# Patient Record
Sex: Female | Born: 1955 | Race: White | Hispanic: No | Marital: Married | Smoking: Former smoker
Health system: Southern US, Community
[De-identification: ages and names within clinical notes are randomized; demographics above are authoritative.]

## PROBLEM LIST (undated history)

## (undated) DIAGNOSIS — R51 Headache: Secondary | ICD-10-CM

## (undated) DIAGNOSIS — I739 Peripheral vascular disease, unspecified: Secondary | ICD-10-CM

## (undated) DIAGNOSIS — I700 Atherosclerosis of aorta: Secondary | ICD-10-CM

## (undated) DIAGNOSIS — K219 Gastro-esophageal reflux disease without esophagitis: Secondary | ICD-10-CM

## (undated) DIAGNOSIS — I2510 Atherosclerotic heart disease of native coronary artery without angina pectoris: Secondary | ICD-10-CM

## (undated) DIAGNOSIS — E785 Hyperlipidemia, unspecified: Secondary | ICD-10-CM

## (undated) DIAGNOSIS — F17200 Nicotine dependence, unspecified, uncomplicated: Secondary | ICD-10-CM

## (undated) DIAGNOSIS — M1990 Unspecified osteoarthritis, unspecified site: Secondary | ICD-10-CM

## (undated) DIAGNOSIS — E1159 Type 2 diabetes mellitus with other circulatory complications: Secondary | ICD-10-CM

## (undated) DIAGNOSIS — K5790 Diverticulosis of intestine, part unspecified, without perforation or abscess without bleeding: Secondary | ICD-10-CM

## (undated) DIAGNOSIS — J449 Chronic obstructive pulmonary disease, unspecified: Secondary | ICD-10-CM

## (undated) DIAGNOSIS — R519 Headache, unspecified: Secondary | ICD-10-CM

## (undated) DIAGNOSIS — J45909 Unspecified asthma, uncomplicated: Secondary | ICD-10-CM

## (undated) DIAGNOSIS — I701 Atherosclerosis of renal artery: Secondary | ICD-10-CM

## (undated) DIAGNOSIS — K859 Acute pancreatitis without necrosis or infection, unspecified: Secondary | ICD-10-CM

## (undated) DIAGNOSIS — T8859XA Other complications of anesthesia, initial encounter: Secondary | ICD-10-CM

## (undated) DIAGNOSIS — I10 Essential (primary) hypertension: Secondary | ICD-10-CM

## (undated) DIAGNOSIS — T4145XA Adverse effect of unspecified anesthetic, initial encounter: Secondary | ICD-10-CM

## (undated) HISTORY — DX: Unspecified osteoarthritis, unspecified site: M19.90

## (undated) HISTORY — DX: Chronic obstructive pulmonary disease, unspecified: J44.9

## (undated) HISTORY — DX: Hyperlipidemia, unspecified: E78.5

## (undated) HISTORY — DX: Diverticulosis of intestine, part unspecified, without perforation or abscess without bleeding: K57.90

## (undated) HISTORY — DX: Atherosclerosis of aorta: I70.0

## (undated) HISTORY — PX: CARDIAC CATHETERIZATION: SHX172

## (undated) HISTORY — DX: Atherosclerosis of renal artery: I70.1

## (undated) HISTORY — DX: Type 2 diabetes mellitus with other circulatory complications: E11.59

## (undated) HISTORY — DX: Acute pancreatitis without necrosis or infection, unspecified: K85.90

## (undated) HISTORY — PX: FOOT SURGERY: SHX648

## (undated) HISTORY — DX: Peripheral vascular disease, unspecified: I73.9

## (undated) HISTORY — DX: Atherosclerotic heart disease of native coronary artery without angina pectoris: I25.10

## (undated) HISTORY — DX: Nicotine dependence, unspecified, uncomplicated: F17.200

---

## 1978-02-14 HISTORY — PX: TUBAL LIGATION: SHX77

## 1997-11-05 ENCOUNTER — Emergency Department (HOSPITAL_COMMUNITY): Admission: EM | Admit: 1997-11-05 | Discharge: 1997-11-05 | Payer: Self-pay | Admitting: Emergency Medicine

## 1998-05-15 ENCOUNTER — Ambulatory Visit (HOSPITAL_COMMUNITY): Admission: RE | Admit: 1998-05-15 | Discharge: 1998-05-15 | Payer: Self-pay | Admitting: Family Medicine

## 1998-05-15 ENCOUNTER — Encounter: Payer: Self-pay | Admitting: Family Medicine

## 1998-05-28 ENCOUNTER — Ambulatory Visit (HOSPITAL_COMMUNITY): Admission: RE | Admit: 1998-05-28 | Discharge: 1998-05-28 | Payer: Self-pay | Admitting: Family Medicine

## 1998-05-28 ENCOUNTER — Encounter: Payer: Self-pay | Admitting: Family Medicine

## 1999-02-25 ENCOUNTER — Ambulatory Visit (HOSPITAL_COMMUNITY): Admission: RE | Admit: 1999-02-25 | Discharge: 1999-02-25 | Payer: Self-pay | Admitting: Internal Medicine

## 1999-02-25 ENCOUNTER — Encounter: Payer: Self-pay | Admitting: Internal Medicine

## 1999-10-28 ENCOUNTER — Inpatient Hospital Stay (HOSPITAL_COMMUNITY): Admission: EM | Admit: 1999-10-28 | Discharge: 1999-11-01 | Payer: Self-pay | Admitting: Emergency Medicine

## 1999-10-29 ENCOUNTER — Encounter: Payer: Self-pay | Admitting: Internal Medicine

## 2000-05-23 ENCOUNTER — Inpatient Hospital Stay (HOSPITAL_COMMUNITY): Admission: EM | Admit: 2000-05-23 | Discharge: 2000-05-27 | Payer: Self-pay | Admitting: Emergency Medicine

## 2000-05-23 ENCOUNTER — Encounter: Payer: Self-pay | Admitting: Emergency Medicine

## 2001-02-14 DIAGNOSIS — K859 Acute pancreatitis without necrosis or infection, unspecified: Secondary | ICD-10-CM

## 2001-02-14 HISTORY — DX: Acute pancreatitis without necrosis or infection, unspecified: K85.90

## 2001-04-30 ENCOUNTER — Emergency Department (HOSPITAL_COMMUNITY): Admission: EM | Admit: 2001-04-30 | Discharge: 2001-04-30 | Payer: Self-pay | Admitting: Emergency Medicine

## 2001-06-19 ENCOUNTER — Emergency Department (HOSPITAL_COMMUNITY): Admission: EM | Admit: 2001-06-19 | Discharge: 2001-06-19 | Payer: Self-pay | Admitting: Emergency Medicine

## 2001-08-25 ENCOUNTER — Emergency Department (HOSPITAL_COMMUNITY): Admission: EM | Admit: 2001-08-25 | Discharge: 2001-08-25 | Payer: Self-pay | Admitting: Emergency Medicine

## 2001-09-15 ENCOUNTER — Emergency Department (HOSPITAL_COMMUNITY): Admission: EM | Admit: 2001-09-15 | Discharge: 2001-09-16 | Payer: Self-pay

## 2002-01-14 ENCOUNTER — Encounter: Payer: Self-pay | Admitting: Emergency Medicine

## 2002-01-14 ENCOUNTER — Emergency Department (HOSPITAL_COMMUNITY): Admission: EM | Admit: 2002-01-14 | Discharge: 2002-01-14 | Payer: Self-pay | Admitting: Emergency Medicine

## 2002-01-15 ENCOUNTER — Inpatient Hospital Stay (HOSPITAL_COMMUNITY): Admission: EM | Admit: 2002-01-15 | Discharge: 2002-01-16 | Payer: Self-pay | Admitting: Emergency Medicine

## 2002-01-16 ENCOUNTER — Encounter: Payer: Self-pay | Admitting: Internal Medicine

## 2005-01-01 ENCOUNTER — Emergency Department (HOSPITAL_COMMUNITY): Admission: EM | Admit: 2005-01-01 | Discharge: 2005-01-01 | Payer: Self-pay | Admitting: Emergency Medicine

## 2005-04-11 ENCOUNTER — Emergency Department (HOSPITAL_COMMUNITY): Admission: EM | Admit: 2005-04-11 | Discharge: 2005-04-12 | Payer: Self-pay | Admitting: Emergency Medicine

## 2006-11-22 ENCOUNTER — Ambulatory Visit: Payer: Self-pay | Admitting: Family Medicine

## 2006-11-22 ENCOUNTER — Encounter (INDEPENDENT_AMBULATORY_CARE_PROVIDER_SITE_OTHER): Payer: Self-pay | Admitting: Nurse Practitioner

## 2006-11-22 LAB — CONVERTED CEMR LAB
BUN: 12 mg/dL (ref 6–23)
CO2: 23 meq/L (ref 19–32)
Calcium: 9.9 mg/dL (ref 8.4–10.5)
Chloride: 100 meq/L (ref 96–112)
Cholesterol: 380 mg/dL — ABNORMAL HIGH (ref 0–200)
Creatinine, Ser: 0.66 mg/dL (ref 0.40–1.20)
HDL: 44 mg/dL (ref >39)
Hemoglobin: 16.8 g/dL — ABNORMAL HIGH (ref 12.0–15.0)
Lymphocytes Relative: 34 % (ref 12–46)
MCHC: 33.5 g/dL (ref 30.0–36.0)
Monocytes Absolute: 0.3 10*3/uL (ref 0.2–0.7)
Monocytes Relative: 4 % (ref 3–11)
Neutro Abs: 4.2 10*3/uL (ref 1.7–7.7)
RBC: 5.6 M/uL — ABNORMAL HIGH (ref 3.87–5.11)
Total CHOL/HDL Ratio: 8.6

## 2006-11-23 ENCOUNTER — Ambulatory Visit: Payer: Self-pay | Admitting: *Deleted

## 2006-11-24 ENCOUNTER — Encounter (INDEPENDENT_AMBULATORY_CARE_PROVIDER_SITE_OTHER): Payer: Self-pay | Admitting: Nurse Practitioner

## 2006-11-29 ENCOUNTER — Ambulatory Visit (HOSPITAL_COMMUNITY): Admission: RE | Admit: 2006-11-29 | Discharge: 2006-11-29 | Payer: Self-pay | Admitting: Nurse Practitioner

## 2006-11-29 ENCOUNTER — Ambulatory Visit (HOSPITAL_COMMUNITY): Admission: RE | Admit: 2006-11-29 | Discharge: 2006-11-29 | Payer: Self-pay | Admitting: Family Medicine

## 2006-12-05 ENCOUNTER — Encounter: Admission: RE | Admit: 2006-12-05 | Discharge: 2006-12-05 | Payer: Self-pay | Admitting: Family Medicine

## 2007-03-20 ENCOUNTER — Ambulatory Visit: Payer: Self-pay | Admitting: Family Medicine

## 2007-05-10 ENCOUNTER — Ambulatory Visit: Payer: Self-pay | Admitting: Internal Medicine

## 2007-08-22 ENCOUNTER — Ambulatory Visit: Payer: Self-pay | Admitting: Internal Medicine

## 2007-08-22 LAB — CONVERTED CEMR LAB
CRP: 0.2 mg/dL (ref <0.6)
HDL: 44 mg/dL (ref >39)
LDL Cholesterol: 156 mg/dL — ABNORMAL HIGH (ref 0–99)
Total CHOL/HDL Ratio: 5.6

## 2007-10-02 ENCOUNTER — Ambulatory Visit: Payer: Self-pay | Admitting: Family Medicine

## 2007-11-30 ENCOUNTER — Ambulatory Visit: Payer: Self-pay | Admitting: Internal Medicine

## 2008-01-08 ENCOUNTER — Encounter (INDEPENDENT_AMBULATORY_CARE_PROVIDER_SITE_OTHER): Payer: Self-pay | Admitting: Internal Medicine

## 2008-01-08 ENCOUNTER — Ambulatory Visit: Payer: Self-pay | Admitting: Internal Medicine

## 2008-01-08 LAB — CONVERTED CEMR LAB
AST: 17 units/L (ref 0–37)
BUN: 18 mg/dL (ref 6–23)
Calcium: 9.8 mg/dL (ref 8.4–10.5)
Chloride: 103 meq/L (ref 96–112)
Creatinine, Ser: 0.75 mg/dL (ref 0.40–1.20)
HDL: 42 mg/dL (ref >39)
Total CHOL/HDL Ratio: 5

## 2008-02-13 ENCOUNTER — Ambulatory Visit (HOSPITAL_COMMUNITY): Admission: RE | Admit: 2008-02-13 | Discharge: 2008-02-13 | Payer: Self-pay | Admitting: Internal Medicine

## 2008-05-02 ENCOUNTER — Ambulatory Visit: Payer: Self-pay | Admitting: Family Medicine

## 2008-07-08 ENCOUNTER — Ambulatory Visit: Payer: Self-pay | Admitting: Family Medicine

## 2008-09-24 ENCOUNTER — Ambulatory Visit: Payer: Self-pay | Admitting: Internal Medicine

## 2008-09-24 ENCOUNTER — Encounter (INDEPENDENT_AMBULATORY_CARE_PROVIDER_SITE_OTHER): Payer: Self-pay | Admitting: Internal Medicine

## 2008-09-24 LAB — CONVERTED CEMR LAB
Alkaline Phosphatase: 77 units/L (ref 39–117)
BUN: 26 mg/dL — ABNORMAL HIGH (ref 6–23)
Creatinine, Ser: 1.04 mg/dL (ref 0.40–1.20)
Glucose, Bld: 97 mg/dL (ref 70–99)
HDL: 41 mg/dL (ref >39)
LDL Cholesterol: 108 mg/dL — ABNORMAL HIGH (ref 0–99)
Sodium: 137 meq/L (ref 135–145)
Total Bilirubin: 0.2 mg/dL — ABNORMAL LOW (ref 0.3–1.2)
Total CHOL/HDL Ratio: 4.4
Total Protein: 7.1 g/dL (ref 6.0–8.3)
Triglycerides: 163 mg/dL — ABNORMAL HIGH (ref <150)
VLDL: 33 mg/dL (ref 0–40)

## 2008-09-25 ENCOUNTER — Ambulatory Visit: Payer: Self-pay | Admitting: Internal Medicine

## 2008-09-25 ENCOUNTER — Encounter (INDEPENDENT_AMBULATORY_CARE_PROVIDER_SITE_OTHER): Payer: Self-pay | Admitting: Internal Medicine

## 2008-09-25 LAB — CONVERTED CEMR LAB
Calcium: 9.7 mg/dL (ref 8.4–10.5)
Creatinine, Ser: 1 mg/dL (ref 0.40–1.20)

## 2008-12-08 ENCOUNTER — Encounter: Payer: Self-pay | Admitting: Cardiology

## 2008-12-08 ENCOUNTER — Encounter (INDEPENDENT_AMBULATORY_CARE_PROVIDER_SITE_OTHER): Payer: Self-pay | Admitting: Internal Medicine

## 2008-12-08 ENCOUNTER — Ambulatory Visit: Payer: Self-pay | Admitting: Internal Medicine

## 2008-12-08 LAB — CONVERTED CEMR LAB
Chloride: 102 meq/L (ref 96–112)
Cholesterol: 182 mg/dL (ref 0–200)
HDL: 47 mg/dL (ref >39)
LDL Cholesterol: 98 mg/dL (ref 0–99)
Potassium: 5.2 meq/L (ref 3.5–5.3)
Sodium: 136 meq/L (ref 135–145)
Triglycerides: 187 mg/dL — ABNORMAL HIGH (ref <150)
VLDL: 37 mg/dL (ref 0–40)

## 2008-12-09 ENCOUNTER — Encounter (INDEPENDENT_AMBULATORY_CARE_PROVIDER_SITE_OTHER): Payer: Self-pay | Admitting: Internal Medicine

## 2009-01-02 DIAGNOSIS — I10 Essential (primary) hypertension: Secondary | ICD-10-CM | POA: Insufficient documentation

## 2009-01-02 DIAGNOSIS — R61 Generalized hyperhidrosis: Secondary | ICD-10-CM | POA: Insufficient documentation

## 2009-01-02 DIAGNOSIS — R079 Chest pain, unspecified: Secondary | ICD-10-CM | POA: Insufficient documentation

## 2009-01-02 DIAGNOSIS — E119 Type 2 diabetes mellitus without complications: Secondary | ICD-10-CM | POA: Insufficient documentation

## 2009-01-02 DIAGNOSIS — F17200 Nicotine dependence, unspecified, uncomplicated: Secondary | ICD-10-CM | POA: Insufficient documentation

## 2009-01-05 ENCOUNTER — Ambulatory Visit: Payer: Self-pay | Admitting: Cardiology

## 2009-01-05 DIAGNOSIS — R0602 Shortness of breath: Secondary | ICD-10-CM | POA: Insufficient documentation

## 2009-01-20 ENCOUNTER — Telehealth (INDEPENDENT_AMBULATORY_CARE_PROVIDER_SITE_OTHER): Payer: Self-pay | Admitting: *Deleted

## 2009-01-21 ENCOUNTER — Encounter: Payer: Self-pay | Admitting: Cardiology

## 2009-01-21 ENCOUNTER — Ambulatory Visit: Payer: Self-pay | Admitting: Cardiology

## 2009-01-21 ENCOUNTER — Ambulatory Visit: Payer: Self-pay

## 2009-01-21 ENCOUNTER — Encounter (HOSPITAL_COMMUNITY): Admission: RE | Admit: 2009-01-21 | Discharge: 2009-02-11 | Payer: Self-pay | Admitting: Cardiovascular Disease

## 2009-01-30 ENCOUNTER — Ambulatory Visit: Payer: Self-pay | Admitting: Cardiology

## 2009-01-30 DIAGNOSIS — I739 Peripheral vascular disease, unspecified: Secondary | ICD-10-CM | POA: Insufficient documentation

## 2009-02-03 ENCOUNTER — Encounter: Payer: Self-pay | Admitting: Cardiology

## 2009-02-04 ENCOUNTER — Telehealth: Payer: Self-pay | Admitting: Cardiology

## 2009-02-11 ENCOUNTER — Ambulatory Visit: Payer: Self-pay | Admitting: Cardiology

## 2009-02-12 LAB — CONVERTED CEMR LAB
BUN: 17 mg/dL (ref 6–23)
Basophils Absolute: 0.1 10*3/uL (ref 0.0–0.1)
Creatinine, Ser: 1 mg/dL (ref 0.4–1.2)
Eosinophils Relative: 7.3 % — ABNORMAL HIGH (ref 0.0–5.0)
GFR calc non Af Amer: 61.91 mL/min (ref >60)
Glucose, Bld: 79 mg/dL (ref 70–99)
INR: 0.9 (ref 0.8–1.0)
Lymphs Abs: 2.9 10*3/uL (ref 0.7–4.0)
MCV: 90.9 fL (ref 78.0–100.0)
Monocytes Absolute: 0.6 10*3/uL (ref 0.1–1.0)
Neutrophils Relative %: 49.5 % (ref 43.0–77.0)
Platelets: 423 10*3/uL — ABNORMAL HIGH (ref 150.0–400.0)
Prothrombin Time: 9.3 s (ref 9.1–11.7)
RDW: 13.2 % (ref 11.5–14.6)
WBC: 8.2 10*3/uL (ref 4.5–10.5)

## 2009-02-14 HISTORY — PX: TOTAL KNEE ARTHROPLASTY: SHX125

## 2009-02-18 ENCOUNTER — Telehealth: Payer: Self-pay | Admitting: Cardiology

## 2009-02-18 ENCOUNTER — Ambulatory Visit (HOSPITAL_COMMUNITY): Admission: AD | Admit: 2009-02-18 | Discharge: 2009-02-18 | Payer: Self-pay | Admitting: Cardiology

## 2009-02-18 ENCOUNTER — Ambulatory Visit: Payer: Self-pay | Admitting: Cardiology

## 2009-02-23 ENCOUNTER — Ambulatory Visit: Payer: Self-pay | Admitting: Cardiology

## 2009-02-23 ENCOUNTER — Ambulatory Visit: Payer: Self-pay | Admitting: Cardiovascular Disease

## 2009-02-23 DIAGNOSIS — I739 Peripheral vascular disease, unspecified: Secondary | ICD-10-CM | POA: Insufficient documentation

## 2009-02-26 LAB — CONVERTED CEMR LAB
BUN: 18 mg/dL (ref 6–23)
Calcium: 9.7 mg/dL (ref 8.4–10.5)
Chloride: 105 meq/L (ref 96–112)
Creatinine, Ser: 0.9 mg/dL (ref 0.4–1.2)
GFR calc non Af Amer: 69.91 mL/min (ref >60)

## 2009-03-02 ENCOUNTER — Ambulatory Visit: Payer: Self-pay | Admitting: Cardiology

## 2009-03-02 DIAGNOSIS — I2510 Atherosclerotic heart disease of native coronary artery without angina pectoris: Secondary | ICD-10-CM | POA: Insufficient documentation

## 2009-03-03 ENCOUNTER — Telehealth (INDEPENDENT_AMBULATORY_CARE_PROVIDER_SITE_OTHER): Payer: Self-pay | Admitting: *Deleted

## 2009-03-03 LAB — CONVERTED CEMR LAB
Basophils Absolute: 0.1 10*3/uL (ref 0.0–0.1)
Basophils Relative: 1.7 % (ref 0.0–3.0)
Calcium: 9.8 mg/dL (ref 8.4–10.5)
Eosinophils Relative: 8.7 % — ABNORMAL HIGH (ref 0.0–5.0)
GFR calc non Af Amer: 80.08 mL/min (ref >60)
Glucose, Bld: 109 mg/dL — ABNORMAL HIGH (ref 70–99)
HCT: 37.5 % (ref 36.0–46.0)
Hemoglobin: 12.5 g/dL (ref 12.0–15.0)
INR: 0.9 (ref 0.8–1.0)
Lymphocytes Relative: 31.9 % (ref 12.0–46.0)
Lymphs Abs: 2.1 10*3/uL (ref 0.7–4.0)
Monocytes Relative: 6.8 % (ref 3.0–12.0)
Neutro Abs: 3.3 10*3/uL (ref 1.4–7.7)
Potassium: 5.4 meq/L — ABNORMAL HIGH (ref 3.5–5.1)
RBC: 4.14 M/uL (ref 3.87–5.11)
RDW: 12.4 % (ref 11.5–14.6)
Sodium: 142 meq/L (ref 135–145)
WBC: 6.5 10*3/uL (ref 4.5–10.5)

## 2009-03-10 ENCOUNTER — Ambulatory Visit: Payer: Self-pay | Admitting: Internal Medicine

## 2009-03-11 ENCOUNTER — Ambulatory Visit (HOSPITAL_COMMUNITY): Admission: RE | Admit: 2009-03-11 | Discharge: 2009-03-11 | Payer: Self-pay | Admitting: Cardiovascular Disease

## 2009-03-11 ENCOUNTER — Ambulatory Visit: Payer: Self-pay | Admitting: Cardiovascular Disease

## 2009-03-16 ENCOUNTER — Ambulatory Visit: Payer: Self-pay | Admitting: Cardiology

## 2009-03-16 ENCOUNTER — Encounter: Payer: Self-pay | Admitting: Cardiovascular Disease

## 2009-03-18 LAB — CONVERTED CEMR LAB
CO2: 24 meq/L (ref 19–32)
Calcium: 10 mg/dL (ref 8.4–10.5)
Glucose, Bld: 128 mg/dL — ABNORMAL HIGH (ref 70–99)
Potassium: 5 meq/L (ref 3.5–5.1)
Sodium: 138 meq/L (ref 135–145)

## 2009-03-23 ENCOUNTER — Ambulatory Visit: Payer: Self-pay | Admitting: Cardiovascular Disease

## 2009-03-23 LAB — CONVERTED CEMR LAB
BUN: 22 mg/dL (ref 6–23)
Basophils Relative: 1.4 % (ref 0.0–3.0)
CO2: 27 meq/L (ref 19–32)
Chloride: 107 meq/L (ref 96–112)
Eosinophils Absolute: 0.7 10*3/uL (ref 0.0–0.7)
Eosinophils Relative: 9 % — ABNORMAL HIGH (ref 0.0–5.0)
HCT: 35.9 % — ABNORMAL LOW (ref 36.0–46.0)
Lymphs Abs: 2.3 10*3/uL (ref 0.7–4.0)
MCHC: 33.4 g/dL (ref 30.0–36.0)
MCV: 90.7 fL (ref 78.0–100.0)
Monocytes Absolute: 0.6 10*3/uL (ref 0.1–1.0)
Platelets: 372 10*3/uL (ref 150.0–400.0)
Potassium: 4.8 meq/L (ref 3.5–5.1)
RBC: 3.96 M/uL (ref 3.87–5.11)
WBC: 7.9 10*3/uL (ref 4.5–10.5)

## 2009-03-25 ENCOUNTER — Inpatient Hospital Stay (HOSPITAL_COMMUNITY): Admission: AD | Admit: 2009-03-25 | Discharge: 2009-03-27 | Payer: Self-pay | Admitting: Cardiovascular Disease

## 2009-03-25 ENCOUNTER — Ambulatory Visit: Payer: Self-pay | Admitting: Cardiovascular Disease

## 2009-03-26 ENCOUNTER — Encounter: Payer: Self-pay | Admitting: Cardiovascular Disease

## 2009-04-03 ENCOUNTER — Telehealth: Payer: Self-pay | Admitting: Cardiology

## 2009-04-06 ENCOUNTER — Encounter: Payer: Self-pay | Admitting: Cardiology

## 2009-04-09 ENCOUNTER — Encounter: Payer: Self-pay | Admitting: Cardiovascular Disease

## 2009-04-10 ENCOUNTER — Ambulatory Visit: Payer: Self-pay

## 2009-04-10 ENCOUNTER — Encounter: Payer: Self-pay | Admitting: Cardiovascular Disease

## 2009-04-13 ENCOUNTER — Telehealth: Payer: Self-pay | Admitting: Cardiology

## 2009-04-13 ENCOUNTER — Ambulatory Visit: Payer: Self-pay | Admitting: Cardiovascular Disease

## 2009-04-16 ENCOUNTER — Ambulatory Visit: Payer: Self-pay | Admitting: Cardiology

## 2009-04-22 LAB — CONVERTED CEMR LAB
Albumin: 3.9 g/dL (ref 3.5–5.2)
HDL: 49.9 mg/dL (ref >39.00)
LDL Cholesterol: 61 mg/dL (ref 0–99)
Total CHOL/HDL Ratio: 3
Triglycerides: 97 mg/dL (ref 0.0–149.0)
VLDL: 19.4 mg/dL (ref 0.0–40.0)

## 2009-04-28 ENCOUNTER — Encounter: Payer: Self-pay | Admitting: Cardiovascular Disease

## 2009-05-15 ENCOUNTER — Telehealth (INDEPENDENT_AMBULATORY_CARE_PROVIDER_SITE_OTHER): Payer: Self-pay | Admitting: *Deleted

## 2009-05-18 ENCOUNTER — Telehealth: Payer: Self-pay | Admitting: Cardiology

## 2009-06-01 ENCOUNTER — Ambulatory Visit: Payer: Self-pay | Admitting: Cardiology

## 2009-06-12 ENCOUNTER — Encounter: Payer: Self-pay | Admitting: Cardiology

## 2009-06-15 ENCOUNTER — Telehealth: Payer: Self-pay | Admitting: Cardiology

## 2009-06-17 ENCOUNTER — Ambulatory Visit (HOSPITAL_COMMUNITY): Admission: RE | Admit: 2009-06-17 | Discharge: 2009-06-17 | Payer: Self-pay | Admitting: Physician Assistant

## 2009-06-23 ENCOUNTER — Telehealth: Payer: Self-pay | Admitting: Cardiology

## 2009-07-15 ENCOUNTER — Inpatient Hospital Stay (HOSPITAL_COMMUNITY): Admission: RE | Admit: 2009-07-15 | Discharge: 2009-07-18 | Payer: Self-pay | Admitting: Orthopedic Surgery

## 2009-08-11 ENCOUNTER — Encounter
Admission: RE | Admit: 2009-08-11 | Discharge: 2009-09-10 | Payer: Self-pay | Source: Home / Self Care | Admitting: Orthopedic Surgery

## 2009-08-14 ENCOUNTER — Encounter: Admission: RE | Admit: 2009-08-14 | Discharge: 2009-10-06 | Payer: Self-pay | Admitting: Orthopedic Surgery

## 2009-08-28 ENCOUNTER — Telehealth: Payer: Self-pay | Admitting: Cardiology

## 2009-08-31 ENCOUNTER — Telehealth: Payer: Self-pay | Admitting: Cardiology

## 2009-09-14 ENCOUNTER — Ambulatory Visit: Payer: Self-pay | Admitting: Internal Medicine

## 2009-10-13 ENCOUNTER — Encounter: Payer: Self-pay | Admitting: Cardiovascular Disease

## 2009-10-14 ENCOUNTER — Ambulatory Visit: Payer: Self-pay | Admitting: Cardiovascular Disease

## 2009-10-15 ENCOUNTER — Ambulatory Visit: Payer: Self-pay | Admitting: Cardiovascular Disease

## 2009-11-27 ENCOUNTER — Encounter (INDEPENDENT_AMBULATORY_CARE_PROVIDER_SITE_OTHER): Payer: Self-pay | Admitting: *Deleted

## 2009-11-27 LAB — CONVERTED CEMR LAB
INR: 1.01 (ref <1.50)
Prothrombin Time: 13.5 s (ref 11.6–15.2)

## 2009-12-02 ENCOUNTER — Encounter: Payer: Self-pay | Admitting: Cardiology

## 2009-12-02 ENCOUNTER — Ambulatory Visit: Payer: Self-pay | Admitting: Cardiology

## 2009-12-03 ENCOUNTER — Ambulatory Visit (HOSPITAL_COMMUNITY): Admission: RE | Admit: 2009-12-03 | Discharge: 2009-12-03 | Payer: Self-pay | Admitting: Family Medicine

## 2009-12-07 LAB — CONVERTED CEMR LAB
Bilirubin, Direct: 0 mg/dL (ref 0.0–0.3)
Total Bilirubin: 0.2 mg/dL — ABNORMAL LOW (ref 0.3–1.2)
Total Protein: 7.1 g/dL (ref 6.0–8.3)

## 2010-03-16 NOTE — Progress Notes (Signed)
Summary: returning call regarding call about meds  Phone Note Call from Patient Call back at Home Phone [phone]   Caller: Mom Summary of Call: Pt returning call regarding meds Initial call taken by: Judie Grieve,  August 31, 2009 2:37 PM  Follow-up for Phone Call        SEE PREVIOUS NOTE  FROM DR [facility]  SAMPLES OF CRESTOR LEFT AT FRONT DESK FOR PICK UP PT AWARE. Follow-up by: Scherrie Bateman, LPN,  August 31, 2009 2:58 PM

## 2010-03-16 NOTE — Assessment & Plan Note (Signed)
Summary: rov   Primary Provider:  Tyler Deis Id-Din   History of Present Illness: 55 yo with history of HTN, DM2, hyperlipidemia and smoking presents for followup of chest pain and right leg pain after recent left heart cath.  Patient has had sharp left-sided chest pain around twice monthly for the last 2 years.  It has been somewhat more frequent recently.  Pain is not exertional.  Patient also has pain her right leg, primarily the calf, simply with walking in her house.  She also gets pain in her right foot at night.  No ulcers on the foot.  She additionally has right knee pain.  Her left leg does not bother her.  She has been able to quit smoking using Chantix but is off Chantix now and having more urges.  She also says that her exertional shortness of breath is improved with Advair use.   She had a stress myoview showing apical ischemia so I did a left heart cath.  This showed diffuse branch vessel disease, the worst being in a large third diagonal.  This vessel extended down to near the apex and may have caused the defect on myoview.  The vessel had severe diffuse disease and no target for intervention so we are managing medically.  She has had minimal CP since the procedure.  I have started her on Imdur 30 mg daily in addition to her other meds.  She will be going for peripheral angiogram with Dr. Clifton James.   She has chronic headaches and takes a lot of Goody's Powder.    BP is elevated to 141/111  Labs (1/11): creatinine 0.9, K 4.5  ECG: NSR at 108, otherwise normal  Current Medications (verified): 1)  Metformin Hcl 500 Mg Tabs (Metformin Hcl) .... Take One Tablet Two Times A Day 2)  Naproxen 500 Mg Tabs (Naproxen) .... 2 Tablets Daily 3)  Lisinopril-Hydrochlorothiazide 20-12.5 Mg Tabs (Lisinopril-Hydrochlorothiazide) .... Take Two Tablets Daily 4)  Crestor 20 Mg Tabs (Rosuvastatin Calcium) .... One Tablet Daily 5)  Hydrocodone-Acetaminophen 5-500 Mg Tabs (Hydrocodone-Acetaminophen) .... Take  One Tablet Every 6 Hours As Needed 6)  Aspirin 81 Mg Tabs (Aspirin) .... Once Daily 7)  Fish Oil 1000 Mg Caps (Omega-3 Fatty Acids) .... Take One Capsule Once Daily 8)  Advair Diskus 250-50 Mcg/dose Aepb (Fluticasone-Salmeterol) .... Use 1 Puff Twice A Day 9)  Plavix 75 Mg Tabs (Clopidogrel Bisulfate) .... Four  Tablets  Daily For 1 Day The One Tablet Daily 10)  Imdur 30 Mg Xr24h-Tab (Isosorbide Mononitrate) .... One Tablet Daily 11)  Ventolin Hfa 108 (90 Base) Mcg/act Aers (Albuterol Sulfate) .... Two Times A Day 12)  Tramadol Hcl 50 Mg Tabs (Tramadol Hcl) .... One Tablet Twice A Day As Needed For Pain  Allergies (verified): 1)  ! * Tetanus 2)  ! * Latex 3)  ! * Milk  Past History:  Past Medical History: 1. CAD: ETT-myoview (12/10) with apical anterior ischemia, EF 75%.  LHC (1/11) showed diffuse disease.  There was a large D3 with serial 70%, 80-90%, and 70% stenoses then severe diffuse distal vessel disease.  Small to moderate OM1 with severe diffuse disease, 50% mRCA, 95% mid small PLV, 60% PDA.  EF 60%.  2. DYSLIPIDEMIA (ICD-272.4) 3. HYPERTENSION (ICD-401.9) 4. DIABETES MELLITUS, TYPE II (ICD-250.00) 5. TOBACCO ABUSE (ICD-305.1): Quit 11/10.  6. Severe osteoarthritis right knee 7. Asthma/COPD 8. PAD: Right leg claudication with arterial dopplers (12/10) showing >50% right SFA stenosis.     Family History: Reviewed history  from 02/23/2009 and no changes required. Mother alive with CVA Father deceased with emphysema  Social History: Reviewed history from 02/23/2009 and no changes required. No etoh-used to drink. Denies any IV drug use or marijuana use.   Former smoker for 30 years, quit November 2010. She is married with two children. Disabled from knee, not working.    Review of Systems       All systems reviewed and negative except as per HPI.   Vital Signs:  Patient profile:   55 year old female Height:      62.5 inches Weight:      162 pounds BMI:      29.26 Pulse rate:   108 / minute Resp:     16 per minute BP sitting:   141 / 111  (right arm)  Vitals Entered By: Marrion Coy, CNA (March 02, 2009 8:05 AM)  Physical Exam  General:  Well developed, well nourished, in no acute distress. Neck:  Neck supple, no JVD. No masses, thyromegaly or abnormal cervical nodes. Lungs:  Somewhat distant breath sounds, no wheezes.  Heart:  Non-displaced PMI, chest non-tender; regular rate and rhythm, S1, S2 without murmurs, rubs or gallops. Carotid upstroke normal, no bruit. Unable to palpate right PT pulses but foot is warm without ulcers.  The right DP pulse is 1+.  Left pedal pulses normal. No edema, no varicosities. Abdomen:  Bowel sounds positive; abdomen soft and non-tender without masses, organomegaly, or hernias noted. No hepatosplenomegaly. Extremities:  No clubbing or cyanosis. Neurologic:  Alert and oriented x 3. Psych:  Normal affect.   Impression & Recommendations:  Problem # 1:  CAD (ICD-414.00) Patient has diffuse branch vessel disease on catheterization.  I suspect that the apical ischemia on myoview was related to the severely diseased large 3rd diagonal that extends down toward the apex along with the distal LAD.  There was no good target for intervention given diffuse nature of disease.  I will manage her CAD medically.  She is now on Imdur and has not had significant CP since the cath.  She is not on a beta blocker because of history of asthma/COPD though in the future I will probably try to initiate bisoprolol.  Continue ACEI, Crestor, ASA, and Plavix.    Problem # 2:  PVD (ICD-443.9) For peripheral angiogram with Dr. Clifton James.   Problem # 3:  HYPERTENSION (ICD-401.9) Increase lisinopril/HCT to 40/25 daily, BMET and BP check in 2 wks.    Problem # 4:  DYSLIPIDEMIA (ICD-272.4) Goal LDL < 70.  Followup lipids/LFTs on Crestor in 2 months.   Problem # 5:  TOBACCO ABUSE (ICD-305.1) She is staying off cigarettes but getting more  urges.  Will give her another course of Chantix.   I will see her back in 3 months.   I will give her a prescription for tramadol to use instead of Goody Powders for her headaches.   Patient Instructions: 1)  Your physician has recommended you make the following change in your medication:  2)  Increase Lisinopril/HCT to 40/25mg  daily---this will be two Lisinopril /HCT 20/12.5mg  tablets daily 3)  Chantix 1mg  daily for 5 days the one twice a day 4)  Use Tramadol 50mg  twice a day as needed for pain--DO NOT USE GOODY POWDER 5)  Take and record your blood pressure and pulse---I will call you in 2 weeks to get the readings Luana Shu [phone]  6)  Your physician recommends that you return for lab work in: 2 weeks--BMP  414.01 401.9 7)  Your physician recommends that you return for a FASTING lipid profile/liver profile in 2 months 414.01 401.9 8)  Your physician recommends that you schedule a follow-up appointment in: 3 months with Dr. Marca Ancona  Prescriptions: TRAMADOL HCL 50 MG TABS (TRAMADOL HCL) one tablet twice a day as needed for pain  #30 x 3   Entered by:   Katina Dung, RN, BSN   Authorized by:   Marca Ancona, MD   Signed by:   Katina Dung, RN, BSN on 03/02/2009   Method used:   Faxed to ...       [facility] - [facility] (retail)       [address] [place], [place]  [address]       Ph: [phone]       Fax: [phone]   RxID:   [id number] LISINOPRIL-HYDROCHLOROTHIAZIDE 20-12.5 MG TABS (LISINOPRIL-HYDROCHLOROTHIAZIDE) take two tablets daily  #60 x 6   Entered by:   Katina Dung, RN, BSN   Authorized by:   Marca Ancona, MD   Signed by:   Katina Dung, RN, BSN on 03/02/2009   Method used:   Faxed to ...       [facility] - [facility] (retail)       [address] [place], [place]  [address]       Ph: [phone]       Fax: [phone]   RxID:   [id number]

## 2010-03-16 NOTE — Progress Notes (Signed)
Summary: B/P readings  Phone Note Outgoing Call   Call placed by: Katina Dung, RN, BSN,  April 03, 2009 3:09 PM Call placed to: Patient Summary of Call: B/P readings  Follow-up for Phone Call        called pt to get B/P readings--pt had PV procedure 03-25-09   --she said she thought it was OK while she was in the hospital but she has not checked it --she has an appointment with Dr Clifton James 04-13-09, she will get her B/P checked and bring the readings to the appointment with Dr Clifton James

## 2010-03-16 NOTE — Progress Notes (Signed)
Summary: B/P readings -review B/P readings 06-18-2009  Phone Note Outgoing Call   Call placed by: Katina Dung, RN, BSN,  Jun 15, 2009 9:21 AM Call placed to: Patient Summary of Call: check on B/P  Follow-up for Phone Call        Lisinopril/HCT stopped 06-01-09 Lisinopril 20mg  and Bisoprolol 2.5mg  started  called pt to get B/P readings since medication changes were made --NA Katina Dung, RN, BSN  Jun 15, 2009 1:58 PM   Additional Follow-up for Phone Call Additional follow up Details #1::        talked with pt by telephone--pt states B/P a little lower since making medication changes 06-01-09-she states they are averaging 97-98/60-71--she denies lightheadedness or dizziness--will forward to Dr Shirlee Latch for review

## 2010-03-16 NOTE — Letter (Signed)
Summary: [facility] Clear[facility]   [facility] Clear[facility]   Imported By: Roderic Ovens 06/12/2009 12:01:47  _____________________________________________________________________  External Attachment:    Type:   Image     Comment:   External Document

## 2010-03-16 NOTE — Assessment & Plan Note (Signed)
Summary: 6 month follow up/443.9/pla   Visit Type:  6 mo f/u Primary [name]:  [name]  CC:  Right knee pain.  History of Present Illness: 55 yo WF with h/o PVD, CAD,  DM, HTN, hyperlipidemia and tobacco abuse  who is here today for PV follow up.  She is seen by Dr. Shirlee Latch for her cardiac issues. She was initially seen in January 2011 and described pain and cramping in her right calf with walking. No rest pain or ulcerations. Her right knee has been a chronic issue and hurts with minimal ambulation. She has been told that she should have this replaced. She underwent lower extremity arterial dopplers that suggested at least moderate disease in the right mid SFA. I performed a distal aortogram with bilateral lower extremity runoff on 03/11/09. She was found to have severe disease in the mid right SFA. I brought her back on 03/25/09 for planned TurboHawk atherectomy of the right SFA. She is here today for follow up. ABI post procedure of 0.85 on the right and 0.99 on the left. She had her right knee replaced in June 2011. She tells me that she has had severe pain in her knee since then. She has not been happy with her replacement. Her right calf and ankle hurt occasionally. This seems to be improved overall since the atherectomy of her mid right SFA in February 2011. ABI today stable (see below). No rest pain in her calf muscles. Her toes occasionally tingle in the right foot.      Current Medications (verified): 1)  Metformin Hcl 500 Mg Tabs (Metformin Hcl) .... Take One Tablet Two Times A Day 2)  Naproxen 500 Mg Tabs (Naproxen) .... 2 Tablets Daily 3)  Lisinopril 20 Mg Tabs (Lisinopril) .... One Tablet Daily 4)  Crestor 20 Mg Tabs (Rosuvastatin Calcium) .... One Tablet Daily 5)  Hydrocodone-Acetaminophen 5-500 Mg Tabs (Hydrocodone-Acetaminophen) .... Take One Tablet Every 6 Hours As Needed 6)  Aspirin Ec 325 Mg Tbec (Aspirin) .... Take One Tablet By Mouth Daily 7)  Fish Oil 1000 Mg Caps  (Omega-3 Fatty Acids) .... 2 Caps Once Daily 8)  Advair Diskus 250-50 Mcg/dose Aepb (Fluticasone-Salmeterol) .... Use 1 Puff Twice A Day 9)  Plavix 75 Mg Tabs (Clopidogrel Bisulfate) .... One Tablet Daily 10)  Imdur 30 Mg Xr24h-Tab (Isosorbide Mononitrate) .... One Tablet Daily 11)  Ventolin Hfa 108 (90 Base) Mcg/act Aers (Albuterol Sulfate) .... Two Times A Day Prn 12)  Tramadol Hcl 50 Mg Tabs (Tramadol Hcl) .... One Tablet Twice A Day As Needed For Pain 13)  Bisoprolol Fumarate 5 Mg Tabs (Bisoprolol Fumarate) .... One- Half Tablet Daily  Allergies: 1)  ! * Tetanus 2)  ! * Latex 3)  ! * Milk  Past History:  Past Medical History: Reviewed history from 06/01/2009 and no changes required. 1. CAD: ETT-myoview (12/10) with apical anterior ischemia, EF 75%.  LHC (1/11) showed diffuse disease.  There was a large D3 with serial 70%, 80-90%, and 70% stenoses then severe diffuse distal vessel disease.  Small to moderate OM1 with severe diffuse disease, 50% mRCA, 95% mid small PLV, 60% PDA.  EF 60%.  Patient managed medically, no interventional options.   2. DYSLIPIDEMIA (ICD-272.4) 3. HYPERTENSION (ICD-401.9) 4. DIABETES MELLITUS, TYPE II (ICD-250.00) 5. TOBACCO ABUSE (ICD-305.1): Quit 11/10.  6. Severe osteoarthritis right knee 7. Asthma/COPD 8. PAD: Severe stenosis mid right SFA s/p atherectomy 03/25/09. Moderate disease diffusely throughout the iliacs, bilateral SFA, below knee vessels.  ABIs post-procedure  were 0.85 on right and 0.99 on left.     Social History: Reviewed history from 03/02/2009 and no changes required. No etoh-used to drink. Denies any IV drug use or marijuana use.   Former smoker for 30 years, quit November 2010. She is married with two children. Disabled from knee, not working.    Review of Systems  The patient denies fatigue, malaise, fever, weight gain/loss, vision loss, decreased hearing, hoarseness, chest pain, palpitations, shortness of breath, prolonged cough,  wheezing, sleep apnea, coughing up blood, abdominal pain, blood in stool, nausea, vomiting, diarrhea, heartburn, incontinence, blood in urine, muscle weakness, joint pain, leg swelling, rash, skin lesions, headache, fainting, dizziness, depression, anxiety, enlarged lymph nodes, easy bruising or bleeding, and environmental allergies.         Joint pain-see HPI  Vital Signs:  Patient profile:   55 year old female Height:      62 inches Weight:      172 pounds BMI:     31.57 Pulse rate:   92 / minute Pulse rhythm:   irregular BP sitting:   92 / 60  (left arm) Cuff size:   large  Vitals Entered By: Danielle Rankin, CMA (October 15, 2009 3:52 PM)  Physical Exam  General:  General: Well developed, well nourished, NAD Musculoskeletal: Muscle strength 5/5 all ext Psychiatric: Mood and affect normal Neck: No JVD, no carotid bruits, no thyromegaly, no lymphadenopathy. Lungs:Clear bilaterally, no wheezes, rhonci, crackles CV: RRR no murmurs, gallops rubs Abdomen: soft, NT, ND, BS present Extremities: No edema, pulses 2+ left DP/PT.  1+ right DP/PT.    ABI's  Procedure date:  10/14/2009  Findings:      Right ABI 0.88, Left ABI 1.2. Patent mid right SFA post atherectomy. Bilateral ATA and PTA waveforms biphasic.   Impression & Recommendations:  Problem # 1:  PVD (ICD-443.9) ABI stable. She is difficult in regards to symptoms since her right leg constantly aches after the knee replacement. We will follow her clinically as best we can with non-invasive imaging every 6 months. Repeat ABI in 6 months. Continue ASA and Plavix.   Patient Instructions: 1)  Your physician recommends that you schedule a follow-up appointment in: 6 months 2)  Your physician recommends that you continue on your current medications as directed. Please refer to the Current Medication list given to you today. 3)  Your physician has requested that you have an ankle brachial index (ABI) in 6 months. During this test  an ultrasound and blood pressure cuff are used to evaluate the arteries that supply the arms and legs with blood. Allow thirty minutes for this exam. There are no restrictions or special instructions.

## 2010-03-16 NOTE — Miscellaneous (Signed)
Summary: Orders Update  Clinical Lists Changes  Orders: Added new Test order of Arterial Duplex Lower Extremity (Arterial Duplex Low) - Signed 

## 2010-03-16 NOTE — Assessment & Plan Note (Signed)
Summary: f75m   Visit Type:  Follow-up Primary Provider:  Dr. Mayford Knife, [facility]  CC:  chest pain on and off.  History of Present Illness: 55 yo WF with h/o PVD, CAD,  DM, HTN, and hyperlipidemia is here for followup.  Since last appointment, she had a right TKR in 6/11.  She continues to have pain in the right knee but is walking better.  She has had no exertional chest pain.  She gets occasional achy central chest pain radiating to her back (twice a week on average) that is not related to exertion, lasts about 5 minutes, and resolves spontaneously.  She has been riding a stationary bike for exercise with no dyspnea.  She has been doing well regarding claudication: she is now getting minimal claudication-type pain though she has considerable orthopedic pain.  She is not smoking.   ECG: NSR, normal    Current Medications (verified): 1)  Metformin Hcl 500 Mg Tabs (Metformin Hcl) .... Take One Tablet Two Times A Day 2)  Lisinopril 20 Mg Tabs (Lisinopril) .... One Tablet Daily 3)  Crestor 20 Mg Tabs (Rosuvastatin Calcium) .... One Tablet Daily 4)  Hydrocodone-Acetaminophen 5-500 Mg Tabs (Hydrocodone-Acetaminophen) .... Take One Tablet Every 6 Hours As Needed 5)  Aspirin 81 Mg Tbec (Aspirin) .... Take One Tablet By Mouth Daily 6)  Fish Oil 1000 Mg Caps (Omega-3 Fatty Acids) .... 2 Caps Once Daily 7)  Advair Diskus 250-50 Mcg/dose Aepb (Fluticasone-Salmeterol) .... Use 1 Puff Twice A Day 8)  Plavix 75 Mg Tabs (Clopidogrel Bisulfate) .... One Tablet Daily 9)  Imdur 30 Mg Xr24h-Tab (Isosorbide Mononitrate) .... One Tablet Daily 10)  Ventolin Hfa 108 (90 Base) Mcg/act Aers (Albuterol Sulfate) .... Two Times A Day Prn 11)  Bisoprolol Fumarate 5 Mg Tabs (Bisoprolol Fumarate) .... One- Half Tablet Daily 12)  Multivitamins   Tabs (Multiple Vitamin) .... Once Daily  Allergies (verified): 1)  ! * Tetanus 2)  ! * Latex 3)  ! * Milk  Past History:  Past Medical History: 1. CAD: ETT-myoview  (12/10) with apical anterior ischemia, EF 75%.  LHC (1/11) showed diffuse disease.  There was a large D3 with serial 70%, 80-90%, and 70% stenoses then severe diffuse distal vessel disease.  Small to moderate OM1 with severe diffuse disease, 50% mRCA, 95% mid small PLV, 60% PDA.  EF 60%.  Patient managed medically, no interventional options.   2. DYSLIPIDEMIA (ICD-272.4) 3. HYPERTENSION (ICD-401.9) 4. DIABETES MELLITUS, TYPE II (ICD-250.00) 5. TOBACCO ABUSE (ICD-305.1): Quit 11/10.  6. Severe osteoarthritis right knee, now s/p R TKR.  7. Asthma/COPD 8. PAD: Severe stenosis mid right SFA s/p atherectomy 03/25/09. Moderate disease diffusely throughout the iliacs, bilateral SFA, below knee vessels.  ABIs post-procedure were 0.85 on right and 0.99 on left.  Repeat ABIs (8/11): 0.88 right, 1.2 left.     Family History: Reviewed history from 02/23/2009 and no changes required. Mother alive with CVA Father deceased with emphysema  Social History: Reviewed history from 03/02/2009 and no changes required. No etoh-used to drink. Denies any IV drug use or marijuana use.   Former smoker for 30 years, quit November 2010. She is married with two children. Disabled from knee, not working.    Review of Systems       All systems reviewed and negative except as per HPI.   Vital Signs:  Patient profile:   55 year old female Height:      62 inches Weight:      174 pounds BMI:  31.94 Pulse rate:   79 / minute BP sitting:   112 / 70  (left arm) Cuff size:   regular  Vitals Entered By: Hardin Negus, RMA (December 02, 2009 9:58 AM)  Physical Exam  General:  Well developed, well nourished, in no acute distress. Neck:  Neck supple, no JVD. No masses, thyromegaly or abnormal cervical nodes. Lungs:  Clear bilaterally to auscultation and percussion. Heart:  Non-displaced PMI, chest non-tender; regular rate and rhythm, S1, S2 without murmurs, rubs or gallops. Carotid upstroke normal, no bruit. PT  pulses 2+ bilaterally. No edema, no varicosities. Abdomen:  Bowel sounds positive; abdomen soft and non-tender without masses, organomegaly, or hernias noted. No hepatosplenomegaly. Extremities:  No clubbing or cyanosis. Neurologic:  Alert and oriented x 3. Psych:  Normal affect.   Impression & Recommendations:  Problem # 1:  CAD (ICD-414.00) Patient has diffuse branch vessel disease on catheterization.  I suspect that the apical ischemia on myoview was related to the severely diseased large 3rd diagonal that extends down toward the apex along with the distal LAD.  There was no good target for intervention given diffuse nature of disease.  I have been managing her CAD medically.  Continue ACEI, Crestor, ASA, bisoprolol, and Plavix.  She has had no exertional chest pain, but has episodes of atypical pain.  I will increase her Imdur to 60 mg daily to see if this makes any difference in the chest pain pattern.   Problem # 2:  PVD (ICD-443.9) ABI stable. Follows with Dr. Clifton James. Continue ASA and Plavix.   Problem # 3:  DYSLIPIDEMIA (ICD-272.4) Needs lipids/LFTs, goal LDL < 70.   Other Orders: TLB-Hepatic/Liver Function Pnl (80076-HEPATIC)  Patient Instructions: 1)  Your physician recommends that you schedule a follow-up appointment in: 4 months 2)  Your physician recommends that you return for lab work in:--today--LFT's 3)  Your physician has recommended you make the following change in your medication: increase imdur to 60mg  everyday Prescriptions: IMDUR 60 MG XR24H-TAB (ISOSORBIDE MONONITRATE) take 1 tablet everyday  #30 x 10   Entered by:   Ledon Snare, RN   Authorized by:   Marca Ancona, MD   Signed by:   Ledon Snare, RN on 12/02/2009   Method used:   Faxed to ...       [facility] - [facility] (retail)       [address] [place], [place]  [address]       Ph: [phone]       Fax: [phone]   RxID:   [id number]

## 2010-03-16 NOTE — Letter (Signed)
Summary: Peripheral Vascular  [facility], Main Office  [address]   [place], [place] [address]   Phone: [phone]  Fax: [phone]     03/16/2009 MRN: [id number]  [name] [address] [place], [place]  [address]  Dear Ms. Ankney,   You are scheduled for Peripheral Vascular Angiogram on   March 25, 2009            with Dr.  Clifton James.            Please arrive at the [facility] at    9:30   a.m. on the day of your procedure.  1. DIET     __x__ Nothing to eat or drink after midnight except your medications with a sip of water.  2. Come to the [place] office on     March 23, 2009        for lab work.  The lab at [facility] is open from 8:30 a.m. to 1:30 p.m. and 2:30 p.m. to 5:00 p.m.  The lab at [facility] is open from 7:30 a.m. to 5:30 p.m.  You do not have to be fasting.  3. MAKE SURE YOU TAKE YOUR ASPIRIN.  4. __x___ DO NOT TAKE these medications before your procedure:         Do not take metformin the evening before procedure and the morning of the procedure and for 48 hours after the procedure.       __x__ YOU MAY TAKE ALL of your remaining medications with a small amount of water.      ____ START NEW medications:     ________________________________________________________________________________      ____ Eilene Ghazi instructions:     ________________________________________________________________________________  5. Plan for one night stay - bring personal belongings (i.e. toothpaste, toothbrush, etc.)  6. Bring a current list of your medications and current insurance cards.  7. Must have a responsible person to drive you home.   8. Someone must be with yu for the first 24 hours after you arrive home.  9. Please wear clothes that are easy to get on and off and wear slip-on shoes.  *Special note: Every effort is made to have your procedure done on time.  Occasionally  there are emergencies that present themselves at the hospital that may cause delays.  Please be patient if a delay does occur.  If you have any questions after you get home, please call the office at the number listed above.   Dossie Arbour, RN, BSN  Appended Document: Peripheral Vascular mailed to pt on 03/16/09

## 2010-03-16 NOTE — Letter (Signed)
Summary: Dental Services Medical Clearance  Dental Services Medical Clearance   Imported By: Roderic Ovens 05/21/2009 14:47:36  _____________________________________________________________________  External Attachment:    Type:   Image     Comment:   External Document

## 2010-03-16 NOTE — Progress Notes (Signed)
Summary: post cath instructions  Phone Note Other Incoming   Caller: Dr Shirlee Latch  Summary of Call: post cath instructions Initial call taken by: Katina Dung, RN, BSN,  February 18, 2009 2:39 PM  Follow-up for Phone Call        verbal order from Dr Pierce Crane Imdur 30mg  daily,start Plavix 75mg  two times a day for 1 day then 75mg  daily--follow-up appt in 2 weeks Katina Dung, RN, BSN  February 18, 2009 2:41 PM     New/Updated Medications: PLAVIX 75 MG TABS (CLOPIDOGREL BISULFATE) four  tablets  daily for 1 day the one tablet daily IMDUR 30 MG XR24H-TAB (ISOSORBIDE MONONITRATE) one tablet daily Prescriptions: PLAVIX 75 MG TABS (CLOPIDOGREL BISULFATE) four  tablets  daily for 1 day the one tablet daily  #30 x 3   Entered by:   Katina Dung, RN, BSN   Authorized by:   Marca Ancona, MD   Signed by:   Katina Dung, RN, BSN on 02/18/2009   Method used:   Faxed to ...       [facility] (retail)       [address] [place], [place]  [address]       Ph: [phone]       Fax: [phone]   RxID:   [id number] IMDUR 30 MG XR24H-TAB (ISOSORBIDE MONONITRATE) one tablet daily  #30 x 6   Entered by:   Katina Dung, RN, BSN   Authorized by:   Marca Ancona, MD   Signed by:   Katina Dung, RN, BSN on 02/18/2009   Method used:   Faxed to ...       [facility] (retail)       [address] [place], [place]  [address]       Ph: [phone]       Fax: [phone]   RxID:   [id number]   Appended Document: post cath instructions Plavix instructions given to pt 02-18-09 were to take Plavix 75mg  four tablets for 1 day then one tablet daily per Dr Shirlee Latch

## 2010-03-16 NOTE — Assessment & Plan Note (Signed)
Summary: 3 month rov/sl   Primary Provider:  Tyler Deis Id-Din  CC:  scheduled for right knee replacement by Dr Darrelyn Hillock 07-15-09.  History of Present Illness: 55 yo with history of CAD, PAD, HTN, DM presents for followup.  She had atherectomy and PTCA of mid SFA stenosis by Dr. Clifton James.  Her right calf claudication has decreased significantly since that time.  Her ABIs post-procedure showed R 0.85, L 0.99.  She has had no chest pain.  She is staying off cigarettes.  She is walking some for exercise and is able to walk on flat ground without significant COPD. She has been scheduled for right total knee replacement with Dr. Darrelyn Hillock in 6/11.  SBP is running 100s-110s at home.  No syncope/presyncope.   Labs (3/11): LDL 61, HDL 50, LFTs normal  Current Medications (verified): 1)  Metformin Hcl 500 Mg Tabs (Metformin Hcl) .... Take One Tablet Two Times A Day 2)  Naproxen 500 Mg Tabs (Naproxen) .... 2 Tablets Daily 3)  Lisinopril-Hydrochlorothiazide 20-12.5 Mg Tabs (Lisinopril-Hydrochlorothiazide) .... Take One Tablet  Daily 4)  Crestor 20 Mg Tabs (Rosuvastatin Calcium) .... One Tablet Daily 5)  Hydrocodone-Acetaminophen 5-500 Mg Tabs (Hydrocodone-Acetaminophen) .... Take One Tablet Every 6 Hours As Needed 6)  Aspirin 81 Mg Tabs (Aspirin) .... Once Daily 7)  Fish Oil 1000 Mg Caps (Omega-3 Fatty Acids) .... 2 Caps Once Daily 8)  Advair Diskus 250-50 Mcg/dose Aepb (Fluticasone-Salmeterol) .... Use 1 Puff Twice A Day 9)  Plavix 75 Mg Tabs (Clopidogrel Bisulfate) .... One Tablet Daily 10)  Imdur 30 Mg Xr24h-Tab (Isosorbide Mononitrate) .... One Tablet Daily 11)  Ventolin Hfa 108 (90 Base) Mcg/act Aers (Albuterol Sulfate) .... Two Times A Day Prn 12)  Tramadol Hcl 50 Mg Tabs (Tramadol Hcl) .... One Tablet Twice A Day As Needed For Pain  Allergies: 1)  ! * Tetanus 2)  ! * Latex 3)  ! * Milk  Past History:  Past Medical History: 1. CAD: ETT-myoview (12/10) with apical anterior ischemia, EF 75%.  LHC  (1/11) showed diffuse disease.  There was a large D3 with serial 70%, 80-90%, and 70% stenoses then severe diffuse distal vessel disease.  Small to moderate OM1 with severe diffuse disease, 50% mRCA, 95% mid small PLV, 60% PDA.  EF 60%.  Patient managed medically, no interventional options.   2. DYSLIPIDEMIA (ICD-272.4) 3. HYPERTENSION (ICD-401.9) 4. DIABETES MELLITUS, TYPE II (ICD-250.00) 5. TOBACCO ABUSE (ICD-305.1): Quit 11/10.  6. Severe osteoarthritis right knee 7. Asthma/COPD 8. PAD: Severe stenosis mid right SFA s/p atherectomy 03/25/09. Moderate disease diffusely throughout the iliacs, bilateral SFA, below knee vessels.  ABIs post-procedure were 0.85 on right and 0.99 on left.     Family History: Reviewed history from 02/23/2009 and no changes required. Mother alive with CVA Father deceased with emphysema  Social History: Reviewed history from 03/02/2009 and no changes required. No etoh-used to drink. Denies any IV drug use or marijuana use.   Former smoker for 30 years, quit November 2010. She is married with two children. Disabled from knee, not working.    Vital Signs:  Patient profile:   55 year old female Height:      62 inches Weight:      167.50 pounds BP sitting:   106 / 74  (left arm) Cuff size:   large  Vitals Entered By: Katina Dung, RN, BSN (June 01, 2009 8:08 AM)  Physical Exam  General:  Well developed, well nourished, in no acute distress. Neck:  Neck supple,  no JVD. No masses, thyromegaly or abnormal cervical nodes. Lungs:  Somewhat distant breath sounds, no wheezes.  Heart:  Non-displaced PMI, chest non-tender; regular rate and rhythm, S1, S2 without murmurs, rubs or gallops. Carotid upstroke normal, no bruit. Unable to palpate right PT pulses but foot is warm without ulcers.  Left pedal pulses normal. No edema, no varicosities. Abdomen:  Bowel sounds positive; abdomen soft and non-tender without masses, organomegaly, or hernias noted. No  hepatosplenomegaly. Extremities:  No clubbing or cyanosis. Neurologic:  Alert and oriented x 3. Psych:  Normal affect.   Impression & Recommendations:  Problem # 1:  CAD (ICD-414.00) Patient has diffuse branch vessel disease on catheterization.  I suspect that the apical ischemia on myoview was related to the severely diseased large 3rd diagonal that extends down toward the apex along with the distal LAD.  There was no good target for intervention given diffuse nature of disease.  I will manage her CAD medically.  She is now on Imdur and has not had significant CP since the cath.  Continue ACEI, Crestor, ASA, and Plavix.  I am going to start a beta-1 specific beta blocker (history of asthma/COPD), bisoprolol 2.5 mg daily, today.    Problem # 2:  PVD (ICD-443.9) Symptoms improved post-PTCA.   Problem # 3:  DYSPNEA (ICD-786.05) Exertional shortness of breath.  I suspect that she has a component of COPD from her smoking and that this is the main cause of DOE.  She also carries the diagnosis of asthma from childhood and wheezes frequently.  She is breathing better on Advair.   Problem # 4:  DYSLIPIDEMIA (ICD-272.4) Lipids at goal when last checked.  Continue Crestor.   Problem # 5:  HYPERTENSION (ICD-401.9) SBP running 100s-110s.  I am going to stop HCTZ, she will take lisinopril 20 mg daily without HCTZ, since she is starting bisoprolol (to avoid too much BP decrease).   Problem # 6:  PREOPERATIVE EVALUATION No further cardiac testing is needed prior to knee replacement in June.  It will be ok to come off Plavix for 5 days before the procedure and restart it as soon as feasible after the procedure.  She should continue ASA 81 mg peri-operatively if possible.  She should continue bisoprolol peri-operatively.   Other Orders: EKG w/ Interpretation (93000)  Patient Instructions: 1)  Your physician has recommended you make the following change in your medication:  2)  Stop Lisinopril/HCT 3)   Start Lisinopril 20mg  daily 4)  Start Bisoprolol 2.5mg  daily--this will be one-half of a 5mg  tablet 5)  Your physician has requested that you regularly monitor and record your blood pressure readings at home.  Please use the same machine at the same time of day to check your readings and record them--I will call you in 2 weeks to get the readings. Luana Shu  6)  Your physician wants you to follow-up in: 6 months with Dr Shirlee Latch.   You will receive a reminder letter in the mail two months in advance. If you don't receive a letter, please call our office to schedule the follow-up appointment. Prescriptions: BISOPROLOL FUMARATE 5 MG TABS (BISOPROLOL FUMARATE) one- half tablet daily  #30 x 6   Entered by:   Katina Dung, RN, BSN   Authorized by:   Marca Ancona, MD   Signed by:   Katina Dung, RN, BSN on 06/01/2009   Method used:   Electronically to        [facility] [phone]* (retail)  [address]       [place], [place]  [address]       Ph: [phone]       Fax: [phone]   RxID:   [id number] LISINOPRIL 20 MG TABS (LISINOPRIL) one tablet daily  #30 x 6   Entered by:   Katina Dung, RN, BSN   Authorized by:   Marca Ancona, MD   Signed by:   Katina Dung, RN, BSN on 06/01/2009   Method used:   Electronically to        [facility] [phone]* (retail)       [address]       [place], [place]  [address]       Ph: [phone]       Fax: [phone]   RxID:   [id number]    Vital Signs:  Patient profile:   55 year old female Height:      62 inches Weight:      167.50 pounds BP sitting:   106 / 74  (left arm) Cuff size:   large  Vitals Entered By: Katina Dung, RN, BSN (June 01, 2009 8:08 AM)

## 2010-03-16 NOTE — Progress Notes (Signed)
Summary: refill med  Phone Note Refill Request Call back at Home Phone [phone] Message from:  Patient on Jun 23, 2009 3:02 PM  Refills Requested: Medication #1:  TRAMADOL HCL 50 MG TABS one tablet twice a day as needed for pain [facility] [address] [phone]   Method Requested: Fax to Local Pharmacy Initial call taken by: Lorne Skeens,  Jun 23, 2009 3:02 PM Caller: Patient Reason for Call: Talk to Nurse    Prescriptions: TRAMADOL HCL 50 MG TABS (TRAMADOL HCL) one tablet twice a day as needed for pain  #30 Each x 2   Entered by:   Judithe Modest CMA   Authorized by:   Marca Ancona, MD   Signed by:   Judithe Modest CMA on 06/23/2009   Method used:   [facility]ronically to        [facility]  [address] [phone][facility] (retail)       [address]       [place], [place]  [address]       Ph: [phone]       Fax: [phone]   RxID:   [id number]

## 2010-03-16 NOTE — Letter (Signed)
Summary: Peripheral Vascular  [facility], Main Office  [address]   [place], [place] [address]   Phone: [phone]  Fax: [phone]     02/23/2009 MRN: [id number]  [name] [address] [place], [place]  [address]  Dear [name],   You are scheduled for Peripheral Vascular Angiogram on   March 11, 2009            with Dr.  Clifton James          .  Please arrive at the [facility] at  11     a.m. on the day of your procedure.  1. DIET      May have clear liquid breakfast day of procedure.  Do not drink after 8 AM  2. Come to the [place] office on     March 02, 2009        for lab work.  The lab at [facility] is open from 8:30 a.m. to 1:30 p.m. and 2:30 p.m. to 5:00 p.m.  The lab at [facility] is open from 7:30 a.m. to 5:30 p.m.  You do not have to be fasting.  3. MAKE SURE YOU TAKE YOUR ASPIRIN.  4. ___x__ DO NOT TAKE these medications before your procedure:         ______Do not take metformin evening before procedure and morning of procedure. Do not take for 48 hours after procedure.__________________________________________________________________________      ___x_ YOU MAY TAKE ALL of your remaining medications with a small amount of water.      ____ START NEW medications:     ________________________________________________________________________________      ____ Eilene Ghazi instructions:     ________________________________________________________________________________  5. Plan for one night stay - bring personal belongings (i.e. toothpaste, toothbrush, etc.)  6. Bring a current list of your medications and current insurance cards.  7. Must have a responsible person to drive you home.   8. Someone must be with yu for the first 24 hours after you arrive home.  9. Please wear clothes that are easy to get on and off and wear slip-on shoes.  *Special note: Every effort is made  to have your procedure done on time.  Occasionally there are emergencies that present themselves at the hospital that may cause delays.  Please be patient if a delay does occur.  If you have any questions after you get home, please call the office at the number listed above.   Dossie Arbour, RN, BSN

## 2010-03-16 NOTE — Cardiovascular Report (Signed)
Summary: Pre-Peripheral Vascular Cath and intervention orders  Pre-Peripheral Vascular Cath and intervention orders   Imported By: Kassie Mends 03/23/2009 14:56:01  _____________________________________________________________________  External Attachment:    Type:   Image     Comment:   External Document

## 2010-03-16 NOTE — Cardiovascular Report (Signed)
Summary: Pre-Cath Orders  Pre-Cath Orders   Imported By: Marylou Mccoy 03/04/2009 10:11:57  _____________________________________________________________________  External Attachment:    Type:   Image     Comment:   External Document

## 2010-03-16 NOTE — Progress Notes (Signed)
Summary: questions re med  Phone Note Call from Patient   Caller: Patient Reason for Call: Talk to Nurse Summary of Call: pt has questions re med-pls call [phone] Initial call taken by: Glynda Jaeger,  August 28, 2009 12:10 PM  Follow-up for Phone Call        spoke w/pt she is in the process of reapplying for medicaid and healthserve and is going to have trouble getting meds before her appt at [facility] in Aug, she needs to get Crestor and Bisoprolol changed to something on the [facility] $4 list, will send to Dr Shirlee Latch for review, pt states she has enough of both to last her for another week Meredith Staggers, RN  August 28, 2009 1:00 PM      Appended Document: questions re med Bisoprolol should be on [facility] list.  Would have her come to office to get Crestor samples to use until she gets her [employer] card.   Appended Document: questions re med 08/31/09--1100am--called pt to give instructions about meds, no answer, noanswering machine--nt  Appended Document: questions re med LMTCB   Appended Document: questions re med see note from 08/31/09-samples of Crestor left at desk for pt

## 2010-03-16 NOTE — Assessment & Plan Note (Signed)
Summary: new 443.89/saf   Visit Type:  new pt visit Primary Provider:  Nur Id-Din  CC:  right leg pain.  History of Present Illness: 55 yo WF with CAD,  DM, HTN, hyperlipidemia and tobacco abuse (quit two months ago) who is here today for a PV visit. She is seen by Dr. Shirlee Latch for her cardiac issues. She describes pain and cramping in her right calf with walking. No rest pain or ulcerations. Her right knee has been a chronic issue and hurts with minimal ambulation. She has been told that she should have this replaced. She underwent lower extremity arterial dopplers that suggested at least moderate disease in the right mid SFA. Distal aortic angiogram with pelvic angiography suggested some iliac disease as well on the right. She has no other complaints.   Allergies: 1)  ! * Tetanus 2)  ! * Latex 3)  ! * Milk  Past History:  Past Medical History: Reviewed history from 01/30/2009 and no changes required. 1. CHEST PAIN (ICD-786.50): ETT-myoview (12/10) with apical anterior ischemia, EF 75%.  2. DYSLIPIDEMIA (ICD-272.4) 3. HYPERTENSION (ICD-401.9) 4. DIABETES MELLITUS, TYPE II (ICD-250.00) 5. TOBACCO ABUSE (ICD-305.1): Quit 11/10.  6. Severe osteoarthritis right knee 7. Asthma/COPD 8. PAD: Right leg claudication with arterial dopplers (12/10) showing >50% right SFA stenosis.     Past Surgical History: Reviewed history from 01/02/2009 and no changes required. Tubal ligation   Family History: Mother alive with CVA Father deceased with emphysema  Social History: Reviewed history from 01/30/2009 and no changes required. No etoh-used to drink.  Denies any IV drug use or marijuana use.   Former smoker for 30 years, quit November 2010.  She is married with two children.   Disabled from knee, not working.    Review of Systems       The patient complains of joint pain.  The patient denies fatigue, malaise, fever, weight gain/loss, vision loss, decreased hearing, hoarseness, chest  pain, palpitations, shortness of breath, prolonged cough, wheezing, sleep apnea, coughing up blood, abdominal pain, blood in stool, nausea, vomiting, diarrhea, heartburn, incontinence, blood in urine, muscle weakness, leg swelling, rash, skin lesions, headache, fainting, dizziness, depression, anxiety, enlarged lymph nodes, easy bruising or bleeding, and environmental allergies.         See HPI. Right leg claudication  Vital Signs:  Patient profile:   55 year old female Height:      62.5 inches Weight:      161 pounds BMI:     29.08 Pulse rate:   65 / minute BP sitting:   106 / 72  (left arm) Cuff size:   large  Vitals Entered By: Danielle Rankin, CMA (February 23, 2009 9:14 AM)  Physical Exam  General:  General: Well developed, well nourished, NAD HEENT: OP clear, mucus membranes moist SKIN: warm, dry Neuro: No focal deficits Musculoskeletal: Muscle strength 5/5 all ext Psychiatric: Mood and affect normal Neck: No JVD, no carotid bruits, no thyromegaly, no lymphadenopathy. Lungs:Clear bilaterally, no wheezes, rhonci, crackles CV: RRR no murmurs, gallops rubs Abdomen: soft, NT, ND, BS present Extremities: No edema, pulses 2+ left DP/PT. 1+ right  DP/PT. Sensation intact. No ulcerations.    Arterial Doppler  Procedure date:  01/21/2009  Findings:      >50% right mid SFA stenosis. Mild disease left SFA. Right ABI 0.87. Left ABI 1.0. Right PTA waveform damped and monophasic. Left PTA and DPA waveform brisk.   Impression & Recommendations:  Problem # 1:  PVD (ICD-443.9) Pt with  claudication in right lower extremity and suggestion of mid SFA stenosis on right. Will plan distal aortogram with bilateral lower extremity runoff with possible PTA on 03/11/09. Risks benefits explained. Pt agrees to proceed. Will check CBC, BMET, coags week of procedure.   Other Orders: T-2 View CXR (71020TC)  Patient Instructions: 1)  Your physician recommends that you schedule a follow-up appointment  in: 4 weeks 2)  Have lab work drawn on March 02, 2009 on day of appt with Dr. Shirlee Latch.  CBC, BMP, PT, PTT 443.9 3)  Your physician has requested that you have a peripheral vascular angiogram. This exam is performed at the hospital. During this exam IV contrast is used to look at arterial blood flow.  Please review the information sheet given for details.

## 2010-03-16 NOTE — Progress Notes (Signed)
Summary: refill  Phone Note Refill Request Message from:  Patient on May 18, 2009 9:45 AM  Refills Requested: Medication #1:  CRESTOR 20 MG TABS one tablet daily Send to [address] [phone]  Initial call taken by: Judie Grieve,  May 18, 2009 9:46 AM Caller: Patient  Follow-up for Phone Call       Follow-up by: Judithe Modest CMA,  May 18, 2009 10:49 AM    Prescriptions: CRESTOR 20 MG TABS (ROSUVASTATIN CALCIUM) one tablet daily  #30 x 6   Entered by:   Judithe Modest CMA   Authorized by:   Marca Ancona, MD   Signed by:   Judithe Modest CMA on 05/18/2009   Method used:   Electronically to        [facility] [phone]* (retail)       [address]       [place], [place]  [address]       Ph: [phone]       Fax: [phone]   RxID:   [id number] CRESTOR 20 MG TABS (ROSUVASTATIN CALCIUM) one tablet daily  #30 x 6   Entered by:   Judithe Modest CMA   Authorized by:   Marca Ancona, MD   Signed by:   Judithe Modest CMA on 05/18/2009   Method used:   Faxed to ...       [facility] (retail)       [address] [place], [place]  [address]       Ph: [phone]       Fax: [phone]   RxID:   [id number]

## 2010-03-16 NOTE — Progress Notes (Signed)
Summary: REFILL  Phone Note Refill Request Message from:  Patient on May 15, 2009 8:40 AM  Refills Requested: Medication #1:  CRESTOR 20 MG TABS one tablet daily [facility] [address] [phone], pt request call back [phone]  Initial call taken by: Judie Grieve,  May 15, 2009 8:40 AM Caller: Patient  Follow-up for Phone Call        Rx faxed to pharmacy [address] Follow-up by: Oswald Hillock,  May 15, 2009 3:31 PM    Prescriptions: CRESTOR 20 MG TABS (ROSUVASTATIN CALCIUM) one tablet daily  #30 x 6   Entered by:   Oswald Hillock   Authorized by:   Verne Carrow, MD   Signed by:   Oswald Hillock on 05/15/2009   Method used:   Electronically to        [facility] [phone]* (retail)       [address]       [place], [place]  [address]       Ph: [phone]       Fax: [phone]   RxID:   [id number]

## 2010-03-16 NOTE — Assessment & Plan Note (Signed)
Summary: eph/jml   Visit Type:  EPH Primary Provider:  Tyler Deis Id-Din  CC:  Bilateral leg pain with ambulation.  History of Present Illness: 55 yo WF with h/o PVD, CAD,  DM, HTN, hyperlipidemia and tobacco abuse (quit two months ago) who is here today for follow up after recent PV procedure.  She is seen by Dr. Shirlee Latch for her cardiac issues. She was initially seen in January and described pain and cramping in her right calf with walking. No rest pain or ulcerations. Her right knee has been a chronic issue and hurts with minimal ambulation. She has been told that she should have this replaced. She underwent lower extremity arterial dopplers that suggested at least moderate disease in the right mid SFA. I performed a distal aortogram with bilateral lower extremity runoff on 03/11/09. She was found to have severe disease in the mid right SFA. I brought her back on 03/25/09 for planned TurboHawk atherectomy of the right SFA. She is here today for follow up. ABI post procedure of 0.85 on the right and 0.99 on the left. She tells me that the pain in her right calf is better. She can walk further now without cramping, although she does still get some cramping when walking long distances. Her left leg also cramps when she walks long distances. Her right knee remains painful. She cannot get this replaced at this time without insurance. She is seen in [facility] clinic where she gets her meds.   Current Medications (verified): 1)  Metformin Hcl 500 Mg Tabs (Metformin Hcl) .... Take One Tablet Two Times A Day 2)  Naproxen 500 Mg Tabs (Naproxen) .... 2 Tablets Daily 3)  Lisinopril-Hydrochlorothiazide 20-12.5 Mg Tabs (Lisinopril-Hydrochlorothiazide) .... Take Two Tablets Daily 4)  Crestor 20 Mg Tabs (Rosuvastatin Calcium) .... One Tablet Daily 5)  Hydrocodone-Acetaminophen 5-500 Mg Tabs (Hydrocodone-Acetaminophen) .... Take One Tablet Every 6 Hours As Needed 6)  Aspirin 81 Mg Tabs (Aspirin) .... Once Daily 7)  Fish  Oil 1000 Mg Caps (Omega-3 Fatty Acids) .... 2 Caps Once Daily 8)  Advair Diskus 250-50 Mcg/dose Aepb (Fluticasone-Salmeterol) .... Use 1 Puff Twice A Day 9)  Plavix 75 Mg Tabs (Clopidogrel Bisulfate) .... Four  Tablets  Daily For 1 Day The One Tablet Daily 10)  Imdur 30 Mg Xr24h-Tab (Isosorbide Mononitrate) .... One Tablet Daily 11)  Ventolin Hfa 108 (90 Base) Mcg/act Aers (Albuterol Sulfate) .... Two Times A Day 12)  Tramadol Hcl 50 Mg Tabs (Tramadol Hcl) .... One Tablet Twice A Day As Needed For Pain  Allergies: 1)  ! * Tetanus 2)  ! * Latex 3)  ! * Milk  Past History:  Past Medical History: 1. CAD: ETT-myoview (12/10) with apical anterior ischemia, EF 75%.  LHC (1/11) showed diffuse disease.  There was a large D3 with serial 70%, 80-90%, and 70% stenoses then severe diffuse distal vessel disease.  Small to moderate OM1 with severe diffuse disease, 50% mRCA, 95% mid small PLV, 60% PDA.  EF 60%.  2. DYSLIPIDEMIA (ICD-272.4) 3. HYPERTENSION (ICD-401.9) 4. DIABETES MELLITUS, TYPE II (ICD-250.00) 5. TOBACCO ABUSE (ICD-305.1): Quit 11/10.  6. Severe osteoarthritis right knee 7. Asthma/COPD 8. PAD: Severe stenosis mid right SFA s/p atherectomy 03/25/09. Moderate disease diffusely throughout the iliacs, bilateral SFA, below knee vessels.     Social History: Reviewed history from 03/02/2009 and no changes required. No etoh-used to drink. Denies any IV drug use or marijuana use.   Former smoker for 30 years, quit November 2010. She is married  with two children. Disabled from knee, not working.    Review of Systems  The patient denies fatigue, malaise, fever, weight gain/loss, vision loss, decreased hearing, hoarseness, chest pain, palpitations, shortness of breath, prolonged cough, wheezing, sleep apnea, coughing up blood, abdominal pain, blood in stool, nausea, vomiting, diarrhea, heartburn, incontinence, blood in urine, muscle weakness, joint pain, leg swelling, rash, skin lesions,  headache, fainting, dizziness, depression, anxiety, enlarged lymph nodes, easy bruising or bleeding, and environmental allergies.         Lower extremity pain as described in the HPI.  Vital Signs:  Patient profile:   55 year old female Height:      62.5 inches Weight:      152 pounds Pulse rate:   108 / minute Pulse rhythm:   irregular BP sitting:   102 / 52  (left arm) Cuff size:   large  Vitals Entered By: Danielle Rankin, CMA (April 13, 2009 12:14 PM)  Physical Exam  General:  General: Well developed, well nourished, NAD HEENT: OP clear, mucus membranes moist Psychiatric: Mood and affect normal Neck: No JVD, no carotid bruits, no thyromegaly, no lymphadenopathy. Lungs:Clear bilaterally, no wheezes, rhonci, crackles CV: RRR no murmurs, gallops rubs Abdomen: soft, NT, ND, BS present Extremities: No edema, palpable left and right DP and PT pulses. Both feet are warm. No uclerations. Sensation intact.     Cardiac Cath  Procedure date:  03/25/2009  Findings:      DETAILS OF PROCEDURE:  The patient was brought to the peripheral vascular laboratory after signing informed consent for the procedure. The left groin was prepped and draped in a sterile fashion.  Lidocaine 1% was used for local anesthesia.  A 7-French sheath was inserted into the left femoral artery without difficulty.  We initially used a Glidewire to get around the bifurcation.  We then passed the Terumo Destination sheath around the aortic bifurcation down into the right common femoral artery.  At this point, I used a coronary wire to pass through the right superficial femoral artery and into the right popliteal artery.  We then passed a Spider FilterWire over the first wire.  The filter was deployed at the level of the knee.  We then made 6 runs with the TurboHawk atherectomy device in the more distal portion of the midvessel.  We pulled the device back and made 3 runs in the more proximal portion of the  midsegment.  There was an excellent result in the mid right superficial femoral artery.  Next I performed angiography below the knee and found what appeared to be vasospasm in the tibioperoneal trunk just before the bifurcation into the posterior tibial artery and peroneal artery.  IV nitroglycerin was given and there was some resolution.  It did appear that there was possibly a small dissection in this vessel.  It is possible that the wire did drift distally, although this was not seen on angiography.  There was flow into all 3 vessels at the conclusion of the case.  The patient had good distal pulses.  She was taken to the holding area in stable condition.   IMPRESSION:  Successful percutaneous transluminal angioplasty with atherectomy of the severe stenosis in the mid right superficial femoral artery.  ABI's  Procedure date:  04/10/2009  Findings:      Right 0.85. Left 0.99. Bilateral CFA and popliteal waveforms are brisk and triphasic. Left PTA and ATA waveforms brisk and triphasic. Right PTA and ATA waveforms broadened and biphasic.   Cardiac  Cath  Procedure date:  03/11/2009  Findings:      1. The right renal artery appeared to have mild ostial stenosis of 30-     40%. 2. The left renal artery had a 50% proximal stenosis. 3. The distal aorta had mild diffuse plaque disease of 20-30%.  There     was no aortic aneurysm noted. 4. The right common iliac artery, external iliac artery, femoral     artery had mild plaque disease.  The right proximal superficial     femoral artery had diffuse 30% plaque.  There were several areas in     the mid right SFA that had high-grade stenosis.  There was one area     at 70% followed by another area of 90% stenosis.  Distal right SFA     had 20-30% plaque.  The right popliteal artery had mild plaque     disease.  There was three-vessel runoff to the right foot. 5. The left common iliac artery had a 30% ostial lesion and a long     tubular  40% stenosis throughout the proximal portion of the vessel.     The left external iliac artery and left common femoral artery had     mild plaque.  The left superficial femoral artery had serial 30%     lesions throughout the proximal and mid portion.  The left     popliteal artery had plaque disease.  There was three-vessel runoff     into the left foot.  Impression & Recommendations:  Problem # 1:  PVD (ICD-443.9) S/P atherectomy mid right SFA on 03/25/09. Symptoms are at least mildly improved. I suspect that her right knee is causing her significant problems in addition to her vascular disease. ABI stable. Will repeat dopplers/ABI 6 months. She will continue ASA and Plavix for now. Continue other medications. She is to continue walking as much as possible on a daily basis. She is to alert [place] if there is any change in her clinical status. I have advised her to seek further opinions on her knee when her insurance issues are resolved.   Other Orders: EKG w/ Interpretation (93000)  Patient Instructions: 1)  Your physician recommends that you schedule a follow-up appointment in: 6 months 2)  Your physician has requested that you have a lower or upper extremity arterial duplex.  This test is an ultrasound of the arteries in the legs or arms.  It looks at arterial blood flow in the legs and arms.  Allow one hour for Lower and Upper Arterial scans. There are no restrictions or special instructions. To be done in 6 months

## 2010-03-16 NOTE — Progress Notes (Signed)
Summary: B/P readings  Phone Note Outgoing Call   Call placed by: Katina Dung, RN, BSN,  April 13, 2009 4:19 PM Call placed to: Patient Summary of Call: B/P readings  Follow-up for Phone Call        pt brought recent B/P readings to office today--Dr Shirlee Latch reviewed the readings today and recommended pt decrease Lisinopril/HCT to 20/12.5mg  daily--NA     New/Updated Medications: LISINOPRIL-HYDROCHLOROTHIAZIDE 20-12.5 MG TABS (LISINOPRIL-HYDROCHLOROTHIAZIDE) take one tablet  daily   Current Medications (verified): 1)  Metformin Hcl 500 Mg Tabs (Metformin Hcl) .... Take One Tablet Two Times A Day 2)  Naproxen 500 Mg Tabs (Naproxen) .... 2 Tablets Daily 3)  Lisinopril-Hydrochlorothiazide 20-12.5 Mg Tabs (Lisinopril-Hydrochlorothiazide) .... Take One Tablet  Daily 4)  Crestor 20 Mg Tabs (Rosuvastatin Calcium) .... One Tablet Daily 5)  Hydrocodone-Acetaminophen 5-500 Mg Tabs (Hydrocodone-Acetaminophen) .... Take One Tablet Every 6 Hours As Needed 6)  Aspirin 81 Mg Tabs (Aspirin) .... Once Daily 7)  Fish Oil 1000 Mg Caps (Omega-3 Fatty Acids) .... 2 Caps Once Daily 8)  Advair Diskus 250-50 Mcg/dose Aepb (Fluticasone-Salmeterol) .... Use 1 Puff Twice A Day 9)  Plavix 75 Mg Tabs (Clopidogrel Bisulfate) .... Four  Tablets  Daily For 1 Day The One Tablet Daily 10)  Imdur 30 Mg Xr24h-Tab (Isosorbide Mononitrate) .... One Tablet Daily 11)  Ventolin Hfa 108 (90 Base) Mcg/act Aers (Albuterol Sulfate) .... Two Times A Day 12)  Tramadol Hcl 50 Mg Tabs (Tramadol Hcl) .... One Tablet Twice A Day As Needed For Pain  Allergies: 1)  ! * Tetanus 2)  ! * Latex 3)  ! * Milk discussed with patient by telephone--she will decrease Lisinopril-HCT to 20-12.5mg  daily-she will continue to take and record her B/P

## 2010-03-16 NOTE — Progress Notes (Signed)
Summary: Patient's at Home Vitals  Patient's at Home Vitals   Imported By: Marylou Mccoy 04/22/2009 13:28:05  _____________________________________________________________________  External Attachment:    Type:   Image     Comment:   External Document

## 2010-03-16 NOTE — Progress Notes (Signed)
  Phone Note From Other Clinic   Caller: Jackie/[facility] Details for Reason: Pt.information Initial call taken by: Denny Peon    Faxed LOV over to [phone] [facility]  March 03, 2009 9:38 AM

## 2010-03-19 ENCOUNTER — Other Ambulatory Visit: Payer: Self-pay | Admitting: Family Medicine

## 2010-03-19 DIAGNOSIS — M25512 Pain in left shoulder: Secondary | ICD-10-CM

## 2010-03-21 ENCOUNTER — Ambulatory Visit
Admission: RE | Admit: 2010-03-21 | Discharge: 2010-03-21 | Disposition: A | Discharge status: Home / Self Care | Payer: Self-pay | Source: Ambulatory Visit | Attending: Family Medicine | Admitting: Family Medicine

## 2010-03-21 DIAGNOSIS — M25512 Pain in left shoulder: Secondary | ICD-10-CM

## 2010-03-22 ENCOUNTER — Ambulatory Visit: Payer: Self-pay | Attending: Family Medicine | Admitting: Physical Therapy

## 2010-03-22 DIAGNOSIS — M25619 Stiffness of unspecified shoulder, not elsewhere classified: Secondary | ICD-10-CM | POA: Insufficient documentation

## 2010-03-22 DIAGNOSIS — M25519 Pain in unspecified shoulder: Secondary | ICD-10-CM | POA: Insufficient documentation

## 2010-03-22 DIAGNOSIS — IMO0001 Reserved for inherently not codable concepts without codable children: Secondary | ICD-10-CM | POA: Insufficient documentation

## 2010-03-24 ENCOUNTER — Ambulatory Visit: Payer: Self-pay | Admitting: Physical Therapy

## 2010-03-29 ENCOUNTER — Encounter: Payer: Self-pay | Admitting: Cardiology

## 2010-03-29 ENCOUNTER — Other Ambulatory Visit: Payer: Self-pay | Admitting: Cardiology

## 2010-03-29 ENCOUNTER — Ambulatory Visit (INDEPENDENT_AMBULATORY_CARE_PROVIDER_SITE_OTHER): Payer: Self-pay | Admitting: Cardiology

## 2010-03-29 DIAGNOSIS — E7800 Pure hypercholesterolemia, unspecified: Secondary | ICD-10-CM

## 2010-03-29 DIAGNOSIS — E785 Hyperlipidemia, unspecified: Secondary | ICD-10-CM

## 2010-03-29 DIAGNOSIS — R079 Chest pain, unspecified: Secondary | ICD-10-CM

## 2010-03-29 DIAGNOSIS — I2510 Atherosclerotic heart disease of native coronary artery without angina pectoris: Secondary | ICD-10-CM

## 2010-03-29 LAB — BASIC METABOLIC PANEL
BUN: 18 mg/dL (ref 6–23)
Calcium: 10.3 mg/dL (ref 8.4–10.5)
GFR: 74.36 mL/min (ref >60.00)
Glucose, Bld: 127 mg/dL — ABNORMAL HIGH (ref 70–99)
Sodium: 141 mEq/L (ref 135–145)

## 2010-03-29 LAB — HEPATIC FUNCTION PANEL
ALT: 26 U/L (ref 0–35)
Alkaline Phosphatase: 77 U/L (ref 39–117)
Bilirubin, Direct: 0.1 mg/dL (ref 0.0–0.3)
Total Protein: 7.1 g/dL (ref 6.0–8.3)

## 2010-03-31 ENCOUNTER — Ambulatory Visit: Payer: Self-pay | Admitting: Physical Therapy

## 2010-04-05 ENCOUNTER — Encounter: Payer: Self-pay | Admitting: Physical Therapy

## 2010-04-07 NOTE — Assessment & Plan Note (Signed)
Summary: F4M/PER CHECK OUT/HAPPY BIRTHDAY/SAF/AMD   Primary Provider:  [facility]  CC:  f12m.  Pt states she has been having chest pain and pressure off and on for awhile. Pt would like to check if she can take a diet supplement with all the medications she already takes. The name is Ultimate Fat Burner.  History of Present Illness: 55 yo WF with h/o PVD, CAD,  DM, HTN, and hyperlipidemia is here for followup.  She gets occasional achy central chest pain radiating to her back (twice a week on average) that is not related to exertion, lasts about 5 minutes, and resolves spontaneously.  This is a chronic pattern.  Lately, she has had increased right leg/calf pain.  She feels like her right foot is cold.  The pain is present with most exertion.  She has not been riding her exercise bike much because of the right leg pain.  BP is high today, but when she measures at home her SBP is always < 140.   ECG: NSR, borderline low voltage  Labs (10/11): LFTs normal    Current Medications (verified): 1)  Metformin Hcl 500 Mg Tabs (Metformin Hcl) .... Take One Tablet Two Times A Day 2)  Lisinopril 20 Mg Tabs (Lisinopril) .... One Tablet Daily 3)  Crestor 20 Mg Tabs (Rosuvastatin Calcium) .... One Tablet Daily 4)  Hydrocodone-Acetaminophen 5-500 Mg Tabs (Hydrocodone-Acetaminophen) .... Take One Tablet Every 6 Hours As Needed 5)  Aspirin 81 Mg Tbec (Aspirin) .... Take One Tablet By Mouth Daily 6)  Fish Oil 1000 Mg Caps (Omega-3 Fatty Acids) .... 2 Caps Once Daily 7)  Advair Diskus 250-50 Mcg/dose Aepb (Fluticasone-Salmeterol) .... Use 1 Puff Twice A Day 8)  Plavix 75 Mg Tabs (Clopidogrel Bisulfate) .... One Tablet Daily 9)  Imdur 60 Mg Xr24h-Tab (Isosorbide Mononitrate) .... Take 1 Tablet Everyday 10)  Ventolin Hfa 108 (90 Base) Mcg/act Aers (Albuterol Sulfate) .... Two Times A Day Prn 11)  Bisoprolol Fumarate 5 Mg Tabs (Bisoprolol Fumarate) .... One- Half Tablet Daily 12)  Multivitamins   Tabs (Multiple  Vitamin) .... Once Daily 13)  Robaxin 500 Mg Tabs (Methocarbamol) .... Take One Tablet As Needed  Allergies (verified): 1)  ! * Tetanus 2)  ! * Latex 3)  ! * Milk  Past History:  Past Medical History: Reviewed history from 12/02/2009 and no changes required. 1. CAD: ETT-myoview (12/10) with apical anterior ischemia, EF 75%.  LHC (1/11) showed diffuse disease.  There was a large D3 with serial 70%, 80-90%, and 70% stenoses then severe diffuse distal vessel disease.  Small to moderate OM1 with severe diffuse disease, 50% mRCA, 95% mid small PLV, 60% PDA.  EF 60%.  Patient managed medically, no interventional options.   2. DYSLIPIDEMIA (ICD-272.4) 3. HYPERTENSION (ICD-401.9) 4. DIABETES MELLITUS, TYPE II (ICD-250.00) 5. TOBACCO ABUSE (ICD-305.1): Quit 11/10.  6. Severe osteoarthritis right knee, now s/p R TKR.  7. Asthma/COPD 8. PAD: Severe stenosis mid right SFA s/p atherectomy 03/25/09. Moderate disease diffusely throughout the iliacs, bilateral SFA, below knee vessels.  ABIs post-procedure were 0.85 on right and 0.99 on left.  Repeat ABIs (8/11): 0.88 right, 1.2 left.     Family History: Reviewed history from 02/23/2009 and no changes required. Mother alive with CVA Father deceased with emphysema  Social History: Reviewed history from 03/02/2009 and no changes required. No etoh-used to drink. Denies any IV drug use or marijuana use.   Former smoker for 30 years, quit November 2010. She is married with two children.  Disabled from knee, not working.    Review of Systems       All systems reviewed and negative except as per HPI.   Vital Signs:  Patient profile:   55 year old female Height:      62 inches Weight:      182 pounds BMI:     33.41 Pulse rate:   75 / minute Pulse rhythm:   regular BP sitting:   152 / 90  (left arm) Cuff size:   regular  Vitals Entered By: Judithe Modest CMA (March 29, 2010 10:29 AM)  Physical Exam  General:  Well developed, well  nourished, in no acute distress. Neck:  Neck supple, no JVD. No masses, thyromegaly or abnormal cervical nodes. Lungs:  Clear bilaterally to auscultation and percussion. Heart:  Non-displaced PMI, chest non-tender; regular rate and rhythm, S1, S2 without murmurs, rubs or gallops. Carotid upstroke normal, no bruit. PT and DP pulses 2+ bilaterally. No edema, no varicosities. Abdomen:  Bowel sounds positive; abdomen soft and non-tender without masses, organomegaly, or hernias noted. No hepatosplenomegaly. Extremities:  No clubbing or cyanosis. Neurologic:  Alert and oriented x 3. Psych:  Normal affect.   Impression & Recommendations:  Problem # 1:  CAD (ICD-414.00) Patient had diffuse branch vessel disease on catheterization.  I suspect that the apical ischemia on myoview was related to the severely diseased large 3rd diagonal that extends down toward the apex along with the distal LAD.  There was no good target for intervention given diffuse nature of disease.  I have been managing her CAD medically.  Continue ACEI, Crestor, ASA, bisoprolol, Imdur and Plavix.  She has had no exertional chest pain, but has episodes of atypical pain.  These episodes are stable and chronic and have not been affected by increasing Imdur.    Problem # 2:  PVD (ICD-443.9) Patient reports exertional right calf and foot pain.  Her pedal pulses are strong and equal bilaterally.  Her feet are not cool.  I wonder if her symptoms could be due more to her right knee problems than to PAD.  She is set up for arterial dopplers and PAD followup with Dr. Clifton James in a couple of weeks.   Problem # 3:  HYPERTENSION (ICD-401.9) BP is high today though it has been within normal limits at home.  I will have her increase bisoprolol to 5 mg daily as this can potentially help with anginal pain as well as BP control.    Problem # 4:  DYSLIPIDEMIA (ICD-272.4) Lipids/LFTs today with goal LDL < 70.   Other Orders: TLB-BMP (Basic Metabolic  Panel-BMET) (80048-METABOL) TLB-Lipid Panel (80061-LIPID) TLB-Hepatic/Liver Function Pnl (80076-HEPATIC)  Patient Instructions: 1)  Your physician has recommended you make the following change in your medication:  2)  Increase Bisoprolol to 5mg  daily. 3)  Your physician recommends that you have FASTING lipid profile/liver profile/BMP today--414.01 272.0 4)  Appointment with Dr Clifton James is March 13,2012 at 9:45am. You are scheduled for ultrasoound of the arteries in your legs at 9am that same day. 5)  Your physician wants you to follow-up in:  6 months with Dr Shirlee Latch.[place] No 2012) You will receive a reminder letter in the mail two months in advance. If you don't receive a letter, please call our office to schedule the follow-up appointment. Prescriptions: BISOPROLOL FUMARATE 5 MG TABS (BISOPROLOL FUMARATE) take one tablet once daily  #30 x 6   Entered by:   Judithe Modest CMA   Authorized by:   Freida Busman  Shirlee Latch, MD   Signed by:   Judithe Modest CMA on 03/29/2010   Method used:   Faxed to ...       [facility] (retail)       [address] [place], [place]  [address]       Ph: [phone]       Fax: [phone]   RxID:   [id number]

## 2010-04-12 ENCOUNTER — Ambulatory Visit: Payer: Self-pay | Admitting: Physical Therapy

## 2010-04-14 ENCOUNTER — Ambulatory Visit: Payer: Self-pay | Admitting: Physical Therapy

## 2010-04-23 ENCOUNTER — Ambulatory Visit: Payer: Self-pay | Attending: Family Medicine | Admitting: Physical Therapy

## 2010-04-23 DIAGNOSIS — M25519 Pain in unspecified shoulder: Secondary | ICD-10-CM | POA: Insufficient documentation

## 2010-04-23 DIAGNOSIS — M25619 Stiffness of unspecified shoulder, not elsewhere classified: Secondary | ICD-10-CM | POA: Insufficient documentation

## 2010-04-23 DIAGNOSIS — IMO0001 Reserved for inherently not codable concepts without codable children: Secondary | ICD-10-CM | POA: Insufficient documentation

## 2010-04-26 ENCOUNTER — Encounter: Payer: Self-pay | Admitting: Cardiovascular Disease

## 2010-04-26 ENCOUNTER — Ambulatory Visit: Payer: Self-pay | Admitting: Physical Therapy

## 2010-04-27 ENCOUNTER — Encounter (INDEPENDENT_AMBULATORY_CARE_PROVIDER_SITE_OTHER): Payer: Self-pay

## 2010-04-27 ENCOUNTER — Ambulatory Visit (INDEPENDENT_AMBULATORY_CARE_PROVIDER_SITE_OTHER): Payer: Self-pay | Admitting: Cardiovascular Disease

## 2010-04-27 ENCOUNTER — Encounter: Payer: Self-pay | Admitting: Cardiovascular Disease

## 2010-04-27 DIAGNOSIS — I70219 Atherosclerosis of native arteries of extremities with intermittent claudication, unspecified extremity: Secondary | ICD-10-CM

## 2010-04-27 DIAGNOSIS — I739 Peripheral vascular disease, unspecified: Secondary | ICD-10-CM

## 2010-04-28 ENCOUNTER — Ambulatory Visit: Payer: Self-pay | Admitting: Physical Therapy

## 2010-05-02 LAB — GLUCOSE, CAPILLARY
Glucose-Capillary: 80 mg/dL (ref 70–99)
Glucose-Capillary: 128 mg/dL — ABNORMAL HIGH (ref 70–99)
Glucose-Capillary: 91 mg/dL (ref 70–99)

## 2010-05-02 LAB — BASIC METABOLIC PANEL
GFR calc non Af Amer: >60 mL/min (ref >60)
Glucose, Bld: 122 mg/dL — ABNORMAL HIGH (ref 70–99)
Potassium: 4.9 mEq/L (ref 3.5–5.1)
Sodium: 137 mEq/L (ref 135–145)

## 2010-05-03 ENCOUNTER — Other Ambulatory Visit: Payer: Self-pay | Admitting: Cardiovascular Disease

## 2010-05-03 ENCOUNTER — Encounter: Payer: Self-pay | Admitting: Cardiovascular Disease

## 2010-05-03 ENCOUNTER — Other Ambulatory Visit (INDEPENDENT_AMBULATORY_CARE_PROVIDER_SITE_OTHER): Payer: Self-pay

## 2010-05-03 DIAGNOSIS — I2510 Atherosclerotic heart disease of native coronary artery without angina pectoris: Secondary | ICD-10-CM

## 2010-05-03 DIAGNOSIS — I739 Peripheral vascular disease, unspecified: Secondary | ICD-10-CM

## 2010-05-03 DIAGNOSIS — Z01810 Encounter for preprocedural cardiovascular examination: Secondary | ICD-10-CM

## 2010-05-03 LAB — COMPREHENSIVE METABOLIC PANEL
ALT: 38 U/L — ABNORMAL HIGH (ref 0–35)
AST: 33 U/L (ref 0–37)
Albumin: 3.9 g/dL (ref 3.5–5.2)
Alkaline Phosphatase: 89 U/L (ref 39–117)
Calcium: 9.2 mg/dL (ref 8.4–10.5)
GFR calc Af Amer: >60 mL/min (ref >60)
Glucose, Bld: 154 mg/dL — ABNORMAL HIGH (ref 70–99)
Potassium: 4.4 mEq/L (ref 3.5–5.1)
Sodium: 137 mEq/L (ref 135–145)
Total Protein: 6.6 g/dL (ref 6.0–8.3)

## 2010-05-03 LAB — DIFFERENTIAL
Basophils Relative: 1 % (ref 0–1)
Eosinophils Absolute: 0.4 10*3/uL (ref 0.0–0.7)
Lymphs Abs: 2.3 10*3/uL (ref 0.7–4.0)
Monocytes Absolute: 0.5 10*3/uL (ref 0.1–1.0)
Monocytes Relative: 9 % (ref 3–12)
Neutrophils Relative %: 46 % (ref 43–77)

## 2010-05-03 LAB — CBC WITH DIFFERENTIAL/PLATELET
Basophils Absolute: 0 10*3/uL (ref 0.0–0.1)
Eosinophils Absolute: 0.4 10*3/uL (ref 0.0–0.7)
Eosinophils Relative: 6.2 % — ABNORMAL HIGH (ref 0.0–5.0)
HCT: 35.8 % — ABNORMAL LOW (ref 36.0–46.0)
Lymphs Abs: 1.8 10*3/uL (ref 0.7–4.0)
MCV: 87.2 fl (ref 78.0–100.0)
Monocytes Absolute: 0.5 10*3/uL (ref 0.1–1.0)
Neutrophils Relative %: 57 % (ref 43.0–77.0)
Platelets: 250 10*3/uL (ref 150.0–400.0)
RDW: 13 % (ref 11.5–14.6)
WBC: 6.5 10*3/uL (ref 4.5–10.5)

## 2010-05-03 LAB — PROTIME-INR
INR: 1.51 — ABNORMAL HIGH (ref 0.00–1.49)
INR: 0.9 ratio (ref 0.8–1.0)
INR: 0.93 (ref 0.00–1.49)
Prothrombin Time: 17.3 seconds — ABNORMAL HIGH (ref 11.6–15.2)
Prothrombin Time: 10.2 s (ref 10.2–12.4)

## 2010-05-03 LAB — CBC
MCHC: 33.6 g/dL (ref 30.0–36.0)
Platelets: 345 10*3/uL (ref 150–400)
RDW: 13.7 % (ref 11.5–15.5)

## 2010-05-03 LAB — BASIC METABOLIC PANEL
BUN: 20 mg/dL (ref 6–23)
BUN: 7 mg/dL (ref 6–23)
BUN: 9 mg/dL (ref 6–23)
CO2: 23 mEq/L (ref 19–32)
CO2: 24 mEq/L (ref 19–32)
CO2: 24 mEq/L (ref 19–32)
Calcium: 8.9 mg/dL (ref 8.4–10.5)
Chloride: 104 mEq/L (ref 96–112)
Chloride: 95 mEq/L — ABNORMAL LOW (ref 96–112)
Chloride: 98 mEq/L (ref 96–112)
Creatinine, Ser: 0.7 mg/dL (ref 0.4–1.2)
Creatinine, Ser: 0.55 mg/dL (ref 0.4–1.2)
Creatinine, Ser: 0.61 mg/dL (ref 0.4–1.2)
Creatinine, Ser: 0.79 mg/dL (ref 0.4–1.2)
GFR calc Af Amer: >60 mL/min (ref >60)
Glucose, Bld: 151 mg/dL — ABNORMAL HIGH (ref 70–99)
Glucose, Bld: 184 mg/dL — ABNORMAL HIGH (ref 70–99)
Glucose, Bld: 186 mg/dL — ABNORMAL HIGH (ref 70–99)
Potassium: 4.7 mEq/L (ref 3.5–5.1)
Potassium: 3.3 mEq/L — ABNORMAL LOW (ref 3.5–5.1)
Potassium: 3.9 mEq/L (ref 3.5–5.1)
Sodium: 129 mEq/L — ABNORMAL LOW (ref 135–145)
Sodium: 129 mEq/L — ABNORMAL LOW (ref 135–145)

## 2010-05-03 LAB — GLUCOSE, CAPILLARY
Glucose-Capillary: 131 mg/dL — ABNORMAL HIGH (ref 70–99)
Glucose-Capillary: 166 mg/dL — ABNORMAL HIGH (ref 70–99)
Glucose-Capillary: 178 mg/dL — ABNORMAL HIGH (ref 70–99)
Glucose-Capillary: 184 mg/dL — ABNORMAL HIGH (ref 70–99)
Glucose-Capillary: 186 mg/dL — ABNORMAL HIGH (ref 70–99)
Glucose-Capillary: 195 mg/dL — ABNORMAL HIGH (ref 70–99)
Glucose-Capillary: 219 mg/dL — ABNORMAL HIGH (ref 70–99)

## 2010-05-03 LAB — URINALYSIS, ROUTINE W REFLEX MICROSCOPIC
Hgb urine dipstick: NEGATIVE
Nitrite: NEGATIVE
Specific Gravity, Urine: 1.011 (ref 1.005–1.030)
Urobilinogen, UA: 0.2 mg/dL (ref 0.0–1.0)
pH: 5 (ref 5.0–8.0)

## 2010-05-03 LAB — HEMOGLOBIN AND HEMATOCRIT, BLOOD
HCT: 28.1 % — ABNORMAL LOW (ref 36.0–46.0)
HCT: 28.6 % — ABNORMAL LOW (ref 36.0–46.0)
HCT: 36.8 % (ref 36.0–46.0)
Hemoglobin: 10.6 g/dL — ABNORMAL LOW (ref 12.0–15.0)
Hemoglobin: 12.3 g/dL (ref 12.0–15.0)

## 2010-05-03 LAB — APTT: aPTT: 32 seconds (ref 24–37)

## 2010-05-03 LAB — TYPE AND SCREEN
ABO/RH(D): O POS
Antibody Screen: NEGATIVE

## 2010-05-04 NOTE — Miscellaneous (Signed)
Summary: Orders Update  Clinical Lists Changes  Orders: Added new Test order of Arterial Duplex Lower Extremity (Arterial Duplex Low) - Signed 

## 2010-05-04 NOTE — Assessment & Plan Note (Signed)
Summary: fr68m please do same day as ABI   Visit Type:  Follow-up Primary Provider:  [facility]  CC:  right  leg pain.  History of Present Illness: 55 yo WF with h/o PVD, CAD,  DM, HTN, hyperlipidemia and tobacco abuse  who is here today for PV follow up.  She is seen by Dr. Shirlee Latch for her cardiac issues. She was initially seen in January 2011 and described pain and cramping in her right calf with walking. No rest pain or ulcerations. Her right knee has been a chronic issue and hurts with minimal ambulation. She has been told that she should have this replaced. She underwent lower extremity arterial dopplers that suggested at least moderate disease in the right mid SFA. I performed a distal aortogram with bilateral lower extremity runoff on 03/11/09. She was found to have severe disease in the mid right SFA. I brought her back on 03/25/09 for planned TurboHawk atherectomy of the right SFA. She is here today for follow up. ABI post procedure of 0.85 on the right and 0.99 on the left.  ABI today stable.   She describes increased pain in her right calf with walking. The pain is severe and has worsened since I saw her. She is having difficulty tolerating the pain.    Current Medications (verified): 1)  Metformin Hcl 500 Mg Tabs (Metformin Hcl) .... Take One Tablet Two Times A Day 2)  Lisinopril 20 Mg Tabs (Lisinopril) .... One Tablet Daily 3)  Crestor 40 Mg Tabs (Rosuvastatin Calcium) .... One Daily 4)  Hydrocodone-Acetaminophen 5-500 Mg Tabs (Hydrocodone-Acetaminophen) .... Take One Tablet Every 6 Hours As Needed 5)  Aspirin 81 Mg Tbec (Aspirin) .... Take One Tablet By Mouth Daily 6)  Fish Oil 1000 Mg Caps (Omega-3 Fatty Acids) .... 2 Caps Once Daily 7)  Advair Diskus 250-50 Mcg/dose Aepb (Fluticasone-Salmeterol) .... Use 1 Puff Twice A Day 8)  Plavix 75 Mg Tabs (Clopidogrel Bisulfate) .... One Tablet Daily 9)  Imdur 60 Mg Xr24h-Tab (Isosorbide Mononitrate) .... Take 1 Tablet Everyday 10)   Ventolin Hfa 108 (90 Base) Mcg/act Aers (Albuterol Sulfate) .... Two Times A Day Prn 11)  Bisoprolol Fumarate 5 Mg Tabs (Bisoprolol Fumarate) .... Take One Tablet Once Daily 12)  Multivitamins   Tabs (Multiple Vitamin) .... Once Daily 13)  Robaxin 500 Mg Tabs (Methocarbamol) .... Take One Tablet As Needed  Allergies (verified): 1)  ! * Tetanus 2)  ! * Latex 3)  ! * Milk  Past History:  Past Medical History: Reviewed history from 12/02/2009 and no changes required. 1. CAD: ETT-myoview (12/10) with apical anterior ischemia, EF 75%.  LHC (1/11) showed diffuse disease.  There was a large D3 with serial 70%, 80-90%, and 70% stenoses then severe diffuse distal vessel disease.  Small to moderate OM1 with severe diffuse disease, 50% mRCA, 95% mid small PLV, 60% PDA.  EF 60%.  Patient managed medically, no interventional options.   2. DYSLIPIDEMIA (ICD-272.4) 3. HYPERTENSION (ICD-401.9) 4. DIABETES MELLITUS, TYPE II (ICD-250.00) 5. TOBACCO ABUSE (ICD-305.1): Quit 11/10.  6. Severe osteoarthritis right knee, now s/p R TKR.  7. Asthma/COPD 8. PAD: Severe stenosis mid right SFA s/p atherectomy 03/25/09. Moderate disease diffusely throughout the iliacs, bilateral SFA, below knee vessels.  ABIs post-procedure were 0.85 on right and 0.99 on left.  Repeat ABIs (8/11): 0.88 right, 1.2 left.     Past Surgical History: Reviewed history from 01/02/2009 and no changes required. Tubal ligation   Family History: Reviewed history from 02/23/2009 and  no changes required. Mother alive with CVA Father deceased with emphysema  Social History: Reviewed history from 03/02/2009 and no changes required. No etoh-used to drink. Denies any IV drug use or marijuana use.   Former smoker for 30 years, quit November 2010. She is married with two children. Disabled from knee, not working.    Review of Systems  The patient denies fatigue, malaise, fever, weight gain/loss, vision loss, decreased hearing, hoarseness,  chest pain, palpitations, shortness of breath, prolonged cough, wheezing, sleep apnea, coughing up blood, abdominal pain, blood in stool, nausea, vomiting, diarrhea, heartburn, incontinence, blood in urine, muscle weakness, joint pain, leg swelling, rash, skin lesions, headache, fainting, dizziness, depression, anxiety, enlarged lymph nodes, easy bruising or bleeding, and environmental allergies.         Right leg pain  Vital Signs:  Patient profile:   55 year old female Height:      62 inches Weight:      182 pounds BMI:     33.41 Pulse rate:   78 / minute Resp:     16 per minute BP sitting:   122 / 77  (right arm)  Vitals Entered By: Marrion Coy, CNA (April 27, 2010 9:42 AM)  Physical Exam  General:  General: Well developed, well nourished, NAD HEENT: OP clear, mucus membranes moist SKIN: warm, dry Neuro: No focal deficits Musculoskeletal: Muscle strength 5/5 all ext Psychiatric: Mood and affect normal Neck: No JVD, no carotid bruits, no thyromegaly, no lymphadenopathy. Lungs:Clear bilaterally, no wheezes, rhonci, crackles CV: RRR no murmurs, gallops rubs Abdomen: soft, NT, ND, BS present Extremities: No edema, pulses 1+ right PT, 2+ left DP/PT    ABI's  Procedure date:  04/27/2010  Findings:      Right ABI .89. Left ABI 1.1.   Impression & Recommendations:  Problem # 1:  PVD (ICD-443.9) Plan right  LE angiogram with possible PTA for 05/05/10 at [facility]. Labs today. Risks and benefits  reviewed with pt. She agrees to proceed.   Other Orders: PV Procedure (PV Procedure)  Patient Instructions: 1)  Your physician recommends that you schedule a follow-up appointment in 3-4 weeks.  2)  Your physician recommends that you return for lab work on Monday 05/03/10. 3)  Your physician recommends that you continue on your current medications as directed. Please refer to the Current Medication list given to you today. 4)  Your physician has requested that you have a peripheral  vascular angiogram 05/05/10. This exam is performed at the hospital. During this exam IV contrast is used to look at arterial blood flow.  Please review the information sheet given for details.

## 2010-05-04 NOTE — Letter (Signed)
Summary: Peripheral Vascular  [facility], Main Office  [address]   [place], [place] [address]   Phone: [phone]  Fax: [phone]     04/27/2010 MRN: [id number]  [facility] [place] [address] [facility] [place], [place]  [address]  [place]  Dear Ms. Botero,   You are scheduled for Peripheral Vascular Angiogram on 05/05/10              with Dr. Clifton James.  Please arrive at the [facility] at 8:00       a.m.on the day of your procedure.  1. DIET     __x__ Nothing to eat or drink after midnight except your medications with a sip of water.  2. Come to the [facility] office on 05/03/10  for lab work.  The lab at [facility] is open from 8:30 a.m. to 1:30 p.m. and 2:30 p.m. to 5:00 p.m.  The lab at [facility] is open from 7:30 a.m. to 5:30 p.m.  You do not have to be fasting.  3. MAKE SURE YOU TAKE YOUR ASPIRIN.  4. __x___ DO NOT TAKE these medications before your procedure:         Please hold your Metformin the day before the procedure and for 48 hours after the procedure.      __x__ YOU MAY TAKE ALL of your remaining medications with a small amount of water.    5. Plan for one night stay - bring personal belongings (i.e. toothpaste, toothbrush, etc.)  6. Bring a current list of your medications and current insurance cards.  7. Must have a responsible person to drive you home.   8. Someone must be with yu for the first 24 hours after you arrive home.  9. Please wear clothes that are easy to get on and off and wear slip-on shoes.  *Special note: Every effort is made to have your procedure done on time.  Occasionally there are emergencies that present themselves at the hospital that may cause delays.  Please be patient if a delay does occur.  If you have any questions after you get home, please call the office at the number listed above.   Whitney Maeola Sarah RN

## 2010-05-05 ENCOUNTER — Ambulatory Visit (HOSPITAL_COMMUNITY)
Admission: RE | Admit: 2010-05-05 | Discharge: 2010-05-05 | Disposition: A | Discharge status: Home / Self Care | Payer: Self-pay | Source: Ambulatory Visit | Attending: Cardiovascular Disease | Admitting: Cardiovascular Disease

## 2010-05-05 ENCOUNTER — Ambulatory Visit: Payer: Self-pay | Admitting: Physical Therapy

## 2010-05-05 DIAGNOSIS — I701 Atherosclerosis of renal artery: Secondary | ICD-10-CM | POA: Insufficient documentation

## 2010-05-05 DIAGNOSIS — I739 Peripheral vascular disease, unspecified: Secondary | ICD-10-CM

## 2010-05-05 LAB — CBC
HCT: 36 % (ref 36.0–46.0)
Hemoglobin: 12.2 g/dL (ref 12.0–15.0)
MCHC: 34 g/dL (ref 30.0–36.0)
MCV: 89.3 fL (ref 78.0–100.0)
RDW: 13.1 % (ref 11.5–15.5)

## 2010-05-05 LAB — BASIC METABOLIC PANEL
BUN: 14 mg/dL (ref 6–23)
CO2: 25 mEq/L (ref 19–32)
Calcium: 9.2 mg/dL (ref 8.4–10.5)
Creatinine, Ser: 0.77 mg/dL (ref 0.4–1.2)
GFR calc Af Amer: >60 mL/min (ref >60)
Glucose, Bld: 113 mg/dL — ABNORMAL HIGH (ref 70–99)
Potassium: 4.3 mEq/L (ref 3.5–5.1)
Sodium: 139 mEq/L (ref 135–145)

## 2010-05-05 LAB — GLUCOSE, CAPILLARY
Glucose-Capillary: 187 mg/dL — ABNORMAL HIGH (ref 70–99)
Glucose-Capillary: 108 mg/dL — ABNORMAL HIGH (ref 70–99)
Glucose-Capillary: 122 mg/dL — ABNORMAL HIGH (ref 70–99)
Glucose-Capillary: 149 mg/dL — ABNORMAL HIGH (ref 70–99)

## 2010-05-05 LAB — TSH: TSH: 1.62 u[IU]/mL (ref 0.350–4.500)

## 2010-05-06 NOTE — Procedures (Signed)
[name], ENLOE NO.:  [id number]  MEDICAL RECORD NO.:  [id number]           PATIENT TYPE:  O  LOCATION:  [facility]                         FACILITY:  [facility]  PHYSICIAN:  Verne Carrow, MDDATE OF BIRTH:  [birth year]  DATE OF PROCEDURE:  05/05/2010 DATE OF DISCHARGE:  05/05/2010                   PERIPHERAL VASCULAR INVASIVE PROCEDURE   PRIMARY CARDIOLOGIST:  Marca Ancona, MD  PROCEDURE PERFORMED:  Distal aortogram with bilateral lower extremity runoff.  I selectively engaged the right common femoral artery from the contralateral approach and performed stepwise angiography throughout the entire right leg.  We then performed a runoff of the left lower extremity through the sheath that was present in the left common femoral artery.  OPERATOR:  Verne Carrow, MD  INDICATIONS:  This is a 55 year old Caucasian female with a history of peripheral arterial disease who most recently in February 2011 had an atherectomy of the mid right superficial femoral artery.  Her ABI has been stable; however, she has recently had the onset of severe pain in her right leg with ambulation.  I arranged a diagnostic angiogram today to ultimately define the current state of her peripheral arterial disease.  DETAILS OF PROCEDURE:  The patient was brought to the Main Peripheral Vascular Laboratory after signing informed consent for the procedure. The left groin was prepped and draped in a sterile fashion.  A micropuncture kit was used to obtain access into the left femoral artery.  There were no complications with the serial access.  A wire was passed into the distal aorta.  A pigtail catheter was then passed over this wire.  We performed angiography of the distal aorta with visualization of both renal arteries.  We then pulled the pigtail catheter down to the aortic bifurcation and performed a pelvic angiogram.  At this point, I used a crossover catheter to  selectively engage the right iliac system.  We used an end-hole catheter to perform angiography of the right lower extremity in a stepwise fashion.  We then removed this catheter and performed runoff through the left lower extremity with injection through the sheath present in the left common and femoral artery.  The patient tolerated the procedure well.  There were no immediate complications.  The patient was taken to recovery room in stable condition.  HEMODYNAMIC FINDINGS:  Central aortic pressure 116/71.  ANGIOGRAPHIC FINDINGS: 1. The distal aorta had mild plaque disease, but no evidence of     aneurysms. 2. The bilateral renal arteries were patent.  There was mild 40%     plaque in the proximal portion of the left renal artery.  The right     renal artery had mild plaque disease. 3. The right common iliac artery, external iliac artery and common     femoral artery had no obstructive disease.  The right superficial     femoral artery had mild 40% plaque throughout the proximal segment.     The mid and distal right superficial femoral artery had mild 10%     plaque at the site of previous atherectomy.  The right popliteal     artery was patent.  There was three-vessel runoff to the  right     foot. 4. The left common iliac artery, external iliac artery and common     femoral artery were patent with no severely obstructive disease.     The left superficial femoral artery had mild plaque in the proximal     mid and distal segment.  The left popliteal artery was patent with     mild plaque.  There was three-vessel runoff to the left foot.  IMPRESSION:  Stable peripheral arterial disease.  RECOMMENDATIONS:  I would recommend continuation of medical management at this time.     Verne Carrow, MD     CM/MEDQ  D:  05/05/2010  T:  05/06/2010  Job:  [id number]  cc:   Marca Ancona, MD  Electronically Signed by Verne Carrow MD on 05/06/2010 08:35:39 AM

## 2010-05-07 ENCOUNTER — Encounter: Payer: Self-pay | Admitting: *Deleted

## 2010-05-07 ENCOUNTER — Encounter: Payer: Self-pay | Admitting: Physical Therapy

## 2010-05-10 ENCOUNTER — Encounter: Payer: Self-pay | Admitting: Physical Therapy

## 2010-05-12 ENCOUNTER — Ambulatory Visit: Payer: Self-pay | Admitting: Physical Therapy

## 2010-05-17 ENCOUNTER — Ambulatory Visit: Payer: Self-pay | Admitting: Cardiovascular Disease

## 2010-05-18 ENCOUNTER — Encounter: Payer: Self-pay | Admitting: Physical Therapy

## 2010-05-25 ENCOUNTER — Encounter: Payer: Self-pay | Admitting: Physical Therapy

## 2010-05-26 ENCOUNTER — Encounter: Payer: Self-pay | Admitting: Physical Therapy

## 2010-05-27 ENCOUNTER — Other Ambulatory Visit (INDEPENDENT_AMBULATORY_CARE_PROVIDER_SITE_OTHER): Payer: Self-pay | Admitting: *Deleted

## 2010-05-27 DIAGNOSIS — E785 Hyperlipidemia, unspecified: Secondary | ICD-10-CM

## 2010-05-27 DIAGNOSIS — I2510 Atherosclerotic heart disease of native coronary artery without angina pectoris: Secondary | ICD-10-CM

## 2010-05-27 LAB — HEPATIC FUNCTION PANEL
ALT: 29 U/L (ref 0–35)
AST: 23 U/L (ref 0–37)
Bilirubin, Direct: 0.1 mg/dL (ref 0.0–0.3)
Total Bilirubin: 0.4 mg/dL (ref 0.3–1.2)

## 2010-05-27 LAB — LIPID PANEL
HDL: 54 mg/dL (ref >39.00)
Total CHOL/HDL Ratio: 3

## 2010-06-01 ENCOUNTER — Telehealth: Payer: Self-pay | Admitting: *Deleted

## 2010-06-01 MED ORDER — OMEGA-3-ACID ETHYL ESTERS 1 G PO CAPS: 2.0000 g | ORAL_CAPSULE | Freq: Two times a day (BID) | ORAL | Status: DC

## 2010-06-01 NOTE — Telephone Encounter (Signed)
Notes Recorded by Jacqlyn Krauss, RN on 05/31/2010 at 5:11 PM I talked with pt. She is currently taking two 1000mg  OTC fish oil daily. She agreed to start Lovaza 2 g bid or Tricor 48mg  daily. She gets her medication at the [facility] pharmacy.. I will call the [facility]  tomorrow [phone] which medicaiton is the most feasible. Notes Recorded by Marca Ancona, MD on 05/27/2010 at 9:42 PM Elevated triglycerides. Would start Lovaza 2 g bid or Tricor 48 mg daily

## 2010-06-01 NOTE — Telephone Encounter (Signed)
Message copied by Katina Dung on Tue Jun 01, 2010  8:17 AM ------      Message from: [facility], Freida Busman      Created: Thu May 27, 2010  9:42 PM       Elevated triglycerides.  Would start Lovaza 2 g bid or Tricor 48 mg daily

## 2010-06-04 ENCOUNTER — Ambulatory Visit: Payer: Medicaid Other | Attending: Family Medicine | Admitting: Physical Therapy

## 2010-06-04 DIAGNOSIS — M25519 Pain in unspecified shoulder: Secondary | ICD-10-CM | POA: Insufficient documentation

## 2010-06-04 DIAGNOSIS — IMO0001 Reserved for inherently not codable concepts without codable children: Secondary | ICD-10-CM | POA: Insufficient documentation

## 2010-06-04 DIAGNOSIS — M25619 Stiffness of unspecified shoulder, not elsewhere classified: Secondary | ICD-10-CM | POA: Insufficient documentation

## 2010-06-07 ENCOUNTER — Ambulatory Visit: Payer: Medicaid Other

## 2010-06-09 ENCOUNTER — Ambulatory Visit: Payer: Medicaid Other

## 2010-06-15 ENCOUNTER — Other Ambulatory Visit (HOSPITAL_COMMUNITY): Payer: Self-pay | Admitting: Radiology

## 2010-06-15 ENCOUNTER — Ambulatory Visit: Payer: Self-pay | Admitting: Cardiovascular Disease

## 2010-06-29 NOTE — Procedures (Signed)
NAMEPIER, [name]               ACCOUNT NO.:  [id number]   MEDICAL RECORD NO.:  [id number]          PATIENT TYPE:  AMB   LOCATION:  [facility]                          FACILITY:  [facility]   PHYSICIAN:  Verne Carrow, MDDATE OF BIRTH:  12-22-1955   DATE OF PROCEDURE:  03/11/2009  DATE OF DISCHARGE:                    PERIPHERAL VASCULAR INVASIVE PROCEDURE   PRIMARY CARDIOLOGIST:  Marca Ancona, MD.   PROCEDURES PERFORMED:  Distal aortogram with bilateral lower extremity  runoff.   OPERATOR:  Verne Carrow, MD.   INDICATION:  This is a 55 year old patient with history of diabetes  mellitus, hypertension, hyperlipidemia, former tobacco abuse for the  last 35 years, who has been complaining of right lower extremity  cramping with ambulation.  Noninvasive study suggested a moderate to  severe stenosis in the mid right superficial femoral artery.  Diagnostic  peripheral angiogram was arranged for today.   DETAILS OF THE PROCEDURE:  The patient was brought to the main  peripheral vascular laboratory after signing informed consent for the  procedure.  Both the right and left groins were prepped and draped in a  sterile fashion.  Lidocaine 1% was used for local anesthesia in the left  groin.  Arterial access was obtained in the left femoral artery.  A 5-  French sheath was used for access.  A pig-tailed catheter was inserted  through the sheath over a wire into the distal aorta just above the  bilateral renal arteries.  Our first angiogram was of the distal aorta  with the catheter just above the renal arteries.  We then pulled the pig-  tailed catheter down to the aortic bifurcation and performed a pelvic  angiogram.  At this point, we performed another pelvic angiogram with  bilateral lower extremity runoff.  The patient tolerated the procedure  well and was taken to the holding area in stable condition.   HEMODYNAMIC FINDINGS:  Central aortic pressure 108/62.   ANGIOGRAPHIC FINDINGS:  1. The right renal artery appeared to have mild ostial stenosis of 30-      40%.  2. The left renal artery had a 50% proximal stenosis.  3. The distal aorta had mild diffuse plaque disease of 20-30%.  There      was no aortic aneurysm noted.  4. The right common iliac artery, external iliac artery, femoral      artery had mild plaque disease.  The right proximal superficial      femoral artery had diffuse 30% plaque.  There were several areas in      the mid right SFA that had high-grade stenosis.  There was one area      at 70% followed by another area of 90% stenosis.  Distal right SFA      had 20-30% plaque.  The right popliteal artery had mild plaque      disease.  There was three-vessel runoff to the right foot.  5. The left common iliac artery had a 30% ostial lesion and a long      tubular 40% stenosis throughout the proximal portion of the vessel.      The left external  iliac artery and left common femoral artery had      mild plaque.  The left superficial femoral artery had serial 30%      lesions throughout the proximal and mid portion.  The left      popliteal artery had plaque disease.  There was three-vessel runoff      into the left foot.   IMPRESSION:  1. Severe stenosis of the mid right superficial femoral artery.  2. Diffuse vascular disease throughout the bilateral iliac system as      well as the bilateral superficial femoral arteries.   RECOMMENDATIONS:  We will continue aspirin and Plavix for now.  I will  discuss this with the patient and her husband.  Make plans to bring her  back for atherectomy using the TurboHawk atherectomy device at a later  date.  The patient will be discharged to home tonight after her bedrest.      Verne Carrow, MD     CM/MEDQ  D:  03/11/2009  T:  03/11/2009  Job:  [id number]   cc:   Marca Ancona, MD    Electronically Signed by Verne Carrow MD on 03/19/2009 02:12:45 PM

## 2010-06-29 NOTE — Procedures (Signed)
[name], IVY NO.:  [id number]   MEDICAL RECORD NO.:  [id number]          PATIENT TYPE:  OBV   LOCATION:  2503                         FACILITY:  [facility]   PHYSICIAN:  Verne Carrow, MDDATE OF BIRTH:  1955-11-28   DATE OF PROCEDURE:  03/25/2009  DATE OF DISCHARGE:                    PERIPHERAL VASCULAR INVASIVE PROCEDURE   PRIMARY CARDIOLOGIST:  Marca Ancona, MD.   PROCEDURES PERFORMED:  1. Right lower extremity angiography.  2. Atherectomy of the right superficial femoral artery with a      TurboHawk atherectomy device.   INDICATIONS FOR PROCEDURE:  Right leg pain in a 55 year old female who  underwent diagnostic lower extremity angiography several weeks ago and  was found to have severe stenosis in the mid right superficial femoral  artery.  The patient's ABI was 0.87 in the right lower extremity.  She  did complain of severe claudication in this leg.   DETAILS OF PROCEDURE:  The patient was brought to the peripheral  vascular laboratory after signing informed consent for the procedure.  The left groin was prepped and draped in a sterile fashion.  Lidocaine  1% was used for local anesthesia.  A 7-French sheath was inserted into  the left femoral artery without difficulty.  We initially used a  Glidewire to get around the bifurcation.  We then passed the Terumo  Destination sheath around the aortic bifurcation down into the right  common femoral artery.  At this point, I used a coronary wire to pass  through the right superficial femoral artery and into the right  popliteal artery.  We then passed a Spider FilterWire over the first  wire.  The filter was deployed at the level of the knee.  We then made 6  runs with the TurboHawk atherectomy device in the more distal portion of  the midvessel.  We pulled the device back and made 3 runs in the more  proximal portion of the midsegment.  There was an excellent result in  the mid right  superficial femoral artery.  Next I performed angiography  below the knee and found what appeared to be vasospasm in the  tibioperoneal trunk just before the bifurcation into the posterior  tibial artery and peroneal artery.  IV nitroglycerin was given and there  was some resolution.  It did appear that there was possibly a small  dissection in this vessel.  It is possible that the wire did drift  distally, although this was not seen on angiography.  There was flow  into all 3 vessels at the conclusion of the case.  The patient had good  distal pulses.  She was taken to the holding area in stable condition.   IMPRESSION:  Successful percutaneous transluminal angioplasty with  atherectomy of the severe stenosis in the mid right superficial femoral  artery.   RECOMMENDATIONS:  We will monitor the patient closely overnight.  We  will continue her on aspirin and Plavix.  We will plan on letting her go  home tomorrow if she is stable and repeating ABIs at 2 weeks.      Verne Carrow, MD  CM/MEDQ  D:  03/25/2009  T:  03/25/2009  Job:  [id number]   cc:   Marca Ancona, MD   Electronically Signed by Verne Carrow MD on 04/13/2009 10:50:23 AM

## 2010-07-02 NOTE — H&P (Signed)
[facility]  Patient:    [name], [name]                      MRN: [id number] Adm. Date:  [id number] Attending:  Georgann Housekeeper                         History and Physical  CHIEF COMPLAINT:  Abdominal pain and nausea, vomiting.  HISTORY OF PRESENT ILLNESS:  A 55 year old white female with a history of alcohol abuse, mild asthma, and hypertension.  The patient has been a patient of [facility], does not have a regular physician.  Came in with two days complaint of abdominal pain.  The patient states that it started Tuesday after she ate a sausage and generalized abdominal pain mostly in the upper quadrants and radiated through to the back.  She did have vomiting and nausea today x 1, and she came to the emergency room.  She was nauseous.  When she vomited, it was bilious, no blood.  She was feverish but no high temperatures.  The patient denies any diarrhea or constipation.  Denies any shortness of breath, chest pain, or dysuria.  No cough, no palpitation, no headache.  Denies any melena or hematochezia.  The patient admits to drink regular alcohol for about the last 20-25 years and in the emergency room, she was found to have elevated amylase and lipase consistent with pancreatitis.  In the ER, the patient denies any nausea or abdominal pain at the time of the exam.  She had a morphine shot, which seems to have kept her abdominal pain controlled.  PAST MEDICAL HISTORY:  Four to six months ago, she had a similar episode and was seen in the [facility] Emergency Room, where they discharged her after giving her Demerol, and she remained sick for a week at home.  She had an ultrasound, which report showed no gallstones and prominent pancreas consistent with mild pancreatitis.  Past medical history otherwise: Hypertension, mild asthma, tobacco abuse, and alcohol abuse.  MEDICATIONS:  Tenormin 50 mg q.d., hydrochlorothiazide 12.5 mg q.d., albuterol MDI  p.r.n.  PAST SURGICAL HISTORY:  A tubal ligation 20 years ago.  SOCIAL HISTORY:  She drinks alcohol six beers per day and one to two liquor shots almost every day for the past 29 years.  She had quit in the past for about two to three months without any problems and reports that she and her husband went back to drinking again.  Smokes one pack a day for the last 29 years.  She is married with two children.  Works in the [employer] as a Conservation officer, nature.  She denies any withdrawals or seizures alcohol-related.  Denies any IV drug use or marijuana use.  FAMILY HISTORY:  She has diabetes in her family in an aunt and coronary artery disease also with her aunt.  REVIEW OF SYSTEMS:  As per HPI.  ALLERGIES:  She does not have any known drug allergies.  PHYSICAL EXAMINATION:  VITAL SIGNS:  Her blood pressure was 138/97, pulse of 66 and regular, temperature of 99.1, respirations 18.  GENERAL:  A young female lying in bed, comfortable, in no acute distress, alert and oriented, pleasant.  HEENT:  Pupils equal, reactive.  Extraocular muscles were intact.  The sclerae were nonicteric.  Oropharynx was clear.  NECK:  Supple without any JVD, no adenopathy.  LUNGS:  Clear without any wheezing.  CARDIAC:  Regular rhythm, S1,  S2, without any murmurs, rubs, or gallops.  ABDOMEN:  Abdomen was examined and was soft, positive bowel sounds.  She had some tenderness on palpation of the left upper quadrant-epigastric area.  No rebound or guarding.  Slightly decreased bowel sounds.  EXTREMITIES:  There was no peripheral edema, good pulses.  NEUROLOGIC:  Nonfocal.  LABORATORY DATA:  Significant, lipase was 494, amylase was 357.  Alcohol level less than 5.  Liver enzymes were normal.  UA was negative.  Urine hCG was negative.  Her white count was 12.2, hemoglobin of 18, and platelets of 314. Chemistries:  BUN 11, creatinine 0.6, glucose 118, sodium 137, potassium 4.3, calcium 10.5, and total bilirubin  1.0.  IMPRESSION: 1. Pancreatitis. 2. Hypertension. 3. Ethanol abuse.  PLAN: 1. Admit for pancreatitis, most likely alcohol-induced.  She has a previous    history of pancreatitis by history, never been admitted.  Will make her    n.p.o., IV fluids, pain controlled with morphine, and will get an    ultrasound of the abdomen to evaluate her pancreas for calcification or    fluid. 2. ETOH abuse.  Denies any previous withdrawal symptoms.  We will watch for    withdrawal signs.  Ativan if needed p.r.n. 3. Palpitations, elevated blood pressure secondary to pain, and will continue    her blood pressure medication. 4. Mild history of asthma, no evidence of symptoms currently.  Most likely    related to her smoking.  2. DD:  10/28/99 TD:  10/29/99 Job: [id number]

## 2010-07-02 NOTE — Discharge Summary (Signed)
[facility]  Patient:    [name], [name]                        MRN: [id number] Adm. Date:  05/23/00 Disc. Date: 05/27/00 Attending:  Selina Cooley, M.D. CC:         Dr. Margarette Canada, [facility]   Discharge Summary  DISCHARGE DIAGNOSES: 1. 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 Admission lipase    of 557, discharge lipase of 108. Presenting symptoms of nausea, vomiting,    abdominal pain. CT scan consistent with acute pancreatitis with inflammation around the head of the pancreas, no pseudocyst or ductal    dilation. Prior episodes of pancreatitis in January 2001 and September of    2001 both with abdominal ultrasounds which were negative. 2. Dehydration secondary to problem #1. 3. Alcohol abuse at least four beers a day, no history of withdrawal syndrome. 4. Tobacco use, one pack per day. 5. Chronic NSAID use (5-6 Goody powders q.d. for headache). 6. Hypokalemia secondary GI losses. 7. Hypertension. 8. Dislipidemia with LDL of 186, HDL of 44, triglycerides of 327, total    cholesterol of 295. 9. Leukocytosis of 12.6 secondary to stress from pancreatitis.  DISCHARGE MEDICATIONS: 1. Protonix 40 mg p.o. q.d. 2. Hydrochlorothiazide 25 mg 1/2 tab p.o. q.d. 3. Atenolol 50 mg p.o. q.d. 4. Lotensin 5 mg p.o. q.d.  The patient will follow-up with Dr. Margarette Canada at [facility] within 7-10 days. She was advised to follow a bland low fat diet and strongly encouraged to stop smoking and alcohol intake. AA resources were provided to her by case management. She was also advised to limit her use of NSAIDs specifically Goody powders as this can result in GI problems.  CONDITION ON DISCHARGE:  The patient is alert and oriented in no acute distress with stable vital signs tolerating a p.o. diet with marked improvement in nausea and vomiting and abdominal pain.  REASON FOR ADMISSION:  The patient is a 55 year old female with the above mentioned  medical history specifically history of prior episodes of pancreatitis and alcohol use with negative ultrasounds. He was admitted with a one day history of right upper quadrant pain radiating to the back associated with nausea and vomiting.  HOSPITAL COURSE BY PROBLEM:  #1 - ACUTE RECURRENT PANCREATITIS SECONDARY TO ALCOHOL ABUSE. CT scan was consistent with acute pancreatitis. No evidence of pseudocyst or ductal dilatation. Triglycerides were checked which were elevated at 327 but an unlikely culprit for pancreatitis. The patient was made n.p.o. and placed on IV Demerol and Phenergan with gradual resolution of her symptom. She did seem to require great amounts of Demerol 75 mg q. 3h for pain management initially but subsequently her pain was well controlled without narcotics.  #2 - ALCOHOL ABUSE.  The patient was repeatedly counselled about smoking cessation. She was provided with AA information. She was provided with p.r.n. benzos for withdrawal symptoms; however, did not manifest frank withdrawal. She was given vitamins, thiamine and folate.  #3 - DISLIPIDEMIA. LDL elevated at 186. Will defer to primary care Bartley Vuolo to manage this problem. I am hesitant to initiate ______ in the setting of ongoing alcohol abuse. Strongly encouraged lifestyle changes including low fat diet and regular exercise.  #4 - TOBACCO USE.  Encouraged cessation. The patient ______ this admission.  #5 - CHRONIC NSAID USE.  The patient was advised to limit Goody powder intake and she was placed on a PPI for both prophylaxis and treatment  of dyspepsia.  #6 - HYPERTENSION.  The patient was continued on her usual medications with the exception of hydrochlorothiazide and placed on a clonidine patch with reasonable control. She will be discharged on her medications that she was taking prior to admission. DD:  05/27/00 TD:  05/27/00 Job: [id number]

## 2010-07-02 NOTE — Discharge Summary (Signed)
[facility]  Patient:    [name], [name]                      MRN: [id number] Adm. Date:  [id number] Disc. Date: [id number] Attending:  Georgann Housekeeper                           Discharge Summary  ADMISSION DIAGNOSIS:   Acute pancreatitis.  DISCHARGE DIAGNOSES: 1. Acute pancreatitis. 2. Hypertension. 3. Alcohol abuse. 4. Tobacco abuse.  Please see History & Physical for details.  HOSPITAL COURSE:  #1 - ABDOMINAL PAIN:  The patient is a 55 year old white female who has a long history of alcohol abuse, hypertension, and mild asthma.  She is a patient of [facility] and was admitted with a two-day history of abdominal pain, worsening nausea, and found to have elevated amylase and lipase.  She had a longstanding history of alcohol abuse, and this was thought to be alcohol related.  The patient was admitted onto a regular floor, made n.p.o. with IV fluids.  She was also started on famotidine 20 mg b.i.d.  Initial KUB showed renal calculi, otherwise unremarkable.  The patient underwent an ultrasound of the abdomen which revealed slightly swollen pancreas, no pancreatic pseudocyst, no calcification.  Gallbladder was normal without stones.  Liver was normal.  No free fluid in the abdomen.  The patients abdominal pain continued to improve.  She required morphine for the first two days of hospitalization.  Her amylase and lipase started to come down.  As far as other lab work, her chemistries, CBC, and LFTs remained all normal.  White count initially on admission was 13 which dropped down to 11 and then finally to 7.5.  Her lipase was as high as 900s and then dropped to close to normal ranges in the 100s.  She improved, was started on liquid diet, and advanced as tolerated without any abdominal pain or diarrhea.  She remained afebrile in the hospital.  #2 - HIGH BLOOD PRESSURE:  She was taking Tenormin and hydrochlorothiazide, and this was continued during  her hospitalization.  Her blood pressure remained elevated in the 160s to 170s systolic.  Catapres patch was added, 1.1 mg q.24h. which controlled blood pressure well in the 130s systolic.  PROCEDURES IN HOSPITAL:  Ultrasound of the abdomen which revealed liver parenchyma, no gallstones, mildly inflamed pancreas without any pseudocyst or fluid.  CONSULTATIONS:  None.  DISCHARGE MEDICATIONS: 1. Tenormin 50 mg p.o. q.d. 2. Hydrochlorothiazide 12.5 mg p.o. q.d. 3. Catapres patch 0.1 mg q.24h., change every week. 4. Albuterol MDI 2 puffs q.4-6h. p.r.n.  DIET:  Low-fat diet.  SPECIAL INSTRUCTIONS:  She will stop alcohol completely, and I have instructed her about her recurrence of pancreatitis in future if she continues to drink.  FOLLOWUP:  The patient wishes to follow up with [facility] because of financial reasons.  At this point, she does not have insurance and would like to continue with them.  She has an appointment for next week with [facility].  I have offered her followup at the clinic, but she will think about it.DD:  10/31/99 TD:  11/02/99 Job: [id number]

## 2010-07-29 ENCOUNTER — Telehealth: Payer: Self-pay | Admitting: Cardiology

## 2010-07-29 NOTE — Telephone Encounter (Signed)
Fish oil 1000 mg call in to [facility] [phone].

## 2010-07-30 NOTE — Telephone Encounter (Signed)
This medication refill has been already completed

## 2010-10-19 ENCOUNTER — Telehealth: Payer: Self-pay | Admitting: Cardiology

## 2010-10-19 NOTE — Telephone Encounter (Signed)
Discuss changing of medication. Pt will explain.

## 2010-10-19 NOTE — Telephone Encounter (Signed)
I talked with pt. Pt states [facility] is no longer going to be able to provide Bisoprolol 5mg  daily for pt. Pt states [facility] asked pt to ask Dr Shirlee Latch for an alternative. I will forward to Dr Shirlee Latch for review.

## 2010-10-19 NOTE — Telephone Encounter (Signed)
Have her on this because of COPD.  Other option would be nebivolol but doubt [facility] has this. Bisoprolol should be fairly cheap to get at [facility] or [facility], would look into pricing there before changing.

## 2010-10-20 NOTE — Telephone Encounter (Signed)
Bisoprolol 5/6.25mg  available on [facility] $4 list. I reviewed with Dr Shirlee Latch. It is OK for pt to take Bisoprolol 5/6.25mg  daily instead of Bisoprolol 5mg . LMTCB for pt.

## 2010-10-20 NOTE — Telephone Encounter (Signed)
I talked with pt. Pt is aware that she can change to Bisoprolol 5/6.25mg  available on [facility] $4 lsit. Pt will continue Bisoprolol 5mg  until her appt with Dr Shirlee Latch 11/03/10.

## 2010-11-01 ENCOUNTER — Other Ambulatory Visit (HOSPITAL_COMMUNITY): Payer: Self-pay | Admitting: Family Medicine

## 2010-11-01 ENCOUNTER — Ambulatory Visit (HOSPITAL_COMMUNITY)
Admission: RE | Admit: 2010-11-01 | Discharge: 2010-11-01 | Disposition: A | Discharge status: Home / Self Care | Payer: Self-pay | Source: Ambulatory Visit | Attending: Family Medicine | Admitting: Family Medicine

## 2010-11-01 DIAGNOSIS — R52 Pain, unspecified: Secondary | ICD-10-CM

## 2010-11-01 DIAGNOSIS — M25549 Pain in joints of unspecified hand: Secondary | ICD-10-CM | POA: Insufficient documentation

## 2010-11-03 ENCOUNTER — Telehealth: Payer: Self-pay | Admitting: Cardiology

## 2010-11-03 ENCOUNTER — Ambulatory Visit (INDEPENDENT_AMBULATORY_CARE_PROVIDER_SITE_OTHER): Payer: Self-pay | Admitting: Cardiology

## 2010-11-03 ENCOUNTER — Encounter: Payer: Self-pay | Admitting: Cardiology

## 2010-11-03 DIAGNOSIS — I2510 Atherosclerotic heart disease of native coronary artery without angina pectoris: Secondary | ICD-10-CM

## 2010-11-03 DIAGNOSIS — I739 Peripheral vascular disease, unspecified: Secondary | ICD-10-CM

## 2010-11-03 DIAGNOSIS — E119 Type 2 diabetes mellitus without complications: Secondary | ICD-10-CM

## 2010-11-03 DIAGNOSIS — E785 Hyperlipidemia, unspecified: Secondary | ICD-10-CM

## 2010-11-03 DIAGNOSIS — K219 Gastro-esophageal reflux disease without esophagitis: Secondary | ICD-10-CM

## 2010-11-03 LAB — LDL CHOLESTEROL, DIRECT: Direct LDL: 54.9 mg/dL

## 2010-11-03 LAB — LIPID PANEL
Cholesterol: 136 mg/dL (ref 0–200)
HDL: 47.1 mg/dL (ref >39.00)
Total CHOL/HDL Ratio: 3
VLDL: 67 mg/dL — ABNORMAL HIGH (ref 0.0–40.0)

## 2010-11-03 LAB — HEPATIC FUNCTION PANEL
Albumin: 4.2 g/dL (ref 3.5–5.2)
Alkaline Phosphatase: 98 U/L (ref 39–117)

## 2010-11-03 MED ORDER — BISOPROLOL FUMARATE 5 MG PO TABS: 5.0000 mg | ORAL_TABLET | Freq: Every day | ORAL | Status: DC

## 2010-11-03 MED ORDER — PANTOPRAZOLE SODIUM 40 MG PO TBEC
40.0000 mg | DELAYED_RELEASE_TABLET | Freq: Every day | ORAL | Status: DC
Start: 2010-11-03 — End: 2011-09-14

## 2010-11-03 MED ORDER — BISOPROLOL-HYDROCHLOROTHIAZIDE 5-6.25 MG PO TABS: 1.0000 | ORAL_TABLET | Freq: Every day | ORAL | Status: DC

## 2010-11-03 NOTE — Telephone Encounter (Signed)
Dr Shirlee Latch will prescribe bisoprolol/hctz 5/6.25mg  daily  instead of bisoprolol 5mg  daily.

## 2010-11-03 NOTE — Patient Instructions (Signed)
Start Protonix 40mg  daily.  Your physician recommends that you return for a FASTING lipid profile/liver profile/HGB A1C today 414.01  Your physician wants you to follow-up in: 6 months with Dr Shirlee Latch.(March 2013). You will receive a reminder letter in the mail two months in advance. If you don't receive a letter, please call our office to schedule the follow-up appointment.

## 2010-11-03 NOTE — Telephone Encounter (Signed)
PT CALLING RE MEDICATION THAT WAS CALLED INTO [facility], MED NOT ON $4 LIST

## 2010-11-04 DIAGNOSIS — K219 Gastro-esophageal reflux disease without esophagitis: Secondary | ICD-10-CM | POA: Insufficient documentation

## 2010-11-04 NOTE — Assessment & Plan Note (Signed)
Patient has bothersome GERD symptoms (after meals, tastes acid in her mouth).  I will start her on a PPI.

## 2010-11-04 NOTE — Assessment & Plan Note (Signed)
Stable ABIs, only mildly decreased on the right and normal on the left.  I can feel her pedal pulses bilaterally.  I think that the pain behind her right knee is musculoskeletal.  She has had that knee replaced.  She is quite limited by pain.  I suggested that she followup with the orthopedic surgeon who did her knee surgery.

## 2010-11-04 NOTE — Progress Notes (Signed)
PCP: Georganna Skeans  55 yo WF with h/o PVD, CAD,  DM, HTN, and hyperlipidemia is here for followup.  She gets occasional achy central chest pain radiating to her back (twice a week on average) that is not related to exertion, lasts about 5 minutes, and resolves spontaneously.  This is a chronic pattern. She has also been having increased heartburn (after meals, acid taste in her mouth).  She does not take a PPI.  She continues to have pain in her right leg posterior to her right knee.  This has been limiting her ambulation considerably.  She saw Dr. Clifton James not long ago and ABIs looked good, so I think that the pain is musculoskeletal (status post right TKR).  She does not get exertional dyspnea but is not very active because of her leg pain.   ECG: NSR, anterior T wave flattening  Labs (10/11): LFTs normal Labs (4/12): LDL 73, HDL 54, triglycerides 340  Allergies (verified):  1)  ! * Tetanus 2)  ! * Latex 3)  ! * Milk  Past History:  Past Medical History: Reviewed history from 12/02/2009 and no changes required. 1. CAD: ETT-myoview (12/10) with apical anterior ischemia, EF 75%.  LHC (1/11) showed diffuse disease.  There was a large D3 with serial 70%, 80-90%, and 70% stenoses then severe diffuse distal vessel disease.  Small to moderate OM1 with severe diffuse disease, 50% mRCA, 95% mid small PLV, 60% PDA.  EF 60%.  Patient managed medically, no interventional options.   2. DYSLIPIDEMIA (ICD-272.4) 3. HYPERTENSION (ICD-401.9) 4. DIABETES MELLITUS, TYPE II (ICD-250.00) 5. TOBACCO ABUSE (ICD-305.1): Quit 11/10.  6. Severe osteoarthritis right knee, now s/p R TKR.  7. Asthma/COPD 8. PAD: Severe stenosis mid right SFA s/p atherectomy 03/25/09. Moderate disease diffusely throughout the iliacs, bilateral SFA, below knee vessels.  ABIs post-procedure were 0.85 on right and 0.99 on left.  Repeat ABIs (8/11): 0.88 right, 1.2 left.  ABIs (3/12): 0.85 right, 0.99 left    Family History: Mother  alive with CVA Father deceased with emphysema  Social History: No etoh-used to drink. Denies any IV drug use or marijuana use.   Former smoker for 30 years, quit November 2010. She is married with two children. Disabled from knee, not working.    Review of Systems        All systems reviewed and negative except as per HPI.   Current Outpatient Prescriptions  Medication Sig Dispense Refill  . albuterol (PROVENTIL HFA;VENTOLIN HFA) 108 (90 BASE) MCG/ACT inhaler Inhale 2 puffs into the lungs 2 (two) times daily as needed.        Marland Kitchen aspirin 81 MG tablet Take 81 mg by mouth daily.        . Fluticasone-Salmeterol (ADVAIR DISKUS) 250-50 MCG/DOSE AEPB Inhale 1 puff into the lungs every 12 (twelve) hours.        Marland Kitchen HYDROcodone-acetaminophen (VICODIN) 5-500 MG per tablet Take 1 tablet by mouth every 6 (six) hours as needed.        . isosorbide mononitrate (IMDUR) 60 MG 24 hr tablet Take 60 mg by mouth daily.        Marland Kitchen lisinopril (PRINIVIL,ZESTRIL) 20 MG tablet Take 20 mg by mouth daily.        . metFORMIN (GLUCOPHAGE) 500 MG tablet Take 500 mg by mouth 2 (two) times daily with a meal.        . Multiple Vitamin (MULTIVITAMIN) tablet Take 1 tablet by mouth daily.        Marland Kitchen  omega-3 acid ethyl esters (LOVAZA) 1 G capsule Take 2 capsules (2 g total) by mouth 2 (two) times daily.  120 capsule  11  . prasugrel (EFFIENT) 10 MG TABS Take 10 mg by mouth.        . rosuvastatin (CRESTOR) 40 MG tablet Take 40 mg by mouth daily.        . bisoprolol-hydrochlorothiazide (ZIAC) 5-6.25 MG per tablet Take 1 tablet by mouth daily.  30 tablet  6  . methocarbamol (ROBAXIN) 500 MG tablet Take 500 mg by mouth daily as needed.        . pantoprazole (PROTONIX) 40 MG tablet Take 1 tablet (40 mg total) by mouth daily.  30 tablet  11    BP 132/80  Pulse 84  Ht 5\' 3"  (1.6 m)  Wt 182 lb (82.555 kg)  BMI 32.24 kg/m2 General: NAD Neck: No JVD, no thyromegaly or thyroid nodule.  Lungs: Clear to auscultation bilaterally with  normal respiratory effort. CV: Nondisplaced PMI.  Heart regular S1/S2, no S3/S4, no murmur.  No peripheral edema.  No carotid bruit.  Normal pedal pulses.  Abdomen: Soft, nontender, no hepatosplenomegaly, no distention.  Neurologic: Alert and oriented x 3.  Psych: Normal affect. Extremities: No clubbing or cyanosis.

## 2010-11-04 NOTE — Assessment & Plan Note (Signed)
Check lipids/LFTs with goal LDL < 70.  

## 2010-11-04 NOTE — Assessment & Plan Note (Signed)
No exertional chest pain.  Continue ASA 81, bisoprolol, Imdur, lisinopril, and statin.  She was switched from Plavix to Effient by [facility].  No history of bleeding so will continue this for now.

## 2010-11-22 ENCOUNTER — Other Ambulatory Visit (HOSPITAL_COMMUNITY): Payer: Self-pay | Admitting: Family Medicine

## 2010-11-22 DIAGNOSIS — Z1231 Encounter for screening mammogram for malignant neoplasm of breast: Secondary | ICD-10-CM

## 2010-11-25 ENCOUNTER — Ambulatory Visit (HOSPITAL_COMMUNITY)
Admission: RE | Admit: 2010-11-25 | Discharge: 2010-11-25 | Disposition: A | Discharge status: Home / Self Care | Payer: Self-pay | Source: Ambulatory Visit | Attending: Family Medicine | Admitting: Family Medicine

## 2010-11-25 DIAGNOSIS — Z1231 Encounter for screening mammogram for malignant neoplasm of breast: Secondary | ICD-10-CM | POA: Insufficient documentation

## 2011-04-29 ENCOUNTER — Other Ambulatory Visit: Payer: Self-pay | Admitting: Cardiology

## 2011-04-29 DIAGNOSIS — I739 Peripheral vascular disease, unspecified: Secondary | ICD-10-CM

## 2011-04-29 DIAGNOSIS — K219 Gastro-esophageal reflux disease without esophagitis: Secondary | ICD-10-CM

## 2011-04-29 DIAGNOSIS — E785 Hyperlipidemia, unspecified: Secondary | ICD-10-CM

## 2011-04-29 DIAGNOSIS — I2510 Atherosclerotic heart disease of native coronary artery without angina pectoris: Secondary | ICD-10-CM

## 2011-04-29 MED ORDER — PRASUGREL HCL 10 MG PO TABS: 10.0000 mg | ORAL_TABLET | Freq: Every day | ORAL | Status: DC

## 2011-04-29 NOTE — Telephone Encounter (Signed)
ROUTING MESSAGE TO NURSE AND TRIAGE

## 2011-04-29 NOTE — Telephone Encounter (Signed)
New refill Pt wants effient called to [facility] [phone]

## 2011-04-29 NOTE — Telephone Encounter (Signed)
Called into [facility].

## 2011-05-12 ENCOUNTER — Other Ambulatory Visit: Payer: Self-pay | Admitting: Cardiology

## 2011-05-12 DIAGNOSIS — I739 Peripheral vascular disease, unspecified: Secondary | ICD-10-CM

## 2011-05-24 ENCOUNTER — Encounter (INDEPENDENT_AMBULATORY_CARE_PROVIDER_SITE_OTHER): Payer: Self-pay

## 2011-05-24 DIAGNOSIS — I739 Peripheral vascular disease, unspecified: Secondary | ICD-10-CM

## 2011-05-24 DIAGNOSIS — I70219 Atherosclerosis of native arteries of extremities with intermittent claudication, unspecified extremity: Secondary | ICD-10-CM

## 2011-05-27 ENCOUNTER — Telehealth: Payer: Self-pay | Admitting: *Deleted

## 2011-05-27 NOTE — Telephone Encounter (Signed)
Pt called and notified ABI's normal--nt

## 2011-05-30 ENCOUNTER — Other Ambulatory Visit: Payer: Self-pay | Admitting: Cardiology

## 2011-05-31 NOTE — Telephone Encounter (Signed)
..   Requested Prescriptions   Pending Prescriptions Disp Refills  . bisoprolol-hydrochlorothiazide (ZIAC) 2.5-6.25 MG per tablet [Pharmacy Med Name: BISOPRL/HCTZ 2.5/6.25MG  TAB] 30 tablet 6    Sig: TAKE ONE TABLET BY MOUTH EVERY DAY

## 2011-05-31 NOTE — Telephone Encounter (Signed)
F/U  Patient tried to get RX this morning, but [facility] told her they did not have any prescriptions for her.  Patient request return call at [phone] to advise.

## 2011-07-26 ENCOUNTER — Ambulatory Visit (INDEPENDENT_AMBULATORY_CARE_PROVIDER_SITE_OTHER): Payer: Self-pay | Admitting: Cardiology

## 2011-07-26 ENCOUNTER — Encounter: Payer: Self-pay | Admitting: Cardiology

## 2011-07-26 VITALS — BP 118/74 | HR 83 | Ht 62.0 in | Wt 175.0 lb

## 2011-07-26 DIAGNOSIS — I2510 Atherosclerotic heart disease of native coronary artery without angina pectoris: Secondary | ICD-10-CM

## 2011-07-26 DIAGNOSIS — I7389 Other specified peripheral vascular diseases: Secondary | ICD-10-CM

## 2011-07-26 DIAGNOSIS — R079 Chest pain, unspecified: Secondary | ICD-10-CM

## 2011-07-26 DIAGNOSIS — E785 Hyperlipidemia, unspecified: Secondary | ICD-10-CM

## 2011-07-26 LAB — LIPID PANEL
Cholesterol: 131 mg/dL (ref 0–200)
Total CHOL/HDL Ratio: 3
VLDL: 54.8 mg/dL — ABNORMAL HIGH (ref 0.0–40.0)

## 2011-07-26 LAB — BASIC METABOLIC PANEL
Chloride: 103 mEq/L (ref 96–112)
Creatinine, Ser: 1.2 mg/dL (ref 0.4–1.2)
Potassium: 4.9 mEq/L (ref 3.5–5.1)
Sodium: 138 mEq/L (ref 135–145)

## 2011-07-26 LAB — LDL CHOLESTEROL, DIRECT: Direct LDL: 55.2 mg/dL

## 2011-07-26 MED ORDER — ISOSORBIDE MONONITRATE ER 60 MG PO TB24: ORAL_TABLET | ORAL | Status: DC

## 2011-07-26 NOTE — Assessment & Plan Note (Signed)
Stable ABIs, only mildly decreased on the right and normal on the left.  I can feel her pedal pulses bilaterally, weaker on right.  I think that her knees are the main source of pain in her legs.  Continue medical treatment, ABIs in 1 year.

## 2011-07-26 NOTE — Assessment & Plan Note (Signed)
Check lipids/LFTs, goal LDL < 70.  

## 2011-07-26 NOTE — Assessment & Plan Note (Signed)
Increased pattern of atypical chest pain.  She had diffuse branch vessel disease on 2011 cath not amenable to PCI.  As her symptoms have worsened recently, will plan Lexiscan myoview.  Continue ASA 81, bisoprolol, lisinopril, and statin.  I will increase Imdur to 90 mg daily.  She was switched from Plavix to Effient by [facility].  She was on Plavix for chronic CAD and PAD.  We called [facility] today; they now carry Plavix.  I am going to switch her back to Plavix (off Effient).

## 2011-07-26 NOTE — Patient Instructions (Signed)
Start Plavix (clopidogrel) 75mg  daily when you finish your current supply of Effient. You will take Plavix (clopidogrel) instead of Effient.  Increase Imdur(isosorbide) to 90mg  daily. This will be one and one-half 60mg  tablets daily.  Your physician recommends that you have a FASTING lipid profile /BMET today.   Your physician has requested that you have a lexiscan myoview. For further information please visit [email or web address] Please follow instruction sheet, as given.  Your physician recommends that you schedule a follow-up appointment in: 4 months with Dr Shirlee Latch.

## 2011-07-26 NOTE — Progress Notes (Signed)
PCP: Georganna Skeans  56 yo WF with h/o PVD, CAD,  DM, HTN, and hyperlipidemia is here for followup.  She has had a chronic pattern of chest pain radiating to her back.  However, over the last few weeks, this seems to have worsened.  She is having the chest pain about 2-3 times a week, lasting up to 10-15 minutes at a time.  There is no clear trigger; it is not exertional.  She denies significant exertional dyspnea but she is not very active because of her knee pain.  She is able to walk about 1/2 mile daily for exercise.  She has bilateral calf pain as well, this is not clearly exertional either.  ABIs were done in 4/13 and were stable compared to the prior examinations.  Weight is down 7 lbs since prior appointment.   ECG: NSR, anterior T wave flattening  Labs (10/11): LFTs normal Labs (4/12): LDL 73, HDL 54, triglycerides 340 Labs (9/12): TGs 335, HDL 47, LDL 55  Allergies (verified):  1)  ! * Tetanus 2)  ! * Latex 3)  ! * Milk  Past History:  Past Medical History: 1. CAD: ETT-myoview (12/10) with apical anterior ischemia, EF 75%.  LHC (1/11) showed diffuse disease.  There was a large D3 with serial 70%, 80-90%, and 70% stenoses then severe diffuse distal vessel disease.  Small to moderate OM1 with severe diffuse disease, 50% mRCA, 95% mid small PLV, 60% PDA.  EF 60%.  Patient managed medically, no interventional options.   2. DYSLIPIDEMIA (ICD-272.4) 3. HYPERTENSION (ICD-401.9) 4. DIABETES MELLITUS, TYPE II (ICD-250.00) 5. TOBACCO ABUSE (ICD-305.1): Quit 11/10.  6. Severe osteoarthritis right knee, now s/p R TKR.  7. Asthma/COPD 8. PAD: Severe stenosis mid right SFA s/p atherectomy 03/25/09. Moderate disease diffusely throughout the iliacs, bilateral SFA, below knee vessels.  ABIs post-procedure were 0.85 on right and 0.99 on left.  Repeat ABIs (8/11): 0.88 right, 1.2 left.  ABIs (3/12): 0.85 right, 0.99 left.  ABIs (4/13): 0.81 right, 0.98 left.    Family History: Mother alive with  CVA Father deceased with emphysema  Social History: No etoh-used to drink. Denies any IV drug use or marijuana use.   Former smoker for 30 years, quit November 2010. She is married with two children. Disabled from knee, not working.    Review of Systems        All systems reviewed and negative except as per HPI.   Current Outpatient Prescriptions  Medication Sig Dispense Refill  . albuterol (PROVENTIL HFA;VENTOLIN HFA) 108 (90 BASE) MCG/ACT inhaler Inhale 2 puffs into the lungs 2 (two) times daily as needed.        Marland Kitchen aspirin 81 MG tablet Take 81 mg by mouth daily.        . bisoprolol-hydrochlorothiazide (ZIAC) 2.5-6.25 MG per tablet TAKE ONE TABLET BY MOUTH EVERY DAY  30 tablet  6  . Fluticasone-Salmeterol (ADVAIR DISKUS) 250-50 MCG/DOSE AEPB Inhale 1 puff into the lungs every 12 (twelve) hours.        Marland Kitchen HYDROcodone-acetaminophen (VICODIN) 5-500 MG per tablet Take 1 tablet by mouth every 6 (six) hours as needed.        Marland Kitchen lisinopril (PRINIVIL,ZESTRIL) 20 MG tablet Take 20 mg by mouth daily.        . metFORMIN (GLUCOPHAGE) 1000 MG tablet Take 1,000 mg by mouth 2 (two) times daily with a meal.      . Multiple Vitamin (MULTIVITAMIN) tablet Take 1 tablet by mouth daily.        Marland Kitchen  omega-3 acid ethyl esters (LOVAZA) 1 G capsule Take 2 capsules (2 g total) by mouth 2 (two) times daily.  120 capsule  11  . pantoprazole (PROTONIX) 40 MG tablet Take 1 tablet (40 mg total) by mouth daily.  30 tablet  11  . rosuvastatin (CRESTOR) 40 MG tablet Take 40 mg by mouth daily.        Marland Kitchen DISCONTD: isosorbide mononitrate (IMDUR) 60 MG 24 hr tablet Take 60 mg by mouth daily.      . clopidogrel (PLAVIX) 75 MG tablet Take 1 tablet (75 mg total) by mouth daily.      . isosorbide mononitrate (IMDUR) 60 MG 24 hr tablet Take 1 and 1/2 tablets daily  45 tablet  6  . DISCONTD: isosorbide mononitrate (IMDUR) 60 MG 24 hr tablet Take 60 mg by mouth daily.          BP 118/74  Pulse 83  Ht 5\' 2"  (1.575 m)  Wt 79.379 kg  (175 lb)  BMI 32.01 kg/m2  SpO2 98% General: NAD Neck: No JVD, no thyromegaly or thyroid nodule.  Lungs: Clear to auscultation bilaterally with normal respiratory effort. CV: Nondisplaced PMI.  Heart regular S1/S2, no S3/S4, no murmur.  No peripheral edema.  No carotid bruit.  2+ PT on left, trace PT on right.  Abdomen: Soft, nontender, no hepatosplenomegaly, no distention.  Neurologic: Alert and oriented x 3.  Psych: Normal affect. Extremities: No clubbing or cyanosis.

## 2011-08-03 ENCOUNTER — Ambulatory Visit (HOSPITAL_COMMUNITY): Payer: Self-pay | Attending: Cardiology | Admitting: Radiology

## 2011-08-03 VITALS — BP 125/58 | HR 90 | Ht 62.0 in | Wt 171.0 lb

## 2011-08-03 DIAGNOSIS — I2510 Atherosclerotic heart disease of native coronary artery without angina pectoris: Secondary | ICD-10-CM

## 2011-08-03 DIAGNOSIS — R079 Chest pain, unspecified: Secondary | ICD-10-CM

## 2011-08-03 DIAGNOSIS — R0602 Shortness of breath: Secondary | ICD-10-CM

## 2011-08-03 DIAGNOSIS — I7389 Other specified peripheral vascular diseases: Secondary | ICD-10-CM

## 2011-08-03 DIAGNOSIS — E785 Hyperlipidemia, unspecified: Secondary | ICD-10-CM | POA: Insufficient documentation

## 2011-08-03 MED ORDER — TECHNETIUM TC 99M TETROFOSMIN IV KIT
11.0000 | PACK | Freq: Once | INTRAVENOUS | Status: AC | PRN
  Administered 2011-08-03: 11 via INTRAVENOUS

## 2011-08-03 MED ORDER — REGADENOSON 0.4 MG/5ML IV SOLN
0.4000 mg | Freq: Once | INTRAVENOUS | Status: AC
  Administered 2011-08-03: 0.4 mg via INTRAVENOUS

## 2011-08-03 MED ORDER — TECHNETIUM TC 99M TETROFOSMIN IV KIT
33.0000 | PACK | Freq: Once | INTRAVENOUS | Status: AC | PRN
  Administered 2011-08-03: 33 via INTRAVENOUS

## 2011-08-03 NOTE — Progress Notes (Addendum)
[facility] SITE 3 NUCLEAR MED [address] [place] [address] [phone]  Cardiology Nuclear Med Study  [name] is a 56 y.o. female     MRN : [id number]     DOB: 1955-04-26  Procedure Date: 08/03/2011  Nuclear Med Background Indication for Stress Test:  Evaluation for Ischemia History:  '10 HQI:ONGEXB anterior ischemia, EF=75%; '11 Moderate CAD, medical tx, EF=60% Cardiac Risk Factors: Claudication, History of Smoking, Hypertension, Lipids, NIDDM and PVD  Symptoms:  Chest Pain/Tightness (last episode of chest discomfort was Monday, 08/01/11), Dizziness, DOE, Nausea, Near Syncope, Rapid HR and Vomiting   Nuclear Pre-Procedure Caffeine/Decaff Intake:  None NPO After: 8:00pm   Lungs:  Clear. O2 Sat: 99% on room air. IV 0.9% NS with Angio Cath:  20g  IV Site: R Antecubital  IV Started by:  Stanton Kidney, EMT-P  Chest Size (in):  42 Cup Size: C  Height: 5\' 2"  (1.575 m)  Weight:  171 lb (77.565 kg)  BMI:  Body mass index is 31.28 kg/(m^2). Tech Comments:  CBG= 130 @ 8am, per patient.    Nuclear Med Study 1 or 2 day study: 1 day  Stress Test Type:  Eugenie Birks  Reading MD: Cassell Clement, MD  Order Authorizing Provider:  Marca Ancona, MD  Resting Radionuclide: Technetium 71m Tetrofosmin  Resting Radionuclide Dose: 11.0 mCi   Stress Radionuclide:  Technetium 39m Tetrofosmin  Stress Radionuclide Dose: 33.0 mCi           Stress Protocol Rest HR: 90 Stress HR: 120  Rest BP: 125/58 Stress BP: 123/67  Exercise Time (min): n/a METS: n/a   Predicted Max HR: 166 bpm % Max HR: 72.29 bpm Rate Pressure Product: 28413   Dose of Adenosine (mg):  n/a Dose of Lexiscan: 0.4 mg  Dose of Atropine (mg): n/a Dose of Dobutamine: n/a mcg/kg/min (at max HR)  Stress Test Technologist: Smiley Houseman, CMA-N  Nuclear Technologist:  Domenic Polite, CNMT     Rest Procedure:  Myocardial perfusion imaging was performed at rest 45 minutes following the intravenous  administration of Technetium 53m Tetrofosmin.  Rest ECG: No acute changes  Stress Procedure:  The patient received IV Lexiscan 0.4 mg over 15-seconds.  Technetium 30m Tetrofosmin injected at 30-seconds.  There were nonspecific ST-T wave changes with Lexiscan.  She c/o chest tightness, 4/10.  Quantitative spect images were obtained after a 45 minute delay.  Stress ECG: No significant ST segment change suggestive of ischemia.  QPS Raw Data Images:  Normal; no motion artifact; normal heart/lung ratio. Stress Images:  Normal homogeneous uptake in all areas of the myocardium. Rest Images:  Normal homogeneous uptake in all areas of the myocardium. Subtraction (SDS):  No evidence of ischemia. Transient Ischemic Dilatation (Normal <1.22):  1.23 Lung/Heart Ratio (Normal <0.45):  0.33  Quantitative Gated Spect Images QGS EDV:  44 ml QGS ESV:  12 ml  Impression Exercise Capacity:  Lexiscan with no exercise. BP Response:  Normal blood pressure response. Clinical Symptoms:  Mild chest pain/dyspnea. ECG Impression:  No significant ST segment change suggestive of ischemia. Comparison with Prior Nuclear Study: No images to compare  Overall Impression:  Normal stress nuclear study.  LV Ejection Fraction: 74%.  LV Wall Motion:  NL LV Function; NL Wall Motion  [name]  Normal study, please inform patient.   Marca Ancona 08/04/2011

## 2011-08-04 NOTE — Progress Notes (Signed)
Pt.notified

## 2011-09-14 ENCOUNTER — Other Ambulatory Visit: Payer: Self-pay | Admitting: *Deleted

## 2011-09-14 ENCOUNTER — Telehealth: Payer: Self-pay | Admitting: Cardiology

## 2011-09-14 NOTE — Telephone Encounter (Signed)
Pt asking about refilling medications since [facility] is closing. Dr Shirlee Latch has recommended pt contact [facility] [phone] or [facility] [phone] for primary care. He has said pt could discontinue protonix. She can take fish oil 2000mg  twice a day instead of lovaza, and I will try to enroll pt in Crestor pt assistance program. Pt is going to call around to get the best price for clopidogrel.

## 2011-09-14 NOTE — Telephone Encounter (Signed)
New Problem:    Patient called in wanting to know if she would be able to transfer all of her medications that were being refilled by [facility] to [place].  Please call back.

## 2011-09-20 NOTE — Telephone Encounter (Signed)
Mailed application to pt for Crestor pt assistance program through [place]. [phone]. Spoke with pt and she is aware that I have mailed the application to her.

## 2011-10-25 ENCOUNTER — Encounter: Payer: Self-pay | Admitting: Family Medicine

## 2011-10-25 ENCOUNTER — Ambulatory Visit (INDEPENDENT_AMBULATORY_CARE_PROVIDER_SITE_OTHER): Payer: Self-pay | Admitting: Family Medicine

## 2011-10-25 VITALS — BP 119/69 | HR 108 | Ht 63.0 in | Wt 172.0 lb

## 2011-10-25 DIAGNOSIS — I2510 Atherosclerotic heart disease of native coronary artery without angina pectoris: Secondary | ICD-10-CM

## 2011-10-25 DIAGNOSIS — R112 Nausea with vomiting, unspecified: Secondary | ICD-10-CM

## 2011-10-25 DIAGNOSIS — M1710 Unilateral primary osteoarthritis, unspecified knee: Secondary | ICD-10-CM

## 2011-10-25 DIAGNOSIS — E119 Type 2 diabetes mellitus without complications: Secondary | ICD-10-CM

## 2011-10-25 LAB — POCT GLYCOSYLATED HEMOGLOBIN (HGB A1C): Hemoglobin A1C: 6.8

## 2011-10-25 MED ORDER — OMEPRAZOLE MAGNESIUM 20 MG PO TBEC: 20.0000 mg | DELAYED_RELEASE_TABLET | Freq: Two times a day (BID) | ORAL | Status: DC

## 2011-10-25 NOTE — Progress Notes (Signed)
Patient ID: [name], female   DOB: 16-May-1955, 56 y.o.   MRN: [id number] Patient ID: [name], female   DOB: 03-22-1955, 56 y.o.   MRN: [id number]  Chief Complaint  Patient presents with  . NP-refill meds  . Diabetes  . Hypertension    HPI [name] is a 56 y.o. Female who presents to establish care. She was an established patient at [facility] until they closed. Her complaints are the following: - nausea and vomiting in the morning: vomiting 1-4 times in the morning with occasional associated stomach pain on left side. Has been occuring for 2-3 months. Has tried to eat crackers to help with the nausea. She is then able to eat normally throughout the rest of the day, without nausea or vomiting. Pain is not associated with eating, is intermittent. No diarrhea, no constipation. No dysuria or polyuria.  - DM2: checks CBG once a day and runs between 120 and 130 fasting. Was previously on metformin and glucotrol but had hypoglycemic episodes and rash with glucotrol so this was stopped. She denies any current hypoglycemic episodes. No blurred vision. No sores or ulcers. No polyuria.  - knee pain, bilateral: s/p right knee surgery in 2011. Continues to have pain. Pain is independent from walking. Pain occurs at rest. No recent increased weakness. Takes vicodin twice daily which helps the pain. Has tried aspecreme which caused a rash and she cannot take ibuprofen because of exacerbation of GERD.   Past Medical History  Diagnosis Date  . CAD (coronary artery disease)     ETT-myoview (12/10) with apical anterior ischemia, EF 75%.  LHC (1/11) showed diffuse disease..   There was a large D3 with serial 70%, 80-90%, and 70% stenoses then severe diffuse distal vessel disease.   Small to moderate OM1 with severe diffuse disease, 50% mRCA, 95% mid small PLV, 60% PDA.  EF 60%.  Patient managed medically, no interventional options.   . Other and unspecified hyperlipidemia 401.9    250.00  . Tobacco  use disorder     quit 11/10  . Osteoarthritis     severe right knee  R TKR  . Asthma with COPD   . PAD (peripheral artery disease)     Severe stenosis mid right SFA s/p atherectomy 03/25/09. Moderate disease diffusely throughout the  iliacs, bilateral SFA, below knee vessels.  ABIs post-procedure were 0.85 on right and 0.99 on left.   . Pancreatitis 2003    Past Surgical History  Procedure Date  . Joint replacement 2011    right knee replacement   . Tubal ligation 1980    Family History  Problem Relation Age of Onset  . Emphysema Father     Social History History  Substance Use Topics  . Smoking status: Former Smoker: quit 3 years ago. Used to smoke 1pack per day from the age of 56yo.   . Smokeless tobacco: Not on file  . Alcohol Use: No    Allergies  Allergen Reactions  . Latex   . Milk-Related Compounds   . Tetanus Toxoid   . Glucotrol (Glipizide) Rash    Current Outpatient Prescriptions  Medication Sig Dispense Refill  . albuterol (PROVENTIL HFA;VENTOLIN HFA) 108 (90 BASE) MCG/ACT inhaler Inhale 2 puffs into the lungs 2 (two) times daily as needed.        Marland Kitchen aspirin 81 MG tablet Take 81 mg by mouth daily.        . bisoprolol-hydrochlorothiazide (ZIAC) 2.5-6.25 MG per tablet TAKE ONE TABLET BY  MOUTH EVERY DAY  30 tablet  6  . clopidogrel (PLAVIX) 75 MG tablet Take 1 tablet (75 mg total) by mouth daily.      . fish oil-omega-3 fatty acids 1000 MG capsule Take 2 capsules (2 g total) by mouth 2 (two) times daily.      . Fluticasone-Salmeterol (ADVAIR DISKUS) 250-50 MCG/DOSE AEPB Inhale 1 puff into the lungs every 12 (twelve) hours.        Marland Kitchen HYDROcodone-acetaminophen (VICODIN) 5-500 MG per tablet Take 1 tablet by mouth every 6 (six) hours as needed.        . isosorbide mononitrate (IMDUR) 60 MG 24 hr tablet Take 1 and 1/2 tablets daily  45 tablet  6  . lisinopril (PRINIVIL,ZESTRIL) 20 MG tablet Take 20 mg by mouth daily.        . metFORMIN (GLUCOPHAGE) 1000 MG tablet Take  1,000 mg by mouth 2 (two) times daily with a meal.      . Multiple Vitamin (MULTIVITAMIN) tablet Take 1 tablet by mouth daily.        Marland Kitchen omeprazole (PRILOSEC OTC) 20 MG tablet Take 1 tablet (20 mg total) by mouth 2 (two) times daily.  60 tablet  1  . rosuvastatin (CRESTOR) 40 MG tablet Take 40 mg by mouth daily.        Marland Kitchen DISCONTD: omega-3 acid ethyl esters (LOVAZA) 1 G capsule Take 2 capsules (2 g total) by mouth 2 (two) times daily.  120 capsule  11    Review of Systems Review of Systems  All other systems reviewed and are negative.    Blood pressure 119/69, pulse 108, height 5\' 3"  (1.6 m), weight 172 lb (78.019 kg).  Physical Exam Physical Exam  Constitutional: She appears well-developed and well-nourished. No distress.  HENT:  Head: Normocephalic.  Nose: Nose normal.  Mouth/Throat: Oropharynx is clear and moist.       Cobblestone pattern in oropharynx  Eyes: EOM are normal. Pupils are equal, round, and reactive to light.  Neck: No thyromegaly present.  Cardiovascular: Normal rate, regular rhythm and normal heart sounds.   Pulmonary/Chest: Effort normal and breath sounds normal. No respiratory distress. She has no wheezes.  Abdominal: Soft. She exhibits no distension. There is no rebound and no guarding.       Tender along epigastrium and along upper left quadrant  Musculoskeletal: She exhibits no edema.       Tenderness to palpation along medial aspect of left and right knees bilaterally. Crepitus present bilaterally. 4/5 strength in lower extremities bilaterally.     Assessment    See problem list    Plan    See problem list       [name] 10/25/2011, 10:12 PM

## 2011-10-25 NOTE — Patient Instructions (Addendum)
Your A1C was 6.8, so we will continue with the metformin as you are taking for now.   For the nausea and vomiting in the morning, it could be from acid reflux as well as from allergic postnasal drip. Continue taking an allergy pill once at night. Also you can take omeprazole (15$ at [facility] for 30 pills0twice daily for 2 weeks to see if it helps.   I'd like to see you back in 2-3 weeks to follow up on the knee pain.

## 2011-10-25 NOTE — Assessment & Plan Note (Addendum)
Bilaterally. Told patient I could not prescribe narcotic on first visit. Will reconsider at next visit and will consider obtaining xrays.  Patient reports not being able to tolerate topical agents such as aspecreme. Cannot take NSAIDs given gastritis

## 2011-10-25 NOTE — Assessment & Plan Note (Addendum)
A1C: 6.8. Continue current management for now with contnued diet changes that patient has been making: low fat, no sugar, no sweet drinks. She tries to exercise although this is difficult given her knee pain. She does continue to try and walk.

## 2011-10-25 NOTE — Assessment & Plan Note (Signed)
Pregnancy as source of morning nausea and vomiting ruled out given menopause for 10 years and tubal ligation. This could be secondary to postnasal drip. If the case, recommended that patient take daily antihistamine. SHe had been taking one pill every other day or so. Given epigastric tenderness, this could also be gastritis. She used to take protonix but this is too expensive for her. Changed it to prilosec 20mg  bid for the next couple of weeks. Will monitor symptoms. Given history of diabetes, this could also be due to gastroparesis. Will wait for follow up before prescribing reglan. If continues to have symptoms, will also consider obtaining H-pylori test.

## 2011-10-26 NOTE — Assessment & Plan Note (Signed)
Followed by Dr. Shirlee Latch. Had myoview stress test on 08/03/11 which was normal. Continue current management and following up with cardiology

## 2011-11-11 ENCOUNTER — Other Ambulatory Visit: Payer: Self-pay | Admitting: Family Medicine

## 2011-11-11 DIAGNOSIS — K219 Gastro-esophageal reflux disease without esophagitis: Secondary | ICD-10-CM

## 2011-11-11 MED ORDER — FLUTICASONE-SALMETEROL 250-50 MCG/DOSE IN AEPB: 1.0000 | INHALATION_SPRAY | Freq: Two times a day (BID) | RESPIRATORY_TRACT | Status: DC

## 2011-11-11 MED ORDER — ALBUTEROL SULFATE HFA 108 (90 BASE) MCG/ACT IN AERS: 2.0000 | INHALATION_SPRAY | Freq: Two times a day (BID) | RESPIRATORY_TRACT | Status: DC | PRN

## 2011-11-11 MED ORDER — ESOMEPRAZOLE MAGNESIUM 20 MG PO PACK: 20.0000 mg | PACK | Freq: Every day | ORAL | Status: DC

## 2011-11-11 NOTE — Telephone Encounter (Signed)
Filling out form for [employer]. Sending Rx for Advair 250/40mcg and Proventil HFA. Will also change omeprazole to nexium since the latter is covered for free with [employer]. Rx for nexium sent.

## 2011-11-14 ENCOUNTER — Other Ambulatory Visit: Payer: Self-pay | Admitting: *Deleted

## 2011-11-14 ENCOUNTER — Telehealth: Payer: Self-pay | Admitting: *Deleted

## 2011-11-14 DIAGNOSIS — I2510 Atherosclerotic heart disease of native coronary artery without angina pectoris: Secondary | ICD-10-CM

## 2011-11-14 DIAGNOSIS — I7389 Other specified peripheral vascular diseases: Secondary | ICD-10-CM

## 2011-11-14 DIAGNOSIS — E785 Hyperlipidemia, unspecified: Secondary | ICD-10-CM

## 2011-11-14 DIAGNOSIS — R079 Chest pain, unspecified: Secondary | ICD-10-CM

## 2011-11-14 MED ORDER — OMEGA-3 FATTY ACIDS 1000 MG PO CAPS: 2.0000 g | ORAL_CAPSULE | Freq: Two times a day (BID) | ORAL | Status: DC

## 2011-11-14 MED ORDER — LISINOPRIL 10 MG PO TABS: 10.0000 mg | ORAL_TABLET | Freq: Every day | ORAL | Status: DC

## 2011-11-14 MED ORDER — ISOSORBIDE MONONITRATE ER 60 MG PO TB24: ORAL_TABLET | ORAL | Status: DC

## 2011-11-14 NOTE — Telephone Encounter (Signed)
Dr Shirlee Latch received information dated 11/10/11  from [employer] [phone],  [phone] fax requesting new prescriptions for Lovaza and Imdur. I have faxed 90 day prescriptions for both of these medications to the [employer] program. Faxed information also states pt's BP was 98/70 R and 110/78 L . Per Dr Mckinley Jewel lisinopril to 10mg  daily. Pt has been getting lisinopril from [facility] for $4 for 30 days. Lisinopril 10mg  is on $4 list at [facility]. Pt states she can continue to get lisinopril from [facility].

## 2011-11-18 ENCOUNTER — Ambulatory Visit (INDEPENDENT_AMBULATORY_CARE_PROVIDER_SITE_OTHER): Payer: Self-pay | Admitting: Family Medicine

## 2011-11-18 ENCOUNTER — Encounter: Payer: Self-pay | Admitting: Family Medicine

## 2011-11-18 VITALS — BP 132/67 | HR 107 | Ht 63.0 in | Wt 170.0 lb

## 2011-11-18 DIAGNOSIS — I739 Peripheral vascular disease, unspecified: Secondary | ICD-10-CM

## 2011-11-18 DIAGNOSIS — R109 Unspecified abdominal pain: Secondary | ICD-10-CM

## 2011-11-18 DIAGNOSIS — M79604 Pain in right leg: Secondary | ICD-10-CM

## 2011-11-18 DIAGNOSIS — M79609 Pain in unspecified limb: Secondary | ICD-10-CM

## 2011-11-18 DIAGNOSIS — M1710 Unilateral primary osteoarthritis, unspecified knee: Secondary | ICD-10-CM

## 2011-11-18 DIAGNOSIS — I2510 Atherosclerotic heart disease of native coronary artery without angina pectoris: Secondary | ICD-10-CM

## 2011-11-18 DIAGNOSIS — M79605 Pain in left leg: Secondary | ICD-10-CM

## 2011-11-18 DIAGNOSIS — R112 Nausea with vomiting, unspecified: Secondary | ICD-10-CM

## 2011-11-18 MED ORDER — HYDROCODONE-ACETAMINOPHEN 5-500 MG PO TABS: 1.0000 | ORAL_TABLET | Freq: Three times a day (TID) | ORAL | Status: DC | PRN

## 2011-11-18 NOTE — Patient Instructions (Addendum)
We are going to get an ultrasound of your legs to exclude clots. Please make an appointment with the pharmacy clinic, Dr. Raymondo Band for ABI's to check the blood pressure in your legs.  For the abdominal pain, I am ordering an abdominal ultrasound and getting lab work to check liver enzymes and pancreas function.

## 2011-11-19 LAB — COMPLETE METABOLIC PANEL WITH GFR
ALT: 20 U/L (ref 0–35)
AST: 17 U/L (ref 0–37)
Alkaline Phosphatase: 65 U/L (ref 39–117)
Creat: 1.24 mg/dL — ABNORMAL HIGH (ref 0.50–1.10)
GFR, Est African American: 57 mL/min — ABNORMAL LOW
Sodium: 139 mEq/L (ref 135–145)
Total Bilirubin: 0.2 mg/dL — ABNORMAL LOW (ref 0.3–1.2)
Total Protein: 6.7 g/dL (ref 6.0–8.3)

## 2011-11-19 NOTE — Progress Notes (Signed)
Patient ID: [name], female   DOB: 08/08/1955, 56 y.o.   MRN: [id number] --- Subjective:  [name] is a 56 y.o.female who presents for follow up on leg pain and abdominal pain. - abdominal pain: associated with nausea and vomiting. Has been occuring for ~3 months. She was started on omperazole at her last visit as well as a daily antihistamine. Since then, the frequency of episodes of emesis has decreased to every 2 to 3 days as opposed to every day. It used to occur in the monring only, but now also occurs at night. He also reports some mid abdominal pain that occurs 58min-1hr after eating. She notices that spicy foods make it worst. No diarrhea or constipation. No dysuria or polyuria.  - leg pain: has occurred for about 1 year in both legs. Has recently gotten more painful. Pain is located at back of legs, worst with walking a short distance, better with rest, worst at night. She has not used anything for it. She has chronic knee pain and also notices sharp shooting pain from her knee.  No recent hospitalization or immobilization. She does report a history of leg clot a few years ago.  - jaw and shoulder pain: reports that 1 month ago, she experienced jaw tingling that radiated to left shoulder. No chest pain per say. Lasted 10 minutes. 2 weeks ago, she had recurrence of this. Took a nitro SL which helped. No associated shortness of breath. Some palpitations.   ROS: see HPI Past Medical History: reviewed and updated medications and allergies. Social History: Tobacco: denies current  Objective: Filed Vitals:   11/18/11 1331  BP: 132/67  Pulse: 107    Physical Examination:   General appearance - alert, well appearing, and in no distress Neck - supple, no significant adenopathy Chest - clear to auscultation, no wheezes, rales or rhonchi, symmetric air entry Heart - normal rate, regular rhythm, normal S1, S2, no murmurs, rubs, clicks or gallops Abdomen - soft, discomfort with palpation of  left lower and upper quadrants, no rebound, nondistended, no masses or organomegaly Extremities - peripheral pulses normal, no pedal edema, no clubbing or cyanosis Legs - symmetric: 32.5cm around calf bilaterally, no tenderness to palpation of leg bilaterally. Tenderness to palpation along medial aspect of right knee. Decreased range of motion in left knee extension. Left knee midline scar present. Dorsalis pedis pulses present bilaterally

## 2011-11-20 DIAGNOSIS — M79605 Pain in left leg: Secondary | ICD-10-CM | POA: Insufficient documentation

## 2011-11-20 DIAGNOSIS — M79604 Pain in right leg: Secondary | ICD-10-CM | POA: Insufficient documentation

## 2011-11-20 NOTE — Assessment & Plan Note (Addendum)
Symptoms appear mildly improved with ppi. Will obtain CMP to exclude any biliary component. Will also obtain abdominal US. If all normal, obtain H-pylori test.

## 2011-11-20 NOTE — Assessment & Plan Note (Signed)
Refilled vicodin for 1 month for pain control

## 2011-11-20 NOTE — Assessment & Plan Note (Signed)
Report of some atypical chest pain with pain in jaw and shoulder. No active chest pain currently. Patient has appointment with her cardiologist next week.

## 2011-11-20 NOTE — Assessment & Plan Note (Signed)
Differential includes PVD with claudication vs DVT. Wells score for DVT is 1 (for previous history of clot) and therefore low risk. Will obtain venous doppler to exclude any presence of DVT. Likely claudication given description of symptoms. Will obtain ABI's.

## 2011-11-21 ENCOUNTER — Ambulatory Visit (HOSPITAL_COMMUNITY)
Admission: RE | Admit: 2011-11-21 | Discharge: 2011-11-21 | Disposition: A | Discharge status: Home / Self Care | Payer: Self-pay | Source: Ambulatory Visit | Attending: Family Medicine | Admitting: Family Medicine

## 2011-11-21 ENCOUNTER — Telehealth: Payer: Self-pay | Admitting: *Deleted

## 2011-11-21 DIAGNOSIS — I739 Peripheral vascular disease, unspecified: Secondary | ICD-10-CM

## 2011-11-21 DIAGNOSIS — M79604 Pain in right leg: Secondary | ICD-10-CM

## 2011-11-21 DIAGNOSIS — M79609 Pain in unspecified limb: Secondary | ICD-10-CM

## 2011-11-21 DIAGNOSIS — R109 Unspecified abdominal pain: Secondary | ICD-10-CM

## 2011-11-21 DIAGNOSIS — I70219 Atherosclerosis of native arteries of extremities with intermittent claudication, unspecified extremity: Secondary | ICD-10-CM | POA: Insufficient documentation

## 2011-11-21 DIAGNOSIS — M79605 Pain in left leg: Secondary | ICD-10-CM

## 2011-11-21 NOTE — Progress Notes (Signed)
VASCULAR LAB PRELIMINARY VASCULAR LAB PRELIMINARY  PRELIMINARY  PRELIMINARY  PRELIMINARY  Bilateral lower extremity venous Doppler, ABI and right arterial duplex scans completed.    Preliminary report:  There is no DVT or SVT noted in the bilateral lower extremities.  Duplex scan of the right lower extremity arterial system reveals a >50% stenosis of the proximal femoral artery and a <50% stenosis noted of the mid femoral and of the distal femoral arteries. Exercise ABI indicates a drop in the right ABI from 0.95 at rest to 0.65 after 23 seconds of Dorsi Flex.  Hakop Humbarger, 11/21/2011, 11:28 AM     ARTERIAL  ABI completed:    RIGHT    LEFT    PRESSURE WAVEFORM  PRESSURE WAVEFORM  BRACHIAL 101  T BRACHIAL 111 T  DP 105 M DP 126 B  AT   AT    PT 95 B PT 131 M  PER   PER    GREAT TOE  NA GREAT TOE  NA    RIGHT LEFT  ABI Post Exercise 0.95 0.65 1.18     . Luree Palla, 11/21/2011, 11:26 AM

## 2011-11-21 NOTE — Telephone Encounter (Signed)
Patient had appt today for lower extremity venous doppler due to PVD with claudication and hx of vascular disease.  Performed doppler today, but also needs ABI due to sxs more vascular rather than venous.  Ok per Dr. Earnest Bailey (preceptor) to add on lower extremity duplex (ABI) for claudication.  Order placed in Epic.  Gaylene Brooks, RN

## 2011-11-23 ENCOUNTER — Encounter: Payer: Self-pay | Admitting: Family Medicine

## 2011-12-02 ENCOUNTER — Encounter: Payer: Self-pay | Admitting: Cardiology

## 2011-12-02 ENCOUNTER — Encounter: Payer: Self-pay | Admitting: Pharmacist

## 2011-12-02 ENCOUNTER — Ambulatory Visit (INDEPENDENT_AMBULATORY_CARE_PROVIDER_SITE_OTHER): Payer: Self-pay | Admitting: Cardiology

## 2011-12-02 ENCOUNTER — Ambulatory Visit (INDEPENDENT_AMBULATORY_CARE_PROVIDER_SITE_OTHER): Payer: Self-pay | Admitting: Pharmacist

## 2011-12-02 VITALS — BP 122/80 | HR 94 | Ht 63.0 in | Wt 172.0 lb

## 2011-12-02 DIAGNOSIS — I7389 Other specified peripheral vascular diseases: Secondary | ICD-10-CM

## 2011-12-02 DIAGNOSIS — E785 Hyperlipidemia, unspecified: Secondary | ICD-10-CM

## 2011-12-02 DIAGNOSIS — I739 Peripheral vascular disease, unspecified: Secondary | ICD-10-CM

## 2011-12-02 DIAGNOSIS — I2510 Atherosclerotic heart disease of native coronary artery without angina pectoris: Secondary | ICD-10-CM

## 2011-12-02 MED ORDER — CILOSTAZOL 100 MG PO TABS: 100.0000 mg | ORAL_TABLET | Freq: Two times a day (BID) | ORAL | Status: DC

## 2011-12-02 NOTE — Progress Notes (Signed)
  Subjective:    Patient ID: [name], female    DOB: [birth year], 56 y.o.   MRN: [id number]  HPI Patient arrives in good spirits having been referred for ABI assessment, however, she recently had formal ABIs completed at [facility].   We discussed results of these previous tests and discussed goals of therapy in patient with abnormal ABIs.   She was congratulated on her success with tobacco cessation.  We completed a medication review and patient verbalizes adherence with all prescribed medications.         Review of Systems     Objective:   Physical Exam        Assessment & Plan:  56 yo F for ABI assessment with pharmacy. She recently underwent assessments with bilateral lower extremity venous Doppler, ABI and right arterial duplex with [facility] on 11/21/11. Her recent ABI assessment indicate a drop in the right ABI from 0.95 at rest to 0.65 after 23 seconds of Dorsi Flex.  Therefore, no ABI assessment were performed during this visit. For her medication review, we suggested a change in allergy medication. Patient was previously taking diphenhydramine per her PCP and we recommended changing to 2nd generation longer-acting antihistamine such as cetirizine 10mg , fexofenadine 180mg  or loratadine 10mg  once daily. Complete medication list provided to patient. Total time in face to face medication review: 15 minutes. Patient seen with: Tiney Rouge, PharmD Candidate and Lillia Pauls, PharmD, Pharmacy Resident.

## 2011-12-02 NOTE — Patient Instructions (Addendum)
Thank you for coming in today. Please consider an alternative to diphenhydramine for your allergies such as cetirizine (Zyrtec) 10 mg everyday, fexofenadine (Allegra) 180 mg everyday, or loratadine (Claritin) 10mg  everyday.

## 2011-12-02 NOTE — Patient Instructions (Addendum)
Start Pletal 100mg  two times a day.  You have been referred to Dr Kirke Corin for evaluation and management of your peripheral vascular disease.    Your physician recommends that you return for a FASTING lipid profile /liver profile in December 2013.  Your physician wants you to follow-up in: 6 months with Dr Shirlee Latch. ((April 2014). You will receive a reminder letter in the mail two months in advance. If you don't receive a letter, please call our office to schedule the follow-up appointment.

## 2011-12-02 NOTE — Progress Notes (Signed)
Patient ID: [name], female   DOB: [birth year], 56 y.o.   MRN: [id number] Reviewed and agree with Dr. Macky Lower management and documentation.

## 2011-12-02 NOTE — Assessment & Plan Note (Signed)
56 yo F for ABI assessment with pharmacy. She recently underwent assessments with bilateral lower extremity venous Doppler, ABI and right arterial duplex with [facility] on 11/21/11. Her recent ABI assessment indicate a drop in the right ABI from 0.95 at rest to 0.65 after 23 seconds of Dorsi Flex.  Therefore, no ABI assessment were performed during this visit. For her medication review, we suggested a change in allergy medication. Patient was previously taking diphenhydramine per her PCP and we recommended changing to 2nd generation longer-acting antihistamine such as cetirizine 10mg , fexofenadine 180mg  or loratadine 10mg  once daily. Complete medication list provided to patient. Total time in face to face medication review: 15 minutes. Patient seen with: Tiney Rouge, PharmD Candidate and Lillia Pauls, PharmD, Pharmacy Resident.

## 2011-12-04 NOTE — Progress Notes (Signed)
Patient ID: [name], female   DOB: [birth year], 56 y.o.   MRN: [id number] PCP: Dr. Gwenlyn Saran   56 yo WF with h/o PVD, CAD,  DM, HTN, and hyperlipidemia is here for followup. Steffanie Dunn was done in 6/13 and showed no ischemia or infarction.  In 10/13, she had lower extremity arterial dopplers done.  This showed > 50% stenosis in the right proximal SFA.  ABIs showed fall from 0.95 to 0.65 on the right with exercise.  Patient has rare atypical chest pain.  She does not have significant dyspnea.  She is not smoking.  Main problem is worsening of her right leg pain.  Just walking in the house gives her right calf pain.  She still tries to walk 20-30 minutes daily outside but has to stop around 4 times during the walk for calf pain.  She does occasionally wake up at night with right calf pain.  No foot ulcers.   Labs (10/11): LFTs normal Labs (4/12): LDL 73, HDL 54, triglycerides 340 Labs (9/12): TGs 335, HDL 47, LDL 55 Labs (6/13): HDL 55, LDL 55, TGs 274 Labs (10/13): K 4.5, creatinine 1.24  Allergies (verified):  1)  ! * Tetanus 2)  ! * Latex 3)  ! * Milk  Past History:  Past Medical History: 1. CAD: ETT-myoview (12/10) with apical anterior ischemia, EF 75%.  LHC (1/11) showed diffuse disease.  There was a large D3 with serial 70%, 80-90%, and 70% stenoses then severe diffuse distal vessel disease.  Small to moderate OM1 with severe diffuse disease, 50% mRCA, 95% mid small PLV, 60% PDA.  EF 60%.  Patient managed medically, no interventional options.  Lexiscan myoview (6/13) with EF 74%, no ischemia or infarction.  2. DYSLIPIDEMIA (ICD-272.4) 3. HYPERTENSION (ICD-401.9) 4. DIABETES MELLITUS, TYPE II (ICD-250.00) 5. TOBACCO ABUSE (ICD-305.1): Quit 11/10.  6. Severe osteoarthritis right knee, now s/p R TKR.  7. Asthma/COPD 8. PAD: Severe stenosis mid right SFA s/p atherectomy 03/25/09. Moderate disease diffusely throughout the iliacs, bilateral SFA, below knee vessels.  ABIs post-procedure  were 0.85 on right and 0.99 on left.  Repeat ABIs (8/11): 0.88 right, 1.2 left.  ABIs (3/12): 0.85 right, 0.99 left.  ABIs (4/13): 0.81 right, 0.98 left. Peripheral arterial dopplers (10/13) with > 50% proximal right SFA stenosis.  ABIs on right worsened from 0.95 => 0.65 with exercise.     Family History: Mother alive with CVA Father deceased with emphysema  Social History: No etoh-used to drink. Denies any IV drug use or marijuana use.   Former smoker for 30 years, quit November 2010. She is married with two children. Disabled from knee, not working.    Review of Systems        All systems reviewed and negative except as per HPI.   Current Outpatient Prescriptions  Medication Sig Dispense Refill  . albuterol (PROVENTIL HFA;VENTOLIN HFA) 108 (90 BASE) MCG/ACT inhaler Inhale 2 puffs into the lungs 2 (two) times daily as needed.  1 Inhaler  3  . aspirin 81 MG tablet Take 81 mg by mouth daily.        . bisoprolol-hydrochlorothiazide (ZIAC) 2.5-6.25 MG per tablet TAKE ONE TABLET BY MOUTH EVERY DAY  30 tablet  6  . clopidogrel (PLAVIX) 75 MG tablet Take 1 tablet (75 mg total) by mouth daily.      . diphenhydrAMINE (BENADRYL) 25 MG tablet Take 50 mg by mouth daily.      . fish oil-omega-3 fatty acids 1000 MG capsule Take  2 capsules (2 g total) by mouth 2 (two) times daily.  360 capsule  3  . Fluticasone-Salmeterol (ADVAIR DISKUS) 250-50 MCG/DOSE AEPB Inhale 1 puff into the lungs every 12 (twelve) hours.  180 each  3  . HYDROcodone-acetaminophen (VICODIN) 5-500 MG per tablet Take 1 tablet by mouth every 8 (eight) hours as needed for pain.  60 tablet  0  . isosorbide mononitrate (IMDUR) 60 MG 24 hr tablet Take 1 and 1/2 tablets daily  135 tablet  3  . lisinopril (PRINIVIL,ZESTRIL) 10 MG tablet Take 1 tablet (10 mg total) by mouth daily.  90 tablet  0  . metFORMIN (GLUCOPHAGE) 1000 MG tablet Take 1,000 mg by mouth 2 (two) times daily with a meal.      . Multiple Vitamin (MULTIVITAMIN) tablet  Take 1 tablet by mouth daily.        . rosuvastatin (CRESTOR) 40 MG tablet Take 40 mg by mouth daily.        . cilostazol (PLETAL) 100 MG tablet Take 1 tablet (100 mg total) by mouth 2 (two) times daily.  60 tablet  6  . esomeprazole (NEXIUM) 20 MG packet Take 20 mg by mouth daily before breakfast.  90 each  4  . DISCONTD: omega-3 acid ethyl esters (LOVAZA) 1 G capsule Take 2 capsules (2 g total) by mouth 2 (two) times daily.  120 capsule  11    BP 122/80  Pulse 94  Ht 5\' 3"  (1.6 m)  Wt 172 lb (78.019 kg)  BMI 30.47 kg/m2 General: NAD Neck: No JVD, no thyromegaly or thyroid nodule.  Lungs: Clear to auscultation bilaterally with normal respiratory effort. CV: Nondisplaced PMI.  Heart regular S1/S2, no S3/S4, no murmur.  No peripheral edema.  No carotid bruit.  2+ PT on left, unable to feel pulses on right.  Abdomen: Soft, nontender, no hepatosplenomegaly, no distention.  Neurologic: Alert and oriented x 3.  Psych: Normal affect. Extremities: No clubbing or cyanosis.   Assessment/Plan:  CAD  Occasional atypical chest pain. She had diffuse branch vessel disease on 2011 cath not amenable to PCI. Lexiscan myoview in 6/13 showed no ischemia or infarction. Continue ASA 81, bisoprolol, lisinopril, Imdur, Plavix, and statin. DYSLIPIDEMIA  LDL < 70 in 6/13.  PVD WITH CLAUDICATION  Symptoms have worsened recently.  Now gets some right calf pain with only mild exertion.  She wakes up at night with right calf pain. No foot ulcers. - Start cilostazol 100 mg bid.  - Peripheral vascular appointment.   Huzaifa Viney [employer]

## 2011-12-07 ENCOUNTER — Telehealth: Payer: Self-pay | Admitting: Cardiology

## 2011-12-07 NOTE — Telephone Encounter (Signed)
It should be ok.

## 2011-12-07 NOTE — Telephone Encounter (Signed)
Called patient back and advised that it was OK with Dr.McLean that she takes Plavix and Pletal.

## 2011-12-07 NOTE — Telephone Encounter (Signed)
New Problem:    Patient called in with some questions about taking her new medication and clopidogrel (PLAVIX) 75 MG tablet at the same time.  Patient did not recall the name of her new medication.  Please call back.

## 2011-12-07 NOTE — Telephone Encounter (Signed)
Spoke with pt. Pt states her pharmacist asked her to check with Dr Shirlee Latch to be sure it's OK for her to take Pletal and Plavix at the same time. Pharmacist told her they were in the same drug category. I will forward to Dr Shirlee Latch.

## 2011-12-08 ENCOUNTER — Telehealth: Payer: Self-pay | Admitting: Family Medicine

## 2011-12-08 NOTE — Telephone Encounter (Signed)
Called [facility] and left a message with the prescription information.  Told l patient that I was hopeful that this was enough.  If not she is to call us back and we can print the script and fax it to them.

## 2011-12-08 NOTE — Telephone Encounter (Signed)
Is having problems getting her nexium from [facility] - they need a script for her to get it  The company sends it to her and then she has to take it to the [facility] for it to get relabeled.  Now that she needs a refill, they say she needs a script from [place].

## 2011-12-12 ENCOUNTER — Other Ambulatory Visit: Payer: Self-pay | Admitting: Family Medicine

## 2011-12-12 ENCOUNTER — Other Ambulatory Visit: Payer: Self-pay | Admitting: Cardiology

## 2011-12-12 MED ORDER — ALBUTEROL SULFATE HFA 108 (90 BASE) MCG/ACT IN AERS: 2.0000 | INHALATION_SPRAY | Freq: Two times a day (BID) | RESPIRATORY_TRACT | Status: DC | PRN

## 2011-12-13 ENCOUNTER — Other Ambulatory Visit: Payer: Self-pay | Admitting: *Deleted

## 2011-12-13 ENCOUNTER — Other Ambulatory Visit: Payer: Self-pay | Admitting: Family Medicine

## 2011-12-13 DIAGNOSIS — I2510 Atherosclerotic heart disease of native coronary artery without angina pectoris: Secondary | ICD-10-CM

## 2011-12-13 DIAGNOSIS — I7389 Other specified peripheral vascular diseases: Secondary | ICD-10-CM

## 2011-12-13 DIAGNOSIS — R079 Chest pain, unspecified: Secondary | ICD-10-CM

## 2011-12-13 DIAGNOSIS — E785 Hyperlipidemia, unspecified: Secondary | ICD-10-CM

## 2011-12-13 MED ORDER — METFORMIN HCL 1000 MG PO TABS: 1000.0000 mg | ORAL_TABLET | Freq: Two times a day (BID) | ORAL | Status: DC

## 2011-12-14 ENCOUNTER — Encounter: Payer: Self-pay | Admitting: Cardiovascular Disease

## 2011-12-14 ENCOUNTER — Ambulatory Visit (INDEPENDENT_AMBULATORY_CARE_PROVIDER_SITE_OTHER): Payer: Self-pay | Admitting: Cardiovascular Disease

## 2011-12-14 VITALS — BP 118/70 | HR 111 | Ht 62.0 in | Wt 172.8 lb

## 2011-12-14 DIAGNOSIS — I739 Peripheral vascular disease, unspecified: Secondary | ICD-10-CM

## 2011-12-14 DIAGNOSIS — Z01812 Encounter for preprocedural laboratory examination: Secondary | ICD-10-CM

## 2011-12-14 DIAGNOSIS — I7389 Other specified peripheral vascular diseases: Secondary | ICD-10-CM

## 2011-12-14 NOTE — Assessment & Plan Note (Signed)
The patient has known history of peripheral arterial disease with previous atherectomy of the right SFA. Currently she has severe lifestyle limiting claudication in the right calf likely due to restenosis. She had significant drop in her ABI with exercise to 0.65. She is on good medical therapy and has not noticed any improvement with Pletal. Due to all of that, I recommend proceeding with abdominal aortogram, lower extremity runoff and possible angioplasty.

## 2011-12-14 NOTE — Patient Instructions (Addendum)
Your physician recommends that you return for lab work in: today (bmet, pt/inr, cbc)  You are scheduled for an abdominal aortagram with lower extremity runoff and possible angioplasty on 12/21/11. Please refer to the letter you were given for instructions.

## 2011-12-14 NOTE — Progress Notes (Signed)
HPI  56 yo WF was referred by Dr. Shirlee Latch for management of peripheral arterial disease. She has known history of peripheral arterial disease status post atherectomy of the right SFA in 2011 by Dr. Sanjuana Kava. She has other chronic medical conditions including CAD, DM, HTN, and hyperlipidemia. Steffanie Dunn was done in 6/13 and showed no ischemia or infarction. In 10/13, she had lower extremity arterial dopplers done. This showed > 50% stenosis in the right proximal SFA. ABIs showed fall from 0.95 to 0.65 on the right with exercise. Patient has rare atypical chest pain. She does not have significant dyspnea. She is a former smoker. She complains of significant discomfort in the right calf currently with minimal walking. This happens walking less than 50 feet. When she goes to her mailbox which is uphill, she has to stop at least 3 times. These symptoms have been progressive. She was started on Pletal recently by Dr. Shirlee Latch and has not noticed any improvement. She denies any rest pain or lower extremity ulceration.   Allergies  Allergen Reactions  . Glucotrol (Glipizide) Shortness Of Breath and Rash    Red rash, with breathing difficulty  . Influenza Vaccines Other (See Comments)    Significant arm soreness requiring 1 year of physical therapy  . Latex Rash  . Milk-Related Compounds Rash    Fever  . Tetanus Toxoid Hives, Swelling and Rash    Swelling to site of injection with rash and fever     Current Outpatient Prescriptions on File Prior to Visit  Medication Sig Dispense Refill  . albuterol (PROVENTIL HFA;VENTOLIN HFA) 108 (90 BASE) MCG/ACT inhaler Inhale 2 puffs into the lungs 2 (two) times daily as needed.  1 Inhaler  3  . aspirin 81 MG tablet Take 81 mg by mouth daily.        . bisoprolol-hydrochlorothiazide (ZIAC) 2.5-6.25 MG per tablet TAKE ONE TABLET BY MOUTH EVERY DAY  30 tablet  5  . cilostazol (PLETAL) 100 MG tablet Take 1 tablet (100 mg total) by mouth 2 (two) times daily.   60 tablet  6  . clopidogrel (PLAVIX) 75 MG tablet Take 1 tablet (75 mg total) by mouth daily.      . diphenhydrAMINE (BENADRYL) 25 MG tablet Take 50 mg by mouth daily.      Marland Kitchen esomeprazole (NEXIUM) 20 MG packet Take 20 mg by mouth daily before breakfast.  90 each  4  . fish oil-omega-3 fatty acids 1000 MG capsule Take 2 capsules (2 g total) by mouth 2 (two) times daily.  360 capsule  3  . Fluticasone-Salmeterol (ADVAIR DISKUS) 250-50 MCG/DOSE AEPB Inhale 1 puff into the lungs every 12 (twelve) hours.  180 each  3  . HYDROcodone-acetaminophen (VICODIN) 5-500 MG per tablet Take 1 tablet by mouth every 8 (eight) hours as needed for pain.  60 tablet  0  . isosorbide mononitrate (IMDUR) 60 MG 24 hr tablet Take 1 and 1/2 tablets daily  135 tablet  3  . lisinopril (PRINIVIL,ZESTRIL) 10 MG tablet Take 1 tablet (10 mg total) by mouth daily.  90 tablet  0  . metFORMIN (GLUCOPHAGE) 1000 MG tablet Take 1 tablet (1,000 mg total) by mouth 2 (two) times daily with a meal.  60 tablet  5  . Multiple Vitamin (MULTIVITAMIN) tablet Take 1 tablet by mouth daily.        . rosuvastatin (CRESTOR) 40 MG tablet Take 40 mg by mouth daily.        Marland Kitchen DISCONTD:  omega-3 acid ethyl esters (LOVAZA) 1 G capsule Take 2 capsules (2 g total) by mouth 2 (two) times daily.  120 capsule  11     Past Medical History  Diagnosis Date  . CAD (coronary artery disease)     ETT-myoview (12/10) with apical anterior ischemia, EF 75%.  LHC (1/11) showed diffuse disease..   There was a large D3 with serial 70%, 80-90%, and 70% stenoses then severe diffuse distal vessel disease.   Small to moderate OM1 with severe diffuse disease, 50% mRCA, 95% mid small PLV, 60% PDA.  EF 60%.  Patient managed medically, no interventional options.   . Other and unspecified hyperlipidemia 401.9    250.00  . Tobacco use disorder     quit 11/10  . Osteoarthritis     severe right knee  R TKR  . Asthma with COPD   . PAD (peripheral artery disease)     Severe  stenosis mid right SFA s/p atherectomy 03/25/09. Moderate disease diffusely throughout the  iliacs, bilateral SFA, below knee vessels.  ABIs post-procedure were 0.85 on right and 0.99 on left.   . Pancreatitis 2003     Past Surgical History  Procedure Date  . Joint replacement 2011    right knee replacement   . Tubal ligation 1980     Family History  Problem Relation Age of Onset  . Emphysema Father      History   Social History  . Marital Status: Married    Spouse Name: N/A    Number of Children: N/A  . Years of Education: N/A   Occupational History  . Not on file.   Social History Main Topics  . Smoking status: Former Smoker    Quit date: 12/13/2008  . Smokeless tobacco: Not on file  . Alcohol Use: No  . Drug Use: No  . Sexually Active: Not on file   Other Topics Concern  . Not on file   Social History Narrative   Denies any IV drug use or marijuana use.  Former smoker for 30 years, quit Nov. 2010. She is married with 2 children. Disabled from knee, not working.     PHYSICAL EXAM   BP 118/70  Pulse 111  Ht 5\' 2"  (1.575 m)  Wt 172 lb 12.8 oz (78.382 kg)  BMI 31.61 kg/m2 Constitutional: She is oriented to person, place, and time. She appears well-developed and well-nourished. No distress.  HENT: No nasal discharge.  Head: Normocephalic and atraumatic.  Eyes: Pupils are equal and round. Right eye exhibits no discharge. Left eye exhibits no discharge.  Neck: Normal range of motion. Neck supple. No JVD present. No thyromegaly present.  Cardiovascular: Normal rate, regular rhythm, normal heart sounds. Exam reveals no gallop and no friction rub. No murmur heard.  Pulmonary/Chest: Effort normal and breath sounds normal. No stridor. No respiratory distress. She has no wheezes. She has no rales. She exhibits no tenderness.  Abdominal: Soft. Bowel sounds are normal. She exhibits no distension. There is no tenderness. There is no rebound and no guarding.    Musculoskeletal: Normal range of motion. She exhibits no edema and no tenderness.  Neurological: She is alert and oriented to person, place, and time. Coordination normal.  Skin: Skin is warm and dry. No rash noted. She is not diaphoretic. No erythema. No pallor.  Psychiatric: She has a normal mood and affect. Her behavior is normal. Judgment and thought content normal.  Vascular: Femoral pulses are normal. Distal pulses are diminished on  the left side and not palpable on the right side.    ASSESSMENT AND PLAN

## 2011-12-15 ENCOUNTER — Encounter (HOSPITAL_COMMUNITY): Payer: Self-pay | Admitting: Pharmacy Technician

## 2011-12-15 LAB — BASIC METABOLIC PANEL
BUN: 26 mg/dL — ABNORMAL HIGH (ref 6–23)
Chloride: 106 mEq/L (ref 96–112)
Glucose, Bld: 87 mg/dL (ref 70–99)
Potassium: 4.3 mEq/L (ref 3.5–5.1)

## 2011-12-15 LAB — PROTIME-INR: Prothrombin Time: 10.6 s (ref 10.2–12.4)

## 2011-12-15 LAB — CBC WITH DIFFERENTIAL/PLATELET
Eosinophils Relative: 3.6 % (ref 0.0–5.0)
HCT: 37.7 % (ref 36.0–46.0)
Lymphs Abs: 2.5 10*3/uL (ref 0.7–4.0)
MCV: 88.2 fl (ref 78.0–100.0)
Monocytes Absolute: 0.3 10*3/uL (ref 0.1–1.0)
Platelets: 177 10*3/uL (ref 150.0–400.0)
RDW: 13.6 % (ref 11.5–14.6)
WBC: 7.7 10*3/uL (ref 4.5–10.5)

## 2011-12-20 ENCOUNTER — Encounter: Payer: Self-pay | Admitting: Family Medicine

## 2011-12-20 ENCOUNTER — Ambulatory Visit (INDEPENDENT_AMBULATORY_CARE_PROVIDER_SITE_OTHER): Payer: Self-pay | Admitting: Family Medicine

## 2011-12-20 VITALS — BP 116/81 | HR 108 | Ht 62.0 in | Wt 169.0 lb

## 2011-12-20 DIAGNOSIS — M25539 Pain in unspecified wrist: Secondary | ICD-10-CM

## 2011-12-20 DIAGNOSIS — M25531 Pain in right wrist: Secondary | ICD-10-CM

## 2011-12-20 DIAGNOSIS — R1013 Epigastric pain: Secondary | ICD-10-CM

## 2011-12-20 DIAGNOSIS — R112 Nausea with vomiting, unspecified: Secondary | ICD-10-CM

## 2011-12-20 DIAGNOSIS — M79605 Pain in left leg: Secondary | ICD-10-CM

## 2011-12-20 DIAGNOSIS — M79604 Pain in right leg: Secondary | ICD-10-CM

## 2011-12-20 DIAGNOSIS — M79609 Pain in unspecified limb: Secondary | ICD-10-CM

## 2011-12-20 MED ORDER — HYDROCODONE-ACETAMINOPHEN 5-500 MG PO TABS: 1.0000 | ORAL_TABLET | Freq: Two times a day (BID) | ORAL | Status: DC | PRN

## 2011-12-20 NOTE — Patient Instructions (Signed)
The wrist pain is likely a flare of tendinitis. Continue with the pain medicine. Ice the area and give your wrist a little rest.   For the skin rash, it is likely from dry skin. You can continue with the over the counter steroid cream.

## 2011-12-20 NOTE — Progress Notes (Signed)
Patient ID: [name]    DOB: 27-Feb-1955, 56 y.o.   MRN: [id number] --- Subjective:  [name] is a 56 y.o.female with h/o CAD, PAD, type 2 diabetes who presents for follow up. Concerns are the following: - right wrist pain: x2weeks. Doesn't recall falling on it or injuring it. She crochets which does tend to be repetitive movement of her wrist. She had been diagnosed with tendinitis in the past for which she wears a wrist guard. She doesn't feel like this has helped her much recently. She denies any swelling. Denies any tingling or numbness. It hurts to grab objects. No tingling or numbness. Weakness only from pain.  - right leg pain worst than left: has been evaluated by vascular surgery for significant PAD which has not responded to pletal. She is scheduled for abdominal aortogram, lower extremity runoff and possible angioplasty tomorrow (12/21/11) - abdominal pain: improved since starting the nexium. Has 2 episodes per week as opposed to every day. It is associated with non bloody vomiting when it occurs, not associated with eating. No diarrhea or constipation. No fevers.    ROS: see HPI Past Medical History: reviewed and updated medications and allergies. Social History: Tobacco: former smoker  Objective: Filed Vitals:   12/20/11 1332  BP: 116/81  Pulse: 108    Physical Examination:   General appearance - alert, well appearing, and in no distress Chest - clear to auscultation, no wheezes, rales or rhonchi, symmetric air entry Heart - normal rate, regular rhythm, normal S1, S2, no murmurs, rubs, clicks or gallops Abdomen - soft, nontender, nondistended, no masses or organomegaly Extremities - no pedal edema right wrist: tenderness along tendon on lateral aspect of wrist, negative tinnel and phallen. Discomfort with flexion of 2nd and 3rd fingers secondary to pain. No bony tenderness.

## 2011-12-21 ENCOUNTER — Ambulatory Visit (HOSPITAL_COMMUNITY)
Admission: RE | Admit: 2011-12-21 | Discharge: 2011-12-21 | Disposition: A | Discharge status: Home / Self Care | Payer: Self-pay | Source: Ambulatory Visit | Attending: Cardiovascular Disease | Admitting: Cardiovascular Disease

## 2011-12-21 ENCOUNTER — Encounter (HOSPITAL_COMMUNITY)
Admission: RE | Disposition: A | Discharge status: Home / Self Care | Payer: Self-pay | Source: Ambulatory Visit | Attending: Cardiovascular Disease

## 2011-12-21 DIAGNOSIS — I70219 Atherosclerosis of native arteries of extremities with intermittent claudication, unspecified extremity: Secondary | ICD-10-CM | POA: Insufficient documentation

## 2011-12-21 DIAGNOSIS — I2510 Atherosclerotic heart disease of native coronary artery without angina pectoris: Secondary | ICD-10-CM | POA: Insufficient documentation

## 2011-12-21 DIAGNOSIS — M1710 Unilateral primary osteoarthritis, unspecified knee: Secondary | ICD-10-CM | POA: Insufficient documentation

## 2011-12-21 DIAGNOSIS — J449 Chronic obstructive pulmonary disease, unspecified: Secondary | ICD-10-CM | POA: Insufficient documentation

## 2011-12-21 DIAGNOSIS — J4489 Other specified chronic obstructive pulmonary disease: Secondary | ICD-10-CM | POA: Insufficient documentation

## 2011-12-21 DIAGNOSIS — E785 Hyperlipidemia, unspecified: Secondary | ICD-10-CM | POA: Insufficient documentation

## 2011-12-21 HISTORY — PX: ABDOMINAL AORTAGRAM: SHX5454

## 2011-12-21 LAB — GLUCOSE, CAPILLARY: Glucose-Capillary: 166 mg/dL — ABNORMAL HIGH (ref 70–99)

## 2011-12-21 SURGERY — ABDOMINAL AORTAGRAM
Anesthesia: LOCAL | Laterality: Right

## 2011-12-21 MED ORDER — MORPHINE SULFATE 2 MG/ML IJ SOLN
INTRAMUSCULAR | Status: AC
  Filled 2011-12-21: qty 1

## 2011-12-21 MED ORDER — SODIUM CHLORIDE 0.9 % IV SOLN: INTRAVENOUS | Status: DC

## 2011-12-21 MED ORDER — SODIUM CHLORIDE 0.9 % IV SOLN: 250.0000 mL | INTRAVENOUS | Status: DC | PRN

## 2011-12-21 MED ORDER — SODIUM CHLORIDE 0.9 % IJ SOLN: 3.0000 mL | INTRAMUSCULAR | Status: DC | PRN

## 2011-12-21 MED ORDER — MIDAZOLAM HCL 2 MG/2ML IJ SOLN
INTRAMUSCULAR | Status: AC
  Filled 2011-12-21: qty 2

## 2011-12-21 MED ORDER — FENTANYL CITRATE 0.05 MG/ML IJ SOLN
INTRAMUSCULAR | Status: AC
  Filled 2011-12-21: qty 2

## 2011-12-21 MED ORDER — MORPHINE SULFATE 4 MG/ML IJ SOLN
2.0000 mg | INTRAMUSCULAR | Status: DC | PRN
  Administered 2011-12-21: 2 mg via INTRAVENOUS

## 2011-12-21 MED ORDER — SODIUM CHLORIDE 0.9 % IJ SOLN: 3.0000 mL | Freq: Two times a day (BID) | INTRAMUSCULAR | Status: DC

## 2011-12-21 MED ORDER — SODIUM CHLORIDE 0.9 % IV SOLN
INTRAVENOUS | Status: DC
  Administered 2011-12-21: 08:00:00 via INTRAVENOUS

## 2011-12-21 MED ORDER — LIDOCAINE HCL (PF) 1 % IJ SOLN
INTRAMUSCULAR | Status: AC
  Filled 2011-12-21: qty 60

## 2011-12-21 MED ORDER — ASPIRIN 81 MG PO CHEW: 324.0000 mg | CHEWABLE_TABLET | ORAL | Status: DC

## 2011-12-21 MED ORDER — ASPIRIN 81 MG PO CHEW
324.0000 mg | CHEWABLE_TABLET | ORAL | Status: AC
  Administered 2011-12-21: 324 mg via ORAL
  Filled 2011-12-21: qty 4

## 2011-12-21 MED ORDER — ACETAMINOPHEN 325 MG PO TABS: 650.0000 mg | ORAL_TABLET | ORAL | Status: DC | PRN

## 2011-12-21 MED ORDER — HEPARIN (PORCINE) IN NACL 2-0.9 UNIT/ML-% IJ SOLN
INTRAMUSCULAR | Status: AC
  Filled 2011-12-21: qty 1000

## 2011-12-21 NOTE — CV Procedure (Signed)
     PERIPHERAL VASCULAR PROCEDURE  NAME:  Jakera Beaupre   MRN: [id number] DOB:  1955-10-25   ADMIT DATE: 12/21/2011  Performing Cardiologist: Lorine Bears Primary Physician: Marena Chancy, MD Primary Cardiologist:  Dr. Shirlee Latch.  Procedures Performed:  Abdominal Aortic Angiogram .  Selective right lower extremity angiography via the contralateral approach.  Indication(s):   Claudication   Consent: The procedure with Risks/Benefits/Alternatives and Indications was reviewed with the patient.  All questions were answered.  Medications:  Sedation:  1 mg IV Versed, 100 mcg IV Fentanyl  Contrast:  90 ML Visipaque   Procedural details: The left groin was prepped, draped, and anesthetized with 1% lidocaine. Using modified Seldinger technique, a 5 French sheath was introduced into the left common femoral artery. A 5 Fr Short Pigtail Catheter was advanced of over a  Versicore wire into the descending Aorta above the iliac bifurcation. A power injection of 57ml/sec contrast over 1 sec was performed for Abdominal Aortic Angiography.    The pigtail catheter was a change over the versicolor wire for A crossover catheter which was then pulled back the aortic bifurcation and the wire was advanced down the contralateral common iliac artery. The wire was going towards anterior iliac artery. Thus, a Glidewire was used to direct towards the external iliac artery. The catheter was exchanged into an endhole catheter which was advanced to the right external iliac artery. Contralateral second-order lower extremity angiography was performed via power injection in a step fashion.  Hemodynamics:  Central Aortic Pressure / Mean Aortic Pressure: 104/61  Findings:  Abdominal aorta: The distal aorta is overall small in size with mild atherosclerosis but no obstructive disease.  Right common iliac artery: Normal in size with minor irregularities.  Right internal iliac artery: Normal in size with mild  diffuse atherosclerosis.  Right external iliac artery: No significant disease.  Right common femoral artery: 20% disease.  Right profunda femoral artery: Normal in size with mild 20% proximal disease.  Right superficial femoral artery: Moderately calcified throughout its course. There is diffuse 40-50 % disease in the proximal segment at the site of previous atherectomy. There is diffuse 30% disease in the mid and distal SFA.  Right popliteal artery: Diffuse 20% disease.  Three-vessel runoff to the knee with sluggish flow as noted on previous angiography.  Left common iliac artery:  40% ostial stenosis.  Left internal iliac artery: No significant obstructive disease.  Left external iliac artery: Normal.  Conclusions: 1. No significant aortoiliac disease. 2. Diffuse moderate disease in the right SFA at the site of prior atherectomy. No evidence of high-grade stenosis. 3. Three-vessel runoff below the knee with very sluggish flow as needed on prior studies likely represents small vessel disease.  Recommendations:  Continue medical therapy.   Lorine Bears, MD, [facility] 12/21/2011 10:09 AM

## 2011-12-21 NOTE — H&P (View-Only) (Signed)
  HPI  56 yo WF was referred by Dr. McLean for management of peripheral arterial disease. She has known history of peripheral arterial disease status post atherectomy of the right SFA in 2011 by Dr. McAlhaney. She has other chronic medical conditions including CAD, DM, HTN, and hyperlipidemia. Lexiscan myoview was done in 6/13 and showed no ischemia or infarction. In 10/13, she had lower extremity arterial dopplers done. This showed > 50% stenosis in the right proximal SFA. ABIs showed fall from 0.95 to 0.65 on the right with exercise. Patient has rare atypical chest pain. She does not have significant dyspnea. She is a former smoker. She complains of significant discomfort in the right calf currently with minimal walking. This happens walking less than 50 feet. When she goes to her mailbox which is uphill, she has to stop at least 3 times. These symptoms have been progressive. She was started on Pletal recently by Dr. McLean and has not noticed any improvement. She denies any rest pain or lower extremity ulceration.   Allergies  Allergen Reactions  . Glucotrol (Glipizide) Shortness Of Breath and Rash    Red rash, with breathing difficulty  . Influenza Vaccines Other (See Comments)    Significant arm soreness requiring 1 year of physical therapy  . Latex Rash  . Milk-Related Compounds Rash    Fever  . Tetanus Toxoid Hives, Swelling and Rash    Swelling to site of injection with rash and fever     Current Outpatient Prescriptions on File Prior to Visit  Medication Sig Dispense Refill  . albuterol (PROVENTIL HFA;VENTOLIN HFA) 108 (90 BASE) MCG/ACT inhaler Inhale 2 puffs into the lungs 2 (two) times daily as needed.  1 Inhaler  3  . aspirin 81 MG tablet Take 81 mg by mouth daily.        . bisoprolol-hydrochlorothiazide (ZIAC) 2.5-6.25 MG per tablet TAKE ONE TABLET BY MOUTH EVERY DAY  30 tablet  5  . cilostazol (PLETAL) 100 MG tablet Take 1 tablet (100 mg total) by mouth 2 (two) times daily.   60 tablet  6  . clopidogrel (PLAVIX) 75 MG tablet Take 1 tablet (75 mg total) by mouth daily.      . diphenhydrAMINE (BENADRYL) 25 MG tablet Take 50 mg by mouth daily.      . esomeprazole (NEXIUM) 20 MG packet Take 20 mg by mouth daily before breakfast.  90 each  4  . fish oil-omega-3 fatty acids 1000 MG capsule Take 2 capsules (2 g total) by mouth 2 (two) times daily.  360 capsule  3  . Fluticasone-Salmeterol (ADVAIR DISKUS) 250-50 MCG/DOSE AEPB Inhale 1 puff into the lungs every 12 (twelve) hours.  180 each  3  . HYDROcodone-acetaminophen (VICODIN) 5-500 MG per tablet Take 1 tablet by mouth every 8 (eight) hours as needed for pain.  60 tablet  0  . isosorbide mononitrate (IMDUR) 60 MG 24 hr tablet Take 1 and 1/2 tablets daily  135 tablet  3  . lisinopril (PRINIVIL,ZESTRIL) 10 MG tablet Take 1 tablet (10 mg total) by mouth daily.  90 tablet  0  . metFORMIN (GLUCOPHAGE) 1000 MG tablet Take 1 tablet (1,000 mg total) by mouth 2 (two) times daily with a meal.  60 tablet  5  . Multiple Vitamin (MULTIVITAMIN) tablet Take 1 tablet by mouth daily.        . rosuvastatin (CRESTOR) 40 MG tablet Take 40 mg by mouth daily.        . DISCONTD:   omega-3 acid ethyl esters (LOVAZA) 1 G capsule Take 2 capsules (2 g total) by mouth 2 (two) times daily.  120 capsule  11     Past Medical History  Diagnosis Date  . CAD (coronary artery disease)     ETT-myoview (12/10) with apical anterior ischemia, EF 75%.  LHC (1/11) showed diffuse disease..   There was a large D3 with serial 70%, 80-90%, and 70% stenoses then severe diffuse distal vessel disease.   Small to moderate OM1 with severe diffuse disease, 50% mRCA, 95% mid small PLV, 60% PDA.  EF 60%.  Patient managed medically, no interventional options.   . Other and unspecified hyperlipidemia 401.9    250.00  . Tobacco use disorder     quit 11/10  . Osteoarthritis     severe right knee  R TKR  . Asthma with COPD   . PAD (peripheral artery disease)     Severe  stenosis mid right SFA s/p atherectomy 03/25/09. Moderate disease diffusely throughout the  iliacs, bilateral SFA, below knee vessels.  ABIs post-procedure were 0.85 on right and 0.99 on left.   . Pancreatitis 2003     Past Surgical History  Procedure Date  . Joint replacement 2011    right knee replacement   . Tubal ligation 1980     Family History  Problem Relation Age of Onset  . Emphysema Father      History   Social History  . Marital Status: Married    Spouse Name: N/A    Number of Children: N/A  . Years of Education: N/A   Occupational History  . Not on file.   Social History Main Topics  . Smoking status: Former Smoker    Quit date: 12/13/2008  . Smokeless tobacco: Not on file  . Alcohol Use: No  . Drug Use: No  . Sexually Active: Not on file   Other Topics Concern  . Not on file   Social History Narrative   Denies any IV drug use or marijuana use.  Former smoker for 30 years, quit Nov. 2010. She is married with 2 children. Disabled from knee, not working.     PHYSICAL EXAM   BP 118/70  Pulse 111  Ht 5' 2" (1.575 m)  Wt 172 lb 12.8 oz (78.382 kg)  BMI 31.61 kg/m2 Constitutional: She is oriented to person, place, and time. She appears well-developed and well-nourished. No distress.  HENT: No nasal discharge.  Head: Normocephalic and atraumatic.  Eyes: Pupils are equal and round. Right eye exhibits no discharge. Left eye exhibits no discharge.  Neck: Normal range of motion. Neck supple. No JVD present. No thyromegaly present.  Cardiovascular: Normal rate, regular rhythm, normal heart sounds. Exam reveals no gallop and no friction rub. No murmur heard.  Pulmonary/Chest: Effort normal and breath sounds normal. No stridor. No respiratory distress. She has no wheezes. She has no rales. She exhibits no tenderness.  Abdominal: Soft. Bowel sounds are normal. She exhibits no distension. There is no tenderness. There is no rebound and no guarding.    Musculoskeletal: Normal range of motion. She exhibits no edema and no tenderness.  Neurological: She is alert and oriented to person, place, and time. Coordination normal.  Skin: Skin is warm and dry. No rash noted. She is not diaphoretic. No erythema. No pallor.  Psychiatric: She has a normal mood and affect. Her behavior is normal. Judgment and thought content normal.  Vascular: Femoral pulses are normal. Distal pulses are diminished on   the left side and not palpable on the right side.    ASSESSMENT AND PLAN   

## 2011-12-21 NOTE — Interval H&P Note (Signed)
History and Physical Interval Note:  12/21/2011 9:11 AM  [name]  has presented today for surgery, with the diagnosis of pad  The various methods of treatment have been discussed with the patient and family. After consideration of risks, benefits and other options for treatment, the patient has consented to  Procedure(s) (LRB) with comments: ABDOMINAL AORTAGRAM (N/A) as a surgical intervention .  The patient's history has been reviewed, patient examined, no change in status, stable for surgery.  I have reviewed the patient's chart and labs.  Questions were answered to the patient's satisfaction.     Lorine Bears

## 2011-12-23 DIAGNOSIS — M25531 Pain in right wrist: Secondary | ICD-10-CM | POA: Insufficient documentation

## 2011-12-23 NOTE — Assessment & Plan Note (Signed)
Tendinitis vs sprain. No evidence of fracture. Treat symptomatically with heat/ice and tylenol. With stomach pain, would avoid NSAID's. Patient allergic to capsaicin and aspecreme. Continue wrist brace and recommend stopping repetitive wrist movement for now.

## 2011-12-23 NOTE — Assessment & Plan Note (Signed)
lipae and CMP previously obtained are normal. US abdomen was normal as well. Will get H-pylori test.  Otherwise continue PPI therapy which appears to be helping. Follow up symptoms at next visit.

## 2011-12-23 NOTE — Assessment & Plan Note (Addendum)
Likely from PAD although patient has arthritis in knees which could also be contributing. On pletal which has unofrtunately not provided significant relief. Patient scheduled for lower extremity arteriogram.  Refill vicodin for pain relief.

## 2012-01-04 ENCOUNTER — Ambulatory Visit (INDEPENDENT_AMBULATORY_CARE_PROVIDER_SITE_OTHER): Payer: Self-pay | Admitting: Cardiovascular Disease

## 2012-01-04 ENCOUNTER — Encounter: Payer: Self-pay | Admitting: Cardiovascular Disease

## 2012-01-04 VITALS — BP 110/72 | HR 104

## 2012-01-04 DIAGNOSIS — I7389 Other specified peripheral vascular diseases: Secondary | ICD-10-CM

## 2012-01-04 NOTE — Progress Notes (Signed)
[name] was checked in and waiting in the exam room.  Because Dr Jari Sportsman hospital procedures were running late, she left without being seen.  She stated that she was doing well and had to get her husband to an appt and as a result was unable to wait.  Dr Kirke Corin was given a message to call her.

## 2012-01-05 ENCOUNTER — Encounter: Payer: Self-pay | Admitting: Cardiovascular Disease

## 2012-01-17 ENCOUNTER — Other Ambulatory Visit: Payer: Self-pay

## 2012-01-25 ENCOUNTER — Ambulatory Visit: Payer: Self-pay | Admitting: Cardiovascular Disease

## 2012-01-31 ENCOUNTER — Other Ambulatory Visit (INDEPENDENT_AMBULATORY_CARE_PROVIDER_SITE_OTHER): Payer: Self-pay

## 2012-01-31 DIAGNOSIS — I739 Peripheral vascular disease, unspecified: Secondary | ICD-10-CM

## 2012-01-31 DIAGNOSIS — I2510 Atherosclerotic heart disease of native coronary artery without angina pectoris: Secondary | ICD-10-CM

## 2012-01-31 LAB — LIPID PANEL
Cholesterol: 114 mg/dL (ref 0–200)
HDL: 41.2 mg/dL (ref >39.00)
Total CHOL/HDL Ratio: 3
Triglycerides: 207 mg/dL — ABNORMAL HIGH (ref 0.0–149.0)
VLDL: 41.4 mg/dL — ABNORMAL HIGH (ref 0.0–40.0)

## 2012-01-31 LAB — LDL CHOLESTEROL, DIRECT: Direct LDL: 53.6 mg/dL

## 2012-01-31 LAB — HEPATIC FUNCTION PANEL
Albumin: 4 g/dL (ref 3.5–5.2)
Total Bilirubin: 0.5 mg/dL (ref 0.3–1.2)

## 2012-02-01 ENCOUNTER — Ambulatory Visit (INDEPENDENT_AMBULATORY_CARE_PROVIDER_SITE_OTHER): Payer: Self-pay | Admitting: Cardiovascular Disease

## 2012-02-01 ENCOUNTER — Encounter: Payer: Self-pay | Admitting: Cardiovascular Disease

## 2012-02-01 VITALS — BP 138/80 | HR 105 | Ht 62.5 in | Wt 170.4 lb

## 2012-02-01 DIAGNOSIS — I7389 Other specified peripheral vascular diseases: Secondary | ICD-10-CM

## 2012-02-01 NOTE — Patient Instructions (Addendum)
Your physician wants you to follow-up in: 6 months.   You will receive a reminder letter in the mail two months in advance. If you don't receive a letter, please call our office to schedule the follow-up appointment.  Your physician has requested that you have an ankle brachial index (ABI) 1-2 weeks before your next appt. During this test an ultrasound and blood pressure cuff are used to evaluate the arteries that supply the arms and legs with blood. Allow thirty minutes for this exam. There are no restrictions or special instructions.  Your physician has requested that you have a lower or upper extremity arterial duplex 1-2 weeks before your next appt. This test is an ultrasound of the arteries in the legs or arms. It looks at arterial blood flow in the legs and arms. Allow one hour for Lower and Upper Arterial scans. There are no restrictions or special instructions

## 2012-02-02 ENCOUNTER — Telehealth: Payer: Self-pay | Admitting: Cardiology

## 2012-02-02 NOTE — Telephone Encounter (Signed)
I spoke with [name] and she needs an [facility] her Advair and Lovaza. She can get these medications more economically through their assistance program. Aware [name] & Dr. Shirlee Latch in office tomorrow. Mylo Red RN

## 2012-02-02 NOTE — Telephone Encounter (Signed)
New Problem:    Patient called in needing her [place] and Fluticasone-Salmeterol (ADVAIR DISKUS) 250-50 MCG/DOSE AEPB called into the [facility] department [phone] or fax- [phone] for their medication assistance program.  Please call back.

## 2012-02-03 MED ORDER — OMEGA-3 FATTY ACIDS 1000 MG PO CAPS: 2.0000 g | ORAL_CAPSULE | Freq: Two times a day (BID) | ORAL | Status: DC

## 2012-02-03 MED ORDER — ALBUTEROL SULFATE HFA 108 (90 BASE) MCG/ACT IN AERS: 2.0000 | INHALATION_SPRAY | Freq: Two times a day (BID) | RESPIRATORY_TRACT | Status: DC | PRN

## 2012-02-03 NOTE — Telephone Encounter (Signed)
Pt aware prescriptions for Lovaza and Advair [facility] today [phone].

## 2012-02-09 ENCOUNTER — Encounter: Payer: Self-pay | Admitting: Cardiovascular Disease

## 2012-02-09 NOTE — Progress Notes (Signed)
HPI  56 yo WF who is here for a follow up visit regarding peripheral arterial disease. She has known history of peripheral arterial disease status post atherectomy of the right SFA in 2011 by Dr. Sanjuana Kava. She has other chronic medical conditions including CAD, DM, HTN, and hyperlipidemia.  Doppler showed > 50% stenosis in the right proximal SFA. ABIs showed fall from 0.95 to 0.65 on the right with exercise. She is a former smoker. She had significant discomfort in the right calf  with minimal walking less than 50 feet in spite of being on optimal medications. Thus, I decided to proceed with angiography which showed moderate diffuse disease in R SFA (at most 50%). Three vessel run below the knee but with sluggish flow distally. She continues to complain of lifestyle limiting claudication in both calves but worse on the right.   Allergies  Allergen Reactions  . Glucotrol (Glipizide) Shortness Of Breath and Rash    Red rash, with breathing difficulty  . Influenza Vaccines Other (See Comments)    Significant arm soreness requiring 1 year of physical therapy  . Latex Rash  . Milk-Related Compounds Rash    Fever  . Tetanus Toxoid Hives, Swelling and Rash    Swelling to site of injection with rash and fever     Current Outpatient Prescriptions on File Prior to Visit  Medication Sig Dispense Refill  . aspirin 81 MG tablet Take 81 mg by mouth daily.        . bisoprolol-hydrochlorothiazide (ZIAC) 2.5-6.25 MG per tablet Take 1 tablet by mouth daily.      . cilostazol (PLETAL) 100 MG tablet Take 100 mg by mouth 2 (two) times daily.       . clopidogrel (PLAVIX) 75 MG tablet Take 1 tablet (75 mg total) by mouth daily.      Marland Kitchen esomeprazole (NEXIUM) 20 MG packet Take 20 mg by mouth daily before breakfast.  90 each  4  . Fluticasone-Salmeterol (ADVAIR DISKUS) 250-50 MCG/DOSE AEPB Inhale 1 puff into the lungs every 12 (twelve) hours.  180 each  3  . HYDROcodone-acetaminophen (VICODIN) 5-500 MG per  tablet Take 1 tablet by mouth 2 (two) times daily as needed. For pain  60 tablet  1  . isosorbide mononitrate (IMDUR) 60 MG 24 hr tablet Take 60 mg by mouth daily.       Marland Kitchen lisinopril (PRINIVIL,ZESTRIL) 10 MG tablet Take 10 mg by mouth at bedtime.      . metFORMIN (GLUCOPHAGE) 1000 MG tablet Take 1,000 mg by mouth 2 (two) times daily with a meal.      . Multiple Vitamin (MULTIVITAMIN) tablet Take 1 tablet by mouth daily.        . rosuvastatin (CRESTOR) 40 MG tablet Take 40 mg by mouth at bedtime.       Marland Kitchen albuterol (PROVENTIL HFA;VENTOLIN HFA) 108 (90 BASE) MCG/ACT inhaler Inhale 2 puffs into the lungs 2 (two) times daily as needed. For shortness of breath  3 Inhaler  3  . fish oil-omega-3 fatty acids 1000 MG capsule Take 2 capsules (2 g total) by mouth 2 (two) times daily.  360 capsule  3  . [DISCONTINUED] omega-3 acid ethyl esters (LOVAZA) 1 G capsule Take 2 capsules (2 g total) by mouth 2 (two) times daily.  120 capsule  11     Past Medical History  Diagnosis Date  . CAD (coronary artery disease)     ETT-myoview (12/10) with apical anterior ischemia, EF 75%.  LHC (1/11) showed diffuse disease..   There was a large D3 with serial 70%, 80-90%, and 70% stenoses then severe diffuse distal vessel disease.   Small to moderate OM1 with severe diffuse disease, 50% mRCA, 95% mid small PLV, 60% PDA.  EF 60%.  Patient managed medically, no interventional options.   . Other and unspecified hyperlipidemia 401.9    250.00  . Tobacco use disorder     quit 11/10  . Osteoarthritis     severe right knee  R TKR  . Asthma with COPD   . PAD (peripheral artery disease)     Severe stenosis mid right SFA s/p atherectomy 03/25/09. Moderate disease diffusely throughout the  iliacs, bilateral SFA, below knee vessels.  ABIs post-procedure were 0.85 on right and 0.99 on left.   . Pancreatitis 2003     Past Surgical History  Procedure Date  . Joint replacement 2011    right knee replacement   . Tubal ligation  1980     Family History  Problem Relation Age of Onset  . Emphysema Father      History   Social History  . Marital Status: Married    Spouse Name: N/A    Number of Children: N/A  . Years of Education: N/A   Occupational History  . Not on file.   Social History Main Topics  . Smoking status: Former Smoker    Quit date: 12/13/2008  . Smokeless tobacco: Not on file  . Alcohol Use: No  . Drug Use: No  . Sexually Active: Not on file   Other Topics Concern  . Not on file   Social History Narrative   Denies any IV drug use or marijuana use.  Former smoker for 30 years, quit Nov. 2010. She is married with 2 children. Disabled from knee, not working.     PHYSICAL EXAM   BP 138/80  Pulse 105  Ht 5' 2.5" (1.588 m)  Wt 170 lb 6.4 oz (77.293 kg)  BMI 30.67 kg/m2  SpO2 95% Constitutional: She is oriented to person, place, and time. She appears well-developed and well-nourished. No distress.  HENT: No nasal discharge.  Head: Normocephalic and atraumatic.  Eyes: Pupils are equal and round. Right eye exhibits no discharge. Left eye exhibits no discharge.  Neck: Normal range of motion. Neck supple. No JVD present. No thyromegaly present.  Cardiovascular: Normal rate, regular rhythm, normal heart sounds. Exam reveals no gallop and no friction rub. No murmur heard.  Pulmonary/Chest: Effort normal and breath sounds normal. No stridor. No respiratory distress. She has no wheezes. She has no rales. She exhibits no tenderness.  Abdominal: Soft. Bowel sounds are normal. She exhibits no distension. There is no tenderness. There is no rebound and no guarding.  Musculoskeletal: Normal range of motion. She exhibits no edema and no tenderness.  Neurological: She is alert and oriented to person, place, and time. Coordination normal.  Skin: Skin is warm and dry. No rash noted. She is not diaphoretic. No erythema. No pallor.  Psychiatric: She has a normal mood and affect. Her behavior is  normal. Judgment and thought content normal.  Vascular: Femoral pulses are normal. Distal pulses are diminished on the left side and not palpable on the right side.    ASSESSMENT AND PLAN

## 2012-02-09 NOTE — Assessment & Plan Note (Signed)
She continues to complain of significant claudication. Recent angiography showed only moderate SFA disease but there was sluggish flow below the knees bilaterally. I think there are few possibilities. It is possible that the SFA disease might be physiologically occlusive in spite of angiographic appearance of being moderate. The possibility is small vessel disease and endothelial dysfunction.  I will continue medical therapy for now. I encouraged to follow a walking program to improve claudication distance.  I will repeat LE arterial doppler and ABI in 6 months and follow up at that time.

## 2012-02-10 ENCOUNTER — Other Ambulatory Visit: Payer: Self-pay | Admitting: Cardiology

## 2012-02-17 ENCOUNTER — Other Ambulatory Visit: Payer: Self-pay | Admitting: Family Medicine

## 2012-02-17 NOTE — Telephone Encounter (Signed)
Needs a refill on her hydrocodone (she will be out today) - states that pharmacy has sent request- - [facility]

## 2012-02-17 NOTE — Telephone Encounter (Signed)
Will fwd. To PCP for review .Mayley Lish  

## 2012-02-17 NOTE — Telephone Encounter (Signed)
Called the [facility] pharmacy and left a messagor for Rx for vicodin 5/500mg  1 tablet bid/prn for 60 tablets and 0 refills.

## 2012-03-13 ENCOUNTER — Encounter: Payer: Self-pay | Admitting: Family Medicine

## 2012-03-13 ENCOUNTER — Ambulatory Visit (INDEPENDENT_AMBULATORY_CARE_PROVIDER_SITE_OTHER): Payer: No Typology Code available for payment source | Admitting: Family Medicine

## 2012-03-13 VITALS — BP 124/70 | HR 101 | Ht 62.5 in | Wt 171.0 lb

## 2012-03-13 DIAGNOSIS — I7389 Other specified peripheral vascular diseases: Secondary | ICD-10-CM

## 2012-03-13 DIAGNOSIS — E119 Type 2 diabetes mellitus without complications: Secondary | ICD-10-CM

## 2012-03-13 LAB — POCT GLYCOSYLATED HEMOGLOBIN (HGB A1C): Hemoglobin A1C: 6.9

## 2012-03-13 MED ORDER — HYDROCODONE-ACETAMINOPHEN 5-500 MG PO TABS: 1.0000 | ORAL_TABLET | Freq: Three times a day (TID) | ORAL | Status: DC | PRN

## 2012-03-13 NOTE — Patient Instructions (Addendum)
For the leg pain, let's try adding a pain medicine dose in the middle of the day, to allow you to exercise. You can take up to 3000mg  of tylenol in 24 hrs.  I will see you back in 3 months.

## 2012-03-14 NOTE — Assessment & Plan Note (Signed)
A1C today at 6.9. Continue metformin 1000mg  bid. Talked about diet and exercise. Patient to increase exercise for both DM2 and PVD.  If increased A1C in 3 months, will add glipizide.

## 2012-03-14 NOTE — Assessment & Plan Note (Addendum)
Patient actively trying to increase walking and adopt exercise program to help with claudication. Current pain medication regimen not adequately treating her pain. I think it is reasonable to add an extra dose of vicodin to hold her over during the day when she exercises. Continue pletal and asa per cards. Rx for hydrocodone/tylenol 5/500 tid/prn #90 tabs.

## 2012-03-14 NOTE — Progress Notes (Signed)
Patient ID: [name]    DOB: [birth year], 57 y.o.   MRN: [id number] --- Subjective:  [name] is a 57 y.o.female with h/o CAD, PAD, type 2 diabetes who presents for follow up. Concerns are the following: - bilateral leg pain: R>L. Has been evaluated by Dr. Kirke Corin with cardiology for PVD with claudication which is the likely source of her pain. Thought to be from small vessel disease and endothelial dysfunction. Continued on pletal and follow up with LE arterial doppler and ABI in 6 months.  She states that she has been trying to walk more in the last couple of weeks to follow her cardiologist's recommendation. This has increased her leg pain, which she describes as tight like a rock, worst with walking, standing in the shower. She walks 1/4 mile and needs to stop 4 times and wait a few minutes before starting again. She used to do zumba up unto November, but had to stop due to pain.  Taking vicodin once in the morning at 6:30am. Takes new dose at 4-5PM. With her increased walking, pain has not been well controled.   - DM: taking metformin 1000mg  bid. No hypoglycemic episodes. No lower extremity sores or ulcers. No paresthesias. No change in vision.   ROS: see HPI Past Medical History: reviewed and updated medications and allergies. Social History: Tobacco: former smoker  Objective: Filed Vitals:   03/13/12 1351  BP: 124/70  Pulse: 101    Physical Examination:   General appearance - alert, well appearing, and in no distress Chest - clear to auscultation, no wheezes, rales or rhonchi, symmetric air entry Heart - normal rate, regular rhythm, normal S1, S2, no murmurs, rubs, clicks or gallops Abdomen - soft, nontender, nondistended, no masses or organomegaly Extremities - no pedal edema, soreness with compression of calves bilaterally, +1 dorsalis pedis pulse in left foot, left DP pulse difficult to palpate.  Crepitus with extension of left knee. Some pain with extension of left knee. 4+/5  strength with knee flexion and extension

## 2012-03-20 ENCOUNTER — Other Ambulatory Visit: Payer: Self-pay | Admitting: Family Medicine

## 2012-03-20 NOTE — Telephone Encounter (Signed)
Refilled proventil HFA with 1 refill. Sent fax to pharmacy at [facility].

## 2012-04-12 ENCOUNTER — Telehealth: Payer: Self-pay | Admitting: *Deleted

## 2012-04-12 NOTE — Telephone Encounter (Signed)
Pt called and reports that the [facility] wants Dr.Losq to call [phone]) to change her Vicodin 5/500 to a different strength that would be available. 5/500 not available any  More. Lorenda Hatchet, Renato Battles

## 2012-04-12 NOTE — Telephone Encounter (Signed)
Changed vicodin from 5/500 to 5/325 and sent fax to pharmacy.  vicodin 5/325 tid/prn 90tabs 2 refills

## 2012-04-12 NOTE — Telephone Encounter (Signed)
Pt called again

## 2012-04-19 ENCOUNTER — Telehealth: Payer: Self-pay | Admitting: *Deleted

## 2012-04-19 NOTE — Telephone Encounter (Signed)
Pharmacy needs to clarify what dose of Lovaza patient should be taking.  Lovaza dose per Dr. Shirlee Latch on  [facility] med list--- Take 2 capsules (2 g total) by mouth 2 (two) times daily.  [facility] pharmacy informed.  Gaylene Brooks, RN

## 2012-04-19 NOTE — Telephone Encounter (Signed)
Message from our office voicemail--[facility] calling about problem with Lovaza Rx that was faxed to their office.  Returned call and left message to call our office back.  Gaylene Brooks, RN

## 2012-05-04 ENCOUNTER — Other Ambulatory Visit: Payer: Self-pay | Admitting: Cardiovascular Disease

## 2012-05-07 ENCOUNTER — Other Ambulatory Visit: Payer: Self-pay | Admitting: Cardiology

## 2012-05-30 ENCOUNTER — Other Ambulatory Visit: Payer: Self-pay | Admitting: Family Medicine

## 2012-05-30 DIAGNOSIS — Z1231 Encounter for screening mammogram for malignant neoplasm of breast: Secondary | ICD-10-CM

## 2012-06-07 ENCOUNTER — Encounter: Payer: Self-pay | Admitting: Family Medicine

## 2012-06-07 ENCOUNTER — Ambulatory Visit (HOSPITAL_COMMUNITY)
Admission: RE | Admit: 2012-06-07 | Discharge: 2012-06-07 | Disposition: A | Discharge status: Home / Self Care | Payer: No Typology Code available for payment source | Source: Ambulatory Visit | Attending: Family Medicine | Admitting: Family Medicine

## 2012-06-07 DIAGNOSIS — Z1231 Encounter for screening mammogram for malignant neoplasm of breast: Secondary | ICD-10-CM | POA: Insufficient documentation

## 2012-06-11 ENCOUNTER — Other Ambulatory Visit: Payer: Self-pay | Admitting: Cardiology

## 2012-06-14 ENCOUNTER — Telehealth: Payer: Self-pay | Admitting: Family Medicine

## 2012-06-14 DIAGNOSIS — E119 Type 2 diabetes mellitus without complications: Secondary | ICD-10-CM

## 2012-06-14 MED ORDER — METFORMIN HCL 1000 MG PO TABS: 1000.0000 mg | ORAL_TABLET | Freq: Two times a day (BID) | ORAL | Status: DC

## 2012-06-14 NOTE — Telephone Encounter (Signed)
Pt is having a hard time getting her metformin - pharmacy keeps telling her that we are denying it and she said she has talked with the nurse about this before.  Not sure what to do but she has 1 pill left. [facility] [address]

## 2012-06-14 NOTE — Telephone Encounter (Signed)
Pt called and informed that metformin would be refilled once but needs to make an appointment for this month. Pt stated that she will call and arrange one. Metformin e prescribed to [facility] on [address].  Ronelle Smallman, Harold Hedge, RN

## 2012-06-18 ENCOUNTER — Telehealth: Payer: Self-pay | Admitting: Cardiology

## 2012-06-18 ENCOUNTER — Encounter: Payer: Self-pay | Admitting: Family Medicine

## 2012-06-18 MED ORDER — CILOSTAZOL 100 MG PO TABS: 100.0000 mg | ORAL_TABLET | Freq: Two times a day (BID) | ORAL | Status: DC

## 2012-06-18 MED ORDER — CLOPIDOGREL BISULFATE 75 MG PO TABS: 75.0000 mg | ORAL_TABLET | Freq: Every day | ORAL | Status: DC

## 2012-06-18 NOTE — Telephone Encounter (Signed)
New Prob     Requesting to speak to nurse regarding medications (CLOPIDOGREL and CILOSTAZOL).

## 2012-06-18 NOTE — Telephone Encounter (Signed)
Neither of those medications should be very expensive as generics.  Can you see if we can find them somewhere else cheaper for her? Thanks.

## 2012-06-18 NOTE — Telephone Encounter (Signed)
This encounter was created in error - please disregard.

## 2012-06-18 NOTE — Telephone Encounter (Signed)
Error

## 2012-06-18 NOTE — Telephone Encounter (Signed)
Spoke with patient.

## 2012-06-18 NOTE — Telephone Encounter (Signed)
Pt states that she is unable to get her meds at [facility] or [facility] where she was able to get them at reasonable prices.  Marland KitchenMarland Kitchen

## 2012-06-18 NOTE — Telephone Encounter (Signed)
She is unable to use her medication card at all pharmacies. She has called around to several pharmacies that she is able to use and all their prices are high for these 2 meds. She states both clopidogrel and cilostazol are going to be very expensive for her. She is asking if both of these are absolutely necessary. I will forward to Dr Shirlee Latch for review and recommendations.

## 2012-06-20 NOTE — Telephone Encounter (Signed)
Pt was able to find medications at [facility]. Prescriptions sent to [facility].

## 2012-07-10 ENCOUNTER — Other Ambulatory Visit: Payer: Self-pay | Admitting: Family Medicine

## 2012-07-17 ENCOUNTER — Telehealth: Payer: Self-pay | Admitting: Cardiology

## 2012-07-17 DIAGNOSIS — I2510 Atherosclerotic heart disease of native coronary artery without angina pectoris: Secondary | ICD-10-CM

## 2012-07-17 DIAGNOSIS — E785 Hyperlipidemia, unspecified: Secondary | ICD-10-CM

## 2012-07-17 NOTE — Telephone Encounter (Signed)
Will forward to [facility] to review with Dr. Shirlee Latch. Last lipid profile in 01/2012. I was not sure if Dr. Shirlee Latch would like her lipids repeated prior to a refill on her Crestor.

## 2012-07-17 NOTE — Telephone Encounter (Signed)
New problem   Pt wants crestor prescription faxed-pt does not have the fax #-telephone # is [phone]

## 2012-07-18 ENCOUNTER — Encounter: Payer: Self-pay | Admitting: Family Medicine

## 2012-07-18 ENCOUNTER — Ambulatory Visit (INDEPENDENT_AMBULATORY_CARE_PROVIDER_SITE_OTHER): Payer: No Typology Code available for payment source | Admitting: Family Medicine

## 2012-07-18 VITALS — BP 123/75 | HR 96 | Ht 63.0 in | Wt 173.0 lb

## 2012-07-18 DIAGNOSIS — M79609 Pain in unspecified limb: Secondary | ICD-10-CM

## 2012-07-18 DIAGNOSIS — M79605 Pain in left leg: Secondary | ICD-10-CM

## 2012-07-18 DIAGNOSIS — E119 Type 2 diabetes mellitus without complications: Secondary | ICD-10-CM

## 2012-07-18 DIAGNOSIS — M79604 Pain in right leg: Secondary | ICD-10-CM

## 2012-07-18 LAB — POCT GLYCOSYLATED HEMOGLOBIN (HGB A1C): Hemoglobin A1C: 7.5

## 2012-07-18 MED ORDER — LISINOPRIL 10 MG PO TABS: 10.0000 mg | ORAL_TABLET | Freq: Every day | ORAL | Status: DC

## 2012-07-18 MED ORDER — HYDROCODONE-ACETAMINOPHEN 5-325 MG PO TABS: 1.0000 | ORAL_TABLET | Freq: Three times a day (TID) | ORAL | Status: DC | PRN

## 2012-07-18 MED ORDER — METFORMIN HCL 1000 MG PO TABS: 1000.0000 mg | ORAL_TABLET | Freq: Two times a day (BID) | ORAL | Status: DC

## 2012-07-18 NOTE — Telephone Encounter (Signed)
Follow up  ° ° ° ° °Pt is returning your call  °

## 2012-07-18 NOTE — Patient Instructions (Addendum)
Follow up in 1 month for the leg pain after your test.  For the diabetes, try and get back on your old diet and we'll recheck things in 3 months.   I strongly encourage that you get a PAP smear. Please make an appointment whenever is good for you

## 2012-07-18 NOTE — Telephone Encounter (Signed)
Spoke with pt. Pt states her application for pt assistance for crestor expires in August. She just received a 90 day supply of crestor. She is going to reapply for crestor pt assistance. The company is sending her a renewal application. Once  she has completed her part of the renewal application she will bring the forms for Dr Shirlee Latch to complete his portion. She will come for fasting lab 08/07/12 for lipid profile/liver profile since last fasting lipid was in Dec 2013. She will call tomorrow to schedule a follow up appt with Dr Shirlee Latch.

## 2012-07-18 NOTE — Telephone Encounter (Signed)
LMTCB

## 2012-07-20 DIAGNOSIS — M79604 Pain in right leg: Secondary | ICD-10-CM | POA: Insufficient documentation

## 2012-07-20 DIAGNOSIS — M79605 Pain in left leg: Secondary | ICD-10-CM | POA: Insufficient documentation

## 2012-07-20 NOTE — Progress Notes (Signed)
Patient ID: [name]    DOB: 02/25/1955, 57 y.o.   MRN: [id number] --- Subjective:  [name] is a 57 y.o.female who presents for follow up on leg pain and diabetes. - leg pain: bilateral, has been ongoing for at least one year. Pain continues to be squeezing of the calf in nature, constant, wakes her up at night. Pain is worst with walking. She has stopped exercising due to the pain. Pain is worst at night: 10/10. AT its best it's a 7/10. Worst with laying down. Right leg worst than left.  Thought to be from PVD with claudication. She has been seen by Dr. Jearld Pies and has an appointment for further evaluation at the end of the month.  She has been taking vicodin 5/325 tid/prn with some relief.   - DM2: has quit her diet in the last few weeks and has been eating more fried foods and carbs. She started a diet where she drinks honey and cinnamon in water. Has drank this in the last 2 days. Denies any hypoglycemia. Takes metformin 1000mg  bid. Reports some numbness in toes occasionally. No tingling.    ROS: see HPI Past Medical History: reviewed and updated medications and allergies. Social History: Tobacco: not currently.   Objective: Filed Vitals:   07/18/12 1435  BP: 123/75  Pulse: 96    Physical Examination:   General appearance - alert, well appearing, and in no distress Chest - clear to auscultation, no wheezes, rales or rhonchi, symmetric air entry Heart - normal rate, regular rhythm, normal S1, S2, no murmurs, rubs, clicks or gallops Extremities - dorsalis pedis pulse present bilaterally, decreased sensation on distal plantar aspect of foot bilaterally, calluces present 4/5 strength with knee flexion and extension bilaterally, 3/5 strength with hip flexion.  No tenderness to palpation along spine.

## 2012-07-20 NOTE — Assessment & Plan Note (Signed)
Likely from PVD with claudication, but I wonder if there could also be a component of pseudoclaudication with spinal stenosis.  Will wait for testing results to be done at end of month and see extent of blockage.  If PVD is not deemed significant enough to be causing pain, will start working up her back.  Refilled vicodin 5/325 90 tablets, 1 refill.

## 2012-07-20 NOTE — Assessment & Plan Note (Addendum)
A1C worst today at 7.5 compared to 6.9 previously. Patient admits to lapse in diet. Wil try dietary changes and recheck A1C in 3 months. If the same or worst, will start glipizide (although patient tells me that there was a diabetes medicine that she has tried in the past that she can't tolerate: patient to bring the name of the medicine at next appointment) Retinal scan done in clinic today

## 2012-07-23 ENCOUNTER — Other Ambulatory Visit: Payer: Self-pay | Admitting: *Deleted

## 2012-07-23 ENCOUNTER — Encounter: Payer: Self-pay | Admitting: Family Medicine

## 2012-07-23 MED ORDER — ALBUTEROL SULFATE HFA 108 (90 BASE) MCG/ACT IN AERS: 2.0000 | INHALATION_SPRAY | Freq: Two times a day (BID) | RESPIRATORY_TRACT | Status: DC | PRN

## 2012-07-23 NOTE — Telephone Encounter (Signed)
Requested Prescriptions   Pending Prescriptions Disp Refills  . albuterol (PROVENTIL HFA;VENTOLIN HFA) 108 (90 BASE) MCG/ACT inhaler 3 Inhaler 3    Sig: Inhale 2 puffs into the lungs 2 (two) times daily as needed. For shortness of breath   Wyatt Haste, RN-BSN

## 2012-07-27 ENCOUNTER — Encounter: Payer: Self-pay | Admitting: Family Medicine

## 2012-08-07 ENCOUNTER — Other Ambulatory Visit (INDEPENDENT_AMBULATORY_CARE_PROVIDER_SITE_OTHER): Payer: No Typology Code available for payment source

## 2012-08-07 ENCOUNTER — Encounter (INDEPENDENT_AMBULATORY_CARE_PROVIDER_SITE_OTHER): Payer: No Typology Code available for payment source

## 2012-08-07 ENCOUNTER — Ambulatory Visit (INDEPENDENT_AMBULATORY_CARE_PROVIDER_SITE_OTHER): Payer: No Typology Code available for payment source | Admitting: Cardiovascular Disease

## 2012-08-07 ENCOUNTER — Encounter: Payer: Self-pay | Admitting: Cardiovascular Disease

## 2012-08-07 VITALS — BP 118/70 | HR 88 | Ht 62.5 in | Wt 172.1 lb

## 2012-08-07 DIAGNOSIS — I7389 Other specified peripheral vascular diseases: Secondary | ICD-10-CM

## 2012-08-07 DIAGNOSIS — I2510 Atherosclerotic heart disease of native coronary artery without angina pectoris: Secondary | ICD-10-CM

## 2012-08-07 DIAGNOSIS — E785 Hyperlipidemia, unspecified: Secondary | ICD-10-CM

## 2012-08-07 DIAGNOSIS — I70219 Atherosclerosis of native arteries of extremities with intermittent claudication, unspecified extremity: Secondary | ICD-10-CM

## 2012-08-07 DIAGNOSIS — I739 Peripheral vascular disease, unspecified: Secondary | ICD-10-CM

## 2012-08-07 LAB — LIPID PANEL
Cholesterol: 109 mg/dL (ref 0–200)
HDL: 47.1 mg/dL (ref >39.00)
LDL Cholesterol: 36 mg/dL (ref 0–99)
VLDL: 26.4 mg/dL (ref 0.0–40.0)

## 2012-08-07 LAB — HEPATIC FUNCTION PANEL
ALT: 24 U/L (ref 0–35)
Albumin: 4 g/dL (ref 3.5–5.2)
Bilirubin, Direct: 0 mg/dL (ref 0.0–0.3)
Total Protein: 7.1 g/dL (ref 6.0–8.3)

## 2012-08-07 NOTE — Patient Instructions (Addendum)
Your physician wants you to follow-up in: 1 YEAR with Dr Arida.  You will receive a reminder letter in the mail two months in advance. If you don't receive a letter, please call our office to schedule the follow-up appointment.  Your physician recommends that you continue on your current medications as directed. Please refer to the Current Medication list given to you today.  

## 2012-08-07 NOTE — Progress Notes (Signed)
HPI  This is 57 yo WF who is here for a follow up visit regarding peripheral arterial disease. She has known history of peripheral arterial disease status post atherectomy of the right SFA in 2011 by Dr. Sanjuana Kava. She has other chronic medical conditions including CAD, DM, HTN, and hyperlipidemia.  Doppler showed > 50% stenosis in the right proximal SFA. ABIs showed fall from 0.95 to 0.65 on the right with exercise. She is a former smoker. She had significant discomfort in the right calf  with minimal walking less than 50 feet in spite of being on optimal medications. Thus, I decided to proceed with angiography in 12/2011 which showed moderate diffuse disease in R SFA (at most 50%). Three vessel run below the knee but with sluggish flow distally. No revascularization was performed. She continues to complain of right lower extremity discomfort both at rest and with walking. She had a previous right knee replacement.  Allergies  Allergen Reactions  . Glucotrol (Glipizide) Shortness Of Breath and Rash    Red rash, with breathing difficulty  . Influenza Vaccines Other (See Comments)    Significant arm soreness requiring 1 year of physical therapy  . Latex Rash  . Milk-Related Compounds Rash    Fever  . Tetanus Toxoid Hives, Swelling and Rash    Swelling to site of injection with rash and fever     Current Outpatient Prescriptions on File Prior to Visit  Medication Sig Dispense Refill  . albuterol (PROVENTIL HFA;VENTOLIN HFA) 108 (90 BASE) MCG/ACT inhaler Inhale 2 puffs into the lungs 2 (two) times daily as needed. For shortness of breath  3 Inhaler  3  . aspirin 81 MG tablet Take 81 mg by mouth daily.        . bisoprolol-hydrochlorothiazide (ZIAC) 2.5-6.25 MG per tablet TAKE ONE TABLET BY MOUTH EVERY DAY  30 tablet  5  . cilostazol (PLETAL) 100 MG tablet Take 1 tablet (100 mg total) by mouth 2 (two) times daily.  60 tablet  6  . clopidogrel (PLAVIX) 75 MG tablet Take 1 tablet (75 mg total)  by mouth daily.  30 tablet  6  . esomeprazole (NEXIUM) 20 MG packet Take 20 mg by mouth daily before breakfast.  90 each  4  . fish oil-omega-3 fatty acids 1000 MG capsule Take 2 capsules (2 g total) by mouth 2 (two) times daily.  360 capsule  3  . Fluticasone-Salmeterol (ADVAIR DISKUS) 250-50 MCG/DOSE AEPB Inhale 1 puff into the lungs every 12 (twelve) hours.  180 each  3  . HYDROcodone-acetaminophen (NORCO/VICODIN) 5-325 MG per tablet Take 1 tablet by mouth 3 (three) times daily as needed for pain.  90 tablet  1  . isosorbide mononitrate (IMDUR) 60 MG 24 hr tablet Take 60 mg by mouth daily.       Marland Kitchen lisinopril (PRINIVIL,ZESTRIL) 10 MG tablet Take 1 tablet (10 mg total) by mouth daily.  90 tablet  4  . metFORMIN (GLUCOPHAGE) 1000 MG tablet Take 1 tablet (1,000 mg total) by mouth 2 (two) times daily with a meal.  60 tablet  6  . Multiple Vitamin (MULTIVITAMIN) tablet Take 1 tablet by mouth daily.        . rosuvastatin (CRESTOR) 40 MG tablet Take 40 mg by mouth at bedtime.       . [DISCONTINUED] omega-3 acid ethyl esters (LOVAZA) 1 G capsule Take 2 capsules (2 g total) by mouth 2 (two) times daily.  120 capsule  11   No  current facility-administered medications on file prior to visit.     Past Medical History  Diagnosis Date  . CAD (coronary artery disease)     ETT-myoview (12/10) with apical anterior ischemia, EF 75%.  LHC (1/11) showed diffuse disease..   There was a large D3 with serial 70%, 80-90%, and 70% stenoses then severe diffuse distal vessel disease.   Small to moderate OM1 with severe diffuse disease, 50% mRCA, 95% mid small PLV, 60% PDA.  EF 60%.  Patient managed medically, no interventional options.   . Other and unspecified hyperlipidemia 401.9    250.00  . Tobacco use disorder     quit 11/10  . Osteoarthritis     severe right knee  R TKR  . Asthma with COPD   . PAD (peripheral artery disease)     Severe stenosis mid right SFA s/p atherectomy 03/25/09. Moderate disease  diffusely throughout the  iliacs, bilateral SFA, below knee vessels.  ABIs post-procedure were 0.85 on right and 0.99 on left.   . Pancreatitis 2003     Past Surgical History  Procedure Laterality Date  . Joint replacement  2011    right knee replacement   . Tubal ligation  1980     Family History  Problem Relation Age of Onset  . Emphysema Father      History   Social History  . Marital Status: Married    Spouse Name: N/A    Number of Children: N/A  . Years of Education: 10   Occupational History  . Not on file.   Social History Main Topics  . Smoking status: Former Smoker    Quit date: 12/13/2008  . Smokeless tobacco: Not on file  . Alcohol Use: No  . Drug Use: No  . Sexually Active: Not on file   Other Topics Concern  . Not on file   Social History Narrative   Denies any IV drug use or marijuana use.  Former smoker for 30 years, quit Nov. 2010. She is married with 2 children. Disabled from knee, not working.     PHYSICAL EXAM   BP 118/70  Pulse 88  Ht 5' 2.5" (1.588 m)  Wt 172 lb 1.9 oz (78.073 kg)  BMI 30.96 kg/m2 Constitutional: She is oriented to person, place, and time. She appears well-developed and well-nourished. No distress.  HENT: No nasal discharge.  Head: Normocephalic and atraumatic.  Eyes: Pupils are equal and round. Right eye exhibits no discharge. Left eye exhibits no discharge.  Neck: Normal range of motion. Neck supple. No JVD present. No thyromegaly present.  Cardiovascular: Normal rate, regular rhythm, normal heart sounds. Exam reveals no gallop and no friction rub. No murmur heard.  Pulmonary/Chest: Effort normal and breath sounds normal. No stridor. No respiratory distress. She has no wheezes. She has no rales. She exhibits no tenderness.  Abdominal: Soft. Bowel sounds are normal. She exhibits no distension. There is no tenderness. There is no rebound and no guarding.  Musculoskeletal: Normal range of motion. She exhibits no edema  and no tenderness.  Neurological: She is alert and oriented to person, place, and time. Coordination normal.  Skin: Skin is warm and dry. No rash noted. She is not diaphoretic. No erythema. No pallor.  Psychiatric: She has a normal mood and affect. Her behavior is normal. Judgment and thought content normal.  Vascular: Femoral pulses are normal. Distal pulses are diminished on the left side and not palpable on the right side.    ASSESSMENT AND  PLAN

## 2012-08-07 NOTE — Assessment & Plan Note (Signed)
Right lower extremity discomfort is clearly out of proportion to the findings on angiography as well as ABI. We repeated ABI today which was 0.88 on the right side and 0.96 on the left side. Duplex showed mild right SFA diffuse disease. I suspect that she has neuropathic component. There is a possibility that she might have small vessel disease as well with endovascular dysfunction. However, I do not think she would benefit from revascularization at this point. She is already on optimal medical therapy. I advised her to start a walking exercise program.  Followup with me on a yearly basis.

## 2012-09-07 ENCOUNTER — Encounter: Payer: Self-pay | Admitting: Family Medicine

## 2012-09-07 ENCOUNTER — Other Ambulatory Visit (HOSPITAL_COMMUNITY)
Admission: RE | Admit: 2012-09-07 | Discharge: 2012-09-07 | Disposition: A | Discharge status: Home / Self Care | Payer: No Typology Code available for payment source | Source: Ambulatory Visit | Attending: Family Medicine | Admitting: Family Medicine

## 2012-09-07 ENCOUNTER — Ambulatory Visit (INDEPENDENT_AMBULATORY_CARE_PROVIDER_SITE_OTHER): Payer: No Typology Code available for payment source | Admitting: Family Medicine

## 2012-09-07 VITALS — BP 152/77 | HR 102 | Ht 62.5 in | Wt 172.0 lb

## 2012-09-07 DIAGNOSIS — L909 Atrophic disorder of skin, unspecified: Secondary | ICD-10-CM

## 2012-09-07 DIAGNOSIS — L918 Other hypertrophic disorders of the skin: Secondary | ICD-10-CM

## 2012-09-07 DIAGNOSIS — I7389 Other specified peripheral vascular diseases: Secondary | ICD-10-CM

## 2012-09-07 DIAGNOSIS — Z01419 Encounter for gynecological examination (general) (routine) without abnormal findings: Secondary | ICD-10-CM

## 2012-09-07 DIAGNOSIS — Z1151 Encounter for screening for human papillomavirus (HPV): Secondary | ICD-10-CM | POA: Insufficient documentation

## 2012-09-07 DIAGNOSIS — Z124 Encounter for screening for malignant neoplasm of cervix: Secondary | ICD-10-CM

## 2012-09-07 DIAGNOSIS — E119 Type 2 diabetes mellitus without complications: Secondary | ICD-10-CM

## 2012-09-07 MED ORDER — HYDROCODONE-ACETAMINOPHEN 5-325 MG PO TABS: 1.0000 | ORAL_TABLET | Freq: Three times a day (TID) | ORAL | Status: DC | PRN

## 2012-09-07 NOTE — Patient Instructions (Addendum)
I will send you a letter with the results of the PAP or call you if there is anything to discuss.   Let me know if you would like to start gabapentin.   See you back in a couple months or sooner if needed.

## 2012-09-08 NOTE — Progress Notes (Signed)
Patient ID: [name]    DOB: 1955-03-08, 57 y.o.   MRN: [id number] --- Subjective:  [name] is a 57 y.o.female who presents for well woman exam as well as medication refill for PVD induced leg pain.   - leg pain: seen by Dr. Kirke Corin on 08/07/12 where Duplex showed mild right SFA diffuse disease. Suspicion of neuropathic component to pain. No revascularization recommended at this time. PAtient continues to have the same amount of pain, worst with walking less 100 feet. Pain is located mostly in right leg, sharp shooting pain. Worst at night. She has been taking vicodin 5/325 tid/prn which helps control the pain. She is also on pletal. She has also been trying to walk. She denies any back pain or any weakness in the lower extremities.   - well woman:  No vaginal bleeding, no abnormal discharge Has not had a PAP in 20 years due to anxiety from exam. Prior to that, no abnormal PAP.  LMP: 12 years ago.  Gyn Surgeries: BTL 33 years ago 2 children vaginal births.  No family history of gyn cancers.  Last mammogram: 06/07/12 normal Occasionally sexually active with her husband. No concern for STI's.   ROS: see HPI Past Medical History: reviewed and updated medications and allergies. Social History: Tobacco: former smoker  Objective: Filed Vitals:   09/07/12 0933  BP: 152/77  Pulse: 102    Physical Examination:   General appearance - alert, well appearing, and in no distress Chest - clear to auscultation, no wheezes, rales or rhonchi, symmetric air entry Heart - normal rate, regular rhythm, normal S1, S2, no murmurs, rubs, clicks or gallops Breast - no masses, no skin changes Pelvic exam: normal external genitalia, vulva, vagina, cervix, uterus and adnexa. Skin - multiple skin tags on bilateral axilla

## 2012-09-09 DIAGNOSIS — L918 Other hypertrophic disorders of the skin: Secondary | ICD-10-CM | POA: Insufficient documentation

## 2012-09-09 DIAGNOSIS — Z01419 Encounter for gynecological examination (general) (routine) without abnormal findings: Secondary | ICD-10-CM | POA: Insufficient documentation

## 2012-09-09 NOTE — Assessment & Plan Note (Signed)
Patient report that they bleed when shaving.  Told patient she can make appointment with me specifically for removal.

## 2012-09-09 NOTE — Assessment & Plan Note (Signed)
Continue pain management: refilled vicodin 5/325 tid/prn 90 tablets 1 refill. Continue cilostazol and exercise.  Discussed possibility of starting gabapentin in case there is a neuropathic component. Patient is hesitant to start due to cost and adding another medicine as well as due to concern of side effects. I discussed that main side effect is drowsiness and that we start at lower dose in order to avoid excessive sedation. Patient to call office if she decides to start medicine.

## 2012-09-09 NOTE — Assessment & Plan Note (Signed)
Follow up PAP smear result. PAP with HPV cotest. Mammogram up to date.

## 2012-09-11 ENCOUNTER — Telehealth: Payer: Self-pay | Admitting: Family Medicine

## 2012-09-11 NOTE — Telephone Encounter (Signed)
Gabapentin would start at 300mg  at night time with the possibility of increasing it to 300mg  three times a day.  Marena Chancy, PGY-3 Family Medicine Resident

## 2012-09-11 NOTE — Telephone Encounter (Signed)
Patient is calling about a medication that Dr. Gwenlyn Saran wants her to try.  She needs to know what the dose will be to find out what the cost will be so she can find out if it is affordable for her.

## 2012-09-11 NOTE — Telephone Encounter (Signed)
message given to patient.  .memd

## 2012-09-11 NOTE — Telephone Encounter (Signed)
Pt need to know exact dosage she will be taking of the gabapentin.  Will fwd to MD for advice.  Daneya Hartgrove, Darlyne Russian, CMA

## 2012-09-13 ENCOUNTER — Telehealth: Payer: Self-pay | Admitting: Family Medicine

## 2012-09-13 MED ORDER — GABAPENTIN 300 MG PO CAPS: 300.0000 mg | ORAL_CAPSULE | Freq: Every day | ORAL | Status: DC

## 2012-09-13 NOTE — Telephone Encounter (Signed)
Pt is calling to request the medication that her and Dr. Gwenlyn Saran discussed at her last visit. She has decided that she does want it and to have it sent to the [facility] pharmacy on [address] JW

## 2012-09-13 NOTE — Telephone Encounter (Signed)
Sent Rx for gabapentin 300mg  qhs 90 tablets to [facility]  Marena Chancy, PGY-3 Family Medicine Resident

## 2012-09-13 NOTE — Telephone Encounter (Signed)
Will fwd to Md.  Elizabeth Paulsen L, CMA  

## 2012-09-18 ENCOUNTER — Telehealth: Payer: Self-pay | Admitting: Family Medicine

## 2012-09-18 NOTE — Telephone Encounter (Signed)
Will fwd to MD.  Keli Buehner L, CMA  

## 2012-09-18 NOTE — Telephone Encounter (Signed)
Can you please call back [name] and let her know that if she doesn't tolerate the medicine, she can stop taking it and we can talk more about it at her next visit.  Thank you so much!  Marena Chancy, PGY-3 Family Medicine Resident

## 2012-09-18 NOTE — Telephone Encounter (Signed)
Pt notified.  Marianita Botkin L, CMA  

## 2012-09-18 NOTE — Telephone Encounter (Signed)
Pt is calling to let Dr. Gwenlyn Saran know that the gabapentin she is on make her feel different and she doesn't like the way she feels. She wants to know should she still take it or come back in to see Dr. Gwenlyn Saran . JW

## 2012-09-21 ENCOUNTER — Telehealth: Payer: Self-pay | Admitting: Family Medicine

## 2012-09-21 NOTE — Telephone Encounter (Signed)
Called patient to let her know that PAP smear results were normal. Repeat in 3-5 years.  Patient expressed understanding.   Marena Chancy, PGY-3 Family Medicine Resident

## 2012-10-02 ENCOUNTER — Ambulatory Visit: Payer: No Typology Code available for payment source | Admitting: Cardiology

## 2012-10-30 ENCOUNTER — Ambulatory Visit: Payer: No Typology Code available for payment source

## 2012-11-06 ENCOUNTER — Ambulatory Visit: Payer: Self-pay | Admitting: Cardiology

## 2012-11-12 ENCOUNTER — Ambulatory Visit (INDEPENDENT_AMBULATORY_CARE_PROVIDER_SITE_OTHER): Payer: No Typology Code available for payment source | Admitting: Family Medicine

## 2012-11-12 ENCOUNTER — Encounter: Payer: Self-pay | Admitting: Family Medicine

## 2012-11-12 VITALS — BP 127/79 | HR 93 | Ht 62.5 in | Wt 168.4 lb

## 2012-11-12 DIAGNOSIS — M79605 Pain in left leg: Secondary | ICD-10-CM

## 2012-11-12 DIAGNOSIS — E119 Type 2 diabetes mellitus without complications: Secondary | ICD-10-CM

## 2012-11-12 DIAGNOSIS — M79609 Pain in unspecified limb: Secondary | ICD-10-CM

## 2012-11-12 DIAGNOSIS — M79604 Pain in right leg: Secondary | ICD-10-CM

## 2012-11-12 LAB — POCT GLYCOSYLATED HEMOGLOBIN (HGB A1C): Hemoglobin A1C: 6.8

## 2012-11-12 MED ORDER — HYDROCODONE-ACETAMINOPHEN 5-325 MG PO TABS: 1.0000 | ORAL_TABLET | Freq: Three times a day (TID) | ORAL | Status: DC | PRN

## 2012-11-12 NOTE — Patient Instructions (Addendum)
I am sorry your morning did not go as planned.   Your A1C was perfect today! Congratulations!!  Please come back to see Dr. Gwenlyn Saran to discuss your pain medication. Keep moving your knees and elevate your legs for the swelling.  Briany Aye M. Avy Barlett, M.D.  Knee Exercises EXERCISES RANGE OF MOTION(ROM) AND STRETCHING EXERCISES These exercises may help you when beginning to rehabilitate your injury. Your symptoms may resolve with or without further involvement from your physician, physical therapist or athletic trainer. While completing these exercises, remember:   Restoring tissue flexibility helps normal motion to return to the joints. This allows healthier, less painful movement and activity.  An effective stretch should be held for at least 30 seconds.  A stretch should never be painful. You should only feel a gentle lengthening or release in the stretched tissue. STRETCH - Knee Extension, Prone  Lie on your stomach on a firm surface, such as a bed or countertop. Place your right / left knee and leg just beyond the edge of the surface. You may wish to place a towel under the far end of your right / left thigh for comfort.  Relax your leg muscles and allow gravity to straighten your knee. Your clinician may advise you to add an ankle weight if more resistance is helpful for you.  You should feel a stretch in the back of your right / left knee. Hold this position for __________ seconds. Repeat __________ times. Complete this stretch __________ times per day. * Your physician, physical therapist or athletic trainer may ask you to add ankle weight to enhance your stretch.  RANGE OF MOTION - Knee Flexion, Active  Lie on your back with both knees straight. (If this causes back discomfort, bend your opposite knee, placing your foot flat on the floor.)  Slowly slide your heel back toward your buttocks until you feel a gentle stretch in the front of your knee or thigh.  Hold for __________  seconds. Slowly slide your heel back to the starting position. Repeat __________ times. Complete this exercise __________ times per day.  STRETCH - Quadriceps, Prone   Lie on your stomach on a firm surface, such as a bed or padded floor.  Bend your right / left knee and grasp your ankle. If you are unable to reach, your ankle or pant leg, use a belt around your foot to lengthen your reach.  Gently pull your heel toward your buttocks. Your knee should not slide out to the side. You should feel a stretch in the front of your thigh and/or knee.  Hold this position for __________ seconds. Repeat __________ times. Complete this stretch __________ times per day.  STRETCH  Hamstrings, Supine   Lie on your back. Loop a belt or towel over the ball of your right / left foot.  Straighten your right / left knee and slowly pull on the belt to raise your leg. Do not allow the right / left knee to bend. Keep your opposite leg flat on the floor.  Raise the leg until you feel a gentle stretch behind your right / left knee or thigh. Hold this position for __________ seconds. Repeat __________ times. Complete this stretch __________ times per day.  STRENGTHENING EXERCISES These exercises may help you when beginning to rehabilitate your injury. They may resolve your symptoms with or without further involvement from your physician, physical therapist or athletic trainer. While completing these exercises, remember:   Muscles can gain both the endurance and the strength  needed for everyday activities through controlled exercises.  Complete these exercises as instructed by your physician, physical therapist or athletic trainer. Progress the resistance and repetitions only as guided.  You may experience muscle soreness or fatigue, but the pain or discomfort you are trying to eliminate should never worsen during these exercises. If this pain does worsen, stop and make certain you are following the directions  exactly. If the pain is still present after adjustments, discontinue the exercise until you can discuss the trouble with your clinician. STRENGTH - Quadriceps, Isometrics  Lie on your back with your right / left leg extended and your opposite knee bent.  Gradually tense the muscles in the front of your right / left thigh. You should see either your knee cap slide up toward your hip or increased dimpling just above the knee. This motion will push the back of the knee down toward the floor/mat/bed on which you are lying.  Hold the muscle as tight as you can without increasing your pain for __________ seconds.  Relax the muscles slowly and completely in between each repetition. Repeat __________ times. Complete this exercise __________ times per day.  STRENGTH - Quadriceps, Short Arcs   Lie on your back. Place a __________ inch towel roll under your knee so that the knee slightly bends.  Raise only your lower leg by tightening the muscles in the front of your thigh. Do not allow your thigh to rise.  Hold this position for __________ seconds. Repeat __________ times. Complete this exercise __________ times per day.  OPTIONAL ANKLE WEIGHTS: Begin with ____________________, but DO NOT exceed ____________________. Increase in 1 pound/0.5 kilogram increments.  STRENGTH - Quadriceps, Straight Leg Raises  Quality counts! Watch for signs that the quadriceps muscle is working to insure you are strengthening the correct muscles and not "cheating" by substituting with healthier muscles.  Lay on your back with your right / left leg extended and your opposite knee bent.  Tense the muscles in the front of your right / left thigh. You should see either your knee cap slide up or increased dimpling just above the knee. Your thigh may even quiver.  Tighten these muscles even more and raise your leg 4 to 6 inches off the floor. Hold for __________ seconds.  Keeping these muscles tense, lower your  leg.  Relax the muscles slowly and completely in between each repetition. Repeat __________ times. Complete this exercise __________ times per day.  STRENGTH - Hamstring, Curls  Lay on your stomach with your legs extended. (If you lay on a bed, your feet may hang over the edge.)  Tighten the muscles in the back of your thigh to bend your right / left knee up to 90 degrees. Keep your hips flat on the bed/floor.  Hold this position for __________ seconds.  Slowly lower your leg back to the starting position. Repeat __________ times. Complete this exercise __________ times per day.  OPTIONAL ANKLE WEIGHTS: Begin with ____________________, but DO NOT exceed ____________________. Increase in 1 pound/0.5 kilogram increments.  STRENGTH  Quadriceps, Squats  Stand in a door frame so that your feet and knees are in line with the frame.  Use your hands for balance, not support, on the frame.  Slowly lower your weight, bending at the hips and knees. Keep your lower legs upright so that they are parallel with the door frame. Squat only within the range that does not increase your knee pain. Never let your hips drop below your knees.  Slowly return upright, pushing with your legs, not pulling with your hands. Repeat __________ times. Complete this exercise __________ times per day.  STRENGTH - Quadriceps, Wall Slides  Follow guidelines for form closely. Increased knee pain often results from poorly placed feet or knees.  Lean against a smooth wall or door and walk your feet out 18-24 inches. Place your feet hip-width apart.  Slowly slide down the wall or door until your knees bend __________ degrees.* Keep your knees over your heels, not your toes, and in line with your hips, not falling to either side.  Hold for __________ seconds. Stand up to rest for __________ seconds in between each repetition. Repeat __________ times. Complete this exercise __________ times per day. * Your physician,  physical therapist or athletic trainer will alter this angle based on your symptoms and progress. Document Released: 12/15/2004 Document Revised: 04/25/2011 Document Reviewed: 05/15/2008 [facility] Patient Information 2014 [place], [place].

## 2012-11-12 NOTE — Assessment & Plan Note (Signed)
A1C at goal. Con't Metformin. Will need BMet at next visit for creatinine check. F/u in 3 months for DM.

## 2012-11-12 NOTE — Progress Notes (Signed)
Patient ID: [name], female   DOB: [birth year], 57 y.o.   MRN: [id number]  [facility] Leslie Bakula M. Female Minish, MD Phone: [phone]   Subjective: HPI: Patient is a 57 y.o. female presenting to clinic today for follow up appointment.  1. Diabetes:  High at home: Checks once daily 150. Low at home: 128, no symptomatic lows.  Taking medications: Metformin 1000mg  BID Side effects: None ROS: denies fever, chills, dizziness, LOC, polyuria, polydipsia, numbness or tingling in extremities or chest pain. Last eye exam: 2014 retinal exam at [facility] Last foot exam: 2014 Nephropathy screen indicated?: On lisinopril, last Creat was 1.2 in Oct 2013 Last flu, zoster and/or pneumovax: "Allergic to flu shot", had pneumovax 3 years ago  2. Leg pain: She states she is on pain pills for her right knee and having nerve damage. She is now having issues with the left knee for the last several days. She states the cold weather and rain recently has made it worse. She has been on Advil, ibuprofen, heat/ice and is walking "all the time."  History Reviewed: Non smoker.  ROS: Please see HPI above.  Objective: Office vital signs reviewed. BP 127/79  Pulse 93  Ht 5' 2.5" (1.588 m)  Wt 168 lb 6.4 oz (76.386 kg)  BMI 30.29 kg/m2  Physical Examination:  General: Awake, alert. NAD.  HEENT: Atraumatic, normocephalic. MMM Pulm: CTAB, no wheezes Cardio: RRR, no murmurs appreciated Abdomen:+BS, soft, nontender, nondistended Extremities: No edema appreciated. Well healed scar on right knee. Left knee with mild edema, +crepitus. FROM. TTP on medial joint line Neuro: Grossly intact, walks with slight limp  Assessment: 57 y.o. female follow up.  Plan: See Problem List and After Visit Summary

## 2012-11-12 NOTE — Assessment & Plan Note (Signed)
S/p joint replacement on right, now with left knee pain. Unable to tolerate Neurontin. Requesting refill on Norco. Patient is aware of the DEA changes, and I have advised her that the [facility] medicine center is also undergoing changes with their controlled medications. Given one month printed Rx for #90, and follow up with Dr. Gwenlyn Saran prior to next refill to discuss medication, pain contract, etc. Patient agrees with plan. Continue heat/ice and knee exercises which were provided today.

## 2012-11-28 ENCOUNTER — Ambulatory Visit: Payer: Self-pay | Admitting: Cardiology

## 2012-12-11 ENCOUNTER — Other Ambulatory Visit: Payer: Self-pay

## 2012-12-11 MED ORDER — BISOPROLOL-HYDROCHLOROTHIAZIDE 2.5-6.25 MG PO TABS: ORAL_TABLET | ORAL | Status: DC

## 2012-12-13 ENCOUNTER — Encounter: Payer: Self-pay | Admitting: *Deleted

## 2012-12-13 ENCOUNTER — Telehealth: Payer: Self-pay | Admitting: *Deleted

## 2012-12-13 ENCOUNTER — Telehealth: Payer: Self-pay | Admitting: Cardiology

## 2012-12-13 ENCOUNTER — Other Ambulatory Visit: Payer: Self-pay | Admitting: *Deleted

## 2012-12-13 MED ORDER — ISOSORBIDE MONONITRATE ER 60 MG PO TB24: 90.0000 mg | ORAL_TABLET | Freq: Every day | ORAL | Status: DC

## 2012-12-13 NOTE — Telephone Encounter (Signed)
Patient states she is taking 3 tablets of  30 mg tablets to equal 90 mg of the isosorbide mono

## 2012-12-13 NOTE — Telephone Encounter (Signed)
Message copied by Carmela Hurt on Thu Dec 13, 2012 10:07 AM ------      Message from: Jacqlyn Krauss      Created: Tue Dec 11, 2012  5:20 PM      Regarding: RE: question       Dr Shirlee Latch had increased her Imdur to 90mg  daily June 2013 (I had prescribed 1 and 1/2 of a 60mg ), so I think it should be 90mg  daily. I tried to call her but did not get an answer.       ----- Message -----         From: Carmela Hurt, RN         Sent: 12/11/2012   5:08 PM           To: Jacqlyn Krauss, RN, Carmela Hurt, RN      Subject: question                                                 I'm as lost as last years Easter eggs, received refill for imdur 30mg  take 3 tablets daily, is the dose suppose to be 90 mg or 60 mg per day, thanks, Kim       ------

## 2012-12-13 NOTE — Telephone Encounter (Signed)
Left patient message that imdur dose should be total of 90 mg daily, I also sent in script with correct dosing. I left my number if she has any questions.

## 2012-12-13 NOTE — Telephone Encounter (Signed)
This encounter was created in error - please disregard.

## 2012-12-13 NOTE — Telephone Encounter (Signed)
Pt called back for Selena Batten

## 2012-12-14 ENCOUNTER — Ambulatory Visit (INDEPENDENT_AMBULATORY_CARE_PROVIDER_SITE_OTHER): Payer: No Typology Code available for payment source | Admitting: Family Medicine

## 2012-12-14 VITALS — BP 142/83 | HR 99 | Temp 98.7°F | Ht 63.0 in | Wt 170.0 lb

## 2012-12-14 DIAGNOSIS — I7389 Other specified peripheral vascular diseases: Secondary | ICD-10-CM

## 2012-12-14 DIAGNOSIS — E119 Type 2 diabetes mellitus without complications: Secondary | ICD-10-CM

## 2012-12-14 DIAGNOSIS — M1710 Unilateral primary osteoarthritis, unspecified knee: Secondary | ICD-10-CM

## 2012-12-14 MED ORDER — HYDROCODONE-ACETAMINOPHEN 5-325 MG PO TABS: 1.0000 | ORAL_TABLET | Freq: Three times a day (TID) | ORAL | Status: DC | PRN

## 2012-12-14 NOTE — Patient Instructions (Signed)
For the knee pain, we'll try the aspercreme to the knee that you can get over the counter.  We'll get an xray of the knee and then make an appointment for an injection, after we've called you to tell you it's ok.   Come for labs at any time. Make an appointment 1-2 days before for a lab appointment.

## 2012-12-15 NOTE — Assessment & Plan Note (Signed)
Refilled vicodin.  Encouraged walking

## 2012-12-15 NOTE — Assessment & Plan Note (Signed)
Will obtain xrays to evaluate location and amount of osteoarthritis.  Once xray results obtained, will consider steroid injection which patient would be interested in.  In the meantime, will treat with compression with sleeve that patient had from right knee surgery.  aspercreme (instead of voltaren since patient doesn't have insurance coverage) Limit NSAIDs as patient has Cr of 1.2.  vicodin for claudication already on board.

## 2012-12-15 NOTE — Progress Notes (Signed)
Patient ID: [name]    DOB: October 07, 1955, 57 y.o.   MRN: [id number] --- Subjective:  [name] is a 57 y.o.female who presents for medication refill and for follow up on leg pain.  - leg pain: still present in right calf, associated with claudication from PVD. Has been walking her 1/4 mile driveway ever day. Stops 6 times during that walk, due to pain. Pain is throbbing, constant, better with rest, but still present. She was seen by vascular who did not recommend revascularization or stenting. She has been taking vicodin 5/325 tid.   - left knee pain: located in front of the knee. Started 6 months ago. She had replacement of her right knee and she describes the left knee as being similar in pain. Pain is worst with walking. It does sometimes lockup or give way. Took a goodie powder which helped. She also tried icy hot and bengay which irritated her skin. No injury or change in activity that prompted this.   - migraine and epistaxis: h/o migraines since age of 57yo. Has been having mor frequent migraines, similar in nature, pounding headache. Worst with light and sound. No nausea, no vomiting. No weakness or change in vision.  She has also been having epistaxis 4 times per week, lating less than 1 minute and resolving spontaneously.    ROS: see HPI Past Medical History: reviewed and updated medications and allergies. Social History: Tobacco: none  Objective: Filed Vitals:   12/14/12 1039  BP: 142/83  Pulse: 99  Temp: 98.7 F (37.1 C)    Physical Examination:   General appearance - alert, well appearing, and in no distress Neuro - CN2-12 grossly intact Left knee: tender to palpation along lateral and medial aspects of the patella, range of motion limited from 5-100. Negative mcMurrays, negative seated anterior drawer or valgus and varus stress. Crepitus with flexion appreciated.  Extremities: no edema, DP pulse present on left but not right

## 2012-12-19 ENCOUNTER — Ambulatory Visit
Admission: RE | Admit: 2012-12-19 | Discharge: 2012-12-19 | Disposition: A | Discharge status: Home / Self Care | Payer: No Typology Code available for payment source | Source: Ambulatory Visit | Attending: Family Medicine | Admitting: Family Medicine

## 2012-12-19 ENCOUNTER — Other Ambulatory Visit: Payer: Self-pay | Admitting: Family Medicine

## 2012-12-19 ENCOUNTER — Other Ambulatory Visit: Payer: No Typology Code available for payment source

## 2012-12-19 DIAGNOSIS — E119 Type 2 diabetes mellitus without complications: Secondary | ICD-10-CM

## 2012-12-19 DIAGNOSIS — R040 Epistaxis: Secondary | ICD-10-CM

## 2012-12-19 LAB — BASIC METABOLIC PANEL
BUN: 29 mg/dL — ABNORMAL HIGH (ref 6–23)
CO2: 22 mEq/L (ref 19–32)
Calcium: 9.9 mg/dL (ref 8.4–10.5)
Creat: 1.11 mg/dL — ABNORMAL HIGH (ref 0.50–1.10)
Glucose, Bld: 103 mg/dL — ABNORMAL HIGH (ref 70–99)
Potassium: 4.7 mEq/L (ref 3.5–5.3)

## 2012-12-19 LAB — CBC
MCV: 87.4 fL (ref 78.0–100.0)
Platelets: 305 10*3/uL (ref 150–400)
RBC: 4.53 MIL/uL (ref 3.87–5.11)
RDW: 13.7 % (ref 11.5–15.5)
WBC: 7.4 10*3/uL (ref 4.0–10.5)

## 2012-12-19 NOTE — Progress Notes (Signed)
BMP & CBC drawn per Dr. Gwenlyn Saran verbal order.  Dewitt Hoes, MLS

## 2012-12-28 ENCOUNTER — Telehealth: Payer: Self-pay | Admitting: Family Medicine

## 2012-12-28 NOTE — Telephone Encounter (Signed)
Called patient to inform her about lab results and xray results. Told her about arthritis in knee and that she might benefit from steroid injection. Also told her of normal platelets.  She continues to have migraine headaches. Told her we could talk about that at next visit and maybe start her on paxil which she has been on in the past. This would also help with her hot flashes.   Marena Chancy, PGY-3 Family Medicine Resident

## 2012-12-28 NOTE — Telephone Encounter (Signed)
Pt called and would like someone to call her about her labs and xray's jw

## 2012-12-28 NOTE — Telephone Encounter (Signed)
Will fwd to MD for results.  Klinton Candelas, Darlyne Russian, CMA

## 2013-01-07 ENCOUNTER — Ambulatory Visit: Payer: No Typology Code available for payment source

## 2013-01-07 ENCOUNTER — Ambulatory Visit (INDEPENDENT_AMBULATORY_CARE_PROVIDER_SITE_OTHER): Payer: No Typology Code available for payment source | Admitting: Cardiology

## 2013-01-07 ENCOUNTER — Encounter: Payer: Self-pay | Admitting: Cardiology

## 2013-01-07 VITALS — BP 131/78 | HR 93 | Ht 62.5 in | Wt 170.8 lb

## 2013-01-07 DIAGNOSIS — E785 Hyperlipidemia, unspecified: Secondary | ICD-10-CM

## 2013-01-07 DIAGNOSIS — I2510 Atherosclerotic heart disease of native coronary artery without angina pectoris: Secondary | ICD-10-CM

## 2013-01-07 DIAGNOSIS — I7389 Other specified peripheral vascular diseases: Secondary | ICD-10-CM

## 2013-01-07 LAB — LIPID PANEL
Cholesterol: 110 mg/dL (ref 0–200)
HDL: 46.9 mg/dL (ref >39.00)
Triglycerides: 139 mg/dL (ref 0.0–149.0)

## 2013-01-07 NOTE — Progress Notes (Signed)
Patient ID: [name], female   DOB: 12/07/1955, 57 y.o.   MRN: [id number] PCP: Dr. Gwenlyn Saran   57 yo WF with h/o PVD, CAD,  DM, HTN, and hyperlipidemia is here for followup. Steffanie Dunn was done in 6/13 and showed no ischemia or infarction.  In 10/13, she had lower extremity arterial dopplers done.  This showed > 50% stenosis in the right proximal SFA.  ABIs showed fall from 0.95 to 0.65 on the right with exercise. She saw Dr. Kirke Corin and had peripheral angiography in 11/13 with only about 50% diffuse right SFA stenosis.  Her leg pain was out of proportion to the degree of PAD.  She has been on cilostazol without much effect. Just walking in the house gives her right>left calf pain at times. She still tries to walk 20-30 minutes daily outside but has to stop around 4 times during the walk for calf pain.  No foot ulcers.  There was a thought that maybe this could be neuropathic pain related to her diabetes.  She tried gabapentin but had a lot of side effects and had to stop it.    No chest pain.  She also does not get exertional dyspnea.  She really is limited primarily by her leg pain.   ECG: NSR, normal  Labs (10/11): LFTs normal Labs (4/12): LDL 73, HDL 54, triglycerides 340 Labs (9/12): TGs 335, HDL 47, LDL 55 Labs (6/13): HDL 55, LDL 55, TGs 274 Labs (10/13): K 4.5, creatinine 1.24 Labs (6/14): LDL 36, HDL 47 Labs (11/14): K 4.7, creatinine 1.11  Allergies (verified):  1)  ! * Tetanus 2)  ! * Latex 3)  ! * Milk  Past History:  Past Medical History: 1. CAD: ETT-myoview (12/10) with apical anterior ischemia, EF 75%.  LHC (1/11) showed diffuse disease.  There was a large D3 with serial 70%, 80-90%, and 70% stenoses then severe diffuse distal vessel disease.  Small to moderate OM1 with severe diffuse disease, 50% mRCA, 95% mid small PLV, 60% PDA.  EF 60%.  Patient managed medically, no interventional options.  Lexiscan myoview (6/13) with EF 74%, no ischemia or infarction.  2. DYSLIPIDEMIA  (ICD-272.4) 3. HYPERTENSION (ICD-401.9) 4. DIABETES MELLITUS, TYPE II (ICD-250.00) 5. TOBACCO ABUSE (ICD-305.1): Quit 11/10.  6. Severe osteoarthritis right knee, now s/p R TKR.  7. Asthma/COPD 8. PAD: Severe stenosis mid right SFA s/p atherectomy 03/25/09. Moderate disease diffusely throughout the iliacs, bilateral SFA, below knee vessels.  ABIs post-procedure were 0.85 on right and 0.99 on left.  Repeat ABIs (8/11): 0.88 right, 1.2 left.  ABIs (3/12): 0.85 right, 0.99 left.  ABIs (4/13): 0.81 right, 0.98 left. Peripheral arterial dopplers (10/13) with > 50% proximal right SFA stenosis.  ABIs on right worsened from 0.95 => 0.65 with exercise.  Peripheral angiography (11/13): 50% diffuse right SFA stenosis. ABIs (6/14): 0.88 right, 0.96 left.    Family History: Mother alive with CVA Father deceased with emphysema  Social History: No etoh-used to drink. Denies any IV drug use or marijuana use.   Former smoker for 30 years, quit November 2010. She is married with two children. Disabled from knee, not working.     Current Outpatient Prescriptions  Medication Sig Dispense Refill  . albuterol (PROVENTIL HFA;VENTOLIN HFA) 108 (90 BASE) MCG/ACT inhaler Inhale 2 puffs into the lungs 2 (two) times daily as needed. For shortness of breath  3 Inhaler  3  . aspirin 81 MG tablet Take 81 mg by mouth daily.        Marland Kitchen  bisoprolol-hydrochlorothiazide (ZIAC) 2.5-6.25 MG per tablet TAKE ONE TABLET BY MOUTH EVERY DAY  30 tablet  5  . cilostazol (PLETAL) 100 MG tablet Take 1 tablet (100 mg total) by mouth 2 (two) times daily.  60 tablet  6  . clopidogrel (PLAVIX) 75 MG tablet Take 1 tablet (75 mg total) by mouth daily.  30 tablet  6  . esomeprazole (NEXIUM) 20 MG packet Take 20 mg by mouth daily before breakfast.  90 each  4  . fish oil-omega-3 fatty acids 1000 MG capsule Take 2 capsules (2 g total) by mouth 2 (two) times daily.  360 capsule  3  . Fluticasone-Salmeterol (ADVAIR DISKUS) 250-50 MCG/DOSE AEPB  Inhale 1 puff into the lungs every 12 (twelve) hours.  180 each  3  . HYDROcodone-acetaminophen (NORCO/VICODIN) 5-325 MG per tablet Take 1 tablet by mouth 3 (three) times daily as needed for pain.  90 tablet  0  . isosorbide mononitrate (IMDUR) 60 MG 24 hr tablet Take 1.5 tablets (90 mg total) by mouth daily.  45 tablet  6  . lisinopril (PRINIVIL,ZESTRIL) 10 MG tablet Take 1 tablet (10 mg total) by mouth daily.  90 tablet  4  . metFORMIN (GLUCOPHAGE) 1000 MG tablet Take 1 tablet (1,000 mg total) by mouth 2 (two) times daily with a meal.  60 tablet  6  . Multiple Vitamin (MULTIVITAMIN) tablet Take 1 tablet by mouth daily.        . rosuvastatin (CRESTOR) 40 MG tablet Take 40 mg by mouth at bedtime.       . [DISCONTINUED] omega-3 acid ethyl esters (LOVAZA) 1 G capsule Take 2 capsules (2 g total) by mouth 2 (two) times daily.  120 capsule  11   No current facility-administered medications for this visit.    BP 131/78  Pulse 93  Ht 5' 2.5" (1.588 m)  Wt 77.474 kg (170 lb 12.8 oz)  BMI 30.72 kg/m2 General: NAD Neck: No JVD, no thyromegaly or thyroid nodule.  Lungs: Clear to auscultation bilaterally with normal respiratory effort. CV: Nondisplaced PMI.  Heart regular S1/S2, no S3/S4, no murmur.  No peripheral edema.  No carotid bruit.  2+ PT on left, unable to feel pulses on right.  Abdomen: Soft, nontender, no hepatosplenomegaly, no distention.  Neurologic: Alert and oriented x 3.  Psych: Normal affect. Extremities: No clubbing or cyanosis.   Assessment/Plan:  CAD  Occasional atypical chest pain. She had diffuse branch vessel disease on 2011 cath not amenable to PCI. Lexiscan myoview in 6/13 showed no ischemia or infarction. Continue ASA 81, bisoprolol, lisinopril, Imdur, Plavix, and statin. DYSLIPIDEMIA  Continue statin, check lipids today.  PAD Ongoing R>L calf pain with ambulation.  Cilostazol does not help much.  Peripheral angiography showed 50% right SFA stenosis, but pain seemed out  of proportion to PAD.  ? Neuropathic component to pain.  She was unable to tolerate gabapentin.  I asked her to talk to her PCP about a Lyrica trial. She will followup with Dr. Kirke Corin for PAD.    Marca Ancona 01/07/2013

## 2013-01-07 NOTE — Patient Instructions (Addendum)
Your physician recommends that you have a FASTING lipid profile today.  Ask your primary care doctor about prescribing Lyrica for possible neuropathy leg pain.   Your physician wants you to follow-up in: 6 months with Dr Shirlee Latch. (May 2015). You will receive a reminder letter in the mail two months in advance. If you don't receive a letter, please call our office to schedule the follow-up appointment.

## 2013-01-14 ENCOUNTER — Ambulatory Visit (INDEPENDENT_AMBULATORY_CARE_PROVIDER_SITE_OTHER): Payer: No Typology Code available for payment source | Admitting: Family Medicine

## 2013-01-14 ENCOUNTER — Encounter: Payer: Self-pay | Admitting: Family Medicine

## 2013-01-14 VITALS — BP 113/71 | HR 92 | Ht 62.5 in | Wt 168.0 lb

## 2013-01-14 DIAGNOSIS — I7389 Other specified peripheral vascular diseases: Secondary | ICD-10-CM

## 2013-01-14 DIAGNOSIS — E119 Type 2 diabetes mellitus without complications: Secondary | ICD-10-CM

## 2013-01-14 DIAGNOSIS — M792 Neuralgia and neuritis, unspecified: Secondary | ICD-10-CM

## 2013-01-14 DIAGNOSIS — IMO0002 Reserved for concepts with insufficient information to code with codable children: Secondary | ICD-10-CM

## 2013-01-14 DIAGNOSIS — M1710 Unilateral primary osteoarthritis, unspecified knee: Secondary | ICD-10-CM

## 2013-01-14 MED ORDER — METHYLPREDNISOLONE ACETATE 40 MG/ML IJ SUSP
40.0000 mg | Freq: Once | INTRAMUSCULAR | Status: AC
  Administered 2013-01-14: 40 mg via INTRA_ARTICULAR

## 2013-01-14 MED ORDER — HYDROCODONE-ACETAMINOPHEN 5-325 MG PO TABS: 1.0000 | ORAL_TABLET | Freq: Three times a day (TID) | ORAL | Status: DC | PRN

## 2013-01-14 MED ORDER — PREGABALIN 50 MG PO CAPS: 50.0000 mg | ORAL_CAPSULE | Freq: Three times a day (TID) | ORAL | Status: DC

## 2013-01-14 NOTE — Progress Notes (Signed)
Patient ID: [name]    DOB: 1955-02-21, 57 y.o.   MRN: [id number] --- Subjective:  Emmalina is a 57 y.o.female who presents for steroid injection in left knee.  - left knee: pain for 6 months. No injury or fall. Has had knee replacement of right knee and feels similar in the left knee to what it felt in the right. Pain is located in front and medial aspect of knee. Has some associated locking of the knee every 3-5 days. Pain with walking. Pain with extending and bending the knee. Better with sitting still. Pain at night.   - chronic right calf pain: unchanged. Takes vicodin 3 times a day. Cardiologist recommended trial of lyrica.   ROS: see HPI Past Medical History: reviewed and updated medications and allergies. Social History: Tobacco: none  Objective: Filed Vitals:   01/14/13 1107  BP: 113/71  Pulse: 92    Physical Examination:   General appearance - alert, well appearing, and in no distress Knee - tenderness along medial aspect of left knee joint line and below patella, no joint effusion or soft tissue swelling, negative mcmurrays, negative anterior drawer, negative valgus and varus stress Pain with flexion and extension. Range of motion: 5 degress extension to 110 flexion  Procedure: Left knee steroid injection Consent signed and scanned into record. Medication:  1 cc Depomedrol (40g)  2 cc Lidocaine !% without epi Preparation: area cleansed with alcohol and betadine Time Out taken  Injection  Landmarks identified 3 cc of medication injected into joint space using a lateral approach Patient tolerated well without bleeding or paresthesias  Patient had good range of motion of joint after injection

## 2013-01-14 NOTE — Patient Instructions (Signed)

## 2013-01-14 NOTE — Assessment & Plan Note (Signed)
Refill vicodin. Rx for lyrica to see if she tolerates this better than gabapentin.  Follow up in 1 month

## 2013-01-14 NOTE — Assessment & Plan Note (Signed)
Reviewed xray with patient showing arthritis and joint space narrowing most notably in medial aspect.  Steroid injection done today.

## 2013-01-18 ENCOUNTER — Other Ambulatory Visit: Payer: Self-pay

## 2013-01-18 MED ORDER — CILOSTAZOL 100 MG PO TABS: 100.0000 mg | ORAL_TABLET | Freq: Two times a day (BID) | ORAL | Status: DC

## 2013-01-27 ENCOUNTER — Encounter (HOSPITAL_COMMUNITY): Payer: Self-pay | Admitting: Emergency Medicine

## 2013-01-27 ENCOUNTER — Inpatient Hospital Stay (HOSPITAL_COMMUNITY)
Admission: EM | Admit: 2013-01-27 | Discharge: 2013-01-29 | DRG: 440 | Disposition: A | Discharge status: Home / Self Care | Payer: No Typology Code available for payment source | Attending: Family Medicine | Admitting: Family Medicine

## 2013-01-27 DIAGNOSIS — K2980 Duodenitis without bleeding: Secondary | ICD-10-CM

## 2013-01-27 DIAGNOSIS — Z7982 Long term (current) use of aspirin: Secondary | ICD-10-CM

## 2013-01-27 DIAGNOSIS — Z79899 Other long term (current) drug therapy: Secondary | ICD-10-CM

## 2013-01-27 DIAGNOSIS — M79609 Pain in unspecified limb: Secondary | ICD-10-CM

## 2013-01-27 DIAGNOSIS — M79604 Pain in right leg: Secondary | ICD-10-CM

## 2013-01-27 DIAGNOSIS — Z96659 Presence of unspecified artificial knee joint: Secondary | ICD-10-CM

## 2013-01-27 DIAGNOSIS — J449 Chronic obstructive pulmonary disease, unspecified: Secondary | ICD-10-CM | POA: Diagnosis present

## 2013-01-27 DIAGNOSIS — K219 Gastro-esophageal reflux disease without esophagitis: Secondary | ICD-10-CM

## 2013-01-27 DIAGNOSIS — M79605 Pain in left leg: Secondary | ICD-10-CM

## 2013-01-27 DIAGNOSIS — K861 Other chronic pancreatitis: Secondary | ICD-10-CM | POA: Diagnosis present

## 2013-01-27 DIAGNOSIS — J4489 Other specified chronic obstructive pulmonary disease: Secondary | ICD-10-CM | POA: Diagnosis present

## 2013-01-27 DIAGNOSIS — Z87891 Personal history of nicotine dependence: Secondary | ICD-10-CM

## 2013-01-27 DIAGNOSIS — I2510 Atherosclerotic heart disease of native coronary artery without angina pectoris: Secondary | ICD-10-CM | POA: Diagnosis present

## 2013-01-27 DIAGNOSIS — F17200 Nicotine dependence, unspecified, uncomplicated: Secondary | ICD-10-CM

## 2013-01-27 DIAGNOSIS — K859 Acute pancreatitis without necrosis or infection, unspecified: Principal | ICD-10-CM | POA: Diagnosis present

## 2013-01-27 DIAGNOSIS — I739 Peripheral vascular disease, unspecified: Secondary | ICD-10-CM | POA: Diagnosis present

## 2013-01-27 DIAGNOSIS — I10 Essential (primary) hypertension: Secondary | ICD-10-CM | POA: Diagnosis present

## 2013-01-27 DIAGNOSIS — R112 Nausea with vomiting, unspecified: Secondary | ICD-10-CM

## 2013-01-27 DIAGNOSIS — E119 Type 2 diabetes mellitus without complications: Secondary | ICD-10-CM

## 2013-01-27 DIAGNOSIS — E785 Hyperlipidemia, unspecified: Secondary | ICD-10-CM | POA: Diagnosis present

## 2013-01-27 LAB — COMPREHENSIVE METABOLIC PANEL
AST: 20 U/L (ref 0–37)
BUN: 30 mg/dL — ABNORMAL HIGH (ref 6–23)
CO2: 24 mEq/L (ref 19–32)
Calcium: 10 mg/dL (ref 8.4–10.5)
Chloride: 103 mEq/L (ref 96–112)
GFR calc Af Amer: 83 mL/min — ABNORMAL LOW (ref >90)
Glucose, Bld: 171 mg/dL — ABNORMAL HIGH (ref 70–99)
Total Bilirubin: 0.1 mg/dL — ABNORMAL LOW (ref 0.3–1.2)
Total Protein: 7.3 g/dL (ref 6.0–8.3)

## 2013-01-27 LAB — URINALYSIS, ROUTINE W REFLEX MICROSCOPIC
Bilirubin Urine: NEGATIVE
Glucose, UA: 250 mg/dL — AB
Hgb urine dipstick: NEGATIVE
Ketones, ur: NEGATIVE mg/dL
Specific Gravity, Urine: 1.025 (ref 1.005–1.030)
Urobilinogen, UA: 0.2 mg/dL (ref 0.0–1.0)

## 2013-01-27 LAB — CBC WITH DIFFERENTIAL/PLATELET
Eosinophils Absolute: 0.2 10*3/uL (ref 0.0–0.7)
Eosinophils Relative: 2 % (ref 0–5)
HCT: 38.3 % (ref 36.0–46.0)
Lymphs Abs: 1.1 10*3/uL (ref 0.7–4.0)
MCH: 29.5 pg (ref 26.0–34.0)
MCV: 88.9 fL (ref 78.0–100.0)
Monocytes Relative: 5 % (ref 3–12)
Platelets: 265 10*3/uL (ref 150–400)
RBC: 4.31 MIL/uL (ref 3.87–5.11)
WBC: 8.4 10*3/uL (ref 4.0–10.5)

## 2013-01-27 LAB — LIPASE, BLOOD: Lipase: 232 U/L — ABNORMAL HIGH (ref 11–59)

## 2013-01-27 MED ORDER — IOHEXOL 300 MG/ML  SOLN
20.0000 mL | INTRAMUSCULAR | Status: AC
  Administered 2013-01-27: 25 mL via ORAL

## 2013-01-27 MED ORDER — ONDANSETRON HCL 4 MG/2ML IJ SOLN
4.0000 mg | Freq: Once | INTRAMUSCULAR | Status: AC
  Administered 2013-01-27: 4 mg via INTRAVENOUS
  Filled 2013-01-27: qty 2

## 2013-01-27 MED ORDER — SODIUM CHLORIDE 0.9 % IV BOLUS (SEPSIS)
2000.0000 mL | Freq: Once | INTRAVENOUS | Status: AC
  Administered 2013-01-27: 1000 mL via INTRAVENOUS

## 2013-01-27 MED ORDER — MORPHINE SULFATE 4 MG/ML IJ SOLN
6.0000 mg | Freq: Once | INTRAMUSCULAR | Status: AC
  Administered 2013-01-27: 6 mg via INTRAVENOUS
  Filled 2013-01-27: qty 2

## 2013-01-27 MED ORDER — HYDROMORPHONE HCL PF 1 MG/ML IJ SOLN
1.0000 mg | Freq: Once | INTRAMUSCULAR | Status: AC
  Administered 2013-01-27: 1 mg via INTRAVENOUS
  Filled 2013-01-27: qty 1

## 2013-01-27 NOTE — ED Provider Notes (Signed)
CSN: [id number]     Arrival date & time 01/27/13  4098 History   First MD Initiated Contact with Patient 01/27/13 2040     Chief Complaint  Patient presents with  . Abdominal Pain   (Consider location/radiation/quality/duration/timing/severity/associated sxs/prior Treatment) The history is provided by the patient.  [name] is a 57 y.o. female history of CAD with no stents, pancreatitis here presenting with epigastric pain. Epigastric pain and nausea vomiting today. Pain is severe radiates to her flank pain. Denies fevers or chills. Denies any chest pain or shortness of breath. She states this feels like her previous pancreatitis. Denies alcohol use or history of gallstones.    Past Medical History  Diagnosis Date  . CAD (coronary artery disease)     ETT-myoview (12/10) with apical anterior ischemia, EF 75%.  LHC (1/11) showed diffuse disease..   There was a large D3 with serial 70%, 80-90%, and 70% stenoses then severe diffuse distal vessel disease.   Small to moderate OM1 with severe diffuse disease, 50% mRCA, 95% mid small PLV, 60% PDA.  EF 60%.  Patient managed medically, no interventional options.   . Other and unspecified hyperlipidemia 401.9    250.00  . Tobacco use disorder     quit 11/10  . Osteoarthritis     severe right knee  R TKR  . Asthma with COPD   . PAD (peripheral artery disease)     Severe stenosis mid right SFA s/p atherectomy 03/25/09. Moderate disease diffusely throughout the  iliacs, bilateral SFA, below knee vessels.  ABIs post-procedure were 0.85 on right and 0.99 on left.   . Pancreatitis 2003   Past Surgical History  Procedure Laterality Date  . Joint replacement  2011    right knee replacement   . Tubal ligation  1980   Family History  Problem Relation Age of Onset  . Emphysema Father    History  Substance Use Topics  . Smoking status: Former Smoker    Quit date: 12/13/2008  . Smokeless tobacco: Not on file  . Alcohol Use: No   OB History    Grav Para Term Preterm Abortions TAB SAB Ect Mult Living                 Review of Systems  Gastrointestinal: Positive for nausea and abdominal pain.  All other systems reviewed and are negative.    Allergies  Glucotrol; Influenza vaccines; Latex; Milk-related compounds; and Tetanus toxoid  Home Medications   Current Outpatient Rx  Name  Route  Sig  Dispense  Refill  . albuterol (PROVENTIL HFA;VENTOLIN HFA) 108 (90 BASE) MCG/ACT inhaler   Inhalation   Inhale 2 puffs into the lungs 2 (two) times daily as needed. For shortness of breath   3 Inhaler   3   . aspirin 81 MG tablet   Oral   Take 81 mg by mouth daily.           . bisoprolol-hydrochlorothiazide (ZIAC) 2.5-6.25 MG per tablet   Oral   Take 1 tablet by mouth daily.         . cilostazol (PLETAL) 100 MG tablet   Oral   Take 1 tablet (100 mg total) by mouth 2 (two) times daily.   60 tablet   6   . clopidogrel (PLAVIX) 75 MG tablet   Oral   Take 1 tablet (75 mg total) by mouth daily.   30 tablet   6   . esomeprazole (NEXIUM) 20 MG capsule  Oral   Take 20 mg by mouth daily at 12 noon.         . fish oil-omega-3 fatty acids 1000 MG capsule   Oral   Take 2 capsules (2 g total) by mouth 2 (two) times daily.   360 capsule   3   . Fluticasone-Salmeterol (ADVAIR DISKUS) 250-50 MCG/DOSE AEPB   Inhalation   Inhale 1 puff into the lungs every 12 (twelve) hours.   180 each   3   . HYDROcodone-acetaminophen (NORCO/VICODIN) 5-325 MG per tablet   Oral   Take 1 tablet by mouth 3 (three) times daily as needed.   90 tablet   0   . isosorbide mononitrate (IMDUR) 60 MG 24 hr tablet   Oral   Take 1.5 tablets (90 mg total) by mouth daily.   45 tablet   6     Clarification with Dr Shirlee Latch for 90 mg   . lisinopril (PRINIVIL,ZESTRIL) 10 MG tablet   Oral   Take 1 tablet (10 mg total) by mouth daily.   90 tablet   4   . metFORMIN (GLUCOPHAGE) 1000 MG tablet   Oral   Take 1 tablet (1,000 mg total) by  mouth 2 (two) times daily with a meal.   60 tablet   6   . Multiple Vitamin (MULTIVITAMIN) tablet   Oral   Take 1 tablet by mouth daily.           . rosuvastatin (CRESTOR) 40 MG tablet   Oral   Take 40 mg by mouth at bedtime.           BP 152/87  Pulse 84  Temp(Src) 98 F (36.7 C)  Resp 18  Ht 5' 2.25" (1.581 m)  Wt 171 lb (77.565 kg)  BMI 31.03 kg/m2  SpO2 96% Physical Exam  Nursing note and vitals reviewed. Constitutional: She is oriented to person, place, and time.  Uncomfortable   HENT:  Head: Normocephalic.  Mouth/Throat: Oropharynx is clear and moist.  Eyes: Conjunctivae are normal. Pupils are equal, round, and reactive to light.  Neck: Normal range of motion. Neck supple.  Cardiovascular: Normal rate, regular rhythm and normal heart sounds.   Pulmonary/Chest: Effort normal and breath sounds normal. No respiratory distress. She has no wheezes. She has no rales.  Abdominal: Soft. Bowel sounds are normal.  + epigastric tenderness. Mild RUQ tenderness, no murphy. No CVAT   Musculoskeletal: Normal range of motion.  Neurological: She is alert and oriented to person, place, and time.  Skin: Skin is warm and dry.  Psychiatric: She has a normal mood and affect. Her behavior is normal. Judgment and thought content normal.    ED Course  Procedures (including critical care time)   EMERGENCY DEPARTMENT US GALLBLADDER INTERPRETATION "Study: Limited Ultrasound of the gallbladder and common bile duct."  INDICATIONS: Abdominal pain Indication: Multiple views of the gallbladder and common bile duct are obtained with a  Multi-frequency probe."  PERFORMED BY:  Myself  IMAGES ARCHIVED?: Yes  FINDINGS: Gallstones absent, Sonographic Murphy's sign absent and Common bile duct normal in size  LIMITATIONS: Abdominal pain  INTERPRETATION: Normal  COMMENT:  Nl CBD. Nl gallbladder   Labs Review Labs Reviewed  URINALYSIS, ROUTINE W REFLEX MICROSCOPIC - Abnormal; Notable  for the following:    APPearance CLOUDY (*)    Glucose, UA 250 (*)    All other components within normal limits  CBC WITH DIFFERENTIAL - Abnormal; Notable for the following:    Neutrophils Relative %  80 (*)    All other components within normal limits  COMPREHENSIVE METABOLIC PANEL - Abnormal; Notable for the following:    Glucose, Bld 171 (*)    BUN 30 (*)    Total Bilirubin 0.1 (*)    GFR calc non Af Amer 72 (*)    GFR calc Af Amer 83 (*)    All other components within normal limits  LIPASE, BLOOD - Abnormal; Notable for the following:    Lipase 232 (*)    All other components within normal limits   Imaging Review No results found.  EKG Interpretation   None       MDM  No diagnosis found. [name] is a 57 y.o. female here with epigastric pain. Lipase elevated. She likely has pancreatitis. Will do bedside US to r/o gallstones. Will likely need CT ab/pel to assess severity. Will give pain meds and zofran and reassess.   10:33 PM Lipase elevated. CT pending. Requiring multiple rounds of pain meds. Will admit.    Richardean Canal, MD 01/27/13 [phone]

## 2013-01-27 NOTE — H&P (Signed)
Family Medicine Teaching [facility] Admission History and Physical Service Pager: [phone]  Patient name: [name] Medical record number: [id number] Date of birth: Nov 17, 1955 Age: 57 y.o. Gender: female  Primary Care Provider: Marena Chancy, MD Consultants: None Code Status: Full  Chief Complaint: Epigastric pain  Assessment and Plan: Etana Beets is a 57 y.o. female presenting with epigastric pain of sudden onset radiating to the left flank. PMH is significant for Pancreatitis, CAD, COPD, DMII, PVD with claudication  #Epigastric pain: likely due to pancreatitis vs MI vs angina vs PUD. Similar to presentation approx 5 years ago. Unknown etiology at that time (no gallstones, hyperlipidemia) however pt with heavy drinking approx 25 years ago. Has not had a drink since that time per pt report. Ranson's Criteria less than 5% mortality. Obtaining CT for grading and severity. No evidence of complications at this time. Will reassess pending read. Lipase 232. Bedside US without evidence of gallstones. BUN 30. Lipid panel wnl. Afebrile WBC 8.4. Will do cardiac r/o given hx of CAD -dilaudid 1mg  q 2 hours prn -bowel rest (NPO except medications and avoid nonessential meds such as pletal) -follow up abdominal pain, serial abdominal exams -holding possible offending agents including : class three medications- hctz, metformin and lisinopril  -IVF, NPO, ADAT -LDH to risk stratify -Bed rest with bathroom privileges  -cont crestor, nexium in case PUD contributing.   #Hx of CAD (Dr. Shirlee Latch [facility]): Per pt report stenosis in unstentable vessel. Cath in 2011. Lexiscan myoview 08/03/11 normal -obtaining EKG and trop X2 given epigastric/substernal discomfort  -cont plavix and asa  #COPD: on albuterol and advair at home, lung exam unremarkable, sating well on RA -cont home medications -pulse ox monitoring  #DMII: on metformin at home, 11/12/12 A1C 6.8 -holding metformin, esp in light of  contrast with CT scan and pancreatis  -NPO, CBG monitoring q6 with SSI  #HTN: on lisinopril, imdur, ziac; BP 152/87 -will hold HCTZ and lisinopril in house given that they are class 3 medications on acute pancreatis list -monitoring vs  #PVD with claudication: Endorses bilat leg pain, on chronic opioids, DP 1+ bilat -will hold home pain regimen and cont with dilaudid -expect pt will have higher threshold given chronic use   FEN/GI: NPO, IVF 1/2NS @125  Prophylaxis: lovenox  Disposition: admit to in patient under Dr. McDiarmid  History of Present Illness: [name] is a 57 y.o. female with hx of CAD(no stents) and pancreatitis (5 years ago) presenting with epigastric pain and nausea/vomiting of sudden onset. Describes pain as severe with radiation to her flank. Pain started this morning, previously in normal state of health. Did not wake her up, but noted first thing in the morning. Epigastric to RUQ radiating around to the left flank but also feels like it goes right through the body. Mild pain with deep breaths. Associated with nausea and vomiting (x3 NBNB). Normal bowel movements (every other day). Endorses cold chills. No fevers. Former smoker (quit 5 years ago). Severe pain gradually progressive throughout the day. Of note has a prior hx of alcohol use (20 years ago) but not currently drinking and no history of gallstones.   In the ED lipase found to be 232. Lipid panel WNL. Bedside US without evidence of gallstones. WBC 8.4 Afebrile. Given 1mg  dilaudid X1 and 6mg  morphine X1 with minimal relief. S/p 2L bolus NS in addtion to zofran.  Review Of Systems: Per HPI with the following additions: None Otherwise 12 point review of systems was performed and was unremarkable.  Patient Active Problem List   Diagnosis Date Noted  . Pancreatitis 01/27/2013  . Well woman exam with routine gynecological exam 09/09/2012  . Cutaneous skin tags 09/09/2012  . Bilateral leg pain 07/20/2012  . Right  wrist pain 12/23/2011  . Nausea and vomiting in adult 10/25/2011  . Arthritis of knee 10/25/2011  . GERD (gastroesophageal reflux disease) 11/04/2010  . CAD 03/02/2009  . PVD WITH CLAUDICATION 01/30/2009  . DIABETES MELLITUS, TYPE II 01/02/2009  . DYSLIPIDEMIA 01/02/2009  . TOBACCO ABUSE 01/02/2009  . HYPERTENSION 01/02/2009   Past Medical History: Past Medical History  Diagnosis Date  . CAD (coronary artery disease)     ETT-myoview (12/10) with apical anterior ischemia, EF 75%.  LHC (1/11) showed diffuse disease..   There was a large D3 with serial 70%, 80-90%, and 70% stenoses then severe diffuse distal vessel disease.   Small to moderate OM1 with severe diffuse disease, 50% mRCA, 95% mid small PLV, 60% PDA.  EF 60%.  Patient managed medically, no interventional options.   . Other and unspecified hyperlipidemia 401.9    250.00  . Tobacco use disorder     quit 11/10  . Osteoarthritis     severe right knee  R TKR  . Asthma with COPD   . PAD (peripheral artery disease)     Severe stenosis mid right SFA s/p atherectomy 03/25/09. Moderate disease diffusely throughout the  iliacs, bilateral SFA, below knee vessels.  ABIs post-procedure were 0.85 on right and 0.99 on left.   . Pancreatitis 2003   Past Surgical History: Past Surgical History  Procedure Laterality Date  . Joint replacement  2011    right knee replacement   . Tubal ligation  1980   Social History: History  Substance Use Topics  . Smoking status: Former Smoker    Quit date: 12/13/2008  . Smokeless tobacco: Not on file  . Alcohol Use: No   Additional social history: lives with husband, 2 dogs and bird.   Please also refer to relevant sections of EMR.  Family History: Family History  Problem Relation Age of Onset  . Emphysema Father    Allergies and Medications: Allergies  Allergen Reactions  . Glucotrol [Glipizide] Shortness Of Breath and Rash    Red rash, with breathing difficulty  . Influenza Vaccines  Other (See Comments)    Significant arm soreness requiring 1 year of physical therapy  . Latex Rash  . Milk-Related Compounds Rash    Fever  . Tetanus Toxoid Hives, Swelling and Rash    Swelling to site of injection with rash and fever   No current facility-administered medications on file prior to encounter.   Current Outpatient Prescriptions on File Prior to Encounter  Medication Sig Dispense Refill  . albuterol (PROVENTIL HFA;VENTOLIN HFA) 108 (90 BASE) MCG/ACT inhaler Inhale 2 puffs into the lungs 2 (two) times daily as needed. For shortness of breath  3 Inhaler  3  . aspirin 81 MG tablet Take 81 mg by mouth daily.        . cilostazol (PLETAL) 100 MG tablet Take 1 tablet (100 mg total) by mouth 2 (two) times daily.  60 tablet  6  . clopidogrel (PLAVIX) 75 MG tablet Take 1 tablet (75 mg total) by mouth daily.  30 tablet  6  . fish oil-omega-3 fatty acids 1000 MG capsule Take 2 capsules (2 g total) by mouth 2 (two) times daily.  360 capsule  3  . Fluticasone-Salmeterol (ADVAIR DISKUS) 250-50 MCG/DOSE AEPB  Inhale 1 puff into the lungs every 12 (twelve) hours.  180 each  3  . HYDROcodone-acetaminophen (NORCO/VICODIN) 5-325 MG per tablet Take 1 tablet by mouth 3 (three) times daily as needed.  90 tablet  0  . isosorbide mononitrate (IMDUR) 60 MG 24 hr tablet Take 1.5 tablets (90 mg total) by mouth daily.  45 tablet  6  . lisinopril (PRINIVIL,ZESTRIL) 10 MG tablet Take 1 tablet (10 mg total) by mouth daily.  90 tablet  4  . metFORMIN (GLUCOPHAGE) 1000 MG tablet Take 1 tablet (1,000 mg total) by mouth 2 (two) times daily with a meal.  60 tablet  6  . Multiple Vitamin (MULTIVITAMIN) tablet Take 1 tablet by mouth daily.        . rosuvastatin (CRESTOR) 40 MG tablet Take 40 mg by mouth at bedtime.       . [DISCONTINUED] omega-3 acid ethyl esters (LOVAZA) 1 G capsule Take 2 capsules (2 g total) by mouth 2 (two) times daily.  120 capsule  11    Objective: BP 152/87  Pulse 84  Temp(Src) 98 F  (36.7 C)  Resp 18  Ht 5' 2.25" (1.581 m)  Wt 171 lb (77.565 kg)  BMI 31.03 kg/m2  SpO2 96% Exam: General: NAD, sitting up in bed, husband at bedside, in apparent discomfort HEENT: NCAT, coarse voice, dry MM, PERRL, EOMI Cardiovascular: RRR Respiratory: CTAB Abdomen: tender in epigastric region with radiation to left flank, non distended, normoactive bowel sounds, no rebound or guarding  Extremities: WWP, TKR right, DPs 1+ bilaterally  Skin: no rashes or lesions Neuro: A&OX3  Labs and Imaging: CBC BMET   Recent Labs Lab 01/27/13 1910  WBC 8.4  HGB 12.7  HCT 38.3  PLT 265    Recent Labs Lab 01/27/13 1910  NA 139  K 4.2  CL 103  CO2 24  BUN 30*  CREATININE 0.89  GLUCOSE 171*  CALCIUM 10.0     US abdomen bedside  IMPRESSION:  Negative abdominal ultrasound.  Pending CT abdomen  Anselm Lis, MD 01/27/2013, 10:36 PM PGY-1, [facility] FPTS Intern pager: [phone], text pages welcome  Family Medicine Upper Level Addendum:   I have seen and examined the patient independently, discussed with Dr. Michail Jewels, fully reviewed the H+P and agree with it's contents with the additions as noted in blue text.    Tana Conch, MD, PGY-3 01/27/2013 11:46 PM

## 2013-01-27 NOTE — ED Notes (Signed)
MD at bedside. 

## 2013-01-27 NOTE — ED Notes (Signed)
The pt is c/o lt sided abd since this am  n v.  Hx of pancreatitis. lmp none

## 2013-01-27 NOTE — ED Notes (Signed)
Report given to Scarlette Calico, RN unit 6N.

## 2013-01-28 ENCOUNTER — Inpatient Hospital Stay (HOSPITAL_COMMUNITY): Payer: No Typology Code available for payment source

## 2013-01-28 ENCOUNTER — Telehealth: Payer: Self-pay | Admitting: Emergency Medicine

## 2013-01-28 DIAGNOSIS — K2980 Duodenitis without bleeding: Secondary | ICD-10-CM

## 2013-01-28 DIAGNOSIS — F17200 Nicotine dependence, unspecified, uncomplicated: Secondary | ICD-10-CM

## 2013-01-28 LAB — CBC
HCT: 34.9 % — ABNORMAL LOW (ref 36.0–46.0)
MCH: 29.5 pg (ref 26.0–34.0)
MCHC: 33.2 g/dL (ref 30.0–36.0)
RDW: 13.2 % (ref 11.5–15.5)

## 2013-01-28 LAB — GLUCOSE, CAPILLARY
Glucose-Capillary: 133 mg/dL — ABNORMAL HIGH (ref 70–99)
Glucose-Capillary: 109 mg/dL — ABNORMAL HIGH (ref 70–99)
Glucose-Capillary: 97 mg/dL (ref 70–99)

## 2013-01-28 LAB — COMPREHENSIVE METABOLIC PANEL
ALT: 24 U/L (ref 0–35)
AST: 18 U/L (ref 0–37)
Albumin: 3.7 g/dL (ref 3.5–5.2)
Alkaline Phosphatase: 62 U/L (ref 39–117)
CO2: 21 mEq/L (ref 19–32)
Creatinine, Ser: 0.74 mg/dL (ref 0.50–1.10)
GFR calc Af Amer: >90 mL/min (ref >90)
GFR calc non Af Amer: >90 mL/min (ref >90)
Glucose, Bld: 101 mg/dL — ABNORMAL HIGH (ref 70–99)
Potassium: 3.7 mEq/L (ref 3.5–5.1)
Sodium: 139 mEq/L (ref 135–145)
Total Protein: 6.5 g/dL (ref 6.0–8.3)

## 2013-01-28 LAB — LACTIC ACID, PLASMA: Lactic Acid, Venous: 1.3 mmol/L (ref 0.5–2.2)

## 2013-01-28 MED ORDER — HYDROMORPHONE HCL PF 1 MG/ML IJ SOLN
1.5000 mg | INTRAMUSCULAR | Status: DC | PRN
  Administered 2013-01-28 – 2013-01-29 (×3): 1.5 mg via INTRAVENOUS
  Filled 2013-01-28 (×4): qty 2

## 2013-01-28 MED ORDER — IOHEXOL 300 MG/ML  SOLN
80.0000 mL | Freq: Once | INTRAMUSCULAR | Status: AC | PRN
  Administered 2013-01-28: 80 mL via INTRAVENOUS

## 2013-01-28 MED ORDER — KETOROLAC TROMETHAMINE 30 MG/ML IJ SOLN
30.0000 mg | Freq: Three times a day (TID) | INTRAMUSCULAR | Status: DC | PRN
  Administered 2013-01-28 – 2013-01-29 (×2): 30 mg via INTRAVENOUS
  Filled 2013-01-28 (×2): qty 1

## 2013-01-28 MED ORDER — SODIUM CHLORIDE 0.45 % IV SOLN
INTRAVENOUS | Status: DC
  Administered 2013-01-28 – 2013-01-29 (×4): via INTRAVENOUS

## 2013-01-28 MED ORDER — GI COCKTAIL ~~LOC~~: 30.0000 mL | Freq: Two times a day (BID) | ORAL | Status: DC | PRN

## 2013-01-28 MED ORDER — ASPIRIN EC 81 MG PO TBEC
81.0000 mg | DELAYED_RELEASE_TABLET | Freq: Every day | ORAL | Status: DC
  Administered 2013-01-28 – 2013-01-29 (×2): 81 mg via ORAL
  Filled 2013-01-28 (×2): qty 1

## 2013-01-28 MED ORDER — TECHNETIUM TC 99M MEBROFENIN IV KIT: 5.0000 | PACK | Freq: Once | INTRAVENOUS | Status: AC | PRN

## 2013-01-28 MED ORDER — PANTOPRAZOLE SODIUM 40 MG PO TBEC: 40.0000 mg | DELAYED_RELEASE_TABLET | Freq: Every day | ORAL | Status: DC

## 2013-01-28 MED ORDER — ISOSORBIDE MONONITRATE ER 60 MG PO TB24
90.0000 mg | ORAL_TABLET | Freq: Every day | ORAL | Status: DC
  Administered 2013-01-28 – 2013-01-29 (×2): 90 mg via ORAL
  Filled 2013-01-28 (×2): qty 1

## 2013-01-28 MED ORDER — INSULIN ASPART 100 UNIT/ML ~~LOC~~ SOLN
0.0000 [IU] | SUBCUTANEOUS | Status: DC
  Administered 2013-01-28: 2 [IU] via SUBCUTANEOUS
  Administered 2013-01-28: 1 [IU] via SUBCUTANEOUS
  Administered 2013-01-29: 2 [IU] via SUBCUTANEOUS

## 2013-01-28 MED ORDER — MOMETASONE FURO-FORMOTEROL FUM 100-5 MCG/ACT IN AERO
2.0000 | INHALATION_SPRAY | Freq: Two times a day (BID) | RESPIRATORY_TRACT | Status: DC
  Administered 2013-01-28 – 2013-01-29 (×2): 2 via RESPIRATORY_TRACT
  Filled 2013-01-28 (×2): qty 8.8

## 2013-01-28 MED ORDER — ALBUTEROL SULFATE HFA 108 (90 BASE) MCG/ACT IN AERS: 2.0000 | INHALATION_SPRAY | Freq: Four times a day (QID) | RESPIRATORY_TRACT | Status: DC | PRN

## 2013-01-28 MED ORDER — SODIUM CHLORIDE 0.9 % IV SOLN: INTRAVENOUS | Status: DC

## 2013-01-28 MED ORDER — ONDANSETRON HCL 4 MG/2ML IJ SOLN
4.0000 mg | Freq: Four times a day (QID) | INTRAMUSCULAR | Status: DC | PRN
  Administered 2013-01-28 (×2): 4 mg via INTRAVENOUS
  Filled 2013-01-28 (×2): qty 2

## 2013-01-28 MED ORDER — ENOXAPARIN SODIUM 40 MG/0.4ML ~~LOC~~ SOLN
40.0000 mg | Freq: Every day | SUBCUTANEOUS | Status: DC
  Administered 2013-01-28 – 2013-01-29 (×2): 40 mg via SUBCUTANEOUS
  Filled 2013-01-28 (×2): qty 0.4

## 2013-01-28 MED ORDER — BISOPROLOL FUMARATE 5 MG PO TABS
2.5000 mg | ORAL_TABLET | Freq: Every day | ORAL | Status: DC
  Administered 2013-01-28 – 2013-01-29 (×2): 2.5 mg via ORAL
  Filled 2013-01-28 (×2): qty 0.5

## 2013-01-28 MED ORDER — ATORVASTATIN CALCIUM 20 MG PO TABS
20.0000 mg | ORAL_TABLET | Freq: Every day | ORAL | Status: DC
  Administered 2013-01-28 – 2013-01-29 (×2): 20 mg via ORAL
  Filled 2013-01-28 (×2): qty 1

## 2013-01-28 MED ORDER — CLOPIDOGREL BISULFATE 75 MG PO TABS
75.0000 mg | ORAL_TABLET | Freq: Every day | ORAL | Status: DC
  Administered 2013-01-28 – 2013-01-29 (×2): 75 mg via ORAL
  Filled 2013-01-28 (×3): qty 1

## 2013-01-28 MED ORDER — PANTOPRAZOLE SODIUM 40 MG IV SOLR
40.0000 mg | INTRAVENOUS | Status: DC
  Administered 2013-01-28 – 2013-01-29 (×2): 40 mg via INTRAVENOUS
  Filled 2013-01-28 (×3): qty 40

## 2013-01-28 MED ORDER — SODIUM CHLORIDE 0.9 % IJ SOLN: 3.0000 mL | Freq: Two times a day (BID) | INTRAMUSCULAR | Status: DC

## 2013-01-28 MED ORDER — HYDROMORPHONE HCL PF 1 MG/ML IJ SOLN
1.0000 mg | INTRAMUSCULAR | Status: DC | PRN
  Administered 2013-01-28 (×3): 1 mg via INTRAVENOUS
  Filled 2013-01-28 (×3): qty 1

## 2013-01-28 MED ORDER — GI COCKTAIL ~~LOC~~
30.0000 mL | Freq: Once | ORAL | Status: AC
  Administered 2013-01-28: 30 mL via ORAL
  Filled 2013-01-28: qty 30

## 2013-01-28 MED ORDER — ONDANSETRON HCL 4 MG PO TABS: 4.0000 mg | ORAL_TABLET | Freq: Four times a day (QID) | ORAL | Status: DC | PRN

## 2013-01-28 NOTE — Telephone Encounter (Signed)
error 

## 2013-01-28 NOTE — Telephone Encounter (Addendum)
[facility] called.  States pt is scheduled for a HIDA scan but wants to know if Dr. McDiarmid wants to also order EF??? Britta Mccreedy needs to know by 1200 today.

## 2013-01-28 NOTE — H&P (Signed)
I have seen and examined this patient. I have discussed with Dr(s) Michail Jewels and Hunter.  I agree with their findings and plans as documented in their admission notes.  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 - pt denies alcohol use in many years - Pt without overt gallstones on [place] in ED at admission - Pt with history of recurrent episodes of vomiting for over a year.  Has had work-up by Dr Gwenlyn Saran (including negative H. Pylori testing and unremarkable abdominal US in Fall 2013).   - Pt without problems of food intolerance or "fatty" stools or chronic diarrhea.  - Current classification of the Pancreatitis is idiopathic.  If relapses become more frequent, consider MRCP to look for disruption in pancreatic ducts.  Duodenitis on CT  - Uncertain if this is secondary to pancreatitis or primary cause of pancreatitis.  - History of recurrent nausea/vomiting in past that may have improved some on PPI. - ? NSAID use for chronic pain  Plan:  NPO excepts sips/chips/meds PPI IV Opiate analgesia IV HIDA scan to look for biliary dysfunction in work-up of recurrent n/v outside of this acute illness.

## 2013-01-28 NOTE — Telephone Encounter (Signed)
Per Dr. McDiarmid... He does want the EF [facility] and notified her at [phone]

## 2013-01-28 NOTE — Progress Notes (Addendum)
Family Medicine Teaching Service Daily Progress Note Intern Pager: [phone]  Patient name: [name] Medical record number: [id number] Date of birth: 1955-08-12 Age: 57 y.o. Gender: female  Primary Care Provider: Marena Chancy, MD Consultants: None Code Status: Full  Pt Overview and Major Events to Date:  Assessment and Plan: [name] is a 57 y.o. female presenting with epigastric pain of sudden onset radiating to the left flank. PMH is significant for Pancreatitis, CAD, COPD, DMII, PVD with claudication   #Epigastric pain: likely due to pancreatitis vs MI vs angina vs PUD. Currently pancreatitis idiopathic. Attempted to obtain HIDA yesterday but pt not amenable. Evidence of duodenitis on CT (unclear whether 2/2 pancreatitis or is primary cause?) Attempted to obtain HIDA to assess for biliary dysfxn in light of recurrent n/v but pt refused 2/2 SOB dizziness concerns. Lactic acid 1.3. Pt much more comfortable this morning, will plan to transition to oral pain med as tolerates used 150mg  morphine equiv total yesterday. -can transition over to oral pain regimen as able to tolerate PO; oxy ir q4 and tylenol q6  -ADAT, did well with clears, try fulls this am -follow up abdominal pain, serial abdominal exams  -holding possible offending agents including : class three medications- hctz, metformin and lisinopril  -Bed rest with bathroom privileges  -cont crestor, nexium in case PUD contributing.   #Hx of CAD (Dr. Shirlee Latch [facility]): Per pt report stenosis in unstentable vessel. Cath in 2011. Lexiscan myoview 08/03/11 normal. Trop neg X2, EKG NSR -cont plavix and asa   #COPD: on albuterol and advair at home, lung exam unremarkable, sating well on RA  -cont home medications  -pulse ox monitoring   #DMII: on metformin at home, 11/12/12 A1C 6.8  -holding metformin, esp in light of contrast with CT scan and pancreatis  -NPO, CBG monitoring q6 with SSI   #HTN: on lisinopril, imdur, ziac;  BP 152/87  -will hold HCTZ and lisinopril in house given that they are class 3 medications on acute pancreatis list  -monitoring vs   #headache: c/o tension headache this am, no documented headache as problem list, but endorses hx of migraines throughout her life  #PVD with claudication: Endorses bilat leg pain, on chronic opioids, DP 1+ bilat  -will hold home pain regimen and cont with dilaudid  -expect pt will have higher threshold given chronic use   FEN/GI: NPO, IVF 1/2NS@125  PPx: lovenox  Disposition: pending improvement in sx, advancing diet as tolerated  Subjective: Epigastric pain much improved, c/o tension pressure headache this morning  Objective: Temp:  [97.8 F (36.6 C)-98.3 F (36.8 C)] 98.3 F (36.8 C) (12/16 0502) Pulse Rate:  [72-91] 83 (12/16 0502) Resp:  [16-18] 18 (12/16 0502) BP: (99-133)/(63-82) 133/75 mmHg (12/16 0502) SpO2:  [96 %-100 %] 96 % (12/16 0502) Physical Exam: General: NAD, lying quietly in bed, in discomfort  HEENT: NCAT, coarse voice, PERRL, EOMI  Cardiovascular: RRR, no m/r/g Respiratory: CTAB no wheezes Abdomen: tender in epigastric region with radiation to left flank, non distended, normoactive bowel sounds, no rebound or guarding  Extremities: WWP, TKR right, DPs 1+ bilaterally  Skin: no rashes or lesions  Neuro: A&OX3   Laboratory:  Recent Labs Lab 01/27/13 1910 01/28/13 0045  WBC 8.4 6.4  HGB 12.7 11.6*  HCT 38.3 34.9*  PLT 265 241    Recent Labs Lab 01/27/13 1910 01/28/13 0045  NA 139 139  K 4.2 3.7  CL 103 105  CO2 24 21  BUN 30* 21  CREATININE  0.89 0.74  CALCIUM 10.0 9.1  PROT 7.3 6.5  BILITOT 0.1* <0.1*  ALKPHOS 77 62  ALT 27 24  AST 20 18  GLUCOSE 171* 101*   Trop neg X1  Imaging/Diagnostic Tests: Bedside US- neg for gallstones  CT ab/pelvis 12/15 IMPRESSION:  Wall thickening of the duodenum extending into proximal jejunal  loops with portions of the infiltrative changes adjacent to the  uncinate  in head of pancreas.  Findings may represent enteritis of the duodenum and proximal  jejunum or less likely small bowel changes secondary to focal  pancreatitis.  Correlation with serum amylase recommended   Anselm Lis, MD 01/29/2013, 7:33 AM PGY-1, [place] FPTS Intern pager: [phone], text pages welcome  FMTS Attending NOte Patient seen and examined by me today, she is much more comfortable and tolerating liquids.  Advance diet as tolerated, hold IVF in preparation for discharge. Paula Compton, MD

## 2013-01-28 NOTE — Progress Notes (Signed)
Family Medicine Teaching Service Daily Progress Note Intern Pager: [phone]  Patient name: [name] Medical record number: [id number] Date of birth: January 22, 1956 Age: 57 y.o. Gender: female  Primary Care Provider: Marena Chancy, MD Consultants: None Code Status: Full  Pt Overview and Major Events to Date:  Assessment and Plan: [name] is a 57 y.o. female presenting with epigastric pain of sudden onset radiating to the left flank. PMH is significant for Pancreatitis, CAD, COPD, DMII, PVD with claudication   #Epigastric pain: likely due to pancreatitis vs MI vs angina vs PUD. Similar to presentation approx 5 years ago. Unknown etiology at that time (no gallstones, hyperlipidemia) however pt with heavy drinking approx 25 years ago. Has not had a drink since that time per pt report. Ranson's Criteria less than 5% mortality. CT for grading with evidence of wall thickening of duodenum consistent with enteritis or focal pancreatitis. Lipase 232. Bedside US without evidence of gallstones. BUN 30. Lipid panel wnl. Afebrile WBC 6.4. Cardiac w/up in process but neg thus far. APACHE II score 5 this am -dilaudid 1mg  q 2 hours prn- appears to mildly control pain only  -bowel rest (NPO except medications and avoid nonessential meds such as pletal)  -follow up abdominal pain, serial abdominal exams  -holding possible offending agents including : class three medications- hctz, metformin and lisinopril  -IVF, NPO, ADAT  -LDH to risk stratify  -Bed rest with bathroom privileges  -cont crestor, nexium in case PUD contributing.   #Hx of CAD (Dr. Shirlee L[facility]): Per pt report stenosis in unstentable vessel. Cath in 2011. Lexiscan myoview 08/03/11 normal. Trop neg X1 -obtaining EKG and trop X2 given epigastric/substernal discomfort  -cont plavix and asa   #COPD: on albuterol and advair at home, lung exam unremarkable, sating well on RA  -cont home medications  -pulse ox monitoring   #DMII: on  metformin at home, 11/12/12 A1C 6.8  -holding metformin, esp in light of contrast with CT scan and pancreatis  -NPO, CBG monitoring q6 with SSI   #HTN: on lisinopril, imdur, ziac; BP 152/87  -will hold HCTZ and lisinopril in house given that they are class 3 medications on acute pancreatis list  -monitoring vs   #PVD with claudication: Endorses bilat leg pain, on chronic opioids, DP 1+ bilat  -will hold home pain regimen and cont with dilaudid  -expect pt will have higher threshold given chronic use   FEN/GI: NPO, IVF 1/2NS@125  PPx: lovenox  Disposition: pending improvement in sx, advancing diet as tolerated  Subjective: Still with intense pain o/n, minimal improvement  Objective: Temp:  [97.3 F (36.3 C)-98.3 F (36.8 C)] 98.3 F (36.8 C) (12/15 4401) Pulse Rate:  [84-95] 86 (12/15 0613) Resp:  [18] 18 (12/14 1855) BP: (126-152)/(73-91) 140/82 mmHg (12/15 0613) SpO2:  [96 %-100 %] 100 % (12/15 0613) Weight:  [171 lb (77.565 kg)] 171 lb (77.565 kg) (12/14 1855) Physical Exam: General: NAD, lying quietly in bed, in discomfort  HEENT: NCAT, coarse voice, PERRL, EOMI  Cardiovascular: RRR, no m/r/g Respiratory: CTAB no wheezes Abdomen: tender in epigastric region with radiation to left flank, non distended, normoactive bowel sounds, no rebound or guarding  Extremities: WWP, TKR right, DPs 1+ bilaterally  Skin: no rashes or lesions  Neuro: A&OX3   Laboratory:  Recent Labs Lab 01/27/13 1910 01/28/13 0045  WBC 8.4 6.4  HGB 12.7 11.6*  HCT 38.3 34.9*  PLT 265 241    Recent Labs Lab 01/27/13 1910 01/28/13 0045  NA 139 139  K 4.2 3.7  CL 103 105  CO2 24 21  BUN 30* 21  CREATININE 0.89 0.74  CALCIUM 10.0 9.1  PROT 7.3 6.5  BILITOT 0.1* <0.1*  ALKPHOS 77 62  ALT 27 24  AST 20 18  GLUCOSE 171* 101*   Trop neg X1  Imaging/Diagnostic Tests: Bedside US- neg for gallstones  CT ab/pelvis 12/15 IMPRESSION:  Wall thickening of the duodenum extending into  proximal jejunal  loops with portions of the infiltrative changes adjacent to the  uncinate in head of pancreas.  Findings may represent enteritis of the duodenum and proximal  jejunum or less likely small bowel changes secondary to focal  pancreatitis.  Correlation with serum amylase recommended   Anselm Lis, MD 01/28/2013, 7:29 AM PGY-1, [facility] Family Medicine FPTS Intern pager: [phone], text pages welcome

## 2013-01-29 LAB — GLUCOSE, CAPILLARY
Glucose-Capillary: 112 mg/dL — ABNORMAL HIGH (ref 70–99)
Glucose-Capillary: 163 mg/dL — ABNORMAL HIGH (ref 70–99)
Glucose-Capillary: 112 mg/dL — ABNORMAL HIGH (ref 70–99)
Glucose-Capillary: 164 mg/dL — ABNORMAL HIGH (ref 70–99)

## 2013-01-29 MED ORDER — OXYCODONE HCL 5 MG PO TABS
10.0000 mg | ORAL_TABLET | ORAL | Status: DC | PRN
  Administered 2013-01-29 (×2): 10 mg via ORAL
  Filled 2013-01-29 (×2): qty 2

## 2013-01-29 MED ORDER — FLUTICASONE PROPIONATE 50 MCG/ACT NA SUSP
1.0000 | Freq: Every day | NASAL | Status: DC
  Administered 2013-01-29: 1 via NASAL
  Filled 2013-01-29: qty 16

## 2013-01-29 MED ORDER — OXYCODONE HCL 10 MG PO TABS: 10.0000 mg | ORAL_TABLET | ORAL | Status: DC | PRN

## 2013-01-29 MED ORDER — ACETAMINOPHEN 325 MG PO TABS
650.0000 mg | ORAL_TABLET | Freq: Four times a day (QID) | ORAL | Status: DC | PRN
  Administered 2013-01-29 (×2): 650 mg via ORAL
  Filled 2013-01-29 (×2): qty 2

## 2013-01-29 MED ORDER — HYDROMORPHONE HCL PF 1 MG/ML IJ SOLN: 1.0000 mg | INTRAMUSCULAR | Status: DC | PRN

## 2013-01-29 NOTE — Progress Notes (Signed)
Discharged instructions given to patient. Stated understanding.D/C home in good condition.

## 2013-01-29 NOTE — Progress Notes (Signed)
Discharge instructions reviewed with patient by Donetta Potts RN. Questions answered. Patient discharged to home via wheelchair. Printed AVS given to patient.

## 2013-01-30 NOTE — Discharge Summary (Signed)
Family Medicine Teaching [facility] Discharge Summary  Patient name: [name] Medical record number: [id number] Date of birth: March 17, 1955 Age: 57 y.o. Gender: female Date of Admission: 01/27/2013  Date of Discharge: 01/29/13 Admitting Physician: Leighton Roach McDiarmid, MD  Primary Care Provider: Marena Chancy, MD Consultants: None  Indication for Hospitalization: epigastric pain, pancreatitis   Discharge Diagnoses/Problem List:  -epigastric pain -pancreatitis -hx of CAD -COPD -DMII -HTN -PVD with claudication  Disposition: home  Discharge Condition: improved  Discharge Exam:  BP 122/75  Pulse 70  Temp(Src) 98.3 F (36.8 C) (Oral)  Resp 18  Ht 5' 2.25" (1.581 m)  Wt 171 lb (77.565 kg)  BMI 31.03 kg/m2  SpO2 100% General: NAD, lying quietly in bed, in discomfort  HEENT: NCAT, coarse voice, PERRL, EOMI  Cardiovascular: RRR, no m/r/g  Respiratory: CTAB no wheezes  Abdomen: tender in epigastric region with radiation to left flank, non distended, normoactive bowel sounds, no rebound or guarding  Extremities: WWP, TKR right, DPs 1+ bilaterally  Skin: no rashes or lesions  Neuro: A&OX3  Brief Hospital Course:  #Epigastric pain: Pt presenting with acute onset of epigastric pain upon wakening in morning of admission. No PO intake at that time. Pain was described as severe sharp with radiation to the left flank. No other acute illnesses. In the [facility] was found to be 232. Lipid panel WNL from recent outpt study. Bedside US without evidence of gallstones. WBC 8.4 Afebrile. Given 1mg  dilaudid X1 and 6mg  morphine X1 with minimal relief. S/p 2L bolus NS in addtion to zofran. Pt admitted for pain control and bowel rest. Possible offending agents held including hctz, metformin and lisinopril (class III in pancreatitis) Felt epigastric pain likely 2/2 recurrence of idiopathic pancreatitis vs MI vs angina vs PUD. Similar to presentation approx 5 years ago. Unknown etiology at that  time (no gallstones, hyperlipidemia) however pt with heavy drinking approx 25 years ago. Has not had a drink since that time per pt report. On admission Ranson's Criteria less than 5% mortality. CT for grading with evidence of wall thickening of duodenum consistent with enteritis or focal pancreatitis. Pt NPO with IVF and pain controlled with dilaudid 1mg  q2. Initially not controlling pain and thus pain medications increased to 1.5mg q2 with better relief. Attempted to obtain HIDA given severity of pain and recurrent episodes of n/v outside of this acute setting, however pt unable to lay still for testing and refused. Pt eventually spaced and switched to PO medications as diet was ADAT. She was stable for d/c'd with close outpt f/up and rx of oxy10 q4 as indicated (likely pt to have harder time controlling pain as she is on chronic narcs at home). She will cont crestor for risk modication and nexium in event that PUD contributing.   #Hx of CAD (Dr. Shirlee L[facility]):  Per pt report stenosis in unstentable vessel. Cath in 2011. Lexiscan myoview 08/03/11 normal. Pt denying pain in chest or palps but given hx of CAD and location of pain, chest pain r/o initiated and was negative. Pt was cont'd on ASA and plavix and will cont on d/c. F/up with cardiology and Dr. Shirlee Latch as needed.   #COPD: On albuterol and advair at home, lung exam unremarkable, sating well on RA throughout admission.   #DMII: On metformin at home, 11/12/12 A1C 6.8. Initially metformin held, esp in light of contrast with CT scan and pancreatis. CBGs 100-200s. Would reassess on outpt setting given class iii med.  #HTN: on lisinopril, imdur, ziac; BP  152/87 on admission. Pt restarted only on imdur (given above). BPs 120-130s. Would reassess need for multiple medications on outpt setting.   #PVD with claudication/bilat leg pain/arthritis: Endorsed bilat leg pain on admission, which is baseline for her. Cilostazol held in attempt to simply medication  regimen. Home pain regimen held in light of dilaudid dosing. Given rx for oxyir in the acute recovery period, can expect pt to return to home med need.  Issues for Follow Up:  1. Etiology of pancreatitis- i.e. Re-consideration of class 3 medications- including lisinopril, HCTZ, metformin 2. Need for HIDA to eval bilary system in light of recurrent episodes and prolonged hx of n/v  Significant Procedures: None  Significant Labs and Imaging:   Recent Labs Lab 01/27/13 1910 01/28/13 0045  WBC 8.4 6.4  HGB 12.7 11.6*  HCT 38.3 34.9*  PLT 265 241    Recent Labs Lab 01/27/13 1910 01/28/13 0045  NA 139 139  K 4.2 3.7  CL 103 105  CO2 24 21  GLUCOSE 171* 101*  BUN 30* 21  CREATININE 0.89 0.74  CALCIUM 10.0 9.1  ALKPHOS 77 62  AST 20 18  ALT 27 24  ALBUMIN 4.2 3.7   Bedside US- neg for gallstones   CT ab/pelvis 12/15  IMPRESSION:  Wall thickening of the duodenum extending into proximal jejunal  loops with portions of the infiltrative changes adjacent to the  uncinate in head of pancreas.  Findings may represent enteritis of the duodenum and proximal  jejunum or less likely small bowel changes secondary to focal  pancreatitis.  Correlation with serum amylase recommended   Results/Tests Pending at Time of Discharge: None  Discharge Medications:    Medication List         albuterol 108 (90 BASE) MCG/ACT inhaler  Commonly known as:  PROVENTIL HFA;VENTOLIN HFA  Inhale 2 puffs into the lungs 2 (two) times daily as needed. For shortness of breath     aspirin 81 MG tablet  Take 81 mg by mouth daily.     bisoprolol-hydrochlorothiazide 2.5-6.25 MG per tablet  Commonly known as:  ZIAC  Take 1 tablet by mouth daily.     cilostazol 100 MG tablet  Commonly known as:  PLETAL  Take 1 tablet (100 mg total) by mouth 2 (two) times daily.     clopidogrel 75 MG tablet  Commonly known as:  PLAVIX  Take 1 tablet (75 mg total) by mouth daily.     esomeprazole 20 MG capsule   Commonly known as:  NEXIUM  Take 20 mg by mouth daily at 12 noon.     fish oil-omega-3 fatty acids 1000 MG capsule  Take 2 capsules (2 g total) by mouth 2 (two) times daily.     Fluticasone-Salmeterol 250-50 MCG/DOSE Aepb  Commonly known as:  ADVAIR DISKUS  Inhale 1 puff into the lungs every 12 (twelve) hours.     HYDROcodone-acetaminophen 5-325 MG per tablet  Commonly known as:  NORCO/VICODIN  Take 1 tablet by mouth 3 (three) times daily as needed.     isosorbide mononitrate 60 MG 24 hr tablet  Commonly known as:  IMDUR  Take 1.5 tablets (90 mg total) by mouth daily.     lisinopril 10 MG tablet  Commonly known as:  PRINIVIL,ZESTRIL  Take 1 tablet (10 mg total) by mouth daily.     metFORMIN 1000 MG tablet  Commonly known as:  GLUCOPHAGE  Take 1 tablet (1,000 mg total) by mouth 2 (two) times daily with  a meal.     multivitamin tablet  Take 1 tablet by mouth daily.     Oxycodone HCl 10 MG Tabs  Take 1 tablet (10 mg total) by mouth every 4 (four) hours as needed for severe pain.     rosuvastatin 40 MG tablet  Commonly known as:  CRESTOR  Take 40 mg by mouth at bedtime.        Discharge Instructions: Please refer to Patient Instructions section of EMR for full details.  Patient was counseled important signs and symptoms that should prompt return to medical care, changes in medications, dietary instructions, activity restrictions, and follow up appointments.   Follow-Up Appointments:     Follow-up Information   Call Marena Chancy, MD. (please call tomorrow morning to make a hospital follow-up for some time in the next week)    Specialty:  Family Medicine   Contact information:   [address] [place] [address] [phone]       Anselm Lis, MD 01/30/2013, 4:51 PM PGY-1, [facility] Family Medicine

## 2013-01-31 NOTE — Discharge Summary (Signed)
Discussed plan for discharge with resident team.  Paula Compton, MD

## 2013-02-05 ENCOUNTER — Ambulatory Visit (INDEPENDENT_AMBULATORY_CARE_PROVIDER_SITE_OTHER): Payer: No Typology Code available for payment source | Admitting: Family Medicine

## 2013-02-05 ENCOUNTER — Encounter: Payer: Self-pay | Admitting: Family Medicine

## 2013-02-05 VITALS — BP 149/84 | HR 91 | Temp 97.8°F | Ht 62.25 in | Wt 166.0 lb

## 2013-02-05 DIAGNOSIS — E119 Type 2 diabetes mellitus without complications: Secondary | ICD-10-CM

## 2013-02-05 DIAGNOSIS — K859 Acute pancreatitis without necrosis or infection, unspecified: Secondary | ICD-10-CM

## 2013-02-05 MED ORDER — ONDANSETRON HCL 4 MG PO TABS: 4.0000 mg | ORAL_TABLET | Freq: Three times a day (TID) | ORAL | Status: DC | PRN

## 2013-02-05 MED ORDER — HYDROCODONE-ACETAMINOPHEN 5-325 MG PO TABS: 1.0000 | ORAL_TABLET | Freq: Three times a day (TID) | ORAL | Status: DC | PRN

## 2013-02-05 NOTE — Patient Instructions (Signed)
Give the HIDA scan some thought and call me after the holidays to let me know what you have decided. Followup with me in 2 months   HIDA (Hepatobiliary) Scan Your caregiver has suggested that you have a HIDA Scan. This is also known as a hepatobiliary scan. The HIDA Scan helps evaluate the hepatobiliary system (liver and gallbladder and their ducts). Your liver is the organ in your body that produces bile. The bile is then collected in the gallbladder. The bile is stored and concentrated in the gallbladder. The bile is excreted (passed) into the small intestine when it is needed for digestion. A stone can block the duct (tube) leading from the gallbladder to the small intestine. This can cause an inflammation of the gallbladder (cholecystitis). Because bile is always needed for fat processing, you may feel a gallbladder attack especially after eating a fatty meal. LET YOUR CAREGIVER KNOW ABOUT:  Allergies.  Medications taken including herbs, eye drops, over the counter medications, and creams.  Use of steroids (by mouth or creams).  Previous problems with anesthetics or novocaine.  Possibility of pregnancy, if this applies.  History of blood clots (thrombophlebitis).  History of bleeding or blood problems.  Previous surgery.  Other health problems. BEFORE THE PROCEDURE  Do not eat or drink anything after midnight the night before the exam as instructed.  You may take medications with a small amount of water the morning of the exam unless your caregiver instructs you otherwise. You should be present 60 minutes prior to your procedure or as directed.  PROCEDURE   An IV will be placed in your arm and remain throughout the exam.  A small amount of very short acting radioactive material will be injected into the IV.  While lying down a special camera will be placed over your abdomen (belly). This camera is used to detect the injected material. The camera will place images on film. A  radiologist (specialist in reading x-rays) can evaluate the images. It will help determine how well your gallbladder is working.  You will then be given a material called CCK. This will make your gallbladder contract. It occasionally causes symptoms (problems) that mimic a gallbladder attack or the feeling you have after eating a fatty meal.  The entire test usually takes one to two hours. Your caregiver can give you more accurate times. Following the test you may go home and resume normal activities and diet as instructed. Ask your caregiver how you are to find out your results. Remember, it is your responsibility to find out the results of your test. Do not assume everything is all right or "normal" if you have not heard from your caregiver. Document Released: 01/29/2000 Document Revised: 04/25/2011 Document Reviewed: 01/31/2005 [facility] Patient Information 2014 [employer], [place].

## 2013-02-10 NOTE — Progress Notes (Signed)
Patient ID: [name]    DOB: February 01, 1956, 57 y.o.   MRN: [id number] --- Subjective:  [name] is a 57 y.o.female who presents for hospital follow up for pancreatitis. She was discharged on 01/30/13. Since then, her abdominal pain has significantly improved. She continues to have some discomfort on the left side radiating to the back. Worst with eating spicy or acid foods. She still has some nausea and vomiting up to twice a day, every other day. She has been able to keep down fluids despite emesis.   ROS: see HPI Past Medical History: reviewed and updated medications and allergies. Social History: Tobacco: none  Objective: Filed Vitals:   02/05/13 1449  BP: 149/84  Pulse: 91  Temp: 97.8 F (36.6 C)    Physical Examination:   General appearance - alert, well appearing, and in no distress Chest - clear to auscultation, no wheezes or crackles Heart - normal rate, regular rhythm, normal S1, S2, no murmurs Abdomen - soft, tender along epigastrium, no rebound, no guarding.  Extremities - no edema.

## 2013-02-10 NOTE — Assessment & Plan Note (Addendum)
Improving. No etiology for pancreatitis at this time. Patient initially did not tolerate the HIDA scan and is now hesitant to have it done. Gave her information about it for her to think about it over the holidays and call the office to let me know either way. Told her that I strongly recommend the HIDA at this time.  - Rx for zofran for nausea

## 2013-02-12 ENCOUNTER — Telehealth: Payer: Self-pay | Admitting: Cardiology

## 2013-02-12 NOTE — Telephone Encounter (Signed)
I spoke with Dr. Shirlee Latch who advised that he is willing to assist patient with paper work/shipment of medication but that he is not willing to manage Lyrica since this is a PCP prescribed medication for a non-cardiac problem.  I called patient and questioned reason [facility] would not accept shipment of medication and patient states she did not understand - states they said they weren't set up to manage medication shipments.  Patient states she received the paperwork for Rx assistance from the health department.  I advised patient of Dr. Alford Highland willingness to help but that he does not manage Lyrica and that Family Practice needs to continue to manage the medication.  I advised patient that I will send message to Katina Dung, RN, Dr. Alford Highland primary nurse for advice and that she may drop off the paperwork at our office for review.  Patient verbalized understanding and agreement.

## 2013-02-12 NOTE — Telephone Encounter (Signed)
Spoke with patient who states she needs Rx assistance for Lyrica.  Patient has a form from [employer] but the medication cannot be mailed to her, it has to be sent to a doctor's office.  Patient has contacted [facility] and they say they are not equipped to order and receive the medication and asked if Dr. Shirlee Latch would be willing.  I advised that I will discuss with Dr. Shirlee Latch and will call her back.  Patient verbalized understanding and agreement.

## 2013-02-12 NOTE — Telephone Encounter (Signed)
New Problem:  Pt is calling in reference to her Lyrica. Pt states she has a form from [employer] for Dr. Shirlee Latch where she can get her meds cheaper. Pt would like a call back letting her know if Dr. Shirlee Latch can fill it out. If so, she will drop it off. PT would like a call back.

## 2013-02-15 ENCOUNTER — Other Ambulatory Visit: Payer: Self-pay

## 2013-02-15 MED ORDER — CLOPIDOGREL BISULFATE 75 MG PO TABS: 75.0000 mg | ORAL_TABLET | Freq: Every day | ORAL | Status: DC

## 2013-02-18 ENCOUNTER — Other Ambulatory Visit: Payer: Self-pay

## 2013-02-18 ENCOUNTER — Other Ambulatory Visit: Payer: Self-pay | Admitting: *Deleted

## 2013-02-18 MED ORDER — BISOPROLOL-HYDROCHLOROTHIAZIDE 2.5-6.25 MG PO TABS
1.0000 | ORAL_TABLET | Freq: Every day | ORAL | Status: DC
Start: 2013-02-18 — End: 2013-08-15

## 2013-02-18 MED ORDER — CLOPIDOGREL BISULFATE 75 MG PO TABS: 75.0000 mg | ORAL_TABLET | Freq: Every day | ORAL | Status: DC

## 2013-02-19 NOTE — Telephone Encounter (Signed)
After talking with patient I do not believe I will be able to help her with completing paperwork for Lyrica since Dr Shirlee LatchMcLean is not the prescribing doctor.

## 2013-02-26 ENCOUNTER — Ambulatory Visit: Payer: No Typology Code available for payment source

## 2013-03-14 ENCOUNTER — Telehealth: Payer: Self-pay | Admitting: Family Medicine

## 2013-03-14 DIAGNOSIS — E119 Type 2 diabetes mellitus without complications: Secondary | ICD-10-CM

## 2013-03-14 NOTE — Telephone Encounter (Signed)
Will fwd to MD.  Jakai Risse L, CMA  

## 2013-03-14 NOTE — Telephone Encounter (Signed)
Would like refill on hydrocodone Please call when ready to pick up

## 2013-03-15 MED ORDER — HYDROCODONE-ACETAMINOPHEN 5-325 MG PO TABS: 1.0000 | ORAL_TABLET | Freq: Three times a day (TID) | ORAL | Status: DC | PRN

## 2013-03-15 NOTE — Telephone Encounter (Signed)
Called cell number for patient.  Someone answered and told me to call [phone].  I did.  That number disconnected.  If patient calls back, please see message below.  Loralye Loberg, Darlyne RussianKristen L, CMA

## 2013-03-15 NOTE — Telephone Encounter (Signed)
Please let patient know that Rx is ready for pick up at the front desk.  THank you!  Marena ChancyStephanie Reyhan Moronta, PGY-3 Family Medicine Resident

## 2013-03-27 ENCOUNTER — Other Ambulatory Visit: Payer: Self-pay

## 2013-03-27 MED ORDER — ISOSORBIDE MONONITRATE ER 60 MG PO TB24: 90.0000 mg | ORAL_TABLET | Freq: Every day | ORAL | Status: DC

## 2013-03-29 ENCOUNTER — Telehealth: Payer: Self-pay | Admitting: Family Medicine

## 2013-03-29 ENCOUNTER — Other Ambulatory Visit: Payer: Self-pay | Admitting: *Deleted

## 2013-03-29 ENCOUNTER — Other Ambulatory Visit: Payer: Self-pay | Admitting: Family Medicine

## 2013-03-29 MED ORDER — FLUTICASONE-SALMETEROL 250-50 MCG/DOSE IN AEPB: 1.0000 | INHALATION_SPRAY | Freq: Two times a day (BID) | RESPIRATORY_TRACT | Status: DC

## 2013-03-29 NOTE — Telephone Encounter (Signed)
rx faxed to [facility] By Crist [facility]anteia. Thanks. Lorenda Hatchet.Evaleen Sant, Renato Battleshekla

## 2013-03-29 NOTE — Telephone Encounter (Signed)
Please let patient know that Rx for advair is available at front desk. Patient requested refill but medicine will not e-prescribe since patient's preferred pharmacy is listed as the health department.   Thank you!  Marena ChancyStephanie Karsten Vaughn, PGY-3 Family Medicine Resident

## 2013-03-29 NOTE — Telephone Encounter (Signed)
Called pt. LMVM to call back. please see message. Thanks. [name].[name], [name]

## 2013-04-01 ENCOUNTER — Encounter: Payer: Self-pay | Admitting: Family Medicine

## 2013-04-01 ENCOUNTER — Ambulatory Visit (INDEPENDENT_AMBULATORY_CARE_PROVIDER_SITE_OTHER): Payer: No Typology Code available for payment source | Admitting: Family Medicine

## 2013-04-01 VITALS — BP 160/82 | HR 96 | Temp 97.7°F | Ht 62.25 in | Wt 167.0 lb

## 2013-04-01 DIAGNOSIS — IMO0002 Reserved for concepts with insufficient information to code with codable children: Secondary | ICD-10-CM

## 2013-04-01 DIAGNOSIS — E119 Type 2 diabetes mellitus without complications: Secondary | ICD-10-CM

## 2013-04-01 DIAGNOSIS — I7389 Other specified peripheral vascular diseases: Secondary | ICD-10-CM

## 2013-04-01 DIAGNOSIS — M1710 Unilateral primary osteoarthritis, unspecified knee: Secondary | ICD-10-CM

## 2013-04-01 LAB — POCT GLYCOSYLATED HEMOGLOBIN (HGB A1C): HEMOGLOBIN A1C: 7.5

## 2013-04-01 MED ORDER — HYDROCODONE-ACETAMINOPHEN 5-325 MG PO TABS: 1.0000 | ORAL_TABLET | Freq: Four times a day (QID) | ORAL | Status: DC | PRN

## 2013-04-01 NOTE — Patient Instructions (Signed)
Please follow up in 1 month for possible injection.

## 2013-04-02 NOTE — Progress Notes (Signed)
Patient ID: [name]    DOB: October 02, 1955, 58 y.o.   MRN: [id number] --- Subjective:  [name] is a 58 y.o.female who presents for follow up on leg and knee pain.  - knee pain: pain in left knee. Difficulty bending and going up and down stairs. Pain located on the medial aspect of knee. Difficult to keep knee completely straight or completely bent. Denies any locking. Does feel popping at times. Had steroid shot on 01/14/13 and she states she had 3 days of relief. Has been taking 2 vicodin at a time to help with pain.   - right calf pain: pain a little worst than before. Walking makes it worst. Pain is located in calf area. She has not been able to get lyrica because none of her doctor's offices accepts medicine to be shipped to their addresses from the [facility] program. Takes vicodin 5/325 tid prn  ROS: see HPI Past Medical History: reviewed and updated medications and allergies. Social History: Tobacco: none  Objective: Filed Vitals:   04/01/13 1056  BP: 160/82  Pulse: 96  Temp: 97.7 F (36.5 C)    Physical Examination:   General appearance - alert, well appearing, and in no distress Chest - clear to auscultation, no wheezes, rales or rhonchi, symmetric air entry Heart - normal rate, regular rhythm, normal S1, S2, no murmurs, rubs, clicks or gallops Left knee: tenderness along inferior medial aspect of patella, decreased extension and flexion due to pain, negative anterior drawer, no soft tissue swelling or joint effusion.  Right knee: mild discomfort with palpation of medial aspect.

## 2013-04-02 NOTE — Assessment & Plan Note (Signed)
Steroid injection provided very temporary relief: 3 days.  Could attempt other injection in March (3 months from previous injection).  Ideally would like patient to follow up with her orthopedist but she is unable to due to cost. Viscose supplementation would be a consideration, but without insurance patient will not be able to afford this.  NSAIDs not ideal due to h/o significant GERD.  Unable to use local gels or creams due to sensitive skin Will increase vicodin to 4 times a day

## 2013-04-02 NOTE — Assessment & Plan Note (Signed)
Still symptomatic. Likely neuropathic component but patient doesn't tolerate gabapentin and lyrica cannot be obtained by [facility] at this time.  - for now, will refill vicodin

## 2013-04-03 ENCOUNTER — Telehealth: Payer: Self-pay | Admitting: Family Medicine

## 2013-04-03 DIAGNOSIS — M25561 Pain in right knee: Secondary | ICD-10-CM

## 2013-04-03 NOTE — Telephone Encounter (Signed)
Called patient to touch base about her right knee pain, where she had a knee replacement done. Her left knee has mostly been bothering her, but she did mention some discomfort in the left. Thinking about it a little more, I think it would be important to get an xray to make sure there is no loosening of the prosthesis at the level of the tibia.  On exam, during visit, there was no warmth, no redness or swelling of the joint, making infection unlikely.  Patient expressed understanding and agreed with plan.  Marena ChancyStephanie Jaycee Pelzer, PGY-3 Family Medicine Resident

## 2013-04-04 ENCOUNTER — Ambulatory Visit
Admission: RE | Admit: 2013-04-04 | Discharge: 2013-04-04 | Disposition: A | Discharge status: Home / Self Care | Payer: No Typology Code available for payment source | Source: Ambulatory Visit | Attending: Family Medicine | Admitting: Family Medicine

## 2013-04-04 DIAGNOSIS — M25561 Pain in right knee: Secondary | ICD-10-CM

## 2013-04-10 ENCOUNTER — Telehealth: Payer: Self-pay | Admitting: Family Medicine

## 2013-04-10 MED ORDER — NORTRIPTYLINE HCL 25 MG PO CAPS: 25.0000 mg | ORAL_CAPSULE | Freq: Every day | ORAL | Status: DC

## 2013-04-10 NOTE — Telephone Encounter (Signed)
Called patient to let her know of the results of the right knee xray showing lucency around the most medial and lateral aspects f the tibial component. I recommended that she go back to see her orthopedic surgeon since she continues to have difficulty with that knee.  Also discussed a trial of nortryptiline to see if this would help with some of her pain.  Will start nortryptiline 25mg  qhs with possible up titration. Reviewed possible side effects and need to taper gradually upon discontinuation.  Patient expressed understanding and agreed with plan.   Marena ChancyStephanie Dorthy Hustead, PGY-3 Family Medicine Resident

## 2013-05-08 ENCOUNTER — Ambulatory Visit (INDEPENDENT_AMBULATORY_CARE_PROVIDER_SITE_OTHER): Payer: No Typology Code available for payment source | Admitting: Family Medicine

## 2013-05-08 ENCOUNTER — Encounter: Payer: Self-pay | Admitting: Family Medicine

## 2013-05-08 VITALS — BP 104/65 | HR 98 | Ht 62.25 in | Wt 169.5 lb

## 2013-05-08 DIAGNOSIS — IMO0002 Reserved for concepts with insufficient information to code with codable children: Secondary | ICD-10-CM

## 2013-05-08 DIAGNOSIS — M1710 Unilateral primary osteoarthritis, unspecified knee: Secondary | ICD-10-CM

## 2013-05-08 DIAGNOSIS — E119 Type 2 diabetes mellitus without complications: Secondary | ICD-10-CM

## 2013-05-08 MED ORDER — HYDROCODONE-ACETAMINOPHEN 5-325 MG PO TABS: 1.0000 | ORAL_TABLET | Freq: Four times a day (QID) | ORAL | Status: DC | PRN

## 2013-05-08 MED ORDER — METHYLPREDNISOLONE ACETATE 40 MG/ML IJ SUSP
40.0000 mg | Freq: Once | INTRAMUSCULAR | Status: AC
  Administered 2013-05-08: 40 mg via INTRA_ARTICULAR

## 2013-05-08 MED ORDER — ONDANSETRON HCL 4 MG PO TABS: 4.0000 mg | ORAL_TABLET | Freq: Three times a day (TID) | ORAL | Status: DC | PRN

## 2013-05-08 NOTE — Progress Notes (Signed)
Patient ID: [name]    DOB: 09/10/1955, 58 y.o.   MRN: [id number] --- Subjective:  [name] is a 58 y.o.female who presents for followup on bilateral knee pain. Pain is located on the medial aspect of each knee, better with rest. She has difficulty going up and down stairs. There have been days where she doesn't walk or do her regular exercise secondary to the pain. She qualifies her pain as a 9/10. It is worse at night time. Pain is constant. She reports stiffness, occasional locking of the left knee, popping. No recent trauma She takes Vicodin 4 times a day which helps. She tried nortriptyline 25 mg at nighttime but was feeling too drowsy in the morning and stopped taking it. X-ray of her right knee which underwent total knee replacement a couple years ago showed mild lucency around the medial and lateral aspects of the tibial component. She has not been able to followup with orthopedics secondary to finances.  ROS: see HPI Past Medical History: reviewed and updated medications and allergies. Social History: Tobacco: Former smoker  Objective: Filed Vitals:   05/08/13 0853  BP: 104/65  Pulse: 98    Physical Examination:   General appearance - alert, well appearing, and in no distress Knee Left: Minimal joint effusion along medial aspect, no warmth, no erythema Tenderness to palpation along the medial aspect of the joints. Normal extension. Limited flexion. Crepitus present with movement. Negative Lockman, McMurray's secondary is difficult to do secondary to pain. Knee right: Scar from knee replacement present, tenderness to palpation along the medial and lateral aspects of the joint, or soft tissue swelling present, normal extension, difficult flexion. Negative Lockman and McMurray's.   Knee injection. Consent obtained and timeout performed. Patient seated in a comfortable position with legs hanging off the table.  The medial Peri-patellar tendon space was palpated and marked. The  skin was then cleaned with Betadine. Cold spray applied. A 25-gauge inch and a half needle was used to inject 1ml of depomedrol 40mg  and 4ml of lidocaine 1% without epinephrine.  Patient tolerated procedure well no bleeding. Pain improved following injection

## 2013-05-08 NOTE — Assessment & Plan Note (Signed)
Steroid injection today of left knee. Nortriptyline unfortunately did not work. Continue Vicodin 4 times a day both for arthritis of the knee and claudication. Recommended that patient see her orthopedist especially in the setting of pain in her right knee which underwent replacement a few years ago. She unfortunately does not have the funds to see him at this time.

## 2013-05-08 NOTE — Patient Instructions (Signed)
Keep off your feet for the next 24 hours. You can ice the area. Followup in one to 2 months.

## 2013-05-08 NOTE — Addendum Note (Signed)
Addended byArlyss Repress: Jestin Burbach on: 05/08/2013 09:48 AM   Modules accepted: Orders

## 2013-05-27 ENCOUNTER — Telehealth: Payer: Self-pay | Admitting: Family Medicine

## 2013-05-27 ENCOUNTER — Telehealth: Payer: Self-pay | Admitting: *Deleted

## 2013-05-27 DIAGNOSIS — M25562 Pain in left knee: Principal | ICD-10-CM

## 2013-05-27 DIAGNOSIS — M25561 Pain in right knee: Secondary | ICD-10-CM

## 2013-05-27 MED ORDER — OMEGA-3-ACID ETHYL ESTERS 1 G PO CAPS: 2.0000 g | ORAL_CAPSULE | Freq: Two times a day (BID) | ORAL | Status: DC

## 2013-05-27 NOTE — Telephone Encounter (Signed)
Pt notified.  [name], CMA  

## 2013-05-27 NOTE — Telephone Encounter (Signed)
Please let patient know that she can pick up a prescription from the front desk for lovaza to bring to the [facility].   Thank you!  Leslie ChancyStephanie Gabrielly Mccrystal, PGY-3 Family Medicine Resident

## 2013-05-27 NOTE — Telephone Encounter (Signed)
Pt is requesting a referral to see a ortho doctor.jw

## 2013-05-27 NOTE — Telephone Encounter (Signed)
Pt called back to let us know that she did get her medicaid ID number.jw

## 2013-05-27 NOTE — Telephone Encounter (Signed)
Pt is waiting on her Medicaid card, which should be here by tomorrow, per patient.  Pt would like to see Dr. Ciro BackerGiofree, [facility] who she has seen before in the past for her knee.  I instructed patient to call us ASAP with her Medicaid info, referral can not be completed without insurance information.  Pt verbalized understanding.  I will send this message to PCP to request a referral be placed for patient.  Will notify referral coordinator as well.   Radene OuKristen L Lynsey Ange, CMA

## 2013-05-27 NOTE — Telephone Encounter (Signed)
Received refill request for Lovaza 1 GM from [facility].  Tried to reorder medication, but said unable to reorder, need to Rx.  Clovis Puamika L Martin, RN

## 2013-05-28 NOTE — Telephone Encounter (Signed)
Entered referral for ortho with Dr. Ciro BackerGiofree.  Thank you!  Marena ChancyStephanie Afra Tricarico, PGY-3 Family Medicine Resident

## 2013-06-05 NOTE — Telephone Encounter (Signed)
Pt has OC it will take some month.   Marines

## 2013-07-01 ENCOUNTER — Ambulatory Visit (INDEPENDENT_AMBULATORY_CARE_PROVIDER_SITE_OTHER): Payer: No Typology Code available for payment source | Admitting: Family Medicine

## 2013-07-01 ENCOUNTER — Encounter: Payer: Self-pay | Admitting: Family Medicine

## 2013-07-01 VITALS — BP 113/73 | HR 94 | Temp 97.7°F | Ht 62.0 in | Wt 169.8 lb

## 2013-07-01 DIAGNOSIS — E119 Type 2 diabetes mellitus without complications: Secondary | ICD-10-CM

## 2013-07-01 DIAGNOSIS — IMO0002 Reserved for concepts with insufficient information to code with codable children: Secondary | ICD-10-CM

## 2013-07-01 DIAGNOSIS — M1710 Unilateral primary osteoarthritis, unspecified knee: Secondary | ICD-10-CM

## 2013-07-01 LAB — POCT GLYCOSYLATED HEMOGLOBIN (HGB A1C): Hemoglobin A1C: 8

## 2013-07-01 MED ORDER — LISINOPRIL 10 MG PO TABS: 10.0000 mg | ORAL_TABLET | Freq: Every day | ORAL | Status: DC

## 2013-07-01 MED ORDER — HYDROCODONE-ACETAMINOPHEN 5-325 MG PO TABS: 1.0000 | ORAL_TABLET | Freq: Four times a day (QID) | ORAL | Status: DC | PRN

## 2013-07-01 MED ORDER — CILOSTAZOL 100 MG PO TABS: 100.0000 mg | ORAL_TABLET | Freq: Two times a day (BID) | ORAL | Status: DC

## 2013-07-01 MED ORDER — METFORMIN HCL 1000 MG PO TABS: 1000.0000 mg | ORAL_TABLET | Freq: Two times a day (BID) | ORAL | Status: DC

## 2013-07-01 MED ORDER — ONDANSETRON HCL 4 MG PO TABS: 4.0000 mg | ORAL_TABLET | Freq: Three times a day (TID) | ORAL | Status: DC | PRN

## 2013-07-01 NOTE — Patient Instructions (Signed)
Please follow up in 1 month.   I will call you with the results of the A1C. We very well will need to start you on an extra medicine for it.

## 2013-07-01 NOTE — Progress Notes (Signed)
Patient ID: [name]    DOB: 09-Jan-1956, 58 y.o.   MRN: [id number] --- Subjective:  [name] is a 58 y.o.female who presents for follow up on bilateral knee pain.  - knee pain has become slightly worst in the last few weeks. Pain is worst on left than right. Right knee underwent total hip replacement and started to bother her in the last couple of months. She has had associated cramping in her upper legs bilaterally. Her right calf continues to bother her from her PAD. She stopped walking. She is no longer cooking or doing much house cleaning secondary to pain.   - DM2: taking metformin 1000mg  bid. Not exercising as much as before. No missed doses of medicine. No hypoglycemic episodes. No foot ulcers. Thinks that she has been eating more fatty foods lately.    ROS: see HPI Past Medical History: reviewed and updated medications and allergies. Social History: Tobacco: former smoker  Objective: Filed Vitals:   07/01/13 1113  BP: 113/73  Pulse: 94  Temp: 97.7 F (36.5 C)    Physical Examination:   General appearance - alert, well appearing, and in no distress Mouth - mucous membranes moist, pharynx normal without lesions Neck - supple, no significant adenopathy Chest - clear to auscultation, no wheezes, rales or rhonchi, symmetric air entry Heart - normal rate, regular rhythm, normal S1, S2, no murmurs left knee: tenderness to palpation along medial and lateral joint line, decreased range of motion. Right knee: tenderness along the medial aspect, decreased range of motion Lower extr: no edema, normal DP pulses bilaterally, normal sensation to light touch

## 2013-07-04 MED ORDER — GLIMEPIRIDE 1 MG PO TABS: 1.0000 mg | ORAL_TABLET | Freq: Every day | ORAL | Status: DC

## 2013-07-04 NOTE — Assessment & Plan Note (Addendum)
A1C of 8.0 today. This is increased from 7.5 at last check.  Will add amaryl to metformin. Patient had an allergic reaction with glipizide where she developed a rash. She assures me that she had no difficulty breathing and no obstruction of her airway.  She is in agreement to try amaryl. Instructed her to seek immediate medical care if she were to develop shortness of breath or lip or tongue swelling, although I suspect that this will be unlikely especially since patient's prior reaction to glipizide was a rash.  - start amaryl 1mg  daily.

## 2013-07-04 NOTE — Assessment & Plan Note (Signed)
Patient needs to see ortho but doesn't have insurance. Referral placed at previous visit.  - refilled vicodin.  - too early for steroid injection

## 2013-08-05 ENCOUNTER — Telehealth: Payer: Self-pay | Admitting: Family Medicine

## 2013-08-05 DIAGNOSIS — E119 Type 2 diabetes mellitus without complications: Secondary | ICD-10-CM

## 2013-08-05 NOTE — Telephone Encounter (Signed)
Patient requesting refill on hr pain medication, her last office visit was on May 18 and she was told to follow up in 1 month. I do not see an upcoming appointment scheduled. Forward request to PCP.Busick, Rodena Medinobert Lee

## 2013-08-05 NOTE — Telephone Encounter (Signed)
Pt called and needs a refill on her pain medication jw °

## 2013-08-06 ENCOUNTER — Ambulatory Visit: Payer: No Typology Code available for payment source | Admitting: Cardiovascular Disease

## 2013-08-06 MED ORDER — HYDROCODONE-ACETAMINOPHEN 5-325 MG PO TABS: 1.0000 | ORAL_TABLET | Freq: Four times a day (QID) | ORAL | Status: DC | PRN

## 2013-08-06 NOTE — Telephone Encounter (Signed)
Called and informed patient that her Rx is ready to pick up and that she will need to schedule an appointment with her new MD before any further refills.Willer Osorno, Rodena Medinobert Lee

## 2013-08-06 NOTE — Telephone Encounter (Signed)
Please let patient know that she can come and pick up her prescription today. She will need to meet with her new PCP prior to anymore refills.  Thank you!  Marena ChancyStephanie Losq, PGY-3 Family Medicine Resident

## 2013-08-06 NOTE — Telephone Encounter (Signed)
Pt wanted the note sent again. She runs out of her pain meds today

## 2013-08-13 ENCOUNTER — Other Ambulatory Visit: Payer: Self-pay | Admitting: *Deleted

## 2013-08-13 MED ORDER — OMEGA-3-ACID ETHYL ESTERS 1 G PO CAPS: 2.0000 g | ORAL_CAPSULE | Freq: Two times a day (BID) | ORAL | Status: DC

## 2013-08-15 ENCOUNTER — Other Ambulatory Visit: Payer: Self-pay | Admitting: *Deleted

## 2013-08-15 ENCOUNTER — Other Ambulatory Visit: Payer: Self-pay

## 2013-08-15 MED ORDER — BISOPROLOL-HYDROCHLOROTHIAZIDE 2.5-6.25 MG PO TABS: 1.0000 | ORAL_TABLET | Freq: Every day | ORAL | Status: DC

## 2013-08-15 MED ORDER — OMEGA-3-ACID ETHYL ESTERS 1 G PO CAPS: 2.0000 g | ORAL_CAPSULE | Freq: Two times a day (BID) | ORAL | Status: DC

## 2013-08-19 ENCOUNTER — Other Ambulatory Visit: Payer: Self-pay | Admitting: *Deleted

## 2013-08-19 ENCOUNTER — Encounter: Payer: Self-pay | Admitting: Family Medicine

## 2013-08-19 ENCOUNTER — Ambulatory Visit (INDEPENDENT_AMBULATORY_CARE_PROVIDER_SITE_OTHER): Payer: No Typology Code available for payment source | Admitting: Family Medicine

## 2013-08-19 VITALS — BP 136/62 | HR 96 | Temp 97.9°F | Ht 62.75 in | Wt 173.4 lb

## 2013-08-19 DIAGNOSIS — M1710 Unilateral primary osteoarthritis, unspecified knee: Secondary | ICD-10-CM

## 2013-08-19 DIAGNOSIS — M1990 Unspecified osteoarthritis, unspecified site: Secondary | ICD-10-CM

## 2013-08-19 DIAGNOSIS — I7389 Other specified peripheral vascular diseases: Secondary | ICD-10-CM

## 2013-08-19 DIAGNOSIS — IMO0002 Reserved for concepts with insufficient information to code with codable children: Secondary | ICD-10-CM

## 2013-08-19 DIAGNOSIS — M129 Arthropathy, unspecified: Secondary | ICD-10-CM

## 2013-08-19 DIAGNOSIS — E119 Type 2 diabetes mellitus without complications: Secondary | ICD-10-CM

## 2013-08-19 DIAGNOSIS — K861 Other chronic pancreatitis: Secondary | ICD-10-CM

## 2013-08-19 MED ORDER — OMEGA-3-ACID ETHYL ESTERS 1 G PO CAPS: 2.0000 g | ORAL_CAPSULE | Freq: Two times a day (BID) | ORAL | Status: DC

## 2013-08-19 MED ORDER — GLIMEPIRIDE 2 MG PO TABS: 2.0000 mg | ORAL_TABLET | Freq: Every day | ORAL | Status: DC

## 2013-08-19 MED ORDER — HYDROCODONE-ACETAMINOPHEN 5-325 MG PO TABS: 1.0000 | ORAL_TABLET | Freq: Four times a day (QID) | ORAL | Status: DC | PRN

## 2013-08-19 NOTE — Assessment & Plan Note (Signed)
Pt needs ortho but cost prohibits - refilled vicodin - consider injection, pt resistant but worth re-addressing

## 2013-08-19 NOTE — Assessment & Plan Note (Signed)
Still symptomatic, denies any improvement with pletal - would consider discontinuing this, with limited mobility it seems unlikely she is getting any benefit

## 2013-08-19 NOTE — Assessment & Plan Note (Signed)
Chronic abdominal pain, likely related to gall-stone or alcoholic pancreatitis, pt refusing further imaging  - pt on chronic norco for knees and abdomen, continue these

## 2013-08-19 NOTE — Progress Notes (Signed)
   Subjective:    Patient ID: [name], female    DOB: 09-28-1955, 58 y.o.   MRN: [id number]  HPI Pt presents for f/u of knee pain. Has significant osteoarthritis, s/p R knee replacement. No longer followed by ortho due to cost. Managed with chronic pain meds for this, as well as claudication and chronic pancreatitis.   Review of Systems  Respiratory: Negative for cough and shortness of breath.   Cardiovascular: Negative for chest pain.  Gastrointestinal: Positive for nausea, vomiting and abdominal pain. Negative for blood in stool.  Musculoskeletal: Positive for arthralgias.       Objective:   Physical Exam  Nursing note and vitals reviewed. Constitutional: She is oriented to person, place, and time. She appears well-developed and well-nourished. No distress.  HENT:  Head: Normocephalic and atraumatic.  Eyes: Conjunctivae are normal. Right eye exhibits no discharge. Left eye exhibits no discharge. No scleral icterus.  Cardiovascular: Normal rate.   Pulmonary/Chest: Effort normal.  Abdominal: Soft. She exhibits no distension.  Musculoskeletal: She exhibits no edema and no tenderness.  Neurological: She is alert and oriented to person, place, and time.  Skin: Skin is warm and dry. She is not diaphoretic.  Psychiatric: She has a normal mood and affect. Her behavior is normal.          Assessment & Plan:

## 2013-08-19 NOTE — Assessment & Plan Note (Signed)
A1c not repeated today, 8.0 in May, tolerating amaryl well - continue metformin - increase amaryl to 2mg  daily - rtc in 2 months

## 2013-08-19 NOTE — Patient Instructions (Signed)
I have refilled your pain medicine for your pancreatitis and arthritis.  For your diabetes I am increasing your new medicine, amaryl to 2 mg.  Please come back in 2 months.

## 2013-08-27 ENCOUNTER — Other Ambulatory Visit: Payer: Self-pay | Admitting: Cardiology

## 2013-09-02 ENCOUNTER — Telehealth: Payer: Self-pay | Admitting: Family Medicine

## 2013-09-02 NOTE — Telephone Encounter (Signed)
Pt called because [facility] her Lovaza. Can we send the request again. jw

## 2013-09-05 ENCOUNTER — Ambulatory Visit: Payer: No Typology Code available for payment source

## 2013-09-06 ENCOUNTER — Other Ambulatory Visit: Payer: Self-pay | Admitting: Family Medicine

## 2013-09-06 MED ORDER — OMEGA-3-ACID ETHYL ESTERS 1 G PO CAPS: 2.0000 g | ORAL_CAPSULE | Freq: Two times a day (BID) | ORAL | Status: DC

## 2013-09-12 ENCOUNTER — Other Ambulatory Visit (HOSPITAL_COMMUNITY): Payer: Self-pay | Admitting: Cardiology

## 2013-09-12 DIAGNOSIS — I739 Peripheral vascular disease, unspecified: Secondary | ICD-10-CM

## 2013-09-13 ENCOUNTER — Other Ambulatory Visit: Payer: Self-pay

## 2013-09-13 MED ORDER — ISOSORBIDE MONONITRATE ER 60 MG PO TB24: 90.0000 mg | ORAL_TABLET | Freq: Every day | ORAL | Status: DC

## 2013-09-17 ENCOUNTER — Ambulatory Visit (INDEPENDENT_AMBULATORY_CARE_PROVIDER_SITE_OTHER): Payer: No Typology Code available for payment source | Admitting: Cardiovascular Disease

## 2013-09-17 ENCOUNTER — Ambulatory Visit (HOSPITAL_COMMUNITY): Payer: No Typology Code available for payment source | Attending: Cardiology | Admitting: Cardiology

## 2013-09-17 VITALS — BP 139/82 | HR 86 | Ht 63.0 in | Wt 186.0 lb

## 2013-09-17 DIAGNOSIS — I7389 Other specified peripheral vascular diseases: Secondary | ICD-10-CM

## 2013-09-17 DIAGNOSIS — I2510 Atherosclerotic heart disease of native coronary artery without angina pectoris: Secondary | ICD-10-CM | POA: Insufficient documentation

## 2013-09-17 DIAGNOSIS — I10 Essential (primary) hypertension: Secondary | ICD-10-CM | POA: Insufficient documentation

## 2013-09-17 DIAGNOSIS — Z7901 Long term (current) use of anticoagulants: Secondary | ICD-10-CM | POA: Insufficient documentation

## 2013-09-17 DIAGNOSIS — E119 Type 2 diabetes mellitus without complications: Secondary | ICD-10-CM | POA: Insufficient documentation

## 2013-09-17 DIAGNOSIS — E785 Hyperlipidemia, unspecified: Secondary | ICD-10-CM

## 2013-09-17 DIAGNOSIS — I70219 Atherosclerosis of native arteries of extremities with intermittent claudication, unspecified extremity: Secondary | ICD-10-CM | POA: Insufficient documentation

## 2013-09-17 DIAGNOSIS — I739 Peripheral vascular disease, unspecified: Secondary | ICD-10-CM

## 2013-09-17 NOTE — Assessment & Plan Note (Addendum)
She continues to have bilateral calf claudication worse on the right side. Symptoms seem to be out of proportion to findings of angiogram as well as noninvasive testing. ABI today was 0.87 on the right side and normal on the left side. Duplex showed evidence of proximal right SFA stenosis but peak velocity was less than 300. I recommend continuing medical therapy. She reports no improvement in symptoms with Pletal and she has been having hard time getting this medication. Thus, this was stopped. Continue treatment with aspirin and Plavix. I advised her to continue a walking program and followup in one year.

## 2013-09-17 NOTE — Progress Notes (Signed)
ABI and unilateral lower arterial duplex performed  

## 2013-09-17 NOTE — Patient Instructions (Addendum)
Your physician has recommended you make the following change in your medication:  1. Stop Pletal  Your physician wants you to follow-up in: 1 year with Dr. Katheren PullerArida You will receive a reminder letter in the mail two months in advance. If you don't receive a letter, please call our office to schedule the follow-up appointment.

## 2013-09-17 NOTE — Progress Notes (Signed)
HPI  This is 58 yo WF who is here for a follow up visit regarding peripheral arterial disease. She has known history of peripheral arterial disease status post atherectomy of the right SFA in 2011 by Dr. Sanjuana KavaMcAlhaney. She has other chronic medical conditions including CAD, DM, HTN, and hyperlipidemia.  Doppler showed > 50% stenosis in the right proximal SFA. ABIs showed fall from 0.95 to 0.65 on the right with exercise. She is a former smoker. She had significant discomfort in the right calf  with minimal walking less than 50 feet in spite of being on optimal medications. Thus, I decided to proceed with angiography in 12/2011 which showed moderate diffuse disease in R SFA (at most 50%). Three vessel run below the knee but with sluggish flow distally. No revascularization was performed. She continues to complain of right lower extremity discomfort both at rest and with walking. She had a previous right knee replacement. The calf discomfort is worse on the right side and happens with walking about 100 feet. She had repeat ABI today which was 0.87 on the right and 1.1 on the left.  Allergies  Allergen Reactions  . Glucotrol [Glipizide] Rash    Red rash  . Influenza Vaccines Other (See Comments)    Significant arm soreness requiring 1 year of physical therapy  . Latex Rash  . Milk-Related Compounds Rash    Fever  . Tetanus Toxoid Hives, Swelling and Rash    Swelling to site of injection with rash and fever     Current Outpatient Prescriptions on File Prior to Visit  Medication Sig Dispense Refill  . albuterol (PROVENTIL HFA;VENTOLIN HFA) 108 (90 BASE) MCG/ACT inhaler Inhale 2 puffs into the lungs 2 (two) times daily as needed. For shortness of breath  3 Inhaler  3  . aspirin 81 MG tablet Take 81 mg by mouth daily.        . bisoprolol-hydrochlorothiazide (ZIAC) 2.5-6.25 MG per tablet Take 1 tablet by mouth daily.  30 tablet  0  . esomeprazole (NEXIUM) 20 MG capsule Take 20 mg by mouth daily at  12 noon.      . fish oil-omega-3 fatty acids 1000 MG capsule Take 2 capsules (2 g total) by mouth 2 (two) times daily.  360 capsule  3  . Fluticasone-Salmeterol (ADVAIR DISKUS) 250-50 MCG/DOSE AEPB Inhale 1 puff into the lungs every 12 (twelve) hours.  180 each  3  . HYDROcodone-acetaminophen (NORCO/VICODIN) 5-325 MG per tablet Take 1 tablet by mouth 4 (four) times daily as needed. Do not fill until 09/05/13.  120 tablet  0  . isosorbide mononitrate (IMDUR) 60 MG 24 hr tablet Take 1.5 tablets (90 mg total) by mouth daily.  45 tablet  3  . lisinopril (PRINIVIL,ZESTRIL) 10 MG tablet Take 1 tablet (10 mg total) by mouth daily.  90 tablet  4  . metFORMIN (GLUCOPHAGE) 1000 MG tablet Take 1 tablet (1,000 mg total) by mouth 2 (two) times daily with a meal.  60 tablet  6  . Multiple Vitamin (MULTIVITAMIN) tablet Take 1 tablet by mouth daily.        . ondansetron (ZOFRAN) 4 MG tablet Take 1 tablet (4 mg total) by mouth every 8 (eight) hours as needed for nausea or vomiting.  30 tablet  0  . rosuvastatin (CRESTOR) 40 MG tablet Take 40 mg by mouth at bedtime.        No current facility-administered medications on file prior to visit.  Past Medical History  Diagnosis Date  . CAD (coronary artery disease)     ETT-myoview (12/10) with apical anterior ischemia, EF 75%.  LHC (1/11) showed diffuse disease..   There was a large D3 with serial 70%, 80-90%, and 70% stenoses then severe diffuse distal vessel disease.   Small to moderate OM1 with severe diffuse disease, 50% mRCA, 95% mid small PLV, 60% PDA.  EF 60%.  Patient managed medically, no interventional options.   . Other and unspecified hyperlipidemia 401.9    250.00  . Tobacco use disorder     quit 11/10  . Osteoarthritis     severe right knee  R TKR  . Asthma with COPD   . PAD (peripheral artery disease)     Severe stenosis mid right SFA s/p atherectomy 03/25/09. Moderate disease diffusely throughout the  iliacs, bilateral SFA, below knee vessels.   ABIs post-procedure were 0.85 on right and 0.99 on left.   . Pancreatitis 2003     Past Surgical History  Procedure Laterality Date  . Joint replacement  2011    right knee replacement   . Tubal ligation  1980     Family History  Problem Relation Age of Onset  . Emphysema Father      History   Social History  . Marital Status: Married    Spouse Name: N/A    Number of Children: N/A  . Years of Education: 10   Occupational History  . Not on file.   Social History Main Topics  . Smoking status: Former Smoker    Quit date: 12/13/2008  . Smokeless tobacco: Not on file  . Alcohol Use: No  . Drug Use: No  . Sexual Activity: Not on file   Other Topics Concern  . Not on file   Social History Narrative   Denies any IV drug use or marijuana use.  Former smoker for 30 years, quit Nov. 2010. She is married with 2 children. Disabled from knee, not working.     PHYSICAL EXAM   BP 139/82  Pulse 86  Ht 5\' 3"  (1.6 m)  Wt 186 lb (84.369 kg)  BMI 32.96 kg/m2 Constitutional: She is oriented to person, place, and time. She appears well-developed and well-nourished. No distress.  HENT: No nasal discharge.  Head: Normocephalic and atraumatic.  Eyes: Pupils are equal and round. Right eye exhibits no discharge. Left eye exhibits no discharge.  Neck: Normal range of motion. Neck supple. No JVD present. No thyromegaly present.  Cardiovascular: Normal rate, regular rhythm, normal heart sounds. Exam reveals no gallop and no friction rub. No murmur heard.  Pulmonary/Chest: Effort normal and breath sounds normal. No stridor. No respiratory distress. She has no wheezes. She has no rales. She exhibits no tenderness.  Abdominal: Soft. Bowel sounds are normal. She exhibits no distension. There is no tenderness. There is no rebound and no guarding.  Musculoskeletal: Normal range of motion. She exhibits no edema and no tenderness.  Neurological: She is alert and oriented to person, place,  and time. Coordination normal.  Skin: Skin is warm and dry. No rash noted. She is not diaphoretic. No erythema. No pallor.  Psychiatric: She has a normal mood and affect. Her behavior is normal. Judgment and thought content normal.  Vascular: Femoral pulses are normal. Distal pulses are diminished on the left side and not palpable on the right side.  EKG: Normal sinus rhythm with low voltage.  ASSESSMENT AND PLAN

## 2013-10-11 ENCOUNTER — Telehealth: Payer: Self-pay | Admitting: *Deleted

## 2013-10-11 MED ORDER — ROSUVASTATIN CALCIUM 40 MG PO TABS: 40.0000 mg | ORAL_TABLET | Freq: Every day | ORAL | Status: DC

## 2013-10-11 NOTE — Telephone Encounter (Signed)
Patient stated that [facility] needs a new rx for the crestor faxed to [phone], so that she can continue to receive this from them at no cost. Thanks, MI

## 2013-10-11 NOTE — Telephone Encounter (Signed)
Mailed renewal application [facility] for crestor to pt. Dr Shirlee Latch has completed the physician section of the form.  Faxed new prescription for crestor 40 mg daily to [phone] at pt's request.

## 2013-10-14 ENCOUNTER — Other Ambulatory Visit: Payer: Self-pay

## 2013-10-14 MED ORDER — BISOPROLOL-HYDROCHLOROTHIAZIDE 2.5-6.25 MG PO TABS: 1.0000 | ORAL_TABLET | Freq: Every day | ORAL | Status: DC

## 2013-10-16 ENCOUNTER — Other Ambulatory Visit: Payer: Self-pay

## 2013-10-16 MED ORDER — BISOPROLOL-HYDROCHLOROTHIAZIDE 2.5-6.25 MG PO TABS: 1.0000 | ORAL_TABLET | Freq: Every day | ORAL | Status: DC

## 2013-10-25 ENCOUNTER — Other Ambulatory Visit: Payer: Self-pay

## 2013-10-25 MED ORDER — ROSUVASTATIN CALCIUM 40 MG PO TABS: 40.0000 mg | ORAL_TABLET | Freq: Every day | ORAL | Status: DC

## 2013-10-25 NOTE — Telephone Encounter (Signed)
Got DR [name] Fu to sign a rx for this patient to send to [facility], I also will be faxing this in

## 2013-10-28 ENCOUNTER — Telehealth: Payer: Self-pay | Admitting: *Deleted

## 2013-10-28 NOTE — Telephone Encounter (Signed)
Patient came to the office for crestor samples. Samples provided.

## 2013-10-28 NOTE — Telephone Encounter (Signed)
A rx was faxed to [facility] for crestor, The faxed # is [phone]

## 2013-11-05 ENCOUNTER — Encounter: Payer: Self-pay | Admitting: Family Medicine

## 2013-11-05 ENCOUNTER — Ambulatory Visit (INDEPENDENT_AMBULATORY_CARE_PROVIDER_SITE_OTHER): Payer: No Typology Code available for payment source | Admitting: Family Medicine

## 2013-11-05 VITALS — BP 136/84 | HR 86 | Temp 98.0°F | Wt 178.0 lb

## 2013-11-05 DIAGNOSIS — M1710 Unilateral primary osteoarthritis, unspecified knee: Secondary | ICD-10-CM

## 2013-11-05 DIAGNOSIS — E119 Type 2 diabetes mellitus without complications: Secondary | ICD-10-CM

## 2013-11-05 DIAGNOSIS — K861 Other chronic pancreatitis: Secondary | ICD-10-CM

## 2013-11-05 DIAGNOSIS — IMO0002 Reserved for concepts with insufficient information to code with codable children: Secondary | ICD-10-CM

## 2013-11-05 LAB — POCT GLYCOSYLATED HEMOGLOBIN (HGB A1C): Hemoglobin A1C: 7.7

## 2013-11-05 MED ORDER — ONDANSETRON HCL 4 MG PO TABS: 4.0000 mg | ORAL_TABLET | Freq: Three times a day (TID) | ORAL | Status: DC | PRN

## 2013-11-05 MED ORDER — HYDROCODONE-ACETAMINOPHEN 5-325 MG PO TABS: 1.0000 | ORAL_TABLET | Freq: Four times a day (QID) | ORAL | Status: DC | PRN

## 2013-11-05 MED ORDER — GLIMEPIRIDE 2 MG PO TABS: 2.0000 mg | ORAL_TABLET | Freq: Every day | ORAL | Status: DC

## 2013-11-05 MED ORDER — NORTRIPTYLINE HCL 25 MG PO CAPS: 25.0000 mg | ORAL_CAPSULE | Freq: Every day | ORAL | Status: DC

## 2013-11-05 NOTE — Assessment & Plan Note (Signed)
S/p R knee replacement, pain and "nerve damage" per ortho, cannot afford to replace/revise\ - refilled vicodin - trial of nortriptyline to help with nerve pain and sleep

## 2013-11-05 NOTE — Progress Notes (Signed)
   Subjective:    Patient ID: [name], female    DOB: 1955-06-26, 58 y.o.   MRN: [id number]  Diabetes Pertinent negatives for hypoglycemia include no dizziness or headaches. Pertinent negatives for diabetes include no chest pain, no polydipsia, no polyphagia, no polyuria and no weakness.   Pt presents for f/u of knee pain and diabetes.  Has had OA in both knee for many years. Rt replaced in '11 with some improvement following but nerve pain distal to this. She is seen by [facility] for this and has been told she needs surgery for both knees but cannot afford it.  Her diabetes control has been relatively good recently. She denies any symptoms and reports CBGs of 120-140 at 9am after a piece of toast.    Review of Systems  Constitutional: Negative for fever and chills.  Eyes: Negative for visual disturbance.  Respiratory: Negative for shortness of breath.   Cardiovascular: Negative for chest pain.  Gastrointestinal: Positive for nausea and abdominal pain. Negative for constipation and blood in stool.  Endocrine: Negative for polydipsia, polyphagia and polyuria.  Genitourinary: Negative for difficulty urinating.  Musculoskeletal: Positive for arthralgias and joint swelling.  Skin: Negative for rash.  Neurological: Positive for numbness. Negative for dizziness, weakness, light-headedness and headaches.  Psychiatric/Behavioral: Positive for sleep disturbance.       Objective:   Physical Exam  Nursing note and vitals reviewed. Constitutional: She is oriented to person, place, and time. She appears well-developed and well-nourished. No distress.  HENT:  Head: Normocephalic and atraumatic.  Eyes: Conjunctivae are normal. Right eye exhibits no discharge. Left eye exhibits no discharge. No scleral icterus.  Cardiovascular: Normal rate, regular rhythm, normal heart sounds and intact distal pulses.   No murmur heard. Pulmonary/Chest: Effort normal and breath sounds normal.  No respiratory distress. She has no wheezes.  Abdominal: She exhibits no distension.  Musculoskeletal:       Right knee: She exhibits decreased range of motion and swelling. She exhibits no ecchymosis, no deformity, no erythema, normal alignment, no LCL laxity, normal meniscus and no MCL laxity. No tenderness found.       Left knee: She exhibits decreased range of motion and swelling. She exhibits no deformity, no erythema, normal alignment, no LCL laxity, normal patellar mobility, no bony tenderness, normal meniscus and no MCL laxity. No tenderness found.  DM foot exam normal  Neurological: She is alert and oriented to person, place, and time.  Skin: Skin is warm and dry. No rash noted. She is not diaphoretic.  Psychiatric: She has a normal mood and affect. Her behavior is normal.          Assessment & Plan:

## 2013-11-05 NOTE — Assessment & Plan Note (Signed)
Slight improvement to 7.7 - continue metformin and amaryl  - discuss diet and exercise, patient is confident she can make changes as she has done it before and only recently fell off the wagon - f/u in 3 months and consider further med adjustment if not at goal then

## 2013-11-05 NOTE — Patient Instructions (Signed)
For your pain, I am refilling your vicodin and adding another medication that should help with the pain as well as with your sleep.   For your diabetes, we will continue your current medications and work on increasing your physical activity and improving your diet, especially limiting carbohydrates and getting more vegetables.  Please come back and see me in 3 months to check on these issues.

## 2013-11-05 NOTE — Assessment & Plan Note (Signed)
Refilled zofran for chronic nausea

## 2013-11-07 ENCOUNTER — Other Ambulatory Visit: Payer: Self-pay | Admitting: Family Medicine

## 2013-11-07 ENCOUNTER — Other Ambulatory Visit: Payer: Self-pay | Admitting: *Deleted

## 2013-11-07 MED ORDER — ESOMEPRAZOLE MAGNESIUM 20 MG PO CPDR: 20.0000 mg | DELAYED_RELEASE_CAPSULE | Freq: Every day | ORAL | Status: DC

## 2013-11-07 NOTE — Progress Notes (Signed)
Pt informed. Clarrissa Shimkus CMA 

## 2013-11-11 ENCOUNTER — Other Ambulatory Visit: Payer: Self-pay | Admitting: *Deleted

## 2013-11-12 MED ORDER — ESOMEPRAZOLE MAGNESIUM 20 MG PO CPDR: 20.0000 mg | DELAYED_RELEASE_CAPSULE | Freq: Every day | ORAL | Status: DC

## 2013-11-12 MED ORDER — ALBUTEROL SULFATE HFA 108 (90 BASE) MCG/ACT IN AERS: 2.0000 | INHALATION_SPRAY | Freq: Two times a day (BID) | RESPIRATORY_TRACT | Status: DC | PRN

## 2013-11-21 ENCOUNTER — Telehealth: Payer: Self-pay | Admitting: Family Medicine

## 2013-11-21 NOTE — Telephone Encounter (Signed)
The new medicine she put me on for the pain in her legs made her feel bad so she slopped taking it Just thought she should let dr know

## 2013-11-28 ENCOUNTER — Other Ambulatory Visit: Payer: Self-pay | Admitting: Family Medicine

## 2013-12-02 ENCOUNTER — Telehealth: Payer: Self-pay | Admitting: Family Medicine

## 2013-12-02 NOTE — Telephone Encounter (Signed)
Pt calling for her refill on hydrocodone

## 2013-12-04 NOTE — Telephone Encounter (Signed)
Patient calls again today, also called yesterday. Will be completely out of meds by tomorrow. Pls advise.

## 2013-12-06 ENCOUNTER — Other Ambulatory Visit: Payer: Self-pay | Admitting: Family Medicine

## 2013-12-06 DIAGNOSIS — K861 Other chronic pancreatitis: Secondary | ICD-10-CM

## 2013-12-06 MED ORDER — HYDROCODONE-ACETAMINOPHEN 5-325 MG PO TABS: 1.0000 | ORAL_TABLET | Freq: Four times a day (QID) | ORAL | Status: DC | PRN

## 2013-12-06 NOTE — Telephone Encounter (Signed)
Patient informed. 

## 2013-12-06 NOTE — Telephone Encounter (Signed)
Refill at front desk, please inform patient. 

## 2013-12-30 ENCOUNTER — Encounter: Payer: Self-pay | Admitting: *Deleted

## 2014-01-02 ENCOUNTER — Encounter: Payer: Self-pay | Admitting: Cardiology

## 2014-01-02 ENCOUNTER — Ambulatory Visit (INDEPENDENT_AMBULATORY_CARE_PROVIDER_SITE_OTHER): Payer: No Typology Code available for payment source | Admitting: Cardiology

## 2014-01-02 VITALS — BP 126/80 | HR 88 | Ht 63.0 in | Wt 178.0 lb

## 2014-01-02 DIAGNOSIS — E785 Hyperlipidemia, unspecified: Secondary | ICD-10-CM

## 2014-01-02 DIAGNOSIS — I739 Peripheral vascular disease, unspecified: Secondary | ICD-10-CM

## 2014-01-02 DIAGNOSIS — I2510 Atherosclerotic heart disease of native coronary artery without angina pectoris: Secondary | ICD-10-CM

## 2014-01-02 LAB — BASIC METABOLIC PANEL
BUN: 23 mg/dL (ref 6–23)
CO2: 23 meq/L (ref 19–32)
CREATININE: 1.1 mg/dL (ref 0.4–1.2)
Calcium: 9.7 mg/dL (ref 8.4–10.5)
Chloride: 103 mEq/L (ref 96–112)
GFR: 57.46 mL/min — AB (ref >60.00)
GLUCOSE: 148 mg/dL — AB (ref 70–99)
Potassium: 4.1 mEq/L (ref 3.5–5.1)
SODIUM: 137 meq/L (ref 135–145)

## 2014-01-02 LAB — HEPATIC FUNCTION PANEL
ALBUMIN: 4.1 g/dL (ref 3.5–5.2)
ALK PHOS: 68 U/L (ref 39–117)
ALT: 34 U/L (ref 0–35)
AST: 23 U/L (ref 0–37)
Bilirubin, Direct: 0 mg/dL (ref 0.0–0.3)
Total Bilirubin: 0.4 mg/dL (ref 0.2–1.2)
Total Protein: 6.7 g/dL (ref 6.0–8.3)

## 2014-01-02 LAB — LIPID PANEL
Cholesterol: 130 mg/dL (ref 0–200)
HDL: 39.5 mg/dL (ref >39.00)
NonHDL: 90.5
Total CHOL/HDL Ratio: 3
Triglycerides: 303 mg/dL — ABNORMAL HIGH (ref 0.0–149.0)
VLDL: 60.6 mg/dL — AB (ref 0.0–40.0)

## 2014-01-02 LAB — LDL CHOLESTEROL, DIRECT: Direct LDL: 50.6 mg/dL

## 2014-01-02 NOTE — Progress Notes (Signed)
Patient ID: [name], female   DOB: [birth year], 58 y.o.   MRN: [id number] PCP: Dr. Richarda BladeAdamo  58 yo WF with h/o PVD, CAD,  DM, HTN, and hyperlipidemia is here for followup. Steffanie DunnLexiscan myoview was done in 6/13 and showed no ischemia or infarction.  In 10/13, she had lower extremity arterial dopplers done.  This showed > 50% stenosis in the right proximal SFA.  ABIs showed fall from 0.95 to 0.65 on the right with exercise. She saw Dr. Kirke CorinArida and had peripheral angiography in 11/13 with only about 50% diffuse right SFA stenosis.  Her leg pain was out of proportion to the degree of PAD.  She was on cilostazol without much effect, so it was stopped. She continues to have a lot of pain in both legs. It seems to be more due to the knees than the muscles.  She says that she needs revision of her right TKR and possibly an operation on her left knee but is waiting to get insurance before doing anything.  Her knees and legs hurt with any walking.    No exertional chest pain.  Occasional sharp, atypical chest pain with stress.  She also does not get exertional dyspnea.  She really is limited primarily by her leg/knee pain.   Labs (10/11): LFTs normal Labs (4/12): LDL 73, HDL 54, triglycerides 340 Labs (9/12): TGs 335, HDL 47, LDL 55 Labs (6/13): HDL 55, LDL 55, TGs 274 Labs (10/13): K 4.5, creatinine 1.24 Labs (6/14): LDL 36, HDL 47 Labs (11/14): K 4.7, creatinine 1.11  Allergies (verified):  1)  ! * Tetanus 2)  ! * Latex 3)  ! * Milk  Past History:  Past Medical History: 1. CAD: ETT-myoview (12/10) with apical anterior ischemia, EF 75%.  LHC (1/11) showed diffuse disease.  There was a large D3 with serial 70%, 80-90%, and 70% stenoses then severe diffuse distal vessel disease.  Small to moderate OM1 with severe diffuse disease, 50% mRCA, 95% mid small PLV, 60% PDA.  EF 60%.  Patient managed medically, no interventional options.  Lexiscan myoview (6/13) with EF 74%, no ischemia or infarction.  2.  DYSLIPIDEMIA  3. HYPERTENSION  4. DIABETES MELLITUS, TYPE II 5. TOBACCO ABUSE : Quit 11/10.  6. Severe osteoarthritis right knee, now s/p R TKR.  7. Asthma/COPD 8. PAD: Severe stenosis mid right SFA s/p atherectomy 03/25/09. Moderate disease diffusely throughout the iliacs, bilateral SFA, below knee vessels.  ABIs post-procedure were 0.85 on right and 0.99 on left.  Repeat ABIs (8/11): 0.88 right, 1.2 left.  ABIs (3/12): 0.85 right, 0.99 left.  ABIs (4/13): 0.81 right, 0.98 left. Peripheral arterial dopplers (10/13) with > 50% proximal right SFA stenosis.  ABIs on right worsened from 0.95 => 0.65 with exercise.  Peripheral angiography (11/13): 50% diffuse right SFA stenosis. ABIs (6/14): 0.88 right, 0.96 left. ABIs (8/15) 0.87 right, 1.1 left. Cilostazol did not help.  9. Chronic pancreatitis   Family History: Mother alive with CVA Father deceased with emphysema  Social History: No etoh-used to drink. Denies any IV drug use or marijuana use.   Former smoker for 30 years, quit November 2010. She is married with two children. Disabled from knee, not working.     Current Outpatient Prescriptions  Medication Sig Dispense Refill  . albuterol (PROVENTIL HFA;VENTOLIN HFA) 108 (90 BASE) MCG/ACT inhaler Inhale 2 puffs into the lungs 2 (two) times daily as needed for wheezing or shortness of breath. 3 Inhaler 3  . aspirin 81 MG tablet Take  81 mg by mouth daily.      . bisoprolol-hydrochlorothiazide (ZIAC) 2.5-6.25 MG per tablet Take 1 tablet by mouth daily. 30 tablet 3  . clopidogrel (PLAVIX) 75 MG tablet Take 75 mg by mouth daily.    Marland Kitchen. esomeprazole (NEXIUM) 20 MG capsule Take 1 capsule (20 mg total) by mouth daily at 12 noon. 60 capsule 11  . fish oil-omega-3 fatty acids 1000 MG capsule Take 2 capsules (2 g total) by mouth 2 (two) times daily. 360 capsule 3  . Fluticasone-Salmeterol (ADVAIR DISKUS) 250-50 MCG/DOSE AEPB Inhale 1 puff into the lungs every 12 (twelve) hours. 180 each 3  .  glimepiride (AMARYL) 2 MG tablet Take 1 tablet (2 mg total) by mouth daily with breakfast. 30 tablet 11  . HYDROcodone-acetaminophen (NORCO/VICODIN) 5-325 MG per tablet Take 1 tablet by mouth 4 (four) times daily as needed. Do not fill until 01/06/14 120 tablet 0  . isosorbide mononitrate (IMDUR) 60 MG 24 hr tablet Take 1.5 tablets (90 mg total) by mouth daily. 45 tablet 3  . lisinopril (PRINIVIL,ZESTRIL) 10 MG tablet Take 1 tablet (10 mg total) by mouth daily. 90 tablet 4  . metFORMIN (GLUCOPHAGE) 1000 MG tablet Take 1 tablet (1,000 mg total) by mouth 2 (two) times daily with a meal. 60 tablet 6  . Multiple Vitamin (MULTIVITAMIN) tablet Take 1 tablet by mouth daily.      . nortriptyline (PAMELOR) 25 MG capsule Take 1 capsule (25 mg total) by mouth at bedtime. 30 capsule 3  . ondansetron (ZOFRAN) 4 MG tablet Take 1 tablet (4 mg total) by mouth every 8 (eight) hours as needed for nausea or vomiting. 30 tablet 0  . rosuvastatin (CRESTOR) 40 MG tablet Take 1 tablet (40 mg total) by mouth at bedtime. 90 tablet 3   No current facility-administered medications for this visit.    BP 126/80 mmHg  Pulse 88  Ht 5\' 3"  (1.6 m)  Wt 178 lb (80.74 kg)  BMI 31.54 kg/m2  SpO2 97% General: NAD Neck: No JVD, no thyromegaly or thyroid nodule.  Lungs: Clear to auscultation bilaterally with normal respiratory effort. CV: Nondisplaced PMI.  Heart regular S1/S2, no S3/S4, no murmur.  No peripheral edema.  No carotid bruit.  2+ PT on left, unable to feel pulses on right.  Abdomen: Soft, nontender, no hepatosplenomegaly, no distention.  Neurologic: Alert and oriented x 3.  Psych: Normal affect. Extremities: No clubbing or cyanosis.   Assessment/Plan:  CAD  Occasional atypical chest pain. She had diffuse branch vessel disease on 2011 cath not amenable to PCI. Lexiscan myoview in 6/13 showed no ischemia or infarction. Continue ASA 81, bisoprolol, lisinopril, Imdur, Plavix, and statin. DYSLIPIDEMIA  Continue  statin, check lipids today. Goal LDL < 70.  PAD Ongoing bilateral leg pain with any ambulation.  Cilostazol did not help much and was stopped.  Peripheral angiography showed 50% right SFA stenosis, but pain seemed out of proportion to PAD.  Pain may be more due to knee arthritis and peripheral neuropathy than PAD.    Marca AnconaDalton Mohanad Carsten 01/02/2014

## 2014-01-02 NOTE — Patient Instructions (Addendum)
Your physician recommends that you have  lab work today-Lipid profile/liver/BMET  Your physician wants you to follow-up in: 6 months with Dr Shirlee LatchMcLean. (May 2016)You will receive a reminder letter in the mail two months in advance. If you don't receive a letter, please call our office to schedule the follow-up appointment.

## 2014-01-03 ENCOUNTER — Other Ambulatory Visit: Payer: Self-pay | Admitting: Family Medicine

## 2014-01-03 DIAGNOSIS — Z1231 Encounter for screening mammogram for malignant neoplasm of breast: Secondary | ICD-10-CM

## 2014-01-07 ENCOUNTER — Telehealth: Payer: Self-pay | Admitting: *Deleted

## 2014-01-07 DIAGNOSIS — E119 Type 2 diabetes mellitus without complications: Secondary | ICD-10-CM

## 2014-01-07 MED ORDER — METFORMIN HCL 1000 MG PO TABS: 1000.0000 mg | ORAL_TABLET | Freq: Two times a day (BID) | ORAL | Status: DC

## 2014-01-07 NOTE — Telephone Encounter (Signed)
Patient also needs refill of metformin 1000 mg.  Will need ICD-10 code entered in Epic for diabetes before med can be reordered.  Request routed to PCP to refill meds and place ICD-10 code in Epic.  Altamese Dilling~Jayonna Meyering, BSN, RN-BC

## 2014-01-07 NOTE — Telephone Encounter (Signed)
Refill placed and linked to ICD10 dx  Thanks!

## 2014-01-13 ENCOUNTER — Ambulatory Visit: Payer: No Typology Code available for payment source

## 2014-01-15 ENCOUNTER — Ambulatory Visit (HOSPITAL_COMMUNITY)
Admission: RE | Admit: 2014-01-15 | Discharge: 2014-01-15 | Disposition: A | Discharge status: Home / Self Care | Payer: Self-pay | Source: Ambulatory Visit | Attending: Family Medicine | Admitting: Family Medicine

## 2014-01-15 DIAGNOSIS — Z1231 Encounter for screening mammogram for malignant neoplasm of breast: Secondary | ICD-10-CM

## 2014-01-16 ENCOUNTER — Other Ambulatory Visit: Payer: Self-pay

## 2014-01-16 MED ORDER — ISOSORBIDE MONONITRATE ER 60 MG PO TB24: 60.0000 mg | ORAL_TABLET | Freq: Every day | ORAL | Status: DC

## 2014-01-16 MED ORDER — BISOPROLOL-HYDROCHLOROTHIAZIDE 2.5-6.25 MG PO TABS: 1.0000 | ORAL_TABLET | Freq: Every day | ORAL | Status: DC

## 2014-01-23 ENCOUNTER — Other Ambulatory Visit: Payer: Self-pay | Admitting: Family Medicine

## 2014-01-23 ENCOUNTER — Encounter (HOSPITAL_COMMUNITY): Payer: Self-pay | Admitting: Cardiovascular Disease

## 2014-01-23 DIAGNOSIS — K861 Other chronic pancreatitis: Secondary | ICD-10-CM

## 2014-01-23 DIAGNOSIS — E119 Type 2 diabetes mellitus without complications: Secondary | ICD-10-CM

## 2014-01-23 NOTE — Telephone Encounter (Signed)
Pt called and would like to speak to one of the nurses for Dr. Richarda BladeAdamo concerning some medication. jw

## 2014-01-24 NOTE — Telephone Encounter (Signed)
[facility] and will be transferring care to another office, she states she needs a few extra refills on the following medications until she can find another provider: metformin, zofran, imdur, also she states she will out of her pain medication on 12/23. Will forward to PCP.

## 2014-01-27 MED ORDER — METFORMIN HCL 1000 MG PO TABS: 1000.0000 mg | ORAL_TABLET | Freq: Two times a day (BID) | ORAL | Status: DC

## 2014-01-27 MED ORDER — ISOSORBIDE MONONITRATE ER 60 MG PO TB24: 60.0000 mg | ORAL_TABLET | Freq: Every day | ORAL | Status: DC

## 2014-01-27 MED ORDER — ONDANSETRON HCL 4 MG PO TABS: 4.0000 mg | ORAL_TABLET | Freq: Three times a day (TID) | ORAL | Status: DC | PRN

## 2014-01-27 NOTE — Telephone Encounter (Signed)
I sent in refills for her other meds but she will need an appointment for pain meds. Thanks

## 2014-01-28 NOTE — Telephone Encounter (Signed)
Gave patient message from PCP, patient states that with new Obamacare insurance she would have to pay co-pay plus more to be seen and cant afford to come in. Is asking that MD refill her pain medication one last time, I informed her that our policy is that she be seen but I would send message to PCP.

## 2014-01-28 NOTE — Telephone Encounter (Signed)
Patient scheduled an appt on 12/30 at 10:00.  Need refill on hydrocodone before then.  Medication will be completely out on the 23rd of this month.  Just need to have enough until appt.

## 2014-02-03 MED ORDER — HYDROCODONE-ACETAMINOPHEN 5-325 MG PO TABS: 1.0000 | ORAL_TABLET | Freq: Four times a day (QID) | ORAL | Status: DC | PRN

## 2014-02-03 NOTE — Telephone Encounter (Signed)
Refill at front desk for pick up, please inform patient

## 2014-02-04 NOTE — Telephone Encounter (Signed)
Pt informed. Deseree Blount, CMA  

## 2014-02-12 ENCOUNTER — Ambulatory Visit: Payer: Self-pay | Admitting: Family Medicine

## 2014-02-19 ENCOUNTER — Other Ambulatory Visit: Payer: Self-pay | Admitting: Cardiology

## 2014-03-03 ENCOUNTER — Ambulatory Visit (INDEPENDENT_AMBULATORY_CARE_PROVIDER_SITE_OTHER): Payer: 59 | Admitting: Family Medicine

## 2014-03-03 ENCOUNTER — Encounter: Payer: Self-pay | Admitting: Family Medicine

## 2014-03-03 VITALS — BP 159/83 | HR 91 | Temp 97.8°F | Ht 63.0 in | Wt 180.0 lb

## 2014-03-03 DIAGNOSIS — M129 Arthropathy, unspecified: Secondary | ICD-10-CM

## 2014-03-03 DIAGNOSIS — E119 Type 2 diabetes mellitus without complications: Secondary | ICD-10-CM

## 2014-03-03 DIAGNOSIS — M1710 Unilateral primary osteoarthritis, unspecified knee: Secondary | ICD-10-CM

## 2014-03-03 DIAGNOSIS — K861 Other chronic pancreatitis: Secondary | ICD-10-CM

## 2014-03-03 LAB — POCT GLYCOSYLATED HEMOGLOBIN (HGB A1C): HEMOGLOBIN A1C: 7.3

## 2014-03-03 MED ORDER — GLIMEPIRIDE 4 MG PO TABS: 4.0000 mg | ORAL_TABLET | Freq: Every day | ORAL | Status: DC

## 2014-03-03 MED ORDER — OXYCODONE-ACETAMINOPHEN 7.5-325 MG PO TABS: 1.0000 | ORAL_TABLET | ORAL | Status: DC | PRN

## 2014-03-03 NOTE — Patient Instructions (Signed)
I hope that these stronger pain medicines help control your abdominal pain.  I am increasing your glimeperide to 4mg  at lunch to help improve your blood sugar control.  We will call you when we have set up the ortho appointment with Dr. Darrelyn HillockGioffre.

## 2014-03-04 NOTE — Assessment & Plan Note (Signed)
Acute flair of chronic pancreatitis, frequent vomiting and postprandial pain for the past 2 weeks. does not want to go to hospital, tolerating fluids - will give short course of percocet to help her avoid hospitalization, continue phenergan - if not able to stay hydrated she will need to come in, patient voiced understanding/agreement

## 2014-03-04 NOTE — Assessment & Plan Note (Signed)
Some improvement to 7.3 - continue metformin, increase amaryl to 4mg  and move to lunch (barely eats breakfast) - f/u in 3 months

## 2014-03-04 NOTE — Assessment & Plan Note (Signed)
Severe bilateral pain, s/p R knee replacement, has seen Gioffre in the past, now has insurance and would like to see him again - ortho referral, gioffre

## 2014-03-04 NOTE — Progress Notes (Signed)
   Subjective:    Patient ID: [name], female    DOB: 04/01/1955, 59 y.o.   MRN: [id number]  HPI Pt presents for DM f/u and abdominal pain  CHRONIC DIABETES  Disease Monitoring  Blood Sugar Ranges: 120-150s  Polyuria: no   Visual problems: no   Medication Compliance: yes  Medication Side Effects  Hypoglycemia: yes, symptomatic at 100-120, never gets true lows   Preventitive Health Care  Eye Exam: due  Foot Exam: 11/05/13  Diet pattern: eats lunch and dinner, forces down a cracker for breakfast to take her meds  Exercise: minimal due to knee pain  Patient also complains of severe epigastric pain for the past 2 weeks as well as vomiting. Presentation is similar to past pancreatitis flairs but she does not want to go to hospital.     Review of Systems  Constitutional: Positive for diaphoresis and appetite change. Negative for fever.  Respiratory: Negative for shortness of breath.   Cardiovascular: Negative for chest pain.  Gastrointestinal: Positive for nausea, vomiting and abdominal pain. Negative for diarrhea, constipation and blood in stool.  All other systems reviewed and are negative.      Objective:   Physical Exam  Constitutional: She is oriented to person, place, and time. She appears well-developed and well-nourished. No distress.  HENT:  Head: Normocephalic and atraumatic.  Eyes: Conjunctivae are normal. Right eye exhibits no discharge. Left eye exhibits no discharge. No scleral icterus.  Cardiovascular: Normal rate, regular rhythm, normal heart sounds and intact distal pulses.   No murmur heard. Pulmonary/Chest: Effort normal. No respiratory distress.  Abdominal: Soft. Bowel sounds are normal. She exhibits no distension and no mass. There is tenderness. There is no rebound and no guarding.  Musculoskeletal: She exhibits edema and tenderness.  Bilateral knee pain with some swelling and tenderness at joint line  Neurological: She is alert and oriented to  person, place, and time.  Skin: Skin is warm and dry. No rash noted. She is not diaphoretic.  Psychiatric: She has a normal mood and affect. Her behavior is normal.  Nursing note and vitals reviewed.         Assessment & Plan:

## 2014-03-14 ENCOUNTER — Other Ambulatory Visit: Payer: Self-pay | Admitting: Family Medicine

## 2014-03-14 ENCOUNTER — Telehealth: Payer: Self-pay | Admitting: Family Medicine

## 2014-03-14 DIAGNOSIS — K861 Other chronic pancreatitis: Secondary | ICD-10-CM

## 2014-03-14 MED ORDER — HYDROCODONE-ACETAMINOPHEN 5-325 MG PO TABS: 1.0000 | ORAL_TABLET | Freq: Four times a day (QID) | ORAL | Status: DC | PRN

## 2014-03-14 NOTE — Telephone Encounter (Signed)
Auth was obtained back on 1/20 for this referral. Auth #[id number]

## 2014-03-14 NOTE — Telephone Encounter (Signed)
Spoke with patient and gave her below message 

## 2014-03-14 NOTE — Telephone Encounter (Signed)
Patient needing refill on her hydrocodone.  Please call when ready to pick up

## 2014-03-14 NOTE — Telephone Encounter (Signed)
[name] office is calling for a compass referral for the patient to be seen on 03/18/14, She is seeing Dr. Ranee Gosselinonald Gioffre diagnose code [phone] . Please call [phone] to let them know . jw

## 2014-03-18 ENCOUNTER — Other Ambulatory Visit (HOSPITAL_COMMUNITY): Payer: Self-pay | Admitting: Orthopedic Surgery

## 2014-03-18 DIAGNOSIS — M25561 Pain in right knee: Secondary | ICD-10-CM

## 2014-03-19 ENCOUNTER — Telehealth: Payer: Self-pay | Admitting: Cardiology

## 2014-03-19 DIAGNOSIS — Z01818 Encounter for other preprocedural examination: Secondary | ICD-10-CM

## 2014-03-19 DIAGNOSIS — I739 Peripheral vascular disease, unspecified: Secondary | ICD-10-CM

## 2014-03-19 DIAGNOSIS — I2510 Atherosclerotic heart disease of native coronary artery without angina pectoris: Secondary | ICD-10-CM

## 2014-03-19 NOTE — Telephone Encounter (Signed)
Will route to covering nurse to schedule

## 2014-03-19 NOTE — Telephone Encounter (Signed)
With known CAD and very limited exercise tolerance from PAD, would recommend pre-op Lexiscan Cardiolite.

## 2014-03-19 NOTE — Telephone Encounter (Signed)
New Message  Pt calling to speak w/ Rn about possible stress test for future surgery. Please call back and discuss.

## 2014-03-19 NOTE — Telephone Encounter (Signed)
Spoke with pt in regards to stress test. Pt states that the last time she was in the office Dr. Shirlee LatchMcLean spoke with her briefly about doing a stress test prior to surgery if she decided to have surgery. Pt calling to inform Dr. Shirlee LatchMcLean that she has started the process to have knee surgery.  Surgery is not scheduled at this time. Pt wanted to know if Dr. Shirlee LatchMcLean would still like to do stress test? Informed pt that I would forward this message to Dr. Shirlee LatchMcLean and his nurse, Dewayne HatchAnn for review, advisement and follow up.

## 2014-03-20 ENCOUNTER — Encounter: Payer: Self-pay | Admitting: *Deleted

## 2014-03-20 NOTE — Telephone Encounter (Signed)
Discussed with patient.   [facility] to schedule Lexiscan Cardiolite.

## 2014-03-24 ENCOUNTER — Ambulatory Visit (HOSPITAL_COMMUNITY): Payer: 59 | Attending: Cardiovascular Disease | Admitting: Radiology

## 2014-03-24 DIAGNOSIS — M129 Arthropathy, unspecified: Secondary | ICD-10-CM | POA: Insufficient documentation

## 2014-03-24 DIAGNOSIS — Z01818 Encounter for other preprocedural examination: Secondary | ICD-10-CM | POA: Diagnosis present

## 2014-03-24 DIAGNOSIS — I739 Peripheral vascular disease, unspecified: Secondary | ICD-10-CM | POA: Diagnosis not present

## 2014-03-24 DIAGNOSIS — I2510 Atherosclerotic heart disease of native coronary artery without angina pectoris: Secondary | ICD-10-CM | POA: Diagnosis not present

## 2014-03-24 MED ORDER — TECHNETIUM TC 99M SESTAMIBI GENERIC - CARDIOLITE
10.0000 | Freq: Once | INTRAVENOUS | Status: AC | PRN
  Administered 2014-03-24: 10 via INTRAVENOUS

## 2014-03-24 MED ORDER — TECHNETIUM TC 99M SESTAMIBI GENERIC - CARDIOLITE
30.0000 | Freq: Once | INTRAVENOUS | Status: AC | PRN
  Administered 2014-03-24: 30 via INTRAVENOUS

## 2014-03-24 MED ORDER — REGADENOSON 0.4 MG/5ML IV SOLN
0.4000 mg | Freq: Once | INTRAVENOUS | Status: AC
  Administered 2014-03-24: 0.4 mg via INTRAVENOUS

## 2014-03-24 NOTE — Progress Notes (Addendum)
[facility] 3 NUCLEAR MED [address] [place], [place] [address] [phone]    Cardiology Nuclear Med Study  [name] is a 59 y.o. female     MRN : [id number]     DOB: 1955/11/24  Procedure Date: 03/24/2014  Nuclear Med Background Indication for Stress Test:  Evaluation for Ischemia, Follow-up CAD, and Pending Surgical Clearance for  (L) Knee Surgery by Ranee Gosselin, MD History:  CAD (diffuse - med. treatment), MPI 2014 (normal) EF 74%, COPD Cardiac Risk Factors: Hypertension, NIDDM and PVD  Symptoms:  DOE   Nuclear Pre-Procedure Caffeine/Decaff Intake:  None> 12 hrs NPO After: 10:00pm   Lungs:  clear O2 Sat: 98% on room air. IV 0.9% NS with Angio Cath:  22g  IV Site: R Antecubital x 1, tolerated well IV Started by:  Irean Hong, RN  Chest Size (in):  44 Cup Size: C  Height:  (1.6 m)  Weight:  176 lb (79.833 kg)  BMI:  Body mass index is 31.18 kg/(m^2). Tech Comments:  Patient took Ziac, and held Metformin, and Glimepiride this am. Irean Hong, RN.    Nuclear Med Study 1 or 2 day study: 1 day  Stress Test Type:  Eugenie Birks  Reading MD: N/A  Order Authorizing Provider:  Marca Ancona, MD  Resting Radionuclide: Technetium 60m Sestamibi  Resting Radionuclide Dose: 11.0 mCi   Stress Radionuclide:  Technetium 42m Sestamibi  Stress Radionuclide Dose: 33.0 mCi           Stress Protocol Rest HR: 85 Stress HR: 99  Rest BP: 121/78 Stress BP: 124/61  Exercise Time (min): n/a METS: n/a   Predicted Max HR: 164 bpm % Max HR: 60.37 bpm Rate Pressure Product: 21308   Dose of Adenosine (mg):  n/a Dose of Lexiscan: 0.4 mg  Dose of Atropine (mg): n/a Dose of Dobutamine: n/a mcg/kg/min (at max HR)  Stress Test Technologist: Nelson Chimes, BS-ES  Nuclear Technologist:  Kerby Nora, CNMT     Rest Procedure:  Myocardial perfusion imaging was performed at rest 45 minutes following the intravenous administration of Technetium 54m Sestamibi. Rest ECG:  Sinus rhythm, 85, nonspecific ST-T wave changes  Stress Procedure:  The patient received IV Lexiscan 0.4 mg over 15-seconds.  Technetium 85m Sestamibi injected at 30-seconds.  Quantitative spect images were obtained after a 45 minute delay.  During the infusion of Lexiscan the patient complained of feeling SOB, chest tightness, anxious and nausea.  These symptoms began to resolve in recovery.  Stress ECG: No significant change from baseline ECG  QPS Raw Data Images:  Normal; no motion artifact; normal heart/lung ratio. Stress Images:  There is decreased radiotracer uptake along the septal wall/apex slightly improved at stress versus rest. This may represent possible old infarct along the distal anteroseptal wall. Rest Images:  As described above Subtraction (SDS):  3 Transient Ischemic Dilatation (Normal <1.22):  1.42 Lung/Heart Ratio (Normal <0.45):  0.30  Quantitative Gated Spect Images QGS EDV:  67 ml QGS ESV:  22 ml  Impression Exercise Capacity:  Lexiscan with no exercise. BP Response:  Normal blood pressure response. Clinical Symptoms:  Shortness of breath, chest tightness, anxiety, nausea typical with Lexiscan ECG Impression:  No significant ST segment change suggestive of ischemia. Comparison with Prior Nuclear Study: No images to compare  Overall Impression:  Low risk stress nuclear study , Possible distal anteroseptal infarct pattern. No significant ischemia identified. Normal wall motion would on gated images would refute old infarct.Marland Kitchen  LV  Ejection Fraction: 67%.  LV Wall Motion:  NL LV Function; NL Wall Motion  Leslie Yera, MD  No ischemia. Low risk study.  May report.  [name] 03/25/2014 4:57 PM

## 2014-03-25 ENCOUNTER — Encounter (HOSPITAL_COMMUNITY): Payer: 59

## 2014-03-25 NOTE — Progress Notes (Signed)
Pt.notified

## 2014-03-27 ENCOUNTER — Ambulatory Visit (INDEPENDENT_AMBULATORY_CARE_PROVIDER_SITE_OTHER): Payer: 59 | Admitting: Family Medicine

## 2014-03-27 ENCOUNTER — Encounter: Payer: Self-pay | Admitting: Family Medicine

## 2014-03-27 VITALS — BP 104/62 | HR 78 | Temp 97.6°F | Resp 18 | Ht 63.0 in | Wt 180.0 lb

## 2014-03-27 DIAGNOSIS — J4540 Moderate persistent asthma, uncomplicated: Secondary | ICD-10-CM

## 2014-03-27 DIAGNOSIS — M25561 Pain in right knee: Secondary | ICD-10-CM | POA: Diagnosis not present

## 2014-03-27 DIAGNOSIS — E119 Type 2 diabetes mellitus without complications: Secondary | ICD-10-CM

## 2014-03-27 DIAGNOSIS — I2510 Atherosclerotic heart disease of native coronary artery without angina pectoris: Secondary | ICD-10-CM

## 2014-03-27 DIAGNOSIS — M25562 Pain in left knee: Secondary | ICD-10-CM

## 2014-03-27 DIAGNOSIS — E049 Nontoxic goiter, unspecified: Secondary | ICD-10-CM

## 2014-03-27 DIAGNOSIS — Z7689 Persons encountering health services in other specified circumstances: Secondary | ICD-10-CM

## 2014-03-27 DIAGNOSIS — Z7189 Other specified counseling: Secondary | ICD-10-CM | POA: Diagnosis not present

## 2014-03-27 LAB — LIPID PANEL
Cholesterol: 141 mg/dL (ref 0–200)
HDL: 46 mg/dL (ref >39)
LDL CALC: 45 mg/dL (ref 0–99)
Total CHOL/HDL Ratio: 3.1 Ratio
Triglycerides: 252 mg/dL — ABNORMAL HIGH (ref <150)
VLDL: 50 mg/dL — AB (ref 0–40)

## 2014-03-27 LAB — PULMONARY FUNCTION TEST

## 2014-03-27 LAB — CBC WITH DIFFERENTIAL/PLATELET
Basophils Absolute: 0.1 10*3/uL (ref 0.0–0.1)
Basophils Relative: 1 % (ref 0–1)
Eosinophils Absolute: 0.4 10*3/uL (ref 0.0–0.7)
Eosinophils Relative: 5 % (ref 0–5)
HCT: 39.1 % (ref 36.0–46.0)
Hemoglobin: 12.8 g/dL (ref 12.0–15.0)
LYMPHS ABS: 2.3 10*3/uL (ref 0.7–4.0)
Lymphocytes Relative: 33 % (ref 12–46)
MCH: 27.9 pg (ref 26.0–34.0)
MCHC: 32.7 g/dL (ref 30.0–36.0)
MCV: 85.4 fL (ref 78.0–100.0)
MONOS PCT: 8 % (ref 3–12)
MPV: 8.6 fL (ref 8.6–12.4)
Monocytes Absolute: 0.6 10*3/uL (ref 0.1–1.0)
NEUTROS PCT: 53 % (ref 43–77)
Neutro Abs: 3.8 10*3/uL (ref 1.7–7.7)
Platelets: 320 10*3/uL (ref 150–400)
RBC: 4.58 MIL/uL (ref 3.87–5.11)
RDW: 13.7 % (ref 11.5–15.5)
WBC: 7.1 10*3/uL (ref 4.0–10.5)

## 2014-03-27 LAB — COMPLETE METABOLIC PANEL WITH GFR
ALT: 22 U/L (ref 0–35)
AST: 18 U/L (ref 0–37)
Albumin: 4.5 g/dL (ref 3.5–5.2)
Alkaline Phosphatase: 69 U/L (ref 39–117)
BILIRUBIN TOTAL: 0.2 mg/dL (ref 0.2–1.2)
BUN: 21 mg/dL (ref 6–23)
CHLORIDE: 102 meq/L (ref 96–112)
CO2: 25 mEq/L (ref 19–32)
CREATININE: 0.99 mg/dL (ref 0.50–1.10)
Calcium: 10.6 mg/dL — ABNORMAL HIGH (ref 8.4–10.5)
GFR, Est African American: 74 mL/min
GFR, Est Non African American: 64 mL/min
Glucose, Bld: 109 mg/dL — ABNORMAL HIGH (ref 70–99)
Potassium: 4.7 mEq/L (ref 3.5–5.3)
Sodium: 140 mEq/L (ref 135–145)
Total Protein: 6.9 g/dL (ref 6.0–8.3)

## 2014-03-27 LAB — C-REACTIVE PROTEIN: CRP: <0.5 mg/dL (ref <0.60)

## 2014-03-27 LAB — HEMOGLOBIN A1C
HEMOGLOBIN A1C: 8.1 % — AB (ref <5.7)
MEAN PLASMA GLUCOSE: 186 mg/dL — AB (ref <117)

## 2014-03-27 LAB — TSH: TSH: 1.564 u[IU]/mL (ref 0.350–4.500)

## 2014-03-27 NOTE — Progress Notes (Signed)
Subjective:    Patient ID: [name], female    DOB: 08-26-1955, 59 y.o.   MRN: [id number]  HPI  Patient is here today to establish care. She has a very complicated past medical history including coronary artery disease and peripheral artery disease. She is currently managed medically. Patient specifically states that her arteries are not amenable to percutaneous coronary intervention. She is on aspirin and Plavix, Imdur, a statin, beta blocker, and ACE inhibitor. She recently had a stress test to clear her for her upcoming surgery and per the patient report her stress test was negative. She also has a chronic history of asthma that she has had since childhood. This is complicated by long-standing tobacco abuse. She states that she quit smoking several years ago. Her history states that she has COPD but she is adamant that it is asthma. She is currently managed with Advair twice a day and albuterol as needed. She uses albuterol 1-2 times per week. She also has long-standing history of episodes of pancreatitis. Patient states that she quit drinking approximately 20 years ago. She states that she was not a heavy drinker but that she did indulge in alcohol at that time. That is when the pancreatitis began. Ever since she has episodes of pancreatitis. The patient is not sure what triggers her pancreatitis. Patient was scheduled for a HIDA scan but that got canceled for some reason. At the present time she treats episodes of pancreatitis with bowel rest and pain medication. She also had a right knee replacement. She has chronic pain in both knees. Her orthopedist is scheduling her for surgery to evaluate her right knee replacement and also perform a left total knee replacement. She has never had a colonoscopy. She refuses a flu shot. She had Pneumovax 23 in 2011. She is due for Prevnar 13. She had a mammogram is up-to-date. Her Pap smear was performed last year. She states that she also has pain in her right  leg due to nerve damage. Past Medical History  Diagnosis Date  . CAD (coronary artery disease)     ETT-myoview (12/10) with apical anterior ischemia, EF 75%.  LHC (1/11) showed diffuse disease..   There was a large D3 with serial 70%, 80-90%, and 70% stenoses then severe diffuse distal vessel disease.   Small to moderate OM1 with severe diffuse disease, 50% mRCA, 95% mid small PLV, 60% PDA.  EF 60%.  Patient managed medically, no interventional options.   . Other and unspecified hyperlipidemia 401.9    250.00  . Tobacco use disorder     quit 11/10  . Osteoarthritis     severe right knee  R TKR  . Asthma with COPD   . PAD (peripheral artery disease)     Severe stenosis mid right SFA s/p atherectomy 03/25/09. Moderate disease diffusely throughout the  iliacs, bilateral SFA, below knee vessels.  ABIs post-procedure were 0.85 on right and 0.99 on left.   . Pancreatitis 2003  . Diabetes mellitus without complication    Past Surgical History  Procedure Laterality Date  . Joint replacement  2011    right knee replacement   . Tubal ligation  1980  . Abdominal aortagram N/A 12/21/2011    Procedure: ABDOMINAL Ronny Flurry;  Surgeon: Iran Ouch, MD;  Location: [facility] CATH LAB;  Service: Cardiovascular;  Laterality: N/A;   Current Outpatient Prescriptions on File Prior to Visit  Medication Sig Dispense Refill  . albuterol (PROVENTIL HFA;VENTOLIN HFA) 108 (90 BASE) MCG/ACT inhaler Inhale  2 puffs into the lungs 2 (two) times daily as needed for wheezing or shortness of breath. 3 Inhaler 3  . aspirin 81 MG tablet Take 81 mg by mouth daily.      . bisoprolol-hydrochlorothiazide (ZIAC) 2.5-6.25 MG per tablet Take 1 tablet by mouth daily. 90 tablet 1  . clopidogrel (PLAVIX) 75 MG tablet TAKE 1 TABLET BY MOUTH DAILY 30 tablet 6  . esomeprazole (NEXIUM) 20 MG capsule Take 1 capsule (20 mg total) by mouth daily at 12 noon. 60 capsule 11  . fish oil-omega-3 fatty acids 1000 MG capsule Take 2 capsules (2 g  total) by mouth 2 (two) times daily. 360 capsule 3  . Fluticasone-Salmeterol (ADVAIR DISKUS) 250-50 MCG/DOSE AEPB Inhale 1 puff into the lungs every 12 (twelve) hours. 180 each 3  . glimepiride (AMARYL) 4 MG tablet Take 1 tablet (4 mg total) by mouth daily with lunch. 90 tablet 4  . HYDROcodone-acetaminophen (NORCO/VICODIN) 5-325 MG per tablet Take 1 tablet by mouth 4 (four) times daily as needed. Do not fill until 01/06/14 120 tablet 0  . isosorbide mononitrate (IMDUR) 60 MG 24 hr tablet Take 1 tablet (60 mg total) by mouth daily. 90 tablet 3  . lisinopril (PRINIVIL,ZESTRIL) 10 MG tablet Take 1 tablet (10 mg total) by mouth daily. 90 tablet 4  . metFORMIN (GLUCOPHAGE) 1000 MG tablet Take 1 tablet (1,000 mg total) by mouth 2 (two) times daily with a meal. 60 tablet 11  . Multiple Vitamin (MULTIVITAMIN) tablet Take 1 tablet by mouth daily.      . ondansetron (ZOFRAN) 4 MG tablet Take 1 tablet (4 mg total) by mouth every 8 (eight) hours as needed for nausea or vomiting. 30 tablet 0  . rosuvastatin (CRESTOR) 40 MG tablet Take 1 tablet (40 mg total) by mouth at bedtime. 90 tablet 3   No current facility-administered medications on file prior to visit.   Allergies  Allergen Reactions  . Glucotrol [Glipizide] Rash    Red rash  . Influenza Vaccines Other (See Comments)    Significant arm soreness requiring 1 year of physical therapy  . Latex Rash  . Milk-Related Compounds Rash    Fever  . Tetanus Toxoid Hives, Swelling and Rash    Swelling to site of injection with rash and fever  . Gabapentin   . Nortriptyline    History   Social History  . Marital Status: Married    Spouse Name: N/A  . Number of Children: N/A  . Years of Education: 10   Occupational History  . Not on file.   Social History Main Topics  . Smoking status: Former Smoker    Quit date: 12/13/2008  . Smokeless tobacco: Not on file  . Alcohol Use: No  . Drug Use: No  . Sexual Activity: Not on file   Other Topics  Concern  . Not on file   Social History Narrative   Denies any IV drug use or marijuana use.  Former smoker for 30 years, quit Nov. 2010. She is married with 2 children. Disabled from knee, not working.   Family History  Problem Relation Age of Onset  . Emphysema Father   . CVA Mother      Review of Systems  All other systems reviewed and are negative.      Objective:   Physical Exam  Constitutional: She is oriented to person, place, and time. She appears well-developed and well-nourished. No distress.  HENT:  Right Ear: External ear normal.  Left Ear: External ear normal.  Nose: Nose normal.  Mouth/Throat: Oropharynx is clear and moist.  Eyes: Conjunctivae are normal.  Neck: Neck supple. No JVD present. Thyromegaly present.  Cardiovascular: Normal rate, regular rhythm and normal heart sounds.   No murmur heard. Pulmonary/Chest: Effort normal and breath sounds normal. She has no wheezes. She has no rales. She exhibits no tenderness.  Abdominal: Soft. Bowel sounds are normal. She exhibits no distension and no mass. There is no tenderness. There is no rebound and no guarding.  Musculoskeletal: She exhibits no edema.  Lymphadenopathy:    She has no cervical adenopathy.  Neurological: She is alert and oriented to person, place, and time. She has normal reflexes. She displays normal reflexes. No cranial nerve deficit. She exhibits normal muscle tone. Coordination normal.  Skin: No rash noted. She is not diaphoretic. No erythema.  Vitals reviewed.         Assessment & Plan:  Establishing care with new doctor, encounter for  Diabetes mellitus type II, controlled - Plan: CBC with Differential/Platelet, COMPLETE METABOLIC PANEL WITH GFR, Lipid panel, Hemoglobin A1c, Microalbumin, urine  Asthma, chronic, moderate persistent, uncomplicated  ASCVD (arteriosclerotic cardiovascular disease) - Plan: Lipid panel  Bilateral knee pain - Plan: Sedimentation rate, C-reactive  protein  Goiter - Plan: TSH, US Soft Tissue Head/Neck  Patient certainly has diabetes. I would like to check a CMP, fasting lipid panel, hemoglobin A1c, and urine microalbumin to evaluate the control of her diabetes. She is already on an aspirin due to her coronary artery disease. Given her asthma and history of smoking I will evaluate to see if the patient has COPD by performing pulmonary function test. She may benefit from a long-acting anticholinergic such as Spiriva if there is an element of COPD. I would like to check a fasting lipid panel because her goal LDL cholesterol is less than 70. Per her orthopedist's request, I will check a sedimentation rate as well as a CRP to evaluate the pain in both knees. I will continue to prescribe her pain medication at the current dose. She also has a goiter which is a coincidental finding on examination. I will schedule the patient for an ultrasound of her neck to evaluate further. I recommended a pneumonia vaccine, Prevnar 13, but she refused today. She also refused referral for colonoscopy as well as a flu shot. The remainder of her preventative care is up-to-date. Her blood pressure is controlled..  Pulmonary function tests show an FEV1 to FVC ratio 72% which is consistent with mild obstruction, , FEV1 is 1.82 L which is 75% of predicted. 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 Also mentioned increasing her pain medication for her knee. I explained to the patient that I would be willing to continue the current dose that she is taking however she wants to increase the amount of narcotics she is taking due to the volume of narcotics that she's taking I would recommend a pain clinic referral. She states that she will discuss that with me further after the issues with her knee or dealt with.

## 2014-03-28 ENCOUNTER — Other Ambulatory Visit: Payer: Self-pay | Admitting: Family Medicine

## 2014-03-28 DIAGNOSIS — K861 Other chronic pancreatitis: Secondary | ICD-10-CM

## 2014-03-28 LAB — SEDIMENTATION RATE: SED RATE: 5 mm/h (ref 0–30)

## 2014-03-28 LAB — MICROALBUMIN, URINE: MICROALB UR: 3.3 mg/dL — AB (ref <2.0)

## 2014-03-28 MED ORDER — DAPAGLIFLOZIN PROPANEDIOL 5 MG PO TABS: 5.0000 mg | ORAL_TABLET | Freq: Every day | ORAL | Status: DC

## 2014-03-28 MED ORDER — ONDANSETRON HCL 4 MG PO TABS: 4.0000 mg | ORAL_TABLET | Freq: Three times a day (TID) | ORAL | Status: DC | PRN

## 2014-03-31 ENCOUNTER — Other Ambulatory Visit: Payer: 59

## 2014-04-01 ENCOUNTER — Encounter (HOSPITAL_COMMUNITY)
Admission: RE | Admit: 2014-04-01 | Discharge: 2014-04-01 | Disposition: A | Discharge status: Home / Self Care | Payer: 59 | Source: Ambulatory Visit | Attending: Orthopedic Surgery | Admitting: Orthopedic Surgery

## 2014-04-01 DIAGNOSIS — M25561 Pain in right knee: Secondary | ICD-10-CM | POA: Diagnosis not present

## 2014-04-01 MED ORDER — TECHNETIUM TC 99M MEDRONATE IV KIT
24.0000 | PACK | Freq: Once | INTRAVENOUS | Status: AC | PRN
  Administered 2014-04-01: 24 via INTRAVENOUS

## 2014-04-02 ENCOUNTER — Telehealth: Payer: Self-pay | Admitting: Family Medicine

## 2014-04-02 DIAGNOSIS — K861 Other chronic pancreatitis: Secondary | ICD-10-CM

## 2014-04-02 NOTE — Telephone Encounter (Signed)
Patient is calling regarding her pain medication, and also another one of her medications  Please call her at [phone]

## 2014-04-02 NOTE — Telephone Encounter (Signed)
Called pharmacy and her insurance will not cover Marcelline DeistFarxiga but will cover Invokana or Jardiance. Can you change her medication? And is it ok to refill her pain med? And Can I do 3 months?

## 2014-04-03 ENCOUNTER — Ambulatory Visit
Admission: RE | Admit: 2014-04-03 | Discharge: 2014-04-03 | Disposition: A | Discharge status: Home / Self Care | Payer: 59 | Source: Ambulatory Visit | Attending: Family Medicine | Admitting: Family Medicine

## 2014-04-03 DIAGNOSIS — E049 Nontoxic goiter, unspecified: Secondary | ICD-10-CM

## 2014-04-03 NOTE — Telephone Encounter (Signed)
Ok to refill pain med for 1 month given she is new to our practice.  Switch farxiga to invokana 300 poqday.

## 2014-04-04 MED ORDER — CANAGLIFLOZIN 300 MG PO TABS: 300.0000 mg | ORAL_TABLET | Freq: Every day | ORAL | Status: DC

## 2014-04-04 MED ORDER — HYDROCODONE-ACETAMINOPHEN 5-325 MG PO TABS: 1.0000 | ORAL_TABLET | Freq: Four times a day (QID) | ORAL | Status: DC | PRN

## 2014-04-04 NOTE — Telephone Encounter (Signed)
Pt aware pain meds ready for pick up and other med change.

## 2014-04-07 ENCOUNTER — Telehealth: Payer: Self-pay | Admitting: Family Medicine

## 2014-04-07 NOTE — Telephone Encounter (Signed)
Patient says that [facility] is giving her trouble getting this  Please call her at [phone] or [phone]

## 2014-04-07 NOTE — Telephone Encounter (Signed)
Trouble getting what?

## 2014-04-07 NOTE — Telephone Encounter (Signed)
Needs PA for Invokana as well - will do through [email or web address]

## 2014-04-07 NOTE — Telephone Encounter (Signed)
Submitted PA through [email or web address]  

## 2014-04-08 MED ORDER — CANAGLIFLOZIN 300 MG PO TABS: 300.0000 mg | ORAL_TABLET | Freq: Every day | ORAL | Status: DC

## 2014-04-08 NOTE — Telephone Encounter (Signed)
Invokana approved through 04/07/15 - Escribed to pharm

## 2014-04-09 ENCOUNTER — Telehealth: Payer: Self-pay | Admitting: Cardiovascular Disease

## 2014-04-09 NOTE — Telephone Encounter (Signed)
I will forward this message to Dr Kirke CorinArida who follows the pt for PAD and Dr Shirlee LatchMcLean who is the pt's primary cardiologist to address surgical clearance.

## 2014-04-09 NOTE — Telephone Encounter (Signed)
New Msg       Request for surgical clearance:  1. What type of surgery is being performed?  Left knee total knee replacement   2. When is this surgery scheduled? Not scheduled yet   3. Are there any medications that need to be held prior to surgery and how long? Stop Plavix. Pt not sure exactly how long  4. Name of physician performing surgery? Dr. Lyndle Herrlichonald Gifray  5. What is your office phone and fax number? [phone].   Pt calling, would like call back in regards to this.

## 2014-04-09 NOTE — Telephone Encounter (Signed)
Recent low risk Cardiolite.  OK to hold Plavix and have surgery.

## 2014-04-10 ENCOUNTER — Telehealth: Payer: Self-pay | Admitting: Family Medicine

## 2014-04-10 ENCOUNTER — Other Ambulatory Visit: Payer: Self-pay | Admitting: Family Medicine

## 2014-04-10 MED ORDER — GLUCOSE BLOOD VI STRP: 1.0000 | ORAL_STRIP | Status: DC | PRN

## 2014-04-10 MED ORDER — GLUCOSE BLOOD VI STRP: ORAL_STRIP | Status: DC

## 2014-04-10 NOTE — Telephone Encounter (Signed)
Pt states MD is Dr [facility]elinonald [facility] not Dr Carollee SiresGifray.  I will forward to Dr Darrelyn HillockGioffre.

## 2014-04-10 NOTE — Telephone Encounter (Signed)
Pt given Dr Alford HighlandMcLean's recommendations about holding Plavix and surgery.

## 2014-04-10 NOTE — Telephone Encounter (Signed)
rx for strips sent to pharm

## 2014-04-10 NOTE — Telephone Encounter (Signed)
Pt notified Ok to hold Plavix from Dr Jari SportsmanArida's standpoint.   Pt advised I will forward this to Dr Carollee SiresGifray

## 2014-04-10 NOTE — Telephone Encounter (Signed)
Ok to hold Plavix for a week from my standpoint.

## 2014-04-10 NOTE — Telephone Encounter (Signed)
Pt states Dr Carollee SiresGifray requesting Dr Rebekah ChesterfieldArida's Ok to hold Plavix prior to surgery also.  Pt advised I will forward to Dr Kirke CorinArida.

## 2014-04-10 NOTE — Telephone Encounter (Signed)
Patient would like to get her test strips refilled  [facility]  [phone] if any questions

## 2014-04-16 ENCOUNTER — Encounter: Payer: Self-pay | Admitting: Family Medicine

## 2014-04-17 ENCOUNTER — Telehealth: Payer: Self-pay | Admitting: Cardiology

## 2014-04-17 ENCOUNTER — Telehealth: Payer: Self-pay | Admitting: Family Medicine

## 2014-04-17 MED ORDER — OMEGA-3-ACID ETHYL ESTERS 1 G PO CAPS: 2.0000 g | ORAL_CAPSULE | Freq: Two times a day (BID) | ORAL | Status: DC

## 2014-04-17 MED ORDER — SITAGLIPTIN PHOSPHATE 50 MG PO TABS: 50.0000 mg | ORAL_TABLET | Freq: Every day | ORAL | Status: DC

## 2014-04-17 MED ORDER — FLUCONAZOLE 150 MG PO TABS: 150.0000 mg | ORAL_TABLET | Freq: Once | ORAL | Status: DC

## 2014-04-17 MED ORDER — ROSUVASTATIN CALCIUM 40 MG PO TABS: 40.0000 mg | ORAL_TABLET | Freq: Every day | ORAL | Status: DC

## 2014-04-17 NOTE — Telephone Encounter (Signed)
Pt called and states that the medication Invokana is causing a yeast infection and wanted to know if we could call something in for it? Per WTP ok to call in Diflucan and change Invokana to Januvia 50mg  - med sent to pharm and pt aware - also refilled crestor and lovaza.

## 2014-04-17 NOTE — Telephone Encounter (Signed)
Received request from Nurse fax box, documents faxed for surgical clearance. To: [facility] Fax number: [phone] Attention: 3.3.16/km

## 2014-04-18 ENCOUNTER — Telehealth: Payer: Self-pay | Admitting: Family Medicine

## 2014-04-18 ENCOUNTER — Telehealth: Payer: Self-pay | Admitting: *Deleted

## 2014-04-18 NOTE — Telephone Encounter (Signed)
PA submitted through [email or web address] for Januvia

## 2014-04-18 NOTE — Telephone Encounter (Signed)
Received request from pharmacy for PA on Lovaza.   PA submitted.   Dx: Hyperlipidemia (E78.5)

## 2014-04-21 MED ORDER — PIOGLITAZONE HCL 30 MG PO TABS: 30.0000 mg | ORAL_TABLET | Freq: Every day | ORAL | Status: DC

## 2014-04-21 NOTE — Telephone Encounter (Signed)
Pt aware of denial and med sent to pharm.

## 2014-04-21 NOTE — Telephone Encounter (Signed)
actos 30 mg poqday

## 2014-04-21 NOTE — Telephone Encounter (Signed)
Did PA for [place]uvia and lovaza and both were denied. Recommendations?

## 2014-04-21 NOTE — Telephone Encounter (Signed)
PA was denied and per WTP ok with otc fish oil 2 grams bid - pt aware

## 2014-04-22 ENCOUNTER — Other Ambulatory Visit: Payer: Self-pay | Admitting: Surgical

## 2014-05-12 NOTE — Progress Notes (Signed)
pft 2/16 epic ekg 8/15 epic Stress test 03/24/14 Clearance note with directions to hold plavix 5 days pre op Dr Shirlee LatchMcLean chart

## 2014-05-13 ENCOUNTER — Other Ambulatory Visit: Payer: Self-pay | Admitting: Surgical

## 2014-05-13 ENCOUNTER — Encounter (HOSPITAL_COMMUNITY): Payer: Self-pay

## 2014-05-13 ENCOUNTER — Encounter (HOSPITAL_COMMUNITY)
Admission: RE | Admit: 2014-05-13 | Discharge: 2014-05-13 | Disposition: A | Discharge status: Home / Self Care | Payer: 59 | Source: Ambulatory Visit | Attending: Orthopedic Surgery | Admitting: Orthopedic Surgery

## 2014-05-13 ENCOUNTER — Ambulatory Visit (HOSPITAL_COMMUNITY)
Admission: RE | Admit: 2014-05-13 | Discharge: 2014-05-13 | Disposition: A | Discharge status: Home / Self Care | Payer: 59 | Source: Ambulatory Visit | Attending: Surgical | Admitting: Surgical

## 2014-05-13 DIAGNOSIS — J45909 Unspecified asthma, uncomplicated: Secondary | ICD-10-CM | POA: Diagnosis not present

## 2014-05-13 DIAGNOSIS — Z01818 Encounter for other preprocedural examination: Secondary | ICD-10-CM | POA: Diagnosis not present

## 2014-05-13 HISTORY — DX: Other complications of anesthesia, initial encounter: T88.59XA

## 2014-05-13 HISTORY — DX: Headache: R51

## 2014-05-13 HISTORY — DX: Adverse effect of unspecified anesthetic, initial encounter: T41.45XA

## 2014-05-13 HISTORY — DX: Essential (primary) hypertension: I10

## 2014-05-13 HISTORY — DX: Headache, unspecified: R51.9

## 2014-05-13 LAB — APTT: aPTT: 34 seconds (ref 24–37)

## 2014-05-13 LAB — CBC WITH DIFFERENTIAL/PLATELET
Basophils Absolute: 0 10*3/uL (ref 0.0–0.1)
Basophils Relative: 1 % (ref 0–1)
Eosinophils Absolute: 0.3 10*3/uL (ref 0.0–0.7)
Eosinophils Relative: 4 % (ref 0–5)
HCT: 39.8 % (ref 36.0–46.0)
Hemoglobin: 12.2 g/dL (ref 12.0–15.0)
Lymphocytes Relative: 25 % (ref 12–46)
Lymphs Abs: 1.7 10*3/uL (ref 0.7–4.0)
MCH: 27.6 pg (ref 26.0–34.0)
MCHC: 30.7 g/dL (ref 30.0–36.0)
MCV: 90 fL (ref 78.0–100.0)
Monocytes Absolute: 0.4 10*3/uL (ref 0.1–1.0)
Monocytes Relative: 6 % (ref 3–12)
Neutro Abs: 4.2 10*3/uL (ref 1.7–7.7)
Neutrophils Relative %: 64 % (ref 43–77)
Platelets: 255 10*3/uL (ref 150–400)
RBC: 4.42 MIL/uL (ref 3.87–5.11)
RDW: 14 % (ref 11.5–15.5)
WBC: 6.6 10*3/uL (ref 4.0–10.5)

## 2014-05-13 LAB — COMPREHENSIVE METABOLIC PANEL
ALT: 23 U/L (ref 0–35)
AST: 28 U/L (ref 0–37)
Albumin: 4.1 g/dL (ref 3.5–5.2)
Alkaline Phosphatase: 62 U/L (ref 39–117)
Anion gap: 12 (ref 5–15)
BUN: 28 mg/dL — ABNORMAL HIGH (ref 6–23)
CO2: 24 mmol/L (ref 19–32)
Calcium: 9.9 mg/dL (ref 8.4–10.5)
Chloride: 104 mmol/L (ref 96–112)
Creatinine, Ser: 1.41 mg/dL — ABNORMAL HIGH (ref 0.50–1.10)
GFR calc Af Amer: 47 mL/min — ABNORMAL LOW (ref >90)
GFR calc non Af Amer: 40 mL/min — ABNORMAL LOW (ref >90)
Glucose, Bld: 155 mg/dL — ABNORMAL HIGH (ref 70–99)
Potassium: 5.1 mmol/L (ref 3.5–5.1)
Sodium: 140 mmol/L (ref 135–145)
Total Bilirubin: 0.4 mg/dL (ref 0.3–1.2)
Total Protein: 7.1 g/dL (ref 6.0–8.3)

## 2014-05-13 LAB — URINALYSIS, ROUTINE W REFLEX MICROSCOPIC
Bilirubin Urine: NEGATIVE
Glucose, UA: NEGATIVE mg/dL
Hgb urine dipstick: NEGATIVE
Ketones, ur: NEGATIVE mg/dL
Leukocytes, UA: NEGATIVE
Nitrite: NEGATIVE
Protein, ur: NEGATIVE mg/dL
Specific Gravity, Urine: 1.017 (ref 1.005–1.030)
Urobilinogen, UA: 0.2 mg/dL (ref 0.0–1.0)
pH: 5 (ref 5.0–8.0)

## 2014-05-13 LAB — PROTIME-INR
INR: 0.88 (ref 0.00–1.49)
Prothrombin Time: 12.1 seconds (ref 11.6–15.2)

## 2014-05-13 LAB — SURGICAL PCR SCREEN
MRSA, PCR: NEGATIVE
Staphylococcus aureus: POSITIVE — AB

## 2014-05-13 NOTE — Progress Notes (Signed)
05-13-14 1600 labs viewable in Epic, note BUN. Note fax per Epic to [phone].

## 2014-05-13 NOTE — Progress Notes (Signed)
05-13-14 1750 Positive for Staph aureus per PCR- pt notified and note faxed to Dr. Darrelyn HillockGioffre office [phone], pt. To use Mupirocin ointment called to [facility] [address] [phone], to use as directed.

## 2014-05-13 NOTE — Progress Notes (Signed)
EKG- 09/17/2013 on chart  Stress Test- 03/24/2014 on chart  Pulmonary function Test- 03/27/14 on chart  01/02/2014- LOV with Cardiology - EPIC  Clearance - Dr Shirlee LatchMcLean on chart -04/16/2014

## 2014-05-13 NOTE — Patient Instructions (Addendum)
Leslie KluverRoxie Bergin  05/13/2014   Your procedure is scheduled on:  05/22/2014    Report to [facility] Main  Entr[facility] and follow signs to               Short Stay Center at  1045  AM.  Call this number if you have problems the morning of surgery [phone]   Remember: Eat a good healthy snack prior to bedtime.    Do not eat food or drink liquids :After Midnight.     Take these medicines the morning of surgery with A SIP OF WATER:  Nexium, Imdur ( isosorbide ), Advair Inhaler and bring, Proventil Inhaler if needed and bring , Flonase nasal spray              No diabetic medications am of surgery.                                 You may not have any metal on your body including hair pins and              piercings  Do not wear jewelry, make-up, lotions, powders or perfumes., deodorant.               Do not wear nail polish.  Do not shave  48 hours prior to surgery.                Do not bring valuables to the hospital. [place] IS NOT             RESPONSIBLE   FOR VALUABLES.  Contacts, dentures or bridgework may not be worn into surgery.  Leave suitcase in the car. After surgery it may be brought to your room.         Special Instructions: coughing and deep breathing exercises, leg exercises               Please read over the following fact sheets you were given: _____________________________________________________________________             [facility] - Preparing [facility] Before surgery, you can play an important role.  Because skin is not sterile, your skin needs to be as free of germs as possible.  You can reduce the number of germs on your skin by washing with CHG (chlorahexidine gluconate) soap before surgery.  CHG is an antiseptic cleaner which kills germs and bonds with the skin to continue killing germs even after washing. Please DO NOT use if you have an allergy to CHG or antibacterial soaps.  If your skin becomes reddened/irritated stop using  the CHG and inform your nurse when you arrive at Short Stay. Do not shave (including legs and underarms) for at least 48 hours prior to the first CHG shower.  You may shave your face/neck. Please follow these instructions carefully:  1.  Shower with CHG Soap the night before surgery and the  morning of Surgery.  2.  If you choose to wash your hair, wash your hair first as usual with your  normal  shampoo.  3.  After you shampoo, rinse your hair and body thoroughly to remove the  shampoo.                           4.  Use CHG as you would  any other liquid soap.  You can apply chg directly  to the skin and wash                       Gently with a scrungie or clean washcloth.  5.  Apply the CHG Soap to your body ONLY FROM THE NECK DOWN.   Do not use on face/ open                           Wound or open sores. Avoid contact with eyes, ears mouth and genitals (private parts).                       Wash face,  Genitals (private parts) with your normal soap.             6.  Wash thoroughly, paying special attention to the area where your surgery  will be performed.  7.  Thoroughly rinse your body with warm water from the neck down.  8.  DO NOT shower/wash with your normal soap after using and rinsing off  the CHG Soap.                9.  Pat yourself dry with a clean towel.            10.  Wear clean pajamas.            11.  Place clean sheets on your bed the night of your first shower and do not  sleep with pets. Day of Surgery : Do not apply any lotions/deodorants the morning of surgery.  Please wear clean clothes to the hospital/surgery center.  FAILURE TO FOLLOW THESE INSTRUCTIONS MAY RESULT IN THE CANCELLATION OF YOUR SURGERY PATIENT SIGNATURE_________________________________  NURSE SIGNATURE__________________________________  ________________________________________________________________________  WHAT IS A BLOOD TRANSFUSION? Blood Transfusion Information  A transfusion is the replacement  of blood or some of its parts. Blood is made up of multiple cells which provide different functions.  Red blood cells carry oxygen and are used for blood loss replacement.  White blood cells fight against infection.  Platelets control bleeding.  Plasma helps clot blood.  Other blood products are available for specialized needs, such as hemophilia or other clotting disorders. BEFORE THE TRANSFUSION  Who gives blood for transfusions?   Healthy volunteers who are fully evaluated to make sure their blood is safe. This is blood bank blood. Transfusion therapy is the safest it has ever been in the practice of medicine. Before blood is taken from a donor, a complete history is taken to make sure that person has no history of diseases nor engages in risky social behavior (examples are intravenous drug use or sexual activity with multiple partners). The donor's travel history is screened to minimize risk of transmitting infections, such as malaria. The donated blood is tested for signs of infectious diseases, such as HIV and hepatitis. The blood is then tested to be sure it is compatible with you in order to minimize the chance of a transfusion reaction. If you or a relative donates blood, this is often done in anticipation of surgery and is not appropriate for emergency situations. It takes many days to process the donated blood. RISKS AND COMPLICATIONS Although transfusion therapy is very safe and saves many lives, the main dangers of transfusion include:  1. Getting an infectious disease. 2. Developing a transfusion reaction. This is an allergic reaction to something in  the blood you were given. Every precaution is taken to prevent this. The decision to have a blood transfusion has been considered carefully by your caregiver before blood is given. Blood is not given unless the benefits outweigh the risks. AFTER THE TRANSFUSION  Right after receiving a blood transfusion, you will usually feel much  better and more energetic. This is especially true if your red blood cells have gotten low (anemic). The transfusion raises the level of the red blood cells which carry oxygen, and this usually causes an energy increase.  The nurse administering the transfusion will monitor you carefully for complications. HOME CARE INSTRUCTIONS  No special instructions are needed after a transfusion. You may find your energy is better. Speak with your caregiver about any limitations on activity for underlying diseases you may have. SEEK MEDICAL CARE IF:   Your condition is not improving after your transfusion.  You develop redness or irritation at the intravenous (IV) site. SEEK IMMEDIATE MEDICAL CARE IF:  Any of the following symptoms occur over the next 12 hours:  Shaking chills.  You have a temperature by mouth above 102 F (38.9 C), not controlled by medicine.  Chest, back, or muscle pain.  People around you feel you are not acting correctly or are confused.  Shortness of breath or difficulty breathing.  Dizziness and fainting.  You get a rash or develop hives.  You have a decrease in urine output.  Your urine turns a dark color or changes to pink, red, or brown. Any of the following symptoms occur over the next 10 days:  You have a temperature by mouth above 102 F (38.9 C), not controlled by medicine.  Shortness of breath.  Weakness after normal activity.  The white part of the eye turns yellow (jaundice).  You have a decrease in the amount of urine or are urinating less often.  Your urine turns a dark color or changes to pink, red, or brown. Document Released: 01/29/2000 Document Revised: 04/25/2011 Document Reviewed: 09/17/2007 Exit[employer] Patient Information 2014 [place], [place].  _______________________________________________________________________  Incentive Spirometer  An incentive spirometer is a tool that can help keep your lungs clear and active. This tool measures  how well you are filling your lungs with each breath. Taking long deep breaths may help reverse or decrease the chance of developing breathing (pulmonary) problems (especially infection) following:  A long period of time when you are unable to move or be active. BEFORE THE PROCEDURE   If the spirometer includes an indicator to show your best effort, your nurse or respiratory therapist will set it to a desired goal.  If possible, sit up straight or lean slightly forward. Try not to slouch.  Hold the incentive spirometer in an upright position. INSTRUCTIONS FOR USE  3. Sit on the edge of your bed if possible, or sit up as far as you can in bed or on a chair. 4. Hold the incentive spirometer in an upright position. 5. Breathe out normally. 6. Place the mouthpiece in your mouth and seal your lips tightly around it. 7. Breathe in slowly and as deeply as possible, raising the piston or the ball toward the top of the column. 8. Hold your breath for 3-5 seconds or for as long as possible. Allow the piston or ball to fall to the bottom of the column. 9. Remove the mouthpiece from your mouth and breathe out normally. 10. Rest for a few seconds and repeat Steps 1 through 7 at least 10 times every  1-2 hours when you are awake. Take your time and take a few normal breaths between deep breaths. 11. The spirometer may include an indicator to show your best effort. Use the indicator as a goal to work toward during each repetition. 12. After each set of 10 deep breaths, practice coughing to be sure your lungs are clear. If you have an incision (the cut made at the time of surgery), support your incision when coughing by placing a pillow or rolled up towels firmly against it. Once you are able to get out of bed, walk around indoors and cough well. You may stop using the incentive spirometer when instructed by your caregiver.  RISKS AND COMPLICATIONS  Take your time so you do not get dizzy or  light-headed.  If you are in pain, you may need to take or ask for pain medication before doing incentive spirometry. It is harder to take a deep breath if you are having pain. AFTER USE  Rest and breathe slowly and easily.  It can be helpful to keep track of a log of your progress. Your caregiver can provide you with a simple table to help with this. If you are using the spirometer at home, follow these instructions: SEEK MEDICAL CARE IF:   You are having difficultly using the spirometer.  You have trouble using the spirometer as often as instructed.  Your pain medication is not giving enough relief while using the spirometer.  You develop fever of 100.5 F (38.1 C) or higher. SEEK IMMEDIATE MEDICAL CARE IF:   You cough up bloody sputum that had not been present before.  You develop fever of 102 F (38.9 C) or greater.  You develop worsening pain at or near the incision site. MAKE SURE YOU:   Understand these instructions.  Will watch your condition.  Will get help right away if you are not doing well or get worse. Document Released: 06/13/2006 Document Revised: 04/25/2011 Document Reviewed: 08/14/2006 [facility] Patient Information 2014 [place], [place].   ________________________________________________________________________

## 2014-05-14 ENCOUNTER — Telehealth: Payer: Self-pay | Admitting: Family Medicine

## 2014-05-14 DIAGNOSIS — K861 Other chronic pancreatitis: Secondary | ICD-10-CM

## 2014-05-14 NOTE — Telephone Encounter (Signed)
?   OK to Refill  

## 2014-05-14 NOTE — Telephone Encounter (Signed)
[phone] Pt is needing a refill on HYDROcodone-acetaminophen (NORCO/VICODIN) 5-325 MG per tablet

## 2014-05-15 ENCOUNTER — Telehealth: Payer: Self-pay | Admitting: Family Medicine

## 2014-05-15 MED ORDER — HYDROCODONE-ACETAMINOPHEN 5-325 MG PO TABS: 1.0000 | ORAL_TABLET | Freq: Four times a day (QID) | ORAL | Status: DC | PRN

## 2014-05-15 MED ORDER — ESOMEPRAZOLE MAGNESIUM 20 MG PO CPDR: 20.0000 mg | DELAYED_RELEASE_CAPSULE | Freq: Every day | ORAL | Status: DC

## 2014-05-15 NOTE — Telephone Encounter (Signed)
Patient would like nexium called into her pharmacy if possible  [facility]  [phone] if questions

## 2014-05-15 NOTE — Telephone Encounter (Signed)
ok 

## 2014-05-15 NOTE — Telephone Encounter (Signed)
Med sent to pharm 

## 2014-05-15 NOTE — Telephone Encounter (Signed)
RX printed, left up front and patient aware to pick up  

## 2014-05-16 ENCOUNTER — Telehealth: Payer: Self-pay | Admitting: Family Medicine

## 2014-05-16 NOTE — Telephone Encounter (Signed)
Pt called and made aware of conflict with Nexium and Clopidogrel.  Rx to pharmacy for Pantoprazole.

## 2014-05-16 NOTE — Telephone Encounter (Signed)
Rec'd notice from pharmacy for PA for Esomeprazole 20mg .  Reason, pt on Clopidogrel.  Insurance wants pt to try Omeprazole, Pantoprazole or Rabeprazole.  Please advise?

## 2014-05-16 NOTE — Telephone Encounter (Signed)
Ok to switch to pantoprazole 40 mg

## 2014-05-19 MED ORDER — PANTOPRAZOLE SODIUM 40 MG PO TBEC: 40.0000 mg | DELAYED_RELEASE_TABLET | Freq: Every day | ORAL | Status: DC

## 2014-05-20 NOTE — H&P (Signed)
TOTAL KNEE ADMISSION H&P  Patient is being admitted for left total knee arthroplasty.  Subjective:  Chief Complaint:left knee pain.  HPI: [name], 59 y.o. female, has a history of pain and functional disability in the left knee due to arthritis and has failed non-surgical conservative treatments for greater than 12 weeks to includeNSAID's and/or analgesics, corticosteriod injections, use of assistive devices and activity modification.  Onset of symptoms was gradual, starting 3 years ago with gradually worsening course since that time. The patient noted no past surgery on the left knee(s).  Patient currently rates pain in the left knee(s) at 7 out of 10 with activity. Patient has night pain, worsening of pain with activity and weight bearing, pain that interferes with activities of daily living, pain with passive range of motion, crepitus and joint swelling.  Patient has evidence of periarticular osteophytes and joint space narrowing by imaging studies.  There is no active infection.  Patient Active Problem List   Diagnosis Date Noted  . Pancreatitis 01/27/2013  . Cutaneous skin tags 09/09/2012  . Bilateral leg pain 07/20/2012  . Nausea and vomiting in adult 10/25/2011  . Arthritis of knee 10/25/2011  . GERD (gastroesophageal reflux disease) 11/04/2010  . Coronary atherosclerosis 03/02/2009  . PAD (peripheral artery disease) 01/30/2009  . Diabetes mellitus, type II 01/02/2009  . DYSLIPIDEMIA 01/02/2009  . TOBACCO ABUSE 01/02/2009  . HYPERTENSION 01/02/2009   Past Medical History  Diagnosis Date  . CAD (coronary artery disease)     ETT-myoview (12/10) with apical anterior ischemia, EF 75%.  LHC (1/11) showed diffuse disease..   There was a large D3 with serial 70%, 80-90%, and 70% stenoses then severe diffuse distal vessel disease.   Small to moderate OM1 with severe diffuse disease, 50% mRCA, 95% mid small PLV, 60% PDA.  EF 60%.  Patient managed medically, no interventional options.    . Other and unspecified hyperlipidemia 401.9    250.00  . Tobacco use disorder     quit 11/10  . Osteoarthritis     severe right knee  R TKR  . Asthma with COPD   . Pancreatitis 2003  . Diabetes mellitus without complication   . Complication of anesthesia     slow to wake up with last surgery in 2011   . Hypertension   . PAD (peripheral artery disease)     Severe stenosis mid right SFA s/p atherectomy 03/25/09. Moderate disease diffusely throughout the  iliacs, bilateral SFA, below knee vessels.  ABIs post-procedure were 0.85 on right and 0.99 on left.   . Headache     hx of migraines     Past Surgical History  Procedure Laterality Date  . Joint replacement  2011    right knee replacement   . Tubal ligation  1980  . Abdominal aortagram N/A 12/21/2011    Procedure: ABDOMINAL Ronny Flurry;  Surgeon: Iran Ouch, MD;  Location: [facility] CATH LAB;  Service: Cardiovascular;  Laterality: N/A;  . Cardiac catheterization        Current outpatient prescriptions:  .  acidophilus (RISAQUAD) CAPS capsule, Take 1 capsule by mouth daily., Disp: , Rfl:  .  albuterol (PROVENTIL HFA;VENTOLIN HFA) 108 (90 BASE) MCG/ACT inhaler, Inhale 2 puffs into the lungs 2 (two) times daily as needed for wheezing or shortness of breath., Disp: 3 Inhaler, Rfl: 3 .  aspirin 81 MG tablet, Take 81 mg by mouth daily.  , Disp: , Rfl:  .  bisoprolol-hydrochlorothiazide (ZIAC) 2.5-6.25 MG per tablet, Take 1  tablet by mouth daily., Disp: 90 tablet, Rfl: 1 .  clopidogrel (PLAVIX) 75 MG tablet, TAKE 1 TABLET BY MOUTH DAILY, Disp: 30 tablet, Rfl: 6 .  diphenhydrAMINE (BENADRYL) 25 mg capsule, Take 25 mg by mouth every 6 (six) hours as needed for allergies., Disp: , Rfl:  .  Fluticasone-Salmeterol (ADVAIR DISKUS) 250-50 MCG/DOSE AEPB, Inhale 1 puff into the lungs every 12 (twelve) hours., Disp: 180 each, Rfl: 3 .  glimepiride (AMARYL) 4 MG tablet, Take 1 tablet (4 mg total) by mouth daily with lunch., Disp: 90 tablet, Rfl: 4 .   isosorbide mononitrate (IMDUR) 60 MG 24 hr tablet, Take 1 tablet (60 mg total) by mouth daily., Disp: 90 tablet, Rfl: 3 .  lisinopril (PRINIVIL,ZESTRIL) 10 MG tablet, Take 1 tablet (10 mg total) by mouth daily., Disp: 90 tablet, Rfl: 4 .  metFORMIN (GLUCOPHAGE) 1000 MG tablet, Take 1 tablet (1,000 mg total) by mouth 2 (two) times daily with a meal., Disp: 60 tablet, Rfl: 11 .  Multiple Vitamin (MULTIVITAMIN) tablet, Take 1 tablet by mouth daily.  , Disp: , Rfl:  .  omega-3 acid ethyl esters (LOVAZA) 1 G capsule, Take 2 capsules (2 g total) by mouth 2 (two) times daily., Disp: 120 capsule, Rfl: 11 .  ondansetron (ZOFRAN) 4 MG tablet, Take 1 tablet (4 mg total) by mouth every 8 (eight) hours as needed for nausea or vomiting., Disp: 30 tablet, Rfl: 3 .  pioglitazone (ACTOS) 30 MG tablet, Take 1 tablet (30 mg total) by mouth daily., Disp: 30 tablet, Rfl: 3 .  rosuvastatin (CRESTOR) 40 MG tablet, Take 1 tablet (40 mg total) by mouth at bedtime., Disp: 30 tablet, Rfl: 3 .  acetaminophen (TYLENOL) 500 MG tablet, Take 500 mg by mouth every 6 (six) hours as needed., Disp: , Rfl:  .  Aspirin-Acetaminophen 500-325 MG PACK, Take by mouth., Disp: , Rfl:  .  fluticasone (FLONASE) 50 MCG/ACT nasal spray, Place into both nostrils daily., Disp: , Rfl:  .  glucose blood test strip, Check bs qam and prn (Pt has one touch berio meter), Disp: 100 each, Rfl: 5 .  HYDROcodone-acetaminophen (NORCO/VICODIN) 5-325 MG per tablet, Take 1 tablet by mouth 4 (four) times daily as needed., Disp: 120 tablet, Rfl: 0 .  pantoprazole (PROTONIX) 40 MG tablet, Take 1 tablet (40 mg total) by mouth daily., Disp: 30 tablet, Rfl: 11   Allergies  Allergen Reactions  . Glucotrol [Glipizide] Rash    Red rash  . Influenza Vaccines Other (See Comments)    Significant arm soreness requiring 1 year of physical therapy  . Latex Rash  . Milk-Related Compounds Rash    Fever  . Tetanus Toxoid Hives, Swelling and Rash    Swelling to site of  injection with rash and fever  . Gabapentin   . Invokana [Canagliflozin] Other (See Comments)    Yeast Infections  . Nortriptyline     History  Substance Use Topics  . Smoking status: Former Smoker    Quit date: 12/13/2008  . Smokeless tobacco: Never Used  . Alcohol Use: No    Family History  Problem Relation Age of Onset  . Emphysema Father   . CVA Mother      Review of Systems  Constitutional: Positive for diaphoresis. Negative for fever, chills, weight loss and malaise/fatigue.  HENT: Negative for congestion, ear discharge, ear pain, hearing loss, nosebleeds, sore throat and tinnitus.   Eyes: Negative.   Respiratory: Positive for shortness of breath. Negative for cough, hemoptysis,  sputum production, wheezing and stridor.        SOB on exertion  Cardiovascular: Negative.   Gastrointestinal: Positive for heartburn, nausea, vomiting and abdominal pain. Negative for diarrhea, constipation, blood in stool and melena.  Genitourinary: Negative.   Musculoskeletal: Positive for myalgias, back pain and joint pain. Negative for falls and neck pain.       Left knee pain  Skin: Negative.   Neurological: Positive for headaches. Negative for dizziness, tingling, tremors, sensory change, speech change, focal weakness, seizures, loss of consciousness and weakness.  Endo/Heme/Allergies: Positive for environmental allergies. Negative for polydipsia. Does not bruise/bleed easily.  Psychiatric/Behavioral: Negative.     Objective:  Physical Exam  Constitutional: She is oriented to person, place, and time. She appears well-developed. No distress.  Overweight  HENT:  Head: Normocephalic and atraumatic.  Right Ear: External ear normal.  Left Ear: External ear normal.  Nose: Nose normal.  Mouth/Throat: Oropharynx is clear and moist.  Eyes: Conjunctivae and EOM are normal.  Neck: Normal range of motion. Neck supple.  Cardiovascular: Normal rate, regular rhythm, normal heart sounds and  intact distal pulses.   No murmur heard. Respiratory: Effort normal and breath sounds normal. No respiratory distress. She has no wheezes.  GI: Soft. Bowel sounds are normal. She exhibits no distension. There is no tenderness.  Musculoskeletal:       Right hip: Normal.       Left hip: Normal.       Right knee: Normal.       Left knee: She exhibits decreased range of motion and swelling. She exhibits no effusion and no erythema. Tenderness found. Medial joint line and lateral joint line tenderness noted.  Neurological: She is alert and oriented to person, place, and time. She has normal strength and normal reflexes. No cranial nerve deficit.  Skin: No rash noted. She is not diaphoretic. No erythema.  Psychiatric: She has a normal mood and affect. Her behavior is normal.    Vitals  Weight: 175 lb Height: 63in Body Surface Area: 1.83 m Body Mass Index: 31 kg/m  Pulse: 72 (Regular)  BP: 120/70 (Sitting, Left Arm, Standard)   Imaging Review Plain radiographs demonstrate severe degenerative joint disease of the left knee(s). The overall alignment ismild varus. The bone quality appears to be good for age and reported activity level.  Assessment/Plan:  End stage primary osteoarthritis, left knee   The patient history, physical examination, clinical judgment of the provider and imaging studies are consistent with end stage degenerative joint disease of the left knee(s) and total knee arthroplasty is deemed medically necessary. The treatment options including medical management, injection therapy arthroscopy and arthroplasty were discussed at length. The risks and benefits of total knee arthroplasty were presented and reviewed. The risks due to aseptic loosening, infection, stiffness, patella tracking problems, thromboembolic complications and other imponderables were discussed. The patient acknowledged the explanation, agreed to proceed with the plan and consent was signed. Patient is  being admitted for inpatient treatment for surgery, pain control, PT, OT, prophylactic antibiotics, VTE prophylaxis, progressive ambulation and ADL's and discharge planning. The patient is planning to be discharged home with home health services   Topical TXA Cardio: Dr. Shirlee Latch PCP: Dr. Arlys John, PA-C

## 2014-05-22 ENCOUNTER — Encounter (HOSPITAL_COMMUNITY): Payer: Self-pay | Admitting: Certified Registered Nurse Anesthetist

## 2014-05-22 ENCOUNTER — Encounter (HOSPITAL_COMMUNITY)
Admission: RE | Disposition: A | Discharge status: Home Health | Payer: Self-pay | Source: Ambulatory Visit | Attending: Orthopedic Surgery

## 2014-05-22 ENCOUNTER — Inpatient Hospital Stay (HOSPITAL_COMMUNITY): Payer: 59 | Admitting: Certified Registered Nurse Anesthetist

## 2014-05-22 ENCOUNTER — Inpatient Hospital Stay (HOSPITAL_COMMUNITY)
Admission: RE | Admit: 2014-05-22 | Discharge: 2014-05-24 | DRG: 470 | Disposition: A | Discharge status: Home Health | Payer: 59 | Source: Ambulatory Visit | Attending: Orthopedic Surgery | Admitting: Orthopedic Surgery

## 2014-05-22 DIAGNOSIS — I10 Essential (primary) hypertension: Secondary | ICD-10-CM | POA: Diagnosis present

## 2014-05-22 DIAGNOSIS — M1712 Unilateral primary osteoarthritis, left knee: Secondary | ICD-10-CM | POA: Diagnosis present

## 2014-05-22 DIAGNOSIS — Z96659 Presence of unspecified artificial knee joint: Secondary | ICD-10-CM

## 2014-05-22 DIAGNOSIS — J45909 Unspecified asthma, uncomplicated: Secondary | ICD-10-CM | POA: Diagnosis present

## 2014-05-22 DIAGNOSIS — I739 Peripheral vascular disease, unspecified: Secondary | ICD-10-CM | POA: Diagnosis present

## 2014-05-22 DIAGNOSIS — Z96653 Presence of artificial knee joint, bilateral: Secondary | ICD-10-CM

## 2014-05-22 DIAGNOSIS — E119 Type 2 diabetes mellitus without complications: Secondary | ICD-10-CM | POA: Diagnosis present

## 2014-05-22 DIAGNOSIS — J449 Chronic obstructive pulmonary disease, unspecified: Secondary | ICD-10-CM | POA: Diagnosis present

## 2014-05-22 DIAGNOSIS — K219 Gastro-esophageal reflux disease without esophagitis: Secondary | ICD-10-CM | POA: Diagnosis present

## 2014-05-22 DIAGNOSIS — M25562 Pain in left knee: Secondary | ICD-10-CM | POA: Diagnosis present

## 2014-05-22 DIAGNOSIS — Z96651 Presence of right artificial knee joint: Secondary | ICD-10-CM | POA: Diagnosis present

## 2014-05-22 DIAGNOSIS — I2510 Atherosclerotic heart disease of native coronary artery without angina pectoris: Secondary | ICD-10-CM | POA: Diagnosis present

## 2014-05-22 DIAGNOSIS — Z96652 Presence of left artificial knee joint: Secondary | ICD-10-CM

## 2014-05-22 DIAGNOSIS — E785 Hyperlipidemia, unspecified: Secondary | ICD-10-CM | POA: Diagnosis present

## 2014-05-22 DIAGNOSIS — Z87891 Personal history of nicotine dependence: Secondary | ICD-10-CM

## 2014-05-22 HISTORY — PX: TOTAL KNEE ARTHROPLASTY: SHX125

## 2014-05-22 LAB — GLUCOSE, CAPILLARY
GLUCOSE-CAPILLARY: 129 mg/dL — AB (ref 70–99)
GLUCOSE-CAPILLARY: 174 mg/dL — AB (ref 70–99)
GLUCOSE-CAPILLARY: 200 mg/dL — AB (ref 70–99)
GLUCOSE-CAPILLARY: 307 mg/dL — AB (ref 70–99)

## 2014-05-22 LAB — TYPE AND SCREEN
ABO/RH(D): O POS
Antibody Screen: NEGATIVE

## 2014-05-22 SURGERY — ARTHROPLASTY, KNEE, TOTAL
Anesthesia: General | Site: Knee | Laterality: Left

## 2014-05-22 MED ORDER — HYDROMORPHONE HCL 1 MG/ML IJ SOLN
1.0000 mg | INTRAMUSCULAR | Status: DC | PRN
  Administered 2014-05-22 – 2014-05-23 (×8): 1 mg via INTRAVENOUS
  Filled 2014-05-22 (×8): qty 1

## 2014-05-22 MED ORDER — ONDANSETRON HCL 4 MG/2ML IJ SOLN
INTRAMUSCULAR | Status: DC | PRN
  Administered 2014-05-22 (×2): 2 mg via INTRAVENOUS

## 2014-05-22 MED ORDER — LINAGLIPTIN 5 MG PO TABS: 5.0000 mg | ORAL_TABLET | Freq: Every day | ORAL | Status: DC

## 2014-05-22 MED ORDER — OXYCODONE-ACETAMINOPHEN 5-325 MG PO TABS
2.0000 | ORAL_TABLET | ORAL | Status: DC | PRN
  Administered 2014-05-23 – 2014-05-24 (×7): 2 via ORAL
  Filled 2014-05-22 (×7): qty 2

## 2014-05-22 MED ORDER — FENTANYL CITRATE 0.05 MG/ML IJ SOLN
INTRAMUSCULAR | Status: DC | PRN
  Administered 2014-05-22 (×10): 50 ug via INTRAVENOUS

## 2014-05-22 MED ORDER — BUPIVACAINE HCL (PF) 0.25 % IJ SOLN
INTRAMUSCULAR | Status: AC
  Filled 2014-05-22: qty 30

## 2014-05-22 MED ORDER — HYDROMORPHONE HCL 1 MG/ML IJ SOLN
0.2500 mg | INTRAMUSCULAR | Status: DC | PRN
  Administered 2014-05-22 (×2): 1 mg via INTRAVENOUS

## 2014-05-22 MED ORDER — LIDOCAINE HCL (CARDIAC) 20 MG/ML IV SOLN
INTRAVENOUS | Status: AC
  Filled 2014-05-22: qty 5

## 2014-05-22 MED ORDER — PIOGLITAZONE HCL 30 MG PO TABS
30.0000 mg | ORAL_TABLET | Freq: Every day | ORAL | Status: DC
  Administered 2014-05-23 – 2014-05-24 (×2): 30 mg via ORAL
  Filled 2014-05-22 (×2): qty 1

## 2014-05-22 MED ORDER — METHOCARBAMOL 1000 MG/10ML IJ SOLN
500.0000 mg | Freq: Four times a day (QID) | INTRAVENOUS | Status: DC | PRN
  Administered 2014-05-22: 500 mg via INTRAVENOUS
  Filled 2014-05-22 (×2): qty 5

## 2014-05-22 MED ORDER — FLEET ENEMA 7-19 GM/118ML RE ENEM: 1.0000 | ENEMA | Freq: Once | RECTAL | Status: AC | PRN

## 2014-05-22 MED ORDER — SUCCINYLCHOLINE CHLORIDE 20 MG/ML IJ SOLN
INTRAMUSCULAR | Status: DC | PRN
  Administered 2014-05-22: 100 mg via INTRAVENOUS

## 2014-05-22 MED ORDER — ONDANSETRON HCL 4 MG/2ML IJ SOLN
INTRAMUSCULAR | Status: AC
  Filled 2014-05-22: qty 2

## 2014-05-22 MED ORDER — FENTANYL CITRATE 0.05 MG/ML IJ SOLN
INTRAMUSCULAR | Status: AC
  Filled 2014-05-22: qty 2

## 2014-05-22 MED ORDER — ALUM & MAG HYDROXIDE-SIMETH 200-200-20 MG/5ML PO SUSP
30.0000 mL | ORAL | Status: DC | PRN
  Filled 2014-05-22: qty 30

## 2014-05-22 MED ORDER — LISINOPRIL 10 MG PO TABS
10.0000 mg | ORAL_TABLET | Freq: Every day | ORAL | Status: DC
  Administered 2014-05-22 – 2014-05-23 (×2): 10 mg via ORAL
  Filled 2014-05-22 (×3): qty 1

## 2014-05-22 MED ORDER — METHOCARBAMOL 500 MG PO TABS
500.0000 mg | ORAL_TABLET | Freq: Four times a day (QID) | ORAL | Status: DC | PRN
  Administered 2014-05-22 – 2014-05-24 (×6): 500 mg via ORAL
  Filled 2014-05-22 (×6): qty 1

## 2014-05-22 MED ORDER — INSULIN ASPART 100 UNIT/ML ~~LOC~~ SOLN
0.0000 [IU] | Freq: Three times a day (TID) | SUBCUTANEOUS | Status: DC
  Administered 2014-05-22 – 2014-05-23 (×2): 3 [IU] via SUBCUTANEOUS
  Administered 2014-05-23 – 2014-05-24 (×2): 5 [IU] via SUBCUTANEOUS

## 2014-05-22 MED ORDER — CEFAZOLIN SODIUM-DEXTROSE 2-3 GM-% IV SOLR
2.0000 g | INTRAVENOUS | Status: AC
  Administered 2014-05-22: 2 g via INTRAVENOUS

## 2014-05-22 MED ORDER — MIDAZOLAM HCL 5 MG/5ML IJ SOLN
INTRAMUSCULAR | Status: DC | PRN
  Administered 2014-05-22 (×2): 1 mg via INTRAVENOUS

## 2014-05-22 MED ORDER — LACTATED RINGERS IV SOLN
INTRAVENOUS | Status: DC
  Administered 2014-05-22: 1000 mL via INTRAVENOUS
  Administered 2014-05-22 (×2): via INTRAVENOUS

## 2014-05-22 MED ORDER — SODIUM CHLORIDE 0.9 % IV SOLN
2000.0000 mg | Freq: Once | INTRAVENOUS | Status: DC
  Filled 2014-05-22: qty 20

## 2014-05-22 MED ORDER — ALBUTEROL SULFATE (2.5 MG/3ML) 0.083% IN NEBU: 2.5000 mg | INHALATION_SOLUTION | Freq: Two times a day (BID) | RESPIRATORY_TRACT | Status: DC | PRN

## 2014-05-22 MED ORDER — MENTHOL 3 MG MT LOZG: 1.0000 | LOZENGE | OROMUCOSAL | Status: DC | PRN

## 2014-05-22 MED ORDER — FLUCONAZOLE 150 MG PO TABS: 150.0000 mg | ORAL_TABLET | Freq: Once | ORAL | Status: DC

## 2014-05-22 MED ORDER — FENTANYL CITRATE 0.05 MG/ML IJ SOLN
25.0000 ug | INTRAMUSCULAR | Status: DC | PRN
  Administered 2014-05-22 (×3): 50 ug via INTRAVENOUS

## 2014-05-22 MED ORDER — PROPOFOL 10 MG/ML IV BOLUS
INTRAVENOUS | Status: DC | PRN
  Administered 2014-05-22: 50 mg via INTRAVENOUS
  Administered 2014-05-22: 150 mg via INTRAVENOUS

## 2014-05-22 MED ORDER — FENTANYL CITRATE 0.05 MG/ML IJ SOLN
INTRAMUSCULAR | Status: AC
  Filled 2014-05-22: qty 5

## 2014-05-22 MED ORDER — ONDANSETRON HCL 4 MG/2ML IJ SOLN
4.0000 mg | Freq: Four times a day (QID) | INTRAMUSCULAR | Status: DC | PRN
  Administered 2014-05-22: 4 mg via INTRAVENOUS
  Filled 2014-05-22: qty 2

## 2014-05-22 MED ORDER — SODIUM CHLORIDE 0.9 % IR SOLN
Status: DC | PRN
  Administered 2014-05-22: 500 mL

## 2014-05-22 MED ORDER — CEFAZOLIN SODIUM 1-5 GM-% IV SOLN
1.0000 g | Freq: Four times a day (QID) | INTRAVENOUS | Status: AC
  Administered 2014-05-22 – 2014-05-23 (×2): 1 g via INTRAVENOUS
  Filled 2014-05-22 (×2): qty 50

## 2014-05-22 MED ORDER — HYDROCODONE-ACETAMINOPHEN 5-325 MG PO TABS
1.0000 | ORAL_TABLET | Freq: Four times a day (QID) | ORAL | Status: DC | PRN
  Administered 2014-05-22 – 2014-05-23 (×3): 1 via ORAL
  Filled 2014-05-22 (×3): qty 1

## 2014-05-22 MED ORDER — SODIUM CHLORIDE 0.9 % IR SOLN
Status: AC
  Filled 2014-05-22: qty 1

## 2014-05-22 MED ORDER — THROMBIN 5000 UNITS EX SOLR
CUTANEOUS | Status: AC
  Filled 2014-05-22: qty 5000

## 2014-05-22 MED ORDER — PROPOFOL 10 MG/ML IV BOLUS
INTRAVENOUS | Status: AC
  Filled 2014-05-22: qty 20

## 2014-05-22 MED ORDER — CEFAZOLIN SODIUM-DEXTROSE 2-3 GM-% IV SOLR
INTRAVENOUS | Status: AC
  Filled 2014-05-22: qty 50

## 2014-05-22 MED ORDER — SODIUM CHLORIDE 0.9 % IJ SOLN
INTRAMUSCULAR | Status: DC | PRN
  Administered 2014-05-22: 20 mL

## 2014-05-22 MED ORDER — PANTOPRAZOLE SODIUM 40 MG PO TBEC
40.0000 mg | DELAYED_RELEASE_TABLET | Freq: Every day | ORAL | Status: DC
  Administered 2014-05-23 – 2014-05-24 (×2): 40 mg via ORAL
  Filled 2014-05-22 (×3): qty 1

## 2014-05-22 MED ORDER — SODIUM CHLORIDE 0.9 % IJ SOLN
INTRAMUSCULAR | Status: AC
  Filled 2014-05-22: qty 50

## 2014-05-22 MED ORDER — PROMETHAZINE HCL 25 MG/ML IJ SOLN: 6.2500 mg | INTRAMUSCULAR | Status: DC | PRN

## 2014-05-22 MED ORDER — MIDAZOLAM HCL 2 MG/2ML IJ SOLN
INTRAMUSCULAR | Status: AC
  Filled 2014-05-22: qty 2

## 2014-05-22 MED ORDER — ISOSORBIDE MONONITRATE ER 60 MG PO TB24
60.0000 mg | ORAL_TABLET | Freq: Every day | ORAL | Status: DC
  Administered 2014-05-23 – 2014-05-24 (×2): 60 mg via ORAL
  Filled 2014-05-22 (×2): qty 1

## 2014-05-22 MED ORDER — ACETAMINOPHEN 10 MG/ML IV SOLN
1000.0000 mg | Freq: Once | INTRAVENOUS | Status: AC
  Administered 2014-05-22: 1000 mg via INTRAVENOUS
  Filled 2014-05-22: qty 100

## 2014-05-22 MED ORDER — ONDANSETRON HCL 4 MG PO TABS: 4.0000 mg | ORAL_TABLET | Freq: Four times a day (QID) | ORAL | Status: DC | PRN

## 2014-05-22 MED ORDER — ACETAMINOPHEN 650 MG RE SUPP: 650.0000 mg | Freq: Four times a day (QID) | RECTAL | Status: DC | PRN

## 2014-05-22 MED ORDER — RIVAROXABAN 10 MG PO TABS
10.0000 mg | ORAL_TABLET | Freq: Every day | ORAL | Status: DC
  Administered 2014-05-23 – 2014-05-24 (×2): 10 mg via ORAL
  Filled 2014-05-22 (×3): qty 1

## 2014-05-22 MED ORDER — HYDROMORPHONE HCL 2 MG/ML IJ SOLN
INTRAMUSCULAR | Status: AC
Start: 2014-05-22 — End: 2014-05-22
  Filled 2014-05-22: qty 1

## 2014-05-22 MED ORDER — PHENOL 1.4 % MT LIQD: 1.0000 | OROMUCOSAL | Status: DC | PRN

## 2014-05-22 MED ORDER — BUPIVACAINE LIPOSOME 1.3 % IJ SUSP
20.0000 mL | Freq: Once | INTRAMUSCULAR | Status: AC
  Administered 2014-05-22: 20 mL
  Filled 2014-05-22: qty 20

## 2014-05-22 MED ORDER — LIDOCAINE HCL (CARDIAC) 20 MG/ML IV SOLN
INTRAVENOUS | Status: DC | PRN
  Administered 2014-05-22: 75 mg via INTRAVENOUS

## 2014-05-22 MED ORDER — THROMBIN 5000 UNITS EX SOLR
OROMUCOSAL | Status: DC | PRN
  Administered 2014-05-22: 15:00:00 via TOPICAL

## 2014-05-22 MED ORDER — ALBUTEROL SULFATE HFA 108 (90 BASE) MCG/ACT IN AERS: 2.0000 | INHALATION_SPRAY | Freq: Two times a day (BID) | RESPIRATORY_TRACT | Status: DC | PRN

## 2014-05-22 MED ORDER — METFORMIN HCL 500 MG PO TABS: 1000.0000 mg | ORAL_TABLET | Freq: Two times a day (BID) | ORAL | Status: DC

## 2014-05-22 MED ORDER — FERROUS SULFATE 325 (65 FE) MG PO TABS
325.0000 mg | ORAL_TABLET | Freq: Three times a day (TID) | ORAL | Status: DC
  Administered 2014-05-23 – 2014-05-24 (×4): 325 mg via ORAL
  Filled 2014-05-22 (×7): qty 1

## 2014-05-22 MED ORDER — BISOPROLOL FUMARATE 5 MG PO TABS
2.5000 mg | ORAL_TABLET | Freq: Once | ORAL | Status: AC
  Administered 2014-05-22: 2.5 mg via ORAL
  Filled 2014-05-22: qty 0.5

## 2014-05-22 MED ORDER — VANCOMYCIN HCL IN DEXTROSE 1-5 GM/200ML-% IV SOLN
INTRAVENOUS | Status: AC
  Filled 2014-05-22: qty 200

## 2014-05-22 MED ORDER — SODIUM CHLORIDE 0.9 % IR SOLN
Status: DC | PRN
  Administered 2014-05-22: 1000 mL

## 2014-05-22 MED ORDER — CHLORHEXIDINE GLUCONATE 4 % EX LIQD: 60.0000 mL | Freq: Once | CUTANEOUS | Status: DC

## 2014-05-22 MED ORDER — FLUTICASONE PROPIONATE 50 MCG/ACT NA SUSP
1.0000 | Freq: Every day | NASAL | Status: DC
  Administered 2014-05-23 – 2014-05-24 (×2): 1 via NASAL
  Filled 2014-05-22: qty 16

## 2014-05-22 MED ORDER — BUPIVACAINE HCL (PF) 0.25 % IJ SOLN
INTRAMUSCULAR | Status: DC | PRN
  Administered 2014-05-22: 20 mL

## 2014-05-22 MED ORDER — BISACODYL 5 MG PO TBEC: 5.0000 mg | DELAYED_RELEASE_TABLET | Freq: Every day | ORAL | Status: DC | PRN

## 2014-05-22 MED ORDER — PHENYLEPHRINE HCL 10 MG/ML IJ SOLN
10.0000 mg | INTRAVENOUS | Status: DC | PRN
  Administered 2014-05-22: 40 ug/min via INTRAVENOUS

## 2014-05-22 MED ORDER — LACTATED RINGERS IV SOLN
INTRAVENOUS | Status: DC
  Administered 2014-05-22: via INTRAVENOUS

## 2014-05-22 MED ORDER — MEPERIDINE HCL 50 MG/ML IJ SOLN: 6.2500 mg | INTRAMUSCULAR | Status: DC | PRN

## 2014-05-22 MED ORDER — POLYETHYLENE GLYCOL 3350 17 G PO PACK: 17.0000 g | PACK | Freq: Every day | ORAL | Status: DC | PRN

## 2014-05-22 MED ORDER — ROSUVASTATIN CALCIUM 40 MG PO TABS
40.0000 mg | ORAL_TABLET | Freq: Every day | ORAL | Status: DC
  Administered 2014-05-22 – 2014-05-23 (×2): 40 mg via ORAL
  Filled 2014-05-22 (×3): qty 1

## 2014-05-22 MED ORDER — MOMETASONE FURO-FORMOTEROL FUM 100-5 MCG/ACT IN AERO
2.0000 | INHALATION_SPRAY | Freq: Two times a day (BID) | RESPIRATORY_TRACT | Status: DC
  Filled 2014-05-22: qty 8.8

## 2014-05-22 MED ORDER — DEXAMETHASONE SODIUM PHOSPHATE 10 MG/ML IJ SOLN
INTRAMUSCULAR | Status: AC
  Filled 2014-05-22: qty 1

## 2014-05-22 MED ORDER — DEXAMETHASONE SODIUM PHOSPHATE 10 MG/ML IJ SOLN
INTRAMUSCULAR | Status: DC | PRN
  Administered 2014-05-22: 10 mg via INTRAVENOUS

## 2014-05-22 MED ORDER — ACETAMINOPHEN 325 MG PO TABS
650.0000 mg | ORAL_TABLET | Freq: Four times a day (QID) | ORAL | Status: DC | PRN
Start: 2014-05-22 — End: 2014-05-24

## 2014-05-22 SURGICAL SUPPLY — 72 items
BAG DECANTER FOR FLEXI CONT (MISCELLANEOUS) ×2 IMPLANT
BAG ZIPLOCK 12X15 (MISCELLANEOUS) IMPLANT
BANDAGE ELASTIC 4 VELCRO ST LF (GAUZE/BANDAGES/DRESSINGS) ×2 IMPLANT
BANDAGE ELASTIC 6 VELCRO ST LF (GAUZE/BANDAGES/DRESSINGS) ×2 IMPLANT
BANDAGE ESMARK 6X9 LF (GAUZE/BANDAGES/DRESSINGS) ×1 IMPLANT
BLADE SAG 18X100X1.27 (BLADE) ×2 IMPLANT
BLADE SAW SGTL 11.0X1.19X90.0M (BLADE) ×2 IMPLANT
BNDG ESMARK 6X9 LF (GAUZE/BANDAGES/DRESSINGS) ×2
BONE CEMENT GENTAMICIN (Cement) ×4 IMPLANT
CAP KNEE TOTAL 3 SIGMA ×2 IMPLANT
CATH FOLEY LATEX FREE 16FR (CATHETERS) ×2 IMPLANT
CEMENT BONE GENTAMICIN 40 (Cement) ×2 IMPLANT
CUFF TOURN SGL QUICK 34 (TOURNIQUET CUFF) ×1
CUFF TRNQT CYL 34X4X40X1 (TOURNIQUET CUFF) ×1 IMPLANT
DRAPE EXTREMITY T 121X128X90 (DRAPE) ×2 IMPLANT
DRAPE INCISE IOBAN 66X45 STRL (DRAPES) IMPLANT
DRAPE POUCH INSTRU U-SHP 10X18 (DRAPES) ×2 IMPLANT
DRAPE U-SHAPE 47X51 STRL (DRAPES) ×2 IMPLANT
DRSG AQUACEL AG ADV 3.5X10 (GAUZE/BANDAGES/DRESSINGS) ×2 IMPLANT
DRSG PAD ABDOMINAL 8X10 ST (GAUZE/BANDAGES/DRESSINGS) IMPLANT
DRSG TEGADERM 4X4.75 (GAUZE/BANDAGES/DRESSINGS) ×2 IMPLANT
DURAPREP 26ML APPLICATOR (WOUND CARE) ×2 IMPLANT
ELECT REM PT RETURN 9FT ADLT (ELECTROSURGICAL) ×2
ELECTRODE REM PT RTRN 9FT ADLT (ELECTROSURGICAL) ×1 IMPLANT
EVACUATOR 1/8 PVC DRAIN (DRAIN) ×2 IMPLANT
FACESHIELD WRAPAROUND (MASK) ×10 IMPLANT
GAUZE SPONGE 2X2 8PLY STRL LF (GAUZE/BANDAGES/DRESSINGS) ×1 IMPLANT
GLOVE BIOGEL PI IND STRL 6.5 (GLOVE) ×1 IMPLANT
GLOVE BIOGEL PI IND STRL 8 (GLOVE) ×1 IMPLANT
GLOVE BIOGEL PI INDICATOR 6.5 (GLOVE) ×1
GLOVE BIOGEL PI INDICATOR 8 (GLOVE) ×1
GLOVE ECLIPSE 8.0 STRL XLNG CF (GLOVE) ×4 IMPLANT
GLOVE SURG SS PI 6.5 STRL IVOR (GLOVE) ×2 IMPLANT
GOWN STRL REUS W/TWL LRG LVL3 (GOWN DISPOSABLE) ×2 IMPLANT
GOWN STRL REUS W/TWL XL LVL3 (GOWN DISPOSABLE) ×2 IMPLANT
HANDPIECE INTERPULSE COAX TIP (DISPOSABLE) ×1
IMMOBILIZER KNEE 20 (SOFTGOODS) ×2
IMMOBILIZER KNEE 20 THIGH 36 (SOFTGOODS) ×1 IMPLANT
KIT BASIN OR (CUSTOM PROCEDURE TRAY) ×2 IMPLANT
LIQUID BAND (GAUZE/BANDAGES/DRESSINGS) ×2 IMPLANT
MANIFOLD NEPTUNE II (INSTRUMENTS) ×2 IMPLANT
NDL SAFETY ECLIPSE 18X1.5 (NEEDLE) IMPLANT
NEEDLE HYPO 18GX1.5 SHARP (NEEDLE)
NEEDLE HYPO 22GX1.5 SAFETY (NEEDLE) ×4 IMPLANT
NS IRRIG 1000ML POUR BTL (IV SOLUTION) IMPLANT
PACK TOTAL JOINT (CUSTOM PROCEDURE TRAY) ×2 IMPLANT
PADDING CAST COTTON 6X4 STRL (CAST SUPPLIES) IMPLANT
PEN SKIN MARKING BROAD (MISCELLANEOUS) ×2 IMPLANT
POSITIONER SURGICAL ARM (MISCELLANEOUS) ×2 IMPLANT
SET HNDPC FAN SPRY TIP SCT (DISPOSABLE) ×1 IMPLANT
SET PAD KNEE POSITIONER (MISCELLANEOUS) ×2 IMPLANT
SPONGE GAUZE 2X2 STER 10/PKG (GAUZE/BANDAGES/DRESSINGS) ×1
SPONGE LAP 18X18 X RAY DECT (DISPOSABLE) IMPLANT
SPONGE SURGIFOAM ABS GEL 100 (HEMOSTASIS) ×2 IMPLANT
STAPLER VISISTAT 35W (STAPLE) IMPLANT
SUCTION FRAZIER 12FR DISP (SUCTIONS) ×2 IMPLANT
SUT BONE WAX W31G (SUTURE) ×2 IMPLANT
SUT MNCRL AB 4-0 PS2 18 (SUTURE) ×2 IMPLANT
SUT VIC AB 1 CT1 27 (SUTURE) ×2
SUT VIC AB 1 CT1 27XBRD ANTBC (SUTURE) ×2 IMPLANT
SUT VIC AB 2-0 CT1 27 (SUTURE) ×3
SUT VIC AB 2-0 CT1 TAPERPNT 27 (SUTURE) ×3 IMPLANT
SUT VLOC 180 0 24IN GS25 (SUTURE) ×2 IMPLANT
SYR 20CC LL (SYRINGE) ×4 IMPLANT
SYR 50ML LL SCALE MARK (SYRINGE) ×2 IMPLANT
TOWEL OR 17X26 10 PK STRL BLUE (TOWEL DISPOSABLE) ×2 IMPLANT
TOWEL OR NON WOVEN STRL DISP B (DISPOSABLE) IMPLANT
TOWER CARTRIDGE SMART MIX (DISPOSABLE) ×2 IMPLANT
TRAY FOLEY CATH 14FRSI W/METER (CATHETERS) ×2 IMPLANT
WATER STERILE IRR 1500ML POUR (IV SOLUTION) ×2 IMPLANT
WRAP KNEE MAXI GEL POST OP (GAUZE/BANDAGES/DRESSINGS) ×2 IMPLANT
YANKAUER SUCT BULB TIP 10FT TU (MISCELLANEOUS) ×2 IMPLANT

## 2014-05-22 NOTE — Anesthesia Preprocedure Evaluation (Addendum)
Anesthesia Evaluation  Patient identified by MRN, date of birth, ID band Patient awake    Reviewed: Allergy & Precautions, NPO status , Patient's Chart, lab work & pertinent test results, reviewed documented beta blocker date and time   History of Anesthesia Complications (+) history of anesthetic complications  Airway Mallampati: II  TM Distance: >3 FB Neck ROM: Full    Dental no notable dental hx. (+) Teeth Intact   Pulmonary asthma , COPD COPD inhaler, former smoker,  breath sounds clear to auscultation  Pulmonary exam normal       Cardiovascular hypertension, Pt. on medications and Pt. on home beta blockers + CAD and + Peripheral Vascular Disease Rhythm:Regular Rate:Normal     Neuro/Psych  Headaches, PSYCHIATRIC DISORDERS    GI/Hepatic GERD-  Medicated and Controlled,  Endo/Other  diabetes, Poorly Controlled, Type 2, Oral Hypoglycemic AgentsObesity  Renal/GU   negative genitourinary   Musculoskeletal  (+) Arthritis -, Osteoarthritis,  Left knee   Abdominal (+) + obese,   Peds  Hematology   Anesthesia Other Findings   Reproductive/Obstetrics negative OB ROS                           Anesthesia Physical Anesthesia Plan  ASA: III  Anesthesia Plan: General   Post-op Pain Management:    Induction: Intravenous  Airway Management Planned: Oral ETT  Additional Equipment:   Intra-op Plan:   Post-operative Plan: Extubation in OR  Informed Consent: I have reviewed the patients History and Physical, chart, labs and discussed the procedure including the risks, benefits and alternatives for the proposed anesthesia with the patient or authorized representative who has indicated his/her understanding and acceptance.   Dental advisory given  Plan Discussed with: Anesthesiologist, CRNA and Surgeon  Anesthesia Plan Comments:         Anesthesia Quick Evaluation

## 2014-05-22 NOTE — Progress Notes (Signed)
PHARMACIST - PHYSICIAN COMMUNICATION DR:  Darrelyn HillockGIOFFRE CONCERNING:  METFORMIN SAFE ADMINISTRATION POLICY  RECOMMENDATION: Metformin has been placed on DISCONTINUE (rejected order) STATUS and should be reordered only after any of the conditions below are ruled out.  Current safety recommendations include avoiding metformin for a minimum of 48 hours after the patient's exposure to intravenous contrast media.  DESCRIPTION:  The Pharmacy Committee has adopted a policy that restricts the use of metformin in hospitalized patients until all the contraindications to administration have been ruled out. Specific contraindications are: []  Serum creatinine ? 1.5 for males [x]  Serum creatinine ? 1.4 for females []  Shock, acute MI, sepsis, hypoxemia, dehydration []  Planned administration of intravenous iodinated contrast media []  Heart Failure patients with low EF []  Acute or chronic metabolic acidosis (including DKA)    SCr 1.41 per most current BMET from 05/13/14. Will f/u BMET ordered for 05/23/14 to assess if metformin may be resumed.   Leslie PickerelJigna Yunior [name], PharmD, BCPS Pager: [phone] 05/22/2014 5:06 PM

## 2014-05-22 NOTE — Interval H&P Note (Signed)
History and Physical Interval Note:  05/22/2014 12:32 PM  [name]  has presented today for surgery, with the diagnosis of left knee osteoarthritis  The various methods of treatment have been discussed with the patient and family. After consideration of risks, benefits and other options for treatment, the patient has consented to  Procedure(s): LEFT TOTAL KNEE ARTHROPLASTY (Left) as a surgical intervention .  The patient's history has been reviewed, patient examined, no change in status, stable for surgery.  I have reviewed the patient's chart and labs.  Questions were answered to the patient's satisfaction.     [name]

## 2014-05-22 NOTE — Anesthesia Postprocedure Evaluation (Signed)
  Anesthesia Post-op Note  Patient: [name]  Procedure(s) Performed: Procedure(s): LEFT TOTAL KNEE ARTHROPLASTY (Left)  Patient Location: PACU  Anesthesia Type:General  Level of Consciousness: awake, alert  and oriented  Airway and Oxygen Therapy: Patient Spontanous Breathing and Patient connected to nasal cannula oxygen  Post-op Pain: mild   Post-op Assessment: Post-op Vital signs reviewed, Patient's Cardiovascular Status Stable, Respiratory Function Stable, Patent Airway, No signs of Nausea or vomiting and Pain level controlled  Post-op Vital Signs: Reviewed and stable  Last Vitals:  Filed Vitals:   05/22/14 1615  BP: 140/73  Pulse: 83  Temp: 36.4 C  Resp: 17    Complications: No apparent anesthesia complications

## 2014-05-22 NOTE — Interval H&P Note (Signed)
History and Physical Interval Note:  05/22/2014 1:00 PM  [name]  has presented today for surgery, with the diagnosis of left knee osteoarthritis  The various methods of treatment have been discussed with the patient and family. After consideration of risks, benefits and other options for treatment, the patient has consented to  Procedure(s): LEFT TOTAL KNEE ARTHROPLASTY (Left) as a surgical intervention .  The patient's history has been reviewed, patient examined, no change in status, stable for surgery.  I have reviewed the patient's chart and labs.  Questions were answered to the patient's satisfaction.     Raydon Chappuis A

## 2014-05-22 NOTE — Brief Op Note (Signed)
05/22/2014  2:52 PM  PATIENT:  [name]  59 y.o. female  PRE-OPERATIVE DIAGNOSIS:  primary left knee osteoarthritis  POST-OPERATIVE DIAGNOSIS:  primary left knee osteoarthritis  PROCEDURE:  Procedure(s): LEFT TOTAL KNEE ARTHROPLASTY (Left)  SURGEON:  Surgeon(s) and Role:    * Ranee Gosselinonald Forest Pruden, MD - Primary  PHYSICIAN ASSISTANT: Dimitri PedAmber Constable PA  ASSISTANTS:Amber Uniontownonstable PA  ANESTHESIA:   general  EBL:  Total I/O In: 1000 [I.V.:1000] Out: 100 [Urine:100]  BLOOD ADMINISTERED:none  DRAINS: (One) Hemovact drain(s) in the Left Knee with  Suction Open   LOCAL MEDICATIONS USED:  MARCAINE 20cc of 0.25% plain and Exparel 20cc mixed with 20cc of Normal Saline    SPECIMEN:  No Specimen  DISPOSITION OF SPECIMEN:  N/A  COUNTS:  YES  TOURNIQUET:  * Missing tourniquet times found for documented tourniquets in log:  [id number] *  DICTATION: .Other Dictation: Dictation Number [id number]  PLAN OF CARE: Admit to inpatient   PATIENT DISPOSITION:  Stable in OR   Delay start of Pharmacological VTE agent (>24hrs) due to surgical blood loss or risk of bleeding: yes

## 2014-05-22 NOTE — Transfer of Care (Signed)
Immediate Anesthesia Transfer of Care Note  Patient: [name]  Procedure(s) Performed: Procedure(s): LEFT TOTAL KNEE ARTHROPLASTY (Left)  Patient Location: PACU  Anesthesia Type:General  Level of Consciousness: awake, alert , oriented, patient cooperative and responds to stimulation  Airway & Oxygen Therapy: Patient Spontanous Breathing and Patient connected to face mask oxygen  Post-op Assessment: Report given to RN, Post -op Vital signs reviewed and stable and Patient moving all extremities  Post vital signs: Reviewed and stable  Last Vitals:  Filed Vitals:   05/22/14 1041  BP: 130/87  Pulse: 89  Temp: 36.4 C  Resp: 18    Complications: No apparent anesthesia complications

## 2014-05-22 NOTE — Anesthesia Procedure Notes (Signed)
Procedure Name: Intubation Date/Time: 05/22/2014 1:10 PM Performed by: Edison PaceGRAY, Jahmeer Porche E Pre-anesthesia Checklist: Patient identified, Timeout performed, Emergency Drugs available, Suction available and Patient being monitored Patient Re-evaluated:Patient Re-evaluated prior to inductionOxygen Delivery Method: Circle system utilized Preoxygenation: Pre-oxygenation with 100% oxygen Intubation Type: IV induction and Cricoid Pressure applied Ventilation: Mask ventilation without difficulty Laryngoscope Size: Mac and 4 Grade View: Grade I Tube type: Oral Tube size: 7.5 mm Number of attempts: 1 Airway Equipment and Method: Stylet Secured at: 21 cm Tube secured with: Tape Dental Injury: Teeth and Oropharynx as per pre-operative assessment

## 2014-05-23 ENCOUNTER — Encounter (HOSPITAL_COMMUNITY): Payer: Self-pay | Admitting: Orthopedic Surgery

## 2014-05-23 LAB — BASIC METABOLIC PANEL
Anion gap: 11 (ref 5–15)
BUN: 18 mg/dL (ref 6–23)
CALCIUM: 9.1 mg/dL (ref 8.4–10.5)
CO2: 23 mmol/L (ref 19–32)
Chloride: 105 mmol/L (ref 96–112)
Creatinine, Ser: 0.78 mg/dL (ref 0.50–1.10)
GFR calc non Af Amer: >90 mL/min (ref >90)
Glucose, Bld: 159 mg/dL — ABNORMAL HIGH (ref 70–99)
Potassium: 4.3 mmol/L (ref 3.5–5.1)
SODIUM: 139 mmol/L (ref 135–145)

## 2014-05-23 LAB — GLUCOSE, CAPILLARY
GLUCOSE-CAPILLARY: 113 mg/dL — AB (ref 70–99)
GLUCOSE-CAPILLARY: 192 mg/dL — AB (ref 70–99)
Glucose-Capillary: 201 mg/dL — ABNORMAL HIGH (ref 70–99)
Glucose-Capillary: 182 mg/dL — ABNORMAL HIGH (ref 70–99)

## 2014-05-23 LAB — CBC
HCT: 37 % (ref 36.0–46.0)
Hemoglobin: 11.5 g/dL — ABNORMAL LOW (ref 12.0–15.0)
MCH: 27.9 pg (ref 26.0–34.0)
MCHC: 31.1 g/dL (ref 30.0–36.0)
MCV: 89.8 fL (ref 78.0–100.0)
PLATELETS: 291 10*3/uL (ref 150–400)
RBC: 4.12 MIL/uL (ref 3.87–5.11)
RDW: 14 % (ref 11.5–15.5)
WBC: 7.3 10*3/uL (ref 4.0–10.5)

## 2014-05-23 MED ORDER — NON FORMULARY: 1.0000 | Freq: Two times a day (BID) | Status: DC

## 2014-05-23 MED ORDER — FLUTICASONE-SALMETEROL 250-50 MCG/DOSE IN AEPB
1.0000 | INHALATION_SPRAY | Freq: Two times a day (BID) | RESPIRATORY_TRACT | Status: DC
  Administered 2014-05-23 – 2014-05-24 (×3): 1 via RESPIRATORY_TRACT

## 2014-05-23 MED ORDER — METHOCARBAMOL 500 MG PO TABS: 500.0000 mg | ORAL_TABLET | Freq: Four times a day (QID) | ORAL | Status: DC | PRN

## 2014-05-23 MED ORDER — METFORMIN HCL 500 MG PO TABS
1000.0000 mg | ORAL_TABLET | Freq: Two times a day (BID) | ORAL | Status: DC
  Administered 2014-05-23 – 2014-05-24 (×2): 1000 mg via ORAL
  Filled 2014-05-23 (×4): qty 2

## 2014-05-23 MED ORDER — FERROUS SULFATE 325 (65 FE) MG PO TABS: 325.0000 mg | ORAL_TABLET | Freq: Three times a day (TID) | ORAL | Status: DC

## 2014-05-23 MED ORDER — OXYCODONE-ACETAMINOPHEN 5-325 MG PO TABS: 1.0000 | ORAL_TABLET | ORAL | Status: DC | PRN

## 2014-05-23 MED ORDER — DOCUSATE SODIUM 100 MG PO CAPS: 100.0000 mg | ORAL_CAPSULE | Freq: Two times a day (BID) | ORAL | Status: DC

## 2014-05-23 MED ORDER — DOCUSATE SODIUM 100 MG PO CAPS
100.0000 mg | ORAL_CAPSULE | Freq: Two times a day (BID) | ORAL | Status: DC
  Administered 2014-05-23 – 2014-05-24 (×3): 100 mg via ORAL

## 2014-05-23 NOTE — Progress Notes (Addendum)
Subjective: 1 Day Post-Op Procedure(s) (LRB): LEFT TOTAL KNEE ARTHROPLASTY (Left) Patient reports pain as 3 on 0-10 scale.Hemovac DCd.No problems today.    Objective: Vital signs in last 24 hours: Temp:  [97.5 F (36.4 C)-98.4 F (36.9 C)] 98.2 F (36.8 C) (04/08 0432) Pulse Rate:  [75-99] 81 (04/08 0432) Resp:  [11-18] 16 (04/08 0432) BP: (130-161)/(69-88) 151/74 mmHg (04/08 0432) SpO2:  [96 %-100 %] 96 % (04/08 0432) Weight:  [82.555 kg (182 lb)] 82.555 kg (182 lb) (04/07 1041)  Intake/Output from previous day: 04/07 0701 - 04/08 0700 In: 3305 [P.O.:480; I.V.:2675; IV Piggyback:150] Out: 3600 [Urine:3500; Drains:100] Intake/Output this shift:     Recent Labs  05/23/14 0455  HGB 11.5*    Recent Labs  05/23/14 0455  WBC 7.3  RBC 4.12  HCT 37.0  PLT 291    Recent Labs  05/23/14 0455  NA 139  K 4.3  CL 105  CO2 23  BUN 18  CREATININE 0.78  GLUCOSE 159*  CALCIUM 9.1   No results for input(s): LABPT, INR in the last 72 hours.  Neurovascular intact No cellulitis present Compartment soft  Assessment/Plan: 1 Day Post-Op Procedure(s) (LRB): LEFT TOTAL KNEE ARTHROPLASTY (Left) Up with therapy.DC Saturday  Jullien Granquist A 05/23/2014, 7:18 AM

## 2014-05-23 NOTE — Discharge Instructions (Addendum)
INSTRUCTIONS AFTER JOINT REPLACEMENT  ° °o Remove items at home which could result in a fall. This includes throw rugs or furniture in walking pathways °o ICE to the affected joint every three hours while awake for 30 minutes at a time, for at least the first 3-5 days, and then as needed for pain and swelling.  Continue to use ice for pain and swelling. You may notice swelling that will progress down to the foot and ankle.  This is normal after surgery.  Elevate your leg when you are not up walking on it.   °o Continue to use the breathing machine you got in the hospital (incentive spirometer) which will help keep your temperature down.  It is common for your temperature to cycle up and down following surgery, especially at night when you are not up moving around and exerting yourself.  The breathing machine keeps your lungs expanded and your temperature down. ° ° °DIET:  As you were doing prior to hospitalization, we recommend a well-balanced diet. ° °DRESSING / WOUND CARE / SHOWERING ° °Keep the surgical dressing until follow up.  The dressing is water proof, so you can shower without any extra covering.  IF THE DRESSING FALLS OFF or the wound gets wet inside, change the dressing with sterile gauze.  Please use good hand washing techniques before changing the dressing.  Do not use any lotions or creams on the incision until instructed by your surgeon.   ° °ACTIVITY ° °o Increase activity slowly as tolerated, but follow the weight bearing instructions below.   °o No driving for 6 weeks or until further direction given by your physician.  You cannot drive while taking narcotics.  °o No lifting or carrying greater than 10 lbs. until further directed by your surgeon. °o Avoid periods of inactivity such as sitting longer than an hour when not asleep. This helps prevent blood clots.  °o You may return to work once you are authorized by your doctor.  ° ° ° °WEIGHT BEARING  ° °Weight bearing as tolerated with assist  device (walker, cane, etc) as directed, use it as long as suggested by your surgeon or therapist, typically at least 4-6 weeks. ° ° °EXERCISES ° °Results after joint replacement surgery are often greatly improved when you follow the exercise, range of motion and muscle strengthening exercises prescribed by your doctor. Safety measures are also important to protect the joint from further injury. Any time any of these exercises cause you to have increased pain or swelling, decrease what you are doing until you are comfortable again and then slowly increase them. If you have problems or questions, call your caregiver or physical therapist for advice.  ° °Rehabilitation is important following a joint replacement. After just a few days of immobilization, the muscles of the leg can become weakened and shrink (atrophy).  These exercises are designed to build up the tone and strength of the thigh and leg muscles and to improve motion. Often times heat used for twenty to thirty minutes before working out will loosen up your tissues and help with improving the range of motion but do not use heat for the first two weeks following surgery (sometimes heat can increase post-operative swelling).  ° °These exercises can be done on a training (exercise) mat, on the floor, on a table or on a bed. Use whatever works the best and is most comfortable for you.    Use music or television while you are exercising so that   the exercises are a pleasant break in your day. This will make your life better with the exercises acting as a break in your routine that you can look forward to.   Perform all exercises about fifteen times, three times per day or as directed.  You should exercise both the operative leg and the other leg as well. ° °Exercises include: °  °• Quad Sets - Tighten up the muscle on the front of the thigh (Quad) and hold for 5-10 seconds.   °• Straight Leg Raises - With your knee straight (if you were given a brace, keep it on),  lift the leg to 60 degrees, hold for 3 seconds, and slowly lower the leg.  Perform this exercise against resistance later as your leg gets stronger.  °• Leg Slides: Lying on your back, slowly slide your foot toward your buttocks, bending your knee up off the floor (only go as far as is comfortable). Then slowly slide your foot back down until your leg is flat on the floor again.  °• Angel Wings: Lying on your back spread your legs to the side as far apart as you can without causing discomfort.  °• Hamstring Strength:  Lying on your back, push your heel against the floor with your leg straight by tightening up the muscles of your buttocks.  Repeat, but this time bend your knee to a comfortable angle, and push your heel against the floor.  You may put a pillow under the heel to make it more comfortable if necessary.  ° °A rehabilitation program following joint replacement surgery can speed recovery and prevent re-injury in the future due to weakened muscles. Contact your doctor or a physical therapist for more information on knee rehabilitation.  ° ° °CONSTIPATION ° °Constipation is defined medically as fewer than three stools per week and severe constipation as less than one stool per week.  Even if you have a regular bowel pattern at home, your normal regimen is likely to be disrupted due to multiple reasons following surgery.  Combination of anesthesia, postoperative narcotics, change in appetite and fluid intake all can affect your bowels.  ° °YOU MUST use at least one of the following options; they are listed in order of increasing strength to get the job done.  They are all available over the counter, and you may need to use some, POSSIBLY even all of these options:   ° °Drink plenty of fluids (prune juice may be helpful) and high fiber foods °Colace 100 mg by mouth twice a day  °Senokot for constipation as directed and as needed Dulcolax (bisacodyl), take with full glass of water  °Miralax (polyethylene glycol)  once or twice a day as needed. ° °If you have tried all these things and are unable to have a bowel movement in the first 3-4 days after surgery call either your surgeon or your primary doctor.   ° °If you experience loose stools or diarrhea, hold the medications until you stool forms back up.  If your symptoms do not get better within 1 week or if they get worse, check with your doctor.  If you experience "the worst abdominal pain ever" or develop nausea or vomiting, please contact the office immediately for further recommendations for treatment. ° ° °ITCHING:  If you experience itching with your medications, try taking only a single pain pill, or even half a pain pill at a time.  You can also use Benadryl over the counter for itching or also to   help with sleep.   TED HOSE STOCKINGS:  Use stockings on both legs until for at least 2 weeks or as directed by physician office. They may be removed at night for sleeping.  MEDICATIONS:  See your medication summary on the After Visit Summary that nursing will review with you.  You may have some home medications which will be placed on hold until you complete the course of blood thinner medication.  It is important for you to complete the blood thinner medication as prescribed.  PRECAUTIONS:  If you experience chest pain or shortness of breath - call 911 immediately for transfer to the hospital emergency department.   If you develop a fever greater that 101 F, purulent drainage from wound, increased redness or drainage from wound, foul odor from the wound/dressing, or calf pain - CONTACT YOUR SURGEON.                                                   FOLLOW-UP APPOINTMENTS:  If you do not already have a post-op appointment, please call the office for an appointment to be seen by your surgeon.  Guidelines for how soon to be seen are listed in your After Visit Summary, but are typically between 1-4 weeks after surgery.   DVT PROPHYLAXIS: In order to prevent  blood clots, please resume your Plavix and aspirin regimen once you return home.    MAKE SURE YOU:   Understand these instructions.   Get help right away if you are not doing well or get worse.    Thank you for letting us be a part of your medical care team.  It is a privilege we respect greatly.  We hope these instructions will help you stay on track for a fast and full recovery!  Information on my medicine - XARELTO (Rivaroxaban)  This medication education was reviewed with me or my healthcare representative as part of my discharge preparation.  The pharmacist that spoke with me during my hospital stay was:  Maurice MarchJackson, Amera Banos E, [facility]  Why was Xarelto prescribed for you? Xarelto was prescribed for you to reduce the risk of blood clots forming after orthopedic surgery. The medical term for these abnormal blood clots is venous thromboembolism (VTE).  What do you need to know about xarelto ? Take your Xarelto ONCE DAILY at the same time every day. You may take it either with or without food.  If you have difficulty swallowing the tablet whole, you may crush it and mix in applesauce just prior to taking your dose.  Take Xarelto exactly as prescribed by your doctor and DO NOT stop taking Xarelto without talking to the doctor who prescribed the medication.  Stopping without other VTE prevention medication to take the place of Xarelto may increase your risk of developing a clot.  After discharge, you should have regular check-up appointments with your healthcare provider that is prescribing your Xarelto.    What do you do if you miss a dose? If you miss a dose, take it as soon as you remember on the same day then continue your regularly scheduled once daily regimen the next day. Do not take two doses of Xarelto on the same day.   Important Safety Information A possible side effect of Xarelto is bleeding. You should call your healthcare provider right away if you experience any  of  the following: ? Bleeding from an injury or your nose that does not stop. ? Unusual colored urine (red or dark brown) or unusual colored stools (red or black). ? Unusual bruising for unknown reasons. ? A serious fall or if you hit your head (even if there is no bleeding).  Some medicines may interact with Xarelto and might increase your risk of bleeding while on Xarelto. To help avoid this, consult your healthcare provider or pharmacist prior to using any new prescription or non-prescription medications, including herbals, vitamins, non-steroidal anti-inflammatory drugs (NSAIDs) and supplements.  This website has more information on Xarelto: VisitDestination.com.br.

## 2014-05-23 NOTE — Progress Notes (Signed)
Physical Therapy Treatment Patient Details Name: Leslie KluverRoxie [name] MRN: [id number] DOB: 05/19/1955 Today's Date: 05/23/2014    History of Present Illness L TKR; s/p R TKR 2011    PT Comments    Pt cooperative and motivated but pain ltd.  Follow Up Recommendations  Home health PT     Equipment Recommendations  None recommended by PT    Recommendations for Other Services OT consult     Precautions / Restrictions Precautions Precautions: Knee;Fall Required Braces or Orthoses: Knee Immobilizer - Left Knee Immobilizer - Left: Discontinue once straight leg raise with < 10 degree lag Restrictions Weight Bearing Restrictions: No LLE Weight Bearing: Weight bearing as tolerated    Mobility  Bed Mobility Overal bed mobility: Needs Assistance Bed Mobility: Sit to Supine     Supine to sit: Min assist Sit to supine: Min assist   General bed mobility comments: OOB deferred at pt request - just back from bathroom with OT  Transfers Overall transfer level: Needs assistance Equipment used: Rolling walker (2 wheeled) Transfers: Sit to/from [employer]GI CorporationStand;Stand Pivot Transfers Sit to Stand: Min guard Stand pivot transfers: Min guard       General transfer comment: cues for LE management and use of UEs to self assist  Ambulation/Gait Ambulation/Gait assistance: Min assist Ambulation Distance (Feet): 45 Feet (and 20' to bathroom) Assistive device: Rolling walker (2 wheeled) Gait Pattern/deviations: Step-to pattern;Decreased step length - right;Decreased step length - left;Shuffle;Trunk flexed     General Gait Details: cues for sequence, posture and position from Rohm and HaasW   Stairs            Wheelchair Mobility    Modified Rankin (Stroke Patients Only)       Balance                                    Cognition Arousal/Alertness: Awake/alert Behavior During Therapy: WFL for tasks assessed/performed Overall Cognitive Status: Within Functional Limits for tasks  assessed                      Exercises Total Joint Exercises Ankle Circles/Pumps: AROM;Both;15 reps;Supine Quad Sets: AROM;Both;10 reps;Supine Heel Slides: AAROM;10 reps;Supine;Left Straight Leg Raises: AAROM;Left;10 reps;Supine Goniometric ROM: AAROM L knee -10- 45    General Comments        Pertinent Vitals/Pain Pain Assessment: 0-10 Pain Score: 8  Pain Location: L knee Pain Descriptors / Indicators: Aching;Sore Pain Intervention(s): Limited activity within patient's tolerance;Monitored during session;Premedicated before session;Ice applied    Home Living Family/patient expects to be discharged to:: Private residence Living Arrangements: Spouse/significant other Available Help at Discharge: Family Type of Home: House Home Access: Stairs to enter Entrance Stairs-Rails: Right;Left;Can reach both Home Layout: One level Home Equipment: Environmental consultantWalker - 2 wheels;Bedside commode      Prior Function Level of Independence: Independent          PT Goals (current goals can now be found in the care plan section) Acute Rehab PT Goals Patient Stated Goal: to regain good function of Lt. knee  PT Goal Formulation: With patient Time For Goal Achievement: 05/30/14 Potential to Achieve Goals: Good Progress towards PT goals: Progressing toward goals    Frequency  7X/week    PT Plan Current plan remains appropriate    Co-evaluation             End of Session Equipment Utilized During Treatment: Gait belt;Left knee immobilizer Activity Tolerance:  Patient tolerated treatment well;Patient limited by pain Patient left: in bed;with call bell/phone within reach     Time: 1310-1331 PT Time Calculation (min) (ACUTE ONLY): 21 min  Charges:  $Gait Training: 8-22 mins $Therapeutic Exercise: 8-22 mins                    G Codes:      Nishant Schrecengost 08-Jun-2014, 2:09 PM

## 2014-05-23 NOTE — Progress Notes (Signed)
CARE MANAGEMENT NOTE 05/23/2014  Patient:  [name],[name]   Account Number:  [id number]  Date Initiated:  05/23/2014  Documentation initiated by:  Ferdinand CavaSCHETTINO,Priscille Shadduck  Subjective/Objective Assessment:   59 yo female admitted with TKA from home     Action/Plan:   discharge planning   Anticipated DC Date:  05/23/2014   Anticipated DC Plan:  HOME W HOME HEALTH SERVICES      DC Planning Services  CM consult      [facility]  [facility]   Choice offered to / List presented to:  C-1 Patient        HH arranged  HH-2 PT      [facility]   Status of service:  Completed, signed off Medicare Important Message given?   (If response is "NO", the following Medicare IM given date fields will be blank) Date Medicare IM given:   Medicare IM given by:   Date Additional Medicare IM given:   Additional Medicare IM given by:    Discharge Disposition:  HOME W HOME HEALTH SERVICES  Per UR Regulation:    If discussed at Long Length of Stay Meetings, dates discussed:    Comments:  05/23/14 Ferdinand CavaAndrea Schettino RN BSN CM [phone] [facility] the [facility]. She already has a rolling walker and a 3in1 at home. Tim[facility] made aware of referral.

## 2014-05-23 NOTE — Plan of Care (Signed)
Problem: Consults Goal: Diagnosis- Total Joint Replacement Outcome: Completed/Met Date Met:  05/23/14 Primary Total Knee LEFT

## 2014-05-23 NOTE — Progress Notes (Signed)
Occupational Therapy Treatment Patient Details Name: [name] MRN: [id number] DOB: Mar 05, 1955 Today's Date: [name]    History of present illness L TKR; s/p R TKR 2011   OT comments  Pt performed shower transfer with min guard assist.  She is progressing toward OT goals.    Follow Up Recommendations  No OT follow up;Supervision/Assistance - 24 hour    Equipment Recommendations  None recommended by OT    Recommendations for Other Services      Precautions / Restrictions Precautions Precautions: Knee;Fall Required Braces or Orthoses: Knee Immobilizer - Left Knee Immobilizer - Left: Discontinue once straight leg raise with < 10 degree lag Restrictions Weight Bearing Restrictions: No LLE Weight Bearing: Weight bearing as tolerated       Mobility Bed Mobility Overal bed mobility: Needs Assistance Bed Mobility: Supine to Sit;Sit to Supine     Supine to sit: Min assist Sit to supine: Min assist   General bed mobility comments: assist for Lt LE   Transfers Overall transfer level: Needs assistance Equipment used: Rolling walker (2 wheeled) Transfers: Sit to/from UGI CorporationStand;Stand Pivot Transfers Sit to Stand: Min guard Stand pivot transfers: Min guard            Balance                                   ADL                           Toilet Transfer: Min guard;Ambulation;Comfort height toilet;RW;BSC   Toileting- Clothing Manipulation and Hygiene: Min guard;Sit to/from stand         General ADL Comments: Pt instructed in safe method for shower transfer onto 3in1 commode and was able to return demonstration with min guard assist       Vision                     Perception     Praxis      Cognition   Behavior During Therapy: [facility] Hospital[facility]FL for tasks assessed/performed Overall Cognitive Status: Within Functional Limits for tasks assessed                       Extremity/Trunk Assessment               Exercises      Shoulder Instructions       General Comments      Pertinent Vitals/ Pain       Pain Assessment: 0-10 Pain Score: 7  Pain Location: Lt knee Pain Descriptors / Indicators: Aching Pain Intervention(s): Monitored during session;Repositioned;Ice applied  Home Living                                          Prior Functioning/Environment              Frequency Min 2X/week     Progress Toward Goals  OT Goals(current goals can now be found in the care plan section)  Progress towards OT goals: Progressing toward goals     Plan Discharge plan remains appropriate    Co-evaluation                 End of Session Equipment Utilized During Treatment: Rolling walker;Left knee immobilizer  Activity Tolerance Patient tolerated treatment well   Patient Left in bed;with call bell/phone within reach;with family/visitor present   Nurse Communication Mobility status        Time: 1610-9604 OT Time Calculation (min): 18 min  Charges: OT General Charges $OT Visit: 1 Procedure OT Treatments $Self Care/Home Management : 8-22 mins  [name] [name], 5:50 PM

## 2014-05-23 NOTE — Op Note (Signed)
NAMEANALEA, MULLER NO.:  [id number]  MEDICAL RECORD NO.:  [id number]  LOCATION:  1620                         FACILITY:  [facility]  PHYSICIAN:  Georges Lynch. Shaylinn Hladik, M.D.DATE OF BIRTH:  November 02, 1955  DATE OF PROCEDURE:  05/22/2014 DATE OF DISCHARGE:                              OPERATIVE REPORT   SURGEON:  Georges Lynch. Darrelyn Hillock, M.D.  ASSISTANT:  Dimitri Ped, [place]  PREOPERATIVE DIAGNOSIS:  Primary osteoarthritis with bone-on-bone, left knee.  POSTOPERATIVE DIAGNOSIS:  Primary osteoarthritis with bone-on-bone, left knee.  OPERATION:  Left total knee arthroplasty utilizing the DePuy system. All three components were cemented.  Gentamicin was used in the cement. The sizes used was a size 3 left posterior cruciate sacrificing femoral component.  Tibial tray was a size 2.5.  The insert was a size 3, 10-mm thickness rotating platform.  The patella was a size 35 with 3 pegs.  DESCRIPTION OF PROCEDURE:  Under general anesthesia, routine orthopedic prepping and draping of the left lower extremity was carried out.  The time-out was first carried out in usual fashion and also marked the appropriate left leg in the holding area.  She had 2 g of IV Ancef.  She will be given tranexamic acid locally.  At this time, the leg was exsanguinated with an Esmarch.  Tourniquet was elevated at 300 mmHg. The left leg was placed in the [facility] knee holder.  The knee was flexed. Anterior approach to left knee was carried out.  Two flaps were created. I then carried out a median parapatellar incision.  The patella was then reflected laterally.  I did medial and lateral meniscectomies as well. I also excised the anterior and posterior cruciate ligaments.  I then made my initial drill hole on the distal to intercondylar notch.  The canal finder then was inserted.  I then thoroughly irrigated out the canal, then removed 11-mm thickness off the distal femur at this particular time.  All the  spurs were removed as well.  I then directed attention to the measurement of the femur and measured a size 3.  We then inserted our next jig and did anterior, posterior and chamfering cuts for a size 3 left femoral component.  Following that, I then directed attention to the tibial plateau.  We initially shaved off the spinous processes with a saw and then, we measured it to be 2.5 mm wide. We then made our initial drill hole in the tibial plateau.  The canal finder was inserted.  We thoroughly irrigated out the canal.  We then inserted our intramedullary guide rod and removed six 8-mm thickness off the affected medial side.  We first tried six that was not enough.  We then inserted our lamina spreaders to remove the posterior spurs, which were minimal.  I then inserted my spacer blocks, we had excellent stability with the size 10.  We then went on and proceeded to continue to prepare the tibia where we cut our keel cuts out of the tibia in usual fashion.  We then inserted our trial tibial plate.  Following that, we then went on, cut our notch cut out of the distal femur.  We inserted our trial  components and reduced the knee and then did our resurfacing procedure on the patella for a size 35 patella.  Three drill holes were made in the patella.  We then removed all trial components, thoroughly water picked out the knee, dried the knee out and then cemented all three components in simultaneously with gentamicin in the cement.  Once the cement was hardened, we thoroughly removed a small piece of cement.  We water picked out the knee to make sure all pieces of cement were removed.  I then injected 20 mL of 0.25% Marcaine plain in the soft tissue.  We reinspected the knee for cement and then inserted our permanent 10-mm thickness rotating platform, size 3.  We reduced the knee and had excellent function.  The Hemovac drain was inserted.  Then, the wound was closed in layers in usual fashion.   Sterile dressings were applied after the wound was closed.  Prior to closing the wound, we also injected the 20 mL of Exparel and 20 mL mixed with saline.          ______________________________ Georges Lynchonald A. Darrelyn HillockGioffre, M.D.     RAG/MEDQ  D:  05/22/2014  T:  05/23/2014  Job:  [id number]

## 2014-05-23 NOTE — Evaluation (Signed)
Physical Therapy Evaluation Patient Details Name: [name] MRN: [id number] DOB: 07/19/1955 Today's Date: 05/23/2014   History of Present Illness  L TKR; s/p R TKR 2011  Clinical Impression  Pt s/p L TKR presents with decreased L LE strength/ROM and post op pain limiting functional mobility.  Pt should progress to dc home with family assist and HHPT follow up.    Follow Up Recommendations Home health PT    Equipment Recommendations  None recommended by PT    Recommendations for Other Services OT consult     Precautions / Restrictions Precautions Precautions: Knee;Fall Required Braces or Orthoses: Knee Immobilizer - Left Knee Immobilizer - Left: Discontinue once straight leg raise with < 10 degree lag Restrictions Weight Bearing Restrictions: No LLE Weight Bearing: Weight bearing as tolerated      Mobility  Bed Mobility Overal bed mobility: Needs Assistance Bed Mobility: Supine to Sit     Supine to sit: Min assist     General bed mobility comments: cues for sequence and assist to manage L LE   Transfers Overall transfer level: Needs assistance Equipment used: Rolling walker (2 wheeled) Transfers: Sit to/from Stand Sit to Stand: Min assist         General transfer comment: cues for LE management and use of UEs to self assist  Ambulation/Gait Ambulation/Gait assistance: Min assist Ambulation Distance (Feet): 45 Feet (and 20' to bathroom) Assistive device: Rolling walker (2 wheeled) Gait Pattern/deviations: Step-to pattern;Decreased step length - right;Decreased step length - left;Shuffle;Trunk flexed     General Gait Details: cues for sequence, posture and position from [employer]W  Stairs            Wheelchair Mobility    Modified Rankin (Stroke Patients Only)       Balance                                             Pertinent Vitals/Pain Pain Assessment: 0-10 Pain Score: 7  Pain Location: L knee  Pain Descriptors /  Indicators: Aching;Sore Pain Intervention(s): Limited activity within patient's tolerance;Monitored during session;Ice applied;Patient requesting pain meds-RN notified    Home Living Family/patient expects to be discharged to:: Private residence Living Arrangements: Spouse/significant other Available Help at Discharge: Family Type of Home: House Home Access: Stairs to enter Entrance Stairs-Rails: Right;Left;Can reach both Secretary/administratorntrance Stairs-Number of Steps: 3 Home Layout: One level Home Equipment: Environmental consultantWalker - 2 wheels      Prior Function Level of Independence: Independent               Hand Dominance   Dominant Hand: Right    Extremity/Trunk Assessment   Upper Extremity Assessment: Overall WFL for tasks assessed           Lower Extremity Assessment: LLE deficits/detail      Cervical / Trunk Assessment: Normal  Communication   Communication: No difficulties  Cognition Arousal/Alertness: Awake/alert Behavior During Therapy: WFL for tasks assessed/performed Overall Cognitive Status: Within Functional Limits for tasks assessed                      General Comments      Exercises        Assessment/Plan    PT Assessment Patient needs continued PT services  PT Diagnosis Difficulty walking   PT Problem List Decreased strength;Decreased range of motion;Decreased activity tolerance;Decreased mobility;Decreased knowledge of use  of DME;Pain  PT Treatment Interventions DME instruction;Gait training;Stair training;Functional mobility training;Therapeutic activities;Therapeutic exercise;Patient/family education   PT Goals (Current goals can be found in the Care Plan section) Acute Rehab PT Goals Patient Stated Goal: Resume previous lifestyle with decreased pain PT Goal Formulation: With patient Time For Goal Achievement: 05/30/14 Potential to Achieve Goals: Good    Frequency 7X/week   Barriers to discharge        Co-evaluation               End  of Session Equipment Utilized During Treatment: Gait belt;Left knee immobilizer Activity Tolerance: Patient tolerated treatment well;Patient limited by pain Patient left: in chair;with call bell/phone within reach Nurse Communication: Mobility status         Time: 1135-1200 PT Time Calculation (min) (ACUTE ONLY): 25 min   Charges:   PT Evaluation $Initial PT Evaluation Tier I: 1 Procedure PT Treatments $Gait Training: 8-22 mins   PT G Codes:        [name] June 03, 2014, 12:54 PM

## 2014-05-23 NOTE — Evaluation (Signed)
Occupational Therapy Evaluation Patient Details Name: Leslie KluverRoxie [name] MRN: [id number] DOB: 05-28-1955 Today's Date: 05/23/2014    History of Present Illness L TKR; s/p R TKR 2011   Clinical Impression   Pt admitted with above. She demonstrates the below listed deficits and will benefit from continued OT to maximize safety and independence with BADLs.  Pt currently requires min guard - min A for ADLs. She has good family support at home.       Follow Up Recommendations  No OT follow up;Supervision/Assistance - 24 hour    Equipment Recommendations  None recommended by OT    Recommendations for Other Services       Precautions / Restrictions Precautions Precautions: Knee;Fall Required Braces or Orthoses: Knee Immobilizer - Left Knee Immobilizer - Left: Discontinue once straight leg raise with < 10 degree lag Restrictions Weight Bearing Restrictions: No LLE Weight Bearing: Weight bearing as tolerated      Mobility Bed Mobility Overal bed mobility: Needs Assistance Bed Mobility: Sit to Supine     Supine to sit: Min assist Sit to supine: Min assist   General bed mobility comments: assist for Lt. LE   Transfers Overall transfer level: Needs assistance Equipment used: Rolling walker (2 wheeled) Transfers: Sit to/from U[employer] CorporationStand;Stand Pivot Transfers Sit to Stand: Min guard Stand pivot transfers: Min guard       General transfer comment: cues for LE management and use of UEs to self assist    Balance                                            ADL Overall ADL's : Needs assistance/impaired Eating/Feeding: Independent   Grooming: Wash/dry hands;Wash/dry face;Oral care;Brushing hair;Min guard;Standing   Upper Body Bathing: Set up;Sitting   Lower Body Bathing: Min guard;Sit to/from stand   Upper Body Dressing : Set up;Sitting   Lower Body Dressing: Min guard;Sit to/from stand   Toilet Transfer: Min guard;Ambulation;Comfort height toilet;RW;BSC    Toileting- Clothing Manipulation and Hygiene: Min guard;Sit to/from stand       Functional mobility during ADLs: Engineer, technical salesMin guard;Rolling walker       Vision     Perception     Praxis      Pertinent Vitals/Pain Pain Assessment: 0-10 Pain Score: 7  Pain Location: Lt knee Pain Descriptors / Indicators: Aching Pain Intervention(s): Monitored during session;Repositioned;Ice applied     Hand Dominance Right   Extremity/Trunk Assessment Upper Extremity Assessment Upper Extremity Assessment: Overall WFL for tasks assessed   Lower Extremity Assessment Lower Extremity Assessment: Defer to PT evaluation   Cervical / Trunk Assessment Cervical / Trunk Assessment: Normal   Communication Communication Communication: No difficulties   Cognition Arousal/Alertness: Awake/alert Behavior During Therapy: WFL for tasks assessed/performed Overall Cognitive Status: Within Functional Limits for tasks assessed                     General Comments       Exercises Exercises: Total Joint     Shoulder Instructions      Home Living Family/patient expects to be discharged to:: Private residence Living Arrangements: Spouse/significant other Available Help at Discharge: Family Type of Home: House Home Access: Stairs to enter Secretary/administratorntrance Stairs-Number of Steps: 3 Entrance Stairs-Rails: Right;Left;Can reach both Home Layout: One level     Bathroom Shower/Tub: Producer, television/film/videoWalk-in shower   Bathroom Toilet: Standard     Home Equipment:  Walker - 2 wheels;Bedside commode          Prior Functioning/Environment Level of Independence: Independent             OT Diagnosis: Generalized weakness;Acute pain   OT Problem List: Decreased activity tolerance;Decreased safety awareness;Decreased knowledge of use of DME or AE;Pain   OT Treatment/Interventions: Self-care/ADL training;DME and/or AE instruction;Therapeutic activities;Patient/family education    OT Goals(Current goals can be found  in the care plan section) Acute Rehab OT Goals Patient Stated Goal: to regain good function of Lt. knee  OT Goal Formulation: With patient Time For Goal Achievement: 05/30/14 Potential to Achieve Goals: Good  OT Frequency: Min 2X/week   Barriers to D/C:            Co-evaluation              End of Session Equipment Utilized During Treatment: Rolling walker;Left knee immobilizer Nurse Communication: Mobility status  Activity Tolerance: Patient tolerated treatment well Patient left: in bed;with call bell/phone within reach   Time: 1233-1251 OT Time Calculation (min): 18 min Charges:  OT General Charges $OT Visit: 1 Procedure OT Evaluation $Initial OT Evaluation Tier I: 1 Procedure G-Codes:    Jeani Hawking M 06/01/2014, 12:59 PM

## 2014-05-24 LAB — BASIC METABOLIC PANEL
Anion gap: 11 (ref 5–15)
BUN: 13 mg/dL (ref 6–23)
CHLORIDE: 99 mmol/L (ref 96–112)
CO2: 25 mmol/L (ref 19–32)
Calcium: 9.2 mg/dL (ref 8.4–10.5)
Creatinine, Ser: 0.87 mg/dL (ref 0.50–1.10)
GFR calc non Af Amer: 73 mL/min — ABNORMAL LOW (ref >90)
GFR, EST AFRICAN AMERICAN: 84 mL/min — AB (ref >90)
Glucose, Bld: 201 mg/dL — ABNORMAL HIGH (ref 70–99)
Potassium: 3.8 mmol/L (ref 3.5–5.1)
SODIUM: 135 mmol/L (ref 135–145)

## 2014-05-24 LAB — CBC
HCT: 36.6 % (ref 36.0–46.0)
HEMOGLOBIN: 11.4 g/dL — AB (ref 12.0–15.0)
MCH: 28 pg (ref 26.0–34.0)
MCHC: 31.1 g/dL (ref 30.0–36.0)
MCV: 89.9 fL (ref 78.0–100.0)
Platelets: 270 10*3/uL (ref 150–400)
RBC: 4.07 MIL/uL (ref 3.87–5.11)
RDW: 14 % (ref 11.5–15.5)
WBC: 7.1 10*3/uL (ref 4.0–10.5)

## 2014-05-24 LAB — GLUCOSE, CAPILLARY: Glucose-Capillary: 226 mg/dL — ABNORMAL HIGH (ref 70–99)

## 2014-05-24 NOTE — Progress Notes (Signed)
Physical Therapy Treatment Patient Details Name: [name] MRN: [id number] DOB: January 15, 1956 Today's Date: 05/24/2014    History of Present Illness L TKR; s/p R TKR 2011    PT Comments    Pt progressing well; pain much better controlled today  Follow Up Recommendations  Home health PT     Equipment Recommendations  None recommended by PT    Recommendations for Other Services       Precautions / Restrictions Precautions Precautions: Knee;Fall Required Braces or Orthoses: Knee Immobilizer - Left Knee Immobilizer - Left: Discontinue once straight leg raise with < 10 degree lag Restrictions Weight Bearing Restrictions: No LLE Weight Bearing: Weight bearing as tolerated    Mobility  Bed Mobility Overal bed mobility: Needs Assistance Bed Mobility: Supine to Sit;Sit to Supine     Supine to sit: Min assist Sit to supine: Min assist   General bed mobility comments: assist for Lt LE   Transfers Overall transfer level: Needs assistance Equipment used: Rolling walker (2 wheeled) Transfers: Sit to/from Stand Sit to Stand: Min guard         General transfer comment: cues for LE management and use of UEs to self assist  Ambulation/Gait Ambulation/Gait assistance: Min guard;Supervision Ambulation Distance (Feet): 110 Feet (15') Assistive device: Rolling walker (2 wheeled) Gait Pattern/deviations: Step-to pattern;Antalgic;Trunk flexed;Decreased stance time - left     General Gait Details: cues for sequence, posture and position from RW   Stairs Stairs: Yes Stairs assistance: Min guard Stair Management: Two rails;Step to pattern;Forwards Number of Stairs: 3 General stair comments: cues for sequence  Wheelchair Mobility    Modified Rankin (Stroke Patients Only)       Balance                                    Cognition Arousal/Alertness: Awake/alert Behavior During Therapy: WFL for tasks assessed/performed Overall Cognitive Status:  Within Functional Limits for tasks assessed                      Exercises Total Joint Exercises Ankle Circles/Pumps: AROM;Both;15 reps;Supine Quad Sets: AROM;Both;10 reps;Supine Heel Slides: AAROM;10 reps;Supine;Left Hip ABduction/ADduction: AROM;AAROM;Left;10 reps Straight Leg Raises: AAROM;Left;10 reps;Supine Goniometric ROM: 10 to 50*    General Comments        Pertinent Vitals/Pain Pain Assessment: 0-10 Pain Score: 4  Pain Location: L knee Pain Descriptors / Indicators: Aching Pain Intervention(s): Limited activity within patient's tolerance;Monitored during session;Premedicated before session;Ice applied    Home Living                      Prior Function            PT Goals (current goals can now be found in the care plan section) Acute Rehab PT Goals Patient Stated Goal: to regain good function of Lt. knee  PT Goal Formulation: With patient Time For Goal Achievement: 05/30/14 Potential to Achieve Goals: Good Progress towards PT goals: Progressing toward goals    Frequency  7X/week    PT Plan Current plan remains appropriate    Co-evaluation             End of Session Equipment Utilized During Treatment: Gait belt;Left knee immobilizer Activity Tolerance: Patient tolerated treatment well;Patient limited by pain Patient left: in bed;with call bell/phone within reach;with family/visitor present     Time: 4098-1191 PT Time Calculation (min) (ACUTE ONLY): 37  min  Charges:  $Gait Training: 8-22 mins $Therapeutic Exercise: 8-22 mins                    G Codes:      [name] 05/24/2014, 11:01 AM

## 2014-05-24 NOTE — Plan of Care (Signed)
Problem: Phase III Progression Outcomes Goal: Anticoagulant follow-up in place Outcome: Not Applicable Date Met:  94/12/90 Xarelto VTE, no f/u needed.

## 2014-05-24 NOTE — Progress Notes (Signed)
[facility] to pt [facility] Pt states she had [facility] in the past. Contacted [facility] with new referral. Pt states she has RW and 3n1 at home.  Isidoro DonningAlesia Tery Hoeger RN CCM Case Mgmt phone [phone]

## 2014-05-24 NOTE — Progress Notes (Signed)
Subjective: 2 Days Post-Op Procedure(s) (LRB): LEFT TOTAL KNEE ARTHROPLASTY (Left) Patient reports pain as mild.   Patient seen in rounds with Dr. Lequita Halt.  Doing a little better today.  Still with some discomfort. Walking with therapy yesterday. Patient is well, but has had some minor complaints of pain in the knee, requiring pain medications Patient is ready to go home today.  Objective: Vital signs in last 24 hours: Temp:  [97.9 F (36.6 C)-99.4 F (37.4 C)] 98.4 F (36.9 C) (04/09 0432) Pulse Rate:  [84-114] 114 (04/09 0432) Resp:  [16-18] 18 (04/09 0432) BP: (125-147)/(71-88) 147/88 mmHg (04/09 0432) SpO2:  [97 %-99 %] 98 % (04/09 0432)  Intake/Output from previous day:  Intake/Output Summary (Last 24 hours) at 05/24/14 0648 Last data filed at 05/23/14 2310  Gross per 24 hour  Intake 708.33 ml  Output   1425 ml  Net -716.67 ml    Intake/Output this shift: Total I/O In: -  Out: 300 [Urine:300]  Labs:  Recent Labs  05/23/14 0455 05/24/14 0510  HGB 11.5* 11.4*    Recent Labs  05/23/14 0455 05/24/14 0510  WBC 7.3 7.1  RBC 4.12 4.07  HCT 37.0 36.6  PLT 291 270    Recent Labs  05/23/14 0455 05/24/14 0510  NA 139 135  K 4.3 3.8  CL 105 99  CO2 23 25  BUN 18 13  CREATININE 0.78 0.87  GLUCOSE 159* 201*  CALCIUM 9.1 9.2   No results for input(s): LABPT, INR in the last 72 hours.  EXAM: General - Patient is Alert and Appropriate Extremity - Neurovascular intact Sensation intact distally Dorsiflexion/Plantar flexion intact Incision - clean, dry, no drainage Motor Function - intact, moving foot and toes well on exam.   Assessment/Plan: 2 Days Post-Op Procedure(s) (LRB): LEFT TOTAL KNEE ARTHROPLASTY (Left) Procedure(s) (LRB): LEFT TOTAL KNEE ARTHROPLASTY (Left) Past Medical History  Diagnosis Date  . Other and unspecified hyperlipidemia 401.9    250.00  . Tobacco use disorder     quit 11/10  . Osteoarthritis     severe right knee  R  TKR  . Asthma with COPD   . Pancreatitis 2003  . Diabetes mellitus without complication   . Complication of anesthesia     slow to wake up with last surgery in 2011   . Hypertension   . PAD (peripheral artery disease)     Severe stenosis mid right SFA s/p atherectomy 03/25/09. Moderate disease diffusely throughout the  iliacs, bilateral SFA, below knee vessels.  ABIs post-procedure were 0.85 on right and 0.99 on left.   . Headache     hx of migraines   . CAD (coronary artery disease)     ETT-myoview (12/10) with apical anterior ischemia, EF 75%.  LHC (1/11) showed diffuse disease..   There was a large D3 with serial 70%, 80-90%, and 70% stenoses then severe diffuse distal vessel disease.   Small to moderate OM1 with severe diffuse disease, 50% mRCA, 95% mid small PLV, 60% PDA.  EF 60%.  Patient managed medically, no interventional options.    Active Problems:   History of total knee arthroplasty  Estimated body mass index is 32.25 kg/(m^2) as calculated from the following:   Height as of this encounter:  (1.6 m).   Weight as of this encounter: 82.555 kg (182 lb). Up with therapy  DC home Diet - Cardiac diet and Diabetic diet Follow up - in 2 weeks Activity - WBAT Disposition - Home Condition  Upon Discharge - Good D/C Meds - See DC Summary DVT Prophylaxis - Xarelto  Avel Peacerew Perkins, PA-C Orthopaedic Surgery 05/24/2014, 6:48 AM

## 2014-05-26 NOTE — Discharge Summary (Signed)
Physician Discharge Summary   Patient ID: [name] MRN: [id number] DOB/AGE: August 25, 1955 59 y.o.  Admit date: 05/22/2014 Discharge date: 05/24/2014  Primary Diagnosis: Primary osteoarthritis, left knee  Admission Diagnoses:  Past Medical History  Diagnosis Date  . Other and unspecified hyperlipidemia 401.9    250.00  . Tobacco use disorder     quit 11/10  . Osteoarthritis     severe right knee  R TKR  . Asthma with COPD   . Pancreatitis 2003  . Diabetes mellitus without complication   . Complication of anesthesia     slow to wake up with last surgery in 2011   . Hypertension   . PAD (peripheral artery disease)     Severe stenosis mid right SFA s/p atherectomy 03/25/09. Moderate disease diffusely throughout the  iliacs, bilateral SFA, below knee vessels.  ABIs post-procedure were 0.85 on right and 0.99 on left.   . Headache     hx of migraines   . CAD (coronary artery disease)     ETT-myoview (12/10) with apical anterior ischemia, EF 75%.  LHC (1/11) showed diffuse disease..   There was a large D3 with serial 70%, 80-90%, and 70% stenoses then severe diffuse distal vessel disease.   Small to moderate OM1 with severe diffuse disease, 50% mRCA, 95% mid small PLV, 60% PDA.  EF 60%.  Patient managed medically, no interventional options.    Discharge Diagnoses:   Active Problems:   History of total knee arthroplasty  Estimated body mass index is 32.25 kg/(m^2) as calculated from the following:   Height as of this encounter: 5' 3"  (1.6 m).   Weight as of this encounter: 82.555 kg (182 lb).  Procedure:  Procedure(s) (LRB): LEFT TOTAL KNEE ARTHROPLASTY (Left)   Consults: None  HPI: [name], 59 y.o. female, has a history of pain and functional disability in the left knee due to arthritis and has failed non-surgical conservative treatments for greater than 12 weeks to includeNSAID's and/or analgesics, corticosteriod injections, use of assistive devices and activity  modification. Onset of symptoms was gradual, starting 3 years ago with gradually worsening course since that time. The patient noted no past surgery on the left knee(s). Patient currently rates pain in the left knee(s) at 7 out of 10 with activity. Patient has night pain, worsening of pain with activity and weight bearing, pain that interferes with activities of daily living, pain with passive range of motion, crepitus and joint swelling. Patient has evidence of periarticular osteophytes and joint space narrowing by imaging studies. There is no active infection.  Laboratory Data: Admission on 05/22/2014, Discharged on 05/24/2014  Component Date Value Ref Range Status  . Glucose-Capillary 05/22/2014 129* 70 - 99 mg/dL Final  . Comment 1 05/22/2014 Document in Chart   Final  . Glucose-Capillary 05/22/2014 174* 70 - 99 mg/dL Final  . Comment 1 05/22/2014 Notify RN   Final  . Comment 2 05/22/2014 Document in Chart   Final  . WBC 05/23/2014 7.3  4.0 - 10.5 K/uL Final  . RBC 05/23/2014 4.12  3.87 - 5.11 MIL/uL Final  . Hemoglobin 05/23/2014 11.5* 12.0 - 15.0 g/dL Final  . HCT 05/23/2014 37.0  36.0 - 46.0 % Final  . MCV 05/23/2014 89.8  78.0 - 100.0 fL Final  . MCH 05/23/2014 27.9  26.0 - 34.0 pg Final  . MCHC 05/23/2014 31.1  30.0 - 36.0 g/dL Final  . RDW 05/23/2014 14.0  11.5 - 15.5 % Final  . Platelets 05/23/2014 291  150 - 400 K/uL Final  . Sodium 05/23/2014 139  135 - 145 mmol/L Final  . Potassium 05/23/2014 4.3  3.5 - 5.1 mmol/L Final  . Chloride 05/23/2014 105  96 - 112 mmol/L Final  . CO2 05/23/2014 23  19 - 32 mmol/L Final  . Glucose, Bld 05/23/2014 159* 70 - 99 mg/dL Final  . BUN 05/23/2014 18  6 - 23 mg/dL Final  . Creatinine, Ser 05/23/2014 0.78  0.50 - 1.10 mg/dL Final  . Calcium 05/23/2014 9.1  8.4 - 10.5 mg/dL Final  . GFR calc non Af Amer 05/23/2014 >90  >90 mL/min Final  . GFR calc Af Amer 05/23/2014 >90  >90 mL/min Final   Comment: (NOTE) The eGFR has been calculated  using the CKD EPI equation. This calculation has not been validated in all clinical situations. eGFR's persistently <90 mL/min signify possible Chronic Kidney Disease.   . Anion gap 05/23/2014 11  5 - 15 Final  . Glucose-Capillary 05/22/2014 200* 70 - 99 mg/dL Final  . Glucose-Capillary 05/22/2014 307* 70 - 99 mg/dL Final  . Glucose-Capillary 05/23/2014 113* 70 - 99 mg/dL Final  . Comment 1 05/23/2014 Notify RN   Final  . Glucose-Capillary 05/23/2014 201* 70 - 99 mg/dL Final  . Comment 1 05/23/2014 Notify RN   Final  . Glucose-Capillary 05/23/2014 192* 70 - 99 mg/dL Final  . WBC 05/24/2014 7.1  4.0 - 10.5 K/uL Final  . RBC 05/24/2014 4.07  3.87 - 5.11 MIL/uL Final  . Hemoglobin 05/24/2014 11.4* 12.0 - 15.0 g/dL Final  . HCT 05/24/2014 36.6  36.0 - 46.0 % Final  . MCV 05/24/2014 89.9  78.0 - 100.0 fL Final  . MCH 05/24/2014 28.0  26.0 - 34.0 pg Final  . MCHC 05/24/2014 31.1  30.0 - 36.0 g/dL Final  . RDW 05/24/2014 14.0  11.5 - 15.5 % Final  . Platelets 05/24/2014 270  150 - 400 K/uL Final  . Sodium 05/24/2014 135  135 - 145 mmol/L Final  . Potassium 05/24/2014 3.8  3.5 - 5.1 mmol/L Final  . Chloride 05/24/2014 99  96 - 112 mmol/L Final  . CO2 05/24/2014 25  19 - 32 mmol/L Final  . Glucose, Bld 05/24/2014 201* 70 - 99 mg/dL Final  . BUN 05/24/2014 13  6 - 23 mg/dL Final  . Creatinine, Ser 05/24/2014 0.87  0.50 - 1.10 mg/dL Final  . Calcium 05/24/2014 9.2  8.4 - 10.5 mg/dL Final  . GFR calc non Af Amer 05/24/2014 73* >90 mL/min Final  . GFR calc Af Amer 05/24/2014 84* >90 mL/min Final   Comment: (NOTE) The eGFR has been calculated using the CKD EPI equation. This calculation has not been validated in all clinical situations. eGFR's persistently <90 mL/min signify possible Chronic Kidney Disease.   . Anion gap 05/24/2014 11  5 - 15 Final  . Glucose-Capillary 05/23/2014 182* 70 - 99 mg/dL Final  . Glucose-Capillary 05/24/2014 226* 70 - 99 mg/dL Final  Hospital Outpatient Visit on  05/13/2014  Component Date Value Ref Range Status  . aPTT 05/13/2014 34  24 - 37 seconds Final  . WBC 05/13/2014 6.6  4.0 - 10.5 K/uL Final  . RBC 05/13/2014 4.42  3.87 - 5.11 MIL/uL Final  . Hemoglobin 05/13/2014 12.2  12.0 - 15.0 g/dL Final  . HCT 05/13/2014 39.8  36.0 - 46.0 % Final  . MCV 05/13/2014 90.0  78.0 - 100.0 fL Final  . MCH 05/13/2014 27.6  26.0 - 34.0 pg  Final  . MCHC 05/13/2014 30.7  30.0 - 36.0 g/dL Final  . RDW 05/13/2014 14.0  11.5 - 15.5 % Final  . Platelets 05/13/2014 255  150 - 400 K/uL Final  . Neutrophils Relative % 05/13/2014 64  43 - 77 % Final  . Neutro Abs 05/13/2014 4.2  1.7 - 7.7 K/uL Final  . Lymphocytes Relative 05/13/2014 25  12 - 46 % Final  . Lymphs Abs 05/13/2014 1.7  0.7 - 4.0 K/uL Final  . Monocytes Relative 05/13/2014 6  3 - 12 % Final  . Monocytes Absolute 05/13/2014 0.4  0.1 - 1.0 K/uL Final  . Eosinophils Relative 05/13/2014 4  0 - 5 % Final  . Eosinophils Absolute 05/13/2014 0.3  0.0 - 0.7 K/uL Final  . Basophils Relative 05/13/2014 1  0 - 1 % Final  . Basophils Absolute 05/13/2014 0.0  0.0 - 0.1 K/uL Final  . Sodium 05/13/2014 140  135 - 145 mmol/L Final  . Potassium 05/13/2014 5.1  3.5 - 5.1 mmol/L Final  . Chloride 05/13/2014 104  96 - 112 mmol/L Final  . CO2 05/13/2014 24  19 - 32 mmol/L Final  . Glucose, Bld 05/13/2014 155* 70 - 99 mg/dL Final  . BUN 05/13/2014 28* 6 - 23 mg/dL Final  . Creatinine, Ser 05/13/2014 1.41* 0.50 - 1.10 mg/dL Final  . Calcium 05/13/2014 9.9  8.4 - 10.5 mg/dL Final  . Total Protein 05/13/2014 7.1  6.0 - 8.3 g/dL Final  . Albumin 05/13/2014 4.1  3.5 - 5.2 g/dL Final  . AST 05/13/2014 28  0 - 37 U/L Final  . ALT 05/13/2014 23  0 - 35 U/L Final  . Alkaline Phosphatase 05/13/2014 62  39 - 117 U/L Final  . Total Bilirubin 05/13/2014 0.4  0.3 - 1.2 mg/dL Final  . GFR calc non Af Amer 05/13/2014 40* >90 mL/min Final  . GFR calc Af Amer 05/13/2014 47* >90 mL/min Final   Comment: (NOTE) The eGFR has been  calculated using the CKD EPI equation. This calculation has not been validated in all clinical situations. eGFR's persistently <90 mL/min signify possible Chronic Kidney Disease.   . Anion gap 05/13/2014 12  5 - 15 Final  . Prothrombin Time 05/13/2014 12.1  11.6 - 15.2 seconds Final  . INR 05/13/2014 0.88  0.00 - 1.49 Final  . ABO/RH(D) 05/13/2014 O POS   Final  . Antibody Screen 05/13/2014 NEG   Final  . Sample Expiration 05/13/2014 05/25/2014   Final  . Color, Urine 05/13/2014 YELLOW  YELLOW Final  . APPearance 05/13/2014 CLEAR  CLEAR Final  . Specific Gravity, Urine 05/13/2014 1.017  1.005 - 1.030 Final  . pH 05/13/2014 5.0  5.0 - 8.0 Final  . Glucose, UA 05/13/2014 NEGATIVE  NEGATIVE mg/dL Final  . Hgb urine dipstick 05/13/2014 NEGATIVE  NEGATIVE Final  . Bilirubin Urine 05/13/2014 NEGATIVE  NEGATIVE Final  . Ketones, ur 05/13/2014 NEGATIVE  NEGATIVE mg/dL Final  . Protein, ur 05/13/2014 NEGATIVE  NEGATIVE mg/dL Final  . Urobilinogen, UA 05/13/2014 0.2  0.0 - 1.0 mg/dL Final  . Nitrite 05/13/2014 NEGATIVE  NEGATIVE Final  . Leukocytes, UA 05/13/2014 NEGATIVE  NEGATIVE Final   MICROSCOPIC NOT DONE ON URINES WITH NEGATIVE PROTEIN, BLOOD, LEUKOCYTES, NITRITE, OR GLUCOSE <1000 mg/dL.  Marland Kitchen MRSA, PCR 05/13/2014 NEGATIVE  NEGATIVE Final  . Staphylococcus aureus 05/13/2014 POSITIVE* NEGATIVE Final   Comment:        The Xpert SA Assay (FDA approved for NASAL specimens in  patients over 37 years of age), is one component of a comprehensive surveillance program.  Test performance has been validated by [facility] for patients greater than or equal to 53 year old. It is not intended to diagnose infection nor to guide or monitor treatment.      X-Rays:Dg Chest 2 View  05/13/2014   CLINICAL DATA:  Preoperative exam prior to left total knee joint replacement; history of reactive airway disease since childhood currently using 2 inhalers, history of coronary artery disease and diabetes   EXAM: CHEST  2 VIEW  COMPARISON:  PA and lateral chest x-ray of February 23, 2009  FINDINGS: The lungs are mildly hyperinflated and clear. The heart and pulmonary vascularity are normal. The mediastinum is normal in width. There is no pleural effusion. There is mild degenerative disc space narrowing at multiple thoracic levels.  IMPRESSION: Mild hyperinflation consistent with known reactive airway disease. There is no CHF, pneumonia, nor other active cardiopulmonary disease.   Electronically Signed   By: David  Martinique   On: 05/13/2014 15:09    EKG: Orders placed or performed during the hospital encounter of 05/13/14  . EKG  . EKG     Hospital Course: [name] is a 59 y.o. who was admitted to [facility]. They were brought to the operating room on 05/22/2014 and underwent Procedure(s): LEFT TOTAL KNEE ARTHROPLASTY.  Patient tolerated the procedure well and was later transferred to the recovery room and then to the orthopaedic floor for postoperative care.  They were given PO and IV analgesics for pain control following their surgery.  They were given 24 hours of postoperative antibiotics of  Anti-infectives    Start     Dose/Rate Route Frequency Ordered Stop   05/22/14 1900  ceFAZolin (ANCEF) IVPB 1 g/50 mL premix     1 g 100 mL/hr over 30 Minutes Intravenous Every 6 hours 05/22/14 1636 05/23/14 0147   05/22/14 1645  fluconazole (DIFLUCAN) tablet 150 mg  Status:  Discontinued     150 mg Oral  Once 05/22/14 1636 05/22/14 1656   05/22/14 1450  polymyxin B 500,000 Units, bacitracin 50,000 Units in sodium chloride irrigation 0.9 % 500 mL irrigation  Status:  Discontinued       As needed 05/22/14 1450 05/22/14 1519   05/22/14 1043  ceFAZolin (ANCEF) IVPB 2 g/50 mL premix     2 g 100 mL/hr over 30 Minutes Intravenous On call to O.R. 05/22/14 1043 05/22/14 1300     and started on DVT prophylaxis in the form of Xarelto.   PT and OT were ordered for total joint protocol.  Discharge planning  consulted to help with postop disposition and equipment needs.  Patient had a fair night on the evening of surgery.  They started to get up OOB with therapy on day one. Hemovac drain was pulled without difficulty.  Continued to work with therapy into day two.  Dressing was changed on day two and the incision was clean and dry.  The patient had progressed with therapy and meeting their goals.  Incision was healing well.  Patient was seen in rounds and was ready to go home.   Diet: Cardiac diet and Diabetic diet Activity:WBAT Follow-up:in 2 weeks Disposition - Home Discharged Condition: stable   Discharge Instructions    Call MD / Call 911    Complete by:  As directed   If you experience chest pain or shortness of breath, CALL 911 and be transported to the hospital  emergency room.  If you develope a fever above 101 F, pus (white drainage) or increased drainage or redness at the wound, or calf pain, call your surgeon's office.     Constipation Prevention    Complete by:  As directed   Drink plenty of fluids.  Prune juice may be helpful.  You may use a stool softener, such as Colace (over the counter) 100 mg twice a day.  Use MiraLax (over the counter) for constipation as needed.     Diet - low sodium heart healthy    Complete by:  As directed      Diet Carb Modified    Complete by:  As directed      Discharge instructions    Complete by:  As directed   INSTRUCTIONS AFTER JOINT REPLACEMENT   Remove items at home which could result in a fall. This includes throw rugs or furniture in walking pathways ICE to the affected joint every three hours while awake for 30 minutes at a time, for at least the first 3-5 days, and then as needed for pain and swelling.  Continue to use ice for pain and swelling. You may notice swelling that will progress down to the foot and ankle.  This is normal after surgery.  Elevate your leg when you are not up walking on it.   Continue to use the breathing machine you got  in the hospital (incentive spirometer) which will help keep your temperature down.  It is common for your temperature to cycle up and down following surgery, especially at night when you are not up moving around and exerting yourself.  The breathing machine keeps your lungs expanded and your temperature down.   DIET:  As you were doing prior to hospitalization, we recommend a well-balanced diet.  DRESSING / WOUND CARE / SHOWERING  Keep the surgical dressing until follow up.  The dressing is water proof, so you can shower without any extra covering.  IF THE DRESSING FALLS OFF or the wound gets wet inside, change the dressing with sterile gauze.  Please use good hand washing techniques before changing the dressing.  Do not use any lotions or creams on the incision until instructed by your surgeon.    ACTIVITY  Increase activity slowly as tolerated, but follow the weight bearing instructions below.   No driving for 6 weeks or until further direction given by your physician.  You cannot drive while taking narcotics.  No lifting or carrying greater than 10 lbs. until further directed by your surgeon. Avoid periods of inactivity such as sitting longer than an hour when not asleep. This helps prevent blood clots.  You may return to work once you are authorized by your doctor.     WEIGHT BEARING   Weight bearing as tolerated with assist device (walker, cane, etc) as directed, use it as long as suggested by your surgeon or therapist, typically at least 4-6 weeks.   EXERCISES  Results after joint replacement surgery are often greatly improved when you follow the exercise, range of motion and muscle strengthening exercises prescribed by your doctor. Safety measures are also important to protect the joint from further injury. Any time any of these exercises cause you to have increased pain or swelling, decrease what you are doing until you are comfortable again and then slowly increase them. If you  have problems or questions, call your caregiver or physical therapist for advice.   Rehabilitation is important following a joint replacement. After just  a few days of immobilization, the muscles of the leg can become weakened and shrink (atrophy).  These exercises are designed to build up the tone and strength of the thigh and leg muscles and to improve motion. Often times heat used for twenty to thirty minutes before working out will loosen up your tissues and help with improving the range of motion but do not use heat for the first two weeks following surgery (sometimes heat can increase post-operative swelling).   These exercises can be done on a training (exercise) mat, on the floor, on a table or on a bed. Use whatever works the best and is most comfortable for you.    Use music or television while you are exercising so that the exercises are a pleasant break in your day. This will make your life better with the exercises acting as a break in your routine that you can look forward to.   Perform all exercises about fifteen times, three times per day or as directed.  You should exercise both the operative leg and the other leg as well.  Exercises include:   Quad Sets - Tighten up the muscle on the front of the thigh (Quad) and hold for 5-10 seconds.   Straight Leg Raises - With your knee straight (if you were given a brace, keep it on), lift the leg to 60 degrees, hold for 3 seconds, and slowly lower the leg.  Perform this exercise against resistance later as your leg gets stronger.  Leg Slides: Lying on your back, slowly slide your foot toward your buttocks, bending your knee up off the floor (only go as far as is comfortable). Then slowly slide your foot back down until your leg is flat on the floor again.  Angel Wings: Lying on your back spread your legs to the side as far apart as you can without causing discomfort.  Hamstring Strength:  Lying on your back, push your heel against the floor with  your leg straight by tightening up the muscles of your buttocks.  Repeat, but this time bend your knee to a comfortable angle, and push your heel against the floor.  You may put a pillow under the heel to make it more comfortable if necessary.   A rehabilitation program following joint replacement surgery can speed recovery and prevent re-injury in the future due to weakened muscles. Contact your doctor or a physical therapist for more information on knee rehabilitation.    CONSTIPATION  Constipation is defined medically as fewer than three stools per week and severe constipation as less than one stool per week.  Even if you have a regular bowel pattern at home, your normal regimen is likely to be disrupted due to multiple reasons following surgery.  Combination of anesthesia, postoperative narcotics, change in appetite and fluid intake all can affect your bowels.   YOU MUST use at least one of the following options; they are listed in order of increasing strength to get the job done.  They are all available over the counter, and you may need to use some, POSSIBLY even all of these options:    Drink plenty of fluids (prune juice may be helpful) and high fiber foods Colace 100 mg by mouth twice a day  Senokot for constipation as directed and as needed Dulcolax (bisacodyl), take with full glass of water  Miralax (polyethylene glycol) once or twice a day as needed.  If you have tried all these things and are unable to have a  bowel movement in the first 3-4 days after surgery call either your surgeon or your primary doctor.    If you experience loose stools or diarrhea, hold the medications until you stool forms back up.  If your symptoms do not get better within 1 week or if they get worse, check with your doctor.  If you experience "the worst abdominal pain ever" or develop nausea or vomiting, please contact the office immediately for further recommendations for treatment.   ITCHING:  If you  experience itching with your medications, try taking only a single pain pill, or even half a pain pill at a time.  You can also use Benadryl over the counter for itching or also to help with sleep.   TED HOSE STOCKINGS:  Use stockings on both legs until for at least 2 weeks or as directed by physician office. They may be removed at night for sleeping.  MEDICATIONS:  See your medication summary on the "After Visit Summary" that nursing will review with you.  You may have some home medications which will be placed on hold until you complete the course of blood thinner medication.  It is important for you to complete the blood thinner medication as prescribed.  PRECAUTIONS:  If you experience chest pain or shortness of breath - call 911 immediately for transfer to the hospital emergency department.   If you develop a fever greater that 101 F, purulent drainage from wound, increased redness or drainage from wound, foul odor from the wound/dressing, or calf pain - CONTACT YOUR SURGEON.                                                   FOLLOW-UP APPOINTMENTS:  If you do not already have a post-op appointment, please call the office for an appointment to be seen by your surgeon.  Guidelines for how soon to be seen are listed in your "After Visit Summary", but are typically between 1-4 weeks after surgery.    MAKE SURE YOU:  Understand these instructions.  Get help right away if you are not doing well or get worse.    Thank you for letting us be a part of your medical care team.  It is a privilege we respect greatly.  We hope these instructions will help you stay on track for a fast and full recovery!     Driving restrictions    Complete by:  As directed   No driving     Increase activity slowly as tolerated    Complete by:  As directed             Medication List    STOP taking these medications        acetaminophen 500 MG tablet  Commonly known as:  TYLENOL     Aspirin-Acetaminophen  500-325 MG Pack     fluconazole 150 MG tablet  Commonly known as:  DIFLUCAN     HYDROcodone-acetaminophen 5-325 MG per tablet  Commonly known as:  NORCO/VICODIN     sitaGLIPtin 50 MG tablet  Commonly known as:  JANUVIA      TAKE these medications        acidophilus Caps capsule  Take 1 capsule by mouth daily.     albuterol 108 (90 BASE) MCG/ACT inhaler  Commonly known as:  PROVENTIL HFA;VENTOLIN HFA  Inhale 2  puffs into the lungs 2 (two) times daily as needed for wheezing or shortness of breath.     aspirin 81 MG tablet  Take 81 mg by mouth daily.     bisoprolol-hydrochlorothiazide 2.5-6.25 MG per tablet  Commonly known as:  ZIAC  Take 1 tablet by mouth daily.     clopidogrel 75 MG tablet  Commonly known as:  PLAVIX  TAKE 1 TABLET BY MOUTH DAILY     diphenhydrAMINE 25 mg capsule  Commonly known as:  BENADRYL  Take 25 mg by mouth every 6 (six) hours as needed for allergies.     docusate sodium 100 MG capsule  Commonly known as:  COLACE  Take 1 capsule (100 mg total) by mouth 2 (two) times daily.     ferrous sulfate 325 (65 FE) MG tablet  Take 1 tablet (325 mg total) by mouth 3 (three) times daily after meals.     fish oil-omega-3 fatty acids 1000 MG capsule  Take 2 capsules (2 g total) by mouth 2 (two) times daily.     fluticasone 50 MCG/ACT nasal spray  Commonly known as:  FLONASE  Place into both nostrils daily.     Fluticasone-Salmeterol 250-50 MCG/DOSE Aepb  Commonly known as:  ADVAIR DISKUS  Inhale 1 puff into the lungs every 12 (twelve) hours.     glimepiride 4 MG tablet  Commonly known as:  AMARYL  Take 1 tablet (4 mg total) by mouth daily with lunch.     glucose blood test strip  Check bs qam and prn (Pt has one touch berio meter)     isosorbide mononitrate 60 MG 24 hr tablet  Commonly known as:  IMDUR  Take 1 tablet (60 mg total) by mouth daily.     lisinopril 10 MG tablet  Commonly known as:  PRINIVIL,ZESTRIL  Take 1 tablet (10 mg total) by  mouth daily.     metFORMIN 1000 MG tablet  Commonly known as:  GLUCOPHAGE  Take 1 tablet (1,000 mg total) by mouth 2 (two) times daily with a meal.     methocarbamol 500 MG tablet  Commonly known as:  ROBAXIN  Take 1 tablet (500 mg total) by mouth every 6 (six) hours as needed for muscle spasms.     multivitamin tablet  Take 1 tablet by mouth daily.     omega-3 acid ethyl esters 1 G capsule  Commonly known as:  LOVAZA  Take 2 capsules (2 g total) by mouth 2 (two) times daily.     ondansetron 4 MG tablet  Commonly known as:  ZOFRAN  Take 1 tablet (4 mg total) by mouth every 8 (eight) hours as needed for nausea or vomiting.     oxyCODONE-acetaminophen 5-325 MG per tablet  Commonly known as:  PERCOCET/ROXICET  Take 1-2 tablets by mouth every 4 (four) hours as needed for moderate pain (if Norco ineffective).     pantoprazole 40 MG tablet  Commonly known as:  PROTONIX  Take 1 tablet (40 mg total) by mouth daily.     pioglitazone 30 MG tablet  Commonly known as:  ACTOS  Take 1 tablet (30 mg total) by mouth daily.     rosuvastatin 40 MG tablet  Commonly known as:  CRESTOR  Take 1 tablet (40 mg total) by mouth at bedtime.           Follow-up Information    Follow up with GIOFFRE,RONALD A, MD In 2 weeks.   Specialty:  Orthopedic Surgery  Contact information:   [address] [place] [address] [phone]       Signed: Ardeen Jourdain, PA-C Orthopaedic Surgery 05/26/2014, 12:14 PM

## 2014-05-28 LAB — POCT I-STAT 4, (NA,K, GLUC, HGB,HCT)
GLUCOSE: 121 mg/dL — AB (ref 70–99)
HCT: 38 % (ref 36.0–46.0)
Hemoglobin: 12.9 g/dL (ref 12.0–15.0)
Potassium: 4.1 mmol/L (ref 3.5–5.1)
SODIUM: 142 mmol/L (ref 135–145)

## 2014-06-16 ENCOUNTER — Telehealth: Payer: Self-pay | Admitting: Family Medicine

## 2014-06-16 NOTE — Telephone Encounter (Signed)
ok 

## 2014-06-16 NOTE — Telephone Encounter (Signed)
[phone]  PT is needing a refill on oxyCODONE-acetaminophen (PERCOCET/ROXICET) 5-325 MG per tablet

## 2014-06-16 NOTE — Telephone Encounter (Signed)
?   OK to Refill  

## 2014-06-18 NOTE — Telephone Encounter (Signed)
Pt called back about her RX for percocet and I informed her that it had been ok'd but she is not due for it until 06/22/14. She stated that she is out and the percocet does not last all month and would like to go back on the norco?

## 2014-06-19 MED ORDER — HYDROCODONE-ACETAMINOPHEN 5-325 MG PO TABS: 1.0000 | ORAL_TABLET | Freq: Three times a day (TID) | ORAL | Status: DC

## 2014-06-19 NOTE — Telephone Encounter (Signed)
No change in pain medications.  If taken as prescribed, will last the month.  Proceed with refill 5/8

## 2014-06-19 NOTE — Telephone Encounter (Signed)
Patient just had surgery and that is whys she was taking percocet but now she would like to go back to vicodin instead. Per [facility] ok to change back. RX printed, left up front and patient aware to pick up

## 2014-06-20 ENCOUNTER — Telehealth: Payer: Self-pay | Admitting: Family Medicine

## 2014-06-20 ENCOUNTER — Other Ambulatory Visit: Payer: Self-pay | Admitting: Family Medicine

## 2014-06-20 MED ORDER — HYDROCODONE-ACETAMINOPHEN 5-325 MG PO TABS: 1.0000 | ORAL_TABLET | Freq: Four times a day (QID) | ORAL | Status: DC

## 2014-06-20 NOTE — Telephone Encounter (Signed)
Pt called and states that since the rx for her hydrocodone was written for tid instead of qid pharm will not fill for 120 pills - told pt to bring back rx and will write for qid dosing so insurance will cover the 120 pills. rx printed and left up front

## 2014-06-20 NOTE — Telephone Encounter (Signed)
Wrong rx was returned.

## 2014-07-02 ENCOUNTER — Ambulatory Visit (HOSPITAL_COMMUNITY): Payer: 59 | Attending: Cardiovascular Disease

## 2014-07-02 ENCOUNTER — Other Ambulatory Visit: Payer: Self-pay | Admitting: Orthopedic Surgery

## 2014-07-02 DIAGNOSIS — M25579 Pain in unspecified ankle and joints of unspecified foot: Secondary | ICD-10-CM

## 2014-07-02 DIAGNOSIS — M25473 Effusion, unspecified ankle: Secondary | ICD-10-CM

## 2014-07-15 ENCOUNTER — Telehealth: Payer: Self-pay | Admitting: *Deleted

## 2014-07-15 NOTE — Telephone Encounter (Signed)
Submitted [employer] compass referral on 07/11/14 for referral to Dr. Marca Anconaalton McLean MD Cardiology with Referral number [id number]   Type of referral: Consult and treat  Number of visits:6  Start Date: 07/11/14  End Date: 01/11/15  Dx: I25.10       E78.5       I73.9  [facility] and verbally informed of referral number

## 2014-07-16 ENCOUNTER — Encounter: Payer: Self-pay | Admitting: Physician Assistant

## 2014-07-16 ENCOUNTER — Telehealth: Payer: Self-pay | Admitting: Family Medicine

## 2014-07-16 DIAGNOSIS — E1159 Type 2 diabetes mellitus with other circulatory complications: Secondary | ICD-10-CM | POA: Insufficient documentation

## 2014-07-16 DIAGNOSIS — I701 Atherosclerosis of renal artery: Secondary | ICD-10-CM | POA: Insufficient documentation

## 2014-07-16 DIAGNOSIS — E785 Hyperlipidemia, unspecified: Secondary | ICD-10-CM | POA: Insufficient documentation

## 2014-07-16 DIAGNOSIS — I2510 Atherosclerotic heart disease of native coronary artery without angina pectoris: Secondary | ICD-10-CM | POA: Insufficient documentation

## 2014-07-16 NOTE — Progress Notes (Signed)
Cardiology Office Note Date:  07/17/2014  Patient ID:  [name], DOB Jul 06, 1955, MRN [id number] PCP:  Leo GrosserPICKARD,Leslie TOM, MD  Cardiologist:  Thyra BreedMcLean PVKirke Corin: Arida   Chief Complaint: follow-up of CAD  History of Present Illness: Leslie KluverRoxie Monette is a 59 y.o. female with history of CAD (diffuse branch vessel disease in 2011 not amenable to PCI), dyslipidemia, PAD (s/p atherectomy of right SFA in 2011), HTN, DM, asthma who presents for follow-up.   With regard to prior hstory of PAD, she has h/o atherectomy of the right SFA in 2011. in 11/2011, LE duplex showed > 50% stenosis in the right proximal SFA.ABIs showed fall from 0.95 to 0.65 on the right with exercise. She saw Dr. Kirke CorinArida and had peripheral angiography in 12/2011 which showed only about 50% diffuse right SFA stenosis.Her leg pain was out of proportion to the degree of PAD. She was on cilostazol without much effect, so it was stopped. Follow-up ABIs in 09/2013 showed 0.87 on the right and 1.1 on the left. It was recommended that she continue aspirin and Plavix and follow-up in 1 year. Pain was felt more likely due to knee arthritis and peripheral neuropathy. In prep for knee surgery she underwent nuclear stress test in 03/2014 which was low risk without ischemia, EF 67% (possible distal anteroseptal infarct pattern but normal wall motion would on gated images would refute old infarct). She underwent uncomplicated left TKA in 05/2014. Last labs - 03/2014 showed LDL 45, trig 252, A1C 8.1, TSH normal. 05/2014 Hgb 11.4, Cr 0.87.  Overall she feels she is doing well. She is eating right and steadily getting back into activity after recent knee surgery. She did notice some leg cramping post-surgery a few weeks ago and had a LE venous duplex that was negative for DVT. This has improved. She does report rare chest pain while doing housework lasting about 5 minutes, but this does not happen every time she exerts herself. She has been able to work with PT for  her knee without any recent CP. She did notice increased dyspnea after her knee surgery. She said she was told it was something to do with your lungs settling after surgery. She has used her inhaler with intermittent relief. Her SOB has gotten better over the last few weeks. In the office she is not tachycardic, tachypneic or hypoxic. Pulse ox 96-98% on RA with ambulation in the office today. Not tachycardic with exertion. She reports occasional nosebleeds.   Past Medical History  Diagnosis Date  . Dyslipidemia   . Tobacco use disorder     quit 11/10  . Osteoarthritis     severe right knee  R TKR  . Asthma with COPD   . Pancreatitis 2003  . Diabetes mellitus with circulatory complication   . Complication of anesthesia     slow to wake up with last surgery in 2011   . Hypertension   . PAD (peripheral artery disease)     a. Severe stenosis mid right SFA s/p atherectomy 03/25/09. b. peripheral angiography in 12/2011 which showed only about 50% diffuse right SFA stenosis.  Marland Kitchen. Headache     hx of migraines   . CAD (coronary artery disease)     a. LHC 2011: Diffuse distal and branch vessel disease - patient managed medically, no interventional options. b. Nuc 03/2014 - low risk, no ischemia.  . Renal artery stenosis     a. LHC 2011 - 40-50% left RAS.    Past Surgical History  Procedure  Laterality Date  . Joint replacement  2011    right knee replacement   . Tubal ligation  1980  . Abdominal aortagram N/A 12/21/2011    Procedure: ABDOMINAL Ronny Flurry;  Surgeon: Iran Ouch, MD;  Location: [facility] CATH LAB;  Service: Cardiovascular;  Laterality: N/A;  . Cardiac catheterization    . Total knee arthroplasty Left 05/22/2014    Procedure: LEFT TOTAL KNEE ARTHROPLASTY;  Surgeon: Ranee Gosselin, MD;  Location: [facility] ORS;  Service: Orthopedics;  Laterality: Left;    Current Outpatient Prescriptions  Medication Sig Dispense Refill  . acidophilus (RISAQUAD) CAPS capsule Take 1 capsule by mouth daily.      Marland Kitchen albuterol (PROVENTIL HFA;VENTOLIN HFA) 108 (90 BASE) MCG/ACT inhaler Inhale 2 puffs into the lungs 2 (two) times daily as needed for wheezing or shortness of breath. 3 Inhaler 3  . aspirin 81 MG tablet Take 81 mg by mouth daily.      . bisoprolol-hydrochlorothiazide (ZIAC) 2.5-6.25 MG per tablet Take 1 tablet by mouth daily. 90 tablet 1  . clopidogrel (PLAVIX) 75 MG tablet TAKE 1 TABLET BY MOUTH DAILY 30 tablet 6  . diphenhydrAMINE (BENADRYL) 25 mg capsule Take 25 mg by mouth every 6 (six) hours as needed for allergies.    . fish oil-omega-3 fatty acids 1000 MG capsule Take 2 capsules (2 g total) by mouth 2 (two) times daily. 360 capsule 3  . fluticasone (FLONASE) 50 MCG/ACT nasal spray Place into both nostrils daily.    . Fluticasone-Salmeterol (ADVAIR DISKUS) 250-50 MCG/DOSE AEPB Inhale 1 puff into the lungs every 12 (twelve) hours. 180 each 3  . glimepiride (AMARYL) 4 MG tablet Take 1 tablet (4 mg total) by mouth daily with lunch. 90 tablet 4  . glucose blood test strip Check bs qam and prn (Pt has one touch berio meter) 100 each 5  . glucose blood test strip     . HYDROcodone-acetaminophen (NORCO/VICODIN) 5-325 MG per tablet Take 1 tablet by mouth 4 (four) times daily. 120 tablet 0  . isosorbide mononitrate (IMDUR) 60 MG 24 hr tablet Take 1 tablet (60 mg total) by mouth daily. 90 tablet 3  . lisinopril (PRINIVIL,ZESTRIL) 10 MG tablet Take 1 tablet (10 mg total) by mouth daily. 90 tablet 4  . metFORMIN (GLUCOPHAGE) 1000 MG tablet Take 1 tablet (1,000 mg total) by mouth 2 (two) times daily with a meal. 60 tablet 11  . methocarbamol (ROBAXIN) 500 MG tablet Take 1 tablet (500 mg total) by mouth every 6 (six) hours as needed for muscle spasms. 40 tablet 1  . Multiple Vitamin (MULTIVITAMIN) tablet Take 1 tablet by mouth daily.      Marland Kitchen omega-3 acid ethyl esters (LOVAZA) 1 G capsule Take 2 capsules (2 g total) by mouth 2 (two) times daily. 120 capsule 11  . ondansetron (ZOFRAN) 4 MG tablet Take 1  tablet (4 mg total) by mouth every 8 (eight) hours as needed for nausea or vomiting. 30 tablet 3  . pantoprazole (PROTONIX) 40 MG tablet Take 1 tablet (40 mg total) by mouth daily. 30 tablet 11  . pioglitazone (ACTOS) 30 MG tablet Take 1 tablet (30 mg total) by mouth daily. 30 tablet 3  . rosuvastatin (CRESTOR) 40 MG tablet Take 1 tablet (40 mg total) by mouth at bedtime. 30 tablet 3  . zolpidem (AMBIEN) 10 MG tablet Take 10 mg by mouth 3 times/day as needed-between meals & bedtime.  0   No current facility-administered medications for this visit.  Allergies:   Glucotrol; Influenza vaccines; Latex; Milk-related compounds; Tetanus toxoid; Gabapentin; Invokana; and Nortriptyline   Social History:  The patient  reports that she quit smoking about 5 years ago. She has never used smokeless tobacco. She reports that she does not drink alcohol or use illicit drugs.   Family History:  The patient's family history includes CVA in her mother; Emphysema in her father.  ROS:  Please see the history of present illness.  All other systems are reviewed and otherwise negative.   PHYSICAL EXAM:  VS:  BP 110/78 mmHg  Pulse 83  Ht  (1.6 m)  Wt 178 lb (80.74 kg)  BMI 31.54 kg/m2 BMI: Body mass index is 31.54 kg/(m^2). Initial BP recorded was 90/60 but this was with too large of a cuff. Recheck by me was 110/78. Well nourished, well developed WF, in no acute distress HEENT: normocephalic, atraumatic Neck: no JVD, carotid bruits or masses Cardiac:  normal S1, S2; RRR; no murmurs, rubs, or gallops Lungs:  Clear to auscultation bilaterally without wheezing, rhonchi or rales Abd: soft, nontender, no hepatomegaly, + BS MS: no deformity or atrophy Ext: no edema Skin: warm and dry, no rash. Well healing left surgical knee scar. Old well healed right surgical knee scar. Neuro:  moves all extremities spontaneously, no focal abnormalities noted, follows commands Psych: euthymic mood, full affect  EKG:   Done today shows NSR 83bpm, low voltage QRS, no acute ST-T changes. No change from prior.  Recent Labs: 03/27/2014: TSH 1.564 05/13/2014: ALT 23 05/24/2014: BUN 13; Creatinine 0.87; Hemoglobin 11.4*; Platelets 270; Potassium 3.8; Sodium 135  01/02/2014: Direct LDL 50.6 03/27/2014: Cholesterol, Total 141; HDL-C 46; LDL (calc) 45; Total CHOL/HDL Ratio 3.1; Triglycerides 252*; VLDL 50*   CrCl cannot be calculated (Patient has no serum creatinine result on file.).   Wt Readings from Last 3 Encounters:  07/17/14 178 lb (80.74 kg)  05/22/14 182 lb (82.555 kg)  03/27/14 180 lb (81.647 kg)     Other studies reviewed: Additional studies/records reviewed today include: summarized above  ASSESSMENT AND PLAN:  1. Diffuse CAD in 2011 - generally stable. Low risk nuc 03/2014. Continue ASA, BB, statin, Imdur. She reports rare transient chest discomfort with housework but has been able to progress PT more recently without any exertional discomfort. Will continue current regimen. Regarding SOB, we discussed further evaluation for PE during her visit. She is not tachypneic, tachycardic or hypoxic and does not have any signs of DVT on exam. Recent LE duplex was also negative for DVT. She elected to hold off on further evaluation at this time since symptoms have been improving. No signs of CHF. She is also doing a higher level of activity now (compared to before her knee surgery), so there may be a component of deconditioning. I asked her to monitor symptoms closely and notify our office if dyspnea worsens or does not continue to improve. Warning signs reviewed. Check CBC given reports of nosebleeds. I asked her to please call PCP to discuss as she may need to see ENT for cauterization. 2. PAD - follow up with Dr. Kirke Corin in 09/2014. Doing well on ASA/Plavix.  3. Dyslipidemia - followed by primary care. LDL was 45 in 03/2014. 4. Essential hypertension - well controlled. 5. Diabetes mellitus with circulatory complication  - f/u PCP.  Disposition: F/u with Dr. Kirke Corin as per recall 09/2014, and Dr. Shirlee Latch in 4 months to make sure CP/SOB are no longer an issue. Sooner if needed.  Current medicines  are reviewed at length with the patient today.  The patient did not have any concerns regarding medicines.  Signed, Ronie Spies PA-C 07/17/2014 12:00 PM     [facility] [address] [place] [address] [phone] (office)  [phone] (fax)

## 2014-07-16 NOTE — Telephone Encounter (Signed)
Patient calling for refill of hydrocodone [phone]

## 2014-07-16 NOTE — Telephone Encounter (Signed)
?   OK to Refill  

## 2014-07-17 ENCOUNTER — Encounter: Payer: Self-pay | Admitting: Physician Assistant

## 2014-07-17 ENCOUNTER — Ambulatory Visit (INDEPENDENT_AMBULATORY_CARE_PROVIDER_SITE_OTHER): Payer: 59 | Admitting: Physician Assistant

## 2014-07-17 VITALS — BP 110/78 | HR 83 | Ht 63.0 in | Wt 178.0 lb

## 2014-07-17 DIAGNOSIS — R0602 Shortness of breath: Secondary | ICD-10-CM

## 2014-07-17 DIAGNOSIS — I2510 Atherosclerotic heart disease of native coronary artery without angina pectoris: Secondary | ICD-10-CM | POA: Diagnosis not present

## 2014-07-17 DIAGNOSIS — I739 Peripheral vascular disease, unspecified: Secondary | ICD-10-CM | POA: Diagnosis not present

## 2014-07-17 DIAGNOSIS — E785 Hyperlipidemia, unspecified: Secondary | ICD-10-CM

## 2014-07-17 DIAGNOSIS — I10 Essential (primary) hypertension: Secondary | ICD-10-CM

## 2014-07-17 DIAGNOSIS — E1151 Type 2 diabetes mellitus with diabetic peripheral angiopathy without gangrene: Secondary | ICD-10-CM

## 2014-07-17 DIAGNOSIS — R040 Epistaxis: Secondary | ICD-10-CM

## 2014-07-17 LAB — CBC WITH DIFFERENTIAL/PLATELET
Basophils Absolute: 0.1 10*3/uL (ref 0.0–0.1)
Basophils Relative: 0.7 % (ref 0.0–3.0)
Eosinophils Absolute: 0.4 10*3/uL (ref 0.0–0.7)
Eosinophils Relative: 3.9 % (ref 0.0–5.0)
HEMATOCRIT: 37.3 % (ref 36.0–46.0)
Hemoglobin: 12.1 g/dL (ref 12.0–15.0)
Lymphocytes Relative: 27.9 % (ref 12.0–46.0)
Lymphs Abs: 2.6 10*3/uL (ref 0.7–4.0)
MCHC: 32.4 g/dL (ref 30.0–36.0)
MCV: 86.9 fl (ref 78.0–100.0)
Monocytes Absolute: 0.7 10*3/uL (ref 0.1–1.0)
Monocytes Relative: 7.8 % (ref 3.0–12.0)
NEUTROS PCT: 59.7 % (ref 43.0–77.0)
Neutro Abs: 5.5 10*3/uL (ref 1.4–7.7)
Platelets: 321 10*3/uL (ref 150.0–400.0)
RBC: 4.29 Mil/uL (ref 3.87–5.11)
RDW: 15.4 % (ref 11.5–15.5)
WBC: 9.3 10*3/uL (ref 4.0–10.5)

## 2014-07-17 MED ORDER — HYDROCODONE-ACETAMINOPHEN 5-325 MG PO TABS: 1.0000 | ORAL_TABLET | Freq: Four times a day (QID) | ORAL | Status: DC

## 2014-07-17 NOTE — Telephone Encounter (Signed)
ok 

## 2014-07-17 NOTE — Telephone Encounter (Signed)
RX printed, left up front and patient aware to pick up via vm 

## 2014-07-17 NOTE — Patient Instructions (Signed)
Medication Instructions:  Your physician recommends that you continue on your current medications as directed. Please refer to the Current Medication list given to you today.  Labwork: Your physician recommends that you have labs today. CBC  Testing/Procedures: NONE   Follow-Up: Your physician recommends that you schedule a follow-up appointment in: 2 months with Dr. Kirke CorinArida  Your physician recommends that you schedule a follow-up appointment in: 4 months with Dr. Shirlee LatchMcLean   Any Other Special Instructions Will Be Listed Below (If Applicable).  Talk to your primary care doctor about your nose bleeds.

## 2014-07-18 ENCOUNTER — Other Ambulatory Visit: Payer: Self-pay | Admitting: *Deleted

## 2014-07-18 DIAGNOSIS — K861 Other chronic pancreatitis: Secondary | ICD-10-CM

## 2014-07-18 MED ORDER — PIOGLITAZONE HCL 30 MG PO TABS: 30.0000 mg | ORAL_TABLET | Freq: Every day | ORAL | Status: DC

## 2014-07-18 MED ORDER — ONDANSETRON HCL 4 MG PO TABS: 4.0000 mg | ORAL_TABLET | Freq: Three times a day (TID) | ORAL | Status: DC | PRN

## 2014-07-18 NOTE — Telephone Encounter (Signed)
Received fax requesting refill on Actos and Zofran.   Refill appropriate and filled per protocol.

## 2014-07-24 ENCOUNTER — Other Ambulatory Visit: Payer: Self-pay

## 2014-07-24 MED ORDER — BISOPROLOL-HYDROCHLOROTHIAZIDE 2.5-6.25 MG PO TABS: 1.0000 | ORAL_TABLET | Freq: Every day | ORAL | Status: DC

## 2014-07-25 ENCOUNTER — Other Ambulatory Visit: Payer: Self-pay | Admitting: Family Medicine

## 2014-07-25 MED ORDER — ROSUVASTATIN CALCIUM 40 MG PO TABS: 40.0000 mg | ORAL_TABLET | Freq: Every day | ORAL | Status: DC

## 2014-07-30 ENCOUNTER — Other Ambulatory Visit: Payer: Self-pay | Admitting: Family Medicine

## 2014-07-30 DIAGNOSIS — I10 Essential (primary) hypertension: Secondary | ICD-10-CM

## 2014-07-30 MED ORDER — LISINOPRIL 10 MG PO TABS: 10.0000 mg | ORAL_TABLET | Freq: Every day | ORAL | Status: DC

## 2014-07-30 NOTE — Telephone Encounter (Signed)
Medication called/sent to requested pharmacy  

## 2014-08-14 ENCOUNTER — Telehealth: Payer: Self-pay | Admitting: Family Medicine

## 2014-08-14 MED ORDER — HYDROCODONE-ACETAMINOPHEN 5-325 MG PO TABS: 1.0000 | ORAL_TABLET | Freq: Four times a day (QID) | ORAL | Status: DC

## 2014-08-14 NOTE — Telephone Encounter (Signed)
RX printed, left up front and patient aware to pick up  

## 2014-08-14 NOTE — Telephone Encounter (Signed)
Approved. #[id number]. 

## 2014-08-14 NOTE — Telephone Encounter (Signed)
[phone] PT is calling in for refill on      HYDROcodone-acetaminophen (NORCO/VICODIN) 5-325 MG per tablet

## 2014-08-14 NOTE — Telephone Encounter (Signed)
?   OK to Refill  - last refill 07/17/14

## 2014-08-26 ENCOUNTER — Other Ambulatory Visit: Payer: Self-pay

## 2014-08-26 MED ORDER — CLOPIDOGREL BISULFATE 75 MG PO TABS: 75.0000 mg | ORAL_TABLET | Freq: Every day | ORAL | Status: DC

## 2014-09-15 ENCOUNTER — Telehealth: Payer: Self-pay | Admitting: Family Medicine

## 2014-09-15 MED ORDER — HYDROCODONE-ACETAMINOPHEN 5-325 MG PO TABS: 1.0000 | ORAL_TABLET | Freq: Four times a day (QID) | ORAL | Status: DC

## 2014-09-15 NOTE — Telephone Encounter (Signed)
?   OK to Refill  

## 2014-09-15 NOTE — Telephone Encounter (Signed)
Patient called requesting prescription for HYDROcodone-acetaminophen (NORCO/VICODIN) 5-325 MG per tablet

## 2014-09-15 NOTE — Telephone Encounter (Signed)
RX printed, left up front and patient aware to pick up  

## 2014-09-15 NOTE — Telephone Encounter (Signed)
ok 

## 2014-09-23 ENCOUNTER — Ambulatory Visit: Payer: 59 | Admitting: Cardiovascular Disease

## 2014-10-14 ENCOUNTER — Telehealth: Payer: Self-pay | Admitting: *Deleted

## 2014-10-14 ENCOUNTER — Encounter: Payer: Self-pay | Admitting: *Deleted

## 2014-10-14 MED ORDER — HYDROCODONE-ACETAMINOPHEN 5-325 MG PO TABS: 1.0000 | ORAL_TABLET | Freq: Four times a day (QID) | ORAL | Status: DC

## 2014-10-14 NOTE — Telephone Encounter (Signed)
Please disregard this message I took care of the mistake and shredded the rx dated 11/14/14

## 2014-10-14 NOTE — Telephone Encounter (Signed)
Pt calling needing refill on Hydrocodone 5-325mg ,  Last refill 09/15/14

## 2014-10-14 NOTE — Telephone Encounter (Signed)
ok 

## 2014-10-14 NOTE — Telephone Encounter (Signed)
This encounter was created in error - please disregard.

## 2014-10-14 NOTE — Telephone Encounter (Signed)
Two prescriptions printed out 11/14/14 was printed as do not refill until 11/14/14, per provider to do 2 month supply

## 2014-10-24 ENCOUNTER — Telehealth: Payer: Self-pay | Admitting: *Deleted

## 2014-10-24 NOTE — Telephone Encounter (Signed)
Submitted referral thru [facility] compass to Dr. Ranee Gosselin MD Ortopedic with referral number [id number]  Type of referral: Consult and treat  Number of visits: 6  Start date: 10/24/14  End date: 04/23/15  Dx: Z96.659-Presence of unspecified artificial knee joint

## 2014-10-27 ENCOUNTER — Other Ambulatory Visit: Payer: Self-pay | Admitting: Family Medicine

## 2014-10-27 MED ORDER — PIOGLITAZONE HCL 30 MG PO TABS: 30.0000 mg | ORAL_TABLET | Freq: Every day | ORAL | Status: DC

## 2014-10-30 ENCOUNTER — Telehealth: Payer: Self-pay | Admitting: Family Medicine

## 2014-10-30 MED ORDER — ALBUTEROL SULFATE HFA 108 (90 BASE) MCG/ACT IN AERS: 2.0000 | INHALATION_SPRAY | Freq: Two times a day (BID) | RESPIRATORY_TRACT | Status: DC | PRN

## 2014-10-30 MED ORDER — FLUTICASONE-SALMETEROL 250-50 MCG/DOSE IN AEPB: 1.0000 | INHALATION_SPRAY | Freq: Two times a day (BID) | RESPIRATORY_TRACT | Status: DC

## 2014-10-30 NOTE — Telephone Encounter (Signed)
Medication called/sent to requested pharmacy  

## 2014-10-30 NOTE — Telephone Encounter (Signed)
Pt is calling for a refill of Advair 250/50 and Bentolin 90.  She uses the [facility] on [address]. [phone]

## 2014-11-11 ENCOUNTER — Telehealth: Payer: Self-pay | Admitting: Family Medicine

## 2014-11-11 NOTE — Telephone Encounter (Signed)
Patient needs rx for her hydrocodone and also rx for her test strips to new pharmacy  [facility] [address]  [phone]  Also wants to know if you dr pickard could fill her methocarbamol  which dr Netta Corrigan usually prescribes for her but she is not going back to him for some time

## 2014-11-11 NOTE — Telephone Encounter (Signed)
?   OK to Refill both the pain med and muscle relaxer

## 2014-11-11 NOTE — Telephone Encounter (Signed)
ok 

## 2014-11-12 MED ORDER — GLUCOSE BLOOD VI STRP: ORAL_STRIP | Status: DC

## 2014-11-12 MED ORDER — METHOCARBAMOL 500 MG PO TABS: 500.0000 mg | ORAL_TABLET | Freq: Four times a day (QID) | ORAL | Status: DC | PRN

## 2014-11-12 MED ORDER — HYDROCODONE-ACETAMINOPHEN 5-325 MG PO TABS: 1.0000 | ORAL_TABLET | Freq: Four times a day (QID) | ORAL | Status: DC

## 2014-11-12 NOTE — Telephone Encounter (Signed)
RX printed, left up front and patient aware to pick up  

## 2014-11-24 ENCOUNTER — Other Ambulatory Visit: Payer: Self-pay | Admitting: Family Medicine

## 2014-11-24 ENCOUNTER — Other Ambulatory Visit: Payer: Self-pay | Admitting: Cardiology

## 2014-12-11 ENCOUNTER — Telehealth: Payer: Self-pay | Admitting: Family Medicine

## 2014-12-11 NOTE — Telephone Encounter (Signed)
ok 

## 2014-12-11 NOTE — Telephone Encounter (Signed)
?   OK to Refill  

## 2014-12-11 NOTE — Telephone Encounter (Signed)
Patient calling to get rx for her hydrocodone  [phone] when ready

## 2014-12-12 MED ORDER — HYDROCODONE-ACETAMINOPHEN 5-325 MG PO TABS: 1.0000 | ORAL_TABLET | Freq: Four times a day (QID) | ORAL | Status: DC

## 2014-12-12 NOTE — Telephone Encounter (Signed)
RX printed, left up front and patient aware to pick up via vm 

## 2014-12-16 ENCOUNTER — Other Ambulatory Visit: Payer: Self-pay | Admitting: Family Medicine

## 2014-12-16 DIAGNOSIS — K861 Other chronic pancreatitis: Secondary | ICD-10-CM

## 2014-12-16 MED ORDER — ROSUVASTATIN CALCIUM 40 MG PO TABS: 40.0000 mg | ORAL_TABLET | Freq: Every day | ORAL | Status: DC

## 2014-12-16 MED ORDER — ONDANSETRON HCL 4 MG PO TABS: 4.0000 mg | ORAL_TABLET | Freq: Three times a day (TID) | ORAL | Status: DC | PRN

## 2014-12-16 NOTE — Telephone Encounter (Signed)
Medication called/sent to requested pharmacy  

## 2014-12-18 ENCOUNTER — Other Ambulatory Visit: Payer: Self-pay | Admitting: Cardiovascular Disease

## 2014-12-18 DIAGNOSIS — I739 Peripheral vascular disease, unspecified: Secondary | ICD-10-CM

## 2014-12-23 ENCOUNTER — Other Ambulatory Visit: Payer: Self-pay | Admitting: Cardiology

## 2014-12-26 ENCOUNTER — Ambulatory Visit (HOSPITAL_BASED_OUTPATIENT_CLINIC_OR_DEPARTMENT_OTHER)
Admission: RE | Admit: 2014-12-26 | Discharge: 2014-12-26 | Disposition: A | Discharge status: Home / Self Care | Payer: 59 | Source: Ambulatory Visit | Attending: Cardiology | Admitting: Cardiology

## 2014-12-26 ENCOUNTER — Ambulatory Visit (HOSPITAL_COMMUNITY)
Admission: RE | Admit: 2014-12-26 | Discharge: 2014-12-26 | Disposition: A | Discharge status: Home / Self Care | Payer: 59 | Source: Ambulatory Visit | Attending: Cardiology | Admitting: Cardiology

## 2014-12-26 DIAGNOSIS — I739 Peripheral vascular disease, unspecified: Secondary | ICD-10-CM | POA: Insufficient documentation

## 2014-12-26 DIAGNOSIS — E119 Type 2 diabetes mellitus without complications: Secondary | ICD-10-CM | POA: Diagnosis not present

## 2014-12-26 DIAGNOSIS — I10 Essential (primary) hypertension: Secondary | ICD-10-CM | POA: Insufficient documentation

## 2014-12-26 DIAGNOSIS — E785 Hyperlipidemia, unspecified: Secondary | ICD-10-CM | POA: Diagnosis not present

## 2014-12-30 ENCOUNTER — Other Ambulatory Visit: Payer: Self-pay | Admitting: Family Medicine

## 2014-12-30 NOTE — Telephone Encounter (Signed)
Give 1 refill,. She needs OV with Dr. Tanya NonesPickard

## 2014-12-30 NOTE — Telephone Encounter (Signed)
Received fax requesting refill on Robaxin 500mg   - ? OK to Refill  - LOV - 03/27/14 LRF - 11/12/14 #40 +1

## 2015-01-01 MED ORDER — METHOCARBAMOL 500 MG PO TABS: 500.0000 mg | ORAL_TABLET | Freq: Four times a day (QID) | ORAL | Status: DC | PRN

## 2015-01-01 NOTE — Telephone Encounter (Signed)
Prescription sent to pharmacy.   Letter sent.  

## 2015-01-12 ENCOUNTER — Other Ambulatory Visit: Payer: Self-pay | Admitting: Family Medicine

## 2015-01-12 ENCOUNTER — Other Ambulatory Visit: Payer: Self-pay | Admitting: Cardiology

## 2015-01-14 ENCOUNTER — Other Ambulatory Visit: Payer: Self-pay | Admitting: Family Medicine

## 2015-01-16 ENCOUNTER — Ambulatory Visit (INDEPENDENT_AMBULATORY_CARE_PROVIDER_SITE_OTHER): Payer: 59 | Admitting: Family Medicine

## 2015-01-16 ENCOUNTER — Encounter: Payer: Self-pay | Admitting: Family Medicine

## 2015-01-16 VITALS — BP 130/68 | HR 80 | Temp 97.5°F | Resp 16 | Ht 62.75 in | Wt 192.0 lb

## 2015-01-16 DIAGNOSIS — E119 Type 2 diabetes mellitus without complications: Secondary | ICD-10-CM

## 2015-01-16 DIAGNOSIS — E785 Hyperlipidemia, unspecified: Secondary | ICD-10-CM

## 2015-01-16 DIAGNOSIS — K861 Other chronic pancreatitis: Secondary | ICD-10-CM | POA: Diagnosis not present

## 2015-01-16 DIAGNOSIS — I10 Essential (primary) hypertension: Secondary | ICD-10-CM

## 2015-01-16 DIAGNOSIS — I2510 Atherosclerotic heart disease of native coronary artery without angina pectoris: Secondary | ICD-10-CM | POA: Diagnosis not present

## 2015-01-16 MED ORDER — HYDROCODONE-ACETAMINOPHEN 5-325 MG PO TABS: 1.0000 | ORAL_TABLET | Freq: Four times a day (QID) | ORAL | Status: DC

## 2015-01-16 MED ORDER — METHOCARBAMOL 500 MG PO TABS: 500.0000 mg | ORAL_TABLET | Freq: Four times a day (QID) | ORAL | Status: DC | PRN

## 2015-01-16 NOTE — Progress Notes (Signed)
Subjective:    Patient ID: [name], female    DOB: 1956-01-31, 59 y.o.   MRN: [id number]  HPI  2/16 Patient is here today to establish care. She has a very complicated past medical history including coronary artery disease and peripheral artery disease. She is currently managed medically. Patient specifically states that her arteries are not amenable to percutaneous coronary intervention. She is on aspirin and Plavix, Imdur, a statin, beta blocker, and ACE inhibitor. She recently had a stress test to clear her for her upcoming surgery and per the patient report her stress test was negative. She also has a chronic history of asthma that she has had since childhood. This is complicated by long-standing tobacco abuse. She states that she quit smoking several years ago. Her history states that she has COPD but she is adamant that it is asthma. She is currently managed with Advair twice a day and albuterol as needed. She uses albuterol 1-2 times per week. She also has long-standing history of episodes of pancreatitis. Patient states that she quit drinking approximately 20 years ago. She states that she was not a heavy drinker but that she did indulge in alcohol at that time. That is when the pancreatitis began. Ever since she has episodes of pancreatitis. The patient is not sure what triggers her pancreatitis. Patient was scheduled for a HIDA scan but that got canceled for some reason. At the present time she treats episodes of pancreatitis with bowel rest and pain medication. She also had a right knee replacement. She has chronic pain in both knees. Her orthopedist is scheduling her for surgery to evaluate her right knee replacement and also perform a left total knee replacement. She has never had a colonoscopy. She refuses a flu shot. She had Pneumovax 23 in 2011. She is due for Prevnar 13. She had a mammogram is up-to-date. Her Pap smear was performed last year. She states that she also has pain in her  right leg due to nerve damage.  AT that time, my plan was: Patient certainly has diabetes. I would like to check a CMP, fasting lipid panel, hemoglobin A1c, and urine microalbumin to evaluate the control of her diabetes. She is already on an aspirin due to her coronary artery disease. Given her asthma and history of smoking I will evaluate to see if the patient has COPD by performing pulmonary function test. She may benefit from a long-acting anticholinergic such as Spiriva if there is an element of COPD. I would like to check a fasting lipid panel because her goal LDL cholesterol is less than 70. Per her orthopedist's request, I will check a sedimentation rate as well as a CRP to evaluate the pain in both knees. I will continue to prescribe her pain medication at the current dose. She also has a goiter which is a coincidental finding on examination. I will schedule the patient for an ultrasound of her neck to evaluate further. I recommended a pneumonia vaccine, Prevnar 13, but she refused today. She also refused referral for colonoscopy as well as a flu shot. The remainder of her preventative care is up-to-date. Her blood pressure is controlled..  Pulmonary function tests show an FEV1 to FVC ratio 72% which is consistent with mild obstruction, , FEV1 is 1.82 L which is 75% of predicted. 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 Also mentioned increasing her pain medication for her  knee. I explained to the patient that I would be willing to continue the current dose that she is taking however she wants to increase the amount of narcotics she is taking due to the volume of narcotics that she's taking I would recommend a pain clinic referral. She states that she will discuss that with me further after the issues with her knee or dealt with.  01/16/15  patient is here today at my request for follow-up. She is overdue for a diabetes  check. She reports hypoglycemic episodes less than once a week. She states that her blood sugars are typically very well controlled under 100 in the morning. She denies any hypoglycemic episodes. She denies any chest pain or shortness of breath or dyspnea on exertion. Given her complicated history and her history of COPD, I have recommended a flu shot which she declines. Also recommended a colonoscopy which she declines. She is overdue for fasting lipid panel. She denies any myalgias or right upper quadrant pain on  Crestor 40 mg by mouth daily. Past Medical History  Diagnosis Date  . Dyslipidemia   . Tobacco use disorder     quit 11/10  . Osteoarthritis     severe right knee  R TKR  . Asthma with COPD (HCC)   . Pancreatitis 2003  . Diabetes mellitus with circulatory complication (HCC)   . Complication of anesthesia     slow to wake up with last surgery in 2011   . Hypertension   . PAD (peripheral artery disease) (HCC)     a. Severe stenosis mid right SFA s/p atherectomy 03/25/09. b. peripheral angiography in 12/2011 which showed only about 50% diffuse right SFA stenosis.  Marland Kitchen Headache     hx of migraines   . CAD (coronary artery disease)     a. LHC 2011: Diffuse distal and branch vessel disease - patient managed medically, no interventional options. b. Nuc 03/2014 - low risk, no ischemia.  . Renal artery stenosis (HCC)     a. LHC 2011 - 40-50% left RAS.   Past Surgical History  Procedure Laterality Date  . Joint replacement  2011    right knee replacement   . Tubal ligation  1980  . Abdominal aortagram N/A 12/21/2011    Procedure: ABDOMINAL Ronny Flurry;  Surgeon: Iran Ouch, MD;  Location: [facility] CATH LAB;  Service: Cardiovascular;  Laterality: N/A;  . Cardiac catheterization    . Total knee arthroplasty Left 05/22/2014    Procedure: LEFT TOTAL KNEE ARTHROPLASTY;  Surgeon: Ranee Gosselin, MD;  Location: [facility] ORS;  Service: Orthopedics;  Laterality: Left;   Current Outpatient Prescriptions  on File Prior to Visit  Medication Sig Dispense Refill  . acidophilus (RISAQUAD) CAPS capsule Take 1 capsule by mouth daily.    Marland Kitchen albuterol (PROVENTIL HFA;VENTOLIN HFA) 108 (90 BASE) MCG/ACT inhaler Inhale 2 puffs into the lungs 2 (two) times daily as needed for wheezing or shortness of breath. 3 Inhaler 5  . aspirin 81 MG tablet Take 81 mg by mouth daily.      . bisoprolol-hydrochlorothiazide (ZIAC) 2.5-6.25 MG tablet TAKE 1 TABLET BY MOUTH EVERY DAY 90 tablet 0  . clopidogrel (PLAVIX) 75 MG tablet Take 1 tablet (75 mg total) by mouth daily. 30 tablet 6  . diphenhydrAMINE (BENADRYL) 25 mg capsule Take 25 mg by mouth every 6 (six) hours as needed for allergies.    . fish oil-omega-3 fatty acids 1000 MG capsule Take 2 capsules (2 g total) by mouth 2 (two)  times daily. 360 capsule 3  . fluticasone (FLONASE) 50 MCG/ACT nasal spray Place into both nostrils daily.    . Fluticasone-Salmeterol (ADVAIR DISKUS) 250-50 MCG/DOSE AEPB Inhale 1 puff into the lungs every 12 (twelve) hours. 180 each 5  . glimepiride (AMARYL) 4 MG tablet Take 1 tablet (4 mg total) by mouth daily with lunch. 90 tablet 4  . glucose blood test strip     . glucose blood test strip Check bs qam and prn (Pt has one touch berio meter) 100 each 5  . isosorbide mononitrate (IMDUR) 60 MG 24 hr tablet TAKE 1 TABLET BY MOUTH DAILY 90 tablet 0  . lisinopril (PRINIVIL,ZESTRIL) 10 MG tablet Take 1 tablet (10 mg total) by mouth daily. 90 tablet 4  . metFORMIN (GLUCOPHAGE) 1000 MG tablet Take 1 tablet (1,000 mg total) by mouth 2 (two) times daily with a meal. 60 tablet 11  . Multiple Vitamin (MULTIVITAMIN) tablet Take 1 tablet by mouth daily.      Marland Kitchen omega-3 acid ethyl esters (LOVAZA) 1 G capsule Take 2 capsules (2 g total) by mouth 2 (two) times daily. 120 capsule 11  . ondansetron (ZOFRAN) 4 MG tablet Take 1 tablet (4 mg total) by mouth every 8 (eight) hours as needed for nausea or vomiting. 30 tablet 3  . pantoprazole (PROTONIX) 40 MG tablet  Take 1 tablet (40 mg total) by mouth daily. 30 tablet 11  . pioglitazone (ACTOS) 30 MG tablet Take 1 tablet (30 mg total) by mouth daily. 30 tablet 3  . rosuvastatin (CRESTOR) 40 MG tablet Needs office visit and labs before further refills 30 tablet 0  . zolpidem (AMBIEN) 10 MG tablet Take 10 mg by mouth 3 times/day as needed-between meals & bedtime.  0   No current facility-administered medications on file prior to visit.   Allergies  Allergen Reactions  . Glucotrol [Glipizide] Rash    Red rash  . Influenza Vaccines Other (See Comments)    Significant arm soreness requiring 1 year of physical therapy  . Latex Rash  . Milk-Related Compounds Rash    Fever  . Tetanus Toxoid Hives, Swelling and Rash    Swelling to site of injection with rash and fever  . Gabapentin   . Invokana [Canagliflozin] Other (See Comments)    Yeast Infections  . Nortriptyline    Social History   Social History  . Marital Status: Married    Spouse Name: N/A  . Number of Children: N/A  . Years of Education: 10   Occupational History  . Not on file.   Social History Main Topics  . Smoking status: Former Smoker    Quit date: 12/13/2008  . Smokeless tobacco: Never Used  . Alcohol Use: No  . Drug Use: No  . Sexual Activity: Not on file   Other Topics Concern  . Not on file   Social History Narrative   Denies any IV drug use or marijuana use.  Former smoker for 30 years, quit Nov. 2010. She is married with 2 children. Disabled from knee, not working.   Family History  Problem Relation Age of Onset  . Emphysema Father   . CVA Mother      Review of Systems  All other systems reviewed and are negative.      Objective:   Physical Exam  Constitutional: She is oriented to person, place, and time. She appears well-developed and well-nourished. No distress.  HENT:  Right Ear: External ear normal.  Left Ear: External  ear normal.  Nose: Nose normal.  Mouth/Throat: Oropharynx is clear and moist.    Eyes: Conjunctivae are normal.  Neck: Neck supple. No JVD present. Thyromegaly present.  Cardiovascular: Normal rate, regular rhythm and normal heart sounds.   No murmur heard. Pulmonary/Chest: Effort normal and breath sounds normal. She has no wheezes. She has no rales. She exhibits no tenderness.  Abdominal: Soft. Bowel sounds are normal. She exhibits no distension and no mass. There is no tenderness. There is no rebound and no guarding.  Musculoskeletal: She exhibits no edema.  Lymphadenopathy:    She has no cervical adenopathy.  Neurological: She is alert and oriented to person, place, and time. She has normal reflexes. No cranial nerve deficit. She exhibits normal muscle tone. Coordination normal.  Skin: No rash noted. She is not diaphoretic. No erythema.  Vitals reviewed.         Assessment & Plan:  ASCVD (arteriosclerotic cardiovascular disease)  Type 2 diabetes mellitus without complication, without long-term current use of insulin (HCC) - Plan: COMPLETE METABOLIC PANEL WITH GFR, Lipid panel, Hemoglobin A1c, Microalbumin, urine  Chronic pancreatitis, unspecified pancreatitis type (HCC)  Benign essential HTN  HLD (hyperlipidemia)  patient refuses a colonoscopy. She refuses a flu shot. I will check a hemoglobin A1c today. Goal hemoglobin A1c is less than 6.5. I recommended discontinuation of glimepiride if she is having hypoglycemic episodes. However she is very hesitant to switch medications because she is on limited income and therefore she elects to continue the Amaryl. I asked her to come back fasting so that I can check a fasting lipid panel. I will also check a urine microalbumin and if elevated I will increase her lisinopril. Blood pressure is acceptable today

## 2015-01-17 ENCOUNTER — Other Ambulatory Visit: Payer: Self-pay | Admitting: Family Medicine

## 2015-01-17 LAB — COMPLETE METABOLIC PANEL WITH GFR
ALBUMIN: 3.9 g/dL (ref 3.6–5.1)
ALK PHOS: 61 U/L (ref 33–130)
ALT: 16 U/L (ref 6–29)
AST: 17 U/L (ref 10–35)
BILIRUBIN TOTAL: 0.2 mg/dL (ref 0.2–1.2)
BUN: 25 mg/dL (ref 7–25)
CALCIUM: 9.7 mg/dL (ref 8.6–10.4)
CO2: 24 mmol/L (ref 20–31)
Chloride: 106 mmol/L (ref 98–110)
Creat: 1.02 mg/dL (ref 0.50–1.05)
GFR, EST AFRICAN AMERICAN: 71 mL/min (ref >=60)
GFR, EST NON AFRICAN AMERICAN: 61 mL/min (ref >=60)
GLUCOSE: 69 mg/dL — AB (ref 70–99)
POTASSIUM: 4.5 mmol/L (ref 3.5–5.3)
SODIUM: 140 mmol/L (ref 135–146)
Total Protein: 6.3 g/dL (ref 6.1–8.1)

## 2015-01-17 LAB — MICROALBUMIN, URINE: MICROALB UR: 0.8 mg/dL

## 2015-01-17 LAB — HEMOGLOBIN A1C
HEMOGLOBIN A1C: 6.4 % — AB (ref <5.7)
MEAN PLASMA GLUCOSE: 137 mg/dL — AB (ref <117)

## 2015-01-18 ENCOUNTER — Other Ambulatory Visit: Payer: Self-pay | Admitting: Cardiology

## 2015-01-19 ENCOUNTER — Encounter: Payer: Self-pay | Admitting: Family Medicine

## 2015-01-20 ENCOUNTER — Other Ambulatory Visit: Payer: 59

## 2015-01-20 LAB — LIPID PANEL
Cholesterol: 148 mg/dL (ref 125–200)
HDL: 51 mg/dL (ref >=46)
LDL CALC: 64 mg/dL (ref <130)
Total CHOL/HDL Ratio: 2.9 Ratio (ref <=5.0)
Triglycerides: 163 mg/dL — ABNORMAL HIGH (ref <150)
VLDL: 33 mg/dL — ABNORMAL HIGH (ref <30)

## 2015-01-28 ENCOUNTER — Other Ambulatory Visit: Payer: Self-pay | Admitting: Family Medicine

## 2015-01-30 ENCOUNTER — Other Ambulatory Visit: Payer: Self-pay | Admitting: Family Medicine

## 2015-01-30 NOTE — Telephone Encounter (Signed)
Medication refilled per protocol. 

## 2015-02-11 ENCOUNTER — Telehealth: Payer: Self-pay | Admitting: Family Medicine

## 2015-02-11 NOTE — Telephone Encounter (Signed)
?   OK to Refill  

## 2015-02-11 NOTE — Telephone Encounter (Signed)
Pt is calling for a refill of Hydrocodone.  Please call [phone]

## 2015-02-12 MED ORDER — HYDROCODONE-ACETAMINOPHEN 5-325 MG PO TABS: 1.0000 | ORAL_TABLET | Freq: Four times a day (QID) | ORAL | Status: DC

## 2015-02-12 NOTE — Telephone Encounter (Signed)
ok 

## 2015-02-12 NOTE — Telephone Encounter (Signed)
RX printed, left up front and patient aware to pick up via vm 

## 2015-03-06 ENCOUNTER — Other Ambulatory Visit: Payer: Self-pay | Admitting: Family Medicine

## 2015-03-06 ENCOUNTER — Other Ambulatory Visit: Payer: Self-pay | Admitting: Cardiology

## 2015-03-06 ENCOUNTER — Encounter: Payer: Self-pay | Admitting: *Deleted

## 2015-03-06 DIAGNOSIS — E119 Type 2 diabetes mellitus without complications: Secondary | ICD-10-CM

## 2015-03-12 ENCOUNTER — Telehealth: Payer: Self-pay | Admitting: Family Medicine

## 2015-03-12 NOTE — Telephone Encounter (Signed)
Ok

## 2015-03-12 NOTE — Telephone Encounter (Signed)
?   OK to Refill  

## 2015-03-12 NOTE — Telephone Encounter (Signed)
Pt reeds a refill of Hydrocodone 5-325 [phone]

## 2015-03-13 MED ORDER — HYDROCODONE-ACETAMINOPHEN 5-325 MG PO TABS: 1.0000 | ORAL_TABLET | Freq: Four times a day (QID) | ORAL | Status: DC

## 2015-03-13 NOTE — Telephone Encounter (Signed)
RX printed, left up front and patient aware to pick up after 2 pm  

## 2015-03-16 ENCOUNTER — Other Ambulatory Visit: Payer: Self-pay | Admitting: Family Medicine

## 2015-03-16 NOTE — Telephone Encounter (Signed)
Refill appropriate and filled per protocol. 

## 2015-03-19 ENCOUNTER — Other Ambulatory Visit: Payer: Self-pay | Admitting: Family Medicine

## 2015-03-19 ENCOUNTER — Ambulatory Visit (INDEPENDENT_AMBULATORY_CARE_PROVIDER_SITE_OTHER): Payer: BLUE CROSS/BLUE SHIELD | Admitting: Cardiology

## 2015-03-19 ENCOUNTER — Encounter: Payer: Self-pay | Admitting: Cardiology

## 2015-03-19 VITALS — BP 130/64 | HR 78 | Ht 62.75 in | Wt 193.1 lb

## 2015-03-19 DIAGNOSIS — I2510 Atherosclerotic heart disease of native coronary artery without angina pectoris: Secondary | ICD-10-CM | POA: Diagnosis not present

## 2015-03-19 DIAGNOSIS — I739 Peripheral vascular disease, unspecified: Secondary | ICD-10-CM

## 2015-03-19 NOTE — Patient Instructions (Signed)
Medication Instructions:  Your physician recommends that you continue on your current medications as directed. Please refer to the Current Medication list given to you today.   Labwork: None   Testing/Procedures: None   Follow-Up: Your physician recommends that you schedule a follow-up appointment in: March 2017 with Dr Kirke Corin.  Your physician wants you to follow-up in: 6 months with Dr Shirlee Latch. (August 2017). You will receive a reminder letter in the mail two months in advance. If you don't receive a letter, please call our office to schedule the follow-up appointment.     If you need a refill on your cardiac medications before your next appointment, please call your pharmacy.

## 2015-03-19 NOTE — Progress Notes (Signed)
Patient ID: [name], female   DOB: 1955-12-26, 60 y.o.   MRN: [id number] PCP: Dr. Tanya Nones  60 yo WF with h/o PVD, CAD,  DM, HTN, and hyperlipidemia is here for followup. Steffanie Dunn was done in 6/13 and showed no ischemia or infarction.  In 10/13, she had lower extremity arterial dopplers done.  This showed > 50% stenosis in the right proximal SFA.  ABIs showed fall from 0.95 to 0.65 on the right with exercise. She saw Dr. Kirke Corin and had peripheral angiography in 11/13 with only about 50% diffuse right SFA stenosis.  Her leg pain was out of proportion to the degree of PAD.  She was on cilostazol without much effect, so it was stopped. Last Cardiolite was in 2/16, again low risk with no ischemia.   She has now had left and right TKR, but still has a lot of pain in her knees.  She also gets calf pain afte walking about 1/8 mile that may be claudication.  No chest pain.  No exertional dyspnea.  Main limitation is from leg and knee pain.    Labs (10/11): LFTs normal Labs (4/12): LDL 73, HDL 54, triglycerides 340 Labs (9/12): TGs 335, HDL 47, LDL 55 Labs (6/13): HDL 55, LDL 55, TGs 274 Labs (10/13): K 4.5, creatinine 1.24 Labs (6/14): LDL 36, HDL 47 Labs (11/14): K 4.7, creatinine 1.11 Labs (12/16): LDL 64, HDL 51, K 4.5, creatinine 8.29  ECG: NSR, normal  Allergies (verified):  1)  ! * Tetanus 2)  ! * Latex 3)  ! * Milk  Past History:  Past Medical History: 1. CAD: ETT-myoview (12/10) with apical anterior ischemia, EF 75%.  LHC (1/11) showed diffuse disease.  There was a large D3 with serial 70%, 80-90%, and 70% stenoses then severe diffuse distal vessel disease.  Small to moderate OM1 with severe diffuse disease, 50% mRCA, 95% mid small PLV, 60% PDA.  EF 60%.  Patient managed medically, no interventional options.  Lexiscan myoview (6/13) with EF 74%, no ischemia or infarction.  Lexiscan Cardiolite (2/16) with EF 67%, no ischemia, low risk study.  2. DYSLIPIDEMIA  3. HYPERTENSION  4.  DIABETES MELLITUS, TYPE II 5. TOBACCO ABUSE : Quit 11/10.  6. Severe osteoarthritis knees, now s/p R and L TKR.  7. Asthma/COPD 8. PAD: Severe stenosis mid right SFA s/p atherectomy 03/25/09. Moderate disease diffusely throughout the iliacs, bilateral SFA, below knee vessels.  ABIs post-procedure were 0.85 on right and 0.99 on left.  Repeat ABIs (8/11): 0.88 right, 1.2 left.  ABIs (3/12): 0.85 right, 0.99 left.  ABIs (4/13): 0.81 right, 0.98 left. Peripheral arterial dopplers (10/13) with > 50% proximal right SFA stenosis.  ABIs on right worsened from 0.95 => 0.65 with exercise.  Peripheral angiography (11/13): 50% diffuse right SFA stenosis. ABIs (6/14): 0.88 right, 0.96 left. ABIs (8/15) 0.87 right, 1.1 left. Cilostazol did not help.  Peripheral arterial dopplers (11/16) with stable elevated velocities right SFA, ABI 0.9 R/1.1 L.   9. Chronic pancreatitis   Family History: Mother alive with CVA Father deceased with emphysema  Social History: No etoh-used to drink. Denies any IV drug use or marijuana use.   Former smoker for 30 years, quit November 2010. She is married with two children. Disabled from knee, not working.     Current Outpatient Prescriptions  Medication Sig Dispense Refill  . albuterol (PROVENTIL HFA;VENTOLIN HFA) 108 (90 BASE) MCG/ACT inhaler Inhale 2 puffs into the lungs 2 (two) times daily as needed for wheezing  or shortness of breath. 3 Inhaler 5  . aspirin 81 MG tablet Take 81 mg by mouth daily.      . bisoprolol-hydrochlorothiazide (ZIAC) 2.5-6.25 MG tablet TAKE 1 TABLET BY MOUTH EVERY DAY 90 tablet 0  . clopidogrel (PLAVIX) 75 MG tablet TAKE 1 TABLET BY MOUTH EVERY DAY 30 tablet 0  . diphenhydrAMINE (BENADRYL) 25 mg capsule Take 25 mg by mouth every 6 (six) hours as needed for allergies.    . fish oil-omega-3 fatty acids 1000 MG capsule Take 2 capsules (2 g total) by mouth 2 (two) times daily. 360 capsule 3  . Fluticasone-Salmeterol (ADVAIR DISKUS) 250-50 MCG/DOSE  AEPB Inhale 1 puff into the lungs every 12 (twelve) hours. 180 each 5  . glimepiride (AMARYL) 4 MG tablet TAKE 1 TABLET BY MOUTH EVERY DAY WITH LUNCH 90 tablet 3  . glucose blood test strip Check bs qam and prn (Pt has one touch berio meter) 100 each 5  . HYDROcodone-acetaminophen (NORCO/VICODIN) 5-325 MG tablet Take 1 tablet by mouth 4 (four) times daily. 120 tablet 0  . lisinopril (PRINIVIL,ZESTRIL) 10 MG tablet Take 1 tablet (10 mg total) by mouth daily. 90 tablet 4  . metFORMIN (GLUCOPHAGE) 1000 MG tablet TAKE 1 TABLET BY MOUTH TWICE DAILY WITH MEALS 180 tablet 0  . methocarbamol (ROBAXIN) 500 MG tablet Take 1 tablet (500 mg total) by mouth every 6 (six) hours as needed for muscle spasms. 120 tablet 3  . Multiple Vitamin (MULTIVITAMIN) tablet Take 1 tablet by mouth daily.      . ondansetron (ZOFRAN) 4 MG tablet Take 1 tablet (4 mg total) by mouth every 8 (eight) hours as needed for nausea or vomiting. 30 tablet 3  . pantoprazole (PROTONIX) 40 MG tablet Take 1 tablet (40 mg total) by mouth daily. 30 tablet 11  . pioglitazone (ACTOS) 30 MG tablet TAKE 1 TABLET(30 MG) BY MOUTH DAILY 30 tablet 3  . rosuvastatin (CRESTOR) 40 MG tablet Take 1 tablet (40 mg total) by mouth daily. 30 tablet 5  . isosorbide mononitrate (IMDUR) 60 MG 24 hr tablet TAKE 1 TABLET BY MOUTH DAILY 90 tablet 0  . isosorbide mononitrate (IMDUR) 60 MG 24 hr tablet TAKE 1 TABLET BY MOUTH DAILY 90 tablet 0   No current facility-administered medications for this visit.    BP 130/64 mmHg  Pulse 78  Ht 5' 2.75" (1.594 m)  Wt 193 lb 1.9 oz (87.599 kg)  BMI 34.48 kg/m2 General: NAD Neck: No JVD, no thyromegaly or thyroid nodule.  Lungs: Clear to auscultation bilaterally with normal respiratory effort. CV: Nondisplaced PMI.  Heart regular S1/S2, no S3/S4, no murmur.  No peripheral edema.  No carotid bruit.  2+ PT on left, unable to feel pulses on right.  Abdomen: Soft, nontender, no hepatosplenomegaly, no distention.   Neurologic: Alert and oriented x 3.  Psych: Normal affect. Extremities: No clubbing or cyanosis.   Assessment/Plan:  CAD  No recent chest pain. She had diffuse branch vessel disease on 2011 cath not amenable to PCI. Lexiscan myoview in 2/16 showed no ischemia. Continue ASA 81, bisoprolol, lisinopril, Imdur, Plavix, and statin. DYSLIPIDEMIA  Continue statin, good lipids 12/16.  PAD Claudication-like leg pain after walking about 1/8 mile.  Cilostazol did not help much and was stopped.  Peripheral angiography showed 50% right SFA stenosis in 11/13, but pain seemed out of proportion to PAD.  ABIs in 11/16 actually were near normal.  I will arrange PV followup for her with Dr. Kirke Corin.  Marca Ancona 03/19/2015

## 2015-04-05 ENCOUNTER — Other Ambulatory Visit: Payer: Self-pay | Admitting: Cardiology

## 2015-04-07 ENCOUNTER — Other Ambulatory Visit: Payer: Self-pay | Admitting: Family Medicine

## 2015-04-07 NOTE — Telephone Encounter (Signed)
Medication refilled per protocol. 

## 2015-04-09 ENCOUNTER — Telehealth: Payer: Self-pay | Admitting: Family Medicine

## 2015-04-09 NOTE — Telephone Encounter (Signed)
Pt is calling for a refill of Hydrocodone. [phone]

## 2015-04-10 NOTE — Telephone Encounter (Signed)
?   OK to Refill  

## 2015-04-10 NOTE — Telephone Encounter (Signed)
ok 

## 2015-04-10 NOTE — Telephone Encounter (Signed)
Patient is also calling also to speak to you regarding her meter and her strips  Not being covered by insurance  [phone]

## 2015-04-13 MED ORDER — HYDROCODONE-ACETAMINOPHEN 5-325 MG PO TABS: 1.0000 | ORAL_TABLET | Freq: Four times a day (QID) | ORAL | Status: DC

## 2015-04-13 NOTE — Telephone Encounter (Signed)
RX printed, left up front and patient aware to pick up after 2 pm via vm and to call ins to find out what ins will cover.

## 2015-04-14 ENCOUNTER — Telehealth: Payer: Self-pay | Admitting: Family Medicine

## 2015-04-14 NOTE — Telephone Encounter (Signed)
Patient calling to ask if she needs a referral to her new vascular doctor, his name is dr Kirke Corin

## 2015-04-15 NOTE — Telephone Encounter (Signed)
Pending auth

## 2015-04-16 NOTE — Telephone Encounter (Signed)
lmtrc

## 2015-04-16 NOTE — Telephone Encounter (Signed)
Pt has [employer] should not need a referral

## 2015-04-17 ENCOUNTER — Other Ambulatory Visit: Payer: Self-pay | Admitting: Family Medicine

## 2015-04-17 NOTE — Telephone Encounter (Signed)
Refill appropriate and filled per protocol. 

## 2015-04-19 ENCOUNTER — Other Ambulatory Visit: Payer: Self-pay | Admitting: Cardiology

## 2015-04-19 ENCOUNTER — Other Ambulatory Visit: Payer: Self-pay | Admitting: Family Medicine

## 2015-04-20 ENCOUNTER — Other Ambulatory Visit: Payer: Self-pay | Admitting: Family Medicine

## 2015-04-24 ENCOUNTER — Other Ambulatory Visit: Payer: Self-pay | Admitting: Family Medicine

## 2015-04-24 MED ORDER — PANTOPRAZOLE SODIUM 40 MG PO TBEC: 40.0000 mg | DELAYED_RELEASE_TABLET | Freq: Every day | ORAL | Status: DC

## 2015-05-01 ENCOUNTER — Other Ambulatory Visit: Payer: Self-pay | Admitting: Family Medicine

## 2015-05-01 NOTE — Telephone Encounter (Signed)
ok 

## 2015-05-01 NOTE — Telephone Encounter (Signed)
Prescription sent to pharmacy.

## 2015-05-01 NOTE — Telephone Encounter (Signed)
Ok to refill 

## 2015-05-05 ENCOUNTER — Encounter: Payer: Self-pay | Admitting: Cardiovascular Disease

## 2015-05-05 ENCOUNTER — Ambulatory Visit (INDEPENDENT_AMBULATORY_CARE_PROVIDER_SITE_OTHER): Payer: BLUE CROSS/BLUE SHIELD | Admitting: Cardiovascular Disease

## 2015-05-05 VITALS — BP 128/60 | HR 72 | Ht 63.0 in | Wt 194.5 lb

## 2015-05-05 DIAGNOSIS — I739 Peripheral vascular disease, unspecified: Secondary | ICD-10-CM

## 2015-05-05 NOTE — Patient Instructions (Signed)
Medication Instructions:  Your physician recommends that you continue on your current medications as directed. Please refer to the Current Medication list given to you today.  Labwork: We will do lab work at the hospital.   Testing/Procedures: Your physician has requested that you have a peripheral vascular angiogram. This exam is performed at the hospital. During this exam IV contrast is used to look at arterial blood flow. Please review the information sheet given for details.  Follow-Up: Your physician recommends that you schedule a follow-up appointment in: 6 WEEKS with Dr Kirke CorinArida   Any Other Special Instructions Will Be Listed Below (If Applicable).     If you need a refill on your cardiac medications before your next appointment, please call your pharmacy.

## 2015-05-05 NOTE — Progress Notes (Signed)
Cardiology Office Note   Date:  05/06/2015   ID:  [name], DOB [birth year], MRN [id number]  PCP:  Leo Grosser, MD  Cardiologist:  Dr. Shirlee Latch  Chief Complaint  Patient presents with  . Bilateral leg pain    constant pain and swelling      History of Present Illness: [name] is a 60 y.o. female who presents for a follow up visit regarding PAD.  She has known history of peripheral arterial disease status post atherectomy of the right SFA in 2011 by Dr. Sanjuana Kava. She has other chronic medical conditions including CAD, DM, HTN, and hyperlipidemia.   She is a former smoker.  Angiography in 12/2011 showed moderate diffuse disease in R SFA (at most 50%). Three vessel run below the knee but with sluggish flow distally. No revascularization was performed. Cilostazol did not help.  She is s/p bilateral TKR.   Most recent noninvasive vascular evaluation in 12/2014 showed an ABI of 0.9 on the right and 1.1 on the left. Duplex showed mild RSFA disease with a peak velocity of 180.  She reports significant worsening of right calf claudication which is currently happening with minimal activities even when she is just trying to vacuum inside her house. This has significantly affected her ability to perform activities of daily living. She has no rest pain and no lower extremity ulceration.    Past Medical History  Diagnosis Date  . Dyslipidemia   . Tobacco use disorder     quit 11/10  . Osteoarthritis     severe right knee  R TKR  . Asthma with COPD (HCC)   . Pancreatitis 2003  . Diabetes mellitus with circulatory complication (HCC)   . Complication of anesthesia     slow to wake up with last surgery in 2011   . Hypertension   . PAD (peripheral artery disease) (HCC)     a. Severe stenosis mid right SFA s/p atherectomy 03/25/09. b. peripheral angiography in 12/2011 which showed only about 50% diffuse right SFA stenosis.  Marland Kitchen Headache     hx of migraines   . CAD (coronary  artery disease)     a. LHC 2011: Diffuse distal and branch vessel disease - patient managed medically, no interventional options. b. Nuc 03/2014 - low risk, no ischemia.  . Renal artery stenosis (HCC)     a. LHC 2011 - 40-50% left RAS.    Past Surgical History  Procedure Laterality Date  . Total knee arthroplasty Right 2011  . Tubal ligation  1980  . Abdominal aortagram N/A 12/21/2011    Procedure: ABDOMINAL Ronny Flurry;  Surgeon: Iran Ouch, MD;  Location: [facility] CATH LAB;  Service: Cardiovascular;  Laterality: N/A;  . Cardiac catheterization    . Total knee arthroplasty Left 05/22/2014    Procedure: LEFT TOTAL KNEE ARTHROPLASTY;  Surgeon: Ranee Gosselin, MD;  Location: [facility] ORS;  Service: Orthopedics;  Laterality: Left;     Current Outpatient Prescriptions  Medication Sig Dispense Refill  . albuterol (PROVENTIL HFA;VENTOLIN HFA) 108 (90 BASE) MCG/ACT inhaler Inhale 2 puffs into the lungs 2 (two) times daily as needed for wheezing or shortness of breath. 3 Inhaler 5  . aspirin 81 MG tablet Take 81 mg by mouth daily.      . bisoprolol-hydrochlorothiazide (ZIAC) 2.5-6.25 MG tablet TAKE 1 TABLET BY MOUTH EVERY DAY 90 tablet 0  . clopidogrel (PLAVIX) 75 MG tablet TAKE 1 TABLET BY MOUTH EVERY DAY 30 tablet 11  . diphenhydrAMINE (BENADRYL) 25  mg capsule Take 25 mg by mouth every 6 (six) hours as needed for allergies.    . fish oil-omega-3 fatty acids 1000 MG capsule Take 2 capsules (2 g total) by mouth 2 (two) times daily. 360 capsule 3  . Fluticasone-Salmeterol (ADVAIR DISKUS) 250-50 MCG/DOSE AEPB Inhale 1 puff into the lungs every 12 (twelve) hours. 180 each 5  . glimepiride (AMARYL) 4 MG tablet TAKE 1 TABLET BY MOUTH EVERY DAY WITH LUNCH 90 tablet 3  . glucose blood test strip Check bs qam and prn (Pt has one touch berio meter) 100 each 5  . HYDROcodone-acetaminophen (NORCO/VICODIN) 5-325 MG tablet Take 1 tablet by mouth 4 (four) times daily. 120 tablet 0  . isosorbide mononitrate (IMDUR) 60 MG  24 hr tablet TAKE 1 TABLET BY MOUTH DAILY 90 tablet 0  . isosorbide mononitrate (IMDUR) 60 MG 24 hr tablet TAKE 1 TABLET BY MOUTH DAILY 90 tablet 0  . lisinopril (PRINIVIL,ZESTRIL) 10 MG tablet Take 1 tablet (10 mg total) by mouth daily. 90 tablet 4  . metFORMIN (GLUCOPHAGE) 1000 MG tablet TAKE 1 TABLET BY MOUTH TWICE DAILY WITH MEALS 180 tablet 0  . methocarbamol (ROBAXIN) 500 MG tablet TAKE 1 TABLET BY MOUTH EVERY 6 HOURS AS NEEDED FOR MUSCLE SPASMS 120 tablet 0  . Multiple Vitamin (MULTIVITAMIN) tablet Take 1 tablet by mouth daily.      . ondansetron (ZOFRAN) 4 MG tablet TAKE 1 TABLET(4 MG) BY MOUTH EVERY 8 HOURS AS NEEDED FOR NAUSEA OR VOMITING 30 tablet 0  . pantoprazole (PROTONIX) 40 MG tablet Take 1 tablet (40 mg total) by mouth daily. 30 tablet 11  . pioglitazone (ACTOS) 30 MG tablet TAKE 1 TABLET(30 MG) BY MOUTH DAILY 30 tablet 3  . rosuvastatin (CRESTOR) 40 MG tablet Take 1 tablet (40 mg total) by mouth daily. 30 tablet 5   No current facility-administered medications for this visit.    Allergies:   Glucotrol; Influenza vaccines; Latex; Milk-related compounds; Tetanus toxoid; Gabapentin; Invokana; and Nortriptyline    Social History:  The patient  reports that she quit smoking about 6 years ago. She has never used smokeless tobacco. She reports that she does not drink alcohol or use illicit drugs.   Family History:  The patient's family history includes CVA in her mother; Emphysema in her father.    ROS:  Please see the history of present illness.   Otherwise, review of systems are positive for .   All other systems are reviewed and negative.    PHYSICAL EXAM: VS:  BP 128/60 mmHg  Pulse 72  Ht 5\' 3"  (1.6 m)  Wt 194 lb 8 oz (88.225 kg)  BMI 34.46 kg/m2 , BMI Body mass index is 34.46 kg/(m^2). GEN: Well nourished, well developed, in no acute distress HEENT: normal Neck: no JVD, carotid bruits, or masses Cardiac: RRR; no murmurs, rubs, or gallops,no edema  Respiratory:   clear to auscultation bilaterally, normal work of breathing GI: soft, nontender, nondistended, + BS MS: no deformity or atrophy Skin: warm and dry, no rash Neuro:  Strength and sensation are intact Psych: euthymic mood, full affect Vascular :  Femoral pulse is mildly diminished bilaterally.  EKG:  EKG is not ordered today.    Recent Labs: 07/17/2014: Hemoglobin 12.1; Platelets 321.0 01/16/2015: ALT 16; BUN 25; Creat 1.02; Potassium 4.5; Sodium 140    Lipid Panel    Component Value Date/Time   CHOL 148 01/20/2015 0844   TRIG 163* 01/20/2015 0844   HDL 51 01/20/2015  0844   CHOLHDL 2.9 01/20/2015 0844   VLDL 33* 01/20/2015 0844   LDLCALC 64 01/20/2015 0844   LDLDIRECT 50.6 01/02/2014 1003      Wt Readings from Last 3 Encounters:  05/05/15 194 lb 8 oz (88.225 kg)  03/19/15 193 lb 1.9 oz (87.599 kg)  01/16/15 192 lb (87.091 kg)       ASSESSMENT AND PLAN:  1.  PAD: Severe right calf claudication which is currently lifestyle limiting. She reports that her symptoms are much worse than when she was seen in 2013. Although her ABI was only mildly reduced, this might be underestimating the severity of disease. I discussed with her different management options including attempting a more intense walking program versus proceeding with angiography and possible endovascular intervention. She feels that her symptoms are currently very limited to the point of not being able to function. Some of her discomfort seemed to be neuropathic but she clearly has a claudication component. I discussed angiography with her including risks and benefits and she wants to proceed. She is already on dual antiplatelet therapy.  2. Coronary artery disease involving native coronary arteries without angina:  She is stable from a cardiac standpoint.  3. Hyperlipidemia:  Continue treatment with high-dose rosuvastatin. Most recent LDL was 64.    Disposition:   FU with me in 1 month  Signed,  Lorine BearsMuhammad Arida,  MD  05/06/2015 2:11 PM    [facility]

## 2015-05-07 ENCOUNTER — Telehealth: Payer: Self-pay | Admitting: Family Medicine

## 2015-05-07 MED ORDER — HYDROCODONE-ACETAMINOPHEN 5-325 MG PO TABS: 1.0000 | ORAL_TABLET | Freq: Four times a day (QID) | ORAL | Status: DC

## 2015-05-07 MED ORDER — BAYER CONTOUR MONITOR W/DEVICE KIT: PACK | Status: DC

## 2015-05-07 NOTE — Telephone Encounter (Signed)
Requesting refill on her hydrocodone - ? OK to Refill

## 2015-05-08 NOTE — Telephone Encounter (Signed)
ok 

## 2015-05-08 NOTE — Telephone Encounter (Signed)
RX printed, left up front and patient aware to pick up  

## 2015-05-21 ENCOUNTER — Other Ambulatory Visit: Payer: Self-pay | Admitting: Family Medicine

## 2015-05-24 ENCOUNTER — Other Ambulatory Visit: Payer: Self-pay | Admitting: Family Medicine

## 2015-05-25 ENCOUNTER — Other Ambulatory Visit: Payer: Self-pay | Admitting: Cardiology

## 2015-05-25 NOTE — Telephone Encounter (Signed)
?   OK to Refill  

## 2015-05-25 NOTE — Telephone Encounter (Signed)
ok 

## 2015-05-25 NOTE — Telephone Encounter (Signed)
Prescription sent to pharmacy.

## 2015-05-26 ENCOUNTER — Other Ambulatory Visit: Payer: Self-pay | Admitting: Cardiology

## 2015-05-27 ENCOUNTER — Ambulatory Visit (HOSPITAL_COMMUNITY)
Admission: RE | Admit: 2015-05-27 | Discharge: 2015-05-27 | Disposition: A | Discharge status: Home / Self Care | Payer: BLUE CROSS/BLUE SHIELD | Source: Ambulatory Visit | Attending: Cardiovascular Disease | Admitting: Cardiovascular Disease

## 2015-05-27 ENCOUNTER — Encounter (HOSPITAL_COMMUNITY)
Admission: RE | Disposition: A | Discharge status: Home / Self Care | Payer: Self-pay | Source: Ambulatory Visit | Attending: Cardiovascular Disease

## 2015-05-27 ENCOUNTER — Encounter (HOSPITAL_COMMUNITY): Payer: Self-pay | Admitting: Cardiovascular Disease

## 2015-05-27 ENCOUNTER — Other Ambulatory Visit: Payer: Self-pay | Admitting: Cardiology

## 2015-05-27 DIAGNOSIS — I2510 Atherosclerotic heart disease of native coronary artery without angina pectoris: Secondary | ICD-10-CM | POA: Insufficient documentation

## 2015-05-27 DIAGNOSIS — Z7982 Long term (current) use of aspirin: Secondary | ICD-10-CM | POA: Insufficient documentation

## 2015-05-27 DIAGNOSIS — J449 Chronic obstructive pulmonary disease, unspecified: Secondary | ICD-10-CM | POA: Insufficient documentation

## 2015-05-27 DIAGNOSIS — I10 Essential (primary) hypertension: Secondary | ICD-10-CM | POA: Diagnosis not present

## 2015-05-27 DIAGNOSIS — E785 Hyperlipidemia, unspecified: Secondary | ICD-10-CM | POA: Diagnosis not present

## 2015-05-27 DIAGNOSIS — Z7984 Long term (current) use of oral hypoglycemic drugs: Secondary | ICD-10-CM | POA: Diagnosis not present

## 2015-05-27 DIAGNOSIS — Z87891 Personal history of nicotine dependence: Secondary | ICD-10-CM | POA: Diagnosis not present

## 2015-05-27 DIAGNOSIS — Z96653 Presence of artificial knee joint, bilateral: Secondary | ICD-10-CM | POA: Insufficient documentation

## 2015-05-27 DIAGNOSIS — I70211 Atherosclerosis of native arteries of extremities with intermittent claudication, right leg: Secondary | ICD-10-CM | POA: Diagnosis not present

## 2015-05-27 DIAGNOSIS — I739 Peripheral vascular disease, unspecified: Secondary | ICD-10-CM

## 2015-05-27 DIAGNOSIS — E119 Type 2 diabetes mellitus without complications: Secondary | ICD-10-CM | POA: Diagnosis not present

## 2015-05-27 HISTORY — PX: PERIPHERAL VASCULAR CATHETERIZATION: SHX172C

## 2015-05-27 HISTORY — PX: LOWER EXTREMITY ANGIOGRAM: SHX5508

## 2015-05-27 LAB — BASIC METABOLIC PANEL
Anion gap: 12 (ref 5–15)
BUN: 28 mg/dL — AB (ref 6–20)
CHLORIDE: 103 mmol/L (ref 101–111)
CO2: 25 mmol/L (ref 22–32)
Calcium: 9.4 mg/dL (ref 8.9–10.3)
Creatinine, Ser: 1.06 mg/dL — ABNORMAL HIGH (ref 0.44–1.00)
GFR, EST NON AFRICAN AMERICAN: 57 mL/min — AB (ref >60)
Glucose, Bld: 109 mg/dL — ABNORMAL HIGH (ref 65–99)
POTASSIUM: 4.9 mmol/L (ref 3.5–5.1)
SODIUM: 140 mmol/L (ref 135–145)

## 2015-05-27 LAB — CBC WITH DIFFERENTIAL/PLATELET
BASOS PCT: 1 %
Basophils Absolute: 0 10*3/uL (ref 0.0–0.1)
EOS ABS: 0.3 10*3/uL (ref 0.0–0.7)
EOS PCT: 5 %
HCT: 37.4 % (ref 36.0–46.0)
HEMOGLOBIN: 12 g/dL (ref 12.0–15.0)
Lymphocytes Relative: 34 %
Lymphs Abs: 1.8 10*3/uL (ref 0.7–4.0)
MCH: 29 pg (ref 26.0–34.0)
MCHC: 32.1 g/dL (ref 30.0–36.0)
MCV: 90.3 fL (ref 78.0–100.0)
Monocytes Absolute: 0.5 10*3/uL (ref 0.1–1.0)
Monocytes Relative: 9 %
NEUTROS PCT: 51 %
Neutro Abs: 2.7 10*3/uL (ref 1.7–7.7)
PLATELETS: 289 10*3/uL (ref 150–400)
RBC: 4.14 MIL/uL (ref 3.87–5.11)
RDW: 14.8 % (ref 11.5–15.5)
WBC: 5.4 10*3/uL (ref 4.0–10.5)

## 2015-05-27 LAB — POCT ACTIVATED CLOTTING TIME
ACTIVATED CLOTTING TIME: 183 s
Activated Clotting Time: 234 seconds

## 2015-05-27 LAB — GLUCOSE, CAPILLARY
GLUCOSE-CAPILLARY: 109 mg/dL — AB (ref 65–99)
GLUCOSE-CAPILLARY: 87 mg/dL (ref 65–99)

## 2015-05-27 LAB — PROTIME-INR
INR: 1.03 (ref 0.00–1.49)
PROTHROMBIN TIME: 13.7 s (ref 11.6–15.2)

## 2015-05-27 SURGERY — ABDOMINAL AORTOGRAM
Laterality: Right

## 2015-05-27 MED ORDER — HEPARIN SODIUM (PORCINE) 1000 UNIT/ML IJ SOLN
INTRAMUSCULAR | Status: DC | PRN
  Administered 2015-05-27: 6000 [IU] via INTRAVENOUS

## 2015-05-27 MED ORDER — LIDOCAINE HCL (PF) 1 % IJ SOLN
INTRAMUSCULAR | Status: AC
  Filled 2015-05-27: qty 30

## 2015-05-27 MED ORDER — HEPARIN SODIUM (PORCINE) 1000 UNIT/ML IJ SOLN
INTRAMUSCULAR | Status: AC
  Filled 2015-05-27: qty 1

## 2015-05-27 MED ORDER — IODIXANOL 320 MG/ML IV SOLN
INTRAVENOUS | Status: DC | PRN
  Administered 2015-05-27: 150 mL via INTRAVENOUS

## 2015-05-27 MED ORDER — FENTANYL CITRATE (PF) 100 MCG/2ML IJ SOLN
INTRAMUSCULAR | Status: DC | PRN
  Administered 2015-05-27 (×2): 50 ug via INTRAVENOUS
  Administered 2015-05-27: 25 ug via INTRAVENOUS
  Administered 2015-05-27: 50 ug via INTRAVENOUS

## 2015-05-27 MED ORDER — HEPARIN (PORCINE) IN NACL 2-0.9 UNIT/ML-% IJ SOLN
INTRAMUSCULAR | Status: DC | PRN
  Administered 2015-05-27: 1000 mL

## 2015-05-27 MED ORDER — MORPHINE SULFATE (PF) 2 MG/ML IV SOLN
INTRAVENOUS | Status: AC
  Filled 2015-05-27: qty 1

## 2015-05-27 MED ORDER — MIDAZOLAM HCL 2 MG/2ML IJ SOLN
INTRAMUSCULAR | Status: AC
  Filled 2015-05-27: qty 2

## 2015-05-27 MED ORDER — FENTANYL CITRATE (PF) 100 MCG/2ML IJ SOLN
INTRAMUSCULAR | Status: AC
  Filled 2015-05-27: qty 2

## 2015-05-27 MED ORDER — NITROGLYCERIN 0.2 MG/ML ON CALL CATH LAB
INTRAVENOUS | Status: AC
  Filled 2015-05-27: qty 1

## 2015-05-27 MED ORDER — ASPIRIN 81 MG PO CHEW: 81.0000 mg | CHEWABLE_TABLET | ORAL | Status: DC

## 2015-05-27 MED ORDER — SODIUM CHLORIDE 0.9% FLUSH: 3.0000 mL | INTRAVENOUS | Status: DC | PRN

## 2015-05-27 MED ORDER — SODIUM CHLORIDE 0.9% FLUSH: 3.0000 mL | Freq: Two times a day (BID) | INTRAVENOUS | Status: DC

## 2015-05-27 MED ORDER — MIDAZOLAM HCL 2 MG/2ML IJ SOLN
INTRAMUSCULAR | Status: DC | PRN
  Administered 2015-05-27: 1 mg via INTRAVENOUS

## 2015-05-27 MED ORDER — SODIUM CHLORIDE 0.9 % IV SOLN
INTRAVENOUS | Status: AC
  Administered 2015-05-27: 14:00:00 via INTRAVENOUS

## 2015-05-27 MED ORDER — SODIUM CHLORIDE 0.9 % WEIGHT BASED INFUSION
3.0000 mL/kg/h | INTRAVENOUS | Status: DC
  Administered 2015-05-27: 3 mL/kg/h via INTRAVENOUS

## 2015-05-27 MED ORDER — SODIUM CHLORIDE 0.9 % IV SOLN: 250.0000 mL | INTRAVENOUS | Status: DC | PRN

## 2015-05-27 MED ORDER — MORPHINE SULFATE (PF) 2 MG/ML IV SOLN
2.0000 mg | INTRAVENOUS | Status: DC | PRN
  Administered 2015-05-27: 2 mg via INTRAVENOUS

## 2015-05-27 MED ORDER — SODIUM CHLORIDE 0.9 % WEIGHT BASED INFUSION: 1.0000 mL/kg/h | INTRAVENOUS | Status: DC

## 2015-05-27 MED ORDER — LIDOCAINE HCL (PF) 1 % IJ SOLN
INTRAMUSCULAR | Status: DC | PRN
  Administered 2015-05-27: 15 mL

## 2015-05-27 MED ORDER — HEPARIN (PORCINE) IN NACL 2-0.9 UNIT/ML-% IJ SOLN
INTRAMUSCULAR | Status: AC
  Filled 2015-05-27: qty 1000

## 2015-05-27 SURGICAL SUPPLY — 20 items
BALLN MUSTANG 5.0X20 75 (BALLOONS) ×4
BALLOON MUSTANG 5.0X20 75 (BALLOONS) ×3 IMPLANT
CATH ANGIO 5F PIGTAIL 65CM (CATHETERS) ×4 IMPLANT
CATH CROSS OVER TEMPO 5F (CATHETERS) ×4 IMPLANT
CATH STRAIGHT 5FR 65CM (CATHETERS) ×4 IMPLANT
DEVICE CONTINUOUS FLUSH (MISCELLANEOUS) ×4 IMPLANT
KIT ENCORE 26 ADVANTAGE (KITS) ×4 IMPLANT
KIT MICROINTRODUCER STIFF 5F (SHEATH) ×4 IMPLANT
KIT PV (KITS) ×4 IMPLANT
SHEATH PINNACLE 5F 10CM (SHEATH) ×4 IMPLANT
SHEATH PINNACLE ST 6F 45CM (SHEATH) ×4 IMPLANT
STENT INNOVA 6X20X130 (Permanent Stent) ×4 IMPLANT
STOPCOCK MORSE 400PSI 3WAY (MISCELLANEOUS) ×4 IMPLANT
SYRINGE MEDRAD AVANTA MACH 7 (SYRINGE) ×4 IMPLANT
TAPE RADIOPAQUE TURBO (MISCELLANEOUS) ×4 IMPLANT
TRANSDUCER W/STOPCOCK (MISCELLANEOUS) ×4 IMPLANT
TRAY PV CATH (CUSTOM PROCEDURE TRAY) ×4 IMPLANT
TUBING CIL FLEX 10 FLL-RA (TUBING) ×4 IMPLANT
WIRE HITORQ VERSACORE ST 145CM (WIRE) ×4 IMPLANT
WIRE VERSACORE LOC 115CM (WIRE) ×4 IMPLANT

## 2015-05-27 NOTE — Progress Notes (Signed)
Took over groin hold from Guardian Life InsuranceDebbie Young RN and continued direct pressure for a total of 20 minutes resulting in complete hemostasis.  Distal pulses present.  Sterile DSG applied to site and pt teaching completed.

## 2015-05-27 NOTE — Interval H&P Note (Signed)
History and Physical Interval Note:  05/27/2015 9:44 AM  [name]  has presented today for surgery, with the diagnosis of pad  The various methods of treatment have been discussed with the patient and family. After consideration of risks, benefits and other options for treatment, the patient has consented to  Procedure(s): Abdominal Aortogram w/Lower Extremity (N/A) as a surgical intervention .  The patient's history has been reviewed, patient examined, no change in status, stable for surgery.  I have reviewed the patient's chart and labs.  Questions were answered to the patient's satisfaction.     Lorine BearsMuhammad Saivion Goettel

## 2015-05-27 NOTE — H&P (View-Only) (Signed)
Cardiology Office Note   Date:  05/06/2015   ID:  [name], DOB 11-08-1955, MRN [id number]  PCP:  Leo Grosser, MD  Cardiologist:  Dr. Shirlee Latch  Chief Complaint  Patient presents with  . Bilateral leg pain    constant pain and swelling      History of Present Illness: [name] is a 60 y.o. female who presents for a follow up visit regarding PAD.  She has known history of peripheral arterial disease status post atherectomy of the right SFA in 2011 by Dr. Sanjuana Kava. She has other chronic medical conditions including CAD, DM, HTN, and hyperlipidemia.   She is a former smoker.  Angiography in 12/2011 showed moderate diffuse disease in R SFA (at most 50%). Three vessel run below the knee but with sluggish flow distally. No revascularization was performed. Cilostazol did not help.  She is s/p bilateral TKR.   Most recent noninvasive vascular evaluation in 12/2014 showed an ABI of 0.9 on the right and 1.1 on the left. Duplex showed mild RSFA disease with a peak velocity of 180.  She reports significant worsening of right calf claudication which is currently happening with minimal activities even when she is just trying to vacuum inside her house. This has significantly affected her ability to perform activities of daily living. She has no rest pain and no lower extremity ulceration.    Past Medical History  Diagnosis Date  . Dyslipidemia   . Tobacco use disorder     quit 11/10  . Osteoarthritis     severe right knee  R TKR  . Asthma with COPD (HCC)   . Pancreatitis 2003  . Diabetes mellitus with circulatory complication (HCC)   . Complication of anesthesia     slow to wake up with last surgery in 2011   . Hypertension   . PAD (peripheral artery disease) (HCC)     a. Severe stenosis mid right SFA s/p atherectomy 03/25/09. b. peripheral angiography in 12/2011 which showed only about 50% diffuse right SFA stenosis.  Marland Kitchen Headache     hx of migraines   . CAD (coronary  artery disease)     a. LHC 2011: Diffuse distal and branch vessel disease - patient managed medically, no interventional options. b. Nuc 03/2014 - low risk, no ischemia.  . Renal artery stenosis (HCC)     a. LHC 2011 - 40-50% left RAS.    Past Surgical History  Procedure Laterality Date  . Total knee arthroplasty Right 2011  . Tubal ligation  1980  . Abdominal aortagram N/A 12/21/2011    Procedure: ABDOMINAL Ronny Flurry;  Surgeon: Iran Ouch, MD;  Location: [facility] CATH LAB;  Service: Cardiovascular;  Laterality: N/A;  . Cardiac catheterization    . Total knee arthroplasty Left 05/22/2014    Procedure: LEFT TOTAL KNEE ARTHROPLASTY;  Surgeon: Ranee Gosselin, MD;  Location: [facility] ORS;  Service: Orthopedics;  Laterality: Left;     Current Outpatient Prescriptions  Medication Sig Dispense Refill  . albuterol (PROVENTIL HFA;VENTOLIN HFA) 108 (90 BASE) MCG/ACT inhaler Inhale 2 puffs into the lungs 2 (two) times daily as needed for wheezing or shortness of breath. 3 Inhaler 5  . aspirin 81 MG tablet Take 81 mg by mouth daily.      . bisoprolol-hydrochlorothiazide (ZIAC) 2.5-6.25 MG tablet TAKE 1 TABLET BY MOUTH EVERY DAY 90 tablet 0  . clopidogrel (PLAVIX) 75 MG tablet TAKE 1 TABLET BY MOUTH EVERY DAY 30 tablet 11  . diphenhydrAMINE (BENADRYL) 25  mg capsule Take 25 mg by mouth every 6 (six) hours as needed for allergies.    . fish oil-omega-3 fatty acids 1000 MG capsule Take 2 capsules (2 g total) by mouth 2 (two) times daily. 360 capsule 3  . Fluticasone-Salmeterol (ADVAIR DISKUS) 250-50 MCG/DOSE AEPB Inhale 1 puff into the lungs every 12 (twelve) hours. 180 each 5  . glimepiride (AMARYL) 4 MG tablet TAKE 1 TABLET BY MOUTH EVERY DAY WITH LUNCH 90 tablet 3  . glucose blood test strip Check bs qam and prn (Pt has one touch berio meter) 100 each 5  . HYDROcodone-acetaminophen (NORCO/VICODIN) 5-325 MG tablet Take 1 tablet by mouth 4 (four) times daily. 120 tablet 0  . isosorbide mononitrate (IMDUR) 60 MG  24 hr tablet TAKE 1 TABLET BY MOUTH DAILY 90 tablet 0  . isosorbide mononitrate (IMDUR) 60 MG 24 hr tablet TAKE 1 TABLET BY MOUTH DAILY 90 tablet 0  . lisinopril (PRINIVIL,ZESTRIL) 10 MG tablet Take 1 tablet (10 mg total) by mouth daily. 90 tablet 4  . metFORMIN (GLUCOPHAGE) 1000 MG tablet TAKE 1 TABLET BY MOUTH TWICE DAILY WITH MEALS 180 tablet 0  . methocarbamol (ROBAXIN) 500 MG tablet TAKE 1 TABLET BY MOUTH EVERY 6 HOURS AS NEEDED FOR MUSCLE SPASMS 120 tablet 0  . Multiple Vitamin (MULTIVITAMIN) tablet Take 1 tablet by mouth daily.      . ondansetron (ZOFRAN) 4 MG tablet TAKE 1 TABLET(4 MG) BY MOUTH EVERY 8 HOURS AS NEEDED FOR NAUSEA OR VOMITING 30 tablet 0  . pantoprazole (PROTONIX) 40 MG tablet Take 1 tablet (40 mg total) by mouth daily. 30 tablet 11  . pioglitazone (ACTOS) 30 MG tablet TAKE 1 TABLET(30 MG) BY MOUTH DAILY 30 tablet 3  . rosuvastatin (CRESTOR) 40 MG tablet Take 1 tablet (40 mg total) by mouth daily. 30 tablet 5   No current facility-administered medications for this visit.    Allergies:   Glucotrol; Influenza vaccines; Latex; Milk-related compounds; Tetanus toxoid; Gabapentin; Invokana; and Nortriptyline    Social History:  The patient  reports that she quit smoking about 6 years ago. She has never used smokeless tobacco. She reports that she does not drink alcohol or use illicit drugs.   Family History:  The patient's family history includes CVA in her mother; Emphysema in her father.    ROS:  Please see the history of present illness.   Otherwise, review of systems are positive for .   All other systems are reviewed and negative.    PHYSICAL EXAM: VS:  BP 128/60 mmHg  Pulse 72  Ht 5\' 3"  (1.6 m)  Wt 194 lb 8 oz (88.225 kg)  BMI 34.46 kg/m2 , BMI Body mass index is 34.46 kg/(m^2). GEN: Well nourished, well developed, in no acute distress HEENT: normal Neck: no JVD, carotid bruits, or masses Cardiac: RRR; no murmurs, rubs, or gallops,no edema  Respiratory:   clear to auscultation bilaterally, normal work of breathing GI: soft, nontender, nondistended, + BS MS: no deformity or atrophy Skin: warm and dry, no rash Neuro:  Strength and sensation are intact Psych: euthymic mood, full affect Vascular :  Femoral pulse is mildly diminished bilaterally.  EKG:  EKG is not ordered today.    Recent Labs: 07/17/2014: Hemoglobin 12.1; Platelets 321.0 01/16/2015: ALT 16; BUN 25; Creat 1.02; Potassium 4.5; Sodium 140    Lipid Panel    Component Value Date/Time   CHOL 148 01/20/2015 0844   TRIG 163* 01/20/2015 0844   HDL 51 01/20/2015  0844   CHOLHDL 2.9 01/20/2015 0844   VLDL 33* 01/20/2015 0844   LDLCALC 64 01/20/2015 0844   LDLDIRECT 50.6 01/02/2014 1003      Wt Readings from Last 3 Encounters:  05/05/15 194 lb 8 oz (88.225 kg)  03/19/15 193 lb 1.9 oz (87.599 kg)  01/16/15 192 lb (87.091 kg)       ASSESSMENT AND PLAN:  1.  PAD: Severe right calf claudication which is currently lifestyle limiting. She reports that her symptoms are much worse than when she was seen in 2013. Although her ABI was only mildly reduced, this might be underestimating the severity of disease. I discussed with her different management options including attempting a more intense walking program versus proceeding with angiography and possible endovascular intervention. She feels that her symptoms are currently very limited to the point of not being able to function. Some of her discomfort seemed to be neuropathic but she clearly has a claudication component. I discussed angiography with her including risks and benefits and she wants to proceed. She is already on dual antiplatelet therapy.  2. Coronary artery disease involving native coronary arteries without angina:  She is stable from a cardiac standpoint.  3. Hyperlipidemia:  Continue treatment with high-dose rosuvastatin. Most recent LDL was 64.    Disposition:   FU with me in 1 month  Signed,  Lorine BearsMuhammad Malessa Zartman,  MD  05/06/2015 2:11 PM    [facility]

## 2015-05-27 NOTE — Discharge Instructions (Signed)
Resume Metformin after 2 days. Saturday morning Our office will call you with follow up.  Angiogram, Care After Refer to this sheet in the next few weeks. These instructions provide you with information about caring for yourself after your procedure. Your health care provider may also give you more specific instructions. Your treatment has been planned according to current medical practices, but problems sometimes occur. Call your health care provider if you have any problems or questions after your procedure. WHAT TO EXPECT AFTER THE PROCEDURE After your procedure, it is typical to have the following:  Bruising at the catheter insertion site that usually fades within 1-2 weeks.  Blood collecting in the tissue (hematoma) that may be painful to the touch. It should usually decrease in size and tenderness within 1-2 weeks. HOME CARE INSTRUCTIONS  Take medicines only as directed by your health care provider.  You may shower 24-48 hours after the procedure or as directed by your health care provider. Remove the bandage (dressing) and gently wash the site with plain soap and water. Pat the area dry with a clean towel. Do not rub the site, because this may cause bleeding.  Do not take baths, swim, or use a hot tub until your health care provider approves.  Check your insertion site every day for redness, swelling, or drainage.  Do not apply powder or lotion to the site.  Do not lift over 10 lb (4.5 kg) for 5 days after your procedure or as directed by your health care provider.  Ask your health care provider when it is okay to:  Return to work or school.  Resume usual physical activities or sports.  Resume sexual activity.  Do not drive home if you are discharged the same day as the procedure. Have someone else drive you.  You may drive 24 hours after the procedure unless otherwise instructed by your health care provider.  Do not operate machinery or power tools for 24 hours after the  procedure or as directed by your health care provider.  If your procedure was done as an outpatient procedure, which means that you went home the same day as your procedure, a responsible adult should be with you for the first 24 hours after you arrive home.  Keep all follow-up visits as directed by your health care provider. This is important. SEEK MEDICAL CARE IF:  You have a fever.  You have chills.  You have increased bleeding from the catheter insertion site. Hold pressure on the site. SEEK IMMEDIATE MEDICAL CARE IF:  You have unusual pain at the catheter insertion site.  You have redness, warmth, or swelling at the catheter insertion site.  You have drainage (other than a small amount of blood on the dressing) from the catheter insertion site.  The catheter insertion site is bleeding, and the bleeding does not stop after 30 minutes of holding steady pressure on the site.  The area near or just beyond the catheter insertion site becomes pale, cool, tingly, or numb.   This information is not intended to replace advice given to you by your health care provider. Make sure you discuss any questions you have with your health care provider.   Document Released: 08/19/2004 Document Revised: 02/21/2014 Document Reviewed: 07/04/2012 Elsevier Interactive Patient Education [employer]2016 [employer]

## 2015-06-08 ENCOUNTER — Telehealth: Payer: Self-pay | Admitting: Family Medicine

## 2015-06-08 NOTE — Telephone Encounter (Signed)
Patient needs rx for her hydrocodone  [phone]

## 2015-06-08 NOTE — Telephone Encounter (Signed)
Ok to refill 

## 2015-06-08 NOTE — Telephone Encounter (Signed)
ok 

## 2015-06-09 MED ORDER — HYDROCODONE-ACETAMINOPHEN 5-325 MG PO TABS: 1.0000 | ORAL_TABLET | Freq: Four times a day (QID) | ORAL | Status: DC

## 2015-06-09 NOTE — Telephone Encounter (Signed)
RX printed, left up front and patient's husband aware to pick up after 2 pm  

## 2015-06-16 ENCOUNTER — Encounter (HOSPITAL_COMMUNITY): Payer: BLUE CROSS/BLUE SHIELD

## 2015-06-16 ENCOUNTER — Other Ambulatory Visit: Payer: Self-pay | Admitting: Family Medicine

## 2015-06-16 ENCOUNTER — Other Ambulatory Visit: Payer: Self-pay | Admitting: Cardiovascular Disease

## 2015-06-16 DIAGNOSIS — I739 Peripheral vascular disease, unspecified: Secondary | ICD-10-CM

## 2015-06-17 NOTE — Telephone Encounter (Signed)
Ok to refill 

## 2015-06-18 ENCOUNTER — Other Ambulatory Visit: Payer: Self-pay | Admitting: Family Medicine

## 2015-06-18 NOTE — Telephone Encounter (Signed)
ok 

## 2015-06-18 NOTE — Telephone Encounter (Signed)
Prescription sent to pharmacy.

## 2015-06-23 ENCOUNTER — Encounter: Payer: Self-pay | Admitting: Cardiovascular Disease

## 2015-06-23 ENCOUNTER — Ambulatory Visit (HOSPITAL_COMMUNITY)
Admission: RE | Admit: 2015-06-23 | Discharge: 2015-06-23 | Disposition: A | Discharge status: Home / Self Care | Payer: BLUE CROSS/BLUE SHIELD | Source: Ambulatory Visit | Attending: Cardiovascular Disease | Admitting: Cardiovascular Disease

## 2015-06-23 ENCOUNTER — Ambulatory Visit (INDEPENDENT_AMBULATORY_CARE_PROVIDER_SITE_OTHER): Payer: BLUE CROSS/BLUE SHIELD | Admitting: Cardiovascular Disease

## 2015-06-23 VITALS — BP 113/64 | HR 76 | Ht 62.0 in | Wt 193.6 lb

## 2015-06-23 DIAGNOSIS — E785 Hyperlipidemia, unspecified: Secondary | ICD-10-CM | POA: Diagnosis not present

## 2015-06-23 DIAGNOSIS — I10 Essential (primary) hypertension: Secondary | ICD-10-CM | POA: Diagnosis not present

## 2015-06-23 DIAGNOSIS — E119 Type 2 diabetes mellitus without complications: Secondary | ICD-10-CM | POA: Diagnosis not present

## 2015-06-23 DIAGNOSIS — I739 Peripheral vascular disease, unspecified: Secondary | ICD-10-CM | POA: Diagnosis not present

## 2015-06-23 DIAGNOSIS — Z9889 Other specified postprocedural states: Secondary | ICD-10-CM | POA: Diagnosis present

## 2015-06-23 DIAGNOSIS — I70201 Unspecified atherosclerosis of native arteries of extremities, right leg: Secondary | ICD-10-CM | POA: Insufficient documentation

## 2015-06-23 DIAGNOSIS — Z720 Tobacco use: Secondary | ICD-10-CM | POA: Insufficient documentation

## 2015-06-23 NOTE — Patient Instructions (Signed)
Medication Instructions:  Your physician recommends that you continue on your current medications as directed. Please refer to the Current Medication list given to you today.  Labwork: No new orders.   Testing/Procedures: Your physician has requested that you have a lower extremity arterial doppler in 6 MONTHS. This test is an ultrasound of the arteries in the legs. It looks at arterial blood flow in the legs. Allow one hour for Lower Arterial scans. There are no restrictions or special instructions  Follow-Up: Your physician wants you to follow-up in: 6 MONTHS with Dr Kirke CorinArida.  You will receive a reminder letter in the mail two months in advance. If you don't receive a letter, please call our office to schedule the follow-up appointment.   Any Other Special Instructions Will Be Listed Below (If Applicable).     If you need a refill on your cardiac medications before your next appointment, please call your pharmacy.

## 2015-06-23 NOTE — Progress Notes (Signed)
Cardiology Office Note   Date:  06/23/2015   ID:  [name], DOB 07-17-1955, MRN [id number]  PCP:  Leslie Fraction, MD  Cardiologist:  Dr. Aundra [name]  Chief Complaint  Patient presents with  . Follow-up    SOB;none. CHEST PAIN;none. LIGHTHEADED/DIZZINESS;none. PAIN OR CRAMPING IN LEGS;pain in legs and left hip. EDEMA; leg swell at night.      History of Present Illness: [name] is a 60 y.o. female who presents for a follow up visit regarding PAD.  She has known history of peripheral arterial disease status post atherectomy of the right SFA in 2011 by Dr. Julianne [name]. She has other chronic medical conditions including CAD, DM, HTN, and hyperlipidemia. She is a former smoker.  Angiography in 12/2011 showed moderate diffuse disease in R SFA (at most 50%). Three vessel run below the knee but with sluggish flow distally. No revascularization was performed. Cilostazol did not help.  She is s/p bilateral TKR.   She was seen recently for worsening right calf claudication. I proceeded with angiography which showed no significant aortoiliac disease. There was moderate right common femoral artery stenosis and severe discrete stenosis in the proximal right SFA with three-vessel runoff below the knee. There was no significant obstructive disease involving the left lower extremity. I place a self-expanding stent to the proximal right SFA. She reports some improvement in her symptoms but she is not complaining of left hip pain which has affected her walking ability.    Past Medical History  Diagnosis Date  . Dyslipidemia   . Tobacco use disorder     quit 11/10  . Osteoarthritis     severe right knee  R TKR  . Asthma with COPD ([place])   . Pancreatitis 2003  . Diabetes mellitus with circulatory complication ([place])   . Complication of anesthesia     slow to wake up with last surgery in 2011   . Hypertension   . PAD (peripheral artery disease) ([place])     a. Severe stenosis mid right SFA  s/p atherectomy 03/25/09. b. peripheral angiography in 12/2011 which showed only about 50% diffuse right SFA stenosis.  Marland Kitchen Headache     hx of migraines   . CAD (coronary artery disease)     a. [place] 2011: Diffuse distal and branch vessel disease - patient managed medically, no interventional options. b. Nuc 03/2014 - low risk, no ischemia.  . Renal artery stenosis ([place])     a. [place] 2011 - 40-50% left RAS.    Past Surgical History  Procedure Laterality Date  . Total knee arthroplasty Right 2011  . Tubal ligation  1980  . Abdominal aortagram N/A 12/21/2011    Procedure: ABDOMINAL [name];  Surgeon: Leslie Hampshire, MD;  Location: [facility] CATH LAB;  Service: Cardiovascular;  Laterality: N/A;  . Cardiac catheterization    . Total knee arthroplasty Left 05/22/2014    Procedure: LEFT TOTAL KNEE ARTHROPLASTY;  Surgeon: Leslie Maudlin, MD;  Location: [facility] ORS;  Service: Orthopedics;  Laterality: Left;  . Peripheral vascular catheterization N/A 05/27/2015    Procedure: Abdominal Aortogram;  Surgeon: Leslie Hampshire, MD;  Location: [place] CV LAB;  Service: Cardiovascular;  Laterality: N/A;  . Lower extremity angiogram Bilateral 05/27/2015    Procedure: Lower Extremity Angiogram;  Surgeon: Leslie Hampshire, MD;  Location: [place] CV LAB;  Service: Cardiovascular;  Laterality: Bilateral;  . Peripheral vascular catheterization Right 05/27/2015    Procedure: Peripheral Vascular Intervention;  Surgeon: Leslie Hampshire, MD;  Location: [place] CV LAB;  Service: Cardiovascular;  Laterality: Right;  SFA     Current Outpatient Prescriptions  Medication Sig Dispense Refill  . albuterol (PROVENTIL HFA;VENTOLIN HFA) 108 (90 BASE) MCG/ACT inhaler Inhale 2 puffs into the lungs 2 (two) times daily as needed for wheezing or shortness of breath. 3 Inhaler 5  . aspirin 81 MG tablet Take 81 mg by mouth daily.      . Biotin w/ Vitamins C & E (HAIR/SKIN/NAILS PO) Take 1 tablet by mouth daily.    .  bisoprolol-hydrochlorothiazide (ZIAC) 2.5-6.25 MG tablet TAKE 1 TABLET BY MOUTH EVERY DAY 90 tablet 3  . Blood Glucose Monitoring Suppl (BAYER CONTOUR MONITOR) w/Device KIT Checks BS QD - E11.9 / Needs strips #100/11 refills / Lancets #100/11 refills (Patient taking differently: 1 each by Other route See admin instructions. Check blood sugar once daily) 1 kit 0  . clopidogrel (PLAVIX) 75 MG tablet TAKE 1 TABLET BY MOUTH EVERY DAY (Patient taking differently: TAKE 75 MG BY MOUTH EVERY DAY) 30 tablet 11  . diphenhydrAMINE (BENADRYL) 25 mg capsule Take 25 mg by mouth every 6 (six) hours as needed for allergies.    . fish oil-omega-3 fatty acids 1000 MG capsule Take 2 capsules (2 g total) by mouth 2 (two) times daily. 360 capsule 3  . Fluticasone-Salmeterol (ADVAIR DISKUS) 250-50 MCG/DOSE AEPB Inhale 1 puff into the lungs every 12 (twelve) hours. 180 each 5  . glimepiride (AMARYL) 4 MG tablet TAKE 1 TABLET BY MOUTH EVERY DAY WITH LUNCH (Patient taking differently: TAKE 4 MG BY MOUTH EVERY DAY WITH LUNCH) 90 tablet 3  . glucose blood test strip Check bs qam and prn (Pt has one touch berio meter) (Patient taking differently: 1 each by Other route See admin instructions. Check blood sugar once daily) 100 each 5  . GuaiFENesin (MUCUS RELIEF PO) Take 10 mLs by mouth as needed (for congestion).    Marland Kitchen HYDROcodone-acetaminophen (NORCO/VICODIN) 5-325 MG tablet Take 1 tablet by mouth 4 (four) times daily. 120 tablet 0  . isosorbide mononitrate (IMDUR) 60 MG 24 hr tablet TAKE 1 TABLET BY MOUTH DAILY (Patient taking differently: TAKE 60 MG BY MOUTH DAILY) 90 tablet 0  . lisinopril (PRINIVIL,ZESTRIL) 10 MG tablet Take 1 tablet (10 mg total) by mouth daily. 90 tablet 4  . metFORMIN (GLUCOPHAGE) 1000 MG tablet TAKE 1 TABLET BY MOUTH TWICE DAILY WITH MEALS (Patient taking differently: TAKE 1000 MG BY MOUTH TWICE DAILY WITH MEALS) 180 tablet 0  . methocarbamol (ROBAXIN) 500 MG tablet TAKE 1 TABLET BY MOUTH EVERY 6 HOURS AS  NEEDED FOR MUSCLE SPASMS 120 tablet 0  . Multiple Vitamin (MULTIVITAMIN) tablet Take 1 tablet by mouth daily.      . ondansetron (ZOFRAN) 4 MG tablet TAKE 1 TABLET(4 MG) BY MOUTH EVERY 8 HOURS AS NEEDED FOR NAUSEA OR VOMITING 30 tablet 0  . pantoprazole (PROTONIX) 40 MG tablet Take 1 tablet (40 mg total) by mouth daily. 30 tablet 11  . pioglitazone (ACTOS) 30 MG tablet TAKE 1 TABLET(30 MG) BY MOUTH DAILY 30 tablet 3  . Probiotic Product (TRUBIOTICS PO) Take 1 capsule by mouth daily.    . rosuvastatin (CRESTOR) 40 MG tablet Take 1 tablet (40 mg total) by mouth daily. 30 tablet 5   No current facility-administered medications for this visit.    Allergies:   Gabapentin; Glucotrol; Influenza vaccines; Latex; Milk-related compounds; Nortriptyline; Tetanus toxoid; and Invokana    Social History:  The patient  reports that  she quit smoking about 6 years ago. She has never used smokeless tobacco. She reports that she does not drink alcohol or use illicit drugs.   Family History:  The patient's family history includes CVA in her mother; Emphysema in her father.    ROS:  Please see the history of present illness.   Otherwise, review of systems are positive for .   All other systems are reviewed and negative.    PHYSICAL EXAM: VS:  BP 113/64 mmHg  Pulse 76  Ht 5' 2"  (1.575 m)  Wt 193 lb 9.6 oz (87.816 kg)  BMI 35.40 kg/m2 , BMI Body mass index is 35.4 kg/(m^2). GEN: Well nourished, well developed, in no acute distress HEENT: normal Neck: no JVD, carotid bruits, or masses Cardiac: RRR; no murmurs, rubs, or gallops,no edema  Respiratory:  clear to auscultation bilaterally, normal work of breathing GI: soft, nontender, nondistended, + BS MS: no deformity or atrophy Skin: warm and dry, no rash Neuro:  Strength and sensation are intact Psych: euthymic mood, full affect Vascular :  Femoral pulse is mildly diminished bilaterally. No groin hematoma  EKG:  EKG is not ordered today.    Recent  Labs: 01/16/2015: ALT 16 05/27/2015: BUN 28*; Creatinine, Ser 1.06*; Hemoglobin 12.0; Platelets 289; Potassium 4.9; Sodium 140    Lipid Panel    Component Value Date/Time   CHOL 148 01/20/2015 0844   TRIG 163* 01/20/2015 0844   HDL 51 01/20/2015 0844   CHOLHDL 2.9 01/20/2015 0844   VLDL 33* 01/20/2015 0844   LDLCALC 64 01/20/2015 0844   LDLDIRECT 50.6 01/02/2014 1003      Wt Readings from Last 3 Encounters:  06/23/15 193 lb 9.6 oz (87.816 kg)  05/27/15 185 lb (83.915 kg)  05/05/15 194 lb 8 oz (88.225 kg)       ASSESSMENT AND PLAN:  1.  PAD: Status post recent stent placement to the proximal right SFA. ABI today was 0.93 on the right and 1.0 on the left with patent stent on duplex. The patient's left groin pain cannot be explained by a vascular etiology. I suspect that she has neuropathic pain and possible arthritis. Continue treatment of risk factors and repeat vascular studies in 6 months.  2. Coronary artery disease involving native coronary arteries without angina:  She is stable from a cardiac standpoint.  3. Hyperlipidemia:  Continue treatment with high-dose rosuvastatin. Most recent LDL was 64.    Disposition:   FU with me in 6 months  Signed,  Kathlyn Sacramento, MD  06/23/2015 10:03 AM    [place]

## 2015-07-07 ENCOUNTER — Telehealth: Payer: Self-pay | Admitting: Family Medicine

## 2015-07-07 MED ORDER — GLUCOSE BLOOD VI STRP: ORAL_STRIP | Status: DC

## 2015-07-07 MED ORDER — HYDROCODONE-ACETAMINOPHEN 5-325 MG PO TABS: 1.0000 | ORAL_TABLET | Freq: Four times a day (QID) | ORAL | Status: DC

## 2015-07-07 NOTE — Telephone Encounter (Signed)
Ok to refill 

## 2015-07-07 NOTE — Telephone Encounter (Signed)
RX printed, left up front and patient aware to pick up in the am and strips sent to pharm

## 2015-07-07 NOTE — Telephone Encounter (Signed)
Pt is requesting a refill of Hydrocodone 5-325 and test strips for her Contour Next glucose monitor [facility]

## 2015-07-07 NOTE — Telephone Encounter (Signed)
ok 

## 2015-07-14 ENCOUNTER — Ambulatory Visit: Payer: BLUE CROSS/BLUE SHIELD | Admitting: Cardiovascular Disease

## 2015-07-15 ENCOUNTER — Other Ambulatory Visit: Payer: Self-pay | Admitting: Family Medicine

## 2015-07-15 NOTE — Telephone Encounter (Signed)
Refill appropriate and filled per protocol. 

## 2015-07-16 ENCOUNTER — Other Ambulatory Visit: Payer: Self-pay | Admitting: Family Medicine

## 2015-07-28 ENCOUNTER — Other Ambulatory Visit: Payer: Self-pay | Admitting: Family Medicine

## 2015-07-28 DIAGNOSIS — Z1231 Encounter for screening mammogram for malignant neoplasm of breast: Secondary | ICD-10-CM

## 2015-08-03 ENCOUNTER — Ambulatory Visit (INDEPENDENT_AMBULATORY_CARE_PROVIDER_SITE_OTHER): Payer: BLUE CROSS/BLUE SHIELD | Admitting: Family Medicine

## 2015-08-03 ENCOUNTER — Encounter: Payer: Self-pay | Admitting: Family Medicine

## 2015-08-03 VITALS — BP 110/68 | HR 74 | Temp 97.6°F | Resp 18 | Wt 196.0 lb

## 2015-08-03 DIAGNOSIS — K861 Other chronic pancreatitis: Secondary | ICD-10-CM

## 2015-08-03 DIAGNOSIS — I2510 Atherosclerotic heart disease of native coronary artery without angina pectoris: Secondary | ICD-10-CM

## 2015-08-03 DIAGNOSIS — I10 Essential (primary) hypertension: Secondary | ICD-10-CM

## 2015-08-03 DIAGNOSIS — Z23 Encounter for immunization: Secondary | ICD-10-CM | POA: Diagnosis not present

## 2015-08-03 DIAGNOSIS — E1151 Type 2 diabetes mellitus with diabetic peripheral angiopathy without gangrene: Secondary | ICD-10-CM

## 2015-08-03 DIAGNOSIS — E785 Hyperlipidemia, unspecified: Secondary | ICD-10-CM | POA: Diagnosis not present

## 2015-08-03 LAB — LIPID PANEL
CHOL/HDL RATIO: 3.2 ratio (ref <=5.0)
CHOLESTEROL: 176 mg/dL (ref 125–200)
HDL: 55 mg/dL (ref >=46)
LDL Cholesterol: 73 mg/dL (ref <130)
TRIGLYCERIDES: 240 mg/dL — AB (ref <150)
VLDL: 48 mg/dL — AB (ref <30)

## 2015-08-03 LAB — CBC WITH DIFFERENTIAL/PLATELET
BASOS ABS: 60 {cells}/uL (ref 0–200)
Basophils Relative: 1 %
EOS ABS: 360 {cells}/uL (ref 15–500)
Eosinophils Relative: 6 %
HEMATOCRIT: 38 % (ref 35.0–45.0)
HEMOGLOBIN: 12.1 g/dL (ref 12.0–15.0)
LYMPHS ABS: 2340 {cells}/uL (ref 850–3900)
LYMPHS PCT: 39 %
MCH: 28.3 pg (ref 27.0–33.0)
MCHC: 31.8 g/dL — AB (ref 32.0–36.0)
MCV: 89 fL (ref 80.0–100.0)
MONO ABS: 420 {cells}/uL (ref 200–950)
MPV: 8.5 fL (ref 7.5–12.5)
Monocytes Relative: 7 %
NEUTROS PCT: 47 %
Neutro Abs: 2820 cells/uL (ref 1500–7800)
Platelets: 264 10*3/uL (ref 140–400)
RBC: 4.27 MIL/uL (ref 3.80–5.10)
RDW: 14.1 % (ref 11.0–15.0)
WBC: 6 10*3/uL (ref 3.8–10.8)

## 2015-08-03 LAB — COMPLETE METABOLIC PANEL WITH GFR
ALBUMIN: 4.4 g/dL (ref 3.6–5.1)
ALK PHOS: 67 U/L (ref 33–130)
ALT: 18 U/L (ref 6–29)
AST: 18 U/L (ref 10–35)
BUN: 23 mg/dL (ref 7–25)
CALCIUM: 9.8 mg/dL (ref 8.6–10.4)
CHLORIDE: 106 mmol/L (ref 98–110)
CO2: 25 mmol/L (ref 20–31)
CREATININE: 1.02 mg/dL (ref 0.50–1.05)
GFR, EST AFRICAN AMERICAN: 70 mL/min (ref >=60)
GFR, Est Non African American: 61 mL/min (ref >=60)
Glucose, Bld: 59 mg/dL — ABNORMAL LOW (ref 70–99)
POTASSIUM: 5.3 mmol/L (ref 3.5–5.3)
Sodium: 141 mmol/L (ref 135–146)
Total Bilirubin: 0.2 mg/dL (ref 0.2–1.2)
Total Protein: 6.7 g/dL (ref 6.1–8.1)

## 2015-08-03 LAB — HEMOGLOBIN A1C
Hgb A1c MFr Bld: 6.3 % — ABNORMAL HIGH (ref <5.7)
MEAN PLASMA GLUCOSE: 134 mg/dL

## 2015-08-03 MED ORDER — HYDROCODONE-ACETAMINOPHEN 5-325 MG PO TABS: 1.0000 | ORAL_TABLET | Freq: Four times a day (QID) | ORAL | Status: DC

## 2015-08-03 NOTE — Progress Notes (Signed)
Subjective:    Patient ID: [name], female    DOB: 01-01-1956, 60 y.o.   MRN: [id number]  HPI  2/16 Patient is here today to establish care. She has a very complicated past medical history including coronary artery disease and peripheral artery disease. She is currently managed medically. Patient specifically states that her arteries are not amenable to percutaneous coronary intervention. She is on aspirin and Plavix, Imdur, a statin, beta blocker, and ACE inhibitor. She recently had a stress test to clear her for her upcoming surgery and per the patient report her stress test was negative. She also has a chronic history of asthma that she has had since childhood. This is complicated by long-standing tobacco abuse. She states that she quit smoking several years ago. Her history states that she has COPD but she is adamant that it is asthma. She is currently managed with Advair twice a day and albuterol as needed. She uses albuterol 1-2 times per week. She also has long-standing history of episodes of pancreatitis. Patient states that she quit drinking approximately 20 years ago. She states that she was not a heavy drinker but that she did indulge in alcohol at that time. That is when the pancreatitis began. Ever since she has episodes of pancreatitis. The patient is not sure what triggers her pancreatitis. Patient was scheduled for a HIDA scan but that got canceled for some reason. At the present time she treats episodes of pancreatitis with bowel rest and pain medication. She also had a right knee replacement. She has chronic pain in both knees. Her orthopedist is scheduling her for surgery to evaluate her right knee replacement and also perform a left total knee replacement. She has never had a colonoscopy. She refuses a flu shot. She had Pneumovax 23 in 2011. She is due for Prevnar 13. She had a mammogram is up-to-date. Her Pap smear was performed last year. She states that she also has pain in her  right leg due to nerve damage.  AT that time, my plan was: Patient certainly has diabetes. I would like to check a CMP, fasting lipid panel, hemoglobin A1c, and urine microalbumin to evaluate the control of her diabetes. She is already on an aspirin due to her coronary artery disease. Given her asthma and history of smoking I will evaluate to see if the patient has COPD by performing pulmonary function test. She may benefit from a long-acting anticholinergic such as Spiriva if there is an element of COPD. I would like to check a fasting lipid panel because her goal LDL cholesterol is less than 70. Per her orthopedist's request, I will check a sedimentation rate as well as a CRP to evaluate the pain in both knees. I will continue to prescribe her pain medication at the current dose. She also has a goiter which is a coincidental finding on examination. I will schedule the patient for an ultrasound of her neck to evaluate further. I recommended a pneumonia vaccine, Prevnar 63, but she refused today. She also refused referral for colonoscopy as well as a flu shot. The remainder of her preventative care is up-to-date. Her blood pressure is controlled..  Pulmonary function tests show an FEV1 to FVC ratio 72% which is consistent with mild obstruction, , FEV1 is 1.82 L which is 75% of predicted. 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 Also mentioned increasing her pain medication for her  knee. I explained to the patient that I would be willing to continue the current dose that she is taking however she wants to increase the amount of narcotics she is taking due to the volume of narcotics that she's taking I would recommend a pain clinic referral. She states that she will discuss that with me further after the issues with her knee or dealt with.  01/16/15  patient is here today at my request for follow-up. She is overdue for a diabetes  check. She reports hypoglycemic episodes less than once a week. She states that her blood sugars are typically very well controlled under 100 in the morning. She denies any hypoglycemic episodes. She denies any chest pain or shortness of breath or dyspnea on exertion. Given her complicated history and her history of COPD, I have recommended a flu shot which she declines. Also recommended a colonoscopy which she declines. She is overdue for fasting lipid panel. She denies any myalgias or right upper quadrant pain on  Crestor 40 mg by mouth daily.  AT that time, my plan was: patient refuses a colonoscopy. She refuses a flu shot. I will check a hemoglobin A1c today. Goal hemoglobin A1c is less than 6.5. I recommended discontinuation of glimepiride if she is having hypoglycemic episodes. However she is very hesitant to switch medications because she is on limited income and therefore she elects to continue the Amaryl. I asked her to come back fasting so that I can check a fasting lipid panel. I will also check a urine microalbumin and if elevated I will increase her lisinopril. Blood pressure is acceptable today 08/03/15 Here today for follow-up. Since I last saw the patient, she had a stent placed in the artery in her right leg for peripheral artery disease. She continues to have claudication in the right leg but it is much improved. She denies any neuropathy or numbness in her feet. She denies any chest pain or shortness of breath or dyspnea on exertion. She denies any myalgias or right upper quadrant pain. Her blood pressure today is well controlled at 110/68. She denies any blurry vision or episodes of hypoglycemia. She declines HIV test. She declines hepatitis C screening. She refuses colonoscopy. She will consent Prevnar 13. I recommended a diabetic eye exam annually Past Medical History  Diagnosis Date  . Dyslipidemia   . Tobacco use disorder     quit 11/10  . Osteoarthritis     severe right knee  R TKR    . Asthma with COPD ([place])   . Pancreatitis 2003  . Diabetes mellitus with circulatory complication ([place])   . Complication of anesthesia     slow to wake up with last surgery in 2011   . Hypertension   . PAD (peripheral artery disease) ([place])     a. Severe stenosis mid right SFA s/p atherectomy 03/25/09. b. peripheral angiography in 12/2011 which showed only about 50% diffuse right SFA stenosis.  Marland Kitchen Headache     hx of migraines   . CAD (coronary artery disease)     a. [place] 2011: Diffuse distal and branch vessel disease - patient managed medically, no interventional options. b. Nuc 03/2014 - low risk, no ischemia.  . Renal artery stenosis ([place])     a. [place] 2011 - 40-50% left RAS.   Past Surgical History  Procedure Laterality Date  . Total knee arthroplasty Right 2011  . Tubal ligation  1980  . Abdominal aortagram N/A 12/21/2011    Procedure: ABDOMINAL  Maxcine Ham;  Surgeon: Wellington Hampshire, MD;  Location: [facility] CATH LAB;  Service: Cardiovascular;  Laterality: N/A;  . Cardiac catheterization    . Total knee arthroplasty Left 05/22/2014    Procedure: LEFT TOTAL KNEE ARTHROPLASTY;  Surgeon: Latanya Maudlin, MD;  Location: [facility] ORS;  Service: Orthopedics;  Laterality: Left;  . Peripheral vascular catheterization N/A 05/27/2015    Procedure: Abdominal Aortogram;  Surgeon: Wellington Hampshire, MD;  Location: [place] [facility] CV LAB;  Service: Cardiovascular;  Laterality: N/A;  . Lower extremity angiogram Bilateral 05/27/2015    Procedure: Lower Extremity Angiogram;  Surgeon: Wellington Hampshire, MD;  Location: [place] CV LAB;  Service: Cardiovascular;  Laterality: Bilateral;  . Peripheral vascular catheterization Right 05/27/2015    Procedure: Peripheral Vascular Intervention;  Surgeon: Wellington Hampshire, MD;  Location: [place] CV LAB;  Service: Cardiovascular;  Laterality: Right;  SFA   Current Outpatient Prescriptions on File Prior to Visit  Medication Sig Dispense Refill  . albuterol (PROVENTIL HFA;VENTOLIN  HFA) 108 (90 BASE) MCG/ACT inhaler Inhale 2 puffs into the lungs 2 (two) times daily as needed for wheezing or shortness of breath. 3 Inhaler 5  . aspirin 81 MG tablet Take 81 mg by mouth daily.      . Biotin w/ Vitamins C & E (HAIR/SKIN/NAILS PO) Take 1 tablet by mouth daily.    . bisoprolol-hydrochlorothiazide (ZIAC) 2.5-6.25 MG tablet TAKE 1 TABLET BY MOUTH EVERY DAY 90 tablet 3  . Blood Glucose Monitoring Suppl (BAYER CONTOUR MONITOR) w/Device KIT Checks BS QD - E11.9 / Needs strips #100/11 refills / Lancets #100/11 refills (Patient taking differently: 1 each by Other route See admin instructions. Check blood sugar once daily) 1 kit 0  . clopidogrel (PLAVIX) 75 MG tablet TAKE 1 TABLET BY MOUTH EVERY DAY (Patient taking differently: TAKE 75 MG BY MOUTH EVERY DAY) 30 tablet 11  . diphenhydrAMINE (BENADRYL) 25 mg capsule Take 25 mg by mouth every 6 (six) hours as needed for allergies.    . fish oil-omega-3 fatty acids 1000 MG capsule Take 2 capsules (2 g total) by mouth 2 (two) times daily. 360 capsule 3  . Fluticasone-Salmeterol (ADVAIR DISKUS) 250-50 MCG/DOSE AEPB Inhale 1 puff into the lungs every 12 (twelve) hours. 180 each 5  . glimepiride (AMARYL) 4 MG tablet TAKE 1 TABLET BY MOUTH EVERY DAY WITH LUNCH (Patient taking differently: TAKE 4 MG BY MOUTH EVERY DAY WITH LUNCH) 90 tablet 3  . glucose blood test strip Checks BS QD - E11.9  - Uses contour next meter 100 each 12  . GuaiFENesin (MUCUS RELIEF PO) Take 10 mLs by mouth as needed (for congestion).    Marland Kitchen HYDROcodone-acetaminophen (NORCO/VICODIN) 5-325 MG tablet Take 1 tablet by mouth 4 (four) times daily. 120 tablet 0  . isosorbide mononitrate (IMDUR) 60 MG 24 hr tablet TAKE 1 TABLET BY MOUTH DAILY (Patient taking differently: TAKE 60 MG BY MOUTH DAILY) 90 tablet 0  . lisinopril (PRINIVIL,ZESTRIL) 10 MG tablet Take 1 tablet (10 mg total) by mouth daily. 90 tablet 4  . metFORMIN (GLUCOPHAGE) 1000 MG tablet TAKE 1 TABLET BY MOUTH TWICE DAILY WITH  MEALS 180 tablet 0  . methocarbamol (ROBAXIN) 500 MG tablet TAKE 1 TABLET BY MOUTH EVERY 6 HOURS AS NEEDED FOR MUSCLE SPASMS 120 tablet 0  . Multiple Vitamin (MULTIVITAMIN) tablet Take 1 tablet by mouth daily.      . ondansetron (ZOFRAN) 4 MG tablet TAKE 1 TABLET(4 MG) BY MOUTH EVERY 8 HOURS AS NEEDED FOR  NAUSEA OR VOMITING 30 tablet 0  . pantoprazole (PROTONIX) 40 MG tablet Take 1 tablet (40 mg total) by mouth daily. 30 tablet 11  . pioglitazone (ACTOS) 30 MG tablet TAKE 1 TABLET(30 MG) BY MOUTH DAILY 30 tablet 3  . Probiotic Product (TRUBIOTICS PO) Take 1 capsule by mouth daily.    . rosuvastatin (CRESTOR) 40 MG tablet TAKE 1 TABLET BY MOUTH EVERY DAY 30 tablet 0   No current facility-administered medications on file prior to visit.   Allergies  Allergen Reactions  . Gabapentin Other (See Comments)    Brain shakes  . Glucotrol [Glipizide] Rash and Other (See Comments)    Red rash  . Influenza Vaccines Other (See Comments)    Significant arm soreness requiring 1 year of physical therapy  . Latex Rash  . Milk-Related Compounds Rash and Other (See Comments)    Fever  . Nortriptyline Other (See Comments)    BRAIN SHAKE  . Tetanus Toxoid Hives, Swelling, Rash and Other (See Comments)    Swelling to site of injection with rash and fever  . Invokana [Canagliflozin] Other (See Comments)    Yeast Infections   Social History   Social History  . Marital Status: Married    Spouse Name: N/A  . Number of Children: N/A  . Years of Education: 10   Occupational History  . Not on file.   Social History Main Topics  . Smoking status: Former Smoker    Quit date: 12/13/2008  . Smokeless tobacco: Never Used  . Alcohol Use: No  . Drug Use: No  . Sexual Activity: Not on file   Other Topics Concern  . Not on file   Social History Narrative   Denies any IV drug use or marijuana use.  Former smoker for 30 years, quit Nov. 2010. She is married with 2 children. Disabled from knee, not  working.   Family History  Problem Relation Age of Onset  . Emphysema Father   . CVA Mother      Review of Systems  All other systems reviewed and are negative.      Objective:   Physical Exam  Constitutional: She is oriented to person, place, and time. She appears well-developed and well-nourished. No distress.  HENT:  Right Ear: External ear normal.  Left Ear: External ear normal.  Nose: Nose normal.  Mouth/Throat: Oropharynx is clear and moist.  Eyes: Conjunctivae are normal.  Neck: Neck supple. No JVD present. Thyromegaly present.  Cardiovascular: Normal rate, regular rhythm and normal heart sounds.   No murmur heard. Pulmonary/Chest: Effort normal and breath sounds normal. She has no wheezes. She has no rales. She exhibits no tenderness.  Abdominal: Soft. Bowel sounds are normal. She exhibits no distension and no mass. There is no tenderness. There is no rebound and no guarding.  Musculoskeletal: She exhibits no edema.  Lymphadenopathy:    She has no cervical adenopathy.  Neurological: She is alert and oriented to person, place, and time. She has normal reflexes. No cranial nerve deficit. She exhibits normal muscle tone. Coordination normal.  Skin: No rash noted. She is not diaphoretic. No erythema.  Vitals reviewed.  Patient has no neurologic deficit in her feet. She does have thick calluses on the lateral aspects of the MTP joints bilaterally. She has diminished pulses in the right foot checked at the dorsalis pedis and posterior tibialis       Assessment & Plan:   ASCVD (arteriosclerotic cardiovascular disease)  DM (diabetes mellitus) type  II controlled peripheral vascular disorder ([place]) - Plan: CBC with Differential/Platelet, COMPLETE METABOLIC PANEL WITH GFR, Lipid panel, Microalbumin, urine, Hemoglobin A1c  Benign essential HTN  HLD (hyperlipidemia)  Idiopathic chronic pancreatitis ([place])  The patient's blood pressure is excellent. I will check a  hemoglobin A1c. Her goal hemoglobin A1c is less than 6.5. I will also check a fasting lipid panel. Her goal LDL cholesterol is less than 70. I recommended an annual diabetic eye exam but she declined. I recommended hepatitis C testing and HIV testing but she declined. I recommended a colonoscopy but she declined.

## 2015-08-03 NOTE — Addendum Note (Signed)
Addended by: Legrand RamsWILLIS, SANDY B on: 08/03/2015 11:17 AM   Modules accepted: Orders

## 2015-08-04 ENCOUNTER — Encounter: Payer: Self-pay | Admitting: Family Medicine

## 2015-08-04 LAB — MICROALBUMIN, URINE: MICROALB UR: 1.7 mg/dL

## 2015-08-10 ENCOUNTER — Ambulatory Visit
Admission: RE | Admit: 2015-08-10 | Discharge: 2015-08-10 | Disposition: A | Discharge status: Home / Self Care | Payer: BLUE CROSS/BLUE SHIELD | Source: Ambulatory Visit | Attending: Family Medicine | Admitting: Family Medicine

## 2015-08-10 DIAGNOSIS — Z1231 Encounter for screening mammogram for malignant neoplasm of breast: Secondary | ICD-10-CM

## 2015-08-12 ENCOUNTER — Other Ambulatory Visit: Payer: Self-pay | Admitting: Family Medicine

## 2015-08-13 NOTE — Telephone Encounter (Signed)
Refill appropriate and filled per protocol. 

## 2015-08-14 ENCOUNTER — Other Ambulatory Visit: Payer: Self-pay | Admitting: Family Medicine

## 2015-08-14 DIAGNOSIS — I10 Essential (primary) hypertension: Secondary | ICD-10-CM

## 2015-08-14 MED ORDER — LISINOPRIL 10 MG PO TABS: 10.0000 mg | ORAL_TABLET | Freq: Every day | ORAL | Status: DC

## 2015-08-14 NOTE — Telephone Encounter (Signed)
Medication called/sent to requested pharmacy  

## 2015-08-20 ENCOUNTER — Other Ambulatory Visit: Payer: Self-pay | Admitting: Family Medicine

## 2015-08-20 NOTE — Telephone Encounter (Signed)
Refill appropriate and filled per protocol. 

## 2015-09-01 ENCOUNTER — Telehealth: Payer: Self-pay | Admitting: Family Medicine

## 2015-09-01 MED ORDER — HYDROCODONE-ACETAMINOPHEN 5-325 MG PO TABS: 1.0000 | ORAL_TABLET | Freq: Four times a day (QID) | ORAL | Status: DC

## 2015-09-01 NOTE — Telephone Encounter (Signed)
Pt is requesting a refill of Hydrocodone 5-325 mg. [phone]

## 2015-09-01 NOTE — Telephone Encounter (Signed)
Ok to refill 

## 2015-09-01 NOTE — Telephone Encounter (Signed)
RX printed, left up front and patient aware to pick up tomorrow 

## 2015-09-01 NOTE — Telephone Encounter (Signed)
ok 

## 2015-09-02 MED ORDER — HYDROCODONE-ACETAMINOPHEN 5-325 MG PO TABS: 1.0000 | ORAL_TABLET | Freq: Four times a day (QID) | ORAL | Status: DC

## 2015-09-02 NOTE — Addendum Note (Signed)
Addended by: Legrand RamsWILLIS, Myan [place] B on: 09/02/2015 09:11 AM   Modules accepted: Orders

## 2015-09-08 ENCOUNTER — Other Ambulatory Visit: Payer: Self-pay | Admitting: Family Medicine

## 2015-09-08 NOTE — Telephone Encounter (Signed)
Refill appropriate and filled per protocol. 

## 2015-10-05 ENCOUNTER — Telehealth: Payer: Self-pay | Admitting: Family Medicine

## 2015-10-05 ENCOUNTER — Other Ambulatory Visit: Payer: Self-pay | Admitting: Family Medicine

## 2015-10-05 NOTE — Telephone Encounter (Signed)
PATIENT CALLING TO GET RX FOR HER HYDROCODONE

## 2015-10-06 MED ORDER — HYDROCODONE-ACETAMINOPHEN 5-325 MG PO TABS: 1.0000 | ORAL_TABLET | Freq: Four times a day (QID) | ORAL | 0 refills | Status: DC

## 2015-10-06 NOTE — Telephone Encounter (Signed)
Ok to refill 

## 2015-10-06 NOTE — Telephone Encounter (Signed)
ok 

## 2015-10-06 NOTE — Telephone Encounter (Signed)
RX printed, left up front and patient aware to pick up after 2 pm  

## 2015-10-10 ENCOUNTER — Other Ambulatory Visit: Payer: Self-pay | Admitting: Family Medicine

## 2015-11-02 ENCOUNTER — Telehealth: Payer: Self-pay | Admitting: Family Medicine

## 2015-11-02 ENCOUNTER — Other Ambulatory Visit: Payer: Self-pay | Admitting: Family Medicine

## 2015-11-02 NOTE — Telephone Encounter (Signed)
Patient calling requesting refill on her hydrocodone 5-325  CB# [phone]

## 2015-11-06 ENCOUNTER — Telehealth: Payer: Self-pay | Admitting: Family Medicine

## 2015-11-06 NOTE — Telephone Encounter (Signed)
ok 

## 2015-11-06 NOTE — Telephone Encounter (Signed)
Patient needs rx for her hydrocodone  ° °

## 2015-11-06 NOTE — Telephone Encounter (Signed)
Ok to refill 

## 2015-11-07 MED ORDER — HYDROCODONE-ACETAMINOPHEN 5-325 MG PO TABS: 1.0000 | ORAL_TABLET | Freq: Four times a day (QID) | ORAL | 0 refills | Status: DC

## 2015-11-07 NOTE — Telephone Encounter (Signed)
RX printed, left up front and patient aware to pick up via vm - also reminded that we need 48-72 hours for refills

## 2015-11-19 ENCOUNTER — Other Ambulatory Visit: Payer: Self-pay | Admitting: Family Medicine

## 2015-11-20 NOTE — Telephone Encounter (Signed)
Rx refilled per protocol 

## 2015-11-24 ENCOUNTER — Encounter: Payer: Self-pay | Admitting: Family Medicine

## 2015-11-24 ENCOUNTER — Ambulatory Visit (INDEPENDENT_AMBULATORY_CARE_PROVIDER_SITE_OTHER): Payer: BLUE CROSS/BLUE SHIELD | Admitting: Family Medicine

## 2015-11-24 ENCOUNTER — Ambulatory Visit: Payer: BLUE CROSS/BLUE SHIELD | Admitting: Family Medicine

## 2015-11-24 VITALS — BP 128/74 | HR 80 | Temp 98.3°F | Resp 18 | Ht 63.0 in | Wt 197.0 lb

## 2015-11-24 DIAGNOSIS — I10 Essential (primary) hypertension: Secondary | ICD-10-CM | POA: Diagnosis not present

## 2015-11-24 DIAGNOSIS — I2510 Atherosclerotic heart disease of native coronary artery without angina pectoris: Secondary | ICD-10-CM

## 2015-11-24 DIAGNOSIS — Z1159 Encounter for screening for other viral diseases: Secondary | ICD-10-CM | POA: Diagnosis not present

## 2015-11-24 DIAGNOSIS — E119 Type 2 diabetes mellitus without complications: Secondary | ICD-10-CM

## 2015-11-24 DIAGNOSIS — E781 Pure hyperglyceridemia: Secondary | ICD-10-CM

## 2015-11-24 LAB — COMPLETE METABOLIC PANEL WITH GFR
ALBUMIN: 4.5 g/dL (ref 3.6–5.1)
ALK PHOS: 77 U/L (ref 33–130)
ALT: 18 U/L (ref 6–29)
AST: 17 U/L (ref 10–35)
BUN: 28 mg/dL — ABNORMAL HIGH (ref 7–25)
CALCIUM: 10.2 mg/dL (ref 8.6–10.4)
CHLORIDE: 103 mmol/L (ref 98–110)
CO2: 25 mmol/L (ref 20–31)
Creat: 1.1 mg/dL — ABNORMAL HIGH (ref 0.50–1.05)
GFR, EST AFRICAN AMERICAN: 64 mL/min (ref >=60)
GFR, Est Non African American: 55 mL/min — ABNORMAL LOW (ref >=60)
Glucose, Bld: 85 mg/dL (ref 70–99)
POTASSIUM: 5.2 mmol/L (ref 3.5–5.3)
Sodium: 140 mmol/L (ref 135–146)
Total Bilirubin: 0.2 mg/dL (ref 0.2–1.2)
Total Protein: 7 g/dL (ref 6.1–8.1)

## 2015-11-24 LAB — CBC WITH DIFFERENTIAL/PLATELET
BASOS ABS: 58 {cells}/uL (ref 0–200)
Basophils Relative: 1 %
Eosinophils Absolute: 290 cells/uL (ref 15–500)
Eosinophils Relative: 5 %
HEMATOCRIT: 40.4 % (ref 35.0–45.0)
Hemoglobin: 12.8 g/dL (ref 12.0–15.0)
LYMPHS ABS: 2204 {cells}/uL (ref 850–3900)
Lymphocytes Relative: 38 %
MCH: 28.3 pg (ref 27.0–33.0)
MCHC: 31.7 g/dL — AB (ref 32.0–36.0)
MCV: 89.2 fL (ref 80.0–100.0)
MONO ABS: 406 {cells}/uL (ref 200–950)
MPV: 8.6 fL (ref 7.5–12.5)
Monocytes Relative: 7 %
NEUTROS PCT: 49 %
Neutro Abs: 2842 cells/uL (ref 1500–7800)
Platelets: 282 10*3/uL (ref 140–400)
RBC: 4.53 MIL/uL (ref 3.80–5.10)
RDW: 14.5 % (ref 11.0–15.0)
WBC: 5.8 10*3/uL (ref 3.8–10.8)

## 2015-11-24 LAB — LIPID PANEL
CHOL/HDL RATIO: 3 ratio (ref <=5.0)
CHOLESTEROL: 167 mg/dL (ref 125–200)
HDL: 56 mg/dL (ref >=46)
LDL Cholesterol: 76 mg/dL (ref <130)
TRIGLYCERIDES: 177 mg/dL — AB (ref <150)
VLDL: 35 mg/dL — AB (ref <30)

## 2015-11-24 NOTE — Progress Notes (Signed)
Subjective:    Patient ID: [name], female    DOB: 12/26/1955, 60 y.o.   MRN: [id number]  HPI 2/16 Patient is here today to establish care. She has a very complicated past medical history including coronary artery disease and peripheral artery disease. She is currently managed medically. Patient specifically states that her arteries are not amenable to percutaneous coronary intervention. She is on aspirin and Plavix, Imdur, a statin, beta blocker, and ACE inhibitor. She recently had a stress test to clear her for her upcoming surgery and per the patient report her stress test was negative. She also has a chronic history of asthma that she has had since childhood. This is complicated by long-standing tobacco abuse. She states that she quit smoking several years ago. Her history states that she has COPD but she is adamant that it is asthma. She is currently managed with Advair twice a day and albuterol as needed. She uses albuterol 1-2 times per week. She also has long-standing history of episodes of pancreatitis. Patient states that she quit drinking approximately 20 years ago. She states that she was not a heavy drinker but that she did indulge in alcohol at that time. That is when the pancreatitis began. Ever since she has episodes of pancreatitis. The patient is not sure what triggers her pancreatitis. Patient was scheduled for a HIDA scan but that got canceled for some reason. At the present time she treats episodes of pancreatitis with bowel rest and pain medication. She also had a right knee replacement. She has chronic pain in both knees. Her orthopedist is scheduling her for surgery to evaluate her right knee replacement and also perform a left total knee replacement. She has never had a colonoscopy. She refuses a flu shot. She had Pneumovax 23 in 2011. She is due for Prevnar 13. She had a mammogram is up-to-date. Her Pap smear was performed last year. She states that she also has pain in her  right leg due to nerve damage.  AT that time, my plan was: Patient certainly has diabetes. I would like to check a CMP, fasting lipid panel, hemoglobin A1c, and urine microalbumin to evaluate the control of her diabetes. She is already on an aspirin due to her coronary artery disease. Given her asthma and history of smoking I will evaluate to see if the patient has COPD by performing pulmonary function test. She may benefit from a long-acting anticholinergic such as Spiriva if there is an element of COPD. I would like to check a fasting lipid panel because her goal LDL cholesterol is less than 70. Per her orthopedist's request, I will check a sedimentation rate as well as a CRP to evaluate the pain in both knees. I will continue to prescribe her pain medication at the current dose. She also has a goiter which is a coincidental finding on examination. I will schedule the patient for an ultrasound of her neck to evaluate further. I recommended a pneumonia vaccine, Prevnar 41, but she refused today. She also refused referral for colonoscopy as well as a flu shot. The remainder of her preventative care is up-to-date. Her blood pressure is controlled..  Pulmonary function tests show an FEV1 to FVC ratio 72% which is consistent with mild obstruction, , FEV1 is 1.82 L which is 75% of predicted. 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 Also mentioned increasing her pain medication for her knee.  I explained to the patient that I would be willing to continue the current dose that she is taking however she wants to increase the amount of narcotics she is taking due to the volume of narcotics that she's taking I would recommend a pain clinic referral. She states that she will discuss that with me further after the issues with her knee or dealt with.  01/16/15  patient is here today at my request for follow-up. She is overdue for a diabetes  check. She reports hypoglycemic episodes less than once a week. She states that her blood sugars are typically very well controlled under 100 in the morning. She denies any hypoglycemic episodes. She denies any chest pain or shortness of breath or dyspnea on exertion. Given her complicated history and her history of COPD, I have recommended a flu shot which she declines. Also recommended a colonoscopy which she declines. She is overdue for fasting lipid panel. She denies any myalgias or right upper quadrant pain on  Crestor 40 mg by mouth daily.  AT that time, my plan was: patient refuses a colonoscopy. She refuses a flu shot. I will check a hemoglobin A1c today. Goal hemoglobin A1c is less than 6.5. I recommended discontinuation of glimepiride if she is having hypoglycemic episodes. However she is very hesitant to switch medications because she is on limited income and therefore she elects to continue the Amaryl. I asked her to come back fasting so that I can check a fasting lipid panel. I will also check a urine microalbumin and if elevated I will increase her lisinopril. Blood pressure is acceptable today 08/03/15 Here today for follow-up. Since I last saw the patient, she had a stent placed in the artery in her right leg for peripheral artery disease. She continues to have claudication in the right leg but it is much improved. She denies any neuropathy or numbness in her feet. She denies any chest pain or shortness of breath or dyspnea on exertion. She denies any myalgias or right upper quadrant pain. Her blood pressure today is well controlled at 110/68. She denies any blurry vision or episodes of hypoglycemia. She declines HIV test. She declines hepatitis C screening. She refuses colonoscopy. She will consent Prevnar 13. I recommended a diabetic eye exam annually.  At that time, my plan was: The patient's blood pressure is excellent. I will check a hemoglobin A1c. Her goal hemoglobin A1c is less than 6.5. I  will also check a fasting lipid panel. Her goal LDL cholesterol is less than 70. I recommended an annual diabetic eye exam but she declined. I recommended hepatitis C testing and HIV testing but she declined. I recommended a colonoscopy but she declined.  11/24/15 Labs in June were great.  Hga1c was 6.3 and LDL was close to goal at 78.  Overall she has been doing well. She refuses a flu shot. Pneumovax 23 and Prevnar 13 are up-to-date. She refused a colonoscopy but would be willing to consent to a cologuard.  Overall her blood sugar has been averaging between 80 and 110. She denies any hypoglycemic episodes. She denies any chest pain shortness of breath or dyspnea on exertion. She does have some claudication in her legs. She denies any myalgias or right upper quadrant pain. Diabetic foot exam is performed today Past Medical History:  Diagnosis Date  . Asthma with COPD ([place])   . CAD (coronary artery disease)    a. [place] 2011: Diffuse distal and branch vessel disease - patient managed  medically, no interventional options. b. [place] 03/2014 - low risk, no ischemia.  . Complication of anesthesia    slow to wake up with last surgery in 2011   . Diabetes mellitus with circulatory complication ([place])   . Dyslipidemia   . Headache    hx of migraines   . Hypertension   . Osteoarthritis    severe right knee  R TKR  . PAD (peripheral artery disease) ([place])    a. Severe stenosis mid right SFA s/p atherectomy 03/25/09. b. peripheral angiography in 12/2011 which showed only about 50% diffuse right SFA stenosis.  . Pancreatitis 2003  . Renal artery stenosis ([place])    a. [place] 2011 - 40-50% left RAS.  . Tobacco use disorder    quit 11/10   Past Surgical History:  Procedure Laterality Date  . ABDOMINAL AORTAGRAM N/A 12/21/2011   Procedure: ABDOMINAL Maxcine Ham;  Surgeon: Wellington Hampshire, MD;  Location: [facility];  Service: Cardiovascular;  Laterality: N/A;  . CARDIAC CATHETERIZATION    . LOWER EXTREMITY ANGIOGRAM  Bilateral 05/27/2015   Procedure: Lower Extremity Angiogram;  Surgeon: Wellington Hampshire, MD;  Location: [facility];  Service: Cardiovascular;  Laterality: Bilateral;  . PERIPHERAL VASCULAR CATHETERIZATION N/A 05/27/2015   Procedure: Abdominal Aortogram;  Surgeon: Wellington Hampshire, MD;  Location: [facility];  Service: Cardiovascular;  Laterality: N/A;  . PERIPHERAL VASCULAR CATHETERIZATION Right 05/27/2015   Procedure: Peripheral Vascular Intervention;  Surgeon: Wellington Hampshire, MD;  Location: [facility];  Service: Cardiovascular;  Laterality: Right;  SFA  . TOTAL KNEE ARTHROPLASTY Right 2011  . TOTAL KNEE ARTHROPLASTY Left 05/22/2014   Procedure: LEFT TOTAL KNEE ARTHROPLASTY;  Surgeon: Latanya Maudlin, MD;  Location: [facility];  Service: Orthopedics;  Laterality: Left;  . TUBAL LIGATION  1980   Current Outpatient Prescriptions on File Prior to Visit  Medication Sig Dispense Refill  . ADVAIR DISKUS 250-50 MCG/DOSE AEPB INHALE 1 PUFF INTO THE LUNGS EVERY 12 HOURS 180 each 5  . albuterol (PROVENTIL HFA;VENTOLIN HFA) 108 (90 BASE) MCG/ACT inhaler Inhale 2 puffs into the lungs 2 (two) times daily as needed for wheezing or shortness of breath. 3 Inhaler 5  . aspirin 81 MG tablet Take 81 mg by mouth daily.      . Biotin w/ Vitamins C & E (HAIR/SKIN/NAILS PO) Take 1 tablet by mouth daily.    . bisoprolol-hydrochlorothiazide (ZIAC) 2.5-6.25 MG tablet TAKE 1 TABLET BY MOUTH EVERY DAY 90 tablet 3  . Blood Glucose Monitoring Suppl (BAYER CONTOUR MONITOR) w/Device KIT CHECK BLOOD SUGAR EVERY DAY 1 kit 0  . clopidogrel (PLAVIX) 75 MG tablet TAKE 1 TABLET BY MOUTH EVERY DAY (Patient taking differently: TAKE 75 MG BY MOUTH EVERY DAY) 30 tablet 11  . diphenhydrAMINE (BENADRYL) 25 mg capsule Take 25 mg by mouth every 6 (six) hours as needed for allergies.    . fish oil-omega-3 fatty acids 1000 MG capsule Take 2 capsules (2 g total) by mouth 2 (two) times daily. 360 capsule 3  . glimepiride  (AMARYL) 4 MG tablet TAKE 1 TABLET BY MOUTH EVERY DAY WITH LUNCH (Patient taking differently: TAKE 4 MG BY MOUTH EVERY DAY WITH LUNCH) 90 tablet 3  . glucose blood test strip Checks BS QD - E11.9  - Uses contour next meter 100 each 12  . GuaiFENesin (MUCUS RELIEF PO) Take 10 mLs by mouth as needed (for congestion).    Marland Kitchen HYDROcodone-acetaminophen (NORCO/VICODIN) 5-325 MG tablet Take 1 tablet  by mouth 4 (four) times daily. 120 tablet 0  . isosorbide mononitrate (IMDUR) 60 MG 24 hr tablet TAKE 1 TABLET BY MOUTH DAILY 90 tablet 0  . lisinopril (PRINIVIL,ZESTRIL) 10 MG tablet Take 1 tablet (10 mg total) by mouth daily. 90 tablet 4  . metFORMIN (GLUCOPHAGE) 1000 MG tablet TAKE 1 TABLET BY MOUTH TWICE DAILY WITH MEALS 180 tablet 0  . methocarbamol (ROBAXIN) 500 MG tablet TAKE 1 TABLET BY MOUTH EVERY 6 HOURS AS NEEDED FOR MUSCLE SPASMS 120 tablet 0  . methocarbamol (ROBAXIN) 500 MG tablet TAKE 1 TABLET BY MOUTH EVERY 6 HOURS AS NEEDED FOR MUSCLE SPASMS 120 tablet 1  . Multiple Vitamin (MULTIVITAMIN) tablet Take 1 tablet by mouth daily.      . ondansetron (ZOFRAN) 4 MG tablet TAKE 1 TABLET(4 MG) BY MOUTH EVERY 8 HOURS AS NEEDED FOR NAUSEA OR VOMITING 30 tablet 1  . pantoprazole (PROTONIX) 40 MG tablet Take 1 tablet (40 mg total) by mouth daily. 30 tablet 11  . pioglitazone (ACTOS) 30 MG tablet TAKE 1 TABLET(30 MG) BY MOUTH DAILY 30 tablet 3  . Probiotic Product (TRUBIOTICS PO) Take 1 capsule by mouth daily.    . rosuvastatin (CRESTOR) 40 MG tablet TAKE 1 TABLET BY MOUTH DAILY 30 tablet 3   No current facility-administered medications on file prior to visit.    Allergies  Allergen Reactions  . Gabapentin Other (See Comments)    Brain shakes  . Glucotrol [Glipizide] Rash and Other (See Comments)    Red rash  . Influenza Vaccines Other (See Comments)    Significant arm soreness requiring 1 year of physical therapy  . Latex Rash  . Milk-Related Compounds Rash and Other (See Comments)    Fever  .  Nortriptyline Other (See Comments)    BRAIN SHAKE  . Tetanus Toxoid Hives, Swelling, Rash and Other (See Comments)    Swelling to site of injection with rash and fever  . Invokana [Canagliflozin] Other (See Comments)    Yeast Infections   Social History   Social History  . Marital status: Married    Spouse name: N/A  . Number of children: N/A  . Years of education: 10   Occupational History  . Not on file.   Social History Main Topics  . Smoking status: Former Smoker    Quit date: 12/13/2008  . Smokeless tobacco: Never Used  . Alcohol use No  . Drug use: No  . Sexual activity: Not on file   Other Topics Concern  . Not on file   Social History Narrative   Denies any IV drug use or marijuana use.  Former smoker for 30 years, quit Nov. 2010. She is married with 2 children. Disabled from knee, not working.   Family History  Problem Relation Age of Onset  . Emphysema Father   . CVA Mother      Review of Systems  All other systems reviewed and are negative.      Objective:   Physical Exam  Constitutional: She is oriented to person, place, and time. She appears well-developed and well-nourished. No distress.  HENT:  Right Ear: External ear normal.  Left Ear: External ear normal.  Nose: Nose normal.  Mouth/Throat: Oropharynx is clear and moist.  Eyes: Conjunctivae are normal.  Neck: Neck supple. No JVD present. Thyromegaly present.  Cardiovascular: Normal rate, regular rhythm and normal heart sounds.   No murmur heard. Pulmonary/Chest: Effort normal and breath sounds normal. She has no wheezes. She has  no rales. She exhibits no tenderness.  Abdominal: Soft. Bowel sounds are normal. She exhibits no distension and no mass. There is no tenderness. There is no rebound and no guarding.  Musculoskeletal: She exhibits no edema.  Lymphadenopathy:    She has no cervical adenopathy.  Neurological: She is alert and oriented to person, place, and time. She has normal  reflexes. No cranial nerve deficit. She exhibits normal muscle tone. Coordination normal.  Skin: No rash noted. She is not diaphoretic. No erythema.  Vitals reviewed.  Patient has no neurologic deficit in her feet. She does have thick calluses on the lateral aspects of the MTP joints bilaterally. She has diminished pulses in the right foot checked at the dorsalis pedis and posterior tibialis       Assessment & Plan:   ASCVD (arteriosclerotic cardiovascular disease)  Type 2 diabetes mellitus without complication, without long-term current use of insulin (HCC)  Benign essential HTN  Pure hyperglyceridemia Her blood pressures is excellent. I will check a hemoglobin A1c. We had a discussion today about discontinuing Actos and replacing with jardiance given her history of cardiovascular disease to reduce her risk of cardiac death by 38%. She will be interested in trying this. I will await her lab work prior to making any change. I will also check a fasting lipid panel. Her goal LDL cholesterol is less than 70. I continue to encourage exercise on a daily basis along with weight loss. She refuses her flu shot. I will schedule an register the patient for cologuard.  I will also screen her for hepatitis C

## 2015-11-25 LAB — HIV ANTIBODY (ROUTINE TESTING W REFLEX): HIV 1&2 Ab, 4th Generation: NONREACTIVE

## 2015-11-25 LAB — HEMOGLOBIN A1C
Hgb A1c MFr Bld: 6.2 % — ABNORMAL HIGH (ref <5.7)
MEAN PLASMA GLUCOSE: 131 mg/dL

## 2015-11-25 LAB — HEPATITIS C ANTIBODY: HCV Ab: NEGATIVE

## 2015-11-25 LAB — MICROALBUMIN, URINE: Microalb, Ur: 1.6 mg/dL

## 2015-11-28 ENCOUNTER — Other Ambulatory Visit: Payer: Self-pay | Admitting: Family Medicine

## 2015-12-01 ENCOUNTER — Telehealth: Payer: Self-pay | Admitting: Family Medicine

## 2015-12-01 NOTE — Telephone Encounter (Signed)
Pt called her ins about Jardiance and it is too expensive for her. Also would like to get a refill on her pain med - Hydrocodone - Ok to refill??      (she will pick it up Friday and sign paperwork for cologuard)

## 2015-12-01 NOTE — Telephone Encounter (Signed)
Have the patient discontinue jardiance and resume actos.  I am okay with a refill on her hydrocodone

## 2015-12-02 MED ORDER — HYDROCODONE-ACETAMINOPHEN 5-325 MG PO TABS: 1.0000 | ORAL_TABLET | Freq: Four times a day (QID) | ORAL | 0 refills | Status: DC

## 2015-12-02 NOTE — Telephone Encounter (Signed)
RX printed, left up front and patient aware to pick up and aware of provider recommendations.

## 2015-12-16 ENCOUNTER — Other Ambulatory Visit: Payer: Self-pay | Admitting: Cardiology

## 2015-12-16 ENCOUNTER — Other Ambulatory Visit: Payer: Self-pay | Admitting: Family Medicine

## 2015-12-16 NOTE — Telephone Encounter (Signed)
ADVAIR DISKUS 250-50 MCG/DOSE AEPB  Medication  Date: 11/20/2015 [facility] Ordering/Authorizing: Donita BrooksWarren T Pickard, MD  Order Providers   Prescribing Provider Encounter Provider  Donita BrooksWarren T Pickard, MD Donita BrooksWarren T Pickard, MD  Medication Detail    Disp Refills Start End   ADVAIR DISKUS 250-50 MCG/DOSE AEPB 180 each 5 11/20/2015    Sig: INHALE 1 PUFF INTO THE LUNGS EVERY 12 HOURS   E-Prescribing Status: Receipt confirmed by pharmacy (11/20/2015 8:39 AM EDT)   Pharmacy   [facility] [id number] - [place], [place] - [address] AT [facility] [address] & [address]

## 2015-12-18 ENCOUNTER — Other Ambulatory Visit: Payer: Self-pay | Admitting: Family Medicine

## 2015-12-31 ENCOUNTER — Telehealth: Payer: Self-pay | Admitting: Family Medicine

## 2015-12-31 MED ORDER — HYDROCODONE-ACETAMINOPHEN 5-325 MG PO TABS: 1.0000 | ORAL_TABLET | Freq: Four times a day (QID) | ORAL | 0 refills | Status: DC

## 2015-12-31 NOTE — Telephone Encounter (Signed)
Last Rx was 10/20 for # 120.  Can print Rx for "Do NOT fill until 01/04/2016

## 2015-12-31 NOTE — Telephone Encounter (Signed)
Ok to refill 

## 2015-12-31 NOTE — Telephone Encounter (Signed)
Patient would like rx for her hydrocodone  [phone]

## 2016-01-01 NOTE — Telephone Encounter (Signed)
RX printed, left up front and patient aware to pick up  

## 2016-01-14 ENCOUNTER — Other Ambulatory Visit: Payer: Self-pay | Admitting: Family Medicine

## 2016-01-14 NOTE — Telephone Encounter (Signed)
Approved. # 120 + 2.  

## 2016-01-14 NOTE — Telephone Encounter (Signed)
Ok to refill 

## 2016-01-14 NOTE — Telephone Encounter (Signed)
Prescription sent to pharmacy.

## 2016-01-26 ENCOUNTER — Other Ambulatory Visit: Payer: Self-pay | Admitting: Family Medicine

## 2016-01-27 ENCOUNTER — Other Ambulatory Visit: Payer: Self-pay | Admitting: Family Medicine

## 2016-01-27 DIAGNOSIS — K861 Other chronic pancreatitis: Secondary | ICD-10-CM

## 2016-01-28 ENCOUNTER — Telehealth: Payer: Self-pay | Admitting: Family Medicine

## 2016-01-28 NOTE — Telephone Encounter (Signed)
ok 

## 2016-01-28 NOTE — Telephone Encounter (Signed)
Ok to refill 

## 2016-01-28 NOTE — Telephone Encounter (Signed)
Patient calling to get rx for hydrocodone [phone]

## 2016-01-29 MED ORDER — HYDROCODONE-ACETAMINOPHEN 5-325 MG PO TABS: 1.0000 | ORAL_TABLET | Freq: Four times a day (QID) | ORAL | 0 refills | Status: DC

## 2016-01-29 NOTE — Telephone Encounter (Signed)
RX printed, left up front and patient aware to pick up  

## 2016-02-18 ENCOUNTER — Other Ambulatory Visit: Payer: Self-pay | Admitting: Cardiovascular Disease

## 2016-02-18 DIAGNOSIS — I739 Peripheral vascular disease, unspecified: Secondary | ICD-10-CM

## 2016-02-25 ENCOUNTER — Other Ambulatory Visit: Payer: Self-pay | Admitting: Family Medicine

## 2016-02-25 DIAGNOSIS — K861 Other chronic pancreatitis: Secondary | ICD-10-CM

## 2016-02-29 ENCOUNTER — Telehealth: Payer: Self-pay | Admitting: Family Medicine

## 2016-02-29 NOTE — Telephone Encounter (Signed)
ok 

## 2016-02-29 NOTE — Telephone Encounter (Signed)
cb [phone], medication refill on hydrocodone.

## 2016-02-29 NOTE — Telephone Encounter (Signed)
Ok to refill 

## 2016-03-01 MED ORDER — HYDROCODONE-ACETAMINOPHEN 5-325 MG PO TABS: 1.0000 | ORAL_TABLET | Freq: Four times a day (QID) | ORAL | 0 refills | Status: DC

## 2016-03-01 NOTE — Telephone Encounter (Signed)
RX printed, left up front and patient aware to pick up via vm 

## 2016-03-08 ENCOUNTER — Ambulatory Visit (HOSPITAL_COMMUNITY)
Admission: RE | Admit: 2016-03-08 | Discharge: 2016-03-08 | Disposition: A | Discharge status: Home / Self Care | Payer: BLUE CROSS/BLUE SHIELD | Source: Ambulatory Visit | Attending: Cardiovascular Disease | Admitting: Cardiovascular Disease

## 2016-03-08 ENCOUNTER — Ambulatory Visit (INDEPENDENT_AMBULATORY_CARE_PROVIDER_SITE_OTHER): Payer: BLUE CROSS/BLUE SHIELD | Admitting: Cardiovascular Disease

## 2016-03-08 VITALS — BP 114/60 | HR 82 | Ht 63.0 in | Wt 196.0 lb

## 2016-03-08 DIAGNOSIS — E119 Type 2 diabetes mellitus without complications: Secondary | ICD-10-CM | POA: Insufficient documentation

## 2016-03-08 DIAGNOSIS — E782 Mixed hyperlipidemia: Secondary | ICD-10-CM

## 2016-03-08 DIAGNOSIS — I2510 Atherosclerotic heart disease of native coronary artery without angina pectoris: Secondary | ICD-10-CM | POA: Insufficient documentation

## 2016-03-08 DIAGNOSIS — F17200 Nicotine dependence, unspecified, uncomplicated: Secondary | ICD-10-CM | POA: Diagnosis not present

## 2016-03-08 DIAGNOSIS — E785 Hyperlipidemia, unspecified: Secondary | ICD-10-CM | POA: Insufficient documentation

## 2016-03-08 DIAGNOSIS — I70201 Unspecified atherosclerosis of native arteries of extremities, right leg: Secondary | ICD-10-CM | POA: Diagnosis not present

## 2016-03-08 DIAGNOSIS — I739 Peripheral vascular disease, unspecified: Secondary | ICD-10-CM

## 2016-03-08 DIAGNOSIS — Z95828 Presence of other vascular implants and grafts: Secondary | ICD-10-CM | POA: Diagnosis not present

## 2016-03-08 DIAGNOSIS — I10 Essential (primary) hypertension: Secondary | ICD-10-CM | POA: Insufficient documentation

## 2016-03-08 NOTE — Patient Instructions (Signed)
Medication Instructions:  Your physician recommends that you continue on your current medications as directed. Please refer to the Current Medication list given to you today.  Labwork: No new order.   Testing/Procedures: Your physician has requested that you have a lower extremity arterial duplex in 1 YEAR. This test is an ultrasound of the arteries in the legs. It looks at arterial blood flow in the legs. Allow one hour for Lower Arterial scans. There are no restrictions or special instructions  Follow-Up: Your physician wants you to follow-up in: 1 YEAR with Dr Kirke CorinArida. You will receive a reminder letter in the mail two months in advance. If you don't receive a letter, please call our office to schedule the follow-up appointment.   Any Other Special Instructions Will Be Listed Below (If Applicable).     If you need a refill on your cardiac medications before your next appointment, please call your pharmacy.

## 2016-03-08 NOTE — Progress Notes (Signed)
Cardiology Office Note   Date:  03/08/2016   ID:  [name], DOB 1955/04/22, MRN [id number]  PCP:  Odette Fraction, MD  Cardiologist:  Used to be Dr. Aundra Dubin  Chief Complaint  Patient presents with  . Follow-up    6 mth      History of Present Illness: [name] is a 61 y.o. female who presents for a follow up visit regarding PAD.  She has known history of peripheral arterial disease status post atherectomy of the right SFA in 2011 by Dr. Julianne Handler. She has other chronic medical conditions including CAD, DM, HTN, and hyperlipidemia. She is a former smoker.  She is s/p bilateral TKR.   She had worsening right leg claudication in April 2017. Angiography showed no significant aortoiliac disease. There was moderate right common femoral artery stenosis and severe discrete stenosis in the proximal right SFA with three-vessel runoff below the knee. There was no significant obstructive disease involving the left lower extremity. A self-expanding stent was placed to the proximal right SFA. She has been doing reasonably well with no chest pain or shortness of breath. She reports rare leg pain and is mostly bothered by bilateral knee joint discomfort.    Past Medical History:  Diagnosis Date  . Asthma with COPD ([place])   . CAD (coronary artery disease)    a. [place] 2011: Diffuse distal and branch vessel disease - patient managed medically, no interventional options. b. Nuc 03/2014 - low risk, no ischemia.  . Complication of anesthesia    slow to wake up with last surgery in 2011   . Diabetes mellitus with circulatory complication ([place])   . Dyslipidemia   . Headache    hx of migraines   . Hypertension   . Osteoarthritis    severe right knee  R TKR  . PAD (peripheral artery disease) ([place])    a. Severe stenosis mid right SFA s/p atherectomy 03/25/09. b. peripheral angiography in 12/2011 which showed only about 50% diffuse right SFA stenosis.  . Pancreatitis 2003  . Renal artery  stenosis ([place])    a. [place] 2011 - 40-50% left RAS.  . Tobacco use disorder    quit 11/10    Past Surgical History:  Procedure Laterality Date  . ABDOMINAL AORTAGRAM N/A 12/21/2011   Procedure: ABDOMINAL Maxcine Ham;  Surgeon: Wellington Hampshire, MD;  Location: [place] [facility];  Service: Cardiovascular;  Laterality: N/A;  . CARDIAC CATHETERIZATION    . LOWER EXTREMITY ANGIOGRAM Bilateral 05/27/2015   Procedure: Lower Extremity Angiogram;  Surgeon: Wellington Hampshire, MD;  Location: [place] CV LAB;  Service: Cardiovascular;  Laterality: Bilateral;  . PERIPHERAL VASCULAR CATHETERIZATION N/A 05/27/2015   Procedure: Abdominal Aortogram;  Surgeon: Wellington Hampshire, MD;  Location: [facility];  Service: Cardiovascular;  Laterality: N/A;  . PERIPHERAL VASCULAR CATHETERIZATION Right 05/27/2015   Procedure: Peripheral Vascular Intervention;  Surgeon: Wellington Hampshire, MD;  Location: [place];  Service: Cardiovascular;  Laterality: Right;  SFA  . TOTAL KNEE ARTHROPLASTY Right 2011  . TOTAL KNEE ARTHROPLASTY Left 05/22/2014   Procedure: LEFT TOTAL KNEE ARTHROPLASTY;  Surgeon: Latanya Maudlin, MD;  Location: [facility];  Service: Orthopedics;  Laterality: Left;  . TUBAL LIGATION  1980     Current Outpatient Prescriptions  Medication Sig Dispense Refill  . ADVAIR DISKUS 250-50 MCG/DOSE AEPB INHALE 1 PUFF BY MOUTH INTO THE LUNGS EVERY 12 HOURS 180 each 3  . aspirin 81 MG tablet Take 81 mg by  mouth daily.      . Biotin w/ Vitamins C & E (HAIR/SKIN/NAILS PO) Take 1 tablet by mouth daily.    . bisoprolol-hydrochlorothiazide (ZIAC) 2.5-6.25 MG tablet TAKE 1 TABLET BY MOUTH EVERY DAY 90 tablet 3  . Blood Glucose Monitoring Suppl (BAYER CONTOUR MONITOR) w/Device KIT CHECK BLOOD SUGAR EVERY DAY 1 kit 0  . clopidogrel (PLAVIX) 75 MG tablet TAKE 1 TABLET BY MOUTH EVERY DAY (Patient taking differently: TAKE 75 MG BY MOUTH EVERY DAY) 30 tablet 11  . diphenhydrAMINE (BENADRYL) 25 mg capsule Take 25 mg by mouth  every 6 (six) hours as needed for allergies.    . fish oil-omega-3 fatty acids 1000 MG capsule Take 2 capsules (2 g total) by mouth 2 (two) times daily. 360 capsule 3  . glimepiride (AMARYL) 4 MG tablet TAKE 1 TABLET BY MOUTH EVERY DAY WITH LUNCH (Patient taking differently: TAKE 4 MG BY MOUTH EVERY DAY WITH LUNCH) 90 tablet 3  . glucose blood test strip Checks BS QD - E11.9  - Uses contour next meter 100 each 12  . GuaiFENesin (MUCUS RELIEF PO) Take 10 mLs by mouth as needed (for congestion).    Marland Kitchen HYDROcodone-acetaminophen (NORCO/VICODIN) 5-325 MG tablet Take 1 tablet by mouth 4 (four) times daily. 120 tablet 0  . isosorbide mononitrate (IMDUR) 60 MG 24 hr tablet TAKE 1 TABLET BY MOUTH DAILY 90 tablet 0  . lisinopril (PRINIVIL,ZESTRIL) 10 MG tablet Take 1 tablet (10 mg total) by mouth daily. 90 tablet 4  . metFORMIN (GLUCOPHAGE) 1000 MG tablet TAKE 1 TABLET BY MOUTH TWICE DAILY WITH MEALS 180 tablet 0  . methocarbamol (ROBAXIN) 500 MG tablet TAKE 1 TABLET BY MOUTH EVERY 6 HOURS AS NEEDED FOR MUSCLE SPASMS 120 tablet 0  . Multiple Vitamin (MULTIVITAMIN) tablet Take 1 tablet by mouth daily.      . ondansetron (ZOFRAN) 4 MG tablet TAKE 1 TABLET(4 MG) BY MOUTH EVERY 8 HOURS AS NEEDED FOR NAUSEA OR VOMITING 30 tablet 0  . pantoprazole (PROTONIX) 40 MG tablet Take 1 tablet (40 mg total) by mouth daily. 30 tablet 11  . pioglitazone (ACTOS) 30 MG tablet TAKE 1 TABLET(30 MG) BY MOUTH DAILY 30 tablet 3  . Probiotic Product (TRUBIOTICS PO) Take 1 capsule by mouth daily.    . rosuvastatin (CRESTOR) 40 MG tablet TAKE 1 TABLET BY MOUTH DAILY 30 tablet 3  . VENTOLIN HFA 108 (90 Base) MCG/ACT inhaler INHALE 2 PUFFS BY MOUTH TWICE DAILY AS NEEDED FOR WHEEZING OR SHORTNESS OF BREATH 54 g 0   No current facility-administered medications for this visit.     Allergies:   Gabapentin; Glucotrol [glipizide]; Influenza vaccines; Latex; Milk-related compounds; Nortriptyline; Tetanus toxoid; and Invokana [canagliflozin]      Social History:  The patient  reports that she quit smoking about 7 years ago. She has never used smokeless tobacco. She reports that she does not drink alcohol or use drugs.   Family History:  The patient's family history includes CVA in her mother; Emphysema in her father.    ROS:  Please see the history of present illness.   Otherwise, review of systems are positive for .   All other systems are reviewed and negative.    PHYSICAL EXAM: VS:  BP 114/60   Pulse 82   Ht 5' 3" (1.6 m)   Wt 196 lb (88.9 kg)   BMI 34.72 kg/m  , BMI Body mass index is 34.72 kg/m. GEN: Well nourished, well developed, in no acute  distress HEENT: normal Neck: no JVD, carotid bruits, or masses Cardiac: RRR; no murmurs, rubs, or gallops,no edema  Respiratory:  clear to auscultation bilaterally, normal work of breathing GI: soft, nontender, nondistended, + BS MS: no deformity or atrophy Skin: warm and dry, no rash Neuro:  Strength and sensation are intact Psych: euthymic mood, full affect Vascular :  Femoral pulse is mildly diminished bilaterally.   EKG:  EKG is ordered today. Normal sinus rhythm with low voltage and no significant ST or T wave changes.   Recent Labs: 11/24/2015: ALT 18; BUN 28; Creat 1.10; Hemoglobin 12.8; Platelets 282; Potassium 5.2; Sodium 140    Lipid Panel    Component Value Date/Time   CHOL 167 11/24/2015 0826   TRIG 177 (H) 11/24/2015 0826   HDL 56 11/24/2015 0826   CHOLHDL 3.0 11/24/2015 0826   VLDL 35 (H) 11/24/2015 0826   LDLCALC 76 11/24/2015 0826   LDLDIRECT 50.6 01/02/2014 1003      Wt Readings from Last 3 Encounters:  03/08/16 196 lb (88.9 kg)  11/24/15 197 lb (89.4 kg)  08/03/15 196 lb (88.9 kg)       ASSESSMENT AND PLAN:  1.  PAD: Status post recent stent placement to the proximal right SFA. No significant claudication. Noninvasive vascular evaluation showed normal left ABI and mildly reduced on the right side at 0.81. The proximal right SFA  stent was patent with moderate diffuse disease affecting the right mid SFA which is a known finding. I recommend continuing medical therapy and repeat studies in 1 year.  2. Coronary artery disease involving native coronary arteries without angina:  Previous cardiac catheterization in 2011 showed distal LAD and small branch disease which is being managed medically.  3. Hyperlipidemia:  Continue treatment with high-dose rosuvastatin. Most recent LDL was 76 which is close to target.  4. Diabetes mellitus: I agree that Jardiance there is a good option given her heart disease. She was not able to afford the medication.  Disposition:   FU with me in 6 months  Signed,  Leslie Sacramento, MD  03/08/2016 11:45 AM    [place]

## 2016-03-17 ENCOUNTER — Other Ambulatory Visit: Payer: Self-pay | Admitting: Family Medicine

## 2016-03-17 DIAGNOSIS — E119 Type 2 diabetes mellitus without complications: Secondary | ICD-10-CM

## 2016-03-17 MED ORDER — GLIMEPIRIDE 4 MG PO TABS: ORAL_TABLET | ORAL | 3 refills | Status: DC

## 2016-03-24 ENCOUNTER — Other Ambulatory Visit: Payer: Self-pay | Admitting: Family Medicine

## 2016-03-24 DIAGNOSIS — K861 Other chronic pancreatitis: Secondary | ICD-10-CM

## 2016-03-28 ENCOUNTER — Telehealth: Payer: Self-pay | Admitting: Family Medicine

## 2016-03-28 NOTE — Telephone Encounter (Signed)
ok 

## 2016-03-28 NOTE — Telephone Encounter (Signed)
Pt needs refill on hydrocodone.  °

## 2016-03-28 NOTE — Telephone Encounter (Signed)
Ok to refill 

## 2016-03-30 MED ORDER — HYDROCODONE-ACETAMINOPHEN 5-325 MG PO TABS: 1.0000 | ORAL_TABLET | Freq: Four times a day (QID) | ORAL | 0 refills | Status: DC

## 2016-03-30 NOTE — Telephone Encounter (Signed)
RX printed, left up front and patient aware to pick up  

## 2016-04-11 ENCOUNTER — Other Ambulatory Visit: Payer: Self-pay | Admitting: Physician Assistant

## 2016-04-11 NOTE — Telephone Encounter (Signed)
ok 

## 2016-04-11 NOTE — Telephone Encounter (Signed)
Ok to refill 

## 2016-04-12 NOTE — Telephone Encounter (Signed)
Rx filled

## 2016-04-22 ENCOUNTER — Telehealth: Payer: Self-pay | Admitting: Family Medicine

## 2016-04-22 ENCOUNTER — Other Ambulatory Visit: Payer: Self-pay | Admitting: Family Medicine

## 2016-04-22 NOTE — Telephone Encounter (Signed)
PATIENT IS CALLING TO GET RX FOR HER HYDROCODONE [phone]

## 2016-04-22 NOTE — Telephone Encounter (Signed)
Ok to refill 

## 2016-04-23 ENCOUNTER — Other Ambulatory Visit: Payer: Self-pay | Admitting: Family Medicine

## 2016-04-23 DIAGNOSIS — K861 Other chronic pancreatitis: Secondary | ICD-10-CM

## 2016-04-25 MED ORDER — HYDROCODONE-ACETAMINOPHEN 5-325 MG PO TABS: 1.0000 | ORAL_TABLET | Freq: Four times a day (QID) | ORAL | 0 refills | Status: DC

## 2016-04-25 NOTE — Telephone Encounter (Signed)
RX printed, left up front and patient aware to pick up  

## 2016-04-25 NOTE — Telephone Encounter (Signed)
ok 

## 2016-04-26 ENCOUNTER — Ambulatory Visit: Payer: BLUE CROSS/BLUE SHIELD | Admitting: Family Medicine

## 2016-05-03 ENCOUNTER — Encounter: Payer: Self-pay | Admitting: Family Medicine

## 2016-05-03 ENCOUNTER — Ambulatory Visit (INDEPENDENT_AMBULATORY_CARE_PROVIDER_SITE_OTHER): Payer: BLUE CROSS/BLUE SHIELD | Admitting: Family Medicine

## 2016-05-03 VITALS — BP 100/62 | HR 84 | Temp 97.4°F | Resp 18 | Ht 63.0 in | Wt 200.0 lb

## 2016-05-03 DIAGNOSIS — M722 Plantar fascial fibromatosis: Secondary | ICD-10-CM | POA: Diagnosis not present

## 2016-05-03 DIAGNOSIS — M654 Radial styloid tenosynovitis [de Quervain]: Secondary | ICD-10-CM

## 2016-05-03 DIAGNOSIS — M19049 Primary osteoarthritis, unspecified hand: Secondary | ICD-10-CM

## 2016-05-03 MED ORDER — DICLOFENAC SODIUM 1 % TD GEL: 2.0000 g | Freq: Four times a day (QID) | TRANSDERMAL | 0 refills | Status: DC

## 2016-05-03 NOTE — Progress Notes (Signed)
Subjective:    Patient ID: [name], female    DOB: 1955/12/29, 61 y.o.   MRN: [id number]  HPI Patient presents with several weeks of pain in both wrists right greater than left. Pain is located over the radial aspect of the wrist and radiates into the right thumb. She has a positive Wynn Maudlin never. She has pain with resisted thumb extension. She also has tenderness to palpation around the first MCP joint.  Again the right side is worse than left side. She has not tried any current medication for this. She also presents with pain in her left heel at the insertion of the plantar fascia. It is more tender at first thing in the morning when she gets out of bed. It improves with walking but never completely goes away. After prolonged standing and walking it gets worse again. She is tender to palpation at the insertion of the plantar fascia on the calcaneus Past Medical History:  Diagnosis Date  . Asthma with COPD ([place])   . CAD (coronary artery disease)    a. [place] 2011: Diffuse distal and branch vessel disease - patient managed medically, no interventional options. b. Nuc 03/2014 - low risk, no ischemia.  . Complication of anesthesia    slow to wake up with last surgery in 2011   . Diabetes mellitus with circulatory complication ([place])   . Dyslipidemia   . Headache    hx of migraines   . Hypertension   . Osteoarthritis    severe right knee  R TKR  . PAD (peripheral artery disease) ([place])    a. Severe stenosis mid right SFA s/p atherectomy 03/25/09. b. peripheral angiography in 12/2011 which showed only about 50% diffuse right SFA stenosis.  . Pancreatitis 2003  . Renal artery stenosis ([place])    a. [place] 2011 - 40-50% left RAS.  . Tobacco use disorder    quit 11/10   Past Surgical History:  Procedure Laterality Date  . ABDOMINAL AORTAGRAM N/A 12/21/2011   Procedure: ABDOMINAL Maxcine Ham;  Surgeon: Wellington Hampshire, MD;  Location: [facility];  Service: Cardiovascular;  Laterality: N/A;  .  CARDIAC CATHETERIZATION    . LOWER EXTREMITY ANGIOGRAM Bilateral 05/27/2015   Procedure: Lower Extremity Angiogram;  Surgeon: Wellington Hampshire, MD;  Location: [facility];  Service: Cardiovascular;  Laterality: Bilateral;  . PERIPHERAL VASCULAR CATHETERIZATION N/A 05/27/2015   Procedure: Abdominal Aortogram;  Surgeon: Wellington Hampshire, MD;  Location: [facility];  Service: Cardiovascular;  Laterality: N/A;  . PERIPHERAL VASCULAR CATHETERIZATION Right 05/27/2015   Procedure: Peripheral Vascular Intervention;  Surgeon: Wellington Hampshire, MD;  Location: [place] [facility];  Service: Cardiovascular;  Laterality: Right;  SFA  . TOTAL KNEE ARTHROPLASTY Right 2011  . TOTAL KNEE ARTHROPLASTY Left 05/22/2014   Procedure: LEFT TOTAL KNEE ARTHROPLASTY;  Surgeon: Latanya Maudlin, MD;  Location: [facility];  Service: Orthopedics;  Laterality: Left;  . TUBAL LIGATION  1980   Current Outpatient Prescriptions on File Prior to Visit  Medication Sig Dispense Refill  . ADVAIR DISKUS 250-50 MCG/DOSE AEPB INHALE 1 PUFF BY MOUTH INTO THE LUNGS EVERY 12 HOURS 180 each 3  . aspirin 81 MG tablet Take 81 mg by mouth daily.      . Biotin w/ Vitamins C & E (HAIR/SKIN/NAILS PO) Take 1 tablet by mouth daily.    . bisoprolol-hydrochlorothiazide (ZIAC) 2.5-6.25 MG tablet TAKE 1 TABLET BY MOUTH EVERY DAY 90 tablet 3  . Blood Glucose Monitoring Suppl (  BAYER CONTOUR MONITOR) w/Device KIT CHECK BLOOD SUGAR EVERY DAY 1 kit 0  . clopidogrel (PLAVIX) 75 MG tablet TAKE 1 TABLET BY MOUTH EVERY DAY (Patient taking differently: TAKE 75 MG BY MOUTH EVERY DAY) 30 tablet 11  . diphenhydrAMINE (BENADRYL) 25 mg capsule Take 25 mg by mouth every 6 (six) hours as needed for allergies.    . fish oil-omega-3 fatty acids 1000 MG capsule Take 2 capsules (2 g total) by mouth 2 (two) times daily. 360 capsule 3  . glimepiride (AMARYL) 4 MG tablet TAKE 1 TABLET BY MOUTH EVERY DAY WITH LUNCH 90 tablet 3  . glucose blood test strip Checks BS QD -  E11.9  - Uses contour next meter 100 each 12  . GuaiFENesin (MUCUS RELIEF PO) Take 10 mLs by mouth as needed (for congestion).    Marland Kitchen HYDROcodone-acetaminophen (NORCO/VICODIN) 5-325 MG tablet Take 1 tablet by mouth 4 (four) times daily. 120 tablet 0  . isosorbide mononitrate (IMDUR) 60 MG 24 hr tablet TAKE 1 TABLET BY MOUTH DAILY 90 tablet 0  . lisinopril (PRINIVIL,ZESTRIL) 10 MG tablet Take 1 tablet (10 mg total) by mouth daily. 90 tablet 4  . metFORMIN (GLUCOPHAGE) 1000 MG tablet TAKE 1 TABLET BY MOUTH TWICE DAILY WITH MEALS 180 tablet 1  . methocarbamol (ROBAXIN) 500 MG tablet TAKE 1 TABLET BY MOUTH EVERY 6 HOURS AS NEEDED FOR MUCLE SPASMS 120 tablet 0  . Multiple Vitamin (MULTIVITAMIN) tablet Take 1 tablet by mouth daily.      . ondansetron (ZOFRAN) 4 MG tablet TAKE 1 TABLET(4 MG) BY MOUTH EVERY 8 HOURS AS NEEDED FOR NAUSEA OR VOMITING 30 tablet 0  . pantoprazole (PROTONIX) 40 MG tablet TAKE 1 TABLET BY MOUTH DAILY 30 tablet 11  . pioglitazone (ACTOS) 30 MG tablet TAKE 1 TABLET(30 MG) BY MOUTH DAILY 30 tablet 3  . Probiotic Product (TRUBIOTICS PO) Take 1 capsule by mouth daily.    . rosuvastatin (CRESTOR) 40 MG tablet TAKE 1 TABLET BY MOUTH DAILY 30 tablet 3  . VENTOLIN HFA 108 (90 Base) MCG/ACT inhaler INHALE 2 PUFFS BY MOUTH TWICE DAILY AS NEEDED FOR WHEEZING OR SHORTNESS OF BREATH 54 g 0   No current facility-administered medications on file prior to visit.    Allergies  Allergen Reactions  . Gabapentin Other (See Comments)    Brain shakes  . Glucotrol [Glipizide] Rash and Other (See Comments)    Red rash  . Influenza Vaccines Other (See Comments)    Significant arm soreness requiring 1 year of physical therapy  . Latex Rash  . Milk-Related Compounds Rash and Other (See Comments)    Fever  . Nortriptyline Other (See Comments)    BRAIN SHAKE  . Tetanus Toxoid Hives, Swelling, Rash and Other (See Comments)    Swelling to site of injection with rash and fever  . Invokana  [Canagliflozin] Other (See Comments)    Yeast Infections   Social History   Social History  . Marital status: Married    Spouse name: N/A  . Number of children: N/A  . Years of education: 10   Occupational History  . Not on file.   Social History Main Topics  . Smoking status: Former Smoker    Quit date: 12/13/2008  . Smokeless tobacco: Never Used  . Alcohol use No  . Drug use: No  . Sexual activity: Not on file   Other Topics Concern  . Not on file   Social History Narrative   Denies any IV  drug use or marijuana use.  Former smoker for 30 years, quit Nov. 2010. She is married with 2 children. Disabled from knee, not working.      Review of Systems  All other systems reviewed and are negative.      Objective:   Physical Exam  Cardiovascular: Normal rate, regular rhythm and normal heart sounds.   Pulmonary/Chest: Effort normal and breath sounds normal. No respiratory distress. She has no wheezes. She has no rales.  Musculoskeletal:       Right hand: She exhibits decreased range of motion, tenderness and bony tenderness. She exhibits normal capillary refill, no deformity, no laceration and no swelling. Normal sensation noted. Normal strength noted.       Hands:      Feet:  Vitals reviewed.         Assessment & Plan:  Tenosynovitis, de Quervain  Hand arthritis  Plantar fasciitis of left foot  I believe the patient has arthritis in the first MCP joint on the right hand along with de Quervain's tenosynovitis. Because her history of ASCVD and the fact she is taking Plavix I want her to avoid NSAIDs. Therefore I will treat the patient with Voltaren gel, 2 g applied topically 4 times a day. Also want her to wear a wrist thumb spica splint and recheck in 2 weeks. If no better, consider cortisone injection. I believe she also has plantar fasciitis.  She declines a cortisone injection today. She can also troubled pharyngeal in this area as well. Recheck in 2 weeks if  no better or sooner if worse

## 2016-05-04 ENCOUNTER — Telehealth: Payer: Self-pay | Admitting: *Deleted

## 2016-05-04 MED ORDER — DICLOFENAC SODIUM 1 % TD GEL: 2.0000 g | Freq: Four times a day (QID) | TRANSDERMAL | 0 refills | Status: AC

## 2016-05-04 NOTE — Telephone Encounter (Signed)
Received request from pharmacy for PA on Diclofenac Gel.   PA submitted.   Dx: OA- M19.90.  PA approved 05/04/2016- 02/13/2038.  Pharmacy made aware.

## 2016-05-10 ENCOUNTER — Other Ambulatory Visit: Payer: Self-pay | Admitting: Family Medicine

## 2016-05-10 NOTE — Telephone Encounter (Signed)
Ok to refill 

## 2016-05-10 NOTE — Telephone Encounter (Signed)
ok 

## 2016-05-20 ENCOUNTER — Other Ambulatory Visit: Payer: Self-pay | Admitting: Cardiology

## 2016-05-20 ENCOUNTER — Telehealth: Payer: Self-pay

## 2016-05-20 NOTE — Telephone Encounter (Signed)
Patient called and stated the  hand brace that was given for her right hand is working for her. Patient states it took her about a week to get it, but was wondering if she could get a brace for her left hand as well  for her left hand.

## 2016-05-20 NOTE — Telephone Encounter (Signed)
Sure, thumb spica splint for left wrist

## 2016-05-20 NOTE — Telephone Encounter (Signed)
Okay to refill under Dr Kirke Corin? Please advise. Thanks, MI

## 2016-05-20 NOTE — Telephone Encounter (Signed)
Followed by Kirke Corin for PAD

## 2016-05-20 NOTE — Telephone Encounter (Signed)
Okay to refill under Dr Jari Sportsman name.

## 2016-05-23 ENCOUNTER — Other Ambulatory Visit: Payer: Self-pay | Admitting: Family Medicine

## 2016-05-23 MED ORDER — HYDROCODONE-ACETAMINOPHEN 5-325 MG PO TABS: 1.0000 | ORAL_TABLET | Freq: Four times a day (QID) | ORAL | 0 refills | Status: DC

## 2016-05-23 NOTE — Telephone Encounter (Signed)
ok 

## 2016-05-23 NOTE — Telephone Encounter (Signed)
Pt needs refill on hydrocodone.  °

## 2016-05-23 NOTE — Telephone Encounter (Signed)
Pt will pick up Friday

## 2016-05-23 NOTE — Telephone Encounter (Signed)
LRF 04/27/16 #120  LOV 05/03/16  OK refill?

## 2016-05-24 NOTE — Telephone Encounter (Signed)
Rx faxed over to [facility] as requested by the patient. Patient aware

## 2016-05-25 ENCOUNTER — Other Ambulatory Visit: Payer: Self-pay | Admitting: Family Medicine

## 2016-05-25 DIAGNOSIS — K861 Other chronic pancreatitis: Secondary | ICD-10-CM

## 2016-06-10 ENCOUNTER — Other Ambulatory Visit: Payer: Self-pay | Admitting: Family Medicine

## 2016-06-12 ENCOUNTER — Other Ambulatory Visit: Payer: Self-pay | Admitting: Family Medicine

## 2016-06-13 NOTE — Telephone Encounter (Signed)
Ok to refill 

## 2016-06-13 NOTE — Telephone Encounter (Signed)
ok 

## 2016-06-17 ENCOUNTER — Other Ambulatory Visit: Payer: Self-pay | Admitting: Family Medicine

## 2016-06-20 ENCOUNTER — Telehealth: Payer: Self-pay | Admitting: Family Medicine

## 2016-06-20 NOTE — Telephone Encounter (Signed)
Ok to refill 

## 2016-06-20 NOTE — Telephone Encounter (Signed)
ok 

## 2016-06-20 NOTE — Telephone Encounter (Signed)
Needs refill on hydrocodone

## 2016-06-21 ENCOUNTER — Other Ambulatory Visit: Payer: Self-pay | Admitting: Cardiology

## 2016-06-21 MED ORDER — HYDROCODONE-ACETAMINOPHEN 5-325 MG PO TABS: 1.0000 | ORAL_TABLET | Freq: Four times a day (QID) | ORAL | 0 refills | Status: DC

## 2016-06-21 NOTE — Telephone Encounter (Signed)
RX printed, left up front and patient aware to pick up after 2 via vm 

## 2016-06-23 ENCOUNTER — Other Ambulatory Visit: Payer: Self-pay | Admitting: Family Medicine

## 2016-06-23 DIAGNOSIS — K861 Other chronic pancreatitis: Secondary | ICD-10-CM

## 2016-07-05 ENCOUNTER — Telehealth: Payer: Self-pay | Admitting: Family Medicine

## 2016-07-05 NOTE — Telephone Encounter (Signed)
Pt aware and appt made.

## 2016-07-05 NOTE — Telephone Encounter (Signed)
We could try cortisone shots in the heel.

## 2016-07-05 NOTE — Telephone Encounter (Signed)
Pt called and states that her plantar fascitis is really hurting her and would like to know if you have any suggestions on anything else that could help her? She is taking her pain med, muscle relaxer, using ankle brace, arch support and Voltaren gel with little to no effect on the pain.

## 2016-07-06 ENCOUNTER — Ambulatory Visit (INDEPENDENT_AMBULATORY_CARE_PROVIDER_SITE_OTHER): Payer: BLUE CROSS/BLUE SHIELD | Admitting: Family Medicine

## 2016-07-06 ENCOUNTER — Encounter: Payer: Self-pay | Admitting: Family Medicine

## 2016-07-06 VITALS — BP 118/60 | HR 84 | Temp 97.4°F | Resp 20 | Ht 62.75 in | Wt 201.0 lb

## 2016-07-06 DIAGNOSIS — M722 Plantar fascial fibromatosis: Secondary | ICD-10-CM

## 2016-07-06 NOTE — Progress Notes (Signed)
Subjective:    Patient ID: [name], female    DOB: [birth year], 61 y.o.   MRN: [id number]  HPI  Patient has been dealing with pain in the plantar aspect of her left foot for quite some time. In the past she was diagnosed with plantar fasciitis. The pain is located on the plantar surface of her left heel near the insertion of the plantar fascia. In that area there is no visible deformity. There is no plantar's wart or signs of foreign body. She is tender to palpation in that area. She states that the pain is worse first thing in the morning when she gets out of bed. It feels like someone stabbing her in her foot in that area with a knife. After walking on it, it will gradually improve slightly. However then after prolonged standing and walking and will worsen. She's been trying her stretches without relief. She is tried splinting and taping her feet without relief. She is here today for cortisone injection Past Medical History:  Diagnosis Date  . Asthma with COPD ([place])   . CAD (coronary artery disease)    a. [place] 2011: Diffuse distal and branch vessel disease - patient managed medically, no interventional options. b. Nuc 03/2014 - low risk, no ischemia.  . Complication of anesthesia    slow to wake up with last surgery in 2011   . Diabetes mellitus with circulatory complication ([place])   . Dyslipidemia   . Headache    hx of migraines   . Hypertension   . Osteoarthritis    severe right knee  R TKR  . PAD (peripheral artery disease) ([place])    a. Severe stenosis mid right SFA s/p atherectomy 03/25/09. b. peripheral angiography in 12/2011 which showed only about 50% diffuse right SFA stenosis.  . Pancreatitis 2003  . Renal artery stenosis ([place])    a. [place] 2011 - 40-50% left RAS.  . Tobacco use disorder    quit 11/10   Past Surgical History:  Procedure Laterality Date  . ABDOMINAL AORTAGRAM N/A 12/21/2011   Procedure: ABDOMINAL Maxcine Ham;  Surgeon: Wellington Hampshire, MD;  Location: [facility]  [facility];  Service: Cardiovascular;  Laterality: N/A;  . CARDIAC CATHETERIZATION    . LOWER EXTREMITY ANGIOGRAM Bilateral 05/27/2015   Procedure: Lower Extremity Angiogram;  Surgeon: Wellington Hampshire, MD;  Location: [facility];  Service: Cardiovascular;  Laterality: Bilateral;  . PERIPHERAL VASCULAR CATHETERIZATION N/A 05/27/2015   Procedure: Abdominal Aortogram;  Surgeon: Wellington Hampshire, MD;  Location: [facility];  Service: Cardiovascular;  Laterality: N/A;  . PERIPHERAL VASCULAR CATHETERIZATION Right 05/27/2015   Procedure: Peripheral Vascular Intervention;  Surgeon: Wellington Hampshire, MD;  Location: [facility];  Service: Cardiovascular;  Laterality: Right;  SFA  . TOTAL KNEE ARTHROPLASTY Right 2011  . TOTAL KNEE ARTHROPLASTY Left 05/22/2014   Procedure: LEFT TOTAL KNEE ARTHROPLASTY;  Surgeon: Latanya Maudlin, MD;  Location: [facility];  Service: Orthopedics;  Laterality: Left;  . TUBAL LIGATION  1980   Current Outpatient Prescriptions on File Prior to Visit  Medication Sig Dispense Refill  . ADVAIR DISKUS 250-50 MCG/DOSE AEPB INHALE 1 PUFF BY MOUTH INTO THE LUNGS EVERY 12 HOURS 180 each 3  . aspirin 81 MG tablet Take 81 mg by mouth daily.      . Biotin w/ Vitamins C & E (HAIR/SKIN/NAILS PO) Take 1 tablet by mouth daily.    . bisoprolol-hydrochlorothiazide (ZIAC) 2.5-6.25 MG tablet TAKE 1 TABLET BY MOUTH  EVERY DAY 90 tablet 0  . Blood Glucose Monitoring Suppl (BAYER CONTOUR MONITOR) w/Device KIT CHECK BLOOD SUGAR EVERY DAY 1 kit 0  . clopidogrel (PLAVIX) 75 MG tablet TAKE 1 TABLET BY MOUTH EVERY DAY 30 tablet 8  . diclofenac sodium (VOLTAREN) 1 % GEL Apply 2 g topically 4 (four) times daily. 100 g 0  . diphenhydrAMINE (BENADRYL) 25 mg capsule Take 25 mg by mouth every 6 (six) hours as needed for allergies.    . fish oil-omega-3 fatty acids 1000 MG capsule Take 2 capsules (2 g total) by mouth 2 (two) times daily. 360 capsule 3  . glimepiride (AMARYL) 4 MG tablet TAKE 1 TABLET BY  MOUTH EVERY DAY WITH LUNCH 90 tablet 3  . glucose blood test strip Checks BS QD - E11.9  - Uses contour next meter 100 each 12  . GuaiFENesin (MUCUS RELIEF PO) Take 10 mLs by mouth as needed (for congestion).    Marland Kitchen HYDROcodone-acetaminophen (NORCO/VICODIN) 5-325 MG tablet Take 1 tablet by mouth 4 (four) times daily. 120 tablet 0  . isosorbide mononitrate (IMDUR) 60 MG 24 hr tablet TAKE 1 TABLET BY MOUTH DAILY 90 tablet 0  . lisinopril (PRINIVIL,ZESTRIL) 10 MG tablet Take 1 tablet (10 mg total) by mouth daily. 90 tablet 4  . metFORMIN (GLUCOPHAGE) 1000 MG tablet TAKE 1 TABLET BY MOUTH TWICE DAILY WITH MEALS 180 tablet 1  . methocarbamol (ROBAXIN) 500 MG tablet TAKE 1 TABLET BY MOUTH EVERY 6 HOURS AS NEEDED FOR MUSCLE SPASMS 120 tablet 0  . Multiple Vitamin (MULTIVITAMIN) tablet Take 1 tablet by mouth daily.      . ondansetron (ZOFRAN) 4 MG tablet TAKE 1 TABLET(4 MG) BY MOUTH EVERY 8 HOURS AS NEEDED FOR NAUSEA OR VOMITING 30 tablet 0  . pantoprazole (PROTONIX) 40 MG tablet TAKE 1 TABLET BY MOUTH DAILY 30 tablet 11  . pioglitazone (ACTOS) 30 MG tablet TAKE 1 TABLET(30 MG) BY MOUTH DAILY 30 tablet 0  . Probiotic Product (TRUBIOTICS PO) Take 1 capsule by mouth daily.    . rosuvastatin (CRESTOR) 40 MG tablet TAKE 1 TABLET BY MOUTH DAILY 30 tablet 0  . VENTOLIN HFA 108 (90 Base) MCG/ACT inhaler INHALE 2 PUFFS BY MOUTH TWICE DAILY AS NEEDED FOR WHEEZING OR SHORTNESS OF BREATH 54 g 0   No current facility-administered medications on file prior to visit.    Allergies  Allergen Reactions  . Gabapentin Other (See Comments)    Brain shakes  . Glucotrol [Glipizide] Rash and Other (See Comments)    Red rash  . Influenza Vaccines Other (See Comments)    Significant arm soreness requiring 1 year of physical therapy  . Latex Rash  . Milk-Related Compounds Rash and Other (See Comments)    Fever  . Nortriptyline Other (See Comments)    BRAIN SHAKE  . Tetanus Toxoid Hives, Swelling, Rash and Other (See  Comments)    Swelling to site of injection with rash and fever  . Invokana [Canagliflozin] Other (See Comments)    Yeast Infections   Social History   Social History  . Marital status: Married    Spouse name: N/A  . Number of children: N/A  . Years of education: 10   Occupational History  . Not on file.   Social History Main Topics  . Smoking status: Former Smoker    Quit date: 12/13/2008  . Smokeless tobacco: Never Used  . Alcohol use No  . Drug use: No  . Sexual activity: Not on file  Other Topics Concern  . Not on file   Social History Narrative   Denies any IV drug use or marijuana use.  Former smoker for 30 years, quit Nov. 2010. She is married with 2 children. Disabled from knee, not working.     Review of Systems  All other systems reviewed and are negative.      Objective:   Physical Exam  Constitutional: She appears well-developed and well-nourished.  Cardiovascular: Normal rate, regular rhythm and normal heart sounds.   Pulmonary/Chest: Effort normal and breath sounds normal.  Musculoskeletal:       Left foot: There is tenderness and bony tenderness. There is no swelling, normal capillary refill, no deformity and no laceration.       Feet:  Vitals reviewed.         Assessment & Plan:  Plantar fasciitis of left foot  The area was prepped with Betadine. The skin was then anesthetized with ethyl chloride spray. A mixture of 1 mL of lidocaine and 1 mL of 40 mg per mL Kenalog was then injected at the point of maximum tenderness demarcated on the diagram with a red at. Patient tolerated the injection well without complication. Follow-up in 2 weeks if no better or sooner if worse

## 2016-07-14 ENCOUNTER — Other Ambulatory Visit: Payer: Self-pay | Admitting: Family Medicine

## 2016-07-15 NOTE — Telephone Encounter (Signed)
Ok to refill 

## 2016-07-15 NOTE — Telephone Encounter (Signed)
ok 

## 2016-07-15 NOTE — Telephone Encounter (Signed)
rx called into pharmacy

## 2016-07-18 ENCOUNTER — Telehealth: Payer: Self-pay | Admitting: Family Medicine

## 2016-07-18 NOTE — Telephone Encounter (Signed)
Pt needs refill on hydrocodone.  °

## 2016-07-18 NOTE — Telephone Encounter (Signed)
Okay to refill? 

## 2016-07-18 NOTE — Telephone Encounter (Signed)
Ok to refill??      LOV - 07/06/16 LRF - 06/21/16 (she calls early for it so we have time to get it ready)

## 2016-07-19 MED ORDER — HYDROCODONE-ACETAMINOPHEN 5-325 MG PO TABS: 1.0000 | ORAL_TABLET | Freq: Four times a day (QID) | ORAL | 0 refills | Status: DC

## 2016-07-20 NOTE — Telephone Encounter (Signed)
RX printed, left up front and patient aware to pick up  

## 2016-07-21 ENCOUNTER — Other Ambulatory Visit: Payer: Self-pay | Admitting: Family Medicine

## 2016-07-21 DIAGNOSIS — K861 Other chronic pancreatitis: Secondary | ICD-10-CM

## 2016-08-12 ENCOUNTER — Other Ambulatory Visit: Payer: Self-pay | Admitting: Family Medicine

## 2016-08-16 ENCOUNTER — Telehealth: Payer: Self-pay | Admitting: Family Medicine

## 2016-08-16 ENCOUNTER — Other Ambulatory Visit: Payer: Self-pay | Admitting: Family Medicine

## 2016-08-16 DIAGNOSIS — K861 Other chronic pancreatitis: Secondary | ICD-10-CM

## 2016-08-16 NOTE — Telephone Encounter (Signed)
ok 

## 2016-08-16 NOTE — Telephone Encounter (Signed)
Pt requesting HYDROcodone-acetaminophen (NORCO/VICODIN) 5-325 MG   CB# [phone]

## 2016-08-16 NOTE — Telephone Encounter (Signed)
Ok to refill 

## 2016-08-18 ENCOUNTER — Telehealth: Payer: Self-pay | Admitting: Family Medicine

## 2016-08-18 MED ORDER — HYDROCODONE-ACETAMINOPHEN 5-325 MG PO TABS: 1.0000 | ORAL_TABLET | Freq: Four times a day (QID) | ORAL | 0 refills | Status: DC

## 2016-08-18 NOTE — Telephone Encounter (Signed)
Requesting a refill Methocarbamol 500 mg - Ok to refill??

## 2016-08-18 NOTE — Telephone Encounter (Signed)
RX printed, left up front and patient aware to pick up  

## 2016-08-19 ENCOUNTER — Other Ambulatory Visit: Payer: Self-pay | Admitting: Family Medicine

## 2016-08-19 MED ORDER — METHOCARBAMOL 500 MG PO TABS: ORAL_TABLET | ORAL | 0 refills | Status: DC

## 2016-08-19 NOTE — Telephone Encounter (Signed)
ok 

## 2016-08-19 NOTE — Telephone Encounter (Signed)
Medication called/sent to requested pharmacy  

## 2016-09-05 ENCOUNTER — Other Ambulatory Visit: Payer: Self-pay | Admitting: Family Medicine

## 2016-09-05 DIAGNOSIS — I10 Essential (primary) hypertension: Secondary | ICD-10-CM

## 2016-09-14 ENCOUNTER — Other Ambulatory Visit: Payer: Self-pay | Admitting: Cardiology

## 2016-09-14 ENCOUNTER — Telehealth: Payer: Self-pay | Admitting: Family Medicine

## 2016-09-14 NOTE — Telephone Encounter (Signed)
ok 

## 2016-09-14 NOTE — Telephone Encounter (Signed)
Ok to refill 

## 2016-09-14 NOTE — Telephone Encounter (Signed)
Refill on hydrocodone

## 2016-09-15 MED ORDER — HYDROCODONE-ACETAMINOPHEN 5-325 MG PO TABS: 1.0000 | ORAL_TABLET | Freq: Four times a day (QID) | ORAL | 0 refills | Status: DC

## 2016-09-15 NOTE — Telephone Encounter (Signed)
RX printed, left up front and patient aware to pick up  

## 2016-09-17 ENCOUNTER — Other Ambulatory Visit: Payer: Self-pay | Admitting: Family Medicine

## 2016-09-17 DIAGNOSIS — K861 Other chronic pancreatitis: Secondary | ICD-10-CM

## 2016-09-19 NOTE — Telephone Encounter (Signed)
Ok to refill 

## 2016-09-20 NOTE — Telephone Encounter (Signed)
ok 

## 2016-10-10 ENCOUNTER — Telehealth: Payer: Self-pay | Admitting: Family Medicine

## 2016-10-10 MED ORDER — HYDROCODONE-ACETAMINOPHEN 5-325 MG PO TABS: 1.0000 | ORAL_TABLET | Freq: Four times a day (QID) | ORAL | 0 refills | Status: DC

## 2016-10-10 NOTE — Telephone Encounter (Signed)
Patient is requesting a refill on Hydrocodone - Ok to refill??         Not due till 10/16/16 but will p/u 10/14/16 d/t holiday

## 2016-10-10 NOTE — Telephone Encounter (Signed)
ok 

## 2016-10-10 NOTE — Telephone Encounter (Signed)
RX printed, left up front and patient aware to pick up on 10/14/16 

## 2016-10-11 ENCOUNTER — Other Ambulatory Visit: Payer: Self-pay | Admitting: Family Medicine

## 2016-10-14 ENCOUNTER — Other Ambulatory Visit: Payer: Self-pay | Admitting: *Deleted

## 2016-10-18 ENCOUNTER — Other Ambulatory Visit: Payer: Self-pay | Admitting: Family Medicine

## 2016-10-18 DIAGNOSIS — K861 Other chronic pancreatitis: Secondary | ICD-10-CM

## 2016-10-18 MED ORDER — BISOPROLOL-HYDROCHLOROTHIAZIDE 2.5-6.25 MG PO TABS: 1.0000 | ORAL_TABLET | Freq: Every day | ORAL | 1 refills | Status: DC

## 2016-10-18 NOTE — Telephone Encounter (Signed)
ok 

## 2016-10-18 NOTE — Telephone Encounter (Signed)
Ok to refill Methocarbamol

## 2016-10-18 NOTE — Telephone Encounter (Signed)
REFILL 

## 2016-10-28 ENCOUNTER — Ambulatory Visit (INDEPENDENT_AMBULATORY_CARE_PROVIDER_SITE_OTHER): Payer: BLUE CROSS/BLUE SHIELD | Admitting: Family Medicine

## 2016-10-28 ENCOUNTER — Encounter: Payer: Self-pay | Admitting: Family Medicine

## 2016-10-28 VITALS — BP 120/68 | HR 88 | Temp 99.1°F | Resp 16 | Ht 62.5 in | Wt 205.0 lb

## 2016-10-28 DIAGNOSIS — M722 Plantar fascial fibromatosis: Secondary | ICD-10-CM

## 2016-10-28 NOTE — Progress Notes (Signed)
Subjective:    Patient ID: [name], female    DOB: 09-Jun-1955, 61 y.o.   MRN: [id number]  HPI 07/06/16 Patient has been dealing with pain in the plantar aspect of her left foot for quite some time. In the past she was diagnosed with plantar fasciitis. The pain is located on the plantar surface of her left heel near the insertion of the plantar fascia. In that area there is no visible deformity. There is no plantar's wart or signs of foreign body. She is tender to palpation in that area. She states that the pain is worse first thing in the morning when she gets out of bed. It feels like someone stabbing her in her foot in that area with a knife. After walking on it, it will gradually improve slightly. However then after prolonged standing and walking and will worsen. She's been trying her stretches without relief. She is tried splinting and taping her feet without relief. She is here today for cortisone injection.  At that time, my plan was: The area was prepped with Betadine. The skin was then anesthetized with ethyl chloride spray. A mixture of 1 mL of lidocaine and 1 mL of 40 mg per mL Kenalog was then injected at the point of maximum tenderness demarcated on the diagram with a red at. Patient tolerated the injection well without complication. Follow-up in 2 weeks if no better or sooner if worse  10/28/16 She received significant benefit from the first cortisone shot bu the pain has returned over the last month.  Severe pain is located on the plantar surface of the calcaneous near the insertion of the planatr fascia.  Pain worse first thing in the am or after sitting for a long time.  She goes barefoot or wears sandals all the time.  She is doing no stretches.    Past Medical History:  Diagnosis Date  . Asthma with COPD ([place])   . CAD (coronary artery disease)    a. [place] 2011: Diffuse distal and branch vessel disease - patient managed medically, no interventional options. b. Nuc 03/2014 - low  risk, no ischemia.  . Complication of anesthesia    slow to wake up with last surgery in 2011   . Diabetes mellitus with circulatory complication ([place])   . Dyslipidemia   . Headache    hx of migraines   . Hypertension   . Osteoarthritis    severe right knee  R TKR  . PAD (peripheral artery disease) ([place])    a. Severe stenosis mid right SFA s/p atherectomy 03/25/09. b. peripheral angiography in 12/2011 which showed only about 50% diffuse right SFA stenosis.  . Pancreatitis 2003  . Renal artery stenosis ([place])    a. [place] 2011 - 40-50% left RAS.  . Tobacco use disorder    quit 11/10   Past Surgical History:  Procedure Laterality Date  . ABDOMINAL AORTAGRAM N/A 12/21/2011   Procedure: ABDOMINAL Maxcine Ham;  Surgeon: Wellington Hampshire, MD;  Location: [facility];  Service: Cardiovascular;  Laterality: N/A;  . CARDIAC CATHETERIZATION    . LOWER EXTREMITY ANGIOGRAM Bilateral 05/27/2015   Procedure: Lower Extremity Angiogram;  Surgeon: Wellington Hampshire, MD;  Location: [facility];  Service: Cardiovascular;  Laterality: Bilateral;  . PERIPHERAL VASCULAR CATHETERIZATION N/A 05/27/2015   Procedure: Abdominal Aortogram;  Surgeon: Wellington Hampshire, MD;  Location: [facility];  Service: Cardiovascular;  Laterality: N/A;  . PERIPHERAL VASCULAR CATHETERIZATION Right 05/27/2015   Procedure: Peripheral  Vascular Intervention;  Surgeon: Wellington Hampshire, MD;  Location: [facility];  Service: Cardiovascular;  Laterality: Right;  SFA  . TOTAL KNEE ARTHROPLASTY Right 2011  . TOTAL KNEE ARTHROPLASTY Left 05/22/2014   Procedure: LEFT TOTAL KNEE ARTHROPLASTY;  Surgeon: Latanya Maudlin, MD;  Location: [facility];  Service: Orthopedics;  Laterality: Left;  . TUBAL LIGATION  1980   Current Outpatient Prescriptions on File Prior to Visit  Medication Sig Dispense Refill  . ADVAIR DISKUS 250-50 MCG/DOSE AEPB INHALE 1 PUFF BY MOUTH INTO THE LUNGS EVERY 12 HOURS 180 each 3  . aspirin 81 MG tablet Take 81 mg  by mouth daily.      . Biotin w/ Vitamins C & E (HAIR/SKIN/NAILS PO) Take 1 tablet by mouth daily.    . bisoprolol-hydrochlorothiazide (ZIAC) 2.5-6.25 MG tablet Take 1 tablet by mouth daily. Needs office visit for further refills 30 tablet 1  . Blood Glucose Monitoring Suppl (BAYER CONTOUR MONITOR) w/Device KIT CHECK BLOOD SUGAR EVERY DAY 1 kit 0  . clopidogrel (PLAVIX) 75 MG tablet TAKE 1 TABLET BY MOUTH EVERY DAY 30 tablet 8  . CONTOUR NEXT TEST test strip CHECK BLOOD SUGAR EVERY DAY 100 each 5  . diclofenac sodium (VOLTAREN) 1 % GEL Apply 2 g topically 4 (four) times daily. 100 g 0  . diphenhydrAMINE (BENADRYL) 25 mg capsule Take 25 mg by mouth every 6 (six) hours as needed for allergies.    . fish oil-omega-3 fatty acids 1000 MG capsule Take 2 capsules (2 g total) by mouth 2 (two) times daily. 360 capsule 3  . glimepiride (AMARYL) 4 MG tablet TAKE 1 TABLET BY MOUTH EVERY DAY WITH LUNCH 90 tablet 3  . GuaiFENesin (MUCUS RELIEF PO) Take 10 mLs by mouth as needed (for congestion).    Marland Kitchen HYDROcodone-acetaminophen (NORCO/VICODIN) 5-325 MG tablet Take 1 tablet by mouth 4 (four) times daily. 120 tablet 0  . isosorbide mononitrate (IMDUR) 60 MG 24 hr tablet TAKE 1 TABLET BY MOUTH DAILY 90 tablet 2  . lisinopril (PRINIVIL,ZESTRIL) 10 MG tablet TAKE 1 TABLET BY MOUTH DAILY 90 tablet 2  . metFORMIN (GLUCOPHAGE) 1000 MG tablet TAKE 1 TABLET BY MOUTH TWICE DAILY WITH MEALS 180 tablet 0  . methocarbamol (ROBAXIN) 500 MG tablet TAKE 1 TABLET BY MOUTH EVERY 6 HOURS AS NEEDED FOR MUSCLE SPASMS 120 tablet 0  . Multiple Vitamin (MULTIVITAMIN) tablet Take 1 tablet by mouth daily.      . ondansetron (ZOFRAN) 4 MG tablet TAKE 1 TABLET(4 MG) BY MOUTH EVERY 8 HOURS AS NEEDED FOR NAUSEA OR VOMITING 30 tablet 0  . pantoprazole (PROTONIX) 40 MG tablet TAKE 1 TABLET BY MOUTH DAILY 30 tablet 11  . pioglitazone (ACTOS) 30 MG tablet TAKE 1 TABLET(30 MG) BY MOUTH DAILY 30 tablet 3  . Probiotic Product (TRUBIOTICS PO) Take 1  capsule by mouth daily.    . rosuvastatin (CRESTOR) 40 MG tablet TAKE 1 TABLET BY MOUTH DAILY 30 tablet 3  . VENTOLIN HFA 108 (90 Base) MCG/ACT inhaler INHALE 2 PUFFS BY MOUTH TWICE DAILY AS NEEDED FOR WHEEZING OR SHORTNESS OF BREATH 54 g 3   No current facility-administered medications on file prior to visit.    Allergies  Allergen Reactions  . Gabapentin Other (See Comments)    Brain shakes  . Glucotrol [Glipizide] Rash and Other (See Comments)    Red rash  . Influenza Vaccines Other (See Comments)    Significant arm soreness requiring 1 year of physical therapy  .  Latex Rash  . Milk-Related Compounds Rash and Other (See Comments)    Fever  . Nortriptyline Other (See Comments)    BRAIN SHAKE  . Tetanus Toxoid Hives, Swelling, Rash and Other (See Comments)    Swelling to site of injection with rash and fever  . Invokana [Canagliflozin] Other (See Comments)    Yeast Infections   Social History   Social History  . Marital status: Married    Spouse name: N/A  . Number of children: N/A  . Years of education: 10   Occupational History  . Not on file.   Social History Main Topics  . Smoking status: Former Smoker    Quit date: 12/13/2008  . Smokeless tobacco: Never Used  . Alcohol use No  . Drug use: No  . Sexual activity: Not on file   Other Topics Concern  . Not on file   Social History Narrative   Denies any IV drug use or marijuana use.  Former smoker for 30 years, quit Nov. 2010. She is married with 2 children. Disabled from knee, not working.     Review of Systems  All other systems reviewed and are negative.      Objective:   Physical Exam  Constitutional: She appears well-developed and well-nourished.  Cardiovascular: Normal rate, regular rhythm and normal heart sounds.   Pulmonary/Chest: Effort normal and breath sounds normal.  Musculoskeletal:       Left foot: There is tenderness and bony tenderness. There is no swelling, normal capillary refill, no  deformity and no laceration.       Feet:  Vitals reviewed.         Assessment & Plan:  Plantar fasciitis of left foot discussed risks and benefits and she elects repeat injection. The area was prepped with Betadine. The skin was then anesthetized with ethyl chloride spray. A mixture of 1 mL of lidocaine and 1 mL of 40 mg per mL Kenalog was then injected at the point of maximum tenderness demarcated on the diagram with a red at. Patient tolerated the injection well without complication. Follow-up in 2 weeks if no better or sooner if worse Recommended daily stretches including the towel stretch and the ice can roll.  Also recommended wearing supportive tennis shoes constantly and avoiding sandals or bare feet.  If no better, consult Dr. Paulla Dolly.

## 2016-11-07 ENCOUNTER — Telehealth: Payer: Self-pay | Admitting: Family Medicine

## 2016-11-07 DIAGNOSIS — M722 Plantar fascial fibromatosis: Secondary | ICD-10-CM

## 2016-11-07 NOTE — Telephone Encounter (Signed)
Pt called and states that her foot is no better - per lov if no better consult Dr. Charlsie Merles - referral placed.   Patient is requesting a refill on Hydrocodone - Ok to refill??       (pt will p/u Friday 11/11/16)

## 2016-11-08 MED ORDER — HYDROCODONE-ACETAMINOPHEN 5-325 MG PO TABS: 1.0000 | ORAL_TABLET | Freq: Four times a day (QID) | ORAL | 0 refills | Status: DC

## 2016-11-08 NOTE — Telephone Encounter (Signed)
RX printed, left up front and patient aware to pick up on Friday 

## 2016-11-08 NOTE — Telephone Encounter (Signed)
Ok for refill? 

## 2016-11-16 ENCOUNTER — Other Ambulatory Visit: Payer: Self-pay | Admitting: Family Medicine

## 2016-11-16 DIAGNOSIS — K861 Other chronic pancreatitis: Secondary | ICD-10-CM

## 2016-11-17 ENCOUNTER — Ambulatory Visit (INDEPENDENT_AMBULATORY_CARE_PROVIDER_SITE_OTHER): Payer: BLUE CROSS/BLUE SHIELD | Admitting: Podiatry

## 2016-11-17 ENCOUNTER — Ambulatory Visit (INDEPENDENT_AMBULATORY_CARE_PROVIDER_SITE_OTHER): Payer: BLUE CROSS/BLUE SHIELD

## 2016-11-17 ENCOUNTER — Encounter: Payer: Self-pay | Admitting: Podiatry

## 2016-11-17 VITALS — BP 130/75 | HR 87 | Resp 16

## 2016-11-17 DIAGNOSIS — M722 Plantar fascial fibromatosis: Secondary | ICD-10-CM | POA: Diagnosis not present

## 2016-11-17 DIAGNOSIS — M79672 Pain in left foot: Secondary | ICD-10-CM

## 2016-11-17 DIAGNOSIS — M79671 Pain in right foot: Secondary | ICD-10-CM

## 2016-11-17 MED ORDER — TRIAMCINOLONE ACETONIDE 10 MG/ML IJ SUSP
10.0000 mg | Freq: Once | INTRAMUSCULAR | Status: AC
  Administered 2016-11-17: 10 mg

## 2016-11-17 NOTE — Progress Notes (Signed)
Subjective:    Patient ID: [name], female   DOB: 61 y.o.   MRN: [id number]   HPI patient states she's had a one year history of pain in her left heel and did have several injection by her family physician that if not been successful and the pain is gradually getting worse. Patient states that she gets occasional discomfort also in the right arch but the left heel has been her big problem    Review of Systems  All other systems reviewed and are negative.       Objective:  Physical Exam  Constitutional: She appears well-developed and well-nourished.  Cardiovascular: Intact distal pulses.   Pulmonary/Chest: Effort normal.  Musculoskeletal: Normal range of motion.  Neurological: She is alert.  Skin: Skin is warm.  Nursing note and vitals reviewed.  neurovascular status intact muscle strength adequate range of motion within normal limits with patient noted to have exquisite discomfort in the left plantar fashion at the insertional point of the tendon into the calcaneus with fluid buildup noted. On the right arch is mild discomfort and patient does have sugar under good control and states she does not smoke currently    Assessment:    Acute plantar fasciitis left inflammation fluid around the medial band with mild arch pain right localized in nature     Plan:    H&P condition reviewed and I discussed that she is oriented had a lot of conservative treatment done. At this point were to completely immobilize try to get the pressure off and I did discuss at one point this may require surgical intervention or possible shockwave therapy and today injected the plantar fascial left 3 mg Kenalog 5 mill grams Xylocaine and went ahead and dispensed a air fracture walker to reduce all plantar pressure on the heel. Reappoint in the next 2 weeks to recheck  X-ray indicated pointed spur formation left plantar heel at the insertion calcaneus

## 2016-11-17 NOTE — Progress Notes (Signed)
   Subjective:    Patient ID: [name], female    DOB: Mar 02, 1955, 61 y.o.   MRN: [id number]  HPI    Review of Systems  All other systems reviewed and are negative.      Objective:   Physical Exam        Assessment & Plan:

## 2016-11-17 NOTE — Patient Instructions (Signed)

## 2016-11-21 ENCOUNTER — Telehealth: Payer: Self-pay

## 2016-11-21 NOTE — Telephone Encounter (Signed)
Patient called in and stated Robaxin  Is on back order patient requesting a new rx

## 2016-11-22 MED ORDER — CYCLOBENZAPRINE HCL 10 MG PO TABS: 10.0000 mg | ORAL_TABLET | Freq: Three times a day (TID) | ORAL | 0 refills | Status: DC | PRN

## 2016-11-22 NOTE — Telephone Encounter (Signed)
Dc robaxin and replace with flexeril 10 mg po q 8 hrs prn muscle spasm

## 2016-11-22 NOTE — Telephone Encounter (Signed)
Patient aware of new Rx  

## 2016-12-05 ENCOUNTER — Telehealth: Payer: Self-pay | Admitting: Family Medicine

## 2016-12-05 NOTE — Telephone Encounter (Signed)
Patient calling to get rx for her hydrocodone  [phone]

## 2016-12-05 NOTE — Telephone Encounter (Signed)
ok 

## 2016-12-05 NOTE — Telephone Encounter (Signed)
Ok to refill??      (not due til 12/11/16) 

## 2016-12-07 ENCOUNTER — Other Ambulatory Visit: Payer: Self-pay | Admitting: *Deleted

## 2016-12-07 DIAGNOSIS — I739 Peripheral vascular disease, unspecified: Secondary | ICD-10-CM

## 2016-12-07 MED ORDER — HYDROCODONE-ACETAMINOPHEN 5-325 MG PO TABS: 1.0000 | ORAL_TABLET | Freq: Four times a day (QID) | ORAL | 0 refills | Status: DC

## 2016-12-07 NOTE — Telephone Encounter (Signed)
RX printed, left up front and patient aware to pick up tomorrow viavm 

## 2016-12-10 ENCOUNTER — Other Ambulatory Visit: Payer: Self-pay | Admitting: Family Medicine

## 2016-12-13 ENCOUNTER — Other Ambulatory Visit: Payer: Self-pay | Admitting: Family Medicine

## 2016-12-13 DIAGNOSIS — K861 Other chronic pancreatitis: Secondary | ICD-10-CM

## 2016-12-15 ENCOUNTER — Encounter: Payer: Self-pay | Admitting: Podiatry

## 2016-12-15 ENCOUNTER — Ambulatory Visit (INDEPENDENT_AMBULATORY_CARE_PROVIDER_SITE_OTHER): Payer: BLUE CROSS/BLUE SHIELD | Admitting: Podiatry

## 2016-12-15 DIAGNOSIS — M722 Plantar fascial fibromatosis: Secondary | ICD-10-CM

## 2016-12-15 DIAGNOSIS — E1140 Type 2 diabetes mellitus with diabetic neuropathy, unspecified: Secondary | ICD-10-CM | POA: Diagnosis not present

## 2016-12-15 DIAGNOSIS — E1149 Type 2 diabetes mellitus with other diabetic neurological complication: Secondary | ICD-10-CM

## 2016-12-15 NOTE — Progress Notes (Signed)
Subjective:    Patient ID: [name], female   DOB: 61 y.o.   MRN: [id number]   HPI patient states that she is some improved from having had chronic plantar fasciitis left but still has pain and has not gone without her boot currently    ROS      Objective:  Physical Exam neurovascular status intact with diminishment of discomfort plantar heel left but still quite tender when pressed with inability to walk comfortably     Assessment:   Plantar fasciitis left still present      Plan:  Reviewed condition and recommended continued anti-inflammatories physical therapy and I went ahead today and I casted for customized orthotics to try to reduce plantar pressure against this area due to the long-term history and continued pain even with immobilization

## 2016-12-23 ENCOUNTER — Other Ambulatory Visit: Payer: Self-pay | Admitting: Cardiology

## 2016-12-26 ENCOUNTER — Other Ambulatory Visit: Payer: Self-pay | Admitting: Family Medicine

## 2016-12-28 ENCOUNTER — Other Ambulatory Visit: Payer: Self-pay | Admitting: Family Medicine

## 2016-12-28 MED ORDER — BISOPROLOL-HYDROCHLOROTHIAZIDE 2.5-6.25 MG PO TABS: 1.0000 | ORAL_TABLET | Freq: Every day | ORAL | 1 refills | Status: DC

## 2016-12-29 ENCOUNTER — Other Ambulatory Visit: Payer: Self-pay | Admitting: Family Medicine

## 2016-12-30 ENCOUNTER — Other Ambulatory Visit: Payer: Self-pay | Admitting: Family Medicine

## 2016-12-30 ENCOUNTER — Telehealth: Payer: Self-pay | Admitting: Podiatry

## 2016-12-30 NOTE — Telephone Encounter (Signed)
Pt needs refill on hydrocodone.  °

## 2016-12-30 NOTE — Telephone Encounter (Signed)
Ok to refill??   For 01/04/17 

## 2016-12-30 NOTE — Telephone Encounter (Signed)
I'm a pt of Dr. Beverlee Nimsegal's and I need to speak to the nurse about a problem I'm having with my foot. Number is [phone]. Thank you.

## 2016-12-30 NOTE — Telephone Encounter (Signed)
ok 

## 2016-12-30 NOTE — Telephone Encounter (Signed)
Pt states below the ankle and around is swollen and stiff, and the boot she is in hits the back of her heel and hurts. I offered pt an earlier appt and she states she thinks she can tough it out and take her pain medications. I told pt if she wanted to wait she would need to rest, ice 15 - 20 minutes 3-4 time day and elevate that foot and more her heel away from the back of the boot, but not off the end of the boot. I told pt to call if she had concerns.

## 2017-01-02 MED ORDER — HYDROCODONE-ACETAMINOPHEN 5-325 MG PO TABS: 1.0000 | ORAL_TABLET | Freq: Four times a day (QID) | ORAL | 0 refills | Status: DC

## 2017-01-02 NOTE — Telephone Encounter (Signed)
Prescription printed and patient made aware to come to office to pick up after 3pm on 01/02/2017. 

## 2017-01-08 ENCOUNTER — Other Ambulatory Visit: Payer: Self-pay | Admitting: Family Medicine

## 2017-01-08 DIAGNOSIS — K861 Other chronic pancreatitis: Secondary | ICD-10-CM

## 2017-01-09 IMAGING — NM NM BONE 3 PHASE
2 series · 12 of 12 positions shown · non-contrast
Comparison: Right knee radiographs 04/04/2013.

CLINICAL DATA: Right knee pain and swelling for 4 years. History of
right total knee arthroplasty in 8499. No recent injury. Initial
encounter.

EXAM:
NUCLEAR MEDICINE 3-PHASE BONE SCAN
TECHNIQUE: Radionuclide angiographic images, immediate static blood pool
images, and 3-hour delayed static images were obtained of the knees
after intravenous injection of radiopharmaceutical.
RADIOPHARMACEUTICALS:  24.0 mCi Technetium 99 MDP

[Series 1: fl flow and static · 4.75mm/px · 6 of 40 frames shown (1 of 2)]
[frame 4/40  full-range]
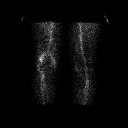
[frame 10/40  full-range]
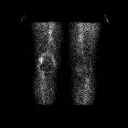
[frame 17/40  full-range]
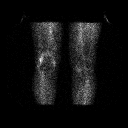
[frame 24/40  full-range]
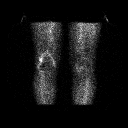
[frame 30/40  full-range]
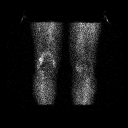
[frame 37/40  full-range]
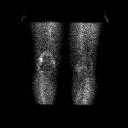

[Series 1: fl flow and static · 4.75mm/px · 6 of 40 frames shown (2 of 2)]
[frame 4/40  full-range]
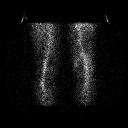
[frame 10/40  full-range]
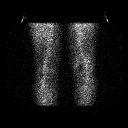
[frame 17/40  full-range]
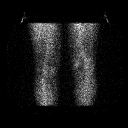
[frame 24/40  full-range]
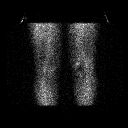
[frame 30/40  full-range]
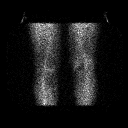
[frame 37/40  full-range]
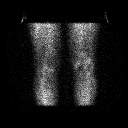

[12 of 12 positions shown; findings below may reference images not displayed]

FINDINGS: Vascular phase: There is mildly asymmetric increased perfusion on
the right with early synovial enhancement around the distal femur.

Blood pool phase: There is asymmetric blood pool activity
surrounding the distal right femur, likely associated with the
synovium.

Delayed phase: Delayed images demonstrate low-level activity within
the distal right femur and proximal tibia adjacent to the
prostheses. In the left knee, there is degenerative activity
medially.
IMPRESSION: 1. Asymmetric perfusion and blood flow activity in the right knee,
primarily around the distal femur, likely related to synovitis.
2. Mild nonspecific diffuse periprosthetic uptake on delayed
imaging, not specific for prosthetic loosening.
3. Left knee medial compartment degenerative changes.

## 2017-01-09 NOTE — Telephone Encounter (Signed)
Medication refilled per protocol. 

## 2017-01-12 ENCOUNTER — Encounter: Payer: Self-pay | Admitting: Podiatry

## 2017-01-12 ENCOUNTER — Ambulatory Visit: Payer: BLUE CROSS/BLUE SHIELD | Admitting: Podiatry

## 2017-01-12 DIAGNOSIS — M7661 Achilles tendinitis, right leg: Secondary | ICD-10-CM

## 2017-01-12 DIAGNOSIS — M722 Plantar fascial fibromatosis: Secondary | ICD-10-CM | POA: Diagnosis not present

## 2017-01-12 MED ORDER — TRIAMCINOLONE ACETONIDE 10 MG/ML IJ SUSP
10.0000 mg | Freq: Once | INTRAMUSCULAR | Status: AC
Start: 2017-01-12 — End: 2017-01-12
  Administered 2017-01-12: 10 mg

## 2017-01-12 MED ORDER — DICLOFENAC SODIUM 75 MG PO TBEC: 75.0000 mg | DELAYED_RELEASE_TABLET | Freq: Two times a day (BID) | ORAL | 2 refills | Status: DC

## 2017-01-12 NOTE — Progress Notes (Signed)
.  NRSTANDARDNOTE Subjective:   Patient ID: [name], female   DOB: 61 y.o.   MRN: [id number]   HPI Patient presents stating that her heel has been somewhat better on the bottom but she is having a lot of pain in the back of her heel and she thinks that she has been walking differently due to the pain.   ROS      Objective:  Physical Exam  Patient presents with inflammation pain in the posterior lateral of the left heel with moderate discomfort still noted in the plantar heel but patient also admits that she is not walking plantar on the heel due to the discomfort that has been present.     Assessment:  Inflammatory Achilles tendinitis left along with plantar fasciitis left secondary condition with possibility that the plantar fascia is the biggest problem for her condition.     Plan:  Reviewed the compensation within her tendon and the fact that this is contributory to her problem.  At this time I did do a careful injection of the posterior lateral aspect of the left heel explaining first chances for rupture before the injection was done and patient approved the injection 3 mg dexamethasone Kenalog 5 mg Xylocaine and went ahead and applied silicone sheeting to take pressure off the posterior heel.  Discussed that I want her to walk fully on her heel and ultimately this may still require fascial release.

## 2017-01-12 NOTE — Patient Instructions (Signed)

## 2017-01-18 ENCOUNTER — Other Ambulatory Visit: Payer: Self-pay | Admitting: Family Medicine

## 2017-01-26 ENCOUNTER — Encounter: Payer: Self-pay | Admitting: Podiatry

## 2017-01-26 ENCOUNTER — Ambulatory Visit (INDEPENDENT_AMBULATORY_CARE_PROVIDER_SITE_OTHER): Payer: BLUE CROSS/BLUE SHIELD | Admitting: Podiatry

## 2017-01-26 DIAGNOSIS — M722 Plantar fascial fibromatosis: Secondary | ICD-10-CM | POA: Diagnosis not present

## 2017-01-26 DIAGNOSIS — M7661 Achilles tendinitis, right leg: Secondary | ICD-10-CM

## 2017-01-26 NOTE — Progress Notes (Signed)
Subjective:   Patient ID: [name], female   DOB: 61 y.o.   MRN: [id number]   HPI Patient presents stating I am doing well as far as the bottom of my heel but I am having a lot of problems still in my Achilles tendon.  States that that continues to be sort of making it hard for her to walk comfortably.   ROS      Objective:  Physical Exam  Neurovascular status intact with patient's posterior heel still sore in the medial side with inflammation fluid in the plantar heel much improved from previous visit.  Achilles tendinitis with chronic fasciitis that continues to improve.  At this point I recommended physical therapy to try to resolve the posterior pain with continued orthotic usage and continued support therapy and supportive shoe gear.  Patient is scheduled for physical therapy to be done for the next 3 weeks and will be reevaluated again in 4 weeks     Assessment:  Acute Achilles tendinitis with plantar fasciitis     Plan:  Reviewed condition and recommended physical therapy and patient was scheduled with the physical therapist for a posterior Achilles tendon stretching probable iontophoresis and possible dry knee

## 2017-01-27 ENCOUNTER — Telehealth: Payer: Self-pay | Admitting: *Deleted

## 2017-01-27 DIAGNOSIS — M7661 Achilles tendinitis, right leg: Secondary | ICD-10-CM

## 2017-01-27 NOTE — Telephone Encounter (Signed)
Dr. Charlsie Merlesegal ordered PT for evaluation and treatment for right achilles tendonitis request to be scheduled for 01/30/2017.

## 2017-01-27 NOTE — Addendum Note (Signed)
Addended by: Alphia Kava'CONNELL, Junius Faucett D on: 01/27/2017 08:48 AM   Modules accepted: Orders

## 2017-01-30 ENCOUNTER — Telehealth: Payer: Self-pay | Admitting: Family Medicine

## 2017-01-30 NOTE — Telephone Encounter (Signed)
Ok to refill 

## 2017-01-30 NOTE — Telephone Encounter (Signed)
ok 

## 2017-01-30 NOTE — Telephone Encounter (Signed)
Patient calling to get rx for her hydrocodone [phone]

## 2017-01-31 ENCOUNTER — Other Ambulatory Visit: Payer: Self-pay | Admitting: Cardiovascular Disease

## 2017-01-31 MED ORDER — CLOPIDOGREL BISULFATE 75 MG PO TABS: 75.0000 mg | ORAL_TABLET | Freq: Every day | ORAL | 0 refills | Status: DC

## 2017-01-31 MED ORDER — HYDROCODONE-ACETAMINOPHEN 5-325 MG PO TABS: 1.0000 | ORAL_TABLET | Freq: Four times a day (QID) | ORAL | 0 refills | Status: DC

## 2017-01-31 NOTE — Telephone Encounter (Signed)
RX printed, left up front and patient aware to pick up via vm 

## 2017-02-02 ENCOUNTER — Other Ambulatory Visit: Payer: Self-pay | Admitting: Family Medicine

## 2017-02-05 ENCOUNTER — Other Ambulatory Visit: Payer: Self-pay | Admitting: Family Medicine

## 2017-02-07 ENCOUNTER — Other Ambulatory Visit: Payer: Self-pay | Admitting: Family Medicine

## 2017-02-07 DIAGNOSIS — K861 Other chronic pancreatitis: Secondary | ICD-10-CM

## 2017-02-13 ENCOUNTER — Other Ambulatory Visit: Payer: Self-pay | Admitting: Family Medicine

## 2017-02-13 NOTE — Telephone Encounter (Signed)
Ok to refill??  Last office visit 10/28/2016.  Last refill 12/12/2016, #1 refill.

## 2017-02-15 ENCOUNTER — Other Ambulatory Visit: Payer: Self-pay | Admitting: Family Medicine

## 2017-02-15 DIAGNOSIS — Z1231 Encounter for screening mammogram for malignant neoplasm of breast: Secondary | ICD-10-CM

## 2017-02-16 ENCOUNTER — Telehealth: Payer: Self-pay | Admitting: Cardiovascular Disease

## 2017-02-16 DIAGNOSIS — I739 Peripheral vascular disease, unspecified: Secondary | ICD-10-CM

## 2017-02-16 NOTE — Telephone Encounter (Signed)
Returned call to patient. She is due for yearly repeat of LEA dopplers & ABI. Order is in system. Aware I will send note to scheduler to arrange.   Pt requested this be set up on same day as her return OV w Dr. Kirke CorinArida (can move OV date if needed)  Please call patient.

## 2017-02-16 NOTE — Telephone Encounter (Signed)
New message  Pt verbalized that she is calling for the rn  She want a doppler done before seeing Dr.Arida

## 2017-02-20 ENCOUNTER — Other Ambulatory Visit: Payer: Self-pay | Admitting: Family Medicine

## 2017-02-23 ENCOUNTER — Encounter: Payer: Self-pay | Admitting: Podiatry

## 2017-02-23 ENCOUNTER — Ambulatory Visit: Payer: BLUE CROSS/BLUE SHIELD | Admitting: Podiatry

## 2017-02-23 DIAGNOSIS — M722 Plantar fascial fibromatosis: Secondary | ICD-10-CM | POA: Diagnosis not present

## 2017-02-23 DIAGNOSIS — M7661 Achilles tendinitis, right leg: Secondary | ICD-10-CM | POA: Diagnosis not present

## 2017-02-23 NOTE — Progress Notes (Signed)
Subjective:   Patient ID: [name], female   DOB: 62 y.o.   MRN: [id number]   HPI Patient presents stating the left foot is feeling quite a bit better but I still have a lot of pain in the back of the left ankle   ROS      Objective:  Physical Exam  Neurovascular status intact with patient's left Achilles tendon still sore but improved from previous visit     Assessment:  Achilles tendinitis left improved but present     Plan:  Reviewed condition and recommended one more physical therapy followed by home physical therapy aggressive ice heel left and I will see back in 4 weeks and may require more aggressive plan if symptoms were to persist

## 2017-02-27 ENCOUNTER — Other Ambulatory Visit: Payer: Self-pay | Admitting: Cardiovascular Disease

## 2017-02-27 ENCOUNTER — Other Ambulatory Visit: Payer: Self-pay | Admitting: Family Medicine

## 2017-02-27 MED ORDER — CLOPIDOGREL BISULFATE 75 MG PO TABS: 75.0000 mg | ORAL_TABLET | Freq: Every day | ORAL | 0 refills | Status: DC

## 2017-02-27 NOTE — Telephone Encounter (Signed)
Pt needs refill on hydrocodone sent to [facility].

## 2017-02-28 MED ORDER — HYDROCODONE-ACETAMINOPHEN 5-325 MG PO TABS: 1.0000 | ORAL_TABLET | Freq: Four times a day (QID) | ORAL | 0 refills | Status: DC

## 2017-02-28 NOTE — Telephone Encounter (Signed)
Patient is requesting a refill on Hydrocodone   LOV: 10/28/16 LRF:   01/31/17

## 2017-03-05 ENCOUNTER — Other Ambulatory Visit: Payer: Self-pay | Admitting: Family Medicine

## 2017-03-05 DIAGNOSIS — K861 Other chronic pancreatitis: Secondary | ICD-10-CM

## 2017-03-07 ENCOUNTER — Ambulatory Visit
Admission: RE | Admit: 2017-03-07 | Discharge: 2017-03-07 | Disposition: A | Discharge status: Home / Self Care | Payer: BLUE CROSS/BLUE SHIELD | Source: Ambulatory Visit | Attending: Family Medicine | Admitting: Family Medicine

## 2017-03-07 DIAGNOSIS — Z1231 Encounter for screening mammogram for malignant neoplasm of breast: Secondary | ICD-10-CM

## 2017-03-13 ENCOUNTER — Other Ambulatory Visit: Payer: Self-pay | Admitting: Family Medicine

## 2017-03-19 ENCOUNTER — Other Ambulatory Visit: Payer: Self-pay | Admitting: Family Medicine

## 2017-03-20 NOTE — Telephone Encounter (Signed)
Requesting refill    robaxin  LOV: 10/28/16  LRF:  02/13/17

## 2017-03-21 ENCOUNTER — Encounter: Payer: Self-pay | Admitting: Cardiovascular Disease

## 2017-03-21 ENCOUNTER — Ambulatory Visit (HOSPITAL_COMMUNITY)
Admission: RE | Admit: 2017-03-21 | Discharge: 2017-03-21 | Disposition: A | Discharge status: Home / Self Care | Payer: BLUE CROSS/BLUE SHIELD | Source: Ambulatory Visit | Attending: Cardiology | Admitting: Cardiology

## 2017-03-21 ENCOUNTER — Ambulatory Visit: Payer: BLUE CROSS/BLUE SHIELD | Admitting: Cardiovascular Disease

## 2017-03-21 VITALS — BP 110/77 | HR 84 | Ht 62.5 in | Wt 193.0 lb

## 2017-03-21 DIAGNOSIS — E1151 Type 2 diabetes mellitus with diabetic peripheral angiopathy without gangrene: Secondary | ICD-10-CM | POA: Diagnosis not present

## 2017-03-21 DIAGNOSIS — E785 Hyperlipidemia, unspecified: Secondary | ICD-10-CM

## 2017-03-21 DIAGNOSIS — I70201 Unspecified atherosclerosis of native arteries of extremities, right leg: Secondary | ICD-10-CM | POA: Insufficient documentation

## 2017-03-21 DIAGNOSIS — I2510 Atherosclerotic heart disease of native coronary artery without angina pectoris: Secondary | ICD-10-CM

## 2017-03-21 DIAGNOSIS — I739 Peripheral vascular disease, unspecified: Secondary | ICD-10-CM | POA: Diagnosis not present

## 2017-03-21 DIAGNOSIS — R9389 Abnormal findings on diagnostic imaging of other specified body structures: Secondary | ICD-10-CM | POA: Insufficient documentation

## 2017-03-21 DIAGNOSIS — R200 Anesthesia of skin: Secondary | ICD-10-CM | POA: Insufficient documentation

## 2017-03-21 NOTE — Patient Instructions (Signed)
Medication Instructions: Your physician recommends that you continue on your current medications as directed. Please refer to the Current Medication list given to you today.  If you need a refill on your cardiac medications before your next appointment, please call your pharmacy.    Procedures/Testing: Your physician has requested that you have a lower extremity arterial duplex in one year. During this test, ultrasound is used to evaluate arterial blood flow in the legs. Allow one hour for this exam. There are no restrictions or special instructions. This will take place at [address], [address]  Your physician has requested that you have an ankle brachial index (ABI) in one year. During this test an ultrasound and blood pressure cuff are used to evaluate the arteries that supply the arms and legs with blood. Allow thirty minutes for this exam. There are no restrictions or special instructions. This will take place at [address], [address]   Follow-Up: Your physician wants you to follow-up in 12 months with Dr. Kirke CorinArida after the tests have been completed. You will receive a reminder letter in the mail two months in advance. If you don't receive a letter, please call our office at [phone] to schedule this follow-up appointment.   Thank you for choosing [facility]!!

## 2017-03-21 NOTE — Progress Notes (Signed)
  Cardiology Office Note   Date:  03/21/2017   ID:  [name], DOB 01/15/1956, MRN [id number]  PCP:  Pickard, Warren T, MD  Cardiologist:  Arida  Chief Complaint  Patient presents with  . Follow-up    PAD      History of Present Illness: [name] is a 61 y.o. female who presents for a follow up visit regarding peripheral arterial disease and coronary artery disease. She has known history of peripheral arterial disease status post atherectomy of the right SFA in 2011 by Dr. McAlhaney. She has other chronic medical conditions including CAD, DM, HTN, and hyperlipidemia. She is a former smoker.  She is s/p bilateral TKR.   She had worsening right leg claudication in April 2017. Angiography showed no significant aortoiliac disease. There was moderate right common femoral artery stenosis and severe discrete stenosis in the proximal right SFA with three-vessel runoff below the knee. There was no significant obstructive disease involving the left lower extremity. A self-expanding stent was placed to the proximal right SFA. She had previous bilateral knee replacement and chronic bilateral leg pain.  She is also having issues with left plantar fasciitis.  She has no claudication.  She has occasional chest discomfort.  This is not exertional.  No significant shortness of breath.   Past Medical History:  Diagnosis Date  . Asthma with COPD (HCC)   . CAD (coronary artery disease)    a. LHC 2011: Diffuse distal and branch vessel disease - patient managed medically, no interventional options. b. Nuc 03/2014 - low risk, no ischemia.  . Complication of anesthesia    slow to wake up with last surgery in 2011   . Diabetes mellitus with circulatory complication (HCC)   . Dyslipidemia   . Headache    hx of migraines   . Hypertension   . Osteoarthritis    severe right knee  R TKR  . PAD (peripheral artery disease) (HCC)    a. Severe stenosis mid right SFA s/p atherectomy 03/25/09. b.  peripheral angiography in 12/2011 which showed only about 50% diffuse right SFA stenosis.  . Pancreatitis 2003  . Renal artery stenosis (HCC)    a. LHC 2011 - 40-50% left RAS.  . Tobacco use disorder    quit 11/10    Past Surgical History:  Procedure Laterality Date  . ABDOMINAL AORTAGRAM N/A 12/21/2011   Procedure: ABDOMINAL AORTAGRAM;  Surgeon: Muhammad A Arida, MD;  Location: [facility] CATH [facility];  Service: Cardiovascular;  Laterality: N/A;  . CARDIAC CATHETERIZATION    . LOWER EXTREMITY ANGIOGRAM Bilateral 05/27/2015   Procedure: Lower Extremity Angiogram;  Surgeon: Muhammad A Arida, MD;  Location: [facility] INVASIVE CV [facility];  Service: Cardiovascular;  Laterality: Bilateral;  . PERIPHERAL VASCULAR CATHETERIZATION N/A 05/27/2015   Procedure: Abdominal Aortogram;  Surgeon: Muhammad A Arida, MD;  Location: [facility] INVAS[facility] CV [facility];  Service: Cardiovascular;  Laterality: N/A;  . PERIPHERAL VASCULAR CATHETERIZATION Right 05/27/2015   Procedure: Peripheral Vascular Intervention;  Surgeon: Muhammad A Arida, MD;  Location: [facility] INVASIVE CV [facility];  Service: Cardiovascular;  Laterality: Right;  SFA  . TOTAL KNEE ARTHROPLASTY Right 2011  . TOTAL KNEE ARTHROPLASTY Left 05/22/2014   Procedure: LEFT TOTAL KNEE ARTHROPLASTY;  Surgeon: Ronald Gioffre, MD;  Location: [facility];  Service: Orthopedics;  Laterality: Left;  . TUBAL LIGATION  1980     Current Outpatient Medications  Medication Sig Dispense Refill  . ADVAIR DISKUS 250-50 MCG/DOSE AEPB INHALE 1 PUFF INTO THE LUNGS EVERY 12 HOURS   60 each 0  . aspirin 81 MG tablet Take 81 mg by mouth daily.      . Biotin w/ Vitamins C & E (HAIR/SKIN/NAILS PO) Take 1 tablet by mouth daily.    . bisoprolol-hydrochlorothiazide (ZIAC) 2.5-6.25 MG tablet TAKE 1 TABLET BY MOUTH DAILY 30 tablet 2  . Blood Glucose Monitoring Suppl (BAYER CONTOUR MONITOR) w/Device KIT CHECK BLOOD SUGAR EVERY DAY 1 kit 0  . clopidogrel (PLAVIX) 75 MG tablet Take 1 tablet (75 mg total) by mouth daily. Please keep  upcoming appt for future refills. Thank you 30 tablet 0  . CONTOUR NEXT TEST test strip CHECK BLOOD SUGAR EVERY DAY 100 each 5  . diclofenac (VOLTAREN) 75 MG EC tablet Take 1 tablet (75 mg total) by mouth 2 (two) times daily. 50 tablet 2  . diclofenac sodium (VOLTAREN) 1 % GEL Apply 2 g topically 4 (four) times daily. 100 g 0  . diphenhydrAMINE (BENADRYL) 25 mg capsule Take 25 mg by mouth every 6 (six) hours as needed for allergies.    . fish oil-omega-3 fatty acids 1000 MG capsule Take 2 capsules (2 g total) by mouth 2 (two) times daily. 360 capsule 3  . glimepiride (AMARYL) 4 MG tablet TAKE 1 TABLET BY MOUTH EVERY DAY WITH LUNCH 90 tablet 3  . GuaiFENesin (MUCUS RELIEF PO) Take 10 mLs by mouth as needed (for congestion).    Marland Kitchen HYDROcodone-acetaminophen (NORCO/VICODIN) 5-325 MG tablet Take 1 tablet by mouth 4 (four) times daily. 120 tablet 0  . isosorbide mononitrate (IMDUR) 60 MG 24 hr tablet TAKE 1 TABLET BY MOUTH DAILY 90 tablet 2  . lisinopril (PRINIVIL,ZESTRIL) 10 MG tablet TAKE 1 TABLET BY MOUTH DAILY 90 tablet 2  . metFORMIN (GLUCOPHAGE) 1000 MG tablet TAKE 1 TABLET BY MOUTH TWICE DAILY WITH MEALS 180 tablet 0  . methocarbamol (ROBAXIN) 500 MG tablet TAKE 1 TABLET BY MOUTH EVERY 6 HOURS AS NEEDED FOR MUSCLE SPASMS 120 tablet 0  . Multiple Vitamin (MULTIVITAMIN) tablet Take 1 tablet by mouth daily.      . ondansetron (ZOFRAN) 4 MG tablet TAKE 1 TABLET(4 MG) BY MOUTH EVERY 8 HOURS AS NEEDED FOR NAUSEA OR VOMITING 30 tablet 0  . pantoprazole (PROTONIX) 40 MG tablet TAKE 1 TABLET BY MOUTH DAILY 30 tablet 11  . pioglitazone (ACTOS) 30 MG tablet TAKE 1 TABLET(30 MG) BY MOUTH DAILY 30 tablet 0  . pioglitazone (ACTOS) 30 MG tablet TAKE 1 TABLET(30 MG) BY MOUTH DAILY 30 tablet 3  . Probiotic Product (TRUBIOTICS PO) Take 1 capsule by mouth daily.    . rosuvastatin (CRESTOR) 40 MG tablet TAKE 1 TABLET BY MOUTH DAILY 30 tablet 0  . VENTOLIN HFA 108 (90 Base) MCG/ACT inhaler INHALE 2 PUFFS BY MOUTH  TWICE DAILY AS NEEDED FOR WHEEZING OR SHORTNESS OF BREATH 54 g 3   No current facility-administered medications for this visit.     Allergies:   Gabapentin; Glucotrol [glipizide]; Influenza vaccines; Latex; Milk-related compounds; Nortriptyline; Tetanus toxoid; and Invokana [canagliflozin]    Social History:  The patient  reports that she quit smoking about 8 years ago. she has never used smokeless tobacco. She reports that she does not drink alcohol or use drugs.   Family History:  The patient's family history includes Breast cancer (age of onset: 67) in her sister; CVA in her mother; Emphysema in her father.    ROS:  Please see the history of present illness.   Otherwise, review of systems are positive for .  All other systems are reviewed and negative.    PHYSICAL EXAM: VS:  BP 110/77   Pulse 84   Ht 5' 2.5" (1.588 m)   Wt 193 lb (87.5 kg)   BMI 34.74 kg/m  , BMI Body mass index is 34.74 kg/m. GEN: Well nourished, well developed, in no acute distress  HEENT: normal  Neck: no JVD, carotid bruits, or masses Cardiac: RRR; no murmurs, rubs, or gallops,no edema  Respiratory:  clear to auscultation bilaterally, normal work of breathing GI: soft, nontender, nondistended, + BS MS: no deformity or atrophy  Skin: warm and dry, no rash Neuro:  Strength and sensation are intact Psych: euthymic mood, full affect Vascular :  Femoral pulse is mildly diminished bilaterally.   EKG:  EKG is not ordered today.    Recent Labs: No results found for requested labs within last 8760 hours.    Lipid Panel    Component Value Date/Time   CHOL 167 11/24/2015 0826   TRIG 177 (H) 11/24/2015 0826   HDL 56 11/24/2015 0826   CHOLHDL 3.0 11/24/2015 0826   VLDL 35 (H) 11/24/2015 0826   LDLCALC 76 11/24/2015 0826   LDLDIRECT 50.6 01/02/2014 1003      Wt Readings from Last 3 Encounters:  03/21/17 193 lb (87.5 kg)  10/28/16 205 lb (93 kg)  07/06/16 201 lb (91.2 kg)       ASSESSMENT AND  PLAN:  1.  PAD: Status post recent stent placement to the proximal right SFA. No significant claudication. Noninvasive vascular evaluation today showed normal left ABI and mildly reduced on the right side at 0.88. The proximal right SFA stent was patent with moderate diffuse disease affecting the right mid SFA which is a known finding. This was reviewed with there today.  I recommend continuing medical therapy and repeat studies in 1 year.  2. Coronary artery disease involving native coronary arteries without angina:  Previous cardiac catheterization in 2011 showed distal LAD and small branch disease which is being managed medically.  3. Hyperlipidemia:  Continue treatment with high-dose rosuvastatin.  Recommend a target LDL of less than 70.  4. Diabetes mellitus: Managed by primary care physician.  Disposition:   FU with me in 12 months  Signed,  Muhammad Arida, MD  03/21/2017 10:20 AM    [facility] 

## 2017-03-23 ENCOUNTER — Ambulatory Visit: Payer: BLUE CROSS/BLUE SHIELD | Admitting: Podiatry

## 2017-03-23 ENCOUNTER — Encounter: Payer: Self-pay | Admitting: Podiatry

## 2017-03-23 DIAGNOSIS — M722 Plantar fascial fibromatosis: Secondary | ICD-10-CM

## 2017-03-23 DIAGNOSIS — M7661 Achilles tendinitis, right leg: Secondary | ICD-10-CM

## 2017-03-23 MED ORDER — TRIAMCINOLONE ACETONIDE 10 MG/ML IJ SUSP
10.0000 mg | Freq: Once | INTRAMUSCULAR | Status: AC
  Administered 2017-03-23: 10 mg

## 2017-03-23 NOTE — Progress Notes (Signed)
Subjective:   Patient ID: [name], female   DOB: 62 y.o.   MRN: [id number]   HPI Patient presents stating the left posterior heel seems to be improving but she is developed a lot of pain in her mid arch area left that is inflamed with fluid buildup noted   ROS      Objective:  Physical Exam  Neurovascular status intact with inflammation pain of the left mid arch area that is also related to depression of the arch with significant improvement of the Achilles tendon left     Assessment:  Mid arch plantar fasciitis left with fluid buildup     Plan:  Continue physical therapy for the Achilles tendon today I injected the mid arch plantar fascia 3 mg Kenalog 5 mm Xylocaine and applied fascial brace to lift the arch and explained on ice therapy.  Reappoint to recheck

## 2017-03-27 ENCOUNTER — Other Ambulatory Visit: Payer: Self-pay | Admitting: Family Medicine

## 2017-03-27 NOTE — Telephone Encounter (Signed)
Patient is requesting a refill on Hydrocodone   LOV: 10/28/16  LRF:   02/28/17

## 2017-03-28 MED ORDER — HYDROCODONE-ACETAMINOPHEN 5-325 MG PO TABS: 1.0000 | ORAL_TABLET | Freq: Four times a day (QID) | ORAL | 0 refills | Status: DC

## 2017-04-03 ENCOUNTER — Other Ambulatory Visit: Payer: Self-pay | Admitting: Family Medicine

## 2017-04-03 DIAGNOSIS — K861 Other chronic pancreatitis: Secondary | ICD-10-CM

## 2017-04-06 ENCOUNTER — Other Ambulatory Visit: Payer: Self-pay

## 2017-04-06 ENCOUNTER — Encounter: Payer: Self-pay | Admitting: Family Medicine

## 2017-04-06 ENCOUNTER — Ambulatory Visit (INDEPENDENT_AMBULATORY_CARE_PROVIDER_SITE_OTHER): Payer: BLUE CROSS/BLUE SHIELD | Admitting: Family Medicine

## 2017-04-06 ENCOUNTER — Other Ambulatory Visit: Payer: Self-pay | Admitting: *Deleted

## 2017-04-06 VITALS — BP 120/60 | HR 82 | Temp 97.7°F | Resp 16 | Ht 62.5 in | Wt 195.0 lb

## 2017-04-06 DIAGNOSIS — Z23 Encounter for immunization: Secondary | ICD-10-CM

## 2017-04-06 DIAGNOSIS — I2510 Atherosclerotic heart disease of native coronary artery without angina pectoris: Secondary | ICD-10-CM

## 2017-04-06 DIAGNOSIS — I10 Essential (primary) hypertension: Secondary | ICD-10-CM

## 2017-04-06 DIAGNOSIS — E119 Type 2 diabetes mellitus without complications: Secondary | ICD-10-CM

## 2017-04-06 DIAGNOSIS — Z0000 Encounter for general adult medical examination without abnormal findings: Secondary | ICD-10-CM | POA: Diagnosis not present

## 2017-04-06 DIAGNOSIS — I701 Atherosclerosis of renal artery: Secondary | ICD-10-CM

## 2017-04-06 MED ORDER — CLOPIDOGREL BISULFATE 75 MG PO TABS: 75.0000 mg | ORAL_TABLET | Freq: Every day | ORAL | 11 refills | Status: DC

## 2017-04-06 NOTE — Addendum Note (Signed)
Addended by: Legrand RamsWILLIS, Lafonda Patron B on: 04/06/2017 11:18 AM   Modules accepted: Orders

## 2017-04-06 NOTE — Progress Notes (Signed)
Subjective:    Patient ID: [name], female    DOB: 11/08/1955, 62 y.o.   MRN: [id number]  Medication Refill    Here for CPE. She has a very complicated past medical history including coronary artery disease and peripheral artery disease. She is currently managed medically. Patient specifically states that her arteries are not amenable to percutaneous coronary intervention. She is on aspirin and Plavix, Imdur, a statin, beta blocker, and ACE inhibitor. She also has a chronic history of asthma that she has had since childhood. This is complicated by long-standing tobacco abuse. She states that she quit smoking several years ago. Her history states that she has COPD but she is adamant that it is asthma. She is currently managed with Advair twice a day and albuterol as needed. She uses albuterol 1-2 times per week. She also has long-standing history of episodes of pancreatitis. Patient states that she quit drinking approximately 20 years ago. She states that she was not a heavy drinker but that she did indulge in alcohol at that time. That is when the pancreatitis began. Ever since she has episodes of pancreatitis. The patient is not sure what triggers her pancreatitis. Patient was scheduled for a HIDA scan but that got canceled for some reason. At the present time she treats episodes of pancreatitis with bowel rest and pain medication. She also had a right knee replacement. She has chronic pain in both knees. Her orthopedist is scheduling her for surgery to evaluate her right knee replacement and also perform a left total knee replacement. She has never had a colonoscopy.   Her mammogram was just performed and was normal.  She is due for a booster on Pneumovax 23.  She is due for a flu shot which she adamantly refuses.  She is due for the shingles vaccine.  She is overdue for a Pap smear.  She is overdue for colonoscopy which she adamantly refuses.  She never performed the cologuard I gave her  recently.  She is due for diabetic eye exam Past Medical History:  Diagnosis Date  . Asthma with COPD ([place])   . CAD (coronary artery disease)    a. [place] 2011: Diffuse distal and branch vessel disease - patient managed medically, no interventional options. b. [place] 03/2014 - low risk, no ischemia.  . Complication of anesthesia    slow to wake up with last surgery in 2011   . Diabetes mellitus with circulatory complication ([place])   . Dyslipidemia   . Headache    hx of migraines   . Hypertension   . Osteoarthritis    severe right knee  R TKR  . PAD (peripheral artery disease) ([place])    a. Severe stenosis mid right SFA s/p atherectomy 03/25/09. b. peripheral angiography in 12/2011 which showed only about 50% diffuse right SFA stenosis.  . Pancreatitis 2003  . Renal artery stenosis ([place])    a. [place] 2011 - 40-50% left RAS.  . Tobacco use disorder    quit 11/10   Past Surgical History:  Procedure Laterality Date  . ABDOMINAL AORTAGRAM N/A 12/21/2011   Procedure: ABDOMINAL Maxcine Ham;  Surgeon: Wellington Hampshire, MD;  Location: [facility];  Service: Cardiovascular;  Laterality: N/A;  . CARDIAC CATHETERIZATION    . LOWER EXTREMITY ANGIOGRAM Bilateral 05/27/2015   Procedure: Lower Extremity Angiogram;  Surgeon: Wellington Hampshire, MD;  Location: [facility];  Service: Cardiovascular;  Laterality: Bilateral;  . PERIPHERAL VASCULAR CATHETERIZATION N/A 05/27/2015   Procedure:  Abdominal Aortogram;  Surgeon: Wellington Hampshire, MD;  Location: [facility];  Service: Cardiovascular;  Laterality: N/A;  . PERIPHERAL VASCULAR CATHETERIZATION Right 05/27/2015   Procedure: Peripheral Vascular Intervention;  Surgeon: Wellington Hampshire, MD;  Location: [facility];  Service: Cardiovascular;  Laterality: Right;  SFA  . TOTAL KNEE ARTHROPLASTY Right 2011  . TOTAL KNEE ARTHROPLASTY Left 05/22/2014   Procedure: LEFT TOTAL KNEE ARTHROPLASTY;  Surgeon: Latanya Maudlin, MD;  Location: [facility];  Service:  Orthopedics;  Laterality: Left;  . TUBAL LIGATION  1980   Current Outpatient Medications on File Prior to Visit  Medication Sig Dispense Refill  . ADVAIR DISKUS 250-50 MCG/DOSE AEPB INHALE 1 PUFF INTO THE LUNGS EVERY 12 HOURS 60 each 0  . aspirin 81 MG tablet Take 81 mg by mouth daily.      . Biotin w/ Vitamins C & E (HAIR/SKIN/NAILS PO) Take 1 tablet by mouth daily.    . bisoprolol-hydrochlorothiazide (ZIAC) 2.5-6.25 MG tablet TAKE 1 TABLET BY MOUTH DAILY 30 tablet 2  . Blood Glucose Monitoring Suppl (BAYER CONTOUR MONITOR) w/Device KIT CHECK BLOOD SUGAR EVERY DAY 1 kit 0  . clopidogrel (PLAVIX) 75 MG tablet Take 1 tablet (75 mg total) by mouth daily. Please keep upcoming appt for future refills. Thank you 30 tablet 0  . CONTOUR NEXT TEST test strip CHECK BLOOD SUGAR EVERY DAY 100 each 5  . diclofenac (VOLTAREN) 75 MG EC tablet Take 1 tablet (75 mg total) by mouth 2 (two) times daily. 50 tablet 2  . diclofenac sodium (VOLTAREN) 1 % GEL Apply 2 g topically 4 (four) times daily. 100 g 0  . diphenhydrAMINE (BENADRYL) 25 mg capsule Take 25 mg by mouth every 6 (six) hours as needed for allergies.    . fish oil-omega-3 fatty acids 1000 MG capsule Take 2 capsules (2 g total) by mouth 2 (two) times daily. 360 capsule 3  . glimepiride (AMARYL) 4 MG tablet TAKE 1 TABLET BY MOUTH EVERY DAY WITH LUNCH 90 tablet 3  . GuaiFENesin (MUCUS RELIEF PO) Take 10 mLs by mouth as needed (for congestion).    Marland Kitchen HYDROcodone-acetaminophen (NORCO/VICODIN) 5-325 MG tablet Take 1 tablet by mouth 4 (four) times daily. 120 tablet 0  . isosorbide mononitrate (IMDUR) 60 MG 24 hr tablet TAKE 1 TABLET BY MOUTH DAILY 90 tablet 2  . lisinopril (PRINIVIL,ZESTRIL) 10 MG tablet TAKE 1 TABLET BY MOUTH DAILY 90 tablet 2  . metFORMIN (GLUCOPHAGE) 1000 MG tablet TAKE 1 TABLET BY MOUTH TWICE DAILY WITH MEALS 180 tablet 0  . methocarbamol (ROBAXIN) 500 MG tablet TAKE 1 TABLET BY MOUTH EVERY 6 HOURS AS NEEDED FOR MUSCLE SPASMS 120 tablet 0    . Multiple Vitamin (MULTIVITAMIN) tablet Take 1 tablet by mouth daily.      . ondansetron (ZOFRAN) 4 MG tablet TAKE 1 TABLET(4 MG) BY MOUTH EVERY 8 HOURS AS NEEDED FOR NAUSEA OR VOMITING 30 tablet 0  . pantoprazole (PROTONIX) 40 MG tablet TAKE 1 TABLET BY MOUTH DAILY 90 tablet 3  . pioglitazone (ACTOS) 30 MG tablet TAKE 1 TABLET(30 MG) BY MOUTH DAILY 30 tablet 0  . pioglitazone (ACTOS) 30 MG tablet TAKE 1 TABLET(30 MG) BY MOUTH DAILY 30 tablet 3  . Probiotic Product (TRUBIOTICS PO) Take 1 capsule by mouth daily.    . rosuvastatin (CRESTOR) 40 MG tablet TAKE 1 TABLET BY MOUTH DAILY 30 tablet 0  . VENTOLIN HFA 108 (90 Base) MCG/ACT inhaler INHALE 2 PUFFS BY MOUTH TWICE  DAILY AS NEEDED FOR WHEEZING OR SHORTNESS OF BREATH 54 g 3   No current facility-administered medications on file prior to visit.    Allergies  Allergen Reactions  . Gabapentin Other (See Comments)    Brain shakes  . Glucotrol [Glipizide] Rash and Other (See Comments)    Red rash  . Influenza Vaccines Other (See Comments)    Significant arm soreness requiring 1 year of physical therapy  . Latex Rash  . Milk-Related Compounds Rash and Other (See Comments)    Fever  . Nortriptyline Other (See Comments)    BRAIN SHAKE  . Tetanus Toxoid Hives, Swelling, Rash and Other (See Comments)    Swelling to site of injection with rash and fever  . Invokana [Canagliflozin] Other (See Comments)    Yeast Infections   Social History   Socioeconomic History  . Marital status: Married    Spouse name: Not on file  . Number of children: Not on file  . Years of education: 64  . Highest education level: Not on file  Social Needs  . Financial resource strain: Not on file  . Food insecurity - worry: Not on file  . Food insecurity - inability: Not on file  . Transportation needs - medical: Not on file  . Transportation needs - non-medical: Not on file  Occupational History  . Not on file  Tobacco Use  . Smoking status: Former Smoker     Last attempt to quit: 12/13/2008    Years since quitting: 8.3  . Smokeless tobacco: Never Used  Substance and Sexual Activity  . Alcohol use: No  . Drug use: No  . Sexual activity: Not on file  Other Topics Concern  . Not on file  Social History Narrative   Denies any IV drug use or marijuana use.  Former smoker for 30 years, quit Nov. 2010. She is married with 2 children. Disabled from knee, not working.   Family History  Problem Relation Age of Onset  . CVA Mother   . Emphysema Father   . Breast cancer Sister 3     Review of Systems  All other systems reviewed and are negative.      Objective:   Physical Exam  Constitutional: She is oriented to person, place, and time. She appears well-developed and well-nourished. No distress.  HENT:  Right Ear: External ear normal.  Left Ear: External ear normal.  Nose: Nose normal.  Mouth/Throat: Oropharynx is clear and moist.  Eyes: Conjunctivae are normal.  Neck: Neck supple. No JVD present. Thyromegaly present.  Cardiovascular: Normal rate, regular rhythm and normal heart sounds.  No murmur heard. Pulmonary/Chest: Effort normal and breath sounds normal. She has no wheezes. She has no rales. She exhibits no tenderness.  Abdominal: Soft. Bowel sounds are normal. She exhibits no distension and no mass. There is no tenderness. There is no rebound and no guarding.  Musculoskeletal: She exhibits no edema.  Lymphadenopathy:    She has no cervical adenopathy.  Neurological: She is alert and oriented to person, place, and time. She has normal reflexes. No cranial nerve deficit. She exhibits normal muscle tone. Coordination normal.  Skin: No rash noted. She is not diaphoretic. No erythema.  Vitals reviewed.  Patient has no neurologic deficit in her feet. She does have thick calluses on the lateral aspects of the MTP joints bilaterally. She has diminished pulses in the right foot checked at the dorsalis pedis and posterior  tibialis       Assessment &  Plan:   General medical exam  Type 2 diabetes mellitus without complication, without long-term current use of insulin ([place]) - Plan: CBC with Differential/Platelet, COMPLETE METABOLIC PANEL WITH GFR, Lipid panel, Microalbumin, urine, Hemoglobin A1c  Benign essential HTN  Renal artery stenosis ([facility])  Coronary artery disease involving native heart without angina pectoris, unspecified vessel or lesion type - Plan: CBC with Differential/Platelet, COMPLETE METABOLIC PANEL WITH GFR, Lipid panel, Microalbumin, urine, Hemoglobin A1c Her blood pressures is excellent.  Mammogram is up-to-date.  Recommended a flu shot but she refuses.  Recommended a booster on Pneumovax 23 which she agrees to.  Recommended the shingles vaccine but she will check on the price first.  Recommended a diabetic eye exam and she will let me schedule.  Recommended cologuard and she agrees to allow me to schedule it.  Recommended a Pap smear but she defers this to a female provider.  She states that she will schedule with this with [place] later.  Check CBC, CMP, fasting lipid panel, urine microalbumin, hemoglobin A1c.  Goal hemoglobin A1c is less than 6.5.  Goal LDL cholesterol is less than 70.

## 2017-04-07 LAB — HEMOGLOBIN A1C
Hgb A1c MFr Bld: 6.2 % of total Hgb — ABNORMAL HIGH (ref <5.7)
Mean Plasma Glucose: 131 (calc)
eAG (mmol/L): 7.3 (calc)

## 2017-04-07 LAB — CBC WITH DIFFERENTIAL/PLATELET
BASOS PCT: 1 %
Basophils Absolute: 58 cells/uL (ref 0–200)
EOS ABS: 249 {cells}/uL (ref 15–500)
Eosinophils Relative: 4.3 %
HCT: 37.2 % (ref 35.0–45.0)
Hemoglobin: 12.2 g/dL (ref 11.7–15.5)
Lymphs Abs: 1728 cells/uL (ref 850–3900)
MCH: 28.9 pg (ref 27.0–33.0)
MCHC: 32.8 g/dL (ref 32.0–36.0)
MCV: 88.2 fL (ref 80.0–100.0)
MPV: 9.1 fL (ref 7.5–12.5)
Monocytes Relative: 8.1 %
NEUTROS ABS: 3294 {cells}/uL (ref 1500–7800)
Neutrophils Relative %: 56.8 %
Platelets: 302 10*3/uL (ref 140–400)
RBC: 4.22 10*6/uL (ref 3.80–5.10)
RDW: 13.1 % (ref 11.0–15.0)
Total Lymphocyte: 29.8 %
WBC: 5.8 10*3/uL (ref 3.8–10.8)
WBCMIX: 470 {cells}/uL (ref 200–950)

## 2017-04-07 LAB — LIPID PANEL
Cholesterol: 149 mg/dL (ref <200)
HDL: 53 mg/dL (ref >50)
LDL CHOLESTEROL (CALC): 71 mg/dL
Non-HDL Cholesterol (Calc): 96 mg/dL (calc) (ref <130)
Total CHOL/HDL Ratio: 2.8 (calc) (ref <5.0)
Triglycerides: 167 mg/dL — ABNORMAL HIGH (ref <150)

## 2017-04-07 LAB — COMPLETE METABOLIC PANEL WITH GFR
AG Ratio: 2.3 (calc) (ref 1.0–2.5)
ALBUMIN MSPROF: 4.5 g/dL (ref 3.6–5.1)
ALT: 19 U/L (ref 6–29)
AST: 19 U/L (ref 10–35)
Alkaline phosphatase (APISO): 75 U/L (ref 33–130)
BILIRUBIN TOTAL: 0.2 mg/dL (ref 0.2–1.2)
BUN / CREAT RATIO: 24 (calc) — AB (ref 6–22)
BUN: 30 mg/dL — AB (ref 7–25)
CHLORIDE: 104 mmol/L (ref 98–110)
CO2: 26 mmol/L (ref 20–32)
Calcium: 10.4 mg/dL (ref 8.6–10.4)
Creat: 1.23 mg/dL — ABNORMAL HIGH (ref 0.50–0.99)
GFR, EST AFRICAN AMERICAN: 55 mL/min/{1.73_m2} — AB (ref >=60)
GFR, Est Non African American: 48 mL/min/{1.73_m2} — ABNORMAL LOW (ref >=60)
GLOBULIN: 2 g/dL (ref 1.9–3.7)
Glucose, Bld: 63 mg/dL — ABNORMAL LOW (ref 65–99)
POTASSIUM: 4.9 mmol/L (ref 3.5–5.3)
Sodium: 141 mmol/L (ref 135–146)
TOTAL PROTEIN: 6.5 g/dL (ref 6.1–8.1)

## 2017-04-07 LAB — MICROALBUMIN, URINE: MICROALB UR: 6.8 mg/dL

## 2017-04-10 ENCOUNTER — Other Ambulatory Visit: Payer: Self-pay | Admitting: *Deleted

## 2017-04-10 ENCOUNTER — Encounter: Payer: Self-pay | Admitting: Family Medicine

## 2017-04-13 ENCOUNTER — Other Ambulatory Visit: Payer: Self-pay | Admitting: Family Medicine

## 2017-04-19 ENCOUNTER — Other Ambulatory Visit: Payer: Self-pay | Admitting: Family Medicine

## 2017-04-19 NOTE — Telephone Encounter (Signed)
Not due til 04/25/17 

## 2017-04-19 NOTE — Telephone Encounter (Signed)
Refill on hydrocodone to [facility]

## 2017-04-21 NOTE — Telephone Encounter (Signed)
Patient is requesting a refill on Hydrocodone   LOV: 04/06/17  LRF: 03/28/17

## 2017-04-22 ENCOUNTER — Other Ambulatory Visit: Payer: Self-pay | Admitting: Family Medicine

## 2017-04-22 DIAGNOSIS — E119 Type 2 diabetes mellitus without complications: Secondary | ICD-10-CM

## 2017-04-24 MED ORDER — HYDROCODONE-ACETAMINOPHEN 5-325 MG PO TABS: 1.0000 | ORAL_TABLET | Freq: Four times a day (QID) | ORAL | 0 refills | Status: DC

## 2017-04-27 LAB — HM DIABETES EYE EXAM

## 2017-04-28 ENCOUNTER — Other Ambulatory Visit: Payer: Self-pay | Admitting: Family Medicine

## 2017-04-28 DIAGNOSIS — K861 Other chronic pancreatitis: Secondary | ICD-10-CM

## 2017-05-09 LAB — COLOGUARD: Cologuard: NEGATIVE

## 2017-05-17 ENCOUNTER — Other Ambulatory Visit: Payer: Self-pay | Admitting: Family Medicine

## 2017-05-17 NOTE — Telephone Encounter (Signed)
Ok to refill 

## 2017-05-22 ENCOUNTER — Other Ambulatory Visit: Payer: Self-pay | Admitting: Family Medicine

## 2017-05-22 NOTE — Telephone Encounter (Signed)
Refill on hydrocodone to [facility]

## 2017-05-23 ENCOUNTER — Encounter: Payer: Self-pay | Admitting: *Deleted

## 2017-05-23 MED ORDER — HYDROCODONE-ACETAMINOPHEN 5-325 MG PO TABS: 1.0000 | ORAL_TABLET | Freq: Four times a day (QID) | ORAL | 0 refills | Status: DC

## 2017-05-23 NOTE — Telephone Encounter (Signed)
Patient is requesting a refill on Hydrocodone   LOV: 04/06/17  LRF:  04/24/17

## 2017-05-24 ENCOUNTER — Other Ambulatory Visit: Payer: Self-pay | Admitting: Family Medicine

## 2017-05-24 DIAGNOSIS — K861 Other chronic pancreatitis: Secondary | ICD-10-CM

## 2017-05-26 ENCOUNTER — Ambulatory Visit: Payer: BLUE CROSS/BLUE SHIELD | Admitting: Podiatry

## 2017-05-26 ENCOUNTER — Encounter: Payer: Self-pay | Admitting: Podiatry

## 2017-05-26 DIAGNOSIS — M722 Plantar fascial fibromatosis: Secondary | ICD-10-CM | POA: Diagnosis not present

## 2017-05-26 NOTE — Progress Notes (Signed)
Subjective:   Patient ID: [name], female   DOB: 62 y.o.   MRN: [id number]   HPI Patient states the left heel has still been very sore and the injections are no longer helping her   ROS      Objective:  Physical Exam  Neurovascular status intact muscle strength continues to be adequate patient having significant discomfort in the plantar fascia of the left heel.  Patient had a history of stent in the right leg but has never had any circulatory issue on the left and she does take Plavix currently.  Patient has had bilateral knee replacements the last one in 2016 and it healed fine with no issues     Assessment:  Chronic plantar fasciitis left heel     Plan:  Reviewed condition at great length and discussed her diabetes is under very good control currently and at this point I do think very simple endoscopic release of the fascia would hopefully give her complete relief and allow her to be more active.  I did explain that I want her to be weightbearing on this procedure immediately and that it only should take about 20 minutes to do with low risk.  Patient wants surgery and I allowed her to read consent form going over at great length all possible complications and patient is scheduled for outpatient surgery at this time understanding that total recovery can take 6 months to 1 year and she will walk on it right away and should hopefully be out of her boot within several weeks

## 2017-05-26 NOTE — Patient Instructions (Signed)
Pre-Operative Instructions  Congratulations, you have decided to take an important step towards improving your quality of life.  You can be assured that the doctors and staff at [facility] will be with you every step of the way.  Here are some important things you should know:  1. Plan to be at the surgery center/hospital at least 1 (one) hour prior to your scheduled time, unless otherwise directed by the surgical center/hospital staff.  You must have a responsible adult accompany you, remain during the surgery and drive you home.  Make sure you have directions to the surgical center/hospital to ensure you arrive on time. 2. If you are having surgery at [facility] or  hospitals, you will need a copy of your medical history and physical form from your family physician within one month prior to the date of surgery. We will give you a form for your primary physician to complete.  3. We make every effort to accommodate the date you request for surgery.  However, there are times where surgery dates or times have to be moved.  We will contact you as soon as possible if a change in schedule is required.   4. No aspirin/ibuprofen for one week before surgery.  If you are on aspirin, any non-steroidal anti-inflammatory medications (Mobic, Aleve, Ibuprofen) should not be taken seven (7) days prior to your surgery.  You make take Tylenol for pain prior to surgery.  5. Medications - If you are taking daily heart and blood pressure medications, seizure, reflux, allergy, asthma, anxiety, pain or diabetes medications, make sure you notify the surgery center/hospital before the day of surgery so they can tell you which medications you should take or avoid the day of surgery. 6. No food or drink after midnight the night before surgery unless directed otherwise by surgical center/hospital staff. 7. No alcoholic beverages 24-hours prior to surgery.  No smoking 24-hours prior or 24-hours after  surgery. 8. Wear loose pants or shorts. They should be loose enough to fit over bandages, boots, and casts. 9. Don't wear slip-on shoes. Sneakers are preferred. 10. Bring your boot with you to the surgery center/hospital.  Also bring crutches or a walker if your physician has prescribed it for you.  If you do not have this equipment, it will be provided for you after surgery. 11. If you have not been contacted by the surgery center/hospital by the day before your surgery, call to confirm the date and time of your surgery. 12. Leave-time from work may vary depending on the type of surgery you have.  Appropriate arrangements should be made prior to surgery with your employer. 13. Prescriptions will be provided immediately following surgery by your doctor.  Fill these as soon as possible after surgery and take the medication as directed. Pain medications will not be refilled on weekends and must be approved by the doctor. 14. Remove nail polish on the operative foot and avoid getting pedicures prior to surgery. 15. Wash the night before surgery.  The night before surgery wash the foot and leg well with water and the antibacterial soap provided. Be sure to pay special attention to beneath the toenails and in between the toes.  Wash for at least three (3) minutes. Rinse thoroughly with water and dry well with a towel.  Perform this wash unless told not to do so by your physician.  Enclosed: 1 Ice pack (please put in freezer the night before surgery)   1 Hibiclens skin cleaner     Pre-op instructions  If you have any questions regarding the instructions, please do not hesitate to call our office.  [place]: [address], [place], [place] [address] -- [phone]  [place]: [address] [place], [place] [address] -- [phone]  [place]: [address]  , [place] [address] -- [phone]  [place]: [address], [address], [place],  [address] -- [phone]  Website:  [email or web address] 

## 2017-05-27 ENCOUNTER — Other Ambulatory Visit: Payer: Self-pay | Admitting: Family Medicine

## 2017-05-27 DIAGNOSIS — I10 Essential (primary) hypertension: Secondary | ICD-10-CM

## 2017-05-29 ENCOUNTER — Telehealth: Payer: Self-pay | Admitting: Cardiovascular Disease

## 2017-05-29 NOTE — Telephone Encounter (Signed)
error 

## 2017-05-31 ENCOUNTER — Telehealth: Payer: Self-pay | Admitting: Podiatry

## 2017-05-31 ENCOUNTER — Encounter: Payer: Self-pay | Admitting: Family Medicine

## 2017-05-31 NOTE — Telephone Encounter (Signed)
Need to get my surgery scheduled. My phone number is [phone].

## 2017-06-01 ENCOUNTER — Telehealth: Payer: Self-pay | Admitting: Podiatry

## 2017-06-01 NOTE — Telephone Encounter (Signed)
Called pt stating we got message in regards to scheduling her surgery. Pt stated before that she needs to get medical clearance from her cardiologist who is Dr. Kirke CorinArida because she is on Plavix and Asprin. I told the pt we could go ahead and get her tentatively scheduled for surgery on 14 May that way we have enough time to get her clearance. Pt stated that was fine. I told her she could go ahead and register online with the surgical center and pt stated she does not use a computer. I told her that was fine, that the surgical center would call her and get the information over the phone. I told the pt they would call her 24-48 hours before her surgery to let her know what time they want her to arrive. I explained it depends on their schedule, and if a pt is diabetic or a child that they get them in first. Pt stated she is also a diabetic. I told her to call us with any questions or concerns between now and the date of her surgery.

## 2017-06-09 ENCOUNTER — Encounter: Payer: Self-pay | Admitting: *Deleted

## 2017-06-12 ENCOUNTER — Telehealth: Payer: Self-pay | Admitting: *Deleted

## 2017-06-12 NOTE — Telephone Encounter (Signed)
"  I got a call from somebody there today about my surgery.  Could you please give me a call back.  Thank you."

## 2017-06-12 NOTE — Progress Notes (Signed)
Based on her most recent cardiovascular evaluation in February of this year, the patient can proceed with endoscopic plantar fasciotomy at an overall low risk from cardiovascular standpoint.  Plavix can be held 5 days before the surgery.  If there are any issues with wound healing postoperatively, please keep me updated.  Thank you  Lorine Bears, MD

## 2017-06-13 NOTE — Telephone Encounter (Signed)
I called the patient

## 2017-06-14 ENCOUNTER — Other Ambulatory Visit: Payer: Self-pay | Admitting: Family Medicine

## 2017-06-16 ENCOUNTER — Other Ambulatory Visit: Payer: Self-pay | Admitting: Family Medicine

## 2017-06-19 ENCOUNTER — Other Ambulatory Visit: Payer: Self-pay | Admitting: Family Medicine

## 2017-06-19 NOTE — Telephone Encounter (Signed)
Refill on hydrocodone to [facility]

## 2017-06-20 ENCOUNTER — Other Ambulatory Visit: Payer: Self-pay | Admitting: Family Medicine

## 2017-06-20 MED ORDER — HYDROCODONE-ACETAMINOPHEN 5-325 MG PO TABS: 1.0000 | ORAL_TABLET | Freq: Four times a day (QID) | ORAL | 0 refills | Status: DC

## 2017-06-20 NOTE — Telephone Encounter (Signed)
Patient is requesting a refill on Hydrocodone   LOV: 04/06/17  LRF: 05/23/17

## 2017-06-26 ENCOUNTER — Other Ambulatory Visit: Payer: Self-pay | Admitting: Family Medicine

## 2017-06-26 ENCOUNTER — Other Ambulatory Visit: Payer: Self-pay

## 2017-06-26 ENCOUNTER — Telehealth: Payer: Self-pay | Admitting: *Deleted

## 2017-06-26 ENCOUNTER — Inpatient Hospital Stay (HOSPITAL_COMMUNITY)
Admission: EM | Admit: 2017-06-26 | Discharge: 2017-06-30 | DRG: 439 | Disposition: A | Discharge status: Home / Self Care | Payer: BLUE CROSS/BLUE SHIELD | Attending: Internal Medicine | Admitting: Internal Medicine

## 2017-06-26 ENCOUNTER — Telehealth: Payer: Self-pay | Admitting: Podiatry

## 2017-06-26 ENCOUNTER — Encounter (HOSPITAL_COMMUNITY): Payer: Self-pay | Admitting: Emergency Medicine

## 2017-06-26 ENCOUNTER — Emergency Department (HOSPITAL_COMMUNITY): Payer: BLUE CROSS/BLUE SHIELD

## 2017-06-26 DIAGNOSIS — Z9851 Tubal ligation status: Secondary | ICD-10-CM

## 2017-06-26 DIAGNOSIS — K858 Other acute pancreatitis without necrosis or infection: Principal | ICD-10-CM | POA: Diagnosis present

## 2017-06-26 DIAGNOSIS — R1013 Epigastric pain: Secondary | ICD-10-CM

## 2017-06-26 DIAGNOSIS — Z91011 Allergy to milk products: Secondary | ICD-10-CM

## 2017-06-26 DIAGNOSIS — Z803 Family history of malignant neoplasm of breast: Secondary | ICD-10-CM

## 2017-06-26 DIAGNOSIS — I70201 Unspecified atherosclerosis of native arteries of extremities, right leg: Secondary | ICD-10-CM | POA: Diagnosis present

## 2017-06-26 DIAGNOSIS — K859 Acute pancreatitis without necrosis or infection, unspecified: Secondary | ICD-10-CM | POA: Diagnosis present

## 2017-06-26 DIAGNOSIS — Z7982 Long term (current) use of aspirin: Secondary | ICD-10-CM

## 2017-06-26 DIAGNOSIS — I739 Peripheral vascular disease, unspecified: Secondary | ICD-10-CM | POA: Diagnosis present

## 2017-06-26 DIAGNOSIS — Z91040 Latex allergy status: Secondary | ICD-10-CM

## 2017-06-26 DIAGNOSIS — J449 Chronic obstructive pulmonary disease, unspecified: Secondary | ICD-10-CM | POA: Diagnosis present

## 2017-06-26 DIAGNOSIS — K862 Cyst of pancreas: Secondary | ICD-10-CM | POA: Diagnosis present

## 2017-06-26 DIAGNOSIS — Z7951 Long term (current) use of inhaled steroids: Secondary | ICD-10-CM

## 2017-06-26 DIAGNOSIS — K219 Gastro-esophageal reflux disease without esophagitis: Secondary | ICD-10-CM | POA: Diagnosis present

## 2017-06-26 DIAGNOSIS — E1151 Type 2 diabetes mellitus with diabetic peripheral angiopathy without gangrene: Secondary | ICD-10-CM | POA: Diagnosis present

## 2017-06-26 DIAGNOSIS — I2510 Atherosclerotic heart disease of native coronary artery without angina pectoris: Secondary | ICD-10-CM | POA: Diagnosis present

## 2017-06-26 DIAGNOSIS — E1159 Type 2 diabetes mellitus with other circulatory complications: Secondary | ICD-10-CM | POA: Diagnosis present

## 2017-06-26 DIAGNOSIS — Z96653 Presence of artificial knee joint, bilateral: Secondary | ICD-10-CM | POA: Diagnosis present

## 2017-06-26 DIAGNOSIS — K861 Other chronic pancreatitis: Secondary | ICD-10-CM

## 2017-06-26 DIAGNOSIS — Z7902 Long term (current) use of antithrombotics/antiplatelets: Secondary | ICD-10-CM

## 2017-06-26 DIAGNOSIS — Z887 Allergy status to serum and vaccine status: Secondary | ICD-10-CM

## 2017-06-26 DIAGNOSIS — I701 Atherosclerosis of renal artery: Secondary | ICD-10-CM | POA: Diagnosis present

## 2017-06-26 DIAGNOSIS — Z87891 Personal history of nicotine dependence: Secondary | ICD-10-CM

## 2017-06-26 DIAGNOSIS — F17200 Nicotine dependence, unspecified, uncomplicated: Secondary | ICD-10-CM | POA: Diagnosis present

## 2017-06-26 DIAGNOSIS — Z888 Allergy status to other drugs, medicaments and biological substances status: Secondary | ICD-10-CM

## 2017-06-26 DIAGNOSIS — Z7984 Long term (current) use of oral hypoglycemic drugs: Secondary | ICD-10-CM

## 2017-06-26 DIAGNOSIS — I10 Essential (primary) hypertension: Secondary | ICD-10-CM | POA: Diagnosis present

## 2017-06-26 DIAGNOSIS — E785 Hyperlipidemia, unspecified: Secondary | ICD-10-CM | POA: Diagnosis present

## 2017-06-26 DIAGNOSIS — Z825 Family history of asthma and other chronic lower respiratory diseases: Secondary | ICD-10-CM

## 2017-06-26 LAB — I-STAT TROPONIN, ED: TROPONIN I, POC: 0 ng/mL (ref 0.00–0.08)

## 2017-06-26 LAB — BASIC METABOLIC PANEL
Anion gap: 13 (ref 5–15)
BUN: 24 mg/dL — AB (ref 6–20)
CALCIUM: 10 mg/dL (ref 8.9–10.3)
CO2: 25 mmol/L (ref 22–32)
CREATININE: 1.28 mg/dL — AB (ref 0.44–1.00)
Chloride: 102 mmol/L (ref 101–111)
GFR calc Af Amer: 52 mL/min — ABNORMAL LOW (ref >60)
GFR, EST NON AFRICAN AMERICAN: 45 mL/min — AB (ref >60)
GLUCOSE: 85 mg/dL (ref 65–99)
Potassium: 4.1 mmol/L (ref 3.5–5.1)
Sodium: 140 mmol/L (ref 135–145)

## 2017-06-26 LAB — URINALYSIS, ROUTINE W REFLEX MICROSCOPIC
BILIRUBIN URINE: NEGATIVE
GLUCOSE, UA: NEGATIVE mg/dL
KETONES UR: NEGATIVE mg/dL
LEUKOCYTES UA: NEGATIVE
Nitrite: POSITIVE — AB
PH: 5 (ref 5.0–8.0)
PROTEIN: NEGATIVE mg/dL
Specific Gravity, Urine: 1.011 (ref 1.005–1.030)

## 2017-06-26 LAB — CBC
HCT: 39.4 % (ref 36.0–46.0)
Hemoglobin: 12.4 g/dL (ref 12.0–15.0)
MCH: 28.2 pg (ref 26.0–34.0)
MCHC: 31.5 g/dL (ref 30.0–36.0)
MCV: 89.5 fL (ref 78.0–100.0)
Platelets: 330 10*3/uL (ref 150–400)
RBC: 4.4 MIL/uL (ref 3.87–5.11)
RDW: 14.9 % (ref 11.5–15.5)
WBC: 8 10*3/uL (ref 4.0–10.5)

## 2017-06-26 LAB — I-STAT BETA HCG BLOOD, ED (MC, WL, AP ONLY): I-stat hCG, quantitative: <5 m[IU]/mL (ref <5)

## 2017-06-26 LAB — LIPASE, BLOOD: Lipase: 192 U/L — ABNORMAL HIGH (ref 11–51)

## 2017-06-26 MED ORDER — OXYCODONE-ACETAMINOPHEN 5-325 MG PO TABS
1.0000 | ORAL_TABLET | ORAL | Status: DC | PRN
  Administered 2017-06-26: 1 via ORAL
  Filled 2017-06-26: qty 1

## 2017-06-26 NOTE — Telephone Encounter (Signed)
"  We received a call from [employer].  She said she was told by the nurse from your office to call us to see if it's okay for her to have her surgery scheduled for tomorrow.  She said she has pancreatitis.  We don't make those decisions."  I don't know who she spoke to when she called here.  I will give her a call and reschedule her surgery.  "Okay let me know as soon as possible."  I attempted to call the patient, left her messages to call me back.

## 2017-06-26 NOTE — ED Triage Notes (Signed)
Pt states she has a history of pancreatitis and this feels similar with side and abdominal pain but also has a component of chest pain/sharp/pressure radiating to back..  Constant.  Vomiting and nausea present. Cannot tolerate po without increased pain.

## 2017-06-26 NOTE — ED Notes (Signed)
Patient transported to X-ray 

## 2017-06-26 NOTE — Telephone Encounter (Signed)
I'm scheduled to have surgery tomorrow but I'm experiencing pancreatitis. I was wondering if it would be okay for me to still have my surgery tomorrow or should I cancel? I told the pt to call the surgical center and speak to one of the nurses and let them know she is scheduled for surgery tomorrow and what is going on. I explained they could better help her than we could. I asked the pt if she needed their phone number to which she said she had it but when she tried calling them earlier this morning all she got was a recording.

## 2017-06-26 NOTE — ED Notes (Signed)
Pt requesting something for pain.  

## 2017-06-26 NOTE — Telephone Encounter (Signed)
Pt states she is scheduled for surgery tomorrow and has a problem.

## 2017-06-26 NOTE — Telephone Encounter (Signed)
"  I'm returning your call."  Yes, I received a message from the surgical center that you were need to cancel your surgery.  "Yes, I have Pancreatitis and it has flared up.  So, I'm going to have to cancel my surgery.  Usually when if flares up, I end up in the hospital.  I will call back to reschedule my surgery once it is better.  I don't want to schedule another date and have to keep canceling it because I don't know how long it will take to get over this episode."  I'll let Dr. Charlsie Merles know.  I hope you feel better soon.  I called Aram Beecham at [facility].  She had already canceled the patient's surgery for tomorrow.

## 2017-06-27 ENCOUNTER — Emergency Department (HOSPITAL_COMMUNITY): Payer: BLUE CROSS/BLUE SHIELD

## 2017-06-27 DIAGNOSIS — K859 Acute pancreatitis without necrosis or infection, unspecified: Secondary | ICD-10-CM | POA: Diagnosis not present

## 2017-06-27 DIAGNOSIS — Z7982 Long term (current) use of aspirin: Secondary | ICD-10-CM | POA: Diagnosis not present

## 2017-06-27 DIAGNOSIS — Z7984 Long term (current) use of oral hypoglycemic drugs: Secondary | ICD-10-CM | POA: Diagnosis not present

## 2017-06-27 DIAGNOSIS — K861 Other chronic pancreatitis: Secondary | ICD-10-CM | POA: Diagnosis present

## 2017-06-27 DIAGNOSIS — Z825 Family history of asthma and other chronic lower respiratory diseases: Secondary | ICD-10-CM | POA: Diagnosis not present

## 2017-06-27 DIAGNOSIS — I70201 Unspecified atherosclerosis of native arteries of extremities, right leg: Secondary | ICD-10-CM | POA: Diagnosis present

## 2017-06-27 DIAGNOSIS — E1151 Type 2 diabetes mellitus with diabetic peripheral angiopathy without gangrene: Secondary | ICD-10-CM | POA: Diagnosis present

## 2017-06-27 DIAGNOSIS — I10 Essential (primary) hypertension: Secondary | ICD-10-CM | POA: Diagnosis present

## 2017-06-27 DIAGNOSIS — Z87891 Personal history of nicotine dependence: Secondary | ICD-10-CM | POA: Diagnosis not present

## 2017-06-27 DIAGNOSIS — J449 Chronic obstructive pulmonary disease, unspecified: Secondary | ICD-10-CM | POA: Diagnosis present

## 2017-06-27 DIAGNOSIS — K219 Gastro-esophageal reflux disease without esophagitis: Secondary | ICD-10-CM | POA: Diagnosis present

## 2017-06-27 DIAGNOSIS — E785 Hyperlipidemia, unspecified: Secondary | ICD-10-CM | POA: Diagnosis present

## 2017-06-27 DIAGNOSIS — K858 Other acute pancreatitis without necrosis or infection: Secondary | ICD-10-CM | POA: Diagnosis present

## 2017-06-27 DIAGNOSIS — Z9851 Tubal ligation status: Secondary | ICD-10-CM | POA: Diagnosis not present

## 2017-06-27 DIAGNOSIS — I701 Atherosclerosis of renal artery: Secondary | ICD-10-CM | POA: Diagnosis present

## 2017-06-27 DIAGNOSIS — Z7902 Long term (current) use of antithrombotics/antiplatelets: Secondary | ICD-10-CM | POA: Diagnosis not present

## 2017-06-27 DIAGNOSIS — I2510 Atherosclerotic heart disease of native coronary artery without angina pectoris: Secondary | ICD-10-CM | POA: Diagnosis present

## 2017-06-27 DIAGNOSIS — Z91011 Allergy to milk products: Secondary | ICD-10-CM | POA: Diagnosis not present

## 2017-06-27 DIAGNOSIS — E1159 Type 2 diabetes mellitus with other circulatory complications: Secondary | ICD-10-CM | POA: Diagnosis present

## 2017-06-27 DIAGNOSIS — Z888 Allergy status to other drugs, medicaments and biological substances status: Secondary | ICD-10-CM | POA: Diagnosis not present

## 2017-06-27 DIAGNOSIS — Z887 Allergy status to serum and vaccine status: Secondary | ICD-10-CM | POA: Diagnosis not present

## 2017-06-27 DIAGNOSIS — I739 Peripheral vascular disease, unspecified: Secondary | ICD-10-CM | POA: Diagnosis not present

## 2017-06-27 DIAGNOSIS — K862 Cyst of pancreas: Secondary | ICD-10-CM | POA: Diagnosis present

## 2017-06-27 DIAGNOSIS — Z96653 Presence of artificial knee joint, bilateral: Secondary | ICD-10-CM | POA: Diagnosis present

## 2017-06-27 DIAGNOSIS — Z803 Family history of malignant neoplasm of breast: Secondary | ICD-10-CM | POA: Diagnosis not present

## 2017-06-27 DIAGNOSIS — R1013 Epigastric pain: Secondary | ICD-10-CM | POA: Diagnosis present

## 2017-06-27 DIAGNOSIS — Z91040 Latex allergy status: Secondary | ICD-10-CM | POA: Diagnosis not present

## 2017-06-27 LAB — GLUCOSE, CAPILLARY: Glucose-Capillary: 138 mg/dL — ABNORMAL HIGH (ref 65–99)

## 2017-06-27 LAB — HEPATIC FUNCTION PANEL
ALT: 14 U/L (ref 14–54)
AST: 18 U/L (ref 15–41)
Albumin: 3.7 g/dL (ref 3.5–5.0)
Alkaline Phosphatase: 69 U/L (ref 38–126)
BILIRUBIN TOTAL: 0.5 mg/dL (ref 0.3–1.2)
Bilirubin, Direct: 0.1 mg/dL (ref 0.1–0.5)
Indirect Bilirubin: 0.4 mg/dL (ref 0.3–0.9)
TOTAL PROTEIN: 7.1 g/dL (ref 6.5–8.1)

## 2017-06-27 LAB — CBG MONITORING, ED
Glucose-Capillary: 100 mg/dL — ABNORMAL HIGH (ref 65–99)
Glucose-Capillary: 128 mg/dL — ABNORMAL HIGH (ref 65–99)

## 2017-06-27 LAB — TRIGLYCERIDES: Triglycerides: 147 mg/dL (ref <150)

## 2017-06-27 LAB — HIV ANTIBODY (ROUTINE TESTING W REFLEX): HIV SCREEN 4TH GENERATION: NONREACTIVE

## 2017-06-27 LAB — HEMOGLOBIN A1C
HEMOGLOBIN A1C: 6.7 % — AB (ref 4.8–5.6)
Mean Plasma Glucose: 145.59 mg/dL

## 2017-06-27 LAB — TROPONIN I: Troponin I: <0.03 ng/mL (ref <0.03)

## 2017-06-27 MED ORDER — MORPHINE SULFATE (PF) 4 MG/ML IV SOLN
4.0000 mg | Freq: Once | INTRAVENOUS | Status: AC
  Administered 2017-06-27: 4 mg via INTRAVENOUS
  Filled 2017-06-27: qty 1

## 2017-06-27 MED ORDER — ALBUTEROL SULFATE (2.5 MG/3ML) 0.083% IN NEBU: 3.0000 mL | INHALATION_SOLUTION | Freq: Four times a day (QID) | RESPIRATORY_TRACT | Status: DC | PRN

## 2017-06-27 MED ORDER — SODIUM CHLORIDE 0.9 % IV BOLUS
1000.0000 mL | Freq: Once | INTRAVENOUS | Status: AC
  Administered 2017-06-27: 1000 mL via INTRAVENOUS

## 2017-06-27 MED ORDER — INSULIN ASPART 100 UNIT/ML ~~LOC~~ SOLN
0.0000 [IU] | Freq: Three times a day (TID) | SUBCUTANEOUS | Status: DC
  Administered 2017-06-27 – 2017-06-30 (×4): 1 [IU] via SUBCUTANEOUS
  Filled 2017-06-27: qty 1

## 2017-06-27 MED ORDER — DICLOFENAC SODIUM 1 % TD GEL
2.0000 g | Freq: Four times a day (QID) | TRANSDERMAL | Status: DC | PRN
  Filled 2017-06-27: qty 100

## 2017-06-27 MED ORDER — HYDROCODONE-ACETAMINOPHEN 5-325 MG PO TABS
1.0000 | ORAL_TABLET | Freq: Four times a day (QID) | ORAL | Status: DC | PRN
  Administered 2017-06-27: 1 via ORAL
  Administered 2017-06-28 – 2017-06-29 (×3): 2 via ORAL
  Administered 2017-06-29: 1 via ORAL
  Administered 2017-06-30: 2 via ORAL
  Filled 2017-06-27: qty 1
  Filled 2017-06-27 (×4): qty 2
  Filled 2017-06-27: qty 1

## 2017-06-27 MED ORDER — MORPHINE SULFATE (PF) 4 MG/ML IV SOLN: 1.0000 mg | INTRAVENOUS | Status: DC | PRN

## 2017-06-27 MED ORDER — ONDANSETRON HCL 4 MG/2ML IJ SOLN
4.0000 mg | Freq: Once | INTRAMUSCULAR | Status: AC
  Administered 2017-06-27: 4 mg via INTRAVENOUS
  Filled 2017-06-27: qty 2

## 2017-06-27 MED ORDER — INSULIN ASPART 100 UNIT/ML ~~LOC~~ SOLN: 0.0000 [IU] | Freq: Every day | SUBCUTANEOUS | Status: DC

## 2017-06-27 MED ORDER — MOMETASONE FURO-FORMOTEROL FUM 200-5 MCG/ACT IN AERO
2.0000 | INHALATION_SPRAY | Freq: Two times a day (BID) | RESPIRATORY_TRACT | Status: DC
Start: 2017-06-27 — End: 2017-06-30
  Administered 2017-06-27 – 2017-06-30 (×6): 2 via RESPIRATORY_TRACT
  Filled 2017-06-27: qty 8.8

## 2017-06-27 MED ORDER — CLOPIDOGREL BISULFATE 75 MG PO TABS
75.0000 mg | ORAL_TABLET | Freq: Every day | ORAL | Status: DC
  Administered 2017-06-27 – 2017-06-30 (×4): 75 mg via ORAL
  Filled 2017-06-27 (×4): qty 1

## 2017-06-27 MED ORDER — LACTATED RINGERS IV SOLN
INTRAVENOUS | Status: AC
  Administered 2017-06-27: 125 mL/h via INTRAVENOUS

## 2017-06-27 MED ORDER — ISOSORBIDE MONONITRATE ER 60 MG PO TB24
60.0000 mg | ORAL_TABLET | Freq: Every day | ORAL | Status: DC
  Administered 2017-06-27 – 2017-06-30 (×4): 60 mg via ORAL
  Filled 2017-06-27 (×4): qty 1

## 2017-06-27 MED ORDER — IOHEXOL 300 MG/ML  SOLN
100.0000 mL | Freq: Once | INTRAMUSCULAR | Status: AC | PRN
  Administered 2017-06-27: 80 mL via INTRAVENOUS

## 2017-06-27 NOTE — ED Provider Notes (Signed)
[facility] EMERGENCY DEPARTMENT Provider Note   CSN: [id number] Arrival date & time: 06/26/17  [place]     History   Chief Complaint Chief Complaint  Patient presents with  . Chest Pain  . Flank Pain    HPI [name] is a 61 y.o. female with a history of pancreatitis, COPD, CAD, diabetes, hypertension, hyperlipidemia who presents the emergency department today for epigastric abdominal pain that is been worsening over the last 2 days.  Patient states she has a history of pancreatitis and feels this is similar.  She states that she has been having epigastric abdominal pain that has been worsening over the last 48 hours that she describes as a burning sensation and radiates to her back.  She reports she has constant nausea with this as well as several episodes of nonbilious, non-bloody emesis.  She states that she has had associated anorexia and has had decreased p.o. intake as eating makes the symptoms worse.  Patient has been trying over-the-counter medications for symptoms without any relief.  Patient's most recent episode of pancreatitis was in 2014.  It was thought this may be secondary to her medications including HCTZ, metformin and lisinopril.  Right upper quadrant ultrasound at that time did not reveal any evidence of gallstones.  Patient denies symptoms starting after fatty greasy foods.  She notes prior history of alcohol abuse but has not had an alcoholic beverage in greater than 20 years.  Patient denies any fever, chills, exertional chest pain, shortness of breath, cough, hemoptysis, diarrhea, urinary frequency, urinary urgency, dysuria, hematuria.  Patient denies any previous abdominal surgeries.  Last bowel movement today normal.  She is still passing gas.  HPI  Past Medical History:  Diagnosis Date  . Asthma with COPD ([place])   . CAD (coronary artery disease)    a. [place] 2011: Diffuse distal and branch vessel disease - patient managed medically, no  interventional options. b. [place] 03/2014 - low risk, no ischemia.  . Complication of anesthesia    slow to wake up with last surgery in 2011   . Diabetes mellitus with circulatory complication ([place])   . Dyslipidemia   . Headache    hx of migraines   . Hypertension   . Osteoarthritis    severe right knee  R TKR  . PAD (peripheral artery disease) ([place])    a. Severe stenosis mid right SFA s/p atherectomy 03/25/09. b. peripheral angiography in 12/2011 which showed only about 50% diffuse right SFA stenosis.  . Pancreatitis 2003  . Renal artery stenosis ([place])    a. [place] 2011 - 40-50% left RAS.  . Tobacco use disorder    quit 11/10    Patient Active Problem List   Diagnosis Date Noted  . Renal artery stenosis ([place])   . CAD (coronary artery disease)   . Diabetes mellitus with circulatory complication ([place])   . Dyslipidemia   . History of total knee arthroplasty 05/22/2014  . Pancreatitis 01/27/2013  . Cutaneous skin tags 09/09/2012  . Bilateral leg pain 07/20/2012  . Nausea and vomiting in adult 10/25/2011  . Arthritis of knee 10/25/2011  . GERD (gastroesophageal reflux disease) 11/04/2010  . Coronary atherosclerosis 03/02/2009  . PAD (peripheral artery disease) ([place]) 01/30/2009  . Diabetes mellitus, type II ([place]) 01/02/2009  . TOBACCO ABUSE 01/02/2009  . HYPERTENSION 01/02/2009    Past Surgical History:  Procedure Laterality Date  . ABDOMINAL AORTAGRAM N/A 12/21/2011   Procedure: ABDOMINAL Maxcine Ham;  Surgeon: Wellington Hampshire, MD;  Location: [facility] CATH LAB;  Service: Cardiovascular;  Laterality: N/A;  . CARDIAC CATHETERIZATION    . LOWER EXTREMITY ANGIOGRAM Bilateral 05/27/2015   Procedure: Lower Extremity Angiogram;  Surgeon: Wellington Hampshire, MD;  Location: [facility] CV LAB;  Service: Cardiovascular;  Laterality: Bilateral;  . PERIPHERAL VASCULAR CATHETERIZATION N/A 05/27/2015   Procedure: Abdominal Aortogram;  Surgeon: Wellington Hampshire, MD;  Location: [facility];  Service:  Cardiovascular;  Laterality: N/A;  . PERIPHERAL VASCULAR CATHETERIZATION Right 05/27/2015   Procedure: Peripheral Vascular Intervention;  Surgeon: Wellington Hampshire, MD;  Location: [facility] CV LAB;  Service: Cardiovascular;  Laterality: Right;  SFA  . TOTAL KNEE ARTHROPLASTY Right 2011  . TOTAL KNEE ARTHROPLASTY Left 05/22/2014   Procedure: LEFT TOTAL KNEE ARTHROPLASTY;  Surgeon: Latanya Maudlin, MD;  Location: [facility];  Service: Orthopedics;  Laterality: Left;  . TUBAL LIGATION  1980     OB History   None      Home Medications    Prior to Admission medications   Medication Sig Start Date End Date Taking? Authorizing Provider  ADVAIR DISKUS 250-50 MCG/DOSE AEPB INHALE 1 PUFF INTO THE LUNGS EVERY 12 HOURS 02/02/17   Alycia Rossetti, MD  aspirin 81 MG tablet Take 81 mg by mouth daily.      [provider]  Biotin w/ Vitamins C & E (HAIR/SKIN/NAILS PO) Take 1 tablet by mouth daily.    [provider]  bisoprolol-hydrochlorothiazide [facility]) 2.5-6.25 MG tablet TAKE 1 TABLET BY MOUTH DAILY 06/14/17   Susy Frizzle, MD  Blood Glucose Monitoring Suppl (BAYER CONTOUR MONITOR) w/Device KIT CHECK BLOOD SUGAR EVERY DAY 08/20/15   Susy Frizzle, MD  clopidogrel (PLAVIX) 75 MG tablet Take 1 tablet (75 mg total) by mouth daily. 04/06/17   Wellington Hampshire, MD  CONTOUR NEXT TEST test strip CHECK BLOOD SUGAR EVERY DAY 09/05/16   Susy Frizzle, MD  diclofenac (VOLTAREN) 75 MG EC tablet Take 1 tablet (75 mg total) by mouth 2 (two) times daily. 01/12/17   Wallene Huh, DPM  diclofenac sodium (VOLTAREN) 1 % GEL Apply 2 g topically 4 (four) times daily. 05/04/16   Susy Frizzle, MD  diphenhydrAMINE (BENADRYL) 25 mg capsule Take 25 mg by mouth every 6 (six) hours as needed for allergies.    [provider]  fish oil-omega-3 fatty acids 1000 MG capsule Take 2 capsules (2 g total) by mouth 2 (two) times daily. 02/03/12   Larey Dresser, MD  glimepiride (AMARYL) 4 MG  tablet TAKE 1 TABLET BY MOUTH EVERY DAY WITH LUNCH 04/24/17   Susy Frizzle, MD  GuaiFENesin (MUCUS RELIEF PO) Take 10 mLs by mouth as needed (for congestion).    [provider]  HYDROcodone-acetaminophen (NORCO/VICODIN) 5-325 MG tablet Take 1 tablet by mouth 4 (four) times daily. 06/20/17   Susy Frizzle, MD  isosorbide mononitrate (IMDUR) 60 MG 24 hr tablet TAKE 1 TABLET BY MOUTH DAILY 05/29/17   Susy Frizzle, MD  lisinopril (PRINIVIL,ZESTRIL) 10 MG tablet TAKE 1 TABLET BY MOUTH DAILY 05/29/17   Susy Frizzle, MD  metFORMIN (GLUCOPHAGE) 1000 MG tablet TAKE 1 TABLET BY MOUTH TWICE DAILY WITH MEALS 04/14/17   Susy Frizzle, MD  methocarbamol (ROBAXIN) 500 MG tablet TAKE 1 TABLET BY MOUTH EVERY 6 HOURS AS NEEDED FOR MUSCLE SPASMS 06/16/17   Susy Frizzle, MD  Multiple Vitamin (MULTIVITAMIN) tablet Take 1 tablet by mouth daily.  [provider]  ondansetron (ZOFRAN) 4 MG tablet TAKE 1 TABLET(4 MG) BY MOUTH EVERY 8 HOURS AS NEEDED FOR NAUSEA OR VOMITING 05/24/17   Susy Frizzle, MD  pantoprazole (PROTONIX) 40 MG tablet TAKE 1 TABLET BY MOUTH DAILY 04/03/17   Susy Frizzle, MD  pioglitazone (ACTOS) 30 MG tablet TAKE 1 TABLET(30 MG) BY MOUTH DAILY 12/26/16   Susy Frizzle, MD  pioglitazone (ACTOS) 30 MG tablet TAKE 1 TABLET BY MOUTH DAILY 06/20/17   Susy Frizzle, MD  Probiotic Product (TRUBIOTICS PO) Take 1 capsule by mouth daily.    [provider]  rosuvastatin (CRESTOR) 40 MG tablet TAKE 1 TABLET BY MOUTH DAILY 06/20/17   Susy Frizzle, MD  VENTOLIN HFA 108 (90 Base) MCG/ACT inhaler INHALE 2 PUFFS BY MOUTH TWICE DAILY AS NEEDED FOR WHEEZING OR SHORTNESS OF BREATH 08/16/16   Susy Frizzle, MD    Family History Family History  Problem Relation Age of Onset  . CVA Mother   . Emphysema Father   . Breast cancer Sister 26    Social History Social History   Tobacco Use  . Smoking status: Former Smoker    Last attempt to quit:  12/13/2008    Years since quitting: 8.5  . Smokeless tobacco: Never Used  Substance Use Topics  . Alcohol use: No  . Drug use: No     Allergies   Gabapentin; Glucotrol [glipizide]; Influenza vaccines; Latex; Milk-related compounds; Nortriptyline; Tetanus toxoid; and Invokana [canagliflozin]   Review of Systems Review of Systems  All other systems reviewed and are negative.    Physical Exam Updated Vital Signs BP 124/75   Pulse 87   Temp 98.2 F (36.8 C) (Oral)   Resp 13   SpO2 97%   Physical Exam  Constitutional: She appears well-developed and well-nourished.  HENT:  Head: Normocephalic and atraumatic.  Right Ear: External ear normal.  Left Ear: External ear normal.  Nose: Nose normal.  Mouth/Throat: Uvula is midline and oropharynx is clear and moist. Mucous membranes are dry. No tonsillar exudate.  Eyes: Pupils are equal, round, and reactive to light. Right eye exhibits no discharge. Left eye exhibits no discharge. No scleral icterus.  Neck: Trachea normal. Neck supple. No spinous process tenderness present. No neck rigidity. Normal range of motion present.  Cardiovascular: Normal rate, regular rhythm and intact distal pulses.  No murmur heard. Pulses:      Radial pulses are 2+ on the right side, and 2+ on the left side.       Dorsalis pedis pulses are 2+ on the right side, and 2+ on the left side.       Posterior tibial pulses are 2+ on the right side, and 2+ on the left side.  No lower extremity swelling or edema. Calves symmetric in size bilaterally.  Pulmonary/Chest: Effort normal and breath sounds normal. She exhibits no tenderness.  Abdominal: Soft. Bowel sounds are normal. There is tenderness in the right upper quadrant, epigastric area and left upper quadrant. There is no rigidity, no rebound, no guarding and no CVA tenderness.  Musculoskeletal: She exhibits no edema.  Lymphadenopathy:    She has no cervical adenopathy.  Neurological: She is alert.  Skin:  Skin is warm, dry and intact. Capillary refill takes less than 2 seconds. No rash noted. She is not diaphoretic.  Psychiatric: She has a normal mood and affect.  Nursing note and vitals reviewed.    ED Treatments / Results  Labs (all  labs ordered are listed, but only abnormal results are displayed) Labs Reviewed  BASIC METABOLIC PANEL - Abnormal; Notable for the following components:      Result Value   BUN 24 (*)    Creatinine, Ser 1.28 (*)    GFR calc non Af Amer 45 (*)    GFR calc Af Amer 52 (*)    All other components within normal limits  LIPASE, BLOOD - Abnormal; Notable for the following components:   Lipase 192 (*)    All other components within normal limits  URINALYSIS, ROUTINE W REFLEX MICROSCOPIC - Abnormal; Notable for the following components:   APPearance HAZY (*)    Hgb urine dipstick SMALL (*)    Nitrite POSITIVE (*)    Bacteria, UA RARE (*)    All other components within normal limits  CBC  I-STAT TROPONIN, ED  I-STAT BETA HCG BLOOD, ED ([facility], [facility], [facility] ONLY)    EKG None  Radiology Dg Chest 2 View  Result Date: 06/26/2017 CLINICAL DATA:  Pt states she has a history of pancreatitis and this feels similar with side and abdominal pain but also has a component of chest pain/sharp/pressure radiating to back. Constant. Vomiting and nausea present. Cannot tolerate po without increased pain. EXAM: CHEST - 2 VIEW COMPARISON:  Chest x-ray dated 05/13/2014. FINDINGS: The heart size and mediastinal contours are within normal limits. Aortic atherosclerosis noted. Both lungs are clear. Mild degenerative spurring within the slightly kyphotic thoracic spine. No acute or suspicious osseous finding. IMPRESSION: No active cardiopulmonary disease. No evidence of pneumonia or pulmonary edema. Electronically Signed   By: Franki Cabot M.D.   On: 06/26/2017 21:36   Ct Abdomen Pelvis W Contrast  Result Date: 06/27/2017 CLINICAL DATA:  62 y/o F; abdominal pain with history of  pancreatitis. EXAM: CT ABDOMEN AND PELVIS WITH CONTRAST TECHNIQUE: Multidetector CT imaging of the abdomen and pelvis was performed using the standard protocol following bolus administration of intravenous contrast. CONTRAST:  16m OMNIPAQUE IOHEXOL 300 MG/ML  SOLN COMPARISON:  01/28/2013 CT abdomen and pelvis. FINDINGS: Lower chest: Severe coronary artery calcifications. Hepatobiliary: No focal liver abnormality is seen. No gallstones, gallbladder wall thickening, or biliary dilatation. Pancreas: Extensive edema surrounding the pancreatic head, first and second segments of the duodenum, and within the mesentery compatible with acute pancreatitis. Multilobulated cyst between the left lobe of the liver and the pancreas measuring up to 19 mm. Calcification within the pancreas compatible sequelae of chronic pancreatitis. Spleen: Normal in size without focal abnormality. Adrenals/Urinary Tract: Normal adrenal glands. Multiple cysts in the kidneys measuring up to 12 mm in the right upper pole. No hydronephrosis. Normal bladder. Stomach/Bowel: Stomach is within normal limits. Appendix appears normal. No evidence of bowel wall thickening, distention, or inflammatory changes. Mild sigmoid diverticulosis, no findings of acute diverticulitis. Vascular/Lymphatic: Aortic atherosclerosis. No enlarged abdominal or pelvic lymph nodes. Reproductive: Uterus and bilateral adnexa are unremarkable. Other: No abdominal wall hernia or abnormality. No abdominopelvic ascites. Musculoskeletal: No fracture is seen. IMPRESSION: 1. Acute on chronic pancreatitis. No findings of necrosis. 18 mm peripancreatic collection between the liver and head of pancreas. 2. Mild sigmoid diverticulosis, no findings of acute diverticulitis. 3. Aortic atherosclerosis. Electronically Signed   By: LKristine GarbeM.D.   On: 06/27/2017 05:16   [place]dom[place] Limited Ruq  Result Date: 06/27/2017 CLINICAL DATA:  62y/o  F; epigastric abdominal pain. EXAM:  ULTRASOUND ABDOMEN LIMITED RIGHT UPPER QUADRANT COMPARISON:  03/26/2009 CT abdomen and pelvis. FINDINGS: Gallbladder: No gallstones or  wall thickening visualized. No sonographic Murphy sign noted by sonographer. Common bile duct: Diameter: 5.9 mm Liver: No focal lesion identified. Within normal limits in parenchymal echogenicity. Portal vein is patent on color Doppler imaging with normal direction of blood flow towards the liver. IMPRESSION: No acute process identified. Unremarkable right upper quadrant ultrasound. Electronically Signed   By: Kristine Garbe M.D.   On: 06/27/2017 04:38    Procedures Procedures (including critical care time)  Medications Ordered in ED Medications  oxyCODONE-acetaminophen (PERCOCET/ROXICET) 5-325 MG per tablet 1 tablet (1 tablet Oral Given 06/26/17 2218)  morphine 4 MG/ML injection 4 mg (has no administration in time range)  sodium chloride 0.9 % bolus 1,000 mL (1,000 mLs Intravenous New Bag/Given 06/27/17 0401)  ondansetron (ZOFRAN) injection 4 mg (4 mg Intravenous Given 06/27/17 0401)  morphine 4 MG/ML injection 4 mg (4 mg Intravenous Given 06/27/17 0401)  iohexol (OMNIPAQUE) 300 MG/ML solution 100 mL (80 mLs Intravenous Contrast Given 06/27/17 0429)     Initial Impression / Assessment and Plan / ED Course  I have reviewed the triage vital signs and the nursing notes.  Pertinent labs & imaging results that were available during my care of the patient were reviewed by me and considered in my medical decision making (see chart for details).     62 year old female with a history of pancreatitis and CAD who presents for epigastric abdominal pain with associated nausea and vomiting that she feels consistent with prior episodes of pancreatitis.  Patient vital signs are reassuring on presentation.  She is without fever, tachycardia, tachypnea, hypoxia or hypotension.  She is nonseptic appearing.  Patient does have dry mucous membranes.  She reports she has had  ongoing nausea and vomiting today.  Patient is with diffuse upper abdominal pain without peritoneal signs.  Will administer IV fluids, nausea and pain medication.  Labs are consistent with pancreatitis with a lipase 3 times the upper limit normal.  Patient denies any alcohol use recently.  Will obtain right upper quadrant ultrasound and CT scan to rule out gallstone pancreatitis versus necrosis.   Labs are otherwise reassuring.  EKG normal sinus rhythm.  Troponin within normal limits.  Symptoms are atypical for ACS.  Chest x-ray without any active cardiopulmonary disease.  No leukocytosis.  No anemia.  No significant electrolyte derangements.  Patient does have elevation in BUN and creatinine consistent with patient's dehydration 2/2 emesis.  No anion gap acidosis.  No evidence UTI.  Right upper quadrant ultrasound unremarkable.  CT scan with acute on chronic pancreatitis.  There is no evidence of necrosis.  Will admit the patient for bowel rest and pain control.  Appreciate Dr. Hal Hope for bringing the patient in the hospital  Final Clinical Impressions(s) / ED Diagnoses   Final diagnoses:  Epigastric abdominal pain  Other acute pancreatitis without infection or necrosis    ED Discharge Orders    None       Jillyn Ledger, PA-C 06/27/17 2330    Veryl Speak, MD 06/28/17 [phone]

## 2017-06-27 NOTE — ED Notes (Signed)
Dinner tray ordered.

## 2017-06-27 NOTE — ED Notes (Signed)
Attempted to call report

## 2017-06-27 NOTE — H&P (Signed)
History and Physical    [name] NOI:[id number] DOB: May 30, 1955 DOA: 06/26/2017  PCP: Susy Frizzle, MD Patient coming from: home  Chief Complaint: abdominal pain  HPI: [name] is a 62 y.o. female with medical history significant for chronic pancreatitis, COPD, CAD, diabetes, hypertension, hyperlipidemia presents to emergency Department chief complaint of epigastric pain Initial evaluation reveals acute on chronic pancreatitis. [facility] are asked to admit.  Information is obtained from the patient. She states 2 days ago she developed abdominal pain constant sharp and located make epigastric area sometimes radiating to left upper quadrant to left flank. Associated symptoms include nausea with several episodes of emesis. She denies any coffee ground emesis. She also has anorexia with decreased by mouth intake. She reports she has not had an episode of pancreatitis in 2 years. She also reports she is not consumed alcohol in over 20 years. She denies any fever chills headache dizziness syncope or near-syncope. She denies shortness of breath palpitations dysuria hematuria frequency or urgency.    ED Course: in the emergency department she is afebrile hemodynamically stable and non-toxic-appearing. She is not hypoxic  Review of Systems: As per HPI otherwise all other systems reviewed and are negative.   Ambulatory Status:ambulates independently is independent with ADLs  Past Medical History:  Diagnosis Date  . Asthma with COPD ([place])   . CAD (coronary artery disease)    a. [place] 2011: Diffuse distal and branch vessel disease - patient managed medically, no interventional options. b. [place] 03/2014 - low risk, no ischemia.  . Complication of anesthesia    slow to wake up with last surgery in 2011   . Diabetes mellitus with circulatory complication ([place])   . Dyslipidemia   . Headache    hx of migraines   . Hypertension   . Osteoarthritis    severe right knee  R TKR  . PAD  (peripheral artery disease) ([place])    a. Severe stenosis mid right SFA s/p atherectomy 03/25/09. b. peripheral angiography in 12/2011 which showed only about 50% diffuse right SFA stenosis.  . Pancreatitis 2003  . Renal artery stenosis ([place])    a. [place] 2011 - 40-50% left RAS.  . Tobacco use disorder    quit 11/10    Past Surgical History:  Procedure Laterality Date  . ABDOMINAL AORTAGRAM N/A 12/21/2011   Procedure: ABDOMINAL Maxcine Ham;  Surgeon: Wellington Hampshire, MD;  Location: [facility];  Service: Cardiovascular;  Laterality: N/A;  . CARDIAC CATHETERIZATION    . LOWER EXTREMITY ANGIOGRAM Bilateral 05/27/2015   Procedure: Lower Extremity Angiogram;  Surgeon: Wellington Hampshire, MD;  Location: [facility];  Service: Cardiovascular;  Laterality: Bilateral;  . PERIPHERAL VASCULAR CATHETERIZATION N/A 05/27/2015   Procedure: Abdominal Aortogram;  Surgeon: Wellington Hampshire, MD;  Location: [facility];  Service: Cardiovascular;  Laterality: N/A;  . PERIPHERAL VASCULAR CATHETERIZATION Right 05/27/2015   Procedure: Peripheral Vascular Intervention;  Surgeon: Wellington Hampshire, MD;  Location: [facility];  Service: Cardiovascular;  Laterality: Right;  SFA  . TOTAL KNEE ARTHROPLASTY Right 2011  . TOTAL KNEE ARTHROPLASTY Left 05/22/2014   Procedure: LEFT TOTAL KNEE ARTHROPLASTY;  Surgeon: Latanya Maudlin, MD;  Location: [facility];  Service: Orthopedics;  Laterality: Left;  . TUBAL LIGATION  1980    Social History   Socioeconomic History  . Marital status: Married    Spouse name: Not on file  . Number of children: Not on file  . Years of education:  10  . Highest education level: Not on file  Occupational History  . Not on file  Social Needs  . Financial resource strain: Not on file  . Food insecurity:    Worry: Not on file    Inability: Not on file  . Transportation needs:    Medical: Not on file    Non-medical: Not on file  Tobacco Use  . Smoking status: Former Smoker    Last  attempt to quit: 12/13/2008    Years since quitting: 8.5  . Smokeless tobacco: Never Used  Substance and Sexual Activity  . Alcohol use: No  . Drug use: No  . Sexual activity: Not on file  Lifestyle  . Physical activity:    Days per week: Not on file    Minutes per session: Not on file  . Stress: Not on file  Relationships  . Social connections:    Talks on phone: Not on file    Gets together: Not on file    Attends religious service: Not on file    Active member of club or organization: Not on file    Attends meetings of clubs or organizations: Not on file    Relationship status: Not on file  . Intimate partner violence:    Fear of current or ex partner: Not on file    Emotionally abused: Not on file    Physically abused: Not on file    Forced sexual activity: Not on file  Other Topics Concern  . Not on file  Social History Narrative   Denies any IV drug use or marijuana use.  Former smoker for 30 years, quit Nov. 2010. She is married with 2 children. Disabled from knee, not working.    Allergies  Allergen Reactions  . Gabapentin Other (See Comments)    Brain shakes  . Glucotrol [Glipizide] Rash and Other (See Comments)    Red rash  . Influenza Vaccines Other (See Comments)    Significant arm soreness requiring 1 year of physical therapy  . Latex Rash  . Milk-Related Compounds Rash and Other (See Comments)    Fever  . Nortriptyline Other (See Comments)    BRAIN SHAKE  . Tetanus Toxoid Hives, Swelling, Rash and Other (See Comments)    Swelling to site of injection with rash and fever  . Invokana [Canagliflozin] Other (See Comments)    Yeast Infections    Family History  Problem Relation Age of Onset  . CVA Mother   . Emphysema Father   . Breast cancer Sister 27    Prior to Admission medications   Medication Sig Start Date End Date Taking? Authorizing Provider  ADVAIR DISKUS 250-50 MCG/DOSE AEPB INHALE 1 PUFF INTO THE LUNGS EVERY 12 HOURS 02/02/17  Yes  [place], Modena Nunnery, MD  aspirin 81 MG tablet Take 81 mg by mouth daily.     Yes [provider]  Biotin w/ Vitamins C & E (HAIR/SKIN/NAILS PO) Take 1 tablet by mouth daily.   Yes [provider]  bisoprolol-hydrochlorothiazide [facility]) 2.5-6.25 MG tablet TAKE 1 TABLET BY MOUTH DAILY 06/14/17  Yes Susy Frizzle, MD  Blood Glucose Monitoring Suppl (BAYER CONTOUR MONITOR) w/Device KIT CHECK BLOOD SUGAR EVERY DAY 08/20/15  Yes Susy Frizzle, MD  clopidogrel (PLAVIX) 75 MG tablet Take 1 tablet (75 mg total) by mouth daily. 04/06/17  Yes Wellington Hampshire, MD  CONTOUR NEXT TEST test strip CHECK BLOOD SUGAR EVERY DAY 09/05/16  Yes Susy Frizzle, MD  diclofenac sodium (VOLTAREN) 1 % GEL Apply 2 g topically 4 (four) times daily. Patient taking differently: Apply 2 g topically 4 (four) times daily as needed (pain).  05/04/16  Yes Susy Frizzle, MD  diphenhydrAMINE (BENADRYL) 25 mg capsule Take 25 mg by mouth every 6 (six) hours as needed for allergies.   Yes [provider]  fish oil-omega-3 fatty acids 1000 MG capsule Take 2 capsules (2 g total) by mouth 2 (two) times daily. 02/03/12  Yes Larey Dresser, MD  glimepiride (AMARYL) 4 MG tablet TAKE 1 TABLET BY MOUTH EVERY DAY WITH LUNCH 04/24/17  Yes Susy Frizzle, MD  GuaiFENesin (MUCUS RELIEF PO) Take 10 mLs by mouth as needed (for congestion).   Yes [provider]  HYDROcodone-acetaminophen (NORCO/VICODIN) 5-325 MG tablet Take 1 tablet by mouth 4 (four) times daily. Patient taking differently: Take 1 tablet by mouth 4 (four) times daily as needed for moderate pain.  06/20/17  Yes Susy Frizzle, MD  isosorbide mononitrate (IMDUR) 60 MG 24 hr tablet TAKE 1 TABLET BY MOUTH DAILY 05/29/17  Yes Susy Frizzle, MD  lisinopril (PRINIVIL,ZESTRIL) 10 MG tablet TAKE 1 TABLET BY MOUTH DAILY 05/29/17  Yes Susy Frizzle, MD  metFORMIN (GLUCOPHAGE) 1000 MG tablet TAKE 1 TABLET BY MOUTH TWICE DAILY WITH MEALS 04/14/17   Yes Susy Frizzle, MD  methocarbamol (ROBAXIN) 500 MG tablet TAKE 1 TABLET BY MOUTH EVERY 6 HOURS AS NEEDED FOR MUSCLE SPASMS 06/16/17  Yes Susy Frizzle, MD  Multiple Vitamin (MULTIVITAMIN) tablet Take 1 tablet by mouth daily.     Yes [provider]  pantoprazole (PROTONIX) 40 MG tablet TAKE 1 TABLET BY MOUTH DAILY 04/03/17  Yes Susy Frizzle, MD  pioglitazone (ACTOS) 30 MG tablet TAKE 1 TABLET(30 MG) BY MOUTH DAILY 12/26/16  Yes Susy Frizzle, MD  Probiotic Product (TRUBIOTICS PO) Take 1 capsule by mouth daily.   Yes [provider]  rosuvastatin (CRESTOR) 40 MG tablet TAKE 1 TABLET BY MOUTH DAILY 06/20/17  Yes Pickard, Cammie Mcgee, MD  VENTOLIN HFA 108 (90 Base) MCG/ACT inhaler INHALE 2 PUFFS BY MOUTH TWICE DAILY AS NEEDED FOR WHEEZING OR SHORTNESS OF BREATH 08/16/16  Yes Susy Frizzle, MD  ondansetron (ZOFRAN) 4 MG tablet TAKE 1 TABLET(4 MG) BY MOUTH EVERY 8 HOURS AS NEEDED FOR NAUSEA OR VOMITING 06/27/17   Susy Frizzle, MD  ondansetron (ZOFRAN) 4 MG tablet TAKE 1 TABLET(4 MG) BY MOUTH EVERY 8 HOURS AS NEEDED FOR NAUSEA OR VOMITING 06/27/17   Susy Frizzle, MD    Physical Exam: Vitals:   06/27/17 0745 06/27/17 0800 06/27/17 0820 06/27/17 0822  BP: 111/62 128/66 126/78   Pulse: 83 82 88   Resp: (!) 22 13    Temp:    98.1 F (36.7 C)  TempSrc:    Oral  SpO2: 95% 98% 98%      General:  Appears calm and comfortable sitting up in bed in no acute distress Eyes:  PERRL, EOMI, normal lids, iris ENT:  grossly normal hearing, lips & tongue, mucous membranes of her mouth are slightly dry but pink Neck:  no LAD, masses or thyromegaly Cardiovascular:  RRR, no m/r/g. No LE edema.  Respiratory:  CTA bilaterally, no w/r/r. Normal respiratory effort. Abdomen:  soft,  Obese diffuse tenderness to palpation particularly in upper quadrant and epigastric area. No guarding or rebounding sluggish bowel sounds Skin:  no rash or induration seen on limited  exam Musculoskeletal:  grossly normal tone BUE/BLE, good ROM, no bony abnormality Psychiatric:  grossly normal mood and affect, speech fluent and appropriate, AOx3 Neurologic:  CN 2-12 grossly intact, moves all extremities in coordinated fashion, sensation intact  Labs on Admission: I have personally reviewed following labs and imaging studies  CBC: Recent Labs  Lab 06/26/17 2125  WBC 8.0  HGB 12.4  HCT 39.4  MCV 89.5  PLT 937   Basic Metabolic Panel: Recent Labs  Lab 06/26/17 2125  NA 140  K 4.1  CL 102  CO2 25  GLUCOSE 85  BUN 24*  CREATININE 1.28*  CALCIUM 10.0   GFR: CrCl cannot be calculated (Unknown ideal weight.). Liver Function Tests: Recent Labs  Lab 06/27/17 0605  AST 18  ALT 14  ALKPHOS 69  BILITOT 0.5  PROT 7.1  ALBUMIN 3.7   Recent Labs  Lab 06/26/17 2125  LIPASE 192*   No results for input(s): AMMONIA in the last 168 hours. Coagulation Profile: No results for input(s): INR, PROTIME in the last 168 hours. Cardiac Enzymes: Recent Labs  Lab 06/27/17 0605  TROPONINI <0.03   BNP (last 3 results) No results for input(s): PROBNP in the last 8760 hours. HbA1C: No results for input(s): HGBA1C in the last 72 hours. CBG: No results for input(s): GLUCAP in the last 168 hours. Lipid Profile: Recent Labs    06/27/17 0605  TRIG 147   Thyroid Function Tests: No results for input(s): TSH, T4TOTAL, FREET4, T3FREE, THYROIDAB in the last 72 hours. Anemia Panel: No results for input(s): VITAMINB12, FOLATE, FERRITIN, TIBC, IRON, RETICCTPCT in the last 72 hours. Urine analysis:    Component Value Date/Time   COLORURINE YELLOW 06/26/2017 2135   APPEARANCEUR HAZY (A) 06/26/2017 2135   LABSPEC 1.011 06/26/2017 2135   PHURINE 5.0 06/26/2017 2135   GLUCOSEU NEGATIVE 06/26/2017 2135   HGBUR SMALL (A) 06/26/2017 2135   BILIRUBINUR NEGATIVE 06/26/2017 2135   KETONESUR NEGATIVE 06/26/2017 2135   PROTEINUR NEGATIVE 06/26/2017 2135   UROBILINOGEN 0.2  05/13/2014 1250   NITRITE POSITIVE (A) 06/26/2017 2135   LEUKOCYTESUR NEGATIVE 06/26/2017 2135    Creatinine Clearance: CrCl cannot be calculated (Unknown ideal weight.).  Sepsis Labs: @LABRCNTIP (procalcitonin:4,lacticidven:4) )No results found for this or any previous visit (from the past 240 hour(s)).   Radiological Exams on Admission: Dg Chest 2 View  Result Date: 06/26/2017 CLINICAL DATA:  Pt states she has a history of pancreatitis and this feels similar with side and abdominal pain but also has a component of chest pain/sharp/pressure radiating to back. Constant. Vomiting and nausea present. Cannot tolerate po without increased pain. EXAM: CHEST - 2 VIEW COMPARISON:  Chest x-ray dated 05/13/2014. FINDINGS: The heart size and mediastinal contours are within normal limits. Aortic atherosclerosis noted. Both lungs are clear. Mild degenerative spurring within the slightly kyphotic thoracic spine. No acute or suspicious osseous finding. IMPRESSION: No active cardiopulmonary disease. No evidence of pneumonia or pulmonary edema. Electronically Signed   By: Franki Cabot M.D.   On: 06/26/2017 21:36   Ct Abdomen Pelvis W Contrast  Result Date: 06/27/2017 CLINICAL DATA:  62 y/o F; abdominal pain with history of pancreatitis. EXAM: CT ABDOMEN AND PELVIS WITH CONTRAST TECHNIQUE: Multidetector CT imaging of the abdomen and pelvis was performed using the standard protocol following bolus administration of intravenous contrast. CONTRAST:  41m OMNIPAQUE IOHEXOL 300 MG/ML  SOLN COMPARISON:  01/28/2013 CT abdomen and pelvis. FINDINGS: Lower chest: Severe coronary artery calcifications. Hepatobiliary: No focal liver abnormality is seen. No gallstones, gallbladder  wall thickening, or biliary dilatation. Pancreas: Extensive edema surrounding the pancreatic head, first and second segments of the duodenum, and within the mesentery compatible with acute pancreatitis. Multilobulated cyst between the left lobe of  the liver and the pancreas measuring up to 19 mm. Calcification within the pancreas compatible sequelae of chronic pancreatitis. Spleen: Normal in size without focal abnormality. Adrenals/Urinary Tract: Normal adrenal glands. Multiple cysts in the kidneys measuring up to 12 mm in the right upper pole. No hydronephrosis. Normal bladder. Stomach/Bowel: Stomach is within normal limits. Appendix appears normal. No evidence of bowel wall thickening, distention, or inflammatory changes. Mild sigmoid diverticulosis, no findings of acute diverticulitis. Vascular/Lymphatic: Aortic atherosclerosis. No enlarged abdominal or pelvic lymph nodes. Reproductive: Uterus and bilateral adnexa are unremarkable. Other: No abdominal wall hernia or abnormality. No abdominopelvic ascites. Musculoskeletal: No fracture is seen. IMPRESSION: 1. Acute on chronic pancreatitis. No findings of necrosis. 18 mm peripancreatic collection between the liver and head of pancreas. 2. Mild sigmoid diverticulosis, no findings of acute diverticulitis. 3. Aortic atherosclerosis. Electronically Signed   By: Kristine Garbe M.D.   On: 06/27/2017 05:16   US Abdomen Limited Ruq  Result Date: 06/27/2017 CLINICAL DATA:  62 y/o  F; epigastric abdominal pain. EXAM: ULTRASOUND ABDOMEN LIMITED RIGHT UPPER QUADRANT COMPARISON:  03/26/2009 CT abdomen and pelvis. FINDINGS: Gallbladder: No gallstones or wall thickening visualized. No sonographic Murphy sign noted by sonographer. Common bile duct: Diameter: 5.9 mm Liver: No focal lesion identified. Within normal limits in parenchymal echogenicity. Portal vein is patent on color Doppler imaging with normal direction of blood flow towards the liver. IMPRESSION: No acute process identified. Unremarkable right upper quadrant ultrasound. Electronically Signed   By: Kristine Garbe M.D.   On: 06/27/2017 04:38    EKG: Independently reviewed. Normal sinus rhythm Low voltage  QRS  Assessment/Plan Principal Problem:   Acute on chronic pancreatitis (HCC) Active Problems:   TOBACCO ABUSE   Essential hypertension   PAD (peripheral artery disease) (HCC)   GERD (gastroesophageal reflux disease)   Renal artery stenosis ([facility])   CAD (coronary artery disease)   Diabetes mellitus with circulatory complication ([place])   #1. Acute on chronic pancreatitis. Lipase 192.abdominal ultrasound unremarkable. CT scan of the abdomen consistent with acute on chronic pancreatitis. No evidence of necrosis. -Admit -Vigorous IV fluids -nothing by mouth -Supportive therapy -Monitor  #2. CAD. Patient with epigastric abdominal pain associated nausea and vomiting EKG as noted above. Troponin within the limits of normal.Chest x-ray without active cardiopulmonary disease. Home medications include aspirin, Plavix, Imdur -Monitor -Continue aspirin Plavix and Imdur -hold Crestor for now -of note she is not had aspirin or Plavix for 5 days in anticipation of upcoming foot surgery  #3. Diabetes.serum glucose 85. Home medications include oral agents -Hold home meds for now -Obtain a hemoglobin A1c -Sliding scale insulin for optimal control  #4. Hypertension. Controlled in the emergency department. Home medications include lisinopril -Holding lisinopril for now -monitor -resume when indicated  #5. Tobacco use -cessation counseling   DVT prophylaxis: plavix asa  Code Status: full  Family Communication: none  Disposition Plan: home  Consults called: none  Admission status: inpateint    Radene Gunning MD [facility]  If 7PM-7AM, please contact night-coverage [email or web address] Password [email or web address]  06/27/2017, 9:07 AM

## 2017-06-28 ENCOUNTER — Other Ambulatory Visit: Payer: Self-pay

## 2017-06-28 DIAGNOSIS — I10 Essential (primary) hypertension: Secondary | ICD-10-CM

## 2017-06-28 DIAGNOSIS — I701 Atherosclerosis of renal artery: Secondary | ICD-10-CM

## 2017-06-28 DIAGNOSIS — E1159 Type 2 diabetes mellitus with other circulatory complications: Secondary | ICD-10-CM

## 2017-06-28 DIAGNOSIS — I739 Peripheral vascular disease, unspecified: Secondary | ICD-10-CM

## 2017-06-28 LAB — LIPASE, BLOOD: LIPASE: 89 U/L — AB (ref 11–51)

## 2017-06-28 LAB — GLUCOSE, CAPILLARY
Glucose-Capillary: 107 mg/dL — ABNORMAL HIGH (ref 65–99)
Glucose-Capillary: 120 mg/dL — ABNORMAL HIGH (ref 65–99)
Glucose-Capillary: 102 mg/dL — ABNORMAL HIGH (ref 65–99)
Glucose-Capillary: 128 mg/dL — ABNORMAL HIGH (ref 65–99)

## 2017-06-28 LAB — COMPREHENSIVE METABOLIC PANEL
ALT: 12 U/L — ABNORMAL LOW (ref 14–54)
ANION GAP: 11 (ref 5–15)
AST: 15 U/L (ref 15–41)
Albumin: 3.1 g/dL — ABNORMAL LOW (ref 3.5–5.0)
Alkaline Phosphatase: 60 U/L (ref 38–126)
BILIRUBIN TOTAL: 0.5 mg/dL (ref 0.3–1.2)
BUN: 11 mg/dL (ref 6–20)
CO2: 24 mmol/L (ref 22–32)
Calcium: 9 mg/dL (ref 8.9–10.3)
Chloride: 103 mmol/L (ref 101–111)
Creatinine, Ser: 1.08 mg/dL — ABNORMAL HIGH (ref 0.44–1.00)
GFR calc Af Amer: >60 mL/min (ref >60)
GFR, EST NON AFRICAN AMERICAN: 55 mL/min — AB (ref >60)
Glucose, Bld: 104 mg/dL — ABNORMAL HIGH (ref 65–99)
POTASSIUM: 3.9 mmol/L (ref 3.5–5.1)
Sodium: 138 mmol/L (ref 135–145)
Total Protein: 6 g/dL — ABNORMAL LOW (ref 6.5–8.1)

## 2017-06-28 MED ORDER — ENOXAPARIN SODIUM 30 MG/0.3ML ~~LOC~~ SOLN: 30.0000 mg | SUBCUTANEOUS | Status: DC

## 2017-06-28 MED ORDER — ENOXAPARIN SODIUM 40 MG/0.4ML ~~LOC~~ SOLN
40.0000 mg | SUBCUTANEOUS | Status: DC
  Administered 2017-06-28 – 2017-06-29 (×2): 40 mg via SUBCUTANEOUS
  Filled 2017-06-28 (×2): qty 0.4

## 2017-06-28 MED ORDER — ONDANSETRON HCL 40 MG/20ML IJ SOLN
8.0000 mg | Freq: Three times a day (TID) | INTRAMUSCULAR | Status: DC | PRN
  Filled 2017-06-28 (×2): qty 4

## 2017-06-28 MED ORDER — ROSUVASTATIN CALCIUM 40 MG PO TABS
40.0000 mg | ORAL_TABLET | Freq: Every day | ORAL | Status: DC
  Administered 2017-06-28 – 2017-06-29 (×2): 40 mg via ORAL
  Filled 2017-06-28 (×2): qty 1

## 2017-06-28 MED ORDER — PROMETHAZINE HCL 25 MG PO TABS: 25.0000 mg | ORAL_TABLET | Freq: Four times a day (QID) | ORAL | Status: DC | PRN

## 2017-06-28 MED ORDER — PROMETHAZINE HCL 25 MG RE SUPP
25.0000 mg | Freq: Four times a day (QID) | RECTAL | Status: DC | PRN
  Filled 2017-06-28: qty 1

## 2017-06-28 MED ORDER — SODIUM CHLORIDE 0.9 % IV SOLN
INTRAVENOUS | Status: DC
  Administered 2017-06-28 – 2017-06-30 (×4): via INTRAVENOUS

## 2017-06-28 MED ORDER — PROMETHAZINE HCL 25 MG/ML IJ SOLN
12.5000 mg | Freq: Four times a day (QID) | INTRAMUSCULAR | Status: DC | PRN
  Administered 2017-06-28 (×2): 12.5 mg via INTRAVENOUS
  Filled 2017-06-28 (×2): qty 1

## 2017-06-28 NOTE — Plan of Care (Signed)
  Problem: Activity: Goal: Risk for activity intolerance will decrease Outcome: Progressing   Problem: Safety: Goal: Ability to remain free from injury will improve Outcome: Progressing   

## 2017-06-28 NOTE — Progress Notes (Signed)
Attempted to share Advanced Directive information with patient x2.  Will continue to follow and share information with her and get it completed.     06/28/17 1618  Clinical Encounter Type  Visited With Health care provider;Patient not available;Patient  Visit Type Initial;Spiritual support

## 2017-06-28 NOTE — Progress Notes (Signed)
PROGRESS NOTE    [name]  NWG:[id number] DOB: Jan 30, 1956 DOA: 06/26/2017 PCP: Donita Brooks, MD      Brief Narrative:  Mrs. Fletes is a 62 y.o. F with COPD not on home O2, CAD ,DM, HTN, and chronic pancreatitis who presents with epigastric pain.   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  This was discussed with Radiology who felt the significance was unclear, but that: -Pancreatic protocol CT in 3-6 months to reimage would not be unreasonable in this patient with risk factors (age, smoking, chronic pancreatitis).  Coronary disease Hypertension -Hold home lisinopril, BB, HCTZ -Continue Imdur -Resume aspirin, Plavix, statin  Diabetes -Hold metformin and glimepiride and pioglitazone -SSI with meals  Tobacco use Patient states she has been off tobacco for 8 years  COPD No active disease. -Continue Advair as a formulary alternative Dulera       DVT prophylaxis: Lovenox Code Status: FULL Family Communication: Husband at bedside MDM and disposition Plan: The below labs and imaging reports were reviewed.  The patient's status is clinically stable.     Consultants:   None  Procedures:   None  Antimicrobials:   None    Subjective: Feeling hungry.  Nausea intermittently, rare vomiting.  Pain improved with IV pain medicine.  No confusion, dizziness, weaeknes.    Objective: Vitals:   06/28/17 0049 06/28/17 0523 06/28/17 0815 06/28/17 1155  BP: 139/64 114/71  (!) 129/56  Pulse: 97 90  96  Resp: Temp: 98.4 F (36.9 C) (!) 97.5 F (36.4 C)  98.2 F (36.8 C)  TempSrc: Oral Oral  Oral  SpO2: 94% 94% 95% 98%  Weight:  86.3 kg (190 lb 3.2 oz)    Height:        Intake/Output  Summary (Last 24 hours) at 06/28/2017 1515 Last data filed at 06/28/2017 0500 Gross per 24 hour  Intake 2534.17 ml  Output 300 ml  Net 2234.17 ml   Filed Weights   06/27/17 2051 06/28/17 0523  Weight: 87.3 kg (192 lb 6.4 oz) 86.3 kg (190 lb 3.2 oz)    Examination: General appearance: obese adult female, alert and in no acute distress.   HEENT: Anicteric, conjunctiva pink, lids and lashes normal. No nasal deformity, discharge, epistaxis.  Lips dry, teeth normal, OP tacky, no oral lesions.   Skin: Warm and dry.  no jaundice.  No suspicious rashes or lesions. Cardiac: RRR, nl S1-S2, no murmurs appreciated.  Capillary refill is brisk.  JVP not visible.  No LE edema.  Radial pulses 2+ and symmetric. Respiratory: Normal respiratory rate and rhythm.  CTAB without rales or wheezes. Abdomen: Abdomen soft.  Moderat TTP with voluntary guarding. No ascites, distension, hepatosplenomegaly.   MSK: No deformities or effusions. Neuro: Awake and alert.  EOMI, moves all extremities. Speech fluent.    Psych: Sensorium intact and responding to questions, attention normal. Affect noraml.  Judgment and insight appear normal.    Data Reviewed: I have personally reviewed following labs and imaging studies:  CBC: Recent Labs  Lab 06/26/17 2125  WBC 8.0  HGB 12.4  HCT 39.4  MCV 89.5  PLT 330   Basic Metabolic Panel: Recent Labs  Lab 06/26/17 2125 06/28/17 0515  NA 140 138  K 4.1 3.9  CL 102 103  CO2 25 24  GLUCOSE 85 104*  BUN 24* 11  CREATININE 1.28* 1.08*  CALCIUM 10.0 9.0   GFR: Estimated Creatinine Clearance: 58.9 mL/min (A) (by C-G formula based on SCr of 1.08 mg/dL (H)). Liver Function Tests: Recent Labs  Lab 06/27/17 0605 06/28/17 0515  AST 18 15  ALT 14 12*  ALKPHOS 69 60  BILITOT 0.5 0.5  PROT 7.1 6.0*  ALBUMIN 3.7 3.1*   Recent Labs  Lab 06/26/17 2125 06/28/17 0515  LIPASE 192* 89*   No results for input(s): AMMONIA in the last 168 hours. Coagulation Profile: No  results for input(s): INR, PROTIME in the last 168 hours. Cardiac Enzymes: Recent Labs  Lab 06/27/17 0605  TROPONINI <0.03   BNP (last 3 results) No results for input(s): PROBNP in the last 8760 hours. HbA1C: Recent Labs    06/26/17 2123  HGBA1C 6.7*   CBG: Recent Labs  Lab 06/27/17 1213 06/27/17 1635 06/27/17 2057 06/28/17 0741 06/28/17 1125  GLUCAP 100* 128* 138* 107* 120*   Lipid Profile: Recent Labs    06/27/17 0605  TRIG 147   Thyroid Function Tests: No results for input(s): TSH, T4TOTAL, FREET4, T3FREE, THYROIDAB in the last 72 hours. Anemia Panel: No results for input(s): VITAMINB12, FOLATE, FERRITIN, TIBC, IRON, RETICCTPCT in the last 72 hours. Urine analysis:    Component Value Date/Time   COLORURINE YELLOW 06/26/2017 2135   APPEARANCEUR HAZY (A) 06/26/2017 2135   LABSPEC 1.011 06/26/2017 2135   PHURINE 5.0 06/26/2017 2135   GLUCOSEU NEGATIVE 06/26/2017 2135   HGBUR SMALL (A) 06/26/2017 2135   BILIRUBINUR NEGATIVE 06/26/2017 2135   KETONESUR NEGATIVE 06/26/2017 2135   PROTEINUR NEGATIVE 06/26/2017 2135   UROBILINOGEN 0.2 05/13/2014 1250   NITRITE POSITIVE (A) 06/26/2017 2135   LEUKOCYTESUR NEGATIVE 06/26/2017 2135   Sepsis Labs: (procalcitonin:4,lacticacidven:4)  )No results found for this or any previous visit (from the past 240 hour(s)).       Radiology Studies: Dg Chest 2 View  Result Date: 06/26/2017 CLINICAL DATA:  Pt states she has a history of pancreatitis and this feels similar with side and abdominal pain but also has a component of chest pain/sharp/pressure radiating to back. Constant. Vomiting and nausea present. Cannot tolerate po without increased pain. EXAM: CHEST - 2 VIEW COMPARISON:  Chest x-ray dated 05/13/2014. FINDINGS: The heart size and mediastinal contours are within normal limits. Aortic atherosclerosis noted. Both lungs are clear. Mild degenerative spurring within the slightly kyphotic thoracic spine. No acute  or suspicious osseous finding. IMPRESSION: No active cardiopulmonary disease. No evidence of pneumonia or pulmonary edema. Electronically Signed   By: Bary Richard M.D.   On: 06/26/2017 21:36   Ct Abdomen Pelvis W Contrast  Result Date: 06/27/2017 CLINICAL DATA:  62 y/o F; abdominal pain with history of pancreatitis. EXAM: CT ABDOMEN AND PELVIS WITH CONTRAST TECHNIQUE: Multidetector CT imaging of the abdomen and pelvis was performed using the standard protocol following bolus administration of intravenous contrast. CONTRAST:  80mL OMNIPAQUE IOHEXOL 300 MG/ML  SOLN COMPARISON:  01/28/2013 CT abdomen and pelvis. FINDINGS: Lower chest: Severe coronary artery calcifications. Hepatobiliary: No focal liver abnormality is seen. No gallstones, gallbladder wall thickening, or biliary dilatation. Pancreas: Extensive edema surrounding the pancreatic head, first and second segments of the duodenum, and within the mesentery compatible with acute pancreatitis. Multilobulated cyst between the left lobe of the liver and the pancreas measuring up to 19 mm. Calcification within the pancreas compatible sequelae of  chronic pancreatitis. Spleen: Normal in size without focal abnormality. Adrenals/Urinary Tract: Normal adrenal glands. Multiple cysts in the kidneys measuring up to 12 mm in the right upper pole. No hydronephrosis. Normal bladder. Stomach/Bowel: Stomach is within normal limits. Appendix appears normal. No evidence of bowel wall thickening, distention, or inflammatory changes. Mild sigmoid diverticulosis, no findings of acute diverticulitis. Vascular/Lymphatic: Aortic atherosclerosis. No enlarged abdominal or pelvic lymph nodes. Reproductive: Uterus and bilateral adnexa are unremarkable. Other: No abdominal wall hernia or abnormality. No abdominopelvic ascites. Musculoskeletal: No fracture is seen. IMPRESSION: 1. Acute on chronic pancreatitis. No findings of necrosis. 18 mm peripancreatic collection between the liver and  head of pancreas. 2. Mild sigmoid diverticulosis, no findings of acute diverticulitis. 3. Aortic atherosclerosis. Electronically Signed   By: Mitzi Hansen M.D.   On: 06/27/2017 05:16   US Abdomen Limited Ruq  Result Date: 06/27/2017 CLINICAL DATA:  62 y/o  F; epigastric abdominal pain. EXAM: ULTRASOUND ABDOMEN LIMITED RIGHT UPPER QUADRANT COMPARISON:  03/26/2009 CT abdomen and pelvis. FINDINGS: Gallbladder: No gallstones or wall thickening visualized. No sonographic Murphy sign noted by sonographer. Common bile duct: Diameter: 5.9 mm Liver: No focal lesion identified. Within normal limits in parenchymal echogenicity. Portal vein is patent on color Doppler imaging with normal direction of blood flow towards the liver. IMPRESSION: No acute process identified. Unremarkable right upper quadrant ultrasound. Electronically Signed   By: Mitzi Hansen M.D.   On: 06/27/2017 04:38        Scheduled Meds: . clopidogrel  75 mg Oral Daily  . insulin aspart  0-5 Units Subcutaneous QHS  . insulin aspart  0-9 Units Subcutaneous TID WC  . isosorbide mononitrate  60 mg Oral Daily  . mometasone-formoterol  2 puff Inhalation BID   Continuous Infusions: . ondansetron (ZOFRAN) IV       LOS: 1 day    Time spent: 25 minutes    Alberteen Sam, MD [facility] 06/28/2017, 11:30 AM     Pager [phone] --- please page though AMION:  [email or web address] Password [email or web address] If 7PM-7AM, please contact night-coverage

## 2017-06-28 NOTE — Plan of Care (Signed)
  Problem: Activity: Goal: Risk for activity intolerance will decrease Outcome: Progressing   Problem: Pain Managment: Goal: General experience of comfort will improve Outcome: Progressing   Problem: Safety: Goal: Ability to remain free from injury will improve Outcome: Progressing   

## 2017-06-29 DIAGNOSIS — K861 Other chronic pancreatitis: Secondary | ICD-10-CM

## 2017-06-29 DIAGNOSIS — K859 Acute pancreatitis without necrosis or infection, unspecified: Secondary | ICD-10-CM

## 2017-06-29 LAB — COMPREHENSIVE METABOLIC PANEL
ALBUMIN: 3 g/dL — AB (ref 3.5–5.0)
ALK PHOS: 58 U/L (ref 38–126)
ALT: 11 U/L — AB (ref 14–54)
AST: 14 U/L — ABNORMAL LOW (ref 15–41)
Anion gap: 12 (ref 5–15)
BILIRUBIN TOTAL: 0.8 mg/dL (ref 0.3–1.2)
BUN: 9 mg/dL (ref 6–20)
CALCIUM: 8.6 mg/dL — AB (ref 8.9–10.3)
CO2: 24 mmol/L (ref 22–32)
CREATININE: 0.94 mg/dL (ref 0.44–1.00)
Chloride: 103 mmol/L (ref 101–111)
GFR calc Af Amer: >60 mL/min (ref >60)
GFR calc non Af Amer: >60 mL/min (ref >60)
GLUCOSE: 105 mg/dL — AB (ref 65–99)
Potassium: 3.4 mmol/L — ABNORMAL LOW (ref 3.5–5.1)
SODIUM: 139 mmol/L (ref 135–145)
Total Protein: 5.9 g/dL — ABNORMAL LOW (ref 6.5–8.1)

## 2017-06-29 LAB — CBC
HCT: 33.7 % — ABNORMAL LOW (ref 36.0–46.0)
Hemoglobin: 10.4 g/dL — ABNORMAL LOW (ref 12.0–15.0)
MCH: 27.5 pg (ref 26.0–34.0)
MCHC: 30.9 g/dL (ref 30.0–36.0)
MCV: 89.2 fL (ref 78.0–100.0)
PLATELETS: 301 10*3/uL (ref 150–400)
RBC: 3.78 MIL/uL — ABNORMAL LOW (ref 3.87–5.11)
RDW: 14.2 % (ref 11.5–15.5)
WBC: 5.1 10*3/uL (ref 4.0–10.5)

## 2017-06-29 LAB — GLUCOSE, CAPILLARY
Glucose-Capillary: 99 mg/dL (ref 65–99)
Glucose-Capillary: 134 mg/dL — ABNORMAL HIGH (ref 65–99)
Glucose-Capillary: 128 mg/dL — ABNORMAL HIGH (ref 65–99)
Glucose-Capillary: 144 mg/dL — ABNORMAL HIGH (ref 65–99)

## 2017-06-29 NOTE — Progress Notes (Signed)
PROGRESS NOTE    [name]  NWG:[id number] DOB: 17-Nov-1955 DOA: 06/26/2017 PCP: Donita Brooks, MD  Brief Narrative:62 y.o. F with COPD not on home O2, CAD ,DM, HTN, and chronic pancreatitis who presents with epigastric pain   Assessment & Plan:   Principal Problem:   Acute on chronic pancreatitis (HCC) Active Problems:   TOBACCO ABUSE   Essential hypertension   PAD (peripheral artery disease) (HCC)   GERD (gastroesophageal reflux disease)   Renal artery stenosis (HCC)   CAD (coronary artery disease)   Diabetes mellitus with circulatory complication (HCC)  Acute on chronic pancreatitis -Continue clear liquids today advance diet starting tomorrow. 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  This was discussed with Radiology who felt the significance was unclear, but that: -Pancreatic protocol CT in 3-6 months to reimage would not be unreasonable in this patient with risk factors (age, smoking, chronic pancreatitis).  Coronary disease Hypertension -Hold home lisinopril, BB, HCTZ -Continue Imdur -Resume aspirin, Plavix, statin  Diabetes -Hold metformin and glimepiride and pioglitazone -SSI with meals  Tobacco use Patient states she has been off tobacco for 8 years  COPD  No active disease. -Continue Advair as a formulary alternative Dulera      DVT prophylaxis: Lovenox Code Status full code  family Communication: No family available  disposition Plan: TBD Consultants:  None Procedures: None Antimicrobials:  None Subjective: Patient resting in bed able to talk in full sentences denies any specific complaints.  Pain better but last vomited last p.m. 7 PM.   Objective: Vitals:   06/28/17 1155 06/28/17 2014 06/28/17 2100 06/29/17 0626  BP: (!) 129/56   127/61 115/79  Pulse: 96 (!) 130 95 (!) 103  Resp: Temp: 98.2 F (36.8 C)  98.1 F (36.7 C) 98.2 F (36.8 C)  TempSrc: Oral  Oral Oral  SpO2: 98% 96% 97% 98%  Weight:    86.9 kg (191 lb 8 oz)  Height:        Intake/Output Summary (Last 24 hours) at 06/29/2017 0848 Last data filed at 06/29/2017 5784 Gross per 24 hour  Intake 2501.67 ml  Output 900 ml  Net 1601.67 ml   Filed Weights   06/27/17 2051 06/28/17 0523 06/29/17 0626  Weight: 87.3 kg (192 lb 6.4 oz) 86.3 kg (190 lb 3.2 oz) 86.9 kg (191 lb 8 oz)    Examination:  General exam: Appears calm and comfortable  Respiratory system: Clear to auscultation. Respiratory effort normal. Cardiovascular system: S1 & S2 heard, RRR. No JVD, murmurs, rubs, gallops or clicks. No pedal edema. Gastrointestinal system: Abdomen is nondistended, soft and nontender. No organomegaly or masses felt. Normal bowel sounds heard. Central nervous system: Alert and oriented. No focal neurological deficits. Extremities: Symmetric 5 x 5 power. Skin: No rashes, lesions or ulcers Psychiatry: Judgement and insight appear normal. Mood & affect appropriate.     Data Reviewed: I have personally reviewed following labs and imaging studies  CBC: Recent Labs  Lab 06/26/17 2125 06/29/17 0723  WBC 8.0 5.1  HGB 12.4 10.4*  HCT 39.4 33.7*  MCV 89.5 89.2  PLT 330 301   Basic Metabolic Panel: Recent Labs  Lab 06/26/17 2125 06/28/17 0515  NA 140 138  K 4.1 3.9  CL 102 103  CO2 25 24  GLUCOSE 85 104*  BUN 24* 11  CREATININE 1.28* 1.08*  CALCIUM 10.0 9.0   GFR: Estimated Creatinine Clearance: 59.1 mL/min (A) (by C-G formula based on SCr of 1.08 mg/dL (H)). Liver Function Tests: Recent Labs  Lab 06/27/17 0605 06/28/17 0515  AST 18 15  ALT 14 12*  ALKPHOS 69 60  BILITOT 0.5 0.5  PROT 7.1 6.0*  ALBUMIN 3.7 3.1*   Recent Labs  Lab 06/26/17 2125 06/28/17 0515  LIPASE 192* 89*   No results for input(s): AMMONIA in the last  168 hours. Coagulation Profile: No results for input(s): INR, PROTIME in the last 168 hours. Cardiac Enzymes: Recent Labs  Lab 06/27/17 0605  TROPONINI <0.03   BNP (last 3 results) No results for input(s): PROBNP in the last 8760 hours. HbA1C: Recent Labs    06/26/17 2123  HGBA1C 6.7*   CBG: Recent Labs  Lab 06/28/17 0741 06/28/17 1125 06/28/17 1632 06/28/17 2104 06/29/17 0737  GLUCAP 107* 120* 102* 128* 99   Lipid Profile: Recent Labs    06/27/17 0605  TRIG 147   Thyroid Function Tests: No results for input(s): TSH, T4TOTAL, FREET4, T3FREE, THYROIDAB in the last 72 hours. Anemia Panel: No results for input(s): VITAMINB12, FOLATE, FERRITIN, TIBC, IRON, RETICCTPCT in the last 72 hours. Sepsis Labs: No results for input(s): PROCALCITON, LATICACIDVEN in the last 168 hours.  No results found for this or any previous visit (from the past 240 hour(s)).       Radiology Studies: No results found.      Scheduled Meds: . clopidogrel  75 mg Oral Daily  . enoxaparin (LOVENOX) injection  40 mg Subcutaneous Q24H  . insulin aspart  0-5 Units Subcutaneous QHS  . insulin aspart  0-9 Units Subcutaneous TID WC  . isosorbide mononitrate  60 mg Oral Daily  . mometasone-formoterol  2 puff Inhalation BID  . rosuvastatin  40 mg Oral q1800   Continuous Infusions: . sodium chloride 100 mL/hr at 06/28/17 1711  . ondansetron (ZOFRAN) IV       LOS: 2 days     Alwyn Ren, MD [facility] If 7PM-7AM, please contact night-coverage [email or web address] Password [email or web address] 06/29/2017, 8:48 AM

## 2017-06-30 LAB — GLUCOSE, CAPILLARY
Glucose-Capillary: 107 mg/dL — ABNORMAL HIGH (ref 65–99)
Glucose-Capillary: 134 mg/dL — ABNORMAL HIGH (ref 65–99)

## 2017-06-30 LAB — BASIC METABOLIC PANEL
Anion gap: 10 (ref 5–15)
BUN: 6 mg/dL (ref 6–20)
CALCIUM: 8.5 mg/dL — AB (ref 8.9–10.3)
CHLORIDE: 106 mmol/L (ref 101–111)
CO2: 23 mmol/L (ref 22–32)
Creatinine, Ser: 0.95 mg/dL (ref 0.44–1.00)
GFR calc Af Amer: >60 mL/min (ref >60)
GLUCOSE: 116 mg/dL — AB (ref 65–99)
POTASSIUM: 3.5 mmol/L (ref 3.5–5.1)
Sodium: 139 mmol/L (ref 135–145)

## 2017-06-30 LAB — LIPASE, BLOOD: LIPASE: 54 U/L — AB (ref 11–51)

## 2017-06-30 NOTE — Discharge Summary (Signed)
Physician Discharge Summary  [name] DHR:[id number] DOB: [birth year] DOA: 06/26/2017  PCP: Susy Frizzle, MD  Admit date: 06/26/2017 Discharge date: 06/30/2017  Admitted From home Disposition: home  Recommendations for Outpatient Follow-up:  1. Follow up with PCP in 1-2 weeks 2. Please obtain BMP/CBC in one week  Home Health none Equipment/Devices: None Discharge Condition: Stable  CODE STATUS full Diet recommendation carb modified Brief/Interim Summary::62 y.o.Fwith COPD not on home O2, CAD ,DM, HTN, and chronic pancreatitis who presents with epigastric pain   Discharge Diagnoses:  Principal Problem:   Acute on chronic pancreatitis ([name]) Active Problems:   TOBACCO ABUSE   Essential hypertension   PAD (peripheral artery disease) (HCC)   GERD (gastroesophageal reflux disease)   Renal artery stenosis (HCC)   CAD (coronary artery disease)   Diabetes mellitus with circulatory complication (HCC)  Acute on chronic pancreatitis-patient was treated with n.p.o. IV hydration and pain control.  Her symptoms improved with the above treatment.  He was started on clear liquid diet diet was advanced to regular diet she was able to tolerate the regular diet on the day of discharge.   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 This was discussed with Radiology who felt the significance was unclear, but that: -Pancreatic protocol CT in 3-6 months to reimage would not be unreasonable in this patient with risk factors (age, smoking, chronic pancreatitis).  Coronary disease Hypertension -Hold home lisinopril, BB, HCTZ -Continue Imdur -Resume aspirin, Plavix, statin  Diabetes -Hold metformin and glimepiride and pioglitazone -SSI with meals  Tobacco use Patient states she has been off tobacco for 8 years  COPD  No active disease. -Continue Advair as a formulary alternative  [facility]      Discharge Instructions  Discharge Instructions    Call MD for:  difficulty breathing, headache or visual disturbances   Complete by:  As directed    Call MD for:  persistant nausea and vomiting   Complete by:  As directed    Call MD for:  temperature >100.4   Complete by:  As directed    Diet - low sodium heart healthy   Complete by:  As directed    Increase activity slowly   Complete by:  As directed      Allergies as of 06/30/2017      Reactions   Gabapentin Other (See Comments)   Brain shakes   Glucotrol [glipizide] Rash, Other (See Comments)   Red rash   Influenza Vaccines Other (See Comments)   Significant arm soreness requiring 1 year of physical therapy   Latex Rash   Milk-related Compounds Rash, Other (See Comments)   Fever   Nortriptyline Other (See Comments)   BRAIN SHAKE   Tetanus Toxoid Hives, Swelling, Rash, Other (See Comments)   Swelling to site of injection with rash and fever   Invokana [canagliflozin] Other (See Comments)   Yeast Infections      Medication List    STOP taking these medications   diphenhydrAMINE 25 mg capsule Commonly known as:  BENADRYL     TAKE these medications   ADVAIR DISKUS 250-50 MCG/DOSE Aepb Generic drug:  Fluticasone-Salmeterol INHALE 1 PUFF INTO THE LUNGS EVERY 12 HOURS   aspirin 81 MG tablet Take 81 mg by mouth daily.   BAYER CONTOUR MONITOR w/Device Kit CHECK BLOOD SUGAR EVERY DAY   bisoprolol-hydrochlorothiazide 2.5-6.25 MG tablet Commonly known as:  ZIAC TAKE 1 TABLET BY MOUTH DAILY   clopidogrel  75 MG tablet Commonly known as:  PLAVIX Take 1 tablet (75 mg total) by mouth daily.   CONTOUR NEXT TEST test strip Generic drug:  glucose blood CHECK BLOOD SUGAR EVERY DAY   diclofenac sodium 1 % Gel Commonly known as:  VOLTAREN Apply 2 g topically 4 (four) times daily. What changed:    when to take this  reasons to take this   fish oil-omega-3 fatty acids 1000 MG capsule Take 2  capsules (2 g total) by mouth 2 (two) times daily.   glimepiride 4 MG tablet Commonly known as:  AMARYL TAKE 1 TABLET BY MOUTH EVERY DAY WITH LUNCH   HAIR/SKIN/NAILS PO Take 1 tablet by mouth daily.   HYDROcodone-acetaminophen 5-325 MG tablet Commonly known as:  NORCO/VICODIN Take 1 tablet by mouth 4 (four) times daily. What changed:    when to take this  reasons to take this   isosorbide mononitrate 60 MG 24 hr tablet Commonly known as:  IMDUR TAKE 1 TABLET BY MOUTH DAILY   lisinopril 10 MG tablet Commonly known as:  PRINIVIL,ZESTRIL TAKE 1 TABLET BY MOUTH DAILY   metFORMIN 1000 MG tablet Commonly known as:  GLUCOPHAGE TAKE 1 TABLET BY MOUTH TWICE DAILY WITH MEALS   methocarbamol 500 MG tablet Commonly known as:  ROBAXIN TAKE 1 TABLET BY MOUTH EVERY 6 HOURS AS NEEDED FOR MUSCLE SPASMS   MUCUS RELIEF PO Take 10 mLs by mouth as needed (for congestion).   multivitamin tablet Take 1 tablet by mouth daily.   ondansetron 4 MG tablet Commonly known as:  ZOFRAN TAKE 1 TABLET(4 MG) BY MOUTH EVERY 8 HOURS AS NEEDED FOR NAUSEA OR VOMITING   pantoprazole 40 MG tablet Commonly known as:  PROTONIX TAKE 1 TABLET BY MOUTH DAILY   pioglitazone 30 MG tablet Commonly known as:  ACTOS TAKE 1 TABLET(30 MG) BY MOUTH DAILY   rosuvastatin 40 MG tablet Commonly known as:  CRESTOR TAKE 1 TABLET BY MOUTH DAILY   TRUBIOTICS PO Take 1 capsule by mouth daily.   VENTOLIN HFA 108 (90 Base) MCG/ACT inhaler Generic drug:  albuterol INHALE 2 PUFFS BY MOUTH TWICE DAILY AS NEEDED FOR WHEEZING OR SHORTNESS OF BREATH       Allergies  Allergen Reactions  . Gabapentin Other (See Comments)    Brain shakes  . Glucotrol [Glipizide] Rash and Other (See Comments)    Red rash  . Influenza Vaccines Other (See Comments)    Significant arm soreness requiring 1 year of physical therapy  . Latex Rash  . Milk-Related Compounds Rash and Other (See Comments)    Fever  . Nortriptyline Other  (See Comments)    BRAIN SHAKE  . Tetanus Toxoid Hives, Swelling, Rash and Other (See Comments)    Swelling to site of injection with rash and fever  . Invokana [Canagliflozin] Other (See Comments)    Yeast Infections    Consultation none  Procedures/Studies: Dg Chest 2 View  Result Date: 06/26/2017 CLINICAL DATA:  Pt states she has a history of pancreatitis and this feels similar with side and abdominal pain but also has a component of chest pain/sharp/pressure radiating to back. Constant. Vomiting and nausea present. Cannot tolerate po without increased pain. EXAM: CHEST - 2 VIEW COMPARISON:  Chest x-ray dated 05/13/2014. FINDINGS: The heart size and mediastinal contours are within normal limits. Aortic atherosclerosis noted. Both lungs are clear. Mild degenerative spurring within the slightly kyphotic thoracic spine. No acute or suspicious osseous finding. IMPRESSION: No active cardiopulmonary disease. No  evidence of pneumonia or pulmonary edema. Electronically Signed   By: Franki Cabot M.D.   On: 06/26/2017 21:36   Ct Abdomen Pelvis W Contrast  Result Date: 06/27/2017 CLINICAL DATA:  62 y/o F; abdominal pain with history of pancreatitis. EXAM: CT ABDOMEN AND PELVIS WITH CONTRAST TECHNIQUE: Multidetector CT imaging of the abdomen and pelvis was performed using the standard protocol following bolus administration of intravenous contrast. CONTRAST:  63m OMNIPAQUE IOHEXOL 300 MG/ML  SOLN COMPARISON:  01/28/2013 CT abdomen and pelvis. FINDINGS: Lower chest: Severe coronary artery calcifications. Hepatobiliary: No focal liver abnormality is seen. No gallstones, gallbladder wall thickening, or biliary dilatation. Pancreas: Extensive edema surrounding the pancreatic head, first and second segments of the duodenum, and within the mesentery compatible with acute pancreatitis. Multilobulated cyst between the left lobe of the liver and the pancreas measuring up to 19 mm. Calcification within the pancreas  compatible sequelae of chronic pancreatitis. Spleen: Normal in size without focal abnormality. Adrenals/Urinary Tract: Normal adrenal glands. Multiple cysts in the kidneys measuring up to 12 mm in the right upper pole. No hydronephrosis. Normal bladder. Stomach/Bowel: Stomach is within normal limits. Appendix appears normal. No evidence of bowel wall thickening, distention, or inflammatory changes. Mild sigmoid diverticulosis, no findings of acute diverticulitis. Vascular/Lymphatic: Aortic atherosclerosis. No enlarged abdominal or pelvic lymph nodes. Reproductive: Uterus and bilateral adnexa are unremarkable. Other: No abdominal wall hernia or abnormality. No abdominopelvic ascites. Musculoskeletal: No fracture is seen. IMPRESSION: 1. Acute on chronic pancreatitis. No findings of necrosis. 18 mm peripancreatic collection between the liver and head of pancreas. 2. Mild sigmoid diverticulosis, no findings of acute diverticulitis. 3. Aortic atherosclerosis. Electronically Signed   By: LKristine GarbeM.D.   On: 06/27/2017 05:16   UKoreaAbdomen Limited Ruq  Result Date: 06/27/2017 CLINICAL DATA:  62y/o  F; epigastric abdominal pain. EXAM: ULTRASOUND ABDOMEN LIMITED RIGHT UPPER QUADRANT COMPARISON:  03/26/2009 CT abdomen and pelvis. FINDINGS: Gallbladder: No gallstones or wall thickening visualized. No sonographic Murphy sign noted by sonographer. Common bile duct: Diameter: 5.9 mm Liver: No focal lesion identified. Within normal limits in parenchymal echogenicity. Portal vein is patent on color Doppler imaging with normal direction of blood flow towards the liver. IMPRESSION: No acute process identified. Unremarkable right upper quadrant ultrasound. Electronically Signed   By: LKristine GarbeM.D.   On: 06/27/2017 04:38    (Echo, Carotid, EGD, Colonoscopy, ERCP)    Subjective:   Discharge Exam: Vitals:   06/30/17 0934 06/30/17 1138  BP: 120/65 113/87  Pulse: 96 83  Resp: 14 20  Temp:  98.1 F (36.7 C) 98.3 F (36.8 C)  SpO2: 100% 99%   Vitals:   06/30/17 0416 06/30/17 0814 06/30/17 0934 06/30/17 1138  BP:   120/65 113/87  Pulse:   96 83  Resp:   14 20  Temp:   98.1 F (36.7 C) 98.3 F (36.8 C)  TempSrc:   Oral Oral  SpO2:  97% 100% 99%  Weight: 87.7 kg (193 lb 4 oz)     Height:        General: Pt is alert, awake, not in acute distress Cardiovascular: RRR, S1/S2 +, no rubs, no gallops Respiratory: CTA bilaterally, no wheezing, no rhonchi Abdominal: Soft, NT, ND, bowel sounds + Extremities: no edema, no cyanosis    The results of significant diagnostics from this hospitalization (including imaging, microbiology, ancillary and laboratory) are listed below for reference.     Microbiology: No results found for this or any previous visit (from  the past 240 hour(s)).   Labs: BNP (last 3 results) No results for input(s): BNP in the last 8760 hours. Basic Metabolic Panel: Recent Labs  Lab 06/26/17 2125 06/28/17 0515 06/29/17 0723 06/30/17 0617  NA 140 138 139 139  K 4.1 3.9 3.4* 3.5  CL 102 103 103 106  CO2 25 24 24 23   GLUCOSE 85 104* 105* 116*  BUN 24* 11 9 6   CREATININE 1.28* 1.08* 0.94 0.95  CALCIUM 10.0 9.0 8.6* 8.5*   Liver Function Tests: Recent Labs  Lab 06/27/17 0605 06/28/17 0515 06/29/17 0723  AST 18 15 14*  ALT 14 12* 11*  ALKPHOS 69 60 58  BILITOT 0.5 0.5 0.8  PROT 7.1 6.0* 5.9*  ALBUMIN 3.7 3.1* 3.0*   Recent Labs  Lab 06/26/17 2125 06/28/17 0515 06/30/17 0617  LIPASE 192* 89* 54*   No results for input(s): AMMONIA in the last 168 hours. CBC: Recent Labs  Lab 06/26/17 2125 06/29/17 0723  WBC 8.0 5.1  HGB 12.4 10.4*  HCT 39.4 33.7*  MCV 89.5 89.2  PLT 330 301   Cardiac Enzymes: Recent Labs  Lab 06/27/17 0605  TROPONINI <0.03   BNP: Invalid input(s): POCBNP CBG: Recent Labs  Lab 06/29/17 0737 06/29/17 1129 06/29/17 1637 06/29/17 2153 06/30/17 0736  GLUCAP 99 134* 128* 144* 107*   D-Dimer No  results for input(s): DDIMER in the last 72 hours. Hgb A1c No results for input(s): HGBA1C in the last 72 hours. Lipid Profile No results for input(s): CHOL, HDL, LDLCALC, TRIG, CHOLHDL, LDLDIRECT in the last 72 hours. Thyroid function studies No results for input(s): TSH, T4TOTAL, T3FREE, THYROIDAB in the last 72 hours.  Invalid input(s): FREET3 Anemia work up No results for input(s): VITAMINB12, FOLATE, FERRITIN, TIBC, IRON, RETICCTPCT in the last 72 hours. Urinalysis    Component Value Date/Time   COLORURINE YELLOW 06/26/2017 2135   APPEARANCEUR HAZY (A) 06/26/2017 2135   LABSPEC 1.011 06/26/2017 2135   PHURINE 5.0 06/26/2017 2135   GLUCOSEU NEGATIVE 06/26/2017 2135   HGBUR SMALL (A) 06/26/2017 2135   BILIRUBINUR NEGATIVE 06/26/2017 2135   KETONESUR NEGATIVE 06/26/2017 2135   PROTEINUR NEGATIVE 06/26/2017 2135   UROBILINOGEN 0.2 05/13/2014 1250   NITRITE POSITIVE (A) 06/26/2017 2135   LEUKOCYTESUR NEGATIVE 06/26/2017 2135   Sepsis Labs Invalid input(s): PROCALCITONIN,  WBC,  LACTICIDVEN Microbiology No results found for this or any previous visit (from the past 240 hour(s)).   Time coordinating discharge: 34 minutes  SIGNED:   Georgette Shell, MD  [facility] 06/30/2017, 12:09 PM Pager   If 7PM-7AM, please contact night-coverage [email or web address] Password [email or web address]

## 2017-07-05 ENCOUNTER — Other Ambulatory Visit: Payer: BLUE CROSS/BLUE SHIELD

## 2017-07-07 ENCOUNTER — Encounter: Payer: Self-pay | Admitting: Family Medicine

## 2017-07-07 ENCOUNTER — Ambulatory Visit: Payer: BLUE CROSS/BLUE SHIELD | Admitting: Family Medicine

## 2017-07-07 VITALS — BP 108/70 | HR 86 | Temp 98.2°F | Resp 14 | Ht 62.5 in | Wt 187.0 lb

## 2017-07-07 DIAGNOSIS — K861 Other chronic pancreatitis: Secondary | ICD-10-CM

## 2017-07-07 DIAGNOSIS — Z09 Encounter for follow-up examination after completed treatment for conditions other than malignant neoplasm: Secondary | ICD-10-CM

## 2017-07-07 DIAGNOSIS — K863 Pseudocyst of pancreas: Secondary | ICD-10-CM | POA: Diagnosis not present

## 2017-07-07 DIAGNOSIS — K862 Cyst of pancreas: Secondary | ICD-10-CM | POA: Diagnosis not present

## 2017-07-07 NOTE — Progress Notes (Signed)
Subjective:    Patient ID: [name], female    DOB: [birth year], 62 y.o.   MRN: [id number]  HPI  Patient was recently discharged from the hospital after being treated for chronic pancreatitis.  I have copied relevant portions of the discharge summary and included them below for my reference:  Admit date: 06/26/2017 Discharge date: 06/30/2017  Admitted From home Disposition: home  Recommendations for Outpatient Follow-up:  1. Follow up with PCP in 1-2 weeks 2. Please obtain BMP/CBC in one week  Home Health none Equipment/Devices: None Discharge Condition: Stable  CODE STATUS full Diet recommendation carb modified Brief/Interim Summary::62 y.o.Fwith COPD not on home O2, CAD ,DM, HTN, and chronic pancreatitis who presents with epigastric pain   Discharge Diagnoses:  Principal Problem:   Acute on chronic pancreatitis ([facility]) Active Problems:   TOBACCO ABUSE   Essential hypertension   PAD (peripheral artery disease) (HCC)   GERD (gastroesophageal reflux disease)   Renal artery stenosis (HCC)   CAD (coronary artery disease)   Diabetes mellitus with circulatory complication (HCC)  Acute on chronic pancreatitis-patient was treated with n.p.o. IV hydration and pain control.  Her symptoms improved with the above treatment.  He was started on clear liquid diet diet was advanced to regular diet she was able to tolerate the regular diet on the day of discharge.   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 This was discussed with Radiology who felt the significance was unclear, but that: -Pancreatic protocol CT in 3-6 months to reimage would not be unreasonable in this patient with risk factors (age, smoking, chronic pancreatitis).  Coronary disease Hypertension -Hold home lisinopril, BB, HCTZ -Continue Imdur -Resume aspirin, Plavix, statin  Diabetes -Hold metformin and glimepiride and  pioglitazone -SSI with meals  Tobacco use Patient states she has been off tobacco for 8 years  COPD  No active disease. -Continue Advair as a formulary alternative Dulera   Patient is here today for follow-up.  She states that she still has some mid abdominal tenderness.  She is able to eat and keep down solid food however she is experiencing early satiety.  She will eat 2 or 3 bites and feel completely full with no desire to eat more.  She is drinking very little.  She denies any fevers or chills.  She denies any chest pain.  She denies any trouble breathing.  She denies any nausea or vomiting.  She denies any melena or hematochezia.  Review of her lab work from the hospital shows a drop in her hemoglobin from 12-10 which was likely due to dehydration and aggressive IV fluid replacement.  This is due to be followed up.  She states that her blood sugars are 130s in the morning fasting.  2-hour postprandial sugars are under 200. Past Medical History:  Diagnosis Date  . Asthma with COPD ([place])   . CAD (coronary artery disease)    a. [place] 2011: Diffuse distal and branch vessel disease - patient managed medically, no interventional options. b. [place] 03/2014 - low risk, no ischemia.  . Complication of anesthesia    slow to wake up with last surgery in 2011   . Diabetes mellitus with circulatory complication ([place])   . Dyslipidemia   . Headache    hx of migraines   . Hypertension   . Osteoarthritis    severe right knee  R TKR  . PAD (peripheral artery disease) ([place])    a. Severe  stenosis mid right SFA s/p atherectomy 03/25/09. b. peripheral angiography in 12/2011 which showed only about 50% diffuse right SFA stenosis.  . Pancreatitis 2003  . Renal artery stenosis ([place])    a. [place] 2011 - 40-50% left RAS.  . Tobacco use disorder    quit 11/10   Past Surgical History:  Procedure Laterality Date  . ABDOMINAL AORTAGRAM N/A 12/21/2011   Procedure: ABDOMINAL Maxcine Ham;  Surgeon: Wellington Hampshire,  MD;  Location: [facility];  Service: Cardiovascular;  Laterality: N/A;  . CARDIAC CATHETERIZATION    . LOWER EXTREMITY ANGIOGRAM Bilateral 05/27/2015   Procedure: Lower Extremity Angiogram;  Surgeon: Wellington Hampshire, MD;  Location: [facility];  Service: Cardiovascular;  Laterality: Bilateral;  . PERIPHERAL VASCULAR CATHETERIZATION N/A 05/27/2015   Procedure: Abdominal Aortogram;  Surgeon: Wellington Hampshire, MD;  Location: [facility];  Service: Cardiovascular;  Laterality: N/A;  . PERIPHERAL VASCULAR CATHETERIZATION Right 05/27/2015   Procedure: Peripheral Vascular Intervention;  Surgeon: Wellington Hampshire, MD;  Location: [facility];  Service: Cardiovascular;  Laterality: Right;  SFA  . TOTAL KNEE ARTHROPLASTY Right 2011  . TOTAL KNEE ARTHROPLASTY Left 05/22/2014   Procedure: LEFT TOTAL KNEE ARTHROPLASTY;  Surgeon: Latanya Maudlin, MD;  Location: [facility];  Service: Orthopedics;  Laterality: Left;  . TUBAL LIGATION  1980   Current Outpatient Medications on File Prior to Visit  Medication Sig Dispense Refill  . ADVAIR DISKUS 250-50 MCG/DOSE AEPB INHALE 1 PUFF INTO THE LUNGS EVERY 12 HOURS 60 each 0  . aspirin 81 MG tablet Take 81 mg by mouth daily.      . Biotin w/ Vitamins C & E (HAIR/SKIN/NAILS PO) Take 1 tablet by mouth daily.    . bisoprolol-hydrochlorothiazide (ZIAC) 2.5-6.25 MG tablet TAKE 1 TABLET BY MOUTH DAILY 90 tablet 2  . Blood Glucose Monitoring Suppl (BAYER CONTOUR MONITOR) w/Device KIT CHECK BLOOD SUGAR EVERY DAY 1 kit 0  . clopidogrel (PLAVIX) 75 MG tablet Take 1 tablet (75 mg total) by mouth daily. 30 tablet 11  . CONTOUR NEXT TEST test strip CHECK BLOOD SUGAR EVERY DAY 100 each 5  . diclofenac sodium (VOLTAREN) 1 % GEL Apply 2 g topically 4 (four) times daily. (Patient taking differently: Apply 2 g topically 4 (four) times daily as needed (pain). ) 100 g 0  . fish oil-omega-3 fatty acids 1000 MG capsule Take 2 capsules (2 g total) by mouth 2 (two) times daily.  360 capsule 3  . glimepiride (AMARYL) 4 MG tablet TAKE 1 TABLET BY MOUTH EVERY DAY WITH LUNCH 90 tablet 1  . HYDROcodone-acetaminophen (NORCO/VICODIN) 5-325 MG tablet Take 1 tablet by mouth 4 (four) times daily. (Patient taking differently: Take 1 tablet by mouth 4 (four) times daily as needed for moderate pain. ) 120 tablet 0  . isosorbide mononitrate (IMDUR) 60 MG 24 hr tablet TAKE 1 TABLET BY MOUTH DAILY 90 tablet 2  . lisinopril (PRINIVIL,ZESTRIL) 10 MG tablet TAKE 1 TABLET BY MOUTH DAILY 90 tablet 2  . metFORMIN (GLUCOPHAGE) 1000 MG tablet TAKE 1 TABLET BY MOUTH TWICE DAILY WITH MEALS 180 tablet 1  . methocarbamol (ROBAXIN) 500 MG tablet TAKE 1 TABLET BY MOUTH EVERY 6 HOURS AS NEEDED FOR MUSCLE SPASMS 120 tablet 0  . Multiple Vitamin (MULTIVITAMIN) tablet Take 1 tablet by mouth daily.      . ondansetron (ZOFRAN) 4 MG tablet TAKE 1 TABLET(4 MG) BY MOUTH EVERY 8 HOURS AS NEEDED FOR NAUSEA OR VOMITING 30 tablet  0  . pantoprazole (PROTONIX) 40 MG tablet TAKE 1 TABLET BY MOUTH DAILY 90 tablet 3  . pioglitazone (ACTOS) 30 MG tablet TAKE 1 TABLET(30 MG) BY MOUTH DAILY 30 tablet 3  . Probiotic Product (TRUBIOTICS PO) Take 1 capsule by mouth daily.    . rosuvastatin (CRESTOR) 40 MG tablet TAKE 1 TABLET BY MOUTH DAILY 30 tablet 0  . VENTOLIN HFA 108 (90 Base) MCG/ACT inhaler INHALE 2 PUFFS BY MOUTH TWICE DAILY AS NEEDED FOR WHEEZING OR SHORTNESS OF BREATH 54 g 3   No current facility-administered medications on file prior to visit.    Allergies  Allergen Reactions  . Gabapentin Other (See Comments)    Brain shakes  . Glucotrol [Glipizide] Rash and Other (See Comments)    Red rash  . Influenza Vaccines Other (See Comments)    Significant arm soreness requiring 1 year of physical therapy  . Latex Rash  . Milk-Related Compounds Rash and Other (See Comments)    Fever  . Nortriptyline Other (See Comments)    BRAIN SHAKE  . Tetanus Toxoid Hives, Swelling, Rash and Other (See Comments)    Swelling  to site of injection with rash and fever  . Invokana [Canagliflozin] Other (See Comments)    Yeast Infections   Social History   Socioeconomic History  . Marital status: Married    Spouse name: Not on file  . Number of children: Not on file  . Years of education: 47  . Highest education level: Not on file  Occupational History  . Not on file  Social Needs  . Financial resource strain: Not on file  . Food insecurity:    Worry: Not on file    Inability: Not on file  . Transportation needs:    Medical: Not on file    Non-medical: Not on file  Tobacco Use  . Smoking status: Former Smoker    Last attempt to quit: 12/13/2008    Years since quitting: 8.5  . Smokeless tobacco: Never Used  Substance and Sexual Activity  . Alcohol use: No  . Drug use: No  . Sexual activity: Not on file  Lifestyle  . Physical activity:    Days per week: Not on file    Minutes per session: Not on file  . Stress: Not on file  Relationships  . Social connections:    Talks on phone: Not on file    Gets together: Not on file    Attends religious service: Not on file    Active member of club or organization: Not on file    Attends meetings of clubs or organizations: Not on file    Relationship status: Not on file  . Intimate partner violence:    Fear of current or ex partner: Not on file    Emotionally abused: Not on file    Physically abused: Not on file    Forced sexual activity: Not on file  Other Topics Concern  . Not on file  Social History Narrative   Denies any IV drug use or marijuana use.  Former smoker for 30 years, quit Nov. 2010. She is married with 2 children. Disabled from knee, not working.     Review of Systems  All other systems reviewed and are negative.      Objective:   Physical Exam  Constitutional: She appears well-developed and well-nourished. No distress.  Neck: Normal range of motion. Neck supple. No JVD present. No thyromegaly present.  Cardiovascular: Normal  rate, regular  rhythm and normal heart sounds. Exam reveals no gallop and no friction rub.  No murmur heard. Pulmonary/Chest: [place] normal and breath sounds normal. No stridor. No respiratory distress. She has no wheezes. She has no rales.  Abdominal: Soft. Bowel sounds are normal. She exhibits no distension and no mass. There is tenderness. There is no rebound and no guarding. No hernia.  Lymphadenopathy:    She has no cervical adenopathy.  Skin: She is not diaphoretic.  Vitals reviewed.         Assessment & Plan:  Hospital discharge follow-up - Plan: CBC with Differential/Platelet, COMPLETE METABOLIC PANEL WITH GFR  Idiopathic chronic pancreatitis [facility])  Pancreatic pseudocyst  Cyst of pancreas - Plan: CT Abdomen Pelvis W Contrast, CANCELED: CT Abdomen Pelvis W Contrast  Patient seems to be improving.  I have recommended rechecking her in 3 to 4 weeks to ensure complete and total resolution and that she returns back to her baseline.  I would repeat a CT scan of the abdomen and pelvis in 3 months to ensure resolution of the peripancreatic cyst/fluid collection seen on CT.  I reviewed the CT findings with the patient and showed her the fluid collection on CT and explained the necessity for follow-up to rule out underlying pancreatic malignancy.  Hemoglobin A1c was found to be 6.7 prior to hospitalization and well-controlled.  I will repeat a CBC to ensure that the drop in hemoglobin was simply due to rehydration and that there is no persistent drop.  I will also repeat a CMP to monitor her renal function and potassium given the fact she is taking very little oral intake due to early satiety.  I encouraged 3 to 4 glasses of water a day and gradually increasing her diet as tolerated.  Recheck sooner if worsening

## 2017-07-08 LAB — COMPLETE METABOLIC PANEL WITH GFR
AG Ratio: 1.4 (calc) (ref 1.0–2.5)
ALBUMIN MSPROF: 3.9 g/dL (ref 3.6–5.1)
ALKALINE PHOSPHATASE (APISO): 85 U/L (ref 33–130)
ALT: 21 U/L (ref 6–29)
AST: 26 U/L (ref 10–35)
BUN / CREAT RATIO: 18 (calc) (ref 6–22)
BUN: 23 mg/dL (ref 7–25)
CO2: 27 mmol/L (ref 20–32)
CREATININE: 1.29 mg/dL — AB (ref 0.50–0.99)
Calcium: 9.9 mg/dL (ref 8.6–10.4)
Chloride: 97 mmol/L — ABNORMAL LOW (ref 98–110)
GFR, Est African American: 52 mL/min/{1.73_m2} — ABNORMAL LOW (ref >=60)
GFR, Est Non African American: 45 mL/min/{1.73_m2} — ABNORMAL LOW (ref >=60)
GLUCOSE: 128 mg/dL — AB (ref 65–99)
Globulin: 2.7 g/dL (calc) (ref 1.9–3.7)
Potassium: 4.3 mmol/L (ref 3.5–5.3)
Sodium: 138 mmol/L (ref 135–146)
Total Bilirubin: 0.2 mg/dL (ref 0.2–1.2)
Total Protein: 6.6 g/dL (ref 6.1–8.1)

## 2017-07-08 LAB — CBC WITH DIFFERENTIAL/PLATELET
Basophils Absolute: 53 cells/uL (ref 0–200)
Basophils Relative: 0.8 %
Eosinophils Absolute: 376 cells/uL (ref 15–500)
Eosinophils Relative: 5.7 %
HCT: 32.9 % — ABNORMAL LOW (ref 35.0–45.0)
Hemoglobin: 10.7 g/dL — ABNORMAL LOW (ref 11.7–15.5)
Lymphs Abs: 1643 cells/uL (ref 850–3900)
MCH: 27.7 pg (ref 27.0–33.0)
MCHC: 32.5 g/dL (ref 32.0–36.0)
MCV: 85.2 fL (ref 80.0–100.0)
MPV: 8.5 fL (ref 7.5–12.5)
Monocytes Relative: 8.6 %
NEUTROS PCT: 60 %
Neutro Abs: 3960 cells/uL (ref 1500–7800)
PLATELETS: 574 10*3/uL — AB (ref 140–400)
RBC: 3.86 10*6/uL (ref 3.80–5.10)
RDW: 13.1 % (ref 11.0–15.0)
Total Lymphocyte: 24.9 %
WBC mixed population: 568 cells/uL (ref 200–950)
WBC: 6.6 10*3/uL (ref 3.8–10.8)

## 2017-07-12 ENCOUNTER — Other Ambulatory Visit: Payer: Self-pay | Admitting: Family Medicine

## 2017-07-12 DIAGNOSIS — D509 Iron deficiency anemia, unspecified: Secondary | ICD-10-CM

## 2017-07-14 ENCOUNTER — Other Ambulatory Visit: Payer: Self-pay

## 2017-07-14 ENCOUNTER — Encounter: Payer: Self-pay | Admitting: Family Medicine

## 2017-07-14 ENCOUNTER — Emergency Department (HOSPITAL_COMMUNITY): Payer: BLUE CROSS/BLUE SHIELD

## 2017-07-14 ENCOUNTER — Encounter (HOSPITAL_COMMUNITY): Payer: Self-pay

## 2017-07-14 ENCOUNTER — Inpatient Hospital Stay (HOSPITAL_COMMUNITY)
Admission: EM | Admit: 2017-07-14 | Discharge: 2017-07-19 | DRG: 439 | Disposition: A | Discharge status: Home Health | Payer: BLUE CROSS/BLUE SHIELD | Attending: Internal Medicine | Admitting: Internal Medicine

## 2017-07-14 ENCOUNTER — Ambulatory Visit: Payer: BLUE CROSS/BLUE SHIELD | Admitting: Family Medicine

## 2017-07-14 VITALS — BP 110/60 | HR 90 | Temp 97.7°F | Resp 16 | Ht 62.5 in | Wt 182.0 lb

## 2017-07-14 DIAGNOSIS — N179 Acute kidney failure, unspecified: Secondary | ICD-10-CM | POA: Diagnosis not present

## 2017-07-14 DIAGNOSIS — Z9862 Peripheral vascular angioplasty status: Secondary | ICD-10-CM

## 2017-07-14 DIAGNOSIS — Z7902 Long term (current) use of antithrombotics/antiplatelets: Secondary | ICD-10-CM

## 2017-07-14 DIAGNOSIS — Z96653 Presence of artificial knee joint, bilateral: Secondary | ICD-10-CM | POA: Diagnosis present

## 2017-07-14 DIAGNOSIS — K760 Fatty (change of) liver, not elsewhere classified: Secondary | ICD-10-CM | POA: Diagnosis present

## 2017-07-14 DIAGNOSIS — K863 Pseudocyst of pancreas: Secondary | ICD-10-CM | POA: Diagnosis present

## 2017-07-14 DIAGNOSIS — K859 Acute pancreatitis without necrosis or infection, unspecified: Secondary | ICD-10-CM

## 2017-07-14 DIAGNOSIS — K861 Other chronic pancreatitis: Secondary | ICD-10-CM | POA: Diagnosis present

## 2017-07-14 DIAGNOSIS — Z79899 Other long term (current) drug therapy: Secondary | ICD-10-CM

## 2017-07-14 DIAGNOSIS — I10 Essential (primary) hypertension: Secondary | ICD-10-CM | POA: Diagnosis not present

## 2017-07-14 DIAGNOSIS — N183 Chronic kidney disease, stage 3 (moderate): Secondary | ICD-10-CM | POA: Diagnosis present

## 2017-07-14 DIAGNOSIS — E1151 Type 2 diabetes mellitus with diabetic peripheral angiopathy without gangrene: Secondary | ICD-10-CM | POA: Diagnosis present

## 2017-07-14 DIAGNOSIS — E1122 Type 2 diabetes mellitus with diabetic chronic kidney disease: Secondary | ICD-10-CM | POA: Diagnosis present

## 2017-07-14 DIAGNOSIS — Z87891 Personal history of nicotine dependence: Secondary | ICD-10-CM

## 2017-07-14 DIAGNOSIS — Z7984 Long term (current) use of oral hypoglycemic drugs: Secondary | ICD-10-CM

## 2017-07-14 DIAGNOSIS — E860 Dehydration: Secondary | ICD-10-CM | POA: Diagnosis present

## 2017-07-14 DIAGNOSIS — I2510 Atherosclerotic heart disease of native coronary artery without angina pectoris: Secondary | ICD-10-CM | POA: Diagnosis not present

## 2017-07-14 DIAGNOSIS — R6881 Early satiety: Secondary | ICD-10-CM | POA: Diagnosis present

## 2017-07-14 DIAGNOSIS — E119 Type 2 diabetes mellitus without complications: Secondary | ICD-10-CM | POA: Diagnosis not present

## 2017-07-14 DIAGNOSIS — Z7951 Long term (current) use of inhaled steroids: Secondary | ICD-10-CM

## 2017-07-14 DIAGNOSIS — J449 Chronic obstructive pulmonary disease, unspecified: Secondary | ICD-10-CM | POA: Diagnosis present

## 2017-07-14 DIAGNOSIS — Z7982 Long term (current) use of aspirin: Secondary | ICD-10-CM

## 2017-07-14 DIAGNOSIS — I701 Atherosclerosis of renal artery: Secondary | ICD-10-CM | POA: Diagnosis present

## 2017-07-14 DIAGNOSIS — I700 Atherosclerosis of aorta: Secondary | ICD-10-CM | POA: Diagnosis present

## 2017-07-14 DIAGNOSIS — D638 Anemia in other chronic diseases classified elsewhere: Secondary | ICD-10-CM | POA: Diagnosis present

## 2017-07-14 DIAGNOSIS — E785 Hyperlipidemia, unspecified: Secondary | ICD-10-CM | POA: Diagnosis present

## 2017-07-14 DIAGNOSIS — I129 Hypertensive chronic kidney disease with stage 1 through stage 4 chronic kidney disease, or unspecified chronic kidney disease: Secondary | ICD-10-CM | POA: Diagnosis present

## 2017-07-14 DIAGNOSIS — N281 Cyst of kidney, acquired: Secondary | ICD-10-CM | POA: Diagnosis present

## 2017-07-14 LAB — CBG MONITORING, ED
GLUCOSE-CAPILLARY: 118 mg/dL — AB (ref 65–99)
GLUCOSE-CAPILLARY: 39 mg/dL — AB (ref 65–99)
Glucose-Capillary: 159 mg/dL — ABNORMAL HIGH (ref 65–99)

## 2017-07-14 LAB — URINALYSIS, ROUTINE W REFLEX MICROSCOPIC
Glucose, UA: NEGATIVE mg/dL
Ketones, ur: 5 mg/dL — AB
Nitrite: NEGATIVE
Protein, ur: 100 mg/dL — AB
SPECIFIC GRAVITY, URINE: 1.025 (ref 1.005–1.030)
WBC, UA: >50 WBC/hpf — ABNORMAL HIGH (ref 0–5)
pH: 5 (ref 5.0–8.0)

## 2017-07-14 LAB — COMPREHENSIVE METABOLIC PANEL
ALBUMIN: 3.8 g/dL (ref 3.5–5.0)
ALT: 16 U/L (ref 14–54)
AST: 21 U/L (ref 15–41)
Alkaline Phosphatase: 70 U/L (ref 38–126)
Anion gap: 13 (ref 5–15)
BUN: 29 mg/dL — AB (ref 6–20)
CHLORIDE: 98 mmol/L — AB (ref 101–111)
CO2: 26 mmol/L (ref 22–32)
CREATININE: 1.64 mg/dL — AB (ref 0.44–1.00)
Calcium: 10.1 mg/dL (ref 8.9–10.3)
GFR calc Af Amer: 38 mL/min — ABNORMAL LOW (ref >60)
GFR calc non Af Amer: 33 mL/min — ABNORMAL LOW (ref >60)
GLUCOSE: 140 mg/dL — AB (ref 65–99)
POTASSIUM: 4.4 mmol/L (ref 3.5–5.1)
Sodium: 137 mmol/L (ref 135–145)
Total Bilirubin: 0.7 mg/dL (ref 0.3–1.2)
Total Protein: 7.8 g/dL (ref 6.5–8.1)

## 2017-07-14 LAB — CBC
HEMATOCRIT: 37.8 % (ref 36.0–46.0)
Hemoglobin: 11.6 g/dL — ABNORMAL LOW (ref 12.0–15.0)
MCH: 27.2 pg (ref 26.0–34.0)
MCHC: 30.7 g/dL (ref 30.0–36.0)
MCV: 88.5 fL (ref 78.0–100.0)
Platelets: 440 10*3/uL — ABNORMAL HIGH (ref 150–400)
RBC: 4.27 MIL/uL (ref 3.87–5.11)
RDW: 14.5 % (ref 11.5–15.5)
WBC: 6.9 10*3/uL (ref 4.0–10.5)

## 2017-07-14 LAB — LIPASE, BLOOD: LIPASE: 43 U/L (ref 11–51)

## 2017-07-14 MED ORDER — SODIUM CHLORIDE 0.9 % IV BOLUS
1000.0000 mL | Freq: Once | INTRAVENOUS | Status: AC
  Administered 2017-07-14: 1000 mL via INTRAVENOUS

## 2017-07-14 MED ORDER — ADULT MULTIVITAMIN W/MINERALS CH
1.0000 | ORAL_TABLET | Freq: Every day | ORAL | Status: DC
  Administered 2017-07-15 – 2017-07-19 (×5): 1 via ORAL
  Filled 2017-07-14 (×5): qty 1

## 2017-07-14 MED ORDER — PANTOPRAZOLE SODIUM 40 MG PO TBEC
40.0000 mg | DELAYED_RELEASE_TABLET | Freq: Every day | ORAL | Status: DC
  Administered 2017-07-15 – 2017-07-19 (×5): 40 mg via ORAL
  Filled 2017-07-14 (×7): qty 1

## 2017-07-14 MED ORDER — MOMETASONE FURO-FORMOTEROL FUM 200-5 MCG/ACT IN AERO
2.0000 | INHALATION_SPRAY | Freq: Two times a day (BID) | RESPIRATORY_TRACT | Status: DC
  Administered 2017-07-15 – 2017-07-19 (×9): 2 via RESPIRATORY_TRACT
  Filled 2017-07-14: qty 8.8

## 2017-07-14 MED ORDER — ALBUTEROL SULFATE HFA 108 (90 BASE) MCG/ACT IN AERS: 2.0000 | INHALATION_SPRAY | Freq: Four times a day (QID) | RESPIRATORY_TRACT | Status: DC | PRN

## 2017-07-14 MED ORDER — CLOPIDOGREL BISULFATE 75 MG PO TABS
75.0000 mg | ORAL_TABLET | Freq: Every day | ORAL | Status: DC
  Administered 2017-07-15 – 2017-07-19 (×5): 75 mg via ORAL
  Filled 2017-07-14 (×5): qty 1

## 2017-07-14 MED ORDER — ROSUVASTATIN CALCIUM 10 MG PO TABS
40.0000 mg | ORAL_TABLET | Freq: Every day | ORAL | Status: DC
  Administered 2017-07-15 – 2017-07-19 (×5): 40 mg via ORAL
  Filled 2017-07-14: qty 1
  Filled 2017-07-14 (×6): qty 4

## 2017-07-14 MED ORDER — ACETAMINOPHEN 325 MG PO TABS
650.0000 mg | ORAL_TABLET | Freq: Four times a day (QID) | ORAL | Status: DC | PRN
  Administered 2017-07-16 – 2017-07-18 (×2): 650 mg via ORAL
  Filled 2017-07-14 (×2): qty 2

## 2017-07-14 MED ORDER — OXYCODONE-ACETAMINOPHEN 5-325 MG PO TABS
1.0000 | ORAL_TABLET | ORAL | Status: AC | PRN
  Administered 2017-07-14 – 2017-07-18 (×2): 1 via ORAL
  Filled 2017-07-14 (×2): qty 1

## 2017-07-14 MED ORDER — ONDANSETRON HCL 4 MG/2ML IJ SOLN
4.0000 mg | Freq: Four times a day (QID) | INTRAMUSCULAR | Status: DC | PRN
  Administered 2017-07-15 – 2017-07-17 (×2): 4 mg via INTRAVENOUS
  Filled 2017-07-14 (×2): qty 2

## 2017-07-14 MED ORDER — ASPIRIN EC 81 MG PO TBEC
81.0000 mg | DELAYED_RELEASE_TABLET | Freq: Every day | ORAL | Status: DC
  Administered 2017-07-15 – 2017-07-19 (×5): 81 mg via ORAL
  Filled 2017-07-14 (×5): qty 1

## 2017-07-14 MED ORDER — DEXTROSE 50 % IV SOLN
1.0000 | Freq: Once | INTRAVENOUS | Status: AC
  Administered 2017-07-14: 50 mL via INTRAVENOUS

## 2017-07-14 MED ORDER — SODIUM CHLORIDE 0.9 % IV SOLN
INTRAVENOUS | Status: DC
  Administered 2017-07-14 – 2017-07-19 (×10): via INTRAVENOUS

## 2017-07-14 MED ORDER — IOHEXOL 300 MG/ML  SOLN
100.0000 mL | Freq: Once | INTRAMUSCULAR | Status: AC | PRN
  Administered 2017-07-14: 100 mL via INTRAVENOUS

## 2017-07-14 MED ORDER — ALBUTEROL SULFATE (2.5 MG/3ML) 0.083% IN NEBU: 2.5000 mg | INHALATION_SOLUTION | Freq: Four times a day (QID) | RESPIRATORY_TRACT | Status: DC | PRN

## 2017-07-14 MED ORDER — INSULIN ASPART 100 UNIT/ML ~~LOC~~ SOLN
0.0000 [IU] | SUBCUTANEOUS | Status: DC
  Administered 2017-07-15: 1 [IU] via SUBCUTANEOUS
  Administered 2017-07-16: 2 [IU] via SUBCUTANEOUS
  Administered 2017-07-16 – 2017-07-18 (×4): 1 [IU] via SUBCUTANEOUS
  Administered 2017-07-19: 2 [IU] via SUBCUTANEOUS

## 2017-07-14 MED ORDER — ACETAMINOPHEN 650 MG RE SUPP: 650.0000 mg | Freq: Four times a day (QID) | RECTAL | Status: DC | PRN

## 2017-07-14 MED ORDER — ISOSORBIDE MONONITRATE ER 30 MG PO TB24: 60.0000 mg | ORAL_TABLET | Freq: Every day | ORAL | Status: DC

## 2017-07-14 MED ORDER — ENOXAPARIN SODIUM 40 MG/0.4ML ~~LOC~~ SOLN
40.0000 mg | SUBCUTANEOUS | Status: DC
  Administered 2017-07-14 – 2017-07-18 (×5): 40 mg via SUBCUTANEOUS
  Filled 2017-07-14 (×6): qty 0.4

## 2017-07-14 MED ORDER — DICLOFENAC SODIUM 1 % TD GEL
2.0000 g | Freq: Four times a day (QID) | TRANSDERMAL | Status: DC | PRN
  Filled 2017-07-14: qty 100

## 2017-07-14 MED ORDER — MORPHINE SULFATE (PF) 4 MG/ML IV SOLN
6.0000 mg | Freq: Once | INTRAVENOUS | Status: AC
Start: 2017-07-14 — End: 2017-07-14
  Administered 2017-07-14: 6 mg via INTRAVENOUS
  Filled 2017-07-14: qty 2

## 2017-07-14 MED ORDER — MORPHINE SULFATE (PF) 4 MG/ML IV SOLN
2.0000 mg | INTRAVENOUS | Status: DC | PRN
  Administered 2017-07-14 – 2017-07-15 (×3): 4 mg via INTRAVENOUS
  Administered 2017-07-15: 2 mg via INTRAVENOUS
  Administered 2017-07-15 – 2017-07-16 (×4): 4 mg via INTRAVENOUS
  Administered 2017-07-16: 2 mg via INTRAVENOUS
  Administered 2017-07-16 – 2017-07-18 (×8): 4 mg via INTRAVENOUS
  Filled 2017-07-14 (×17): qty 1

## 2017-07-14 MED ORDER — ONDANSETRON HCL 4 MG PO TABS: 4.0000 mg | ORAL_TABLET | Freq: Four times a day (QID) | ORAL | Status: DC | PRN

## 2017-07-14 MED ORDER — METHOCARBAMOL 500 MG PO TABS
500.0000 mg | ORAL_TABLET | Freq: Four times a day (QID) | ORAL | Status: DC | PRN
  Administered 2017-07-16: 500 mg via ORAL
  Filled 2017-07-14: qty 1

## 2017-07-14 MED ORDER — DEXTROSE 50 % IV SOLN
INTRAVENOUS | Status: AC
  Administered 2017-07-14
  Filled 2017-07-14: qty 50

## 2017-07-14 MED ORDER — ONDANSETRON HCL 4 MG/2ML IJ SOLN
4.0000 mg | Freq: Once | INTRAMUSCULAR | Status: AC
  Administered 2017-07-14: 4 mg via INTRAVENOUS
  Filled 2017-07-14: qty 2

## 2017-07-14 NOTE — H&P (Addendum)
History and Physical    [name] DPO:[id number] DOB: [birth year] DOA: 07/14/2017  PCP: Susy Frizzle, MD  Patient coming from: Home  I have personally briefly reviewed patient's old medical records in [place]  Chief Complaint: Abd pain  HPI: [name] is a 62 y.o. female with medical history significant of DM2, CAD, recent acute pancreatitis treated from May 13-17.  Patient returns to ED today for worsening abd pain and decreased PO intake.  Pt states pain only worsening since going home.  Pain in epigastric area, radiates to back and LUQ.  Persistent nausea but no vomiting.  No fever, no CP, no SOB.   ED Course: Lipase 40.  CT shows development of pseudocyst and fluid collections around pancreas.  Creat 1.6 up from baseline 1.0   Review of Systems: As per HPI otherwise 10 point review of systems negative.   Past Medical History:  Diagnosis Date  . Asthma with COPD ([place])   . CAD (coronary artery disease)    a. [place] 2011: Diffuse distal and branch vessel disease - patient managed medically, no interventional options. b. [place] 03/2014 - low risk, no ischemia.  . Complication of anesthesia    slow to wake up with last surgery in 2011   . Diabetes mellitus with circulatory complication ([place])   . Dyslipidemia   . Headache    hx of migraines   . Hypertension   . Osteoarthritis    severe right knee  R TKR  . PAD (peripheral artery disease) ([place])    a. Severe stenosis mid right SFA s/p atherectomy 03/25/09. b. peripheral angiography in 12/2011 which showed only about 50% diffuse right SFA stenosis.  . Pancreatitis 2003  . Renal artery stenosis ([place])    a. [place] 2011 - 40-50% left RAS.  . Tobacco use disorder    quit 11/10    Past Surgical History:  Procedure Laterality Date  . ABDOMINAL AORTAGRAM N/A 12/21/2011   Procedure: ABDOMINAL Maxcine Ham;  Surgeon: Wellington Hampshire, MD;  Location: [facility];  Service: Cardiovascular;  Laterality: N/A;  . CARDIAC  CATHETERIZATION    . LOWER EXTREMITY ANGIOGRAM Bilateral 05/27/2015   Procedure: Lower Extremity Angiogram;  Surgeon: Wellington Hampshire, MD;  Location: [facility];  Service: Cardiovascular;  Laterality: Bilateral;  . PERIPHERAL VASCULAR CATHETERIZATION N/A 05/27/2015   Procedure: Abdominal Aortogram;  Surgeon: Wellington Hampshire, MD;  Location: [facility];  Service: Cardiovascular;  Laterality: N/A;  . PERIPHERAL VASCULAR CATHETERIZATION Right 05/27/2015   Procedure: Peripheral Vascular Intervention;  Surgeon: Wellington Hampshire, MD;  Location: [facility];  Service: Cardiovascular;  Laterality: Right;  SFA  . TOTAL KNEE ARTHROPLASTY Right 2011  . TOTAL KNEE ARTHROPLASTY Left 05/22/2014   Procedure: LEFT TOTAL KNEE ARTHROPLASTY;  Surgeon: Latanya Maudlin, MD;  Location: [facility];  Service: Orthopedics;  Laterality: Left;  . TUBAL LIGATION  1980     reports that she quit smoking about 8 years ago. She has never used smokeless tobacco. She reports that she does not drink alcohol or use drugs.  Allergies  Allergen Reactions  . Gabapentin Other (See Comments)    Brain shakes  . Glucotrol [Glipizide] Rash and Other (See Comments)    Red rash  . Influenza Vaccines Other (See Comments)    Significant arm soreness requiring 1 year of physical therapy  . Latex Rash  . Nortriptyline Other (See Comments)    BRAIN SHAKE  . Tetanus Toxoid Hives, Swelling,  Rash and Other (See Comments)    Swelling to site of injection with rash and fever  . Invokana [Canagliflozin] Other (See Comments)    Yeast Infections    Family History  Problem Relation Age of Onset  . CVA Mother   . Emphysema Father   . Breast cancer Sister 48     Prior to Admission medications   Medication Sig Start Date End Date Taking? Authorizing Provider  ADVAIR DISKUS 250-50 MCG/DOSE AEPB INHALE 1 PUFF INTO THE LUNGS EVERY 12 HOURS 02/02/17   Alycia Rossetti, MD  aspirin 81 MG tablet Take 81 mg by mouth daily.       [provider]  Biotin w/ Vitamins C & E (HAIR/SKIN/NAILS PO) Take 1 tablet by mouth daily.    [provider]  bisoprolol-hydrochlorothiazide [facility]) 2.5-6.25 MG tablet TAKE 1 TABLET BY MOUTH DAILY 06/14/17   Susy Frizzle, MD  Blood Glucose Monitoring Suppl (BAYER CONTOUR MONITOR) w/Device KIT CHECK BLOOD SUGAR EVERY DAY 08/20/15   Susy Frizzle, MD  clopidogrel (PLAVIX) 75 MG tablet Take 1 tablet (75 mg total) by mouth daily. 04/06/17   Wellington Hampshire, MD  CONTOUR NEXT TEST test strip CHECK BLOOD SUGAR EVERY DAY 09/05/16   Susy Frizzle, MD  diclofenac sodium (VOLTAREN) 1 % GEL Apply 2 g topically 4 (four) times daily. Patient taking differently: Apply 2 g topically 4 (four) times daily as needed (pain).  05/04/16   Susy Frizzle, MD  fish oil-omega-3 fatty acids 1000 MG capsule Take 2 capsules (2 g total) by mouth 2 (two) times daily. 02/03/12   Larey Dresser, MD  glimepiride (AMARYL) 4 MG tablet TAKE 1 TABLET BY MOUTH EVERY DAY WITH LUNCH 04/24/17   Susy Frizzle, MD  isosorbide mononitrate (IMDUR) 60 MG 24 hr tablet TAKE 1 TABLET BY MOUTH DAILY 05/29/17   Susy Frizzle, MD  lisinopril (PRINIVIL,ZESTRIL) 10 MG tablet TAKE 1 TABLET BY MOUTH DAILY 05/29/17   Susy Frizzle, MD  metFORMIN (GLUCOPHAGE) 1000 MG tablet TAKE 1 TABLET BY MOUTH TWICE DAILY WITH MEALS 04/14/17   Susy Frizzle, MD  methocarbamol (ROBAXIN) 500 MG tablet TAKE 1 TABLET BY MOUTH EVERY 6 HOURS AS NEEDED FOR MUSCLE SPASMS 06/16/17   Susy Frizzle, MD  Multiple Vitamin (MULTIVITAMIN) tablet Take 1 tablet by mouth daily.      [provider]  ondansetron (ZOFRAN) 4 MG tablet TAKE 1 TABLET(4 MG) BY MOUTH EVERY 8 HOURS AS NEEDED FOR NAUSEA OR VOMITING 06/27/17   Susy Frizzle, MD  pantoprazole (PROTONIX) 40 MG tablet TAKE 1 TABLET BY MOUTH DAILY 04/03/17   Susy Frizzle, MD  pioglitazone (ACTOS) 30 MG tablet TAKE 1 TABLET(30 MG) BY MOUTH DAILY 12/26/16   Susy Frizzle,  MD  Probiotic Product (TRUBIOTICS PO) Take 1 capsule by mouth daily.    [provider]  rosuvastatin (CRESTOR) 40 MG tablet TAKE 1 TABLET BY MOUTH DAILY 06/20/17   Susy Frizzle, MD  VENTOLIN HFA 108 [id number] Base) MCG/ACT inhaler INHALE 2 PUFFS BY MOUTH TWICE DAILY AS NEEDED FOR WHEEZING OR SHORTNESS OF BREATH 08/16/16   Susy Frizzle, MD    Physical Exam: Vitals:   07/14/17 1930 07/14/17 2000 07/14/17 2030 07/14/17 2100  BP: 101/61 100/66 (!) 99/58 112/70  Pulse: 83 83 85 86  Resp:      Temp:      TempSrc:      SpO2: 93% 96% 97% 94%  Constitutional: NAD, calm, comfortable Eyes: PERRL, lids and conjunctivae normal ENMT: Mucous membranes are moist. Posterior pharynx clear of any exudate or lesions.Normal dentition.  Neck: normal, supple, no masses, no thyromegaly Respiratory: clear to auscultation bilaterally, no wheezing, no crackles. Normal respiratory effort. No accessory muscle use.  Cardiovascular: Regular rate and rhythm, no murmurs / rubs / gallops. No extremity edema. 2+ pedal pulses. No carotid bruits.  Abdomen: Epigastric TTP. Musculoskeletal: no clubbing / cyanosis. No joint deformity upper and lower extremities. Good ROM, no contractures. Normal muscle tone.  Skin: no rashes, lesions, ulcers. No induration Neurologic: CN 2-12 grossly intact. Sensation intact, DTR normal. Strength 5/5 in all 4.  Psychiatric: Normal judgment and insight. Alert and oriented x 3. Normal mood.    Labs on Admission: I have personally reviewed following labs and imaging studies  CBC: Recent Labs  Lab 07/14/17 1116  WBC 6.9  HGB 11.6*  HCT 37.8  MCV 88.5  PLT 253*   Basic Metabolic Panel: Recent Labs  Lab 07/14/17 1116  NA 137  K 4.4  CL 98*  CO2 26  GLUCOSE 140*  BUN 29*  CREATININE 1.64*  CALCIUM 10.1   GFR: Estimated Creatinine Clearance: 36.7 mL/min (A) (by C-G formula based on SCr of 1.64 mg/dL (H)). Liver Function Tests: Recent Labs  Lab 07/14/17 1116    AST 21  ALT 16  ALKPHOS 70  BILITOT 0.7  PROT 7.8  ALBUMIN 3.8   Recent Labs  Lab 07/14/17 1116  LIPASE 43   No results for input(s): AMMONIA in the last 168 hours. Coagulation Profile: No results for input(s): INR, PROTIME in the last 168 hours. Cardiac Enzymes: No results for input(s): CKTOTAL, CKMB, CKMBINDEX, TROPONINI in the last 168 hours. BNP (last 3 results) No results for input(s): PROBNP in the last 8760 hours. HbA1C: No results for input(s): HGBA1C in the last 72 hours. CBG: No results for input(s): GLUCAP in the last 168 hours. Lipid Profile: No results for input(s): CHOL, HDL, LDLCALC, TRIG, CHOLHDL, LDLDIRECT in the last 72 hours. Thyroid Function Tests: No results for input(s): TSH, T4TOTAL, FREET4, T3FREE, THYROIDAB in the last 72 hours. Anemia Panel: No results for input(s): VITAMINB12, FOLATE, FERRITIN, TIBC, IRON, RETICCTPCT in the last 72 hours. Urine analysis:    Component Value Date/Time   COLORURINE AMBER (A) 07/14/2017 1100   APPEARANCEUR CLOUDY (A) 07/14/2017 1100   LABSPEC 1.025 07/14/2017 1100   PHURINE 5.0 07/14/2017 1100   GLUCOSEU NEGATIVE 07/14/2017 1100   HGBUR SMALL (A) 07/14/2017 1100   BILIRUBINUR MODERATE (A) 07/14/2017 1100   KETONESUR 5 (A) 07/14/2017 1100   PROTEINUR 100 (A) 07/14/2017 1100   UROBILINOGEN 0.2 05/13/2014 1250   NITRITE NEGATIVE 07/14/2017 1100   LEUKOCYTESUR MODERATE (A) 07/14/2017 1100    Radiological Exams on Admission: Ct Abdomen Pelvis W Contrast  Result Date: 07/14/2017 CLINICAL DATA:  Patient recently admitted for pancreatitis. Recurrent epigastric pain. EXAM: CT ABDOMEN AND PELVIS WITH CONTRAST TECHNIQUE: Multidetector CT imaging of the abdomen and pelvis was performed using the standard protocol following bolus administration of intravenous contrast. CONTRAST:  123m OMNIPAQUE IOHEXOL 300 MG/ML  SOLN COMPARISON:  Jun 27, 2017 FINDINGS: Lower chest: No acute abnormality. Hepatobiliary: Hepatic steatosis.  The liver, portal vein, and gallbladder are otherwise normal. Pancreas: There is a fluid collection in the head of the pancreas on series 3, image 35 measuring 18 by 10 mm, not seen in December 2014 and larger since May of 2019. This is probably sequela of the  recent pancreatitis given the short interval. The fluid collection between the pancreatic neck and left hepatic lobe is higher in attenuation and smaller measuring 13 mm today versus 18 mm previously. There are calcifications in the pancreatic head consistent with chronic pancreatitis. The pancreatic tail and most of the body are normal. There is increased attenuation in the fat of the mesentery adjacent to the pancreas. There is a developing collection on series 3, image 34 measuring 4.3 x 2.4 cm. The attenuation is mixed but probably contains at least some thick fluid. There is a high attenuation focal region in the anterior mesentery on series 3, image 36 measuring 3.1 x 4.3 cm with an attenuation of 25 Hounsfield units. The fluid adjacent to the duodenum and pancreatic head on the previous study has resolved. The patient's lipase is normal today. Spleen: Normal in size without focal abnormality. Adrenals/Urinary Tract: Adrenal glands are normal. A lesion off the upper pole the right kidney on series 3, image 28 measures 13 mm today and 12 mm on the previous study, unchanged given difference in slice selection. The attenuation is 46 Hounsfield units. Several tiny cysts are seen in both kidneys, some too small to characterize. At least 1 appears hyperdense on the left. No hydronephrosis. No ureterectasis or ureteral stones. The bladder is normal. Stomach/Bowel: The stomach is normal. There may be some mild persistent inflammation of the distal second portion of the duodenum. The small bowel is otherwise normal. The colon is normal. Previous appendectomy. Vascular/Lymphatic: Atherosclerotic changes are seen in the nonaneurysmal aorta. No adenopathy.  Reproductive: Uterus and bilateral adnexa are unremarkable. Other: No abdominal wall hernia or abnormality. No abdominopelvic ascites. Musculoskeletal: No acute or significant osseous findings. IMPRESSION: 1. The developing masslike regions in the mesentery anterior and inferior to the pancreas are likely sequela of recent pancreatitis. They demonstrate mixed attenuation and I suspect they represent developing thick fluid collections. There is some mild surrounding stranding remaining. 2. There is a 18 x 10 mm cyst in the pancreatic head which is larger in the interval, likely sequela of recent pancreatitis. 3. A 13 mm mass off the upper pole the right kidney is likely a hyperdense cyst but nonspecific. Recommend correlation with ultrasound for confirmation of its cystic nature. 4. Probable mild persistent inflammation of the distal second portion of the duodenum from recent pancreatitis. 5. Atherosclerosis in the aorta. Electronically Signed   By: Dorise Bullion III M.D   On: 07/14/2017 20:05    EKG: Independently reviewed.  Assessment/Plan Principal Problem:   Acute on chronic pancreatitis (HCC) Active Problems:   Diabetes mellitus, type II (HCC)   Essential hypertension   CAD (coronary artery disease)   AKI (acute kidney injury) ([place])   Pancreatic pseudocyst    1. Acute on chronic pancreatitis - 1. IVF: NS at 125 cc/hr 2. Pain ctrl with Morphine 3. Nausea ctrl with zofran 4. NPO 2. Pancreatic pseudocyst - 1. Call GI in AM for consult 3. AKI - 1. Likely pre-renal due to decreased PO intake 2. IVF 3. Hold lisinopril / HCTZ 4. Repeat CMP in AM 4. UA is actually ? UTI 1. No SIRS, no dysuria though 2. Will hold off on ABx for now, culture pending. 5. DM2 - 1. Hold home PO hypoglycemics 2. Sensitive SSI Q4H 6. HTN - 1. BPs running on low side 2. Hold home BP meds 7. CAD - 1. Continue ASA / Plavix  DVT prophylaxis: Lovenox Code Status: Full Family Communication: No family in  room  Disposition Plan: Home after admit Consults called: None Admission status: Place in obs   [place], [place] [facility] Pager [phone]  If 7AM-7PM, please contact day team taking care of patient [email or web address] Password [facility]  07/14/2017, 9:47 PM

## 2017-07-14 NOTE — ED Provider Notes (Addendum)
[facility] EMERGENCY DEPARTMENT Provider Note   CSN: [id number] Arrival date & time: 07/14/17  1040     History   Chief Complaint No chief complaint on file.   HPI [name] is a 62 y.o. female.  Patient is a 62 year old female with a history of diabetes, peripheral artery disease, coronary artery disease, recent pancreatitis that was treated May 13 and she was discharged 6 days ago and is returning today for worsening abdominal pain and decreased p.o. intake.  Patient states since going home the pain is only worsened.  She describes the epigastric area radiates into her back in her left upper quadrant.  Patient has had persistent nausea but no vomiting.  Patient denies any lower abdominal pain or dysuria.  She has had no fever.  She denies any chest pain or shortness of breath at this time.  Patient has been taking nausea medication at home.  The history is provided by the patient.    Past Medical History:  Diagnosis Date  . Asthma with COPD ([place])   . CAD (coronary artery disease)    a. [place] 2011: Diffuse distal and branch vessel disease - patient managed medically, no interventional options. b. [place] 03/2014 - low risk, no ischemia.  . Complication of anesthesia    slow to wake up with last surgery in 2011   . Diabetes mellitus with circulatory complication ([place])   . Dyslipidemia   . Headache    hx of migraines   . Hypertension   . Osteoarthritis    severe right knee  R TKR  . PAD (peripheral artery disease) ([place])    a. Severe stenosis mid right SFA s/p atherectomy 03/25/09. b. peripheral angiography in 12/2011 which showed only about 50% diffuse right SFA stenosis.  . Pancreatitis 2003  . Renal artery stenosis ([place])    a. [place] 2011 - 40-50% left RAS.  . Tobacco use disorder    quit 11/10    Patient Active Problem List   Diagnosis Date Noted  . Acute pancreatitis 06/27/2017  . Renal artery stenosis ([place])   . CAD (coronary artery disease)   .  Diabetes mellitus with circulatory complication ([place])   . Dyslipidemia   . History of total knee arthroplasty 05/22/2014  . Acute on chronic pancreatitis ([place]) 01/27/2013  . Cutaneous skin tags 09/09/2012  . Bilateral leg pain 07/20/2012  . Nausea and vomiting in adult 10/25/2011  . Arthritis of knee 10/25/2011  . GERD (gastroesophageal reflux disease) 11/04/2010  . Coronary atherosclerosis 03/02/2009  . PAD (peripheral artery disease) ([place]) 01/30/2009  . Diabetes mellitus, type II ([place]) 01/02/2009  . TOBACCO ABUSE 01/02/2009  . Essential hypertension 01/02/2009    Past Surgical History:  Procedure Laterality Date  . ABDOMINAL AORTAGRAM N/A 12/21/2011   Procedure: ABDOMINAL Maxcine Ham;  Surgeon: Wellington Hampshire, MD;  Location: [facility] CATH [facility];  Service: Cardiovascular;  Laterality: N/A;  . CARDIAC CATHETERIZATION    . LOWER EXTREMITY ANGIOGRAM Bilateral 05/27/2015   Procedure: Lower Extremity Angiogram;  Surgeon: Wellington Hampshire, MD;  Location: [facility];  Service: Cardiovascular;  Laterality: Bilateral;  . PERIPHERAL VASCULAR CATHETERIZATION N/A 05/27/2015   Procedure: Abdominal Aortogram;  Surgeon: Wellington Hampshire, MD;  Location: [facility];  Service: Cardiovascular;  Laterality: N/A;  . PERIPHERAL VASCULAR CATHETERIZATION Right 05/27/2015   Procedure: Peripheral Vascular Intervention;  Surgeon: Wellington Hampshire, MD;  Location: [facility];  Service: Cardiovascular;  Laterality: Right;  SFA  .  TOTAL KNEE ARTHROPLASTY Right 2011  . TOTAL KNEE ARTHROPLASTY Left 05/22/2014   Procedure: LEFT TOTAL KNEE ARTHROPLASTY;  Surgeon: Latanya Maudlin, MD;  Location: [facility];  Service: Orthopedics;  Laterality: Left;  . TUBAL LIGATION  1980     OB History   None      Home Medications    Prior to Admission medications   Medication Sig Start Date End Date Taking? Authorizing Provider  ADVAIR DISKUS 250-50 MCG/DOSE AEPB INHALE 1 PUFF INTO THE LUNGS EVERY 12 HOURS 02/02/17    Alycia Rossetti, MD  aspirin 81 MG tablet Take 81 mg by mouth daily.      [provider]  Biotin w/ Vitamins C & E (HAIR/SKIN/NAILS PO) Take 1 tablet by mouth daily.    [provider]  bisoprolol-hydrochlorothiazide [facility]) 2.5-6.25 MG tablet TAKE 1 TABLET BY MOUTH DAILY 06/14/17   Susy Frizzle, MD  Blood Glucose Monitoring Suppl (BAYER CONTOUR MONITOR) w/Device KIT CHECK BLOOD SUGAR EVERY DAY 08/20/15   Susy Frizzle, MD  clopidogrel (PLAVIX) 75 MG tablet Take 1 tablet (75 mg total) by mouth daily. 04/06/17   Wellington Hampshire, MD  CONTOUR NEXT TEST test strip CHECK BLOOD SUGAR EVERY DAY 09/05/16   Susy Frizzle, MD  diclofenac sodium (VOLTAREN) 1 % GEL Apply 2 g topically 4 (four) times daily. Patient taking differently: Apply 2 g topically 4 (four) times daily as needed (pain).  05/04/16   Susy Frizzle, MD  fish oil-omega-3 fatty acids 1000 MG capsule Take 2 capsules (2 g total) by mouth 2 (two) times daily. 02/03/12   Larey Dresser, MD  glimepiride (AMARYL) 4 MG tablet TAKE 1 TABLET BY MOUTH EVERY DAY WITH LUNCH 04/24/17   Susy Frizzle, MD  HYDROcodone-acetaminophen (NORCO/VICODIN) 5-325 MG tablet Take 1 tablet by mouth 4 (four) times daily. Patient taking differently: Take 1 tablet by mouth 4 (four) times daily as needed for moderate pain.  06/20/17   Susy Frizzle, MD  isosorbide mononitrate (IMDUR) 60 MG 24 hr tablet TAKE 1 TABLET BY MOUTH DAILY 05/29/17   Susy Frizzle, MD  lisinopril (PRINIVIL,ZESTRIL) 10 MG tablet TAKE 1 TABLET BY MOUTH DAILY 05/29/17   Susy Frizzle, MD  metFORMIN (GLUCOPHAGE) 1000 MG tablet TAKE 1 TABLET BY MOUTH TWICE DAILY WITH MEALS 04/14/17   Susy Frizzle, MD  methocarbamol (ROBAXIN) 500 MG tablet TAKE 1 TABLET BY MOUTH EVERY 6 HOURS AS NEEDED FOR MUSCLE SPASMS 06/16/17   Susy Frizzle, MD  Multiple Vitamin (MULTIVITAMIN) tablet Take 1 tablet by mouth daily.      [provider]  ondansetron (ZOFRAN) 4 MG  tablet TAKE 1 TABLET(4 MG) BY MOUTH EVERY 8 HOURS AS NEEDED FOR NAUSEA OR VOMITING 06/27/17   Susy Frizzle, MD  pantoprazole (PROTONIX) 40 MG tablet TAKE 1 TABLET BY MOUTH DAILY 04/03/17   Susy Frizzle, MD  pioglitazone (ACTOS) 30 MG tablet TAKE 1 TABLET(30 MG) BY MOUTH DAILY 12/26/16   Susy Frizzle, MD  Probiotic Product (TRUBIOTICS PO) Take 1 capsule by mouth daily.    [provider]  rosuvastatin (CRESTOR) 40 MG tablet TAKE 1 TABLET BY MOUTH DAILY 06/20/17   Susy Frizzle, MD  VENTOLIN HFA 108 (90 Base) MCG/ACT inhaler INHALE 2 PUFFS BY MOUTH TWICE DAILY AS NEEDED FOR WHEEZING OR SHORTNESS OF BREATH 08/16/16   Susy Frizzle, MD    Family History Family History  Problem Relation Age of Onset  .  CVA Mother   . Emphysema Father   . Breast cancer Sister 13    Social History Social History   Tobacco Use  . Smoking status: Former Smoker    Last attempt to quit: 12/13/2008    Years since quitting: 8.5  . Smokeless tobacco: Never Used  Substance Use Topics  . Alcohol use: No  . Drug use: No     Allergies   Gabapentin; Glucotrol [glipizide]; Influenza vaccines; Latex; Milk-related compounds; Nortriptyline; Tetanus toxoid; and Invokana [canagliflozin]   Review of Systems Review of Systems  All other systems reviewed and are negative.    Physical Exam Updated Vital Signs BP (!) 102/58   Pulse 79   Temp (!) 97.5 F (36.4 C) (Oral)   Resp 16   SpO2 98%   Physical Exam  Constitutional: She is oriented to person, place, and time. She appears well-developed and well-nourished. No distress.  HENT:  Head: Normocephalic and atraumatic.  Mouth/Throat: Oropharynx is clear and moist.  Eyes: Pupils are equal, round, and reactive to light. Conjunctivae and EOM are normal.  Neck: Normal range of motion. Neck supple.  Cardiovascular: Normal rate, regular rhythm and intact distal pulses.  No murmur heard. Pulmonary/Chest: Effort normal and breath sounds  normal. No respiratory distress. She has no wheezes. She has no rales.  Abdominal: Soft. She exhibits no distension. There is tenderness in the periumbilical area and left upper quadrant. There is guarding. There is no rebound.  Musculoskeletal: Normal range of motion. She exhibits no edema or tenderness.  Neurological: She is alert and oriented to person, place, and time.  Skin: Skin is warm and dry. No rash noted. No erythema.  Psychiatric: She has a normal mood and affect. Her behavior is normal.  Nursing note and vitals reviewed.    ED Treatments / Results  Labs (all labs ordered are listed, but only abnormal results are displayed) Labs Reviewed  COMPREHENSIVE METABOLIC PANEL - Abnormal; Notable for the following components:      Result Value   Chloride 98 (*)    Glucose, Bld 140 (*)    BUN 29 (*)    Creatinine, Ser 1.64 (*)    GFR calc non Af Amer 33 (*)    GFR calc Af Amer 38 (*)    All other components within normal limits  CBC - Abnormal; Notable for the following components:   Hemoglobin 11.6 (*)    Platelets 440 (*)    All other components within normal limits  URINALYSIS, ROUTINE W REFLEX MICROSCOPIC - Abnormal; Notable for the following components:   Color, Urine AMBER (*)    APPearance CLOUDY (*)    Hgb urine dipstick SMALL (*)    Bilirubin Urine MODERATE (*)    Ketones, ur 5 (*)    Protein, ur 100 (*)    Leukocytes, UA MODERATE (*)    WBC, UA >50 (*)    Bacteria, UA RARE (*)    All other components within normal limits  LIPASE, BLOOD    EKG None  Radiology Ct Abdomen Pelvis W Contrast  Result Date: 07/14/2017 CLINICAL DATA:  Patient recently admitted for pancreatitis. Recurrent epigastric pain. EXAM: CT ABDOMEN AND PELVIS WITH CONTRAST TECHNIQUE: Multidetector CT imaging of the abdomen and pelvis was performed using the standard protocol following bolus administration of intravenous contrast. CONTRAST:  164m OMNIPAQUE IOHEXOL 300 MG/ML  SOLN COMPARISON:   Jun 27, 2017 FINDINGS: Lower chest: No acute abnormality. Hepatobiliary: Hepatic steatosis. The liver, portal vein, and gallbladder  are otherwise normal. Pancreas: There is a fluid collection in the head of the pancreas on series 3, image 35 measuring 18 by 10 mm, not seen in December 2014 and larger since May of 2019. This is probably sequela of the recent pancreatitis given the short interval. The fluid collection between the pancreatic neck and left hepatic lobe is higher in attenuation and smaller measuring 13 mm today versus 18 mm previously. There are calcifications in the pancreatic head consistent with chronic pancreatitis. The pancreatic tail and most of the body are normal. There is increased attenuation in the fat of the mesentery adjacent to the pancreas. There is a developing collection on series 3, image 34 measuring 4.3 x 2.4 cm. The attenuation is mixed but probably contains at least some thick fluid. There is a high attenuation focal region in the anterior mesentery on series 3, image 36 measuring 3.1 x 4.3 cm with an attenuation of 25 Hounsfield units. The fluid adjacent to the duodenum and pancreatic head on the previous study has resolved. The patient's lipase is normal today. Spleen: Normal in size without focal abnormality. Adrenals/Urinary Tract: Adrenal glands are normal. A lesion off the upper pole the right kidney on series 3, image 28 measures 13 mm today and 12 mm on the previous study, unchanged given difference in slice selection. The attenuation is 46 Hounsfield units. Several tiny cysts are seen in both kidneys, some too small to characterize. At least 1 appears hyperdense on the left. No hydronephrosis. No ureterectasis or ureteral stones. The bladder is normal. Stomach/Bowel: The stomach is normal. There may be some mild persistent inflammation of the distal second portion of the duodenum. The small bowel is otherwise normal. The colon is normal. Previous appendectomy.  Vascular/Lymphatic: Atherosclerotic changes are seen in the nonaneurysmal aorta. No adenopathy. Reproductive: Uterus and bilateral adnexa are unremarkable. Other: No abdominal wall hernia or abnormality. No abdominopelvic ascites. Musculoskeletal: No acute or significant osseous findings. IMPRESSION: 1. The developing masslike regions in the mesentery anterior and inferior to the pancreas are likely sequela of recent pancreatitis. They demonstrate mixed attenuation and I suspect they represent developing thick fluid collections. There is some mild surrounding stranding remaining. 2. There is a 18 x 10 mm cyst in the pancreatic head which is larger in the interval, likely sequela of recent pancreatitis. 3. A 13 mm mass off the upper pole the right kidney is likely a hyperdense cyst but nonspecific. Recommend correlation with ultrasound for confirmation of its cystic nature. 4. Probable mild persistent inflammation of the distal second portion of the duodenum from recent pancreatitis. 5. Atherosclerosis in the aorta. Electronically Signed   By: Dorise Bullion III M.D   On: 07/14/2017 20:05    Procedures Procedures (including critical care time)  Medications Ordered in ED Medications  oxyCODONE-acetaminophen (PERCOCET/ROXICET) 5-325 MG per tablet 1 tablet (1 tablet Oral Given 07/14/17 1556)  ondansetron (ZOFRAN) injection 4 mg (has no administration in time range)  morphine 4 MG/ML injection 6 mg (has no administration in time range)  sodium chloride 0.9 % bolus 1,000 mL (has no administration in time range)  0.9 %  sodium chloride infusion (has no administration in time range)     Initial Impression / Assessment and Plan / ED Course  I have reviewed the triage vital signs and the nursing notes.  Pertinent labs & imaging results that were available during my care of the patient were reviewed by me and considered in my medical decision making (see chart for  details).     62 year old female with  known pancreatitis recently admitted 6 days ago and discharged for pancreatitis acute on chronic.  Patient continues to have pain and nausea and unable to tolerate p.o.'s.  Patient's labs today with new AKI with a creatinine of 1.64 which is most likely related to prerenal dehydration from poor p.o. intake.  Patient's liver function test within normal limits.  Lipase within normal limits, CBC with normal white count and stable hemoglobin.  Patient's urine looks as if it could be infected with greater than 50 white blood cells and moderate leukocytes with rare bacteria but she denies any dysuria, frequency or urgency.  Will give IV fluids, pain and nausea control.  We will do a repeat CT to ensure no evidence of pseudocyst or necrosis.  8:27 PM Patient CT showed developing masslike region in the mesenteric anterior and inferior to the pancreas which are most likely sequela of recent pancreatitis look most suggestive of a thick fluid collection.  There is also a cyst in the pancreatic head which was larger than prior.  We will continue IV fluids at this time.  Will admit patient for persistent pancreatitis and complications.  Patient currently is pain controlled after getting only 1 dose of 6 mg of morphine.  CRITICAL CARE Performed by: Ercil Cassis Total critical care time: 30 minutes Critical care time was exclusive of separately billable procedures and treating other patients. Critical care was necessary to treat or prevent imminent or life-threatening deterioration. Critical care was time spent personally by me on the following activities: development of treatment plan with patient and/or surrogate as well as nursing, discussions with consultants, evaluation of patient's response to treatment, examination of patient, obtaining history from patient or surrogate, ordering and performing treatments and interventions, ordering and review of laboratory studies, ordering and review of radiographic studies,  pulse oximetry and re-evaluation of patient's condition.    Final Clinical Impressions(s) / ED Diagnoses   Final diagnoses:  Acute pancreatitis, unspecified complication status, unspecified pancreatitis type  AKI (acute kidney injury) [facility])    ED Discharge Orders    None       Blanchie Dessert, MD 07/14/17 2028    Blanchie Dessert, MD 07/31/17 2328

## 2017-07-14 NOTE — ED Notes (Signed)
Pt reports having left upper quadrant pain for the past 2 weeks. PT reports being diagnosed with pancreatitis.

## 2017-07-14 NOTE — ED Triage Notes (Signed)
Patient was recently admitted for pancreatitis. This week has had recurrent pain to epigastric area with decreased appetite and nausea. No ETOH, Non-smoker. Alert and oriented

## 2017-07-14 NOTE — Progress Notes (Signed)
Subjective:    Patient ID: [name], female    DOB: 27-Dec-1955, 62 y.o.   MRN: [id number]  HPI  Patient was recently discharged from the hospital after being treated for chronic pancreatitis.  I have copied relevant portions of the discharge summary and included them below for my reference:  Admit date: 06/26/2017 Discharge date: 06/30/2017  Admitted From home Disposition: home  Recommendations for Outpatient Follow-up:  1. Follow up with PCP in 1-2 weeks 2. Please obtain BMP/CBC in one week  Home Health none Equipment/Devices: None Discharge Condition: Stable  CODE STATUS full Diet recommendation carb modified Brief/Interim Summary::62 y.o.Fwith COPD not on home O2, CAD ,DM, HTN, and chronic pancreatitis who presents with epigastric pain   Discharge Diagnoses:  Principal Problem:   Acute on chronic pancreatitis ([facility]) Active Problems:   TOBACCO ABUSE   Essential hypertension   PAD (peripheral artery disease) (HCC)   GERD (gastroesophageal reflux disease)   Renal artery stenosis (HCC)   CAD (coronary artery disease)   Diabetes mellitus with circulatory complication (HCC)  Acute on chronic pancreatitis-patient was treated with n.p.o. IV hydration and pain control.  Her symptoms improved with the above treatment.  He was started on clear liquid diet diet was advanced to regular diet she was able to tolerate the regular diet on the day of discharge.   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 This was discussed with Radiology who felt the significance was unclear, but that: -Pancreatic protocol CT in 3-6 months to reimage would not be unreasonable in this patient with risk factors (age, smoking, chronic pancreatitis).  Coronary disease Hypertension -Hold home lisinopril, BB, HCTZ -Continue Imdur -Resume aspirin, Plavix, statin  Diabetes -Hold metformin and glimepiride and  pioglitazone -SSI with meals  Tobacco use Patient states she has been off tobacco for 8 years  COPD  No active disease. -Continue Advair as a formulary alternative Dulera  07/07/17 Patient is here today for follow-up.  She states that she still has some mid abdominal tenderness.  She is able to eat and keep down solid food however she is experiencing early satiety.  She will eat 2 or 3 bites and feel completely full with no desire to eat more.  She is drinking very little.  She denies any fevers or chills.  She denies any chest pain.  She denies any trouble breathing.  She denies any nausea or vomiting.  She denies any melena or hematochezia.  Review of her lab work from the hospital shows a drop in her hemoglobin from 12-10 which was likely due to dehydration and aggressive IV fluid replacement.  This is due to be followed up.  She states that her blood sugars are 130s in the morning fasting.  2-hour postprandial sugars are under 200.  At that time, my plan was: Patient seems to be improving.  I have recommended rechecking her in 3 to 4 weeks to ensure complete and total resolution and that she returns back to her baseline.  I would repeat a CT scan of the abdomen and pelvis in 3 months to ensure resolution of the peripancreatic cyst/fluid collection seen on CT.  I reviewed the CT findings with the patient and showed her the fluid collection on CT and explained the necessity for follow-up to rule out underlying pancreatic malignancy.  Hemoglobin A1c was found to be 6.7 prior to hospitalization and well-controlled.  I will repeat a CBC to ensure that the  drop in hemoglobin was simply due to rehydration and that there is no persistent drop.  I will also repeat a CMP to monitor her renal function and potassium given the fact she is taking very little oral intake due to early satiety.  I encouraged 3 to 4 glasses of water a day and gradually increasing her diet as tolerated.  Recheck sooner if  worsening  07/14/17 Patient's pain is worsening.  She rates her pain at a 9 to a 10 on a scale of 10.  She states that the pain is a bandlike sensation around her epigastric area that radiates directly into her back.  She is only eating clear liquids and broth and is unable to tolerate any oral intake due to nausea.  As soon as she eats she developed severe pain.  She denies any fevers or chills.  She denies any diarrhea.  She denies any hematemesis, hematochezia, melena.  She denies any fevers.  She denies any shortness of breath or cough.  She is using her pain medication 2 tablets every 4-6 hours without any relief. Past Medical History:  Diagnosis Date  . Asthma with COPD ([place])   . CAD (coronary artery disease)    a. [place] 2011: Diffuse distal and branch vessel disease - patient managed medically, no interventional options. b. [place] 03/2014 - low risk, no ischemia.  . Complication of anesthesia    slow to wake up with last surgery in 2011   . Diabetes mellitus with circulatory complication ([place])   . Dyslipidemia   . Headache    hx of migraines   . Hypertension   . Osteoarthritis    severe right knee  R TKR  . PAD (peripheral artery disease) ([place])    a. Severe stenosis mid right SFA s/p atherectomy 03/25/09. b. peripheral angiography in 12/2011 which showed only about 50% diffuse right SFA stenosis.  . Pancreatitis 2003  . Renal artery stenosis ([place])    a. [place] 2011 - 40-50% left RAS.  . Tobacco use disorder    quit 11/10   Past Surgical History:  Procedure Laterality Date  . ABDOMINAL AORTAGRAM N/A 12/21/2011   Procedure: ABDOMINAL Maxcine Ham;  Surgeon: Wellington Hampshire, MD;  Location: [facility];  Service: Cardiovascular;  Laterality: N/A;  . CARDIAC CATHETERIZATION    . LOWER EXTREMITY ANGIOGRAM Bilateral 05/27/2015   Procedure: Lower Extremity Angiogram;  Surgeon: Wellington Hampshire, MD;  Location: [facility];  Service: Cardiovascular;  Laterality: Bilateral;  . PERIPHERAL  VASCULAR CATHETERIZATION N/A 05/27/2015   Procedure: Abdominal Aortogram;  Surgeon: Wellington Hampshire, MD;  Location: [facility];  Service: Cardiovascular;  Laterality: N/A;  . PERIPHERAL VASCULAR CATHETERIZATION Right 05/27/2015   Procedure: Peripheral Vascular Intervention;  Surgeon: Wellington Hampshire, MD;  Location: [facility];  Service: Cardiovascular;  Laterality: Right;  SFA  . TOTAL KNEE ARTHROPLASTY Right 2011  . TOTAL KNEE ARTHROPLASTY Left 05/22/2014   Procedure: LEFT TOTAL KNEE ARTHROPLASTY;  Surgeon: Latanya Maudlin, MD;  Location: [facility];  Service: Orthopedics;  Laterality: Left;  . TUBAL LIGATION  1980   Current Outpatient Medications on File Prior to Visit  Medication Sig Dispense Refill  . ADVAIR DISKUS 250-50 MCG/DOSE AEPB INHALE 1 PUFF INTO THE LUNGS EVERY 12 HOURS 60 each 0  . aspirin 81 MG tablet Take 81 mg by mouth daily.      . Biotin w/ Vitamins C & E (HAIR/SKIN/NAILS PO) Take 1 tablet by mouth daily.    Marland Kitchen  bisoprolol-hydrochlorothiazide (ZIAC) 2.5-6.25 MG tablet TAKE 1 TABLET BY MOUTH DAILY 90 tablet 2  . Blood Glucose Monitoring Suppl (BAYER CONTOUR MONITOR) w/Device KIT CHECK BLOOD SUGAR EVERY DAY 1 kit 0  . clopidogrel (PLAVIX) 75 MG tablet Take 1 tablet (75 mg total) by mouth daily. 30 tablet 11  . CONTOUR NEXT TEST test strip CHECK BLOOD SUGAR EVERY DAY 100 each 5  . diclofenac sodium (VOLTAREN) 1 % GEL Apply 2 g topically 4 (four) times daily. (Patient taking differently: Apply 2 g topically 4 (four) times daily as needed (pain). ) 100 g 0  . fish oil-omega-3 fatty acids 1000 MG capsule Take 2 capsules (2 g total) by mouth 2 (two) times daily. 360 capsule 3  . glimepiride (AMARYL) 4 MG tablet TAKE 1 TABLET BY MOUTH EVERY DAY WITH LUNCH 90 tablet 1  . HYDROcodone-acetaminophen (NORCO/VICODIN) 5-325 MG tablet Take 1 tablet by mouth 4 (four) times daily. (Patient taking differently: Take 1 tablet by mouth 4 (four) times daily as needed for moderate pain. ) 120  tablet 0  . isosorbide mononitrate (IMDUR) 60 MG 24 hr tablet TAKE 1 TABLET BY MOUTH DAILY 90 tablet 2  . lisinopril (PRINIVIL,ZESTRIL) 10 MG tablet TAKE 1 TABLET BY MOUTH DAILY 90 tablet 2  . metFORMIN (GLUCOPHAGE) 1000 MG tablet TAKE 1 TABLET BY MOUTH TWICE DAILY WITH MEALS 180 tablet 1  . methocarbamol (ROBAXIN) 500 MG tablet TAKE 1 TABLET BY MOUTH EVERY 6 HOURS AS NEEDED FOR MUSCLE SPASMS 120 tablet 0  . Multiple Vitamin (MULTIVITAMIN) tablet Take 1 tablet by mouth daily.      . ondansetron (ZOFRAN) 4 MG tablet TAKE 1 TABLET(4 MG) BY MOUTH EVERY 8 HOURS AS NEEDED FOR NAUSEA OR VOMITING 30 tablet 0  . pantoprazole (PROTONIX) 40 MG tablet TAKE 1 TABLET BY MOUTH DAILY 90 tablet 3  . pioglitazone (ACTOS) 30 MG tablet TAKE 1 TABLET(30 MG) BY MOUTH DAILY 30 tablet 3  . Probiotic Product (TRUBIOTICS PO) Take 1 capsule by mouth daily.    . rosuvastatin (CRESTOR) 40 MG tablet TAKE 1 TABLET BY MOUTH DAILY 30 tablet 0  . VENTOLIN HFA 108 (90 Base) MCG/ACT inhaler INHALE 2 PUFFS BY MOUTH TWICE DAILY AS NEEDED FOR WHEEZING OR SHORTNESS OF BREATH 54 g 3   No current facility-administered medications on file prior to visit.    Allergies  Allergen Reactions  . Gabapentin Other (See Comments)    Brain shakes  . Glucotrol [Glipizide] Rash and Other (See Comments)    Red rash  . Influenza Vaccines Other (See Comments)    Significant arm soreness requiring 1 year of physical therapy  . Latex Rash  . Milk-Related Compounds Rash and Other (See Comments)    Fever  . Nortriptyline Other (See Comments)    BRAIN SHAKE  . Tetanus Toxoid Hives, Swelling, Rash and Other (See Comments)    Swelling to site of injection with rash and fever  . Invokana [Canagliflozin] Other (See Comments)    Yeast Infections   Social History   Socioeconomic History  . Marital status: Married    Spouse name: Not on file  . Number of children: Not on file  . Years of education: 30  . Highest education level: Not on file   Occupational History  . Not on file  Social Needs  . Financial resource strain: Not on file  . Food insecurity:    Worry: Not on file    Inability: Not on file  . Transportation needs:  Medical: Not on file    Non-medical: Not on file  Tobacco Use  . Smoking status: Former Smoker    Last attempt to quit: 12/13/2008    Years since quitting: 8.5  . Smokeless tobacco: Never Used  Substance and Sexual Activity  . Alcohol use: No  . Drug use: No  . Sexual activity: Not on file  Lifestyle  . Physical activity:    Days per week: Not on file    Minutes per session: Not on file  . Stress: Not on file  Relationships  . Social connections:    Talks on phone: Not on file    Gets together: Not on file    Attends religious service: Not on file    Active member of club or organization: Not on file    Attends meetings of clubs or organizations: Not on file    Relationship status: Not on file  . Intimate partner violence:    Fear of current or ex partner: Not on file    Emotionally abused: Not on file    Physically abused: Not on file    Forced sexual activity: Not on file  Other Topics Concern  . Not on file  Social History Narrative   Denies any IV drug use or marijuana use.  Former smoker for 30 years, quit Nov. 2010. She is married with 2 children. Disabled from knee, not working.     Review of Systems  All other systems reviewed and are negative.      Objective:   Physical Exam  Constitutional: She appears well-developed and well-nourished. No distress.  Neck: Normal range of motion. Neck supple. No JVD present. No thyromegaly present.  Cardiovascular: Normal rate, regular rhythm and normal heart sounds. Exam reveals no gallop and no friction rub.  No murmur heard. Pulmonary/Chest: Effort normal and breath sounds normal. No stridor. No respiratory distress. She has no wheezes. She has no rales.  Abdominal: Soft. Bowel sounds are normal. She exhibits no distension and  no mass. There is tenderness. There is guarding. There is no rebound. No hernia.  Lymphadenopathy:    She has no cervical adenopathy.  Skin: She is not diaphoretic.  Vitals reviewed.         Assessment & Plan:  Acute pancreatitis, unspecified complication status, unspecified pancreatitis type  Patient appears to have acute on chronic pancreatitis.  She is on clear liquids and still unable to control her pain.  Therefore I do not see how we can manage this as an outpatient without making her n.p.o. and allowing complete and total bowel rest.  She is unable to do this at home due to dehydration and nutrition requirements.  Therefore I believe she needs to be admitted to the hospital for IV fluid and possibly parenteral nutrition until resolution of the pancreatitis.  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  Therefore I recommended that she go to the emergency room for evaluation and treatment

## 2017-07-15 DIAGNOSIS — Z96653 Presence of artificial knee joint, bilateral: Secondary | ICD-10-CM | POA: Diagnosis present

## 2017-07-15 DIAGNOSIS — K863 Pseudocyst of pancreas: Secondary | ICD-10-CM | POA: Diagnosis present

## 2017-07-15 DIAGNOSIS — D638 Anemia in other chronic diseases classified elsewhere: Secondary | ICD-10-CM | POA: Diagnosis present

## 2017-07-15 DIAGNOSIS — Z87891 Personal history of nicotine dependence: Secondary | ICD-10-CM | POA: Diagnosis not present

## 2017-07-15 DIAGNOSIS — E1151 Type 2 diabetes mellitus with diabetic peripheral angiopathy without gangrene: Secondary | ICD-10-CM | POA: Diagnosis present

## 2017-07-15 DIAGNOSIS — R1010 Upper abdominal pain, unspecified: Secondary | ICD-10-CM | POA: Diagnosis not present

## 2017-07-15 DIAGNOSIS — N183 Chronic kidney disease, stage 3 (moderate): Secondary | ICD-10-CM | POA: Diagnosis present

## 2017-07-15 DIAGNOSIS — I700 Atherosclerosis of aorta: Secondary | ICD-10-CM | POA: Diagnosis present

## 2017-07-15 DIAGNOSIS — I129 Hypertensive chronic kidney disease with stage 1 through stage 4 chronic kidney disease, or unspecified chronic kidney disease: Secondary | ICD-10-CM | POA: Diagnosis present

## 2017-07-15 DIAGNOSIS — E1122 Type 2 diabetes mellitus with diabetic chronic kidney disease: Secondary | ICD-10-CM | POA: Diagnosis present

## 2017-07-15 DIAGNOSIS — Z7984 Long term (current) use of oral hypoglycemic drugs: Secondary | ICD-10-CM | POA: Diagnosis not present

## 2017-07-15 DIAGNOSIS — Z7951 Long term (current) use of inhaled steroids: Secondary | ICD-10-CM | POA: Diagnosis not present

## 2017-07-15 DIAGNOSIS — Z79899 Other long term (current) drug therapy: Secondary | ICD-10-CM | POA: Diagnosis not present

## 2017-07-15 DIAGNOSIS — E785 Hyperlipidemia, unspecified: Secondary | ICD-10-CM | POA: Diagnosis present

## 2017-07-15 DIAGNOSIS — N179 Acute kidney failure, unspecified: Secondary | ICD-10-CM | POA: Diagnosis present

## 2017-07-15 DIAGNOSIS — K859 Acute pancreatitis without necrosis or infection, unspecified: Secondary | ICD-10-CM | POA: Diagnosis present

## 2017-07-15 DIAGNOSIS — Z7982 Long term (current) use of aspirin: Secondary | ICD-10-CM | POA: Diagnosis not present

## 2017-07-15 DIAGNOSIS — I2510 Atherosclerotic heart disease of native coronary artery without angina pectoris: Secondary | ICD-10-CM | POA: Diagnosis present

## 2017-07-15 DIAGNOSIS — R6881 Early satiety: Secondary | ICD-10-CM | POA: Diagnosis present

## 2017-07-15 DIAGNOSIS — K861 Other chronic pancreatitis: Secondary | ICD-10-CM | POA: Diagnosis present

## 2017-07-15 DIAGNOSIS — E860 Dehydration: Secondary | ICD-10-CM | POA: Diagnosis present

## 2017-07-15 DIAGNOSIS — R110 Nausea: Secondary | ICD-10-CM | POA: Diagnosis not present

## 2017-07-15 DIAGNOSIS — R630 Anorexia: Secondary | ICD-10-CM | POA: Diagnosis not present

## 2017-07-15 DIAGNOSIS — N281 Cyst of kidney, acquired: Secondary | ICD-10-CM | POA: Diagnosis present

## 2017-07-15 DIAGNOSIS — Z9862 Peripheral vascular angioplasty status: Secondary | ICD-10-CM | POA: Diagnosis not present

## 2017-07-15 DIAGNOSIS — J449 Chronic obstructive pulmonary disease, unspecified: Secondary | ICD-10-CM | POA: Diagnosis present

## 2017-07-15 LAB — COMPREHENSIVE METABOLIC PANEL
ALK PHOS: 57 U/L (ref 38–126)
ALT: 16 U/L (ref 14–54)
AST: 20 U/L (ref 15–41)
Albumin: 3.2 g/dL — ABNORMAL LOW (ref 3.5–5.0)
Anion gap: 10 (ref 5–15)
BUN: 18 mg/dL (ref 6–20)
CALCIUM: 9.2 mg/dL (ref 8.9–10.3)
CHLORIDE: 106 mmol/L (ref 101–111)
CO2: 25 mmol/L (ref 22–32)
CREATININE: 1.17 mg/dL — AB (ref 0.44–1.00)
GFR calc Af Amer: 57 mL/min — ABNORMAL LOW (ref >60)
GFR, EST NON AFRICAN AMERICAN: 50 mL/min — AB (ref >60)
Glucose, Bld: 95 mg/dL (ref 65–99)
Potassium: 4 mmol/L (ref 3.5–5.1)
Sodium: 141 mmol/L (ref 135–145)
Total Bilirubin: 0.5 mg/dL (ref 0.3–1.2)
Total Protein: 6.1 g/dL — ABNORMAL LOW (ref 6.5–8.1)

## 2017-07-15 LAB — GLUCOSE, CAPILLARY
GLUCOSE-CAPILLARY: 164 mg/dL — AB (ref 65–99)
GLUCOSE-CAPILLARY: 82 mg/dL (ref 65–99)
GLUCOSE-CAPILLARY: 79 mg/dL (ref 65–99)
GLUCOSE-CAPILLARY: 125 mg/dL — AB (ref 65–99)
GLUCOSE-CAPILLARY: 131 mg/dL — AB (ref 65–99)
Glucose-Capillary: 51 mg/dL — ABNORMAL LOW (ref 65–99)

## 2017-07-15 LAB — CBC
HEMATOCRIT: 34.3 % — AB (ref 36.0–46.0)
HEMOGLOBIN: 10.5 g/dL — AB (ref 12.0–15.0)
MCH: 27.9 pg (ref 26.0–34.0)
MCHC: 30.6 g/dL (ref 30.0–36.0)
MCV: 91 fL (ref 78.0–100.0)
PLATELETS: 349 10*3/uL (ref 150–400)
RBC: 3.77 MIL/uL — AB (ref 3.87–5.11)
RDW: 14.7 % (ref 11.5–15.5)
WBC: 5 10*3/uL (ref 4.0–10.5)

## 2017-07-15 LAB — CBG MONITORING, ED: Glucose-Capillary: 104 mg/dL — ABNORMAL HIGH (ref 65–99)

## 2017-07-15 MED ORDER — DEXTROSE 50 % IV SOLN
INTRAVENOUS | Status: AC
  Administered 2017-07-15: 50 mL
  Filled 2017-07-15: qty 50

## 2017-07-15 MED ORDER — PHENOL 1.4 % MT LIQD
1.0000 | OROMUCOSAL | Status: DC | PRN
  Administered 2017-07-15: 1 via OROMUCOSAL
  Filled 2017-07-15: qty 177

## 2017-07-15 NOTE — ED Notes (Signed)
Attempted report X1

## 2017-07-15 NOTE — Progress Notes (Signed)
Pt's CBG 51. D5 was given per protocol since pt is NPO. CBG was rechecked: 161. MD was notified because this is the second time of CBG drop. No orders were given. RN will continue to monitor.

## 2017-07-15 NOTE — Progress Notes (Signed)
[facility]                                                                              Patient Demographics  [name], is a 62 y.o. female, DOB - 12/05/1955, ZOX:[id number]  Admit date - 07/14/2017   Admitting Physician Hillary Bow, DO  Outpatient Primary MD for the patient is Pickard, Priscille Heidelberg, MD  Outpatient specialists:   LOS - 0  days    No chief complaint on file.      Brief summary   [name] is a 62 y.o. female with medical history significant for but not limited to DM2, CAD, recent acute pancreatitis treated from May 13-17 presenting with worsening epigastric pain radiating to back and LUQ, associated with nausea without vomitting and decreased oral intake.  ED Eval revealed Lipase 40, Creat 1.6 up from baseline 1.0.  CT shows development of pseudocyst and fluid collections around pancreas.        Assessment & Plan    Principal Problem:   Acute on chronic pancreatitis (HCC) Active Problems:   Diabetes mellitus, type II (HCC)   Essential hypertension   CAD (coronary artery disease)   AKI (acute kidney injury) (HCC)   Pancreatic pseudocyst   1. Acute on chronic pancreatitis - 1. IVF 2. Pain Control 3. Bowel Rest, trial of ice chips, clear liquids for now and adv diet as tolerated 4. Follow Pancreatic enzymes, electrolytes and renal fxn 5.  2. Pancreatic pseudocyst -   GI Eval 3. AKI - 1. Likely pre-renal due to decreased PO intake 2. IVF 3. Hold lisinopril / HCTZ 4. Monitor renal fxn/electrolytes   4. UA is actually ? UTI 1. No SIRS, no dysuria though 2. Hold off on ABx for now, culture pending 3.  5. DM2 - 1. Hold home PO hypoglycemics 2. Sensitive SSI Q4H 6. HTN - 1. BPs running on low side 2. Hold home BP meds - re-introduce as needed 3.  7. CAD - 1. Continue ASA / Plavix  DVT prophylaxis: Lovenox Code Status: Full Disposition Plan: Home  Family Communication: Discussed in detail with the  patient, all imaging results, lab results explained to the patient      Time Spent in minutes 35 minutes  Procedures:    Consultants:     Antimicrobials:      Medications  Scheduled Meds: . aspirin EC  81 mg Oral Daily  . clopidogrel  75 mg Oral Daily  . enoxaparin (LOVENOX) injection  40 mg Subcutaneous Q24H  . insulin aspart  0-9 Units Subcutaneous Q4H  . mometasone-formoterol  2 puff Inhalation BID  . multivitamin with minerals  1 tablet Oral Daily  . pantoprazole  40 mg Oral Daily  . rosuvastatin  40 mg Oral Daily   Continuous Infusions: . sodium chloride 125 mL/hr at 07/15/17 1125   PRN Meds:.acetaminophen **OR** acetaminophen, albuterol, diclofenac sodium, methocarbamol, morphine injection, ondansetron **OR** ondansetron (ZOFRAN) IV, oxyCODONE-acetaminophen, phenol   Antibiotics   Anti-infectives (From admission, onward)   None        Subjective:   [name] was seen and examined today.   Patient denies  dizziness, chest pain, shortness of breath, abdominal pain and nausea better, no vomitting  No acute events overnight.    Objective:   Vitals:   07/15/17 0103 07/15/17 0126 07/15/17 0404 07/15/17 0929  BP: (!) 98/58 132/64 108/62   Pulse: 77 71 84   Resp:  16 18   Temp:  97.9 F (36.6 C) (!) 97.4 F (36.3 C)   TempSrc:  Oral Oral   SpO2: 100% 98% 100% 100%    Intake/Output Summary (Last 24 hours) at 07/15/2017 1249 Last data filed at 07/15/2017 0800 Gross per 24 hour  Intake 2041.67 ml  Output -  Net 2041.67 ml     Wt Readings from Last 3 Encounters:  07/14/17 82.6 kg (182 lb)  07/07/17 84.8 kg (187 lb)  06/30/17 87.7 kg (193 lb 4 oz)     Exam  General: NAD  HEENT: NCAT,  PERRL,MMM  Neck: SUPPLE, (-) JVD  Cardiovascular: RRR, (-) GALLOP, (-) MURMUR  Respiratory: CTA  Gastrointestinal: SOFT, (-) DISTENSION, BS(+), (+) mild epigastricTENDERNESS  Ext: (-) CYANOSIS, (-) EDEMA  Neuro: A, OX 3  Skin:(-)  RASH  Psych:NORMAL AFFECT/MOOD   Data Reviewed:  I have personally reviewed following labs and imaging studies  Micro Results No results found for this or any previous visit (from the past 240 hour(s)).  Radiology Reports Dg Chest 2 View  Result Date: 06/26/2017 CLINICAL DATA:  Pt states she has a history of pancreatitis and this feels similar with side and abdominal pain but also has a component of chest pain/sharp/pressure radiating to back. Constant. Vomiting and nausea present. Cannot tolerate po without increased pain. EXAM: CHEST - 2 VIEW COMPARISON:  Chest x-ray dated 05/13/2014. FINDINGS: The heart size and mediastinal contours are within normal limits. Aortic atherosclerosis noted. Both lungs are clear. Mild degenerative spurring within the slightly kyphotic thoracic spine. No acute or suspicious osseous finding. IMPRESSION: No active cardiopulmonary disease. No evidence of pneumonia or pulmonary edema. Electronically Signed   By: Bary Richard M.D.   On: 06/26/2017 21:36   Ct Abdomen Pelvis W Contrast  Result Date: 07/14/2017 CLINICAL DATA:  Patient recently admitted for pancreatitis. Recurrent epigastric pain. EXAM: CT ABDOMEN AND PELVIS WITH CONTRAST TECHNIQUE: Multidetector CT imaging of the abdomen and pelvis was performed using the standard protocol following bolus administration of intravenous contrast. CONTRAST:  OMNIPAQUE IOHEXOL 300 MG/ML  SOLN COMPARISON:  Jun 27, 2017 FINDINGS: Lower chest: No acute abnormality. Hepatobiliary: Hepatic steatosis. The liver, portal vein, and gallbladder are otherwise normal. Pancreas: There is a fluid collection in the head of the pancreas on series 3, image 35 measuring 18 by 10 mm, not seen in December 2014 and larger since May of 2019. This is probably sequela of the recent pancreatitis given the short interval. The fluid collection between the pancreatic neck and left hepatic lobe is higher in attenuation and smaller measuring 13 mm  today versus 18 mm previously. There are calcifications in the pancreatic head consistent with chronic pancreatitis. The pancreatic tail and most of the body are normal. There is increased attenuation in the fat of the mesentery adjacent to the pancreas. There is a developing collection on series 3, image 34 measuring 4.3 x 2.4 cm. The attenuation is mixed but probably contains at least some thick fluid. There is a high attenuation focal region in the anterior mesentery on series 3, image 36 measuring 3.1 x 4.3 cm with an attenuation of 25 Hounsfield units. The fluid adjacent to the duodenum and  pancreatic head on the previous study has resolved. The patient's lipase is normal today. Spleen: Normal in size without focal abnormality. Adrenals/Urinary Tract: Adrenal glands are normal. A lesion off the upper pole the right kidney on series 3, image 28 measures 13 mm today and 12 mm on the previous study, unchanged given difference in slice selection. The attenuation is 46 Hounsfield units. Several tiny cysts are seen in both kidneys, some too small to characterize. At least 1 appears hyperdense on the left. No hydronephrosis. No ureterectasis or ureteral stones. The bladder is normal. Stomach/Bowel: The stomach is normal. There may be some mild persistent inflammation of the distal second portion of the duodenum. The small bowel is otherwise normal. The colon is normal. Previous appendectomy. Vascular/Lymphatic: Atherosclerotic changes are seen in the nonaneurysmal aorta. No adenopathy. Reproductive: Uterus and bilateral adnexa are unremarkable. Other: No abdominal wall hernia or abnormality. No abdominopelvic ascites. Musculoskeletal: No acute or significant osseous findings. IMPRESSION: 1. The developing masslike regions in the mesentery anterior and inferior to the pancreas are likely sequela of recent pancreatitis. They demonstrate mixed attenuation and I suspect they represent developing thick fluid collections.  There is some mild surrounding stranding remaining. 2. There is a 18 x 10 mm cyst in the pancreatic head which is larger in the interval, likely sequela of recent pancreatitis. 3. A 13 mm mass off the upper pole the right kidney is likely a hyperdense cyst but nonspecific. Recommend correlation with ultrasound for confirmation of its cystic nature. 4. Probable mild persistent inflammation of the distal second portion of the duodenum from recent pancreatitis. 5. Atherosclerosis in the aorta. Electronically Signed   By: Gerome Samavid  Williams III M.D   On: 07/14/2017 20:05   Ct Abdomen Pelvis W Contrast  Result Date: 06/27/2017 CLINICAL DATA:  62 y/o F; abdominal pain with history of pancreatitis. EXAM: CT ABDOMEN AND PELVIS WITH CONTRAST TECHNIQUE: Multidetector CT imaging of the abdomen and pelvis was performed using the standard protocol following bolus administration of intravenous contrast. CONTRAST:  80mL OMNIPAQUE IOHEXOL 300 MG/ML  SOLN COMPARISON:  01/28/2013 CT abdomen and pelvis. FINDINGS: Lower chest: Severe coronary artery calcifications. Hepatobiliary: No focal liver abnormality is seen. No gallstones, gallbladder wall thickening, or biliary dilatation. Pancreas: Extensive edema surrounding the pancreatic head, first and second segments of the duodenum, and within the mesentery compatible with acute pancreatitis. Multilobulated cyst between the left lobe of the liver and the pancreas measuring up to 19 mm. Calcification within the pancreas compatible sequelae of chronic pancreatitis. Spleen: Normal in size without focal abnormality. Adrenals/Urinary Tract: Normal adrenal glands. Multiple cysts in the kidneys measuring up to 12 mm in the right upper pole. No hydronephrosis. Normal bladder. Stomach/Bowel: Stomach is within normal limits. Appendix appears normal. No evidence of bowel wall thickening, distention, or inflammatory changes. Mild sigmoid diverticulosis, no findings of acute diverticulitis.  Vascular/Lymphatic: Aortic atherosclerosis. No enlarged abdominal or pelvic lymph nodes. Reproductive: Uterus and bilateral adnexa are unremarkable. Other: No abdominal wall hernia or abnormality. No abdominopelvic ascites. Musculoskeletal: No fracture is seen. IMPRESSION: 1. Acute on chronic pancreatitis. No findings of necrosis. 18 mm peripancreatic collection between the liver and head of pancreas. 2. Mild sigmoid diverticulosis, no findings of acute diverticulitis. 3. Aortic atherosclerosis. Electronically Signed   By: Mitzi HansenLance  Furusawa-Stratton M.D.   On: 06/27/2017 05:16   [place] Abdomen Limited Ruq  Result Date: 06/27/2017 CLINICAL DATA:  62 y/o  F; epigastric abdominal pain. EXAM: ULTRASOUND ABDOMEN LIMITED RIGHT UPPER QUADRANT COMPARISON:  03/26/2009 CT  abdomen and pelvis. FINDINGS: Gallbladder: No gallstones or wall thickening visualized. No sonographic Murphy sign noted by sonographer. Common bile duct: Diameter: 5.9 mm Liver: No focal lesion identified. Within normal limits in parenchymal echogenicity. Portal vein is patent on color Doppler imaging with normal direction of blood flow towards the liver. IMPRESSION: No acute process identified. Unremarkable right upper quadrant ultrasound. Electronically Signed   By: Mitzi Hansen M.D.   On: 06/27/2017 04:38    Lab Data:  CBC: Recent Labs  Lab 07/14/17 1116 07/15/17 0813  WBC 6.9 5.0  HGB 11.6* 10.5*  HCT 37.8 34.3*  MCV 88.5 91.0  PLT 440* 349   Basic Metabolic Panel: Recent Labs  Lab 07/14/17 1116 07/15/17 0813  NA 137 141  K 4.4 4.0  CL 98* 106  CO2 26 25  GLUCOSE 140* 95  BUN 29* 18  CREATININE 1.64* 1.17*  CALCIUM 10.1 9.2   GFR: Estimated Creatinine Clearance: 51.5 mL/min (A) (by C-G formula based on SCr of 1.17 mg/dL (H)). Liver Function Tests: Recent Labs  Lab 07/14/17 1116 07/15/17 0813  AST 21 20  ALT 16 16  ALKPHOS 70 57  BILITOT 0.7 0.5  PROT 7.8 6.1*  ALBUMIN 3.8 3.2*   Recent Labs  Lab  07/14/17 1116  LIPASE 43   No results for input(s): AMMONIA in the last 168 hours. Coagulation Profile: No results for input(s): INR, PROTIME in the last 168 hours. Cardiac Enzymes: No results for input(s): CKTOTAL, CKMB, CKMBINDEX, TROPONINI in the last 168 hours. BNP (last 3 results) No results for input(s): PROBNP in the last 8760 hours. HbA1C: No results for input(s): HGBA1C in the last 72 hours. CBG: Recent Labs  Lab 07/15/17 0046 07/15/17 0403 07/15/17 0449 07/15/17 0821 07/15/17 1215  GLUCAP 104* 51* 164* 82 79   Lipid Profile: No results for input(s): CHOL, HDL, LDLCALC, TRIG, CHOLHDL, LDLDIRECT in the last 72 hours. Thyroid Function Tests: No results for input(s): TSH, T4TOTAL, FREET4, T3FREE, THYROIDAB in the last 72 hours. Anemia Panel: No results for input(s): VITAMINB12, FOLATE, FERRITIN, TIBC, IRON, RETICCTPCT in the last 72 hours. Urine analysis:    Component Value Date/Time   COLORURINE AMBER (A) 07/14/2017 1100   APPEARANCEUR CLOUDY (A) 07/14/2017 1100   LABSPEC 1.025 07/14/2017 1100   PHURINE 5.0 07/14/2017 1100   GLUCOSEU NEGATIVE 07/14/2017 1100   HGBUR SMALL (A) 07/14/2017 1100   BILIRUBINUR MODERATE (A) 07/14/2017 1100   KETONESUR 5 (A) 07/14/2017 1100   PROTEINUR 100 (A) 07/14/2017 1100   UROBILINOGEN 0.2 05/13/2014 1250   NITRITE NEGATIVE 07/14/2017 1100   LEUKOCYTESUR MODERATE (A) 07/14/2017 1100     Jackie Plum M.D. [facility] 07/15/2017, 12:49 PM  Pager: [phone] Between 7am to 7pm - call Pager - [phone]  After 7pm go to [email or web address] - password [email or web address]  Call night coverage person covering after 7pm

## 2017-07-16 ENCOUNTER — Other Ambulatory Visit: Payer: Self-pay | Admitting: Family Medicine

## 2017-07-16 LAB — COMPREHENSIVE METABOLIC PANEL
ALT: 16 U/L (ref 14–54)
ANION GAP: 9 (ref 5–15)
AST: 20 U/L (ref 15–41)
Albumin: 3.1 g/dL — ABNORMAL LOW (ref 3.5–5.0)
Alkaline Phosphatase: 52 U/L (ref 38–126)
BUN: 10 mg/dL (ref 6–20)
CHLORIDE: 107 mmol/L (ref 101–111)
CO2: 24 mmol/L (ref 22–32)
Calcium: 8.9 mg/dL (ref 8.9–10.3)
Creatinine, Ser: 1.09 mg/dL — ABNORMAL HIGH (ref 0.44–1.00)
GFR calc non Af Amer: 54 mL/min — ABNORMAL LOW (ref >60)
Glucose, Bld: 139 mg/dL — ABNORMAL HIGH (ref 65–99)
POTASSIUM: 4.2 mmol/L (ref 3.5–5.1)
SODIUM: 140 mmol/L (ref 135–145)
Total Bilirubin: 0.2 mg/dL — ABNORMAL LOW (ref 0.3–1.2)
Total Protein: 5.8 g/dL — ABNORMAL LOW (ref 6.5–8.1)

## 2017-07-16 LAB — CBC
HCT: 32.7 % — ABNORMAL LOW (ref 36.0–46.0)
Hemoglobin: 9.9 g/dL — ABNORMAL LOW (ref 12.0–15.0)
MCH: 27.4 pg (ref 26.0–34.0)
MCHC: 30.3 g/dL (ref 30.0–36.0)
MCV: 90.6 fL (ref 78.0–100.0)
PLATELETS: 351 10*3/uL (ref 150–400)
RBC: 3.61 MIL/uL — ABNORMAL LOW (ref 3.87–5.11)
RDW: 14.4 % (ref 11.5–15.5)
WBC: 5.5 10*3/uL (ref 4.0–10.5)

## 2017-07-16 LAB — LIPID PANEL
CHOL/HDL RATIO: 2.9 ratio
CHOLESTEROL: 101 mg/dL (ref 0–200)
HDL: 35 mg/dL — ABNORMAL LOW (ref >40)
LDL Cholesterol: 31 mg/dL (ref 0–99)
TRIGLYCERIDES: 177 mg/dL — AB (ref <150)
VLDL: 35 mg/dL (ref 0–40)

## 2017-07-16 LAB — URINE CULTURE

## 2017-07-16 LAB — GLUCOSE, CAPILLARY
GLUCOSE-CAPILLARY: 142 mg/dL — AB (ref 65–99)
GLUCOSE-CAPILLARY: 157 mg/dL — AB (ref 65–99)
GLUCOSE-CAPILLARY: 160 mg/dL — AB (ref 65–99)
GLUCOSE-CAPILLARY: 152 mg/dL — AB (ref 65–99)
Glucose-Capillary: 113 mg/dL — ABNORMAL HIGH (ref 65–99)
Glucose-Capillary: 62 mg/dL — ABNORMAL LOW (ref 65–99)
Glucose-Capillary: 78 mg/dL (ref 65–99)

## 2017-07-16 LAB — AMYLASE: Amylase: 85 U/L (ref 28–100)

## 2017-07-16 NOTE — Progress Notes (Signed)
[facility]                                                                              Patient Demographics  [name], is a 62 y.o. female, DOB - 1955/02/21, WUJ:[id number]  Admit date - 07/14/2017   Admitting Physician Hillary Bow, DO  Outpatient Primary MD for the patient is Pickard, Priscille Heidelberg, MD  Outpatient specialists:   LOS - 1  days    No chief complaint on file.      Brief summary   [name] is a 62 y.o. female with medical history significant for but not limited to DM2, CAD, recent acute pancreatitis treated from May 13-17 presenting with worsening epigastric pain radiating to back and LUQ, associated with nausea without vomitting and decreased oral intake.  ED Eval revealed Lipase 40, Creat 1.6 up from baseline 1.0.  CT shows development of pseudocyst and fluid collections around pancreas.        Assessment & Plan    Principal Problem:   Acute on chronic pancreatitis (HCC) Active Problems:   Diabetes mellitus, type II (HCC)   Essential hypertension   CAD (coronary artery disease)   Acute pancreatitis   AKI (acute kidney injury) (HCC)   Pancreatic pseudocyst   1. Acute on chronic pancreatitis - 1. IVF 2. Pain Control 3. Bowel Rest, trial of ice chips, clear liquids for now and adv diet as tolerated 4. Follow Pancreatic enzymes, electrolytes and renal fxn 5. PT WAS DOING WELL YESTERDAY AND TODAY SO WE HAD PLANNED D/C THIS EVENING WITH OP GI F/U, BUT DID NOT TOLERATE ADV DIET FROM CLEARS SO D/C WAS HELD   2. Pancreatic pseudocyst -Had planned op f/u since she was doing so well, but did not tolerate adv diet - consider GI consult in AM    3. AKI - 1. Likely pre-renal due to decreased PO intake 2. IVF 3. Hold lisinopril / HCTZ 4. Monitor renal fxn/electrolytes   4. UA is actually ? UTI 1. No SIRS, no dysuria though 2. Hold off on ABx for now, culture pending 3.  5. DM2 - 1. Hold home PO  hypoglycemics 2. Sensitive SSI Q4H 6. HTN - 1. BPs running on low side 2. Hold home BP meds - re-introduce as needed 3.  7. CAD - 1. Continue ASA / Plavix  DVT prophylaxis: Lovenox Code Status: Full Disposition Plan: Home  Family Communication: Discussed in detail with the patient, all imaging results, lab results explained to the patient      Time Spent in minutes 25 minutes  Procedures:    Consultants:     Antimicrobials:      Medications  Scheduled Meds: . aspirin EC  81 mg Oral Daily  . clopidogrel  75 mg Oral Daily  . enoxaparin (LOVENOX) injection  40 mg Subcutaneous Q24H  . insulin aspart  0-9 Units Subcutaneous Q4H  . mometasone-formoterol  2 puff Inhalation BID  . multivitamin with minerals  1 tablet Oral Daily  . pantoprazole  40 mg Oral Daily  . rosuvastatin  40 mg Oral Daily   Continuous Infusions: . sodium chloride 125 mL/hr  at 07/16/17 0313   PRN Meds:.acetaminophen **OR** acetaminophen, albuterol, diclofenac sodium, methocarbamol, morphine injection, ondansetron **OR** ondansetron (ZOFRAN) IV, oxyCODONE-acetaminophen, phenol   Antibiotics   Anti-infectives (From admission, onward)   None        Subjective:   [name] was seen and examined today.Mild hypoglycemic episode today.   Patient denies dizziness, chest pain,  abdominal pain, no nausea or vomitting.  No acute events overnight.    Objective:   Vitals:   07/15/17 1418 07/15/17 1957 07/16/17 0416 07/16/17 0840  BP: 102/64 111/69 117/63   Pulse: 77 81 77   Resp: 19 15 16    Temp:  98.8 F (37.1 C) 98.4 F (36.9 C)   TempSrc:  Oral Oral   SpO2: 100% 98% 99% 97%    Intake/Output Summary (Last 24 hours) at 07/16/2017 1041 Last data filed at 07/16/2017 0900 Gross per 24 hour  Intake 2156 ml  Output -  Net 2156 ml     Wt Readings from Last 3 Encounters:  07/14/17 82.6 kg (182 lb)  07/07/17 84.8 kg (187 lb)  06/30/17 87.7 kg (193 lb 4 oz)     Exam  General:  NAD  HEENT: NCAT,  PERRL,MMM  Neck: SUPPLE, (-) JVD  Cardiovascular: RRR, (-) GALLOP, (-) MURMUR  Respiratory: CTA  Gastrointestinal: SOFT, (-) DISTENSION, BS(+), (+) minimal epigastricTENDERNESS  Ext: (-) CYANOSIS, (-) EDEMA  Neuro: A, OX 3  Skin:(-) RASH  Psych:NORMAL AFFECT/MOOD   Data Reviewed:  I have personally reviewed following labs and imaging studies  Micro Results Recent Results (from the past 240 hour(s))  Urine Culture     Status: Abnormal   Collection Time: 07/14/17 11:21 AM  Result Value Ref Range Status   Specimen Description URINE, CLEAN CATCH  Final   Special Requests   Final    NONE Performed at [facility], [address] [place], [place] [address]    Culture >=100,000 COLONIES/mL KLEBSIELLA PNEUMONIAE (A)  Final   Report Status 07/16/2017 FINAL  Final   Organism ID, Bacteria KLEBSIELLA PNEUMONIAE (A)  Final      Susceptibility   Klebsiella pneumoniae - MIC*    AMPICILLIN >=32 RESISTANT Resistant     CEFAZOLIN <=4 SENSITIVE Sensitive     CEFTRIAXONE <=1 SENSITIVE Sensitive     CIPROFLOXACIN <=0.25 SENSITIVE Sensitive     GENTAMICIN <=1 SENSITIVE Sensitive     IMIPENEM <=0.25 SENSITIVE Sensitive     NITROFURANTOIN 64 INTERMEDIATE Intermediate     TRIMETH/SULFA <=20 SENSITIVE Sensitive     AMPICILLIN/SULBACTAM 4 SENSITIVE Sensitive     PIP/TAZO <=4 SENSITIVE Sensitive     Extended ESBL NEGATIVE Sensitive     * >=100,000 COLONIES/mL KLEBSIELLA PNEUMONIAE    Radiology Reports Dg Chest 2 View  Result Date: 06/26/2017 CLINICAL DATA:  Pt states she has a history of pancreatitis and this feels similar with side and abdominal pain but also has a component of chest pain/sharp/pressure radiating to back. Constant. Vomiting and nausea present. Cannot tolerate po without increased pain. EXAM: CHEST - 2 VIEW COMPARISON:  Chest x-ray dated 05/13/2014. FINDINGS: The heart size and mediastinal contours are within normal limits. Aortic atherosclerosis  noted. Both lungs are clear. Mild degenerative spurring within the slightly kyphotic thoracic spine. No acute or suspicious osseous finding. IMPRESSION: No active cardiopulmonary disease. No evidence of pneumonia or pulmonary edema. Electronically Signed   By: Bary RichardStan  Maynard M.D.   On: 06/26/2017 21:36   Ct Abdomen Pelvis W Contrast  Result  Date: 07/14/2017 CLINICAL DATA:  Patient recently admitted for pancreatitis. Recurrent epigastric pain. EXAM: CT ABDOMEN AND PELVIS WITH CONTRAST TECHNIQUE: Multidetector CT imaging of the abdomen and pelvis was performed using the standard protocol following bolus administration of intravenous contrast. CONTRAST:  OMNIPAQUE IOHEXOL 300 MG/ML  SOLN COMPARISON:  Jun 27, 2017 FINDINGS: Lower chest: No acute abnormality. Hepatobiliary: Hepatic steatosis. The liver, portal vein, and gallbladder are otherwise normal. Pancreas: There is a fluid collection in the head of the pancreas on series 3, image 35 measuring 18 by 10 mm, not seen in December 2014 and larger since May of 2019. This is probably sequela of the recent pancreatitis given the short interval. The fluid collection between the pancreatic neck and left hepatic lobe is higher in attenuation and smaller measuring 13 mm today versus 18 mm previously. There are calcifications in the pancreatic head consistent with chronic pancreatitis. The pancreatic tail and most of the body are normal. There is increased attenuation in the fat of the mesentery adjacent to the pancreas. There is a developing collection on series 3, image 34 measuring 4.3 x 2.4 cm. The attenuation is mixed but probably contains at least some thick fluid. There is a high attenuation focal region in the anterior mesentery on series 3, image 36 measuring 3.1 x 4.3 cm with an attenuation of 25 Hounsfield units. The fluid adjacent to the duodenum and pancreatic head on the previous study has resolved. The patient's lipase is normal today. Spleen: Normal in  size without focal abnormality. Adrenals/Urinary Tract: Adrenal glands are normal. A lesion off the upper pole the right kidney on series 3, image 28 measures 13 mm today and 12 mm on the previous study, unchanged given difference in slice selection. The attenuation is 46 Hounsfield units. Several tiny cysts are seen in both kidneys, some too small to characterize. At least 1 appears hyperdense on the left. No hydronephrosis. No ureterectasis or ureteral stones. The bladder is normal. Stomach/Bowel: The stomach is normal. There may be some mild persistent inflammation of the distal second portion of the duodenum. The small bowel is otherwise normal. The colon is normal. Previous appendectomy. Vascular/Lymphatic: Atherosclerotic changes are seen in the nonaneurysmal aorta. No adenopathy. Reproductive: Uterus and bilateral adnexa are unremarkable. Other: No abdominal wall hernia or abnormality. No abdominopelvic ascites. Musculoskeletal: No acute or significant osseous findings. IMPRESSION: 1. The developing masslike regions in the mesentery anterior and inferior to the pancreas are likely sequela of recent pancreatitis. They demonstrate mixed attenuation and I suspect they represent developing thick fluid collections. There is some mild surrounding stranding remaining. 2. There is a 18 x 10 mm cyst in the pancreatic head which is larger in the interval, likely sequela of recent pancreatitis. 3. A 13 mm mass off the upper pole the right kidney is likely a hyperdense cyst but nonspecific. Recommend correlation with ultrasound for confirmation of its cystic nature. 4. Probable mild persistent inflammation of the distal second portion of the duodenum from recent pancreatitis. 5. Atherosclerosis in the aorta. Electronically Signed   By: Gerome Sam III M.D   On: 07/14/2017 20:05   Ct Abdomen Pelvis W Contrast  Result Date: 06/27/2017 CLINICAL DATA:  62 y/o F; abdominal pain with history of pancreatitis. EXAM: CT  ABDOMEN AND PELVIS WITH CONTRAST TECHNIQUE: Multidetector CT imaging of the abdomen and pelvis was performed using the standard protocol following bolus administration of intravenous contrast. CONTRAST:  80mL OMNIPAQUE IOHEXOL 300 MG/ML  SOLN COMPARISON:  01/28/2013 CT abdomen  and pelvis. FINDINGS: Lower chest: Severe coronary artery calcifications. Hepatobiliary: No focal liver abnormality is seen. No gallstones, gallbladder wall thickening, or biliary dilatation. Pancreas: Extensive edema surrounding the pancreatic head, first and second segments of the duodenum, and within the mesentery compatible with acute pancreatitis. Multilobulated cyst between the left lobe of the liver and the pancreas measuring up to 19 mm. Calcification within the pancreas compatible sequelae of chronic pancreatitis. Spleen: Normal in size without focal abnormality. Adrenals/Urinary Tract: Normal adrenal glands. Multiple cysts in the kidneys measuring up to 12 mm in the right upper pole. No hydronephrosis. Normal bladder. Stomach/Bowel: Stomach is within normal limits. Appendix appears normal. No evidence of bowel wall thickening, distention, or inflammatory changes. Mild sigmoid diverticulosis, no findings of acute diverticulitis. Vascular/Lymphatic: Aortic atherosclerosis. No enlarged abdominal or pelvic lymph nodes. Reproductive: Uterus and bilateral adnexa are unremarkable. Other: No abdominal wall hernia or abnormality. No abdominopelvic ascites. Musculoskeletal: No fracture is seen. IMPRESSION: 1. Acute on chronic pancreatitis. No findings of necrosis. 18 mm peripancreatic collection between the liver and head of pancreas. 2. Mild sigmoid diverticulosis, no findings of acute diverticulitis. 3. Aortic atherosclerosis. Electronically Signed   By: Mitzi Hansen M.D.   On: 06/27/2017 05:16   US Abdomen Limited Ruq  Result Date: 06/27/2017 CLINICAL DATA:  62 y/o  F; epigastric abdominal pain. EXAM: ULTRASOUND ABDOMEN  LIMITED RIGHT UPPER QUADRANT COMPARISON:  03/26/2009 CT abdomen and pelvis. FINDINGS: Gallbladder: No gallstones or wall thickening visualized. No sonographic Murphy sign noted by sonographer. Common bile duct: Diameter: 5.9 mm Liver: No focal lesion identified. Within normal limits in parenchymal echogenicity. Portal vein is patent on color Doppler imaging with normal direction of blood flow towards the liver. IMPRESSION: No acute process identified. Unremarkable right upper quadrant ultrasound. Electronically Signed   By: Mitzi Hansen M.D.   On: 06/27/2017 04:38    Lab Data:  CBC: Recent Labs  Lab 07/14/17 1116 07/15/17 0813 07/16/17 0345  WBC 6.9 5.0 5.5  HGB 11.6* 10.5* 9.9*  HCT 37.8 34.3* 32.7*  MCV 88.5 91.0 90.6  PLT 440* 349 351   Basic Metabolic Panel: Recent Labs  Lab 07/14/17 1116 07/15/17 0813 07/16/17 0345  NA 137 141 140  K 4.4 4.0 4.2  CL 98* 106 107  CO2 26 25 24   GLUCOSE 140* 95 139*  BUN 29* 18 10  CREATININE 1.64* 1.17* 1.09*  CALCIUM 10.1 9.2 8.9   GFR: Estimated Creatinine Clearance: 55.3 mL/min (A) (by C-G formula based on SCr of 1.09 mg/dL (H)). Liver Function Tests: Recent Labs  Lab 07/14/17 1116 07/15/17 0813 07/16/17 0345  AST 21 20 20   ALT 16 16 16   ALKPHOS 70 57 52  BILITOT 0.7 0.5 0.2*  PROT 7.8 6.1* 5.8*  ALBUMIN 3.8 3.2* 3.1*   Recent Labs  Lab 07/14/17 1116 07/16/17 0345  LIPASE 43  --   AMYLASE  --  85   No results for input(s): AMMONIA in the last 168 hours. Coagulation Profile: No results for input(s): INR, PROTIME in the last 168 hours. Cardiac Enzymes: No results for input(s): CKTOTAL, CKMB, CKMBINDEX, TROPONINI in the last 168 hours. BNP (last 3 results) No results for input(s): PROBNP in the last 8760 hours. HbA1C: No results for input(s): HGBA1C in the last 72 hours. CBG: Recent Labs  Lab 07/15/17 1632 07/15/17 1956 07/15/17 2346 07/16/17 0026 07/16/17 0321  GLUCAP 125* 131* 62* 78 142*    Lipid Profile: Recent Labs    07/16/17 0345  CHOL 101  HDL 35*  LDLCALC 31  TRIG 098*  CHOLHDL 2.9   Thyroid Function Tests: No results for input(s): TSH, T4TOTAL, FREET4, T3FREE, THYROIDAB in the last 72 hours. Anemia Panel: No results for input(s): VITAMINB12, FOLATE, FERRITIN, TIBC, IRON, RETICCTPCT in the last 72 hours. Urine analysis:    Component Value Date/Time   COLORURINE AMBER (A) 07/14/2017 1100   APPEARANCEUR CLOUDY (A) 07/14/2017 1100   LABSPEC 1.025 07/14/2017 1100   PHURINE 5.0 07/14/2017 1100   GLUCOSEU NEGATIVE 07/14/2017 1100   HGBUR SMALL (A) 07/14/2017 1100   BILIRUBINUR MODERATE (A) 07/14/2017 1100   KETONESUR 5 (A) 07/14/2017 1100   PROTEINUR 100 (A) 07/14/2017 1100   UROBILINOGEN 0.2 05/13/2014 1250   NITRITE NEGATIVE 07/14/2017 1100   LEUKOCYTESUR MODERATE (A) 07/14/2017 1100     Jackie Plum M.D. [facility] 07/16/2017, 10:41 AM  Pager: [phone] Between 7am to 7pm - call Pager - [phone]  After 7pm go to [email or web address] - password [email or web address]  Call night coverage person covering after 7pm

## 2017-07-16 NOTE — Progress Notes (Signed)
Hypoglycemic Event  CBG: 62  Treatment: Apple juice and sugar  Symptoms: asymptomatic  Follow-up CBG: Time: 0024 CBG Result: 78  Possible Reasons for Event: given 1 unit of insulin at 2000  Comments/MD notified:  Pt given sprite   [facility]  Gaylynn Seiple

## 2017-07-17 ENCOUNTER — Encounter (HOSPITAL_COMMUNITY): Payer: Self-pay | Admitting: Physician Assistant

## 2017-07-17 ENCOUNTER — Other Ambulatory Visit: Payer: Self-pay | Admitting: Family Medicine

## 2017-07-17 DIAGNOSIS — K861 Other chronic pancreatitis: Secondary | ICD-10-CM

## 2017-07-17 DIAGNOSIS — R1010 Upper abdominal pain, unspecified: Secondary | ICD-10-CM

## 2017-07-17 DIAGNOSIS — K859 Acute pancreatitis without necrosis or infection, unspecified: Principal | ICD-10-CM

## 2017-07-17 DIAGNOSIS — K863 Pseudocyst of pancreas: Secondary | ICD-10-CM

## 2017-07-17 DIAGNOSIS — R630 Anorexia: Secondary | ICD-10-CM

## 2017-07-17 LAB — GLUCOSE, CAPILLARY
GLUCOSE-CAPILLARY: 115 mg/dL — AB (ref 65–99)
GLUCOSE-CAPILLARY: 98 mg/dL (ref 65–99)
Glucose-Capillary: 105 mg/dL — ABNORMAL HIGH (ref 65–99)
Glucose-Capillary: 126 mg/dL — ABNORMAL HIGH (ref 65–99)
Glucose-Capillary: 63 mg/dL — ABNORMAL LOW (ref 65–99)
Glucose-Capillary: 81 mg/dL (ref 65–99)
Glucose-Capillary: 84 mg/dL (ref 65–99)

## 2017-07-17 MED ORDER — BISOPROLOL FUMARATE 5 MG PO TABS
2.5000 mg | ORAL_TABLET | Freq: Every day | ORAL | Status: DC
  Administered 2017-07-18 – 2017-07-19 (×2): 2.5 mg via ORAL
  Filled 2017-07-17 (×2): qty 0.5

## 2017-07-17 MED ORDER — PANCRELIPASE (LIP-PROT-AMYL) 36000-114000 UNITS PO CPEP
72000.0000 [IU] | ORAL_CAPSULE | Freq: Three times a day (TID) | ORAL | Status: DC
  Administered 2017-07-18 – 2017-07-19 (×5): 72000 [IU] via ORAL
  Filled 2017-07-17 (×6): qty 2

## 2017-07-17 NOTE — Progress Notes (Signed)
Hypoglycemic Event  CBG: 63  Treatment: Patient given two sprites on clear liquid diet   Symptoms: asymptomatic  Follow-up CBG: Time: 0043 CBG Result: 84  Possible Reasons for Event: given 2 units of insulin at 2000 (corrective dose)  Comments/MD notified: Will continue to monitor     [facility]  Javonda Suh

## 2017-07-17 NOTE — Telephone Encounter (Signed)
Pt needs refill on pain med to [facility].

## 2017-07-17 NOTE — Progress Notes (Signed)
PROGRESS NOTE  [name] ZOX:[id number] DOB: 1955/12/20 DOA: 07/14/2017 PCP: Donita Brooks, MD  HPI/Recap of past 24 hours:  Not able to tolerate clears,  She required pain meds after trying clears this am  No vomiting, no fever  Assessment/Plan: Principal Problem:   Acute on chronic pancreatitis (HCC) Active Problems:   Diabetes mellitus, type II (HCC)   Essential hypertension   CAD (coronary artery disease)   Acute pancreatitis   AKI (acute kidney injury) (HCC)   Pancreatic pseudocyst   Recurrent pancreatitis -I have reviewed her past records, she has been having signs of pancreatitis on CT ab as early as from 2000 -she was recently hospitalized for the same from 5/13 and 5/17 -triglyceride 177, she does not drink , does not smoke currently, no h/o gallstone -CT ab on presentation: "1. The developing masslike regions in the mesentery anterior and inferior to the pancreas are likely sequela of recent pancreatitis. They demonstrate mixed attenuation and I suspect they represent developing thick fluid collections. There is some mild surrounding stranding remaining. 2. There is a 18 x 10 mm cyst in the pancreatic head which is larger in the interval, likely sequela of recent pancreatitis. 3. A 13 mm mass off the upper pole the right kidney is likely a hyperdense cyst but nonspecific. Recommend correlation with ultrasound for confirmation of its cystic nature. 4. Probable mild persistent inflammation of the distal second portion of the duodenum from recent pancreatitis. -she still cannot tolerate diet, she has never had GI eval in the past, I have discussed case with GI, GI will see patient in consult.   AKI on CKDIII -cr 1.6 on presentation, cr 1.09 today ( baseline appear to be around 0.95) -renal dosing meds  Noninsulin dependent dm2 Recent a1c 6.7 Home oral meds held On ssi She does not eat much due to pancreatitis  On hypoglycemia protocal   CAD, last cath  in 2011, no intervention, medical management (asa, plavix,statin) Denies chest pain  PAD s/p angioplasty and stent to her right leg, on asa/plavix/statin  HTN;  -she presented with hypotension, likely due to dehydration due to not eating -Home bp meds bisoprolol-hctz, imdur, lisinopril, held on admission  -bp improving, resume bisoprolol  Anemia of chronic disease Monitor cbc  Asymptomatic bacteuria -urine culture + for klebsiella pneumonia from three days ago -she does not appear symptomatic -no abx   H/o Asthma with COPD No wheezing, no hypoxia Continue home meds     Code Status: full  Family Communication: patient   Disposition Plan: home in 1-2 days pending symptom control and gi clearance   Consultants:  LBGI  Procedures:  none  Antibiotics:  none   Objective: BP (!) 122/105 (BP Location: Right Arm)   Pulse 73   Temp 97.9 F (36.6 C) (Oral)   Resp 16   SpO2 92%   Intake/Output Summary (Last 24 hours) at 07/17/2017 1434 Last data filed at 07/17/2017 0900 Gross per 24 hour  Intake 600 ml  Output -  Net 600 ml   There were no vitals filed for this visit.  Exam: Patient is examined daily including today on 07/17/2017, exams remain the same as of yesterday except that has changed    General:  NAD  Cardiovascular: RRR  Respiratory: CTABL  Abdomen: Soft/ND/NT, positive BS  Musculoskeletal: No Edema  Neuro: alert, oriented   Data Reviewed: Basic Metabolic Panel: Recent Labs  Lab 07/14/17 1116 07/15/17 0813 07/16/17 0345  NA 137 141 140  K 4.4 4.0 4.2  CL 98* 106 107  CO2 26 25 24   GLUCOSE 140* 95 139*  BUN 29* 18 10  CREATININE 1.64* 1.17* 1.09*  CALCIUM 10.1 9.2 8.9   Liver Function Tests: Recent Labs  Lab 07/14/17 1116 07/15/17 0813 07/16/17 0345  AST 21 20 20   ALT 16 16 16   ALKPHOS 70 57 52  BILITOT 0.7 0.5 0.2*  PROT 7.8 6.1* 5.8*  ALBUMIN 3.8 3.2* 3.1*   Recent Labs  Lab 07/14/17 1116 07/16/17 0345  LIPASE 43   --   AMYLASE  --  85   No results for input(s): AMMONIA in the last 168 hours. CBC: Recent Labs  Lab 07/14/17 1116 07/15/17 0813 07/16/17 0345  WBC 6.9 5.0 5.5  HGB 11.6* 10.5* 9.9*  HCT 37.8 34.3* 32.7*  MCV 88.5 91.0 90.6  PLT 440* 349 351   Cardiac Enzymes:   No results for input(s): CKTOTAL, CKMB, CKMBINDEX, TROPONINI in the last 168 hours. BNP (last 3 results) No results for input(s): BNP in the last 8760 hours.  ProBNP (last 3 results) No results for input(s): PROBNP in the last 8760 hours.  CBG: Recent Labs  Lab 07/17/17 0007 07/17/17 0043 07/17/17 0303 07/17/17 0414 07/17/17 1154  GLUCAP 63* 84 126* 115* 105*    Recent Results (from the past 240 hour(s))  Urine Culture     Status: Abnormal   Collection Time: 07/14/17 11:21 AM  Result Value Ref Range Status   Specimen Description URINE, CLEAN CATCH  Final   Special Requests   Final    NONE Performed at [facility], [address] [place], [place] [address]    Culture >=100,000 COLONIES/mL KLEBSIELLA PNEUMONIAE (A)  Final   Report Status 07/16/2017 FINAL  Final   Organism ID, Bacteria KLEBSIELLA PNEUMONIAE (A)  Final      Susceptibility   Klebsiella pneumoniae - MIC*    AMPICILLIN >=32 RESISTANT Resistant     CEFAZOLIN <=4 SENSITIVE Sensitive     CEFTRIAXONE <=1 SENSITIVE Sensitive     CIPROFLOXACIN <=0.25 SENSITIVE Sensitive     GENTAMICIN <=1 SENSITIVE Sensitive     IMIPENEM <=0.25 SENSITIVE Sensitive     NITROFURANTOIN 64 INTERMEDIATE Intermediate     TRIMETH/SULFA <=20 SENSITIVE Sensitive     AMPICILLIN/SULBACTAM 4 SENSITIVE Sensitive     PIP/TAZO <=4 SENSITIVE Sensitive     Extended ESBL NEGATIVE Sensitive     * >=100,000 COLONIES/mL KLEBSIELLA PNEUMONIAE     Studies: No results found.  Scheduled Meds: . aspirin EC  81 mg Oral Daily  . clopidogrel  75 mg Oral Daily  . enoxaparin (LOVENOX) injection  40 mg Subcutaneous Q24H  . insulin aspart  0-9 Units Subcutaneous Q4H  .  mometasone-formoterol  2 puff Inhalation BID  . multivitamin with minerals  1 tablet Oral Daily  . pantoprazole  40 mg Oral Daily  . rosuvastatin  40 mg Oral Daily    Continuous Infusions: . sodium chloride 125 mL/hr at 07/17/17 [id number]     Time spent: 35mins, case discussed with LBGI I have personally reviewed and interpreted on  07/17/2017 daily labs, tele strips, imagings as discussed above under date review session and assessment and plans.  I reviewed all nursing notes,  vitals, pertinent old records  I have discussed plan of care as described above with RN , patient on 07/17/2017   Albertine GratesFang Moua Rasmusson MD, PhD  [facility] Pager [phone]. If 7PM-7AM, please contact night-coverage at [email or web address], password [email or web address]  07/17/2017, 2:34 PM  LOS: 2 days

## 2017-07-17 NOTE — Consult Note (Addendum)
[facility] Consult: 3:36 PM 07/17/2017  LOS: 2 days    Referring Provider: Dr. Erlinda Hong Primary Care Physician:  Susy Frizzle, MD Primary Gastroenterologist:  unassigned   Reason for Consultation: Acute on chronic pancreatitis with chronic abdominal pain.   HPI: [name] is a 62 y.o. female.  PMH Recurrent pancreatitis.  Hypertension.  Type 2 DM.  Peripheral vascular disease.  Renal artery stenosis.  CAD.  COPD.     Acute pancreatitis dating back to at least 2001.  This was associated with alcohol abuse.  In 2014 there was some consideration for medication induced pancreatitis as med list included hydrochlorothiazide, lisinopril and metformin.  5/13 -06/30/2017 admission with acute on chronic pancreatitis.  Leading to admissions she had about 3 weeks of epigastric and bilateral upper quadrant pain with radiation into the back along with nausea but not a lot of vomiting, anorexia.  No GI input was sought.  Lipase 192.  Normal LFTs.  Abdominal ultrasound RUQ 06/27/17 unremarkable.  Contrasted CT abdomen pelvis showed extensive edema in the pancreatic head and first and second duodenal segments as well as the mesentery consistent with acute pancreatitis.  19 mm, multilobulated cyst between the left lobe of liver and pancreas.  Pancreatic calcifications.  Sigmoid diverticulosis was an incidental finding.  The pain, anorexia and some nausea without vomiting has persisted.  Nothing seems to help.  Symptoms are worse when she eats, there is a sense of bloating and distention along with worsening nausea.  She has oxycodone 5 mg to take for musculoskeletal pain but even when she takes 10 mg of this it does not help her abdominal pain.  Since the symptoms began in late April, she's lost about 18 pounds Now day 2 of hospital  admission for recurrent symptoms.  She is moving her bowels okay.  She has not vomited.  Pain is better controlled but not resolved with morphine. Lipase 43.  LFTs normal. AKI improved. Triglycerides 177. Hgb 9.9.  Baseline looks to be around 12.   CT of the abdomen shows what appears to be developing thick fluid collections in the region of the pancreas and mesentery.  Also shows increasing size in cystic lesion in the pancreatic head as well as second duodenum inflammation.  Patient has never seen a GI doctor or had EGD or colonoscopy.  Family history negative for ulcer disease, problems with the pancreas, gallbladder and negative for colon cancer. Patient quit alcohol more than 20 years ago.  She quit smoking about 8 years ago Patient does not work outside the home.  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  Past Medical History:  Diagnosis Date  . Asthma with COPD ([place])   . CAD (coronary artery disease)    a. [place] 2011: Diffuse distal and branch vessel disease - patient managed medically, no interventional options. b. [place] 03/2014 - low risk, no ischemia.  . Complication of anesthesia    slow to wake up with last surgery in 2011   . Diabetes mellitus with circulatory  complication ([place])   . Dyslipidemia   . Headache    hx of migraines   . Hypertension   . Osteoarthritis    severe right knee  R TKR  . PAD (peripheral artery disease) ([place])    a. Severe stenosis mid right SFA s/p atherectomy 03/25/09. b. peripheral angiography in 12/2011 which showed only about 50% diffuse right SFA stenosis.  . Pancreatitis 2003  . Renal artery stenosis ([place])    a. [place] 2011 - 40-50% left RAS.  . Tobacco use disorder    quit 11/10    Past Surgical History:  Procedure Laterality Date  . ABDOMINAL AORTAGRAM N/A 12/21/2011   Procedure: ABDOMINAL Maxcine Ham;  Surgeon: Wellington Hampshire, MD;  Location: [facility];  Service: Cardiovascular;  Laterality: N/A;  .  CARDIAC CATHETERIZATION    . LOWER EXTREMITY ANGIOGRAM Bilateral 05/27/2015   Procedure: Lower Extremity Angiogram;  Surgeon: Wellington Hampshire, MD;  Location: [facility];  Service: Cardiovascular;  Laterality: Bilateral;  . PERIPHERAL VASCULAR CATHETERIZATION N/A 05/27/2015   Procedure: Abdominal Aortogram;  Surgeon: Wellington Hampshire, MD;  Location: [facility];  Service: Cardiovascular;  Laterality: N/A;  . PERIPHERAL VASCULAR CATHETERIZATION Right 05/27/2015   Procedure: Peripheral Vascular Intervention;  Surgeon: Wellington Hampshire, MD;  Location: [facility];  Service: Cardiovascular;  Laterality: Right;  SFA  . TOTAL KNEE ARTHROPLASTY Right 2011  . TOTAL KNEE ARTHROPLASTY Left 05/22/2014   Procedure: LEFT TOTAL KNEE ARTHROPLASTY;  Surgeon: Latanya Maudlin, MD;  Location: [facility];  Service: Orthopedics;  Laterality: Left;  . TUBAL LIGATION  1980    Prior to Admission medications   Medication Sig Start Date End Date Taking? Authorizing Provider  ADVAIR DISKUS 250-50 MCG/DOSE AEPB INHALE 1 PUFF INTO THE LUNGS EVERY 12 HOURS 02/02/17  Yes , Modena Nunnery, MD  aspirin 81 MG tablet Take 81 mg by mouth daily.     Yes [provider]  Biotin w/ Vitamins C & E (HAIR/SKIN/NAILS PO) Take 1 tablet by mouth daily.   Yes [provider]  bisoprolol-hydrochlorothiazide [place]) 2.5-6.25 MG tablet TAKE 1 TABLET BY MOUTH DAILY 06/14/17  Yes Susy Frizzle, MD  clopidogrel (PLAVIX) 75 MG tablet Take 1 tablet (75 mg total) by mouth daily. 04/06/17  Yes Wellington Hampshire, MD  diclofenac sodium (VOLTAREN) 1 % GEL Apply 2 g topically 4 (four) times daily. Patient taking differently: Apply 2 g topically 4 (four) times daily as needed (pain).  05/04/16  Yes Susy Frizzle, MD  ferrous sulfate 325 (65 FE) MG tablet Take 325 mg by mouth daily with breakfast.   Yes [provider]  fish oil-omega-3 fatty acids 1000 MG capsule Take 2 capsules (2 g total) by mouth 2 (two)  times daily. 02/03/12  Yes Larey Dresser, MD  glimepiride (AMARYL) 4 MG tablet TAKE 1 TABLET BY MOUTH EVERY DAY WITH LUNCH 04/24/17  Yes Susy Frizzle, MD  isosorbide mononitrate (IMDUR) 60 MG 24 hr tablet TAKE 1 TABLET BY MOUTH DAILY 05/29/17  Yes Susy Frizzle, MD  lisinopril (PRINIVIL,ZESTRIL) 10 MG tablet TAKE 1 TABLET BY MOUTH DAILY 05/29/17  Yes Susy Frizzle, MD  methocarbamol (ROBAXIN) 500 MG tablet TAKE 1 TABLET BY MOUTH EVERY 6 HOURS AS NEEDED FOR MUSCLE SPASMS 06/16/17  Yes Susy Frizzle, MD  Multiple Vitamin (MULTIVITAMIN) tablet Take 1 tablet by mouth daily.     Yes [provider]  ondansetron (ZOFRAN) 4 MG tablet TAKE  1 TABLET(4 MG) BY MOUTH EVERY 8 HOURS AS NEEDED FOR NAUSEA OR VOMITING 06/27/17  Yes Susy Frizzle, MD  pantoprazole (PROTONIX) 40 MG tablet TAKE 1 TABLET BY MOUTH DAILY 04/03/17  Yes Susy Frizzle, MD  pioglitazone (ACTOS) 30 MG tablet TAKE 1 TABLET(30 MG) BY MOUTH DAILY 12/26/16  Yes Susy Frizzle, MD  Probiotic Product (TRUBIOTICS PO) Take 1 capsule by mouth daily.   Yes [provider]  VENTOLIN HFA 108 (90 Base) MCG/ACT inhaler INHALE 2 PUFFS BY MOUTH TWICE DAILY AS NEEDED FOR WHEEZING OR SHORTNESS OF BREATH 08/16/16  Yes Susy Frizzle, MD  Blood Glucose Monitoring Suppl (BAYER CONTOUR MONITOR) w/Device KIT CHECK BLOOD SUGAR EVERY DAY 08/20/15   Susy Frizzle, MD  CONTOUR NEXT TEST test strip CHECK BLOOD SUGAR EVERY DAY 09/05/16   Susy Frizzle, MD  metFORMIN (GLUCOPHAGE) 1000 MG tablet TAKE 1 TABLET BY MOUTH TWICE DAILY WITH MEALS 07/17/17   Susy Frizzle, MD  pioglitazone (ACTOS) 30 MG tablet TAKE 1 TABLET BY MOUTH DAILY 07/17/17   Susy Frizzle, MD  rosuvastatin (CRESTOR) 40 MG tablet TAKE 1 TABLET BY MOUTH DAILY 07/17/17   Susy Frizzle, MD    Scheduled Meds: . aspirin EC  81 mg Oral Daily  . clopidogrel  75 mg Oral Daily  . enoxaparin (LOVENOX) injection  40 mg Subcutaneous Q24H  . insulin aspart  0-9  Units Subcutaneous Q4H  . mometasone-formoterol  2 puff Inhalation BID  . multivitamin with minerals  1 tablet Oral Daily  . pantoprazole  40 mg Oral Daily  . rosuvastatin  40 mg Oral Daily   Infusions: . sodium chloride 125 mL/hr at 07/17/17 0638   PRN Meds: acetaminophen **OR** acetaminophen, albuterol, diclofenac sodium, methocarbamol, morphine injection, ondansetron **OR** ondansetron (ZOFRAN) IV, oxyCODONE-acetaminophen, phenol   Allergies as of 07/14/2017 - Review Complete 07/14/2017  Allergen Reaction Noted  . Gabapentin Other (See Comments)   . Glucotrol [glipizide] Rash and Other (See Comments) 07/26/2011  . Influenza vaccines Other (See Comments) 12/02/2011  . Latex Rash   . Nortriptyline Other (See Comments)   . Tetanus toxoid Hives, Swelling, Rash, and Other (See Comments)   . Invokana [canagliflozin] Other (See Comments) 04/17/2014    Family History  Problem Relation Age of Onset  . CVA Mother   . Emphysema Father   . Breast cancer Sister 62    Social History   Socioeconomic History  . Marital status: Married    Spouse name: Not on file  . Number of children: Not on file  . Years of education: 47  . Highest education level: Not on file  Occupational History  . Not on file  Social Needs  . Financial resource strain: Not on file  . Food insecurity:    Worry: Not on file    Inability: Not on file  . Transportation needs:    Medical: Not on file    Non-medical: Not on file  Tobacco Use  . Smoking status: Former Smoker    Last attempt to quit: 12/13/2008    Years since quitting: 8.5  . Smokeless tobacco: Never Used  Substance and Sexual Activity  . Alcohol use: No    Comment: Quit drinking around year 2000  . Drug use: No    Comment: Patient quit smoking around 2011  . Sexual activity: Not on file  Lifestyle  . Physical activity:    Days per week: Not on file    Minutes per  session: Not on file  . Stress: Not on file  Relationships  . Social  connections:    Talks on phone: Not on file    Gets together: Not on file    Attends religious service: Not on file    Active member of club or organization: Not on file    Attends meetings of clubs or organizations: Not on file    Relationship status: Not on file  . Intimate partner violence:    Fear of current or ex partner: Not on file    Emotionally abused: Not on file    Physically abused: Not on file    Forced sexual activity: Not on file  Other Topics Concern  . Not on file  Social History Narrative   Denies any IV drug use or marijuana use.  Former smoker for 30 years, quit Nov. 2010. She is married with 2 children. Disabled from knee, not working.    REVIEW OF SYSTEMS: Constitutional: Weakness and fatigue in recent days ENT:  No nose bleeds Pulm: Somewhat chronic, mostly nonproductive, cough.  Dyspnea if she climbs many stairs or if she tries to walk too fast but generally does not have problems with dyspnea. CV:  No palpitations, no LE edema.  GU:  No hematuria, no frequency GI:  Per HPI Heme: No unusual or excessive bleeding or bruising.  No p.o. iron at home. Transfusions: None.   Neuro:  No headaches, no peripheral tingling or numbness Derm:  No itching, no rash or sores.  Endocrine:  No sweats or chills.  No polyuria or dysuria Immunization: Reviewed. Travel:  None beyond local counties in last few months.    PHYSICAL EXAM: Vital signs in last 24 hours: Vitals:   07/17/17 0903 07/17/17 1419  BP:  (!) 122/105  Pulse: 76 73  Resp: 18 16  Temp:  97.9 F (36.6 C)  SpO2: 92%    Wt Readings from Last 3 Encounters:  07/14/17 182 lb (82.6 kg)  07/07/17 187 lb (84.8 kg)  06/30/17 193 lb 4 oz (87.7 kg)    General: Moderately unwell but overall comfortable and not acutely ill-appearing WF.  She is overweight. Head: No facial asymmetry or swelling.  No signs of head trauma. Eyes: No scleral icterus.  No conjunctival pallor.  EOMI. Ears: Not hard of  hearing. Nose: No congestion, no discharge. Mouth: Full dentures in place.  Tongue midline.  Oral mucosa slightly dry but clear. Neck: No JVD, no masses, no thyromegaly. Lungs: ronchorous cough.  Lungs clear bilaterally without wheezing. Heart: RRR.  No MRG.  S1, S2 present Abdomen: Soft.  Slight epigastric tenderness without guarding or rebound.  No HSM, masses, bruits, hernias..   Rectal: Deferred Musc/Skeltl: No joint redness, swelling or significant deformity.  Linear scars on both knees. Extremities: No CCE. Neurologic: Alert.  Oriented x3.  Good historian.  No tremor, no limb weakness.  No gross deficits. Skin: Suntanned. Tattoos: Yes, small tattoo on the lower lateral right leg and right groin. Nodes: No cervical or inguinal adenopathy. Psych: Was not, cooperative.  Fluid speech.  Intake/Output from previous day: 06/02 0701 - 06/03 0700 In: 720 [P.O.:720] Out: -  Intake/Output this shift: Total I/O In: 240 [P.O.:240] Out: -   LAB RESULTS: Recent Labs    07/15/17 0813 07/16/17 0345  WBC 5.0 5.5  HGB 10.5* 9.9*  HCT 34.3* 32.7*  PLT 349 351   BMET Lab Results  Component Value Date   NA 140 07/16/2017   NA  141 07/15/2017   NA 137 07/14/2017   K 4.2 07/16/2017   K 4.0 07/15/2017   K 4.4 07/14/2017   CL 107 07/16/2017   CL 106 07/15/2017   CL 98 (L) 07/14/2017   CO2 24 07/16/2017   CO2 25 07/15/2017   CO2 26 07/14/2017   GLUCOSE 139 (H) 07/16/2017   GLUCOSE 95 07/15/2017   GLUCOSE 140 (H) 07/14/2017   BUN 10 07/16/2017   BUN 18 07/15/2017   BUN 29 (H) 07/14/2017   CREATININE 1.09 (H) 07/16/2017   CREATININE 1.17 (H) 07/15/2017   CREATININE 1.64 (H) 07/14/2017   CALCIUM 8.9 07/16/2017   CALCIUM 9.2 07/15/2017   CALCIUM 10.1 07/14/2017   LFT Recent Labs    07/15/17 0813 07/16/17 0345  PROT 6.1* 5.8*  ALBUMIN 3.2* 3.1*  AST 20 20  ALT 16 16  ALKPHOS 57 52  BILITOT 0.5 0.2*   PT/INR Lab Results  Component Value Date   INR 1.03 05/27/2015    INR 0.88 05/13/2014   INR 1.0 12/14/2011   Hepatitis Panel No results for input(s): HEPBSAG, HCVAB, HEPAIGM, HEPBIGM in the last 72 hours. C-Diff No components found for: CDIFF Lipase     Component Value Date/Time   LIPASE 43 07/14/2017 1116    Drugs of Abuse  No results found for: LABOPIA, COCAINSCRNUR, LABBENZ, AMPHETMU, THCU, LABBARB   RADIOLOGY STUDIES: No results found.   IMPRESSION:   *    Upper abdominal pain, nausea, anorexia/early satiety in patient with chronic pancreatitis, recent acute pancreatitis and evolving peripancreatic fluid collections.  Current lipase and LFTs normal.  Has not consumed alcohol in 20 years.  She does take some medications which are known linked to pancreatitis such as metformin, lisinopril, hydrochlorothiazide, pantoprazole. Neither ultrasound or CT have confirmed any presence of gallstones so biliary pancreatitis is unlikely.  *    Fatty liver.  Patient abstinent from alcohol for 20 years.  *   NIDDM.    *   CAD, understandable disease.  ASPVD.  On chronic Plavix, not currently on hold.  *    Normocytic anemia.   PLAN:     *   MRCP would be helpful, but patient says she needs to be "knocked out" in order to undergo MRI. At this point to restart knocking some of the potentially culprit meds off her med list. ? ERCP, ? EGD.   Note that I briefly mentioned colonoscopy when I asked her if she never had one, she let me know she was not interested in having a colonoscopy we did not discuss it further   Azucena Freed  07/17/2017, 3:36 PM Phone [phone]  I have reviewed the entire case in detail with the above APP and discussed the plan in detail.  Therefore, I agree with the diagnoses recorded above. In addition,  I have personally interviewed and examined the patient and have personally reviewed any abdominal/pelvic CT scan images.  My additional thoughts are as follows:   She has chronic relapsing pancreatitis and appears to still  be dealing with the sequelae of the recent episode. There are involving inflammatory and fluid collections, but nothing that appears to be infected, gas-containing or requiring drainage. She is well-appearing. She has not had alcohol use for many years, and also quit smoking 8 years ago. She clearly has chronic pancreatitis with calcifications seen on CT scan, and I suspect there is architectural distortion of the pancreatic duct. That is most likely leading to recurrent low-grade  obstruction of the pancreatic duct, and there are possibly pancreatic duct stones.  I am not highly suspicious that her current medicines are the cause of pancreatitis and it does not appear to be biliary pancreatitis. LFTs are persistently normal.that said, if her HCTZ or lisinopril could either be discontinued or changed to alternate agents, that may be just as well her to factor them out of this picture.  At this point, I think she needs adequate pain control, a low-fat diet with sufficient dose of pancreatic enzyme supplements, and some time for this to improve. While an MR CP would be helpful to understand the anatomy of the pancreatic duct, I am concerned that it may not be an optimal study at this point since there is pancreatic and peripancreatic inflammation and fluid collections causing pain. That and her foster phobia would likely give suboptimal images to answer the question at hand.  I am hopeful that with pain control and pancreatic enzymes to be well enough to leave the hospital soon, at which point this can be revisited soon in clinic. Then we can determine the appropriate timing of MRCP. Based on those findings, she might then require consultation at a tertiary care center if there is anything like pancreatic duct stricture or stone amenable to endoscopic intervention, or even celiac plexus neuro lysis.  We will follow.    [name] III [phone]

## 2017-07-18 ENCOUNTER — Encounter (HOSPITAL_COMMUNITY): Payer: Self-pay | Admitting: General Practice

## 2017-07-18 ENCOUNTER — Other Ambulatory Visit: Payer: Self-pay

## 2017-07-18 DIAGNOSIS — R110 Nausea: Secondary | ICD-10-CM

## 2017-07-18 LAB — CBC
HCT: 32.5 % — ABNORMAL LOW (ref 36.0–46.0)
HEMOGLOBIN: 9.6 g/dL — AB (ref 12.0–15.0)
MCH: 27 pg (ref 26.0–34.0)
MCHC: 29.5 g/dL — ABNORMAL LOW (ref 30.0–36.0)
MCV: 91.5 fL (ref 78.0–100.0)
Platelets: 282 10*3/uL (ref 150–400)
RBC: 3.55 MIL/uL — AB (ref 3.87–5.11)
RDW: 14.4 % (ref 11.5–15.5)
WBC: 3.7 10*3/uL — ABNORMAL LOW (ref 4.0–10.5)

## 2017-07-18 LAB — BASIC METABOLIC PANEL
ANION GAP: 8 (ref 5–15)
BUN: 7 mg/dL (ref 6–20)
CALCIUM: 9 mg/dL (ref 8.9–10.3)
CO2: 27 mmol/L (ref 22–32)
Chloride: 107 mmol/L (ref 101–111)
Creatinine, Ser: 1.13 mg/dL — ABNORMAL HIGH (ref 0.44–1.00)
GFR calc non Af Amer: 52 mL/min — ABNORMAL LOW (ref >60)
GFR, EST AFRICAN AMERICAN: 60 mL/min — AB (ref >60)
Glucose, Bld: 177 mg/dL — ABNORMAL HIGH (ref 65–99)
POTASSIUM: 3.9 mmol/L (ref 3.5–5.1)
Sodium: 142 mmol/L (ref 135–145)

## 2017-07-18 LAB — GLUCOSE, CAPILLARY
GLUCOSE-CAPILLARY: 76 mg/dL (ref 65–99)
GLUCOSE-CAPILLARY: 104 mg/dL — AB (ref 65–99)
Glucose-Capillary: 98 mg/dL (ref 65–99)
Glucose-Capillary: 138 mg/dL — ABNORMAL HIGH (ref 65–99)
Glucose-Capillary: 149 mg/dL — ABNORMAL HIGH (ref 65–99)
Glucose-Capillary: 119 mg/dL — ABNORMAL HIGH (ref 65–99)

## 2017-07-18 LAB — MAGNESIUM: MAGNESIUM: 1.6 mg/dL — AB (ref 1.7–2.4)

## 2017-07-18 MED ORDER — PANCRELIPASE (LIP-PROT-AMYL) 36000-114000 UNITS PO CPEP: 72000.0000 [IU] | ORAL_CAPSULE | Freq: Three times a day (TID) | ORAL | 1 refills | Status: DC

## 2017-07-18 NOTE — Progress Notes (Addendum)
PROGRESS NOTE  [name] ZOX:[id number] DOB: 09-03-1955 DOA: 07/14/2017 PCP: Donita Brooks, MD  HPI/Recap of past 24 hours:  Feeling better since she is started on pancreatic enzyme Though She still requires iv morphine for pain control  Am lab still pending,  No vomiting, no fever  Assessment/Plan: Principal Problem:   Acute on chronic pancreatitis (HCC) Active Problems:   Diabetes mellitus, type II (HCC)   Essential hypertension   CAD (coronary artery disease)   Acute pancreatitis   AKI (acute kidney injury) (HCC)   Pancreatic pseudocyst   Recurrent pancreatitis -I have reviewed her past records, she has been having signs of pancreatitis on CT ab as early as from 2000 -she was recently hospitalized for the same from 5/13 and 5/17 -triglyceride 177, she does not drink , does not smoke currently, no h/o gallstone -CT ab on presentation: "1. The developing masslike regions in the mesentery anterior and inferior to the pancreas are likely sequela of recent pancreatitis. They demonstrate mixed attenuation and I suspect they represent developing thick fluid collections. There is some mild surrounding stranding remaining. 2. There is a 18 x 10 mm cyst in the pancreatic head which is larger in the interval, likely sequela of recent pancreatitis. 3. A 13 mm mass off the upper pole the right kidney is likely a hyperdense cyst but nonspecific. Recommend correlation with ultrasound for confirmation of its cystic nature. 4. Probable mild persistent inflammation of the distal second portion of the duodenum from recent pancreatitis. -GI consulted, input appreciated,     AKI on CKDIII -cr 1.6 on presentation, cr 1.09 today ( baseline appear to be around 0.95) -renal dosing meds  Noninsulin dependent dm2 Recent a1c 6.7 Home oral meds held On ssi She does not eat much due to pancreatitis  On hypoglycemia protocal   CAD, last cath in 2011, no intervention, medical  management (asa, plavix,statin) Denies chest pain  PAD s/p angioplasty and stent to her right leg, on asa/plavix/statin  HTN;  -she presented with hypotension, likely due to dehydration due to not eating -Home bp meds bisoprolol-hctz, imdur, lisinopril, held on admission  -bp improving, resume bisoprolol  Anemia of chronic disease Monitor cbc  Asymptomatic bacteuria -urine culture + for klebsiella pneumonia from three days ago -she does not appear symptomatic -no abx   H/o Asthma with COPD No wheezing, no hypoxia Continue home meds     Code Status: full  Family Communication: patient   Disposition Plan: home hopefully on 6/5  if pain under control with oral meds.  need gi clearance   Consultants:  LBGI  Procedures:  none  Antibiotics:  none   Objective: BP (!) 112/91 (BP Location: Left Arm)   Pulse 78   Temp 97.8 F (36.6 C) (Oral)   Resp 16   SpO2 94%   Intake/Output Summary (Last 24 hours) at 07/18/2017 1512 Last data filed at 07/18/2017 0900 Gross per 24 hour  Intake 480 ml  Output -  Net 480 ml   There were no vitals filed for this visit.  Exam: Patient is examined daily including today on 07/18/2017, exams remain the same as of yesterday except that has changed    General:  NAD  Cardiovascular: RRR  Respiratory: CTABL  Abdomen: Soft/ND/NT, positive BS  Musculoskeletal: No Edema  Neuro: alert, oriented   Data Reviewed: Basic Metabolic Panel: Recent Labs  Lab 07/14/17 1116 07/15/17 0813 07/16/17 0345  NA 137 141 140  K 4.4 4.0 4.2  CL  98* 106 107  CO2 26 25 24   GLUCOSE 140* 95 139*  BUN 29* 18 10  CREATININE 1.64* 1.17* 1.09*  CALCIUM 10.1 9.2 8.9   Liver Function Tests: Recent Labs  Lab 07/14/17 1116 07/15/17 0813 07/16/17 0345  AST 21 20 20   ALT 16 16 16   ALKPHOS 70 57 52  BILITOT 0.7 0.5 0.2*  PROT 7.8 6.1* 5.8*  ALBUMIN 3.8 3.2* 3.1*   Recent Labs  Lab 07/14/17 1116 07/16/17 0345  LIPASE 43  --   AMYLASE   --  85   No results for input(s): AMMONIA in the last 168 hours. CBC: Recent Labs  Lab 07/14/17 1116 07/15/17 0813 07/16/17 0345 07/18/17 1432  WBC 6.9 5.0 5.5 3.7*  HGB 11.6* 10.5* 9.9* 9.6*  HCT 37.8 34.3* 32.7* 32.5*  MCV 88.5 91.0 90.6 91.5  PLT 440* 349 351 282   Cardiac Enzymes:   No results for input(s): CKTOTAL, CKMB, CKMBINDEX, TROPONINI in the last 168 hours. BNP (last 3 results) No results for input(s): BNP in the last 8760 hours.  ProBNP (last 3 results) No results for input(s): PROBNP in the last 8760 hours.  CBG: Recent Labs  Lab 07/17/17 2123 07/18/17 0109 07/18/17 0432 07/18/17 0800 07/18/17 1123  GLUCAP 81 98 138* 76 149*    Recent Results (from the past 240 hour(s))  Urine Culture     Status: Abnormal   Collection Time: 07/14/17 11:21 AM  Result Value Ref Range Status   Specimen Description URINE, CLEAN CATCH  Final   Special Requests   Final    NONE Performed at [facility], [address], [place], [place] [address]    Culture >=100,000 COLONIES/mL KLEBSIELLA PNEUMONIAE (A)  Final   Report Status 07/16/2017 FINAL  Final   Organism ID, Bacteria KLEBSIELLA PNEUMONIAE (A)  Final      Susceptibility   Klebsiella pneumoniae - MIC*    AMPICILLIN >=32 RESISTANT Resistant     CEFAZOLIN <=4 SENSITIVE Sensitive     CEFTRIAXONE <=1 SENSITIVE Sensitive     CIPROFLOXACIN <=0.25 SENSITIVE Sensitive     GENTAMICIN <=1 SENSITIVE Sensitive     IMIPENEM <=0.25 SENSITIVE Sensitive     NITROFURANTOIN 64 INTERMEDIATE Intermediate     TRIMETH/SULFA <=20 SENSITIVE Sensitive     AMPICILLIN/SULBACTAM 4 SENSITIVE Sensitive     PIP/TAZO <=4 SENSITIVE Sensitive     Extended ESBL NEGATIVE Sensitive     * >=100,000 COLONIES/mL KLEBSIELLA PNEUMONIAE     Studies: No results found.  Scheduled Meds: . aspirin EC  81 mg Oral Daily  . bisoprolol  2.5 mg Oral Daily  . clopidogrel  75 mg Oral Daily  . enoxaparin (LOVENOX) injection  40 mg Subcutaneous Q24H    . insulin aspart  0-9 Units Subcutaneous Q4H  . lipase/protease/amylase  72,000 Units Oral TID AC  . mometasone-formoterol  2 puff Inhalation BID  . multivitamin with minerals  1 tablet Oral Daily  . pantoprazole  40 mg Oral Daily  . rosuvastatin  40 mg Oral Daily    Continuous Infusions: . sodium chloride 125 mL/hr at 07/18/17 6045     Time spent: I have personally reviewed and interpreted on  07/18/2017 daily labs, imagings as discussed above under date review session and assessment and plans.  I reviewed all nursing notes, consult note, vitals, pertinent old records  I have discussed plan of care as described above with RN , patient on 07/18/2017   Albertine Grates MD, PhD  [facility] Pager [phone]. If 7PM-7AM, please contact night-coverage at [email or web address], password [email or web address] 07/18/2017, 3:12 PM  LOS: 3 days

## 2017-07-18 NOTE — Progress Notes (Addendum)
Daily Rounding Note  07/18/2017, 11:29 AM  LOS: 3 days   SUBJECTIVE:   Chief complaint: chronic pancreatitis  Epigastric abd pain is better but not resolved.  Getting fairly regular doses Morphine.   Tolerating solid food.  Feels like the Creon is helping. Asking when she might discharge home.    OBJECTIVE:         Vital signs in last 24 hours:    Temp:  [97.6 F (36.4 C)-98.3 F (36.8 C)] 97.6 F (36.4 C) (06/04 0452) Pulse Rate:  [72-85] 85 (06/04 0929) Resp:  [16] 16 (06/04 0452) BP: (115-150)/(63-105) 126/63 (06/04 0929) SpO2:  [98 %-100 %] 100 % (06/04 0452) Last BM Date: 07/16/17 There were no vitals filed for this visit. General: looks better, alert, comfortable   Heart: RRR Chest: clear bil.   Abdomen: soft, minimal if any tenderness in upper abdomen.  BS hypoactive  Extremities: no CCE Neuro/Psych:  Oriented x 3.  Fully alert.  No gross deficits.    Intake/Output from previous day: 06/03 0701 - 06/04 0700 In: 480 [P.O.:480] Out: -   Intake/Output this shift: Total I/O In: 240 [P.O.:240] Out: -   Lab Results: Recent Labs    07/16/17 0345  WBC 5.5  HGB 9.9*  HCT 32.7*  PLT 351   BMET Recent Labs    07/16/17 0345  NA 140  K 4.2  CL 107  CO2 24  GLUCOSE 139*  BUN 10  CREATININE 1.09*  CALCIUM 8.9   LFT Recent Labs    07/16/17 0345  PROT 5.8*  ALBUMIN 3.1*  AST 20  ALT 16  ALKPHOS 52  BILITOT 0.2*    Studies/Results: No results found.   Scheduled Meds: . aspirin EC  81 mg Oral Daily  . bisoprolol  2.5 mg Oral Daily  . clopidogrel  75 mg Oral Daily  . enoxaparin (LOVENOX) injection  40 mg Subcutaneous Q24H  . insulin aspart  0-9 Units Subcutaneous Q4H  . lipase/protease/amylase  72,000 Units Oral TID AC  . mometasone-formoterol  2 puff Inhalation BID  . multivitamin with minerals  1 tablet Oral Daily  . pantoprazole  40 mg Oral Daily  . rosuvastatin  40 mg Oral Daily    Continuous Infusions: . 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  Symptoms of pain, nausea, anorexia, early satiety have improved.  Creon added 6/3.      *   Chronic Plavix.  Unstentable CAD.  ASPVD.     PLAN   *  Needs to not require morphine prior to discharge.  Would add prn hydrocodone 5/325 mg prn as first line pain med, this is what she uses for LE pain at home.      Jennye MoccasinSarah Gribbin  07/18/2017, 11:29 AM Phone [phone]   I have discussed the case with the PA, and that is the plan I formulated. I personally interviewed and examined the patient.  CC: chronic pancreatitis Epigastric pain  Nausea  She is much improved today, less pain, no nausea.  Tolerating food with panc enzymes.  Last IV morphine 7am today.  Plan is home tomorrow with creon 36,000 , 2 capsules with meals, one with snacks.  Oral pain medicine.  To be managed by primary care or pain clinic afterwards if needs long term.  We will arrange office follow up with me in several weeks.   Discussed with her and her husband.    Charlie Pitter III Office: [phone]

## 2017-07-19 LAB — CBC
HCT: 32 % — ABNORMAL LOW (ref 36.0–46.0)
HEMOGLOBIN: 9.6 g/dL — AB (ref 12.0–15.0)
MCH: 27.3 pg (ref 26.0–34.0)
MCHC: 30 g/dL (ref 30.0–36.0)
MCV: 90.9 fL (ref 78.0–100.0)
Platelets: 279 10*3/uL (ref 150–400)
RBC: 3.52 MIL/uL — AB (ref 3.87–5.11)
RDW: 14.4 % (ref 11.5–15.5)
WBC: 3.2 10*3/uL — AB (ref 4.0–10.5)

## 2017-07-19 LAB — COMPREHENSIVE METABOLIC PANEL
ALBUMIN: 2.9 g/dL — AB (ref 3.5–5.0)
ALK PHOS: 60 U/L (ref 38–126)
ALT: 18 U/L (ref 14–54)
ANION GAP: 3 — AB (ref 5–15)
AST: 19 U/L (ref 15–41)
BUN: 6 mg/dL (ref 6–20)
CALCIUM: 8.8 mg/dL — AB (ref 8.9–10.3)
CO2: 24 mmol/L (ref 22–32)
CREATININE: 1.05 mg/dL — AB (ref 0.44–1.00)
Chloride: 115 mmol/L — ABNORMAL HIGH (ref 101–111)
GFR calc Af Amer: >60 mL/min (ref >60)
GFR calc non Af Amer: 57 mL/min — ABNORMAL LOW (ref >60)
GLUCOSE: 130 mg/dL — AB (ref 65–99)
Potassium: 4 mmol/L (ref 3.5–5.1)
SODIUM: 142 mmol/L (ref 135–145)
Total Bilirubin: 0.3 mg/dL (ref 0.3–1.2)
Total Protein: 5.7 g/dL — ABNORMAL LOW (ref 6.5–8.1)

## 2017-07-19 LAB — GLUCOSE, CAPILLARY
GLUCOSE-CAPILLARY: 102 mg/dL — AB (ref 65–99)
GLUCOSE-CAPILLARY: 99 mg/dL (ref 65–99)
Glucose-Capillary: 108 mg/dL — ABNORMAL HIGH (ref 65–99)
Glucose-Capillary: 158 mg/dL — ABNORMAL HIGH (ref 65–99)

## 2017-07-19 LAB — MAGNESIUM: Magnesium: 1.6 mg/dL — ABNORMAL LOW (ref 1.7–2.4)

## 2017-07-19 MED ORDER — MAGNESIUM SULFATE 2 GM/50ML IV SOLN
2.0000 g | Freq: Once | INTRAVENOUS | Status: AC
  Administered 2017-07-19: 2 g via INTRAVENOUS
  Filled 2017-07-19: qty 50

## 2017-07-19 MED ORDER — BISOPROLOL FUMARATE 5 MG PO TABS: 2.5000 mg | ORAL_TABLET | Freq: Every day | ORAL | 0 refills | Status: DC

## 2017-07-19 NOTE — Telephone Encounter (Signed)
Patient is requesting a refill on Hydrocodone   LOV:  LRF:

## 2017-07-19 NOTE — Discharge Summary (Addendum)
Physician Discharge Summary  [name] ZJI:[id number] DOB: 08/03/1955 DOA: 07/14/2017  PCP: Susy Frizzle, MD  Admit date: 07/14/2017 Discharge date: 07/19/2017  Admitted From: Home  Disposition:  Home   Recommendations for Outpatient Follow-up:  1. Follow up with PCP in 1-2 weeks 2. Please obtain BMP/CBC in one week 3. Needs follow up for renal lesion.  4. Needs follow up with GI for pancreatic pseudocyst and recurrent pancreatitis.   Home Health: yes  Discharge Condition: stable.  CODE STATUS: full code.  Diet recommendation: Heart Healthy  Brief/Interim Summary:  Recurrent pancreatitis -she has been having signs of pancreatitis on CT ab as early as from 2000 -she was recently hospitalized for the same from 5/13 and 5/17 -triglyceride 177, she does not drink , does not smoke currently, no h/o gallstone -CT ab on presentation: "1. The developing masslike regions in the mesentery anterior and inferior to the pancreas are likely sequela of recent pancreatitis. They demonstrate mixed attenuation and I suspect they represent developing thick fluid collections. There is some mild surrounding stranding remaining. 2. There is a 18 x 10 mm cyst in the pancreatic head which is larger in the interval, likely sequela of recent pancreatitis. 3. A 13 mm mass off the upper pole the right kidney is likely a hyperdense cyst but nonspecific. Recommend correlation with ultrasound for confirmation of its cystic nature. 4. Probable mild persistent inflammation of the distal second portion of the duodenum from recent pancreatitis. -GI consulted, input appreciated,  -started on pancreatic enzymes. Tolerating diet. Ok to discharge today per GI.   A 13 mm mass off the upper pole the right kidney ; needs [place], follow up.  Patient aware.   Hypomagnesemia; replaced IV.   AKI on CKDIII -cr 1.6 on presentation, cr 1.09 today ( baseline appear to be around 0.95) -renal dosing meds stable.  Hold  diuretic at discharge   Noninsulin dependent dm2 Recent a1c 6.7 Home oral meds held On ssi She does not eat much due to pancreatitis  Hold oral hypoglycemic agents at discharge. Will resume only metformin.    CAD, last cath in 2011, no intervention, medical management (asa, plavix,statin) Denies chest pain  PAD s/p angioplasty and stent to her right leg, on asa/plavix/statin  HTN;  -she presented with hypotension, likely due to dehydration due to not eating -Home bp meds bisoprolol-hctz, imdur, lisinopril, held on admission  -bp improving, resume bisoprolol. Hold further meds.   Anemia of chronic disease Monitor cbc  Asymptomatic bacteuria -urine culture + for klebsiella pneumonia from three days ago -she does not appear symptomatic -no abx   H/o Asthma with COPD No wheezing, no hypoxia Continue home meds        Discharge Diagnoses:  Principal Problem:   Acute on chronic pancreatitis ([place]) Active Problems:   Diabetes mellitus, type II ([place])   Essential hypertension   CAD (coronary artery disease)   Acute pancreatitis   AKI (acute kidney injury) [facility])   Pancreatic pseudocyst    Discharge Instructions  Discharge Instructions    Increase activity slowly   Complete by:  As directed      Allergies as of 07/19/2017      Reactions   Gabapentin Other (See Comments)   Brain shakes   Glucotrol [glipizide] Rash, Other (See Comments)   Red rash   Influenza Vaccines Other (See Comments)   Significant arm soreness requiring 1 year of physical therapy   Latex Rash   Nortriptyline Other (See Comments)  BRAIN SHAKE   Tetanus Toxoid Hives, Swelling, Rash, Other (See Comments)   Swelling to site of injection with rash and fever   Invokana [canagliflozin] Other (See Comments)   Yeast Infections      Medication List    STOP taking these medications   bisoprolol-hydrochlorothiazide 2.5-6.25 MG tablet Commonly known as:  ZIAC   fish oil-omega-3  fatty acids 1000 MG capsule   glimepiride 4 MG tablet Commonly known as:  AMARYL   lisinopril 10 MG tablet Commonly known as:  PRINIVIL,ZESTRIL   pioglitazone 30 MG tablet Commonly known as:  ACTOS     TAKE these medications   ADVAIR DISKUS 250-50 MCG/DOSE Aepb Generic drug:  Fluticasone-Salmeterol INHALE 1 PUFF INTO THE LUNGS EVERY 12 HOURS   aspirin 81 MG tablet Take 81 mg by mouth daily.   BAYER CONTOUR MONITOR w/Device Kit CHECK BLOOD SUGAR EVERY DAY   bisoprolol 5 MG tablet Commonly known as:  ZEBETA Take 0.5 tablets (2.5 mg total) by mouth daily. Start taking on:  07/20/2017   clopidogrel 75 MG tablet Commonly known as:  PLAVIX Take 1 tablet (75 mg total) by mouth daily.   CONTOUR NEXT TEST test strip Generic drug:  glucose blood CHECK BLOOD SUGAR EVERY DAY   diclofenac sodium 1 % Gel Commonly known as:  VOLTAREN Apply 2 g topically 4 (four) times daily. What changed:    when to take this  reasons to take this   ferrous sulfate 325 (65 FE) MG tablet Take 325 mg by mouth daily with breakfast.   HAIR/SKIN/NAILS PO Take 1 tablet by mouth daily.   isosorbide mononitrate 60 MG 24 hr tablet Commonly known as:  IMDUR TAKE 1 TABLET BY MOUTH DAILY   lipase/protease/amylase 36000 UNITS Cpep capsule Commonly known as:  CREON Take 2 capsules (72,000 Units total) by mouth 3 (three) times daily before meals.   metFORMIN 1000 MG tablet Commonly known as:  GLUCOPHAGE TAKE 1 TABLET BY MOUTH TWICE DAILY WITH MEALS   methocarbamol 500 MG tablet Commonly known as:  ROBAXIN TAKE 1 TABLET BY MOUTH EVERY 6 HOURS AS NEEDED FOR MUSCLE SPASMS   multivitamin tablet Take 1 tablet by mouth daily.   ondansetron 4 MG tablet Commonly known as:  ZOFRAN TAKE 1 TABLET(4 MG) BY MOUTH EVERY 8 HOURS AS NEEDED FOR NAUSEA OR VOMITING   pantoprazole 40 MG tablet Commonly known as:  PROTONIX TAKE 1 TABLET BY MOUTH DAILY   rosuvastatin 40 MG tablet Commonly known as:   CRESTOR TAKE 1 TABLET BY MOUTH DAILY   TRUBIOTICS PO Take 1 capsule by mouth daily.   VENTOLIN HFA 108 (90 Base) MCG/ACT inhaler Generic drug:  albuterol INHALE 2 PUFFS BY MOUTH TWICE DAILY AS NEEDED FOR WHEEZING OR SHORTNESS OF BREATH      Follow-up Information    Doran Stabler, MD Follow up on 08/25/2017.   Specialty:  Gastroenterology Why:  4 PM follow up with MD for pancreatitis.   Contact information: [address] [place] [address] [phone]          Allergies  Allergen Reactions  . Gabapentin Other (See Comments)    Brain shakes  . Glucotrol [Glipizide] Rash and Other (See Comments)    Red rash  . Influenza Vaccines Other (See Comments)    Significant arm soreness requiring 1 year of physical therapy  . Latex Rash  . Nortriptyline Other (See Comments)    BRAIN SHAKE  . Tetanus Toxoid Hives,  Swelling, Rash and Other (See Comments)    Swelling to site of injection with rash and fever  . Invokana [Canagliflozin] Other (See Comments)    Yeast Infections    Consultations:  GI   Procedures/Studies: Dg Chest 2 View  Result Date: 06/26/2017 CLINICAL DATA:  Pt states she has a history of pancreatitis and this feels similar with side and abdominal pain but also has a component of chest pain/sharp/pressure radiating to back. Constant. Vomiting and nausea present. Cannot tolerate po without increased pain. EXAM: CHEST - 2 VIEW COMPARISON:  Chest x-ray dated 05/13/2014. FINDINGS: The heart size and mediastinal contours are within normal limits. Aortic atherosclerosis noted. Both lungs are clear. Mild degenerative spurring within the slightly kyphotic thoracic spine. No acute or suspicious osseous finding. IMPRESSION: No active cardiopulmonary disease. No evidence of pneumonia or pulmonary edema. Electronically Signed   By: Franki Cabot M.D.   On: 06/26/2017 21:36   Ct Abdomen Pelvis W Contrast  Result Date: 07/14/2017 CLINICAL DATA:  Patient  recently admitted for pancreatitis. Recurrent epigastric pain. EXAM: CT ABDOMEN AND PELVIS WITH CONTRAST TECHNIQUE: Multidetector CT imaging of the abdomen and pelvis was performed using the standard protocol following bolus administration of intravenous contrast. CONTRAST:  179m OMNIPAQUE IOHEXOL 300 MG/ML  SOLN COMPARISON:  Jun 27, 2017 FINDINGS: Lower chest: No acute abnormality. Hepatobiliary: Hepatic steatosis. The liver, portal vein, and gallbladder are otherwise normal. Pancreas: There is a fluid collection in the head of the pancreas on series 3, image 35 measuring 18 by 10 mm, not seen in December 2014 and larger since May of 2019. This is probably sequela of the recent pancreatitis given the short interval. The fluid collection between the pancreatic neck and left hepatic lobe is higher in attenuation and smaller measuring 13 mm today versus 18 mm previously. There are calcifications in the pancreatic head consistent with chronic pancreatitis. The pancreatic tail and most of the body are normal. There is increased attenuation in the fat of the mesentery adjacent to the pancreas. There is a developing collection on series 3, image 34 measuring 4.3 x 2.4 cm. The attenuation is mixed but probably contains at least some thick fluid. There is a high attenuation focal region in the anterior mesentery on series 3, image 36 measuring 3.1 x 4.3 cm with an attenuation of 25 Hounsfield units. The fluid adjacent to the duodenum and pancreatic head on the previous study has resolved. The patient's lipase is normal today. Spleen: Normal in size without focal abnormality. Adrenals/Urinary Tract: Adrenal glands are normal. A lesion off the upper pole the right kidney on series 3, image 28 measures 13 mm today and 12 mm on the previous study, unchanged given difference in slice selection. The attenuation is 46 Hounsfield units. Several tiny cysts are seen in both kidneys, some too small to characterize. At least 1 appears  hyperdense on the left. No hydronephrosis. No ureterectasis or ureteral stones. The bladder is normal. Stomach/Bowel: The stomach is normal. There may be some mild persistent inflammation of the distal second portion of the duodenum. The small bowel is otherwise normal. The colon is normal. Previous appendectomy. Vascular/Lymphatic: Atherosclerotic changes are seen in the nonaneurysmal aorta. No adenopathy. Reproductive: Uterus and bilateral adnexa are unremarkable. Other: No abdominal wall hernia or abnormality. No abdominopelvic ascites. Musculoskeletal: No acute or significant osseous findings. IMPRESSION: 1. The developing masslike regions in the mesentery anterior and inferior to the pancreas are likely sequela of recent pancreatitis. They demonstrate mixed attenuation and I  suspect they represent developing thick fluid collections. There is some mild surrounding stranding remaining. 2. There is a 18 x 10 mm cyst in the pancreatic head which is larger in the interval, likely sequela of recent pancreatitis. 3. A 13 mm mass off the upper pole the right kidney is likely a hyperdense cyst but nonspecific. Recommend correlation with ultrasound for confirmation of its cystic nature. 4. Probable mild persistent inflammation of the distal second portion of the duodenum from recent pancreatitis. 5. Atherosclerosis in the aorta. Electronically Signed   By: Dorise Bullion III M.D   On: 07/14/2017 20:05   Ct Abdomen Pelvis W Contrast  Result Date: 06/27/2017 CLINICAL DATA:  62 y/o F; abdominal pain with history of pancreatitis. EXAM: CT ABDOMEN AND PELVIS WITH CONTRAST TECHNIQUE: Multidetector CT imaging of the abdomen and pelvis was performed using the standard protocol following bolus administration of intravenous contrast. CONTRAST:  63m OMNIPAQUE IOHEXOL 300 MG/ML  SOLN COMPARISON:  01/28/2013 CT abdomen and pelvis. FINDINGS: Lower chest: Severe coronary artery calcifications. Hepatobiliary: No focal liver  abnormality is seen. No gallstones, gallbladder wall thickening, or biliary dilatation. Pancreas: Extensive edema surrounding the pancreatic head, first and second segments of the duodenum, and within the mesentery compatible with acute pancreatitis. Multilobulated cyst between the left lobe of the liver and the pancreas measuring up to 19 mm. Calcification within the pancreas compatible sequelae of chronic pancreatitis. Spleen: Normal in size without focal abnormality. Adrenals/Urinary Tract: Normal adrenal glands. Multiple cysts in the kidneys measuring up to 12 mm in the right upper pole. No hydronephrosis. Normal bladder. Stomach/Bowel: Stomach is within normal limits. Appendix appears normal. No evidence of bowel wall thickening, distention, or inflammatory changes. Mild sigmoid diverticulosis, no findings of acute diverticulitis. Vascular/Lymphatic: Aortic atherosclerosis. No enlarged abdominal or pelvic lymph nodes. Reproductive: Uterus and bilateral adnexa are unremarkable. Other: No abdominal wall hernia or abnormality. No abdominopelvic ascites. Musculoskeletal: No fracture is seen. IMPRESSION: 1. Acute on chronic pancreatitis. No findings of necrosis. 18 mm peripancreatic collection between the liver and head of pancreas. 2. Mild sigmoid diverticulosis, no findings of acute diverticulitis. 3. Aortic atherosclerosis. Electronically Signed   By: LKristine GarbeM.D.   On: 06/27/2017 05:16   UK[place]Abdomen Limited Ruq  Result Date: 06/27/2017 CLINICAL DATA:  62y/o  F; epigastric abdominal pain. EXAM: ULTRASOUND ABDOMEN LIMITED RIGHT UPPER QUADRANT COMPARISON:  03/26/2009 CT abdomen and pelvis. FINDINGS: Gallbladder: No gallstones or wall thickening visualized. No sonographic Murphy sign noted by sonographer. Common bile duct: Diameter: 5.9 mm Liver: No focal lesion identified. Within normal limits in parenchymal echogenicity. Portal vein is patent on color Doppler imaging with normal direction of  blood flow towards the liver. IMPRESSION: No acute process identified. Unremarkable right upper quadrant ultrasound. Electronically Signed   By: LKristine GarbeM.D.   On: 06/27/2017 04:38       Subjective: No significant abdominal pain.  Pancreatic enzymes helping   Discharge Exam: Vitals:   07/18/17 2025 07/19/17 0449  BP:  107/75  Pulse:  (!) 59  Resp:  16  Temp:  97.6 F (36.4 C)  SpO2: 96% 100%   Vitals:   07/18/17 0929 07/18/17 1412 07/18/17 2025 07/19/17 0449  BP: 126/63 (!) 112/91  107/75  Pulse: 85 78  (!) 59  Resp:  16  16  Temp:  97.8 F (36.6 C)  97.6 F (36.4 C)  TempSrc:  Oral  Oral  SpO2:  94% 96% 100%    General: Pt is alert,  awake, not in acute distress Cardiovascular: RRR, S1/S2 +, no rubs, no gallops Respiratory: CTA bilaterally, no wheezing, no rhonchi Abdominal: Soft, NT, ND, bowel sounds + Extremities: no edema, no cyanosis    The results of significant diagnostics from this hospitalization (including imaging, microbiology, ancillary and laboratory) are listed below for reference.     Microbiology: Recent Results (from the past 240 hour(s))  Urine Culture     Status: Abnormal   Collection Time: 07/14/17 11:21 AM  Result Value Ref Range Status   Specimen Description URINE, CLEAN CATCH  Final   Special Requests   Final    NONE Performed at [facility], [address], [place], [place] [address]    Culture >=100,000 COLONIES/mL KLEBSIELLA PNEUMONIAE (A)  Final   Report Status 07/16/2017 FINAL  Final   Organism ID, Bacteria KLEBSIELLA PNEUMONIAE (A)  Final      Susceptibility   Klebsiella pneumoniae - MIC*    AMPICILLIN >=32 RESISTANT Resistant     CEFAZOLIN <=4 SENSITIVE Sensitive     CEFTRIAXONE <=1 SENSITIVE Sensitive     CIPROFLOXACIN <=0.25 SENSITIVE Sensitive     GENTAMICIN <=1 SENSITIVE Sensitive     IMIPENEM <=0.25 SENSITIVE Sensitive     NITROFURANTOIN 64 INTERMEDIATE Intermediate     TRIMETH/SULFA <=20  SENSITIVE Sensitive     AMPICILLIN/SULBACTAM 4 SENSITIVE Sensitive     PIP/TAZO <=4 SENSITIVE Sensitive     Extended ESBL NEGATIVE Sensitive     * >=100,000 COLONIES/mL KLEBSIELLA PNEUMONIAE     Labs: BNP (last 3 results) No results for input(s): BNP in the last 8760 hours. Basic Metabolic Panel: Recent Labs  Lab 07/14/17 1116 07/15/17 0813 07/16/17 0345 07/18/17 1432 07/19/17 0416  NA 137 141 140 142 142  K 4.4 4.0 4.2 3.9 4.0  CL 98* 106 107 107 115*  CO2 26 25 24 27 24   GLUCOSE 140* 95 139* 177* 130*  BUN 29* 18 10 7 6   CREATININE 1.64* 1.17* 1.09* 1.13* 1.05*  CALCIUM 10.1 9.2 8.9 9.0 8.8*  MG  --   --   --  1.6* 1.6*   Liver Function Tests: Recent Labs  Lab 07/14/17 1116 07/15/17 0813 07/16/17 0345 07/19/17 0416  AST 21 20 20 19   ALT 16 16 16 18   ALKPHOS 70 57 52 60  BILITOT 0.7 0.5 0.2* 0.3  PROT 7.8 6.1* 5.8* 5.7*  ALBUMIN 3.8 3.2* 3.1* 2.9*   Recent Labs  Lab 07/14/17 1116 07/16/17 0345  LIPASE 43  --   AMYLASE  --  85   No results for input(s): AMMONIA in the last 168 hours. CBC: Recent Labs  Lab 07/14/17 1116 07/15/17 0813 07/16/17 0345 07/18/17 1432 07/19/17 0416  WBC 6.9 5.0 5.5 3.7* 3.2*  HGB 11.6* 10.5* 9.9* 9.6* 9.6*  HCT 37.8 34.3* 32.7* 32.5* 32.0*  MCV 88.5 91.0 90.6 91.5 90.9  PLT 440* 349 351 282 279   Cardiac Enzymes: No results for input(s): CKTOTAL, CKMB, CKMBINDEX, TROPONINI in the last 168 hours. BNP: Invalid input(s): POCBNP CBG: Recent Labs  Lab 07/18/17 2050 07/18/17 2359 07/19/17 0437 07/19/17 0752 07/19/17 1142  GLUCAP 119* 99 108* 102* 158*   D-Dimer No results for input(s): DDIMER in the last 72 hours. Hgb A1c No results for input(s): HGBA1C in the last 72 hours. Lipid Profile No results for input(s): CHOL, HDL, LDLCALC, TRIG, CHOLHDL, LDLDIRECT in the last 72 hours. Thyroid function studies No results for input(s): TSH, T4TOTAL, T3FREE, THYROIDAB in the last  72 hours.  Invalid input(s):  FREET3 Anemia work up No results for input(s): VITAMINB12, FOLATE, FERRITIN, TIBC, IRON, RETICCTPCT in the last 72 hours. Urinalysis    Component Value Date/Time   COLORURINE AMBER (A) 07/14/2017 1100   APPEARANCEUR CLOUDY (A) 07/14/2017 1100   LABSPEC 1.025 07/14/2017 1100   PHURINE 5.0 07/14/2017 1100   GLUCOSEU NEGATIVE 07/14/2017 1100   HGBUR SMALL (A) 07/14/2017 1100   BILIRUBINUR MODERATE (A) 07/14/2017 1100   KETONESUR 5 (A) 07/14/2017 1100   PROTEINUR 100 (A) 07/14/2017 1100   UROBILINOGEN 0.2 05/13/2014 1250   NITRITE NEGATIVE 07/14/2017 1100   LEUKOCYTESUR MODERATE (A) 07/14/2017 1100   Sepsis Labs Invalid input(s): PROCALCITONIN,  WBC,  LACTICIDVEN Microbiology Recent Results (from the past 240 hour(s))  Urine Culture     Status: Abnormal   Collection Time: 07/14/17 11:21 AM  Result Value Ref Range Status   Specimen Description URINE, CLEAN CATCH  Final   Special Requests   Final    NONE Performed at [facility], [place] [address], [place], [place] [address]    Culture >=100,000 COLONIES/mL KLEBSIELLA PNEUMONIAE (A)  Final   Report Status 07/16/2017 FINAL  Final   Organism ID, Bacteria KLEBSIELLA PNEUMONIAE (A)  Final      Susceptibility   Klebsiella pneumoniae - MIC*    AMPICILLIN >=32 RESISTANT Resistant     CEFAZOLIN <=4 SENSITIVE Sensitive     CEFTRIAXONE <=1 SENSITIVE Sensitive     CIPROFLOXACIN <=0.25 SENSITIVE Sensitive     GENTAMICIN <=1 SENSITIVE Sensitive     IMIPENEM <=0.25 SENSITIVE Sensitive     NITROFURANTOIN 64 INTERMEDIATE Intermediate     TRIMETH/SULFA <=20 SENSITIVE Sensitive     AMPICILLIN/SULBACTAM 4 SENSITIVE Sensitive     PIP/TAZO <=4 SENSITIVE Sensitive     Extended ESBL NEGATIVE Sensitive     * >=100,000 COLONIES/mL KLEBSIELLA PNEUMONIAE     Time coordinating discharge: 35 minutes.  SIGNED:   Elmarie Shiley, MD  [facility] 07/19/2017, 12:12 PM Pager [phone]  If 7PM-7AM, please contact  night-coverage [email or web address] Password [email or web address]

## 2017-07-19 NOTE — Progress Notes (Signed)
Rn gave patient discharge instructions, Pt stated understanding IV has been removed pt comfortable and getting dressed

## 2017-07-19 NOTE — Progress Notes (Signed)
          Daily Rounding Note  07/19/2017, 10:27 AM  LOS: 4 days   SUBJECTIVE:   Chief complaint: Upper abdominal pain improved though some lingering symptoms.  She does not necessarily have to use analgesics.  Tolerating low-fat diet, no nausea.  Appetite improving.  Feels ready to go home. Her only concern is whether or not her drug benefits program will cover the cost of pancreatic enzyme replacement.  Apparently this needs to be ordered through her PMD, Dr. Lynnea FerrierWarren Pickard.  OBJECTIVE:         Vital signs in last 24 hours:    Temp:  [97.6 F (36.4 C)-97.8 F (36.6 C)] 97.6 F (36.4 C) (06/05 0449) Pulse Rate:  [59-78] 59 (06/05 0449) Resp:  [16] 16 (06/05 0449) BP: (107-112)/(75-91) 107/75 (06/05 0449) SpO2:  [94 %-100 %] 100 % (06/05 0449) Last BM Date: 07/18/17 There were no vitals filed for this visit. General: Looks better.  Comfortable.  Does not look ill. Heart: RRR. Chest: Clear bilaterally.  No coughing. Abdomen: Soft.  Nontender.  No distention.  Active bowel sounds. Extremities: No CCE. Neuro/Psych: Calm, alert and fully oriented x3.  No deficits.  Intake/Output from previous day: 06/04 0701 - 06/05 0700 In: 720 [P.O.:720] Out: -   Intake/Output this shift: No intake/output data recorded.  Lab Results: Recent Labs    07/18/17 1432 07/19/17 0416  WBC 3.7* 3.2*  HGB 9.6* 9.6*  HCT 32.5* 32.0*  PLT 282 279   BMET Recent Labs    07/18/17 1432 07/19/17 0416  NA 142 142  K 3.9 4.0  CL 107 115*  CO2 27 24  GLUCOSE 177* 130*  BUN 7 6  CREATININE 1.13* 1.05*  CALCIUM 9.0 8.8*   LFT Recent Labs    07/19/17 0416  PROT 5.7*  ALBUMIN 2.9*  AST 19  ALT 18  ALKPHOS 60  BILITOT 0.3   Scheduled Meds: . aspirin EC  81 mg Oral Daily  . bisoprolol  2.5 mg Oral Daily  . clopidogrel  75 mg Oral Daily  . enoxaparin (LOVENOX) injection  40 mg Subcutaneous Q24H  . insulin aspart  0-9 Units Subcutaneous  Q4H  . lipase/protease/amylase  72,000 Units Oral TID AC  . mometasone-formoterol  2 puff Inhalation BID  . multivitamin with minerals  1 tablet Oral Daily  . pantoprazole  40 mg Oral Daily  . rosuvastatin  40 mg Oral Daily   Continuous Infusions: . 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  Symptoms of pain, nausea, anorexia, early satiety have improved.  Creon added 6/3.      *   Chronic Plavix.  Unstentable CAD.  ASPVD.     PLAN   *   Okay for discharge from GI standpoint.  *    Has ROV with Dr Myrtie Neitheranis set for 7/12, details in provider navigator.   Continue Protonix 40 mg daily, Creon 72,000 units or equivalent AC.      Jennye MoccasinSarah Krizia Flight  07/19/2017, 10:27 AM Phone [phone]

## 2017-07-20 ENCOUNTER — Ambulatory Visit: Payer: BLUE CROSS/BLUE SHIELD | Admitting: Family Medicine

## 2017-07-20 ENCOUNTER — Telehealth: Payer: Self-pay | Admitting: Family Medicine

## 2017-07-20 MED ORDER — HYDROCODONE-ACETAMINOPHEN 5-325 MG PO TABS: 1.0000 | ORAL_TABLET | Freq: Four times a day (QID) | ORAL | 0 refills | Status: DC

## 2017-07-20 NOTE — Telephone Encounter (Signed)
PA Submitted through [email or web address] and received the following:  Your information has been submitted to [employer] [place]. [employer] [place] will review the request and fax you a determination directly, typically within 3 business days of your submission once all necessary information is received.  If [employer] [place] has not responded in 3 business days or if you have any questions about your submission, contact [employer] [place] at [phone]. 

## 2017-07-20 NOTE — Telephone Encounter (Signed)
Patient is requesting a refill on Hydrocodone   LOV: 07/14/17  LRF:   06/20/17

## 2017-07-21 ENCOUNTER — Other Ambulatory Visit: Payer: Self-pay | Admitting: Family Medicine

## 2017-07-21 NOTE — Telephone Encounter (Signed)
Ok to refill 

## 2017-07-21 NOTE — Telephone Encounter (Signed)
[facility] and they will send a note to the pharmacy to try and review asap but she could not make it an expedited request.

## 2017-07-24 MED ORDER — PANCRELIPASE (LIP-PROT-AMYL) 36000-114000 UNITS PO CPEP: 72000.0000 [IU] | ORAL_CAPSULE | Freq: Three times a day (TID) | ORAL | 1 refills | Status: DC

## 2017-07-24 NOTE — Telephone Encounter (Signed)
Approvedon June 7  Effective from 07/20/2017 through 07/18/2020. Pharmacy made aware.

## 2017-08-03 ENCOUNTER — Encounter: Payer: Self-pay | Admitting: Podiatry

## 2017-08-03 ENCOUNTER — Ambulatory Visit: Payer: BLUE CROSS/BLUE SHIELD | Admitting: Podiatry

## 2017-08-03 DIAGNOSIS — M722 Plantar fascial fibromatosis: Secondary | ICD-10-CM

## 2017-08-03 MED ORDER — TRIAMCINOLONE ACETONIDE 10 MG/ML IJ SUSP
10.0000 mg | Freq: Once | INTRAMUSCULAR | Status: AC
  Administered 2017-08-03: 10 mg

## 2017-08-03 NOTE — Progress Notes (Signed)
Subjective:   Patient ID: [name], female   DOB: 62 y.o.   MRN: [id number]   HPI Patient states her heel is been very sore but she cannot do surgery now as she has had pancreatitis and she is dealing with that   ROS      Objective:  Physical Exam  Neurovascular status intact with inflammation of the plantar heel mostly on the center center lateral side of the heel is very tender when pressed with pain worse when she gets up in the morning and after periods of sitting     Assessment:  Acute plantar fasciitis left that we have not been able to do surgery on due to a bout of pancreatitis     Plan:  H&P condition reviewed and careful lateral injection administered 3 mg Kenalog 5 mg Xylocaine and dispensed a night splint with all instructions on usage

## 2017-08-15 ENCOUNTER — Other Ambulatory Visit: Payer: Self-pay | Admitting: Family Medicine

## 2017-08-15 MED ORDER — HYDROCODONE-ACETAMINOPHEN 5-325 MG PO TABS: 1.0000 | ORAL_TABLET | Freq: Four times a day (QID) | ORAL | 0 refills | Status: DC

## 2017-08-15 NOTE — Telephone Encounter (Signed)
Patient would like refill on her hydrocodone  [facility]  [phone]

## 2017-08-15 NOTE — Telephone Encounter (Signed)
Patient is requesting a refill on Hydrocodone   LOV: 07/14/17  LRF:   07/20/17

## 2017-08-18 ENCOUNTER — Other Ambulatory Visit: Payer: Self-pay | Admitting: Family Medicine

## 2017-08-22 ENCOUNTER — Encounter: Payer: Self-pay | Admitting: Family Medicine

## 2017-08-22 ENCOUNTER — Ambulatory Visit: Payer: BLUE CROSS/BLUE SHIELD | Admitting: Family Medicine

## 2017-08-22 VITALS — BP 130/78 | HR 94 | Temp 97.8°F | Resp 16 | Ht 62.5 in | Wt 178.0 lb

## 2017-08-22 DIAGNOSIS — E1165 Type 2 diabetes mellitus with hyperglycemia: Secondary | ICD-10-CM

## 2017-08-22 DIAGNOSIS — E118 Type 2 diabetes mellitus with unspecified complications: Secondary | ICD-10-CM | POA: Diagnosis not present

## 2017-08-22 DIAGNOSIS — IMO0002 Reserved for concepts with insufficient information to code with codable children: Secondary | ICD-10-CM

## 2017-08-22 NOTE — Progress Notes (Signed)
Subjective:    Patient ID: [name], female    DOB: 11/12/1955, 62 y.o.   MRN: [id number]  HPI  Patient was recently discharged from the hospital after being treated for chronic pancreatitis.  I have copied relevant portions of the discharge summary and included them below for my reference:  Admit date: 06/26/2017 Discharge date: 06/30/2017  Admitted From home Disposition: home  Recommendations for Outpatient Follow-up:  1. Follow up with PCP in 1-2 weeks 2. Please obtain BMP/CBC in one week  Home Health none Equipment/Devices: None Discharge Condition: Stable  CODE STATUS full Diet recommendation carb modified Brief/Interim Summary::62 y.o.Fwith COPD not on home O2, CAD ,DM, HTN, and chronic pancreatitis who presents with epigastric pain   Discharge Diagnoses:  Principal Problem:   Acute on chronic pancreatitis ([place]) Active Problems:   TOBACCO ABUSE   Essential hypertension   PAD (peripheral artery disease) (HCC)   GERD (gastroesophageal reflux disease)   Renal artery stenosis (HCC)   CAD (coronary artery disease)   Diabetes mellitus with circulatory complication (HCC)  Acute on chronic pancreatitis-patient was treated with n.p.o. IV hydration and pain control.  Her symptoms improved with the above treatment.  He was started on clear liquid diet diet was advanced to regular diet she was able to tolerate the regular diet on the day of discharge.   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 This was discussed with Radiology who felt the significance was unclear, but that: -Pancreatic protocol CT in 3-6 months to reimage would not be unreasonable in this patient with risk factors (age, smoking, chronic pancreatitis).  Coronary disease Hypertension -Hold home lisinopril, BB, HCTZ -Continue Imdur -Resume aspirin, Plavix, statin  Diabetes -Hold metformin and glimepiride and  pioglitazone -SSI with meals  Tobacco use Patient states she has been off tobacco for 8 years  COPD  No active disease. -Continue Advair as a formulary alternative Dulera  07/07/17 Patient is here today for follow-up.  She states that she still has some mid abdominal tenderness.  She is able to eat and keep down solid food however she is experiencing early satiety.  She will eat 2 or 3 bites and feel completely full with no desire to eat more.  She is drinking very little.  She denies any fevers or chills.  She denies any chest pain.  She denies any trouble breathing.  She denies any nausea or vomiting.  She denies any melena or hematochezia.  Review of her lab work from the hospital shows a drop in her hemoglobin from 12-10 which was likely due to dehydration and aggressive IV fluid replacement.  This is due to be followed up.  She states that her blood sugars are 130s in the morning fasting.  2-hour postprandial sugars are under 200.  At that time, my plan was: Patient seems to be improving.  I have recommended rechecking her in 3 to 4 weeks to ensure complete and total resolution and that she returns back to her baseline.  I would repeat a CT scan of the abdomen and pelvis in 3 months to ensure resolution of the peripancreatic cyst/fluid collection seen on CT.  I reviewed the CT findings with the patient and showed her the fluid collection on CT and explained the necessity for follow-up to rule out underlying pancreatic malignancy.  Hemoglobin A1c was found to be 6.7 prior to hospitalization and well-controlled.  I will repeat a CBC to ensure that the  drop in hemoglobin was simply due to rehydration and that there is no persistent drop.  I will also repeat a CMP to monitor her renal function and potassium given the fact she is taking very little oral intake due to early satiety.  I encouraged 3 to 4 glasses of water a day and gradually increasing her diet as tolerated.  Recheck sooner if  worsening  07/14/17 Patient's pain is worsening.  She rates her pain at a 9 to a 10 on a scale of 10.  She states that the pain is a bandlike sensation around her epigastric area that radiates directly into her back.  She is only eating clear liquids and broth and is unable to tolerate any oral intake due to nausea.  As soon as she eats she developed severe pain.  She denies any fevers or chills.  She denies any diarrhea.  She denies any hematemesis, hematochezia, melena.  She denies any fevers.  She denies any shortness of breath or cough.  She is using her pain medication 2 tablets every 4-6 hours without any relief.  At that time, my plan was: Patient appears to have acute on chronic pancreatitis.  She is on clear liquids and still unable to control her pain.  Therefore I do not see how we can manage this as an outpatient without making her n.p.o. and allowing complete and total bowel rest.  She is unable to do this at home due to dehydration and nutrition requirements.  Therefore I believe she needs to be admitted to the hospital for IV fluid and possibly parenteral nutrition until resolution of the pancreatitis.  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  Therefore I recommended that she go to the emergency room for evaluation and treatment.  08/22/17 Patient was readmitted and a follow-up CT was obtained of the abdomen and pelvis on May 31 in the hospital.  I have copied relevant portions of the CT scan below: IMPRESSION: 1. The developing masslike regions in the mesentery anterior and inferior to the pancreas are likely sequela of recent pancreatitis. They demonstrate mixed attenuation and I suspect they represent developing thick fluid collections. There is some mild surrounding stranding remaining. 2. There is a 18 x 10 mm cyst in the pancreatic head which is larger in the interval, likely sequela of recent  pancreatitis. 3. A 13 mm mass off the upper pole the right kidney is likely a hyperdense cyst but nonspecific. Recommend correlation with ultrasound for confirmation of its cystic nature. 4. Probable mild persistent inflammation of the distal second portion of the duodenum from recent pancreatitis. 5. Atherosclerosis in the aorta.  Gastroenterology was consulted in the hospital and the patient was started on pancreatic enzymes, Creon, 36,000 units/tab., 2 tablets with meals.  She states that this is helped substantially.  It has improved her pain.  She denies any diarrhea, abdominal bloating, vomiting.  Although if she eats a fatty meal, she still has a lot of nausea and pain.  She is also having to take her pain medicine on a scheduled basis 3-4 times a day to help manage the pain and eating food certainly exacerbates it however the pain is now manageable.  Scheduled to see the gastroenterologist tomorrow.  During her most recent hospitalization, she developed profound anemia with her hemoglobin reaching a nadir of 9.6.  She is currently on iron supplementation and is due to recheck her hemoglobin.  Due to her poor p.o. intake, her pioglitazone and glimepiride were discontinued in the hospital.  She states that her blood sugars are now greater than 180.  She is only on metformin. Past Medical History:  Diagnosis Date  . Asthma with COPD ([place])    PATIENT DENIES  . CAD (coronary artery disease)    a. [place] 2011: Diffuse distal and branch vessel disease - patient managed medically, no interventional options. b. [place] 03/2014 - low risk, no ischemia.  . Complication of anesthesia    slow to wake up with last surgery in 2011   . Diabetes mellitus with circulatory complication ([place])   . Dyslipidemia   . Headache    hx of migraines   . Hypertension   . Osteoarthritis    severe right knee  R TKR  . PAD (peripheral artery disease) ([place])    a. Severe stenosis mid right SFA s/p atherectomy 03/25/09. b.  peripheral angiography in 12/2011 which showed only about 50% diffuse right SFA stenosis.  . Pancreatitis 2003  . Renal artery stenosis ([place])    a. [place] 2011 - 40-50% left RAS.  . Tobacco use disorder    quit 11/10   Past Surgical History:  Procedure Laterality Date  . ABDOMINAL AORTAGRAM N/A 12/21/2011   Procedure: ABDOMINAL Maxcine Ham;  Surgeon: Wellington Hampshire, MD;  Location: [facility];  Service: Cardiovascular;  Laterality: N/A;  . CARDIAC CATHETERIZATION    . LOWER EXTREMITY ANGIOGRAM Bilateral 05/27/2015   Procedure: Lower Extremity Angiogram;  Surgeon: Wellington Hampshire, MD;  Location: [facility];  Service: Cardiovascular;  Laterality: Bilateral;  . PERIPHERAL VASCULAR CATHETERIZATION N/A 05/27/2015   Procedure: Abdominal Aortogram;  Surgeon: Wellington Hampshire, MD;  Location: [facility];  Service: Cardiovascular;  Laterality: N/A;  . PERIPHERAL VASCULAR CATHETERIZATION Right 05/27/2015   Procedure: Peripheral Vascular Intervention;  Surgeon: Wellington Hampshire, MD;  Location: [facility];  Service: Cardiovascular;  Laterality: Right;  SFA  . TOTAL KNEE ARTHROPLASTY Right 2011  . TOTAL KNEE ARTHROPLASTY Left 05/22/2014   Procedure: LEFT TOTAL KNEE ARTHROPLASTY;  Surgeon: Latanya Maudlin, MD;  Location: [facility];  Service: Orthopedics;  Laterality: Left;  . TUBAL LIGATION  1980   Current Outpatient Medications on File Prior to Visit  Medication Sig Dispense Refill  . ADVAIR DISKUS 250-50 MCG/DOSE AEPB INHALE 1 PUFF INTO THE LUNGS EVERY 12 HOURS 60 each 0  . aspirin 81 MG tablet Take 81 mg by mouth daily.      . Biotin w/ Vitamins C & E (HAIR/SKIN/NAILS PO) Take 1 tablet by mouth daily.    . Blood Glucose Monitoring Suppl (BAYER CONTOUR MONITOR) w/Device KIT CHECK BLOOD SUGAR EVERY DAY 1 kit 0  . clopidogrel (PLAVIX) 75 MG tablet Take 1 tablet (75 mg total) by mouth daily. 30 tablet 11  . CONTOUR NEXT TEST test strip CHECK BLOOD SUGAR EVERY DAY 100 each 5  . diclofenac  sodium (VOLTAREN) 1 % GEL Apply 2 g topically 4 (four) times daily. (Patient taking differently: Apply 2 g topically 4 (four) times daily as needed (pain). ) 100 g 0  . ferrous sulfate 325 (65 FE) MG tablet Take 325 mg by mouth daily with breakfast.    . HYDROcodone-acetaminophen (NORCO/VICODIN) 5-325 MG tablet Take 1 tablet by mouth 4 (four) times daily. 120 tablet 0  . isosorbide mononitrate (IMDUR) 60 MG 24 hr tablet TAKE 1 TABLET BY MOUTH DAILY 90 tablet 2  . lipase/protease/amylase (CREON)  36000 UNITS CPEP capsule Take 2 capsules (72,000 Units total) by mouth 3 (three) times daily before meals. 180 capsule 1  . metFORMIN (GLUCOPHAGE) 1000 MG tablet TAKE 1 TABLET BY MOUTH TWICE DAILY WITH MEALS 180 tablet 0  . methocarbamol (ROBAXIN) 500 MG tablet TAKE 1 TABLET BY MOUTH EVERY 6 HOURS AS NEEDED FOR MUSCLE SPASMS 120 tablet 0  . Multiple Vitamin (MULTIVITAMIN) tablet Take 1 tablet by mouth daily.      . ondansetron (ZOFRAN) 4 MG tablet TAKE 1 TABLET(4 MG) BY MOUTH EVERY 8 HOURS AS NEEDED FOR NAUSEA OR VOMITING 30 tablet 0  . pantoprazole (PROTONIX) 40 MG tablet TAKE 1 TABLET BY MOUTH DAILY 90 tablet 3  . Probiotic Product (TRUBIOTICS PO) Take 1 capsule by mouth daily.    . rosuvastatin (CRESTOR) 40 MG tablet TAKE 1 TABLET BY MOUTH DAILY 90 tablet 1  . VENTOLIN HFA 108 (90 Base) MCG/ACT inhaler INHALE 2 PUFFS BY MOUTH TWICE DAILY AS NEEDED FOR WHEEZING OR SHORTNESS OF BREATH 54 g 3   No current facility-administered medications on file prior to visit.    Allergies  Allergen Reactions  . Gabapentin Other (See Comments)    Brain shakes  . Glucotrol [Glipizide] Rash and Other (See Comments)    Red rash  . Influenza Vaccines Other (See Comments)    Significant arm soreness requiring 1 year of physical therapy  . Latex Rash  . Nortriptyline Other (See Comments)    BRAIN SHAKE  . Tetanus Toxoid Hives, Swelling, Rash and Other (See Comments)    Swelling to site of injection with rash and fever   . Invokana [Canagliflozin] Other (See Comments)    Yeast Infections   Social History   Socioeconomic History  . Marital status: Married    Spouse name: Not on file  . Number of children: Not on file  . Years of education: 43  . Highest education level: Not on file  Occupational History  . Not on file  Social Needs  . Financial resource strain: Not on file  . Food insecurity:    Worry: Not on file    Inability: Not on file  . Transportation needs:    Medical: Not on file    Non-medical: Not on file  Tobacco Use  . Smoking status: Former Smoker    Last attempt to quit: 12/13/2008    Years since quitting: 8.6  . Smokeless tobacco: Never Used  Substance and Sexual Activity  . Alcohol use: No    Comment: Quit drinking around year 2000  . Drug use: No    Comment: Patient quit smoking around 2011  . Sexual activity: Not on file  Lifestyle  . Physical activity:    Days per week: Not on file    Minutes per session: Not on file  . Stress: Not on file  Relationships  . Social connections:    Talks on phone: Not on file    Gets together: Not on file    Attends religious service: Not on file    Active member of club or organization: Not on file    Attends meetings of clubs or organizations: Not on file    Relationship status: Not on file  . Intimate partner violence:    Fear of current or ex partner: Not on file    Emotionally abused: Not on file    Physically abused: Not on file    Forced sexual activity: Not on file  Other Topics Concern  .  Not on file  Social History Narrative   Denies any IV drug use or marijuana use.  Former smoker for 30 years, quit Nov. 2010. She is married with 2 children. Disabled from knee, not working.     Review of Systems  All other systems reviewed and are negative.      Objective:   Physical Exam  Constitutional: She appears well-developed and well-nourished. No distress.  Neck: Normal range of motion. Neck supple. No JVD present.  No thyromegaly present.  Cardiovascular: Normal rate, regular rhythm and normal heart sounds. Exam reveals no gallop and no friction rub.  No murmur heard. Pulmonary/Chest: Effort normal and breath sounds normal. No stridor. No respiratory distress. She has no wheezes. She has no rales.  Abdominal: Soft. Bowel sounds are normal. She exhibits no distension and no mass. There is no tenderness. There is no rebound and no guarding. No hernia.  Lymphadenopathy:    She has no cervical adenopathy.  Skin: She is not diaphoretic.  Vitals reviewed.         Assessment & Plan:  Chronic abdominal pain and pancreatic insufficiency secondary to chronic pancreatitis-I would increase Creon to 3 capsules with each meal.  She can continue hydrocodone 5/325 1 tablet 4 times a day as planned.  We may need to increase the medication due to habituation and dependency to better control her pain in the future  Regarding her anemia, I will recheck a CBC today.  She is on iron replacement.  If her hemoglobin is not improving, we may need to work-up her anemia further including FIT testing and obtaining an anemia panel and considering IV iron replacement if necessary.  Regarding her diabetes, I will repeat a hemoglobin A1c and if worsening, I will likely resume Actos 30 mg a day to avoid increased risk of pancreatitis by using a sulfonylurea, a DPP 4 inhibitor, or victoza.

## 2017-08-23 ENCOUNTER — Ambulatory Visit: Payer: BLUE CROSS/BLUE SHIELD | Admitting: Gastroenterology

## 2017-08-23 ENCOUNTER — Encounter: Payer: Self-pay | Admitting: Gastroenterology

## 2017-08-23 ENCOUNTER — Other Ambulatory Visit: Payer: Self-pay | Admitting: Family Medicine

## 2017-08-23 VITALS — BP 132/70 | HR 96 | Ht 63.0 in | Wt 177.0 lb

## 2017-08-23 DIAGNOSIS — K861 Other chronic pancreatitis: Secondary | ICD-10-CM

## 2017-08-23 DIAGNOSIS — K860 Alcohol-induced chronic pancreatitis: Secondary | ICD-10-CM | POA: Diagnosis not present

## 2017-08-23 DIAGNOSIS — R1010 Upper abdominal pain, unspecified: Secondary | ICD-10-CM

## 2017-08-23 DIAGNOSIS — R110 Nausea: Secondary | ICD-10-CM | POA: Diagnosis not present

## 2017-08-23 LAB — CBC WITH DIFFERENTIAL/PLATELET
BASOS ABS: 59 {cells}/uL (ref 0–200)
Basophils Relative: 0.9 %
EOS ABS: 310 {cells}/uL (ref 15–500)
Eosinophils Relative: 4.7 %
HEMATOCRIT: 37.6 % (ref 35.0–45.0)
HEMOGLOBIN: 12.3 g/dL (ref 11.7–15.5)
LYMPHS ABS: 1505 {cells}/uL (ref 850–3900)
MCH: 27.8 pg (ref 27.0–33.0)
MCHC: 32.7 g/dL (ref 32.0–36.0)
MCV: 85.1 fL (ref 80.0–100.0)
MPV: 9.1 fL (ref 7.5–12.5)
Monocytes Relative: 7.3 %
NEUTROS ABS: 4244 {cells}/uL (ref 1500–7800)
Neutrophils Relative %: 64.3 %
Platelets: 354 10*3/uL (ref 140–400)
RBC: 4.42 10*6/uL (ref 3.80–5.10)
RDW: 14.3 % (ref 11.0–15.0)
Total Lymphocyte: 22.8 %
WBC mixed population: 482 cells/uL (ref 200–950)
WBC: 6.6 10*3/uL (ref 3.8–10.8)

## 2017-08-23 LAB — COMPLETE METABOLIC PANEL WITH GFR
AG RATIO: 1.8 (calc) (ref 1.0–2.5)
ALT: 14 U/L (ref 6–29)
AST: 14 U/L (ref 10–35)
Albumin: 4.3 g/dL (ref 3.6–5.1)
Alkaline phosphatase (APISO): 84 U/L (ref 33–130)
BILIRUBIN TOTAL: 0.3 mg/dL (ref 0.2–1.2)
BUN/Creatinine Ratio: 18 (calc) (ref 6–22)
BUN: 18 mg/dL (ref 7–25)
CALCIUM: 10.2 mg/dL (ref 8.6–10.4)
CHLORIDE: 103 mmol/L (ref 98–110)
CO2: 26 mmol/L (ref 20–32)
Creat: 1 mg/dL — ABNORMAL HIGH (ref 0.50–0.99)
GFR, EST AFRICAN AMERICAN: 71 mL/min/{1.73_m2} (ref >=60)
GFR, EST NON AFRICAN AMERICAN: 61 mL/min/{1.73_m2} (ref >=60)
GLOBULIN: 2.4 g/dL (ref 1.9–3.7)
Glucose, Bld: 140 mg/dL — ABNORMAL HIGH (ref 65–99)
Potassium: 4.2 mmol/L (ref 3.5–5.3)
SODIUM: 140 mmol/L (ref 135–146)
Total Protein: 6.7 g/dL (ref 6.1–8.1)

## 2017-08-23 LAB — HEMOGLOBIN A1C
Hgb A1c MFr Bld: 6.6 % of total Hgb — ABNORMAL HIGH (ref <5.7)
MEAN PLASMA GLUCOSE: 143 (calc)
eAG (mmol/L): 7.9 (calc)

## 2017-08-23 NOTE — Patient Instructions (Signed)
We will discuss your case with Dr Christella HartiganJacobs and contact you soon should you need additional testing.  If you are age 60 or older, your body mass index should be between 23-30. Your Body mass index is 31.35 kg/m. If this is out of the aforementioned range listed, please consider follow up with your Primary Care Provider.  If you are age 664 or younger, your body mass index should be between 19-25. Your Body mass index is 31.35 kg/m. If this is out of the aformentioned range listed, please consider follow up with your Primary Care Provider.   It was a pleasure to seeing you today!  Dr. Myrtie Neitheranis

## 2017-08-23 NOTE — Progress Notes (Signed)
[facility] Progress Note  Chief Complaint: Chronic pancreatitis  Subjective  History:  This is follow-up for a 62 year old woman I met during a recent hospitalization, when I was asked to consult for recurrent pancreatitis.  My consult note of 07/17/2017 details her case. She had a previous history of alcohol abuse with pancreatitis dating back to the early 2000's. She was admitted to [facility] mid May with acute on chronic pancreatitis, CT scan showing extensive peripancreatic and proximal duodenal inflammation.  She was readmitted at the time I saw her in consult, symptoms really had not improved over those previous few weeks.  Lipase was 43, LFTs normal.  Triglycerides normal at 177.  Repeat CT scan showed evolving fluid collections in the pancreas and adjacent mesentery.  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 She improved quickly with bowel rest and institution of pancreatic enzyme capsules as well as a low fat diet.  Safiyya reports that she is overall improved from when I last saw her.  She is taking 2 Creon capsules with each meal, but recently primary care increase that to 3 and wanted to try that for about 2 weeks.  She has chronic fatigue, and still has intermittent upper abdominal pain more days than not.  She has many years of nausea for which she takes medicine daily.  She denies vomiting or weight loss.  She has certainly noticed that heavy or fatty foods or portion that is too large will worsen her abdominal pain.  ROS: Cardiovascular:  no chest pain Respiratory: no dyspnea  The patient's Past Medical, Family and Social History were reviewed and are on file in the EMR. She quit smoking about 8 years ago, and quit alcohol about 20 years ago.  Objective:  Med list reviewed  Current Outpatient Medications:  .  ADVAIR DISKUS 250-50 MCG/DOSE AEPB, INHALE 1 PUFF INTO THE LUNGS EVERY 12 HOURS, Disp: 60 each, Rfl:  0 .  aspirin 81 MG tablet, Take 81 mg by mouth daily.  , Disp: , Rfl:  .  Biotin w/ Vitamins C & E (HAIR/SKIN/NAILS PO), Take 1 tablet by mouth daily., Disp: , Rfl:  .  bisoprolol (ZEBETA) 5 MG tablet, Take 5 mg by mouth daily. 1 / 2 tab QHS, Disp: , Rfl:  .  Blood Glucose Monitoring Suppl (BAYER CONTOUR MONITOR) w/Device KIT, CHECK BLOOD SUGAR EVERY DAY, Disp: 1 kit, Rfl: 0 .  clopidogrel (PLAVIX) 75 MG tablet, Take 1 tablet (75 mg total) by mouth daily., Disp: 30 tablet, Rfl: 11 .  CONTOUR NEXT TEST test strip, CHECK BLOOD SUGAR EVERY DAY, Disp: 100 each, Rfl: 5 .  diclofenac sodium (VOLTAREN) 1 % GEL, Apply 2 g topically 4 (four) times daily. (Patient taking differently: Apply 2 g topically 4 (four) times daily as needed (pain). ), Disp: 100 g, Rfl: 0 .  ferrous sulfate 325 (65 FE) MG tablet, Take 325 mg by mouth daily with breakfast., Disp: , Rfl:  .  HYDROcodone-acetaminophen (NORCO/VICODIN) 5-325 MG tablet, Take 1 tablet by mouth 4 (four) times daily., Disp: 120 tablet, Rfl: 0 .  isosorbide mononitrate (IMDUR) 60 MG 24 hr tablet, TAKE 1 TABLET BY MOUTH DAILY, Disp: 90 tablet, Rfl: 2 .  lipase/protease/amylase (CREON) 36000 UNITS CPEP capsule, Take 2 capsules (72,000 Units total) by mouth 3 (three) times daily before meals. (Patient taking differently: Take 72,000 Units by mouth 3 (three) times daily before meals. 3 caps with each meal), Disp: 180 capsule, Rfl:  1 .  metFORMIN (GLUCOPHAGE) 1000 MG tablet, TAKE 1 TABLET BY MOUTH TWICE DAILY WITH MEALS, Disp: 180 tablet, Rfl: 0 .  methocarbamol (ROBAXIN) 500 MG tablet, TAKE 1 TABLET BY MOUTH EVERY 6 HOURS AS NEEDED FOR MUSCLE SPASMS, Disp: 120 tablet, Rfl: 0 .  Multiple Vitamin (MULTIVITAMIN) tablet, Take 1 tablet by mouth daily.  , Disp: , Rfl:  .  ondansetron (ZOFRAN) 4 MG tablet, TAKE 1 TABLET(4 MG) BY MOUTH EVERY 8 HOURS AS NEEDED FOR NAUSEA OR VOMITING, Disp: 30 tablet, Rfl: 0 .  pantoprazole (PROTONIX) 40 MG tablet, TAKE 1 TABLET BY MOUTH  DAILY, Disp: 90 tablet, Rfl: 3 .  Probiotic Product (TRUBIOTICS PO), Take 1 capsule by mouth daily., Disp: , Rfl:  .  rosuvastatin (CRESTOR) 40 MG tablet, TAKE 1 TABLET BY MOUTH DAILY, Disp: 90 tablet, Rfl: 1 .  VENTOLIN HFA 108 (90 Base) MCG/ACT inhaler, INHALE 2 PUFFS BY MOUTH TWICE DAILY AS NEEDED FOR WHEEZING OR SHORTNESS OF BREATH, Disp: 54 g, Rfl: 3   Vital signs in last 24 hrs: Vitals:   08/23/17 1436  BP: 132/70  Pulse: 96    Physical Exam    HEENT: sclera anicteric, oral mucosa moist without lesions  Neck: supple, no thyromegaly, JVD or lymphadenopathy  Cardiac: RRR without murmurs, S1S2 heard, no peripheral edema  Pulm: clear to auscultation bilaterally, normal RR and effort noted  Abdomen: soft, mild epigastric tenderness, with active bowel sounds. No guarding or palpable hepatosplenomegaly.  No fullness or mass skin; warm and dry, no jaundice or rash  Recent Labs:  CBC Latest Ref Rng & Units 08/22/2017 07/19/2017 07/18/2017  WBC 3.8 - 10.8 Thousand/uL 6.6 3.2(L) 3.7(L)  Hemoglobin 11.7 - 15.5 g/dL 12.3 9.6(L) 9.6(L)  Hematocrit 35.0 - 45.0 % 37.6 32.0(L) 32.5(L)  Platelets 140 - 400 Thousand/uL 354 279 282   CMP Latest Ref Rng & Units 08/22/2017 07/19/2017 07/18/2017  Glucose 65 - 99 mg/dL 140(H) 130(H) 177(H)  BUN 7 - 25 mg/dL _0 Creatinine 0.50 - 0.99 mg/dL 1.00(H) 1.05(H) 1.13(H)  Sodium 135 - 146 mmol/L 140 142 142  Potassium 3.5 - 5.3 mmol/L 4.2 4.0 3.9  Chloride 98 - 110 mmol/L 103 115(H) 107  CO2 20 - 32 mmol/L _1 Calcium 8.6 - 10.4 mg/dL 10.2 8.8(L) 9.0  Total Protein 6.1 - 8.1 g/dL 6.7 5.7(L) -  Total Bilirubin 0.2 - 1.2 mg/dL 0.3 0.3 -  Alkaline Phos 38 - 126 U/L - 60 -  AST 10 - 35 U/L 14 19 -  ALT 6 - 29 U/L 14 18 -   Glucoses lately been elevated and Dr.Pickard apparently plans to put her back on an oral hypoglycemic.  One or more of her medicines were stopped during the recent hospital stay, I am concerned that they may have caused her  pancreatitis.    _2 @ Assessment: Encounter Diagnoses  Name Primary?  . 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 Upper abdominal pain   . Nausea without vomiting    She has chronic relapsing pancreatitis with many years of alcohol abstinence.  I think it is unlikely that 1 of her medicines caused it, and since there is persistent hyperglycemia, it seems like some oral agent will need to be resumed to achieve better glucose control. I suspect she will have ongoing upper abdominal pain, perhaps indefinitely.  I am hopeful that with sufficient pancreatic enzyme supplement, we can keep her symptoms under reasonable control and also hopefully prevent acute  exacerbations of this pain that landed her in the hospital.  Ideally, I would like a detailed imaging study to evaluate her pancreatic duct and see if there may be a stricture and/or stone that could have precipitated recent episodes.  I think the best thing would be an MRI, but she has great concerns about doing this due to claustrophobia.  I offered premedication with Valium, but she still does not feel she could do this.  As such, I will discuss the case with my partner Dr. Ardis Hughs and see if he thinks an endoscopic ultrasound might provide similar information.  She will try the increased dose of pancreatic enzyme for a couple of weeks.  Unless there is substantial improvement at that point, she will decrease to 2 36,000 unit capsules with every meal and one with snacks.  This should provide her sufficient enzyme supplement.   Total time 20 minutes, over half spent face-to-face with patient in counseling and coordination of care.   Nelida Meuse III

## 2017-08-24 ENCOUNTER — Telehealth: Payer: Self-pay

## 2017-08-24 NOTE — Telephone Encounter (Signed)
-----   Message from Milus Banister, MD sent at 08/24/2017  8:41 AM EDT ----- Mallie Mussel, I think EUS is a good next step here.  I will have Kambre Messner get in touch with her to schedule it.  Thanks  Shaquon Gropp, She needs upper EUS, radial +/- linear for chronic pancreatitis.  Next available EUS Thursday with MAC sedation.  Thanks   ----- Message ----- From: Doran Stabler, MD Sent: 08/23/2017   3:01 PM To: Milus Banister, MD  DJ,    Please review my 07/17/17 hospital consult and this note.  I am interested in an imaging study to look for PD stricture/stone as an explanation for recent episodes of acute on chronic pancreatitis (where it had been reportedly quiescent for many years).  She does not feel she can undergo an MRI (even with valium pre-medication) due to severe claustrophobia.  Do you think an EUS could give that info?  - HD

## 2017-08-25 ENCOUNTER — Other Ambulatory Visit: Payer: Self-pay | Admitting: Family Medicine

## 2017-08-25 ENCOUNTER — Other Ambulatory Visit: Payer: Self-pay

## 2017-08-25 ENCOUNTER — Ambulatory Visit: Payer: BLUE CROSS/BLUE SHIELD | Admitting: Gastroenterology

## 2017-08-25 DIAGNOSIS — K860 Alcohol-induced chronic pancreatitis: Secondary | ICD-10-CM

## 2017-08-25 MED ORDER — PIOGLITAZONE HCL 30 MG PO TABS: 30.0000 mg | ORAL_TABLET | Freq: Every day | ORAL | 3 refills | Status: DC

## 2017-08-25 NOTE — Telephone Encounter (Signed)
Dr. Kirke CorinArida, this pt has hx of PAD s/p R SFA stent in 05/2015, CAD cath 2011 treat medically, low risk myoview 03/2014.   Can Plavix be held for EUS (endoscopic ultrasound for recnet episodes of acute on chronic pancreatitis)?  Please route response back to CV DIV PREOP  Thank you

## 2017-08-25 NOTE — Telephone Encounter (Signed)
[facility] Pre-operative Risk Assessment     Request for surgical clearance:     Endoscopy Procedure  What type of surgery is being performed?     EUS   When is this surgery scheduled?     10/05/17  What type of clearance is required ?   Pharmacy  Are there any medications that need to be held prior to surgery and how long? Plavix  Practice name and name of physician performing surgery?      [facility]  What is your office phone and fax number?      Phone- [phone]  [phone]  Anesthesia type (None, local, MAC, general) ?       MAC

## 2017-08-25 NOTE — Telephone Encounter (Signed)
EUS scheduled, pt instructed and medications reviewed.  Patient instructions mailed to home.  Patient to call with any questions or concerns.  

## 2017-08-29 NOTE — Telephone Encounter (Signed)
Plavix can be held 7 days before procedure.  

## 2017-08-30 NOTE — Telephone Encounter (Signed)
The pt has been advised of the plavix hold.  All questions answered

## 2017-08-30 NOTE — Telephone Encounter (Signed)
   Primary Cardiologist: Dr Keturah BarreArdia  Chart reviewed as part of pre-operative protocol coverage. Given past medical history and time since last visit, based on ACC/AHA guidelines, Dustie Edwardscan hold Plavix 7 days pre op if needed.  I will route this recommendation to the requesting party via Epic fax function and remove from pre-op pool.  Please call with questions.  Corine ShelterLuke Dayani Winbush, PA-C 08/30/2017, 1:16 PM

## 2017-08-31 ENCOUNTER — Other Ambulatory Visit: Payer: Self-pay | Admitting: Family Medicine

## 2017-08-31 MED ORDER — BISOPROLOL FUMARATE 5 MG PO TABS: 2.5000 mg | ORAL_TABLET | Freq: Every day | ORAL | 3 refills | Status: DC

## 2017-09-03 ENCOUNTER — Other Ambulatory Visit: Payer: Self-pay | Admitting: Family Medicine

## 2017-09-11 ENCOUNTER — Other Ambulatory Visit: Payer: Self-pay | Admitting: Family Medicine

## 2017-09-11 NOTE — Telephone Encounter (Signed)
[facility]  Needs refill on percocet

## 2017-09-12 ENCOUNTER — Other Ambulatory Visit: Payer: Self-pay | Admitting: Family Medicine

## 2017-09-12 NOTE — Telephone Encounter (Signed)
Ok to refill 

## 2017-09-13 NOTE — Telephone Encounter (Signed)
Patient is requesting a refill on Hydrocodone   LOV: 08/22/17  LRF:   08/15/17

## 2017-09-14 ENCOUNTER — Other Ambulatory Visit: Payer: Self-pay | Admitting: Family Medicine

## 2017-09-14 MED ORDER — HYDROCODONE-ACETAMINOPHEN 5-325 MG PO TABS: 1.0000 | ORAL_TABLET | Freq: Four times a day (QID) | ORAL | 0 refills | Status: DC

## 2017-09-21 ENCOUNTER — Ambulatory Visit: Payer: BLUE CROSS/BLUE SHIELD | Admitting: Podiatry

## 2017-09-21 ENCOUNTER — Other Ambulatory Visit: Payer: Self-pay | Admitting: Family Medicine

## 2017-09-22 ENCOUNTER — Other Ambulatory Visit: Payer: Self-pay | Admitting: Family Medicine

## 2017-09-22 DIAGNOSIS — K861 Other chronic pancreatitis: Secondary | ICD-10-CM

## 2017-09-26 ENCOUNTER — Other Ambulatory Visit: Payer: Self-pay | Admitting: *Deleted

## 2017-09-26 ENCOUNTER — Other Ambulatory Visit: Payer: Self-pay | Admitting: Family Medicine

## 2017-09-26 DIAGNOSIS — K861 Other chronic pancreatitis: Secondary | ICD-10-CM

## 2017-09-26 MED ORDER — ONDANSETRON HCL 4 MG PO TABS: ORAL_TABLET | ORAL | 2 refills | Status: DC

## 2017-10-05 ENCOUNTER — Telehealth: Payer: Self-pay | Admitting: Gastroenterology

## 2017-10-05 ENCOUNTER — Ambulatory Visit (HOSPITAL_COMMUNITY): Payer: BLUE CROSS/BLUE SHIELD | Admitting: Anesthesiology

## 2017-10-05 ENCOUNTER — Encounter (HOSPITAL_COMMUNITY)
Admission: RE | Disposition: A | Discharge status: Home / Self Care | Payer: Self-pay | Source: Ambulatory Visit | Attending: Gastroenterology

## 2017-10-05 ENCOUNTER — Ambulatory Visit (HOSPITAL_COMMUNITY)
Admission: RE | Admit: 2017-10-05 | Discharge: 2017-10-05 | Disposition: A | Discharge status: Home / Self Care | Payer: BLUE CROSS/BLUE SHIELD | Source: Ambulatory Visit | Attending: Gastroenterology | Admitting: Gastroenterology

## 2017-10-05 ENCOUNTER — Other Ambulatory Visit: Payer: Self-pay

## 2017-10-05 ENCOUNTER — Encounter (HOSPITAL_COMMUNITY): Payer: Self-pay | Admitting: *Deleted

## 2017-10-05 DIAGNOSIS — I2510 Atherosclerotic heart disease of native coronary artery without angina pectoris: Secondary | ICD-10-CM | POA: Diagnosis not present

## 2017-10-05 DIAGNOSIS — Z791 Long term (current) use of non-steroidal anti-inflammatories (NSAID): Secondary | ICD-10-CM | POA: Diagnosis not present

## 2017-10-05 DIAGNOSIS — K860 Alcohol-induced chronic pancreatitis: Secondary | ICD-10-CM

## 2017-10-05 DIAGNOSIS — Z7984 Long term (current) use of oral hypoglycemic drugs: Secondary | ICD-10-CM | POA: Insufficient documentation

## 2017-10-05 DIAGNOSIS — F1021 Alcohol dependence, in remission: Secondary | ICD-10-CM | POA: Insufficient documentation

## 2017-10-05 DIAGNOSIS — K861 Other chronic pancreatitis: Secondary | ICD-10-CM | POA: Diagnosis present

## 2017-10-05 DIAGNOSIS — Z79891 Long term (current) use of opiate analgesic: Secondary | ICD-10-CM | POA: Insufficient documentation

## 2017-10-05 DIAGNOSIS — Z79899 Other long term (current) drug therapy: Secondary | ICD-10-CM | POA: Diagnosis not present

## 2017-10-05 DIAGNOSIS — Z91040 Latex allergy status: Secondary | ICD-10-CM | POA: Insufficient documentation

## 2017-10-05 DIAGNOSIS — E1151 Type 2 diabetes mellitus with diabetic peripheral angiopathy without gangrene: Secondary | ICD-10-CM | POA: Diagnosis not present

## 2017-10-05 DIAGNOSIS — R933 Abnormal findings on diagnostic imaging of other parts of digestive tract: Secondary | ICD-10-CM

## 2017-10-05 DIAGNOSIS — Z887 Allergy status to serum and vaccine status: Secondary | ICD-10-CM | POA: Diagnosis not present

## 2017-10-05 DIAGNOSIS — I10 Essential (primary) hypertension: Secondary | ICD-10-CM | POA: Insufficient documentation

## 2017-10-05 DIAGNOSIS — Z7982 Long term (current) use of aspirin: Secondary | ICD-10-CM | POA: Diagnosis not present

## 2017-10-05 DIAGNOSIS — Z96643 Presence of artificial hip joint, bilateral: Secondary | ICD-10-CM | POA: Insufficient documentation

## 2017-10-05 DIAGNOSIS — Z7951 Long term (current) use of inhaled steroids: Secondary | ICD-10-CM | POA: Diagnosis not present

## 2017-10-05 DIAGNOSIS — Z7902 Long term (current) use of antithrombotics/antiplatelets: Secondary | ICD-10-CM | POA: Diagnosis not present

## 2017-10-05 DIAGNOSIS — Z87891 Personal history of nicotine dependence: Secondary | ICD-10-CM | POA: Diagnosis not present

## 2017-10-05 DIAGNOSIS — K219 Gastro-esophageal reflux disease without esophagitis: Secondary | ICD-10-CM | POA: Insufficient documentation

## 2017-10-05 DIAGNOSIS — Z888 Allergy status to other drugs, medicaments and biological substances status: Secondary | ICD-10-CM | POA: Insufficient documentation

## 2017-10-05 HISTORY — PX: ESOPHAGOGASTRODUODENOSCOPY (EGD) WITH PROPOFOL: SHX5813

## 2017-10-05 HISTORY — PX: EUS: SHX5427

## 2017-10-05 LAB — GLUCOSE, CAPILLARY: Glucose-Capillary: 130 mg/dL — ABNORMAL HIGH (ref 70–99)

## 2017-10-05 SURGERY — UPPER ENDOSCOPIC ULTRASOUND (EUS) RADIAL
Anesthesia: Monitor Anesthesia Care

## 2017-10-05 MED ORDER — ONDANSETRON HCL 4 MG/2ML IJ SOLN
INTRAMUSCULAR | Status: DC | PRN
  Administered 2017-10-05: 4 mg via INTRAVENOUS

## 2017-10-05 MED ORDER — PROPOFOL 10 MG/ML IV BOLUS
INTRAVENOUS | Status: AC
  Filled 2017-10-05: qty 40

## 2017-10-05 MED ORDER — SODIUM CHLORIDE 0.9 % IV SOLN: INTRAVENOUS | Status: DC

## 2017-10-05 MED ORDER — LIDOCAINE 2% (20 MG/ML) 5 ML SYRINGE
INTRAMUSCULAR | Status: DC | PRN
  Administered 2017-10-05: 60 mg via INTRAVENOUS

## 2017-10-05 MED ORDER — PROPOFOL 10 MG/ML IV BOLUS
INTRAVENOUS | Status: DC | PRN
  Administered 2017-10-05 (×5): 20 mg via INTRAVENOUS
  Administered 2017-10-05 (×2): 30 mg via INTRAVENOUS
  Administered 2017-10-05 (×6): 20 mg via INTRAVENOUS
  Administered 2017-10-05: 30 mg via INTRAVENOUS

## 2017-10-05 MED ORDER — LACTATED RINGERS IV SOLN
INTRAVENOUS | Status: DC
  Administered 2017-10-05: 1000 mL via INTRAVENOUS

## 2017-10-05 NOTE — Anesthesia Preprocedure Evaluation (Signed)
Anesthesia Evaluation  Patient identified by MRN, date of birth, ID band Patient awake    Reviewed: Allergy & Precautions, NPO status , Patient's Chart, lab work & pertinent test results  Airway Mallampati: I  TM Distance: >3 FB Neck ROM: Full    Dental   Pulmonary former smoker,    Pulmonary exam normal        Cardiovascular hypertension, Pt. on medications + CAD  Normal cardiovascular exam     Neuro/Psych    GI/Hepatic GERD  Medicated and Controlled,  Endo/Other  diabetes, Type 2, Oral Hypoglycemic Agents  Renal/GU      Musculoskeletal   Abdominal   Peds  Hematology   Anesthesia Other Findings   Reproductive/Obstetrics                             Anesthesia Physical Anesthesia Plan  ASA: III  Anesthesia Plan: MAC   Post-op Pain Management:    Induction: Intravenous  PONV Risk Score and Plan: 2 and Ondansetron and Treatment may vary due to age or medical condition  Airway Management Planned: Simple Face Mask  Additional Equipment:   Intra-op Plan:   Post-operative Plan:   Informed Consent: I have reviewed the patients History and Physical, chart, labs and discussed the procedure including the risks, benefits and alternatives for the proposed anesthesia with the patient or authorized representative who has indicated his/her understanding and acceptance.     Plan Discussed with: CRNA and Surgeon  Anesthesia Plan Comments:         Anesthesia Quick Evaluation

## 2017-10-05 NOTE — Telephone Encounter (Signed)
Dr. Christella HartiganJacobs conveyed results of EUS to me.  Please arrange office visit with me in 6-8 weeks.  At that time, we will plan a repeat CT scan abdomen to assess the pancreas.  Stay on pancreatic enzyme supplements.

## 2017-10-05 NOTE — H&P (Signed)
HPI: This is a 62yo woman  Chief complaint is recurrent pancreatitis  ROS: complete GI ROS as described in HPI, all other review negative.  Constitutional:  No unintentional weight loss   Past Medical History:  Diagnosis Date  . Asthma with COPD (HCC)    PATIENT DENIES  . CAD (coronary artery disease)    a. LHC 2011: Diffuse distal and branch vessel disease - patient managed medically, no interventional options. b. Nuc 03/2014 - low risk, no ischemia.  . Complication of anesthesia    slow to wake up with last surgery in 2011   . Diabetes mellitus with circulatory complication (HCC)   . Dyslipidemia   . Headache    hx of migraines   . Hypertension   . Osteoarthritis    severe right knee  R TKR  . PAD (peripheral artery disease) (HCC)    a. Severe stenosis mid right SFA s/p atherectomy 03/25/09. b. peripheral angiography in 12/2011 which showed only about 50% diffuse right SFA stenosis.  . Pancreatitis 2003  . Renal artery stenosis (HCC)    a. LHC 2011 - 40-50% left RAS.  . Tobacco use disorder    quit 11/10    Past Surgical History:  Procedure Laterality Date  . ABDOMINAL AORTAGRAM N/A 12/21/2011   Procedure: ABDOMINAL Ronny FlurryAORTAGRAM;  Surgeon: Iran OuchMuhammad A Arida, MD;  Location: [facility] CATH LAB;  Service: Cardiovascular;  Laterality: N/A;  . CARDIAC CATHETERIZATION    . LOWER EXTREMITY ANGIOGRAM Bilateral 05/27/2015   Procedure: Lower Extremity Angiogram;  Surgeon: Iran OuchMuhammad A Arida, MD;  Location: [facility] INVASIVE CV LAB;  Service: Cardiovascular;  Laterality: Bilateral;  . PERIPHERAL VASCULAR CATHETERIZATION N/A 05/27/2015   Procedure: Abdominal Aortogram;  Surgeon: Iran OuchMuhammad A Arida, MD;  Location: [facility] INVASIVE CV LAB;  Service: Cardiovascular;  Laterality: N/A;  . PERIPHERAL VASCULAR CATHETERIZATION Right 05/27/2015   Procedure: Peripheral Vascular Intervention;  Surgeon: Iran OuchMuhammad A Arida, MD;  Location: [facility] INVASIVE CV LAB;  Service: Cardiovascular;  Laterality: Right;  SFA  . TOTAL KNEE  ARTHROPLASTY Right 2011  . TOTAL KNEE ARTHROPLASTY Left 05/22/2014   Procedure: LEFT TOTAL KNEE ARTHROPLASTY;  Surgeon: Ranee Gosselinonald Gioffre, MD;  Location: [facility];  Service: Orthopedics;  Laterality: Left;  . TUBAL LIGATION  1980    Current Facility-Administered Medications  Medication Dose Route Frequency Provider Last Rate Last Dose  . 0.9 %  sodium chloride infusion   Intravenous Continuous Rachael FeeJacobs, Daniel P, MD      . lactated ringers infusion   Intravenous Continuous Rachael FeeJacobs, Daniel P, MD 10 mL/hr at 10/05/17 0938 1,000 mL at 10/05/17 [id number]    Allergies as of 08/25/2017 - Review Complete 08/23/2017  Allergen Reaction Noted  . Gabapentin Other (See Comments)   . Glucotrol [glipizide] Rash and Other (See Comments) 07/26/2011  . Influenza vaccines Other (See Comments) 12/02/2011  . Latex Rash   . Nortriptyline Other (See Comments)   . Tetanus toxoid Hives, Swelling, Rash, and Other (See Comments)   . Invokana [canagliflozin] Other (See Comments) 04/17/2014    Family History  Problem Relation Age of Onset  . CVA Mother   . Emphysema Father   . Breast cancer Sister [id number]    Social History   Socioeconomic History  . Marital status: Married    Spouse name: Not on file  . Number of children: Not on file  . Years of education: 4810  . Highest education level: Not on file  Occupational History  . Not on file  Social Needs  .  Financial resource strain: Not on file  . Food insecurity:    Worry: Not on file    Inability: Not on file  . Transportation needs:    Medical: Not on file    Non-medical: Not on file  Tobacco Use  . Smoking status: Former Smoker    Last attempt to quit: 12/13/2008    Years since quitting: 8.8  . Smokeless tobacco: Never Used  Substance and Sexual Activity  . Alcohol use: No    Comment: Quit drinking around year 2000  . Drug use: No    Comment: Patient quit smoking around 2011  . Sexual activity: Not on file  Lifestyle  . Physical activity:    Days per  week: Not on file    Minutes per session: Not on file  . Stress: Not on file  Relationships  . Social connections:    Talks on phone: Not on file    Gets together: Not on file    Attends religious service: Not on file    Active member of club or organization: Not on file    Attends meetings of clubs or organizations: Not on file    Relationship status: Not on file  . Intimate partner violence:    Fear of current or ex partner: Not on file    Emotionally abused: Not on file    Physically abused: Not on file    Forced sexual activity: Not on file  Other Topics Concern  . Not on file  Social History Narrative   Denies any IV drug use or marijuana use.  Former smoker for 30 years, quit Nov. 2010. She is married with 2 children. Disabled from knee, not working.     Physical Exam: BP 107/64   Temp 97.7 F (36.5 C) (Oral)   Resp 15   Ht 5\' 3"  (1.6 m)   Wt 72.6 kg   SpO2 97%   BMI 28.34 kg/m  Constitutional: generally well-appearing Psychiatric: alert and oriented x3 Abdomen: soft, nontender, nondistended, no obvious ascites, no peritoneal signs, normal bowel sounds No peripheral edema noted in lower extremities  Assessment and plan: 62 y.o. female with recurrent pancreatitis  For upper EUS evaluation today  Please see the "Patient Instructions" section for addition details about the plan.  Rob Bunting, MD [facility] 10/05/2017, 9:40 AM '

## 2017-10-05 NOTE — Anesthesia Postprocedure Evaluation (Signed)
Anesthesia Post Note  Patient: Avenly Fairclough  Procedure(s) Performed: UPPER ENDOSCOPIC ULTRASOUND (EUS) RADIAL (N/A )     Patient location during evaluation: PACU Anesthesia Type: MAC Level of consciousness: awake and alert Pain management: pain level controlled Vital Signs Assessment: post-procedure vital signs reviewed and stable Respiratory status: spontaneous breathing, nonlabored ventilation, respiratory function stable and patient connected to nasal cannula oxygen Cardiovascular status: stable and blood pressure returned to baseline Postop Assessment: no apparent nausea or vomiting Anesthetic complications: no    Last Vitals:  Vitals:   10/05/17 1130 10/05/17 1140  BP: 138/71 124/68  Pulse: 74 73  Resp: (!) 25 16  Temp:    SpO2: 96% 98%    Last Pain:  Vitals:   10/05/17 1140  TempSrc:   PainSc: 0-No pain                 Eyla Tallon DAVID

## 2017-10-05 NOTE — Transfer of Care (Signed)
Immediate Anesthesia Transfer of Care Note  Patient: Dustine [name]  Procedure(s) Performed: UPPER ENDOSCOPIC ULTRASOUND (EUS) RADIAL (N/A )  Patient Location: PACU and Endoscopy Unit  Anesthesia Type:MAC  Level of Consciousness: awake, alert  and oriented  Airway & Oxygen Therapy: Patient Spontanous Breathing and Patient connected to nasal cannula oxygen  Post-op Assessment: Report given to RN and Post -op Vital signs reviewed and stable  Post vital signs: Reviewed and stable  Last Vitals:  Vitals Value Taken Time  BP    Temp    Pulse 75 10/05/2017 10:55 AM  Resp 16 10/05/2017 10:55 AM  SpO2 98 % 10/05/2017 10:55 AM  Vitals shown include unvalidated device data.  Last Pain:  Vitals:   10/05/17 0915  TempSrc: Oral  PainSc: 6       Patients Stated Pain Goal: 3 (67/34/19 3790)  Complications: No apparent anesthesia complications

## 2017-10-05 NOTE — Discharge Instructions (Signed)
YOU HAD AN ENDOSCOPIC PROCEDURE TODAY: Refer to the procedure report and other information in the discharge instructions given to you for any specific questions about what was found during the examination. If this information does not answer your questions, please call [place] office at [phone] to clarify.   YOU SHOULD EXPECT: Some feelings of bloating in the abdomen. Passage of more gas than usual. Walking can help get rid of the air that was put into your GI tract during the procedure and reduce the bloating. If you had a lower endoscopy (such as a colonoscopy or flexible sigmoidoscopy) you may notice spotting of blood in your stool or on the toilet paper. Some abdominal soreness may be present for a day or two, also.  DIET: Your first meal following the procedure should be a light meal and then it is ok to progress to your normal diet. A half-sandwich or bowl of soup is an example of a good first meal. Heavy or fried foods are harder to digest and may make you feel nauseous or bloated. Drink plenty of fluids but you should avoid alcoholic beverages for 24 hours. If you had a esophageal dilation, please see attached instructions for diet.    ACTIVITY: Your care partner should take you home directly after the procedure. You should plan to take it easy, moving slowly for the rest of the day. You can resume normal activity the day after the procedure however YOU SHOULD NOT DRIVE, use power tools, machinery or perform tasks that involve climbing or major physical exertion for 24 hours (because of the sedation medicines used during the test).   SYMPTOMS TO REPORT IMMEDIATELY: A gastroenterologist can be reached at any hour. Please call [phone]  for any of the following symptoms:   Following upper endoscopy (EGD, EUS, ERCP, esophageal dilation) Vomiting of blood or coffee ground material  New, significant abdominal pain  New, significant chest pain or pain under the shoulder blades  Painful or  persistently difficult swallowing  New shortness of breath  Black, tarry-looking or red, bloody stools  FOLLOW UP:  If any biopsies were taken you will be contacted by phone or by letter within the next 1-3 weeks. Call [phone]  if you have not heard about the biopsies in 3 weeks.  Please also call with any specific questions about appointments or follow up tests.  

## 2017-10-05 NOTE — Anesthesia Procedure Notes (Signed)
Date/Time: 10/05/2017 10:26 AM Performed by: Jhonnie GarnerMarshall, Luvern Mcisaac M, CRNA Oxygen Delivery Method: Nasal cannula

## 2017-10-05 NOTE — Op Note (Signed)
[facility] Patient Name: [name] Procedure Date: 10/05/2017 MRN: [id number] Attending MD: Rachael Fee , MD Date of Birth: Mar 20, 1955 CSN: [id number] Age: 62 Admit Type: Outpatient Procedure:                Upper EUS Indications:              relapsing pancreatitis, former etoh abuse;                            calcifications in pancreas on recent CTs Providers:                Rachael Fee, MD, Dwain Sarna, RN, Verita Schneiders, Technician, Doreene Burke, CRNA Referring MD:             Amada Jupiter, MD Medicines:                Monitored Anesthesia Care Complications:            No immediate complications. Estimated blood loss:                            None. Estimated Blood Loss:     Estimated blood loss: none. Procedure:                Pre-Anesthesia Assessment:                           - Prior to the procedure, a History and Physical                            was performed, and patient medications and                            allergies were reviewed. The patient's tolerance of                            previous anesthesia was also reviewed. The risks                            and benefits of the procedure and the sedation                            options and risks were discussed with the patient.                            All questions were answered, and informed consent                            was obtained. Prior Anticoagulants: The patient has                            taken Plavix (clopidogrel), last dose was 5 days  prior to procedure. ASA Grade Assessment: III - A                            patient with severe systemic disease. After                            reviewing the risks and benefits, the patient was                            deemed in satisfactory condition to undergo the                            procedure.                           After obtaining informed consent, the  endoscope was                            passed under direct vision. Throughout the                            procedure, the patient's blood pressure, pulse, and                            oxygen saturations were monitored continuously. The                            [id number]-[id number] ([id number]) Olympus Radial EUS was                            introduced through the mouth, and advanced to the                            second part of duodenum. The upper EUS was                            accomplished without difficulty. The patient                            tolerated the procedure well. Scope In: Scope Out: Findings:      ENDOSCOPIC FINDING: :      Limited views of the UGI tract were all normal.      ENDOSONOGRAPHIC FINDING: :      1. The examination was limited by shadowing calcifications in the head,       uncinate pancreas which can significantly limit the sensitivity of EUS.      2. With the above caveat, there were no parenchymal masses in the       pancreas. There were punctate calcifications and scattered hyperechoic       linear strands all suggestive of chronic pancreatitis. No obvious       stricture or stones involving the main pancreatic duct.      3. Suggestion of persisent fluid, edema along the duodenum and in around       the head of the pancreas.      4.  No peripancreatic adenopathy.      5. Gallbladder was normal.      6. CBD was non-dilated and did not contain obvious stones or sludge.      7. Limited views of the liver, spleen, portal and splenic vessels were       all normal. Impression:               - The examination was limited by shadowing                            calcifications in the head, uncinate pancreas which                            can significantly limit the sensitivity of EUS.                            With the above caveat, there were no parenchymal                            masses in the pancreas. No obvious stricture or                             stones involving the main pancreatic duct. There                            were punctate calcifications and scattered                            hyperechoic linear strands all suggestive of                            chronic pancreatitis. Suggestion of persisent                            fluid, edema along the duodenum and in around the                            head of the pancreas. Moderate Sedation:      N/A- Per Anesthesia Care Recommendation:           - Discharge patient to home (ambulatory).                           - Follow up with Dr. Myrtie Neither. Would repeat cross                            sectional imaging in 2-3 months to check for                            resolution of peripancreatic fluid, underying mass                            lesions? Procedure Code(s):        --- Professional ---  [id number], Esophagogastroduodenoscopy, flexible,                            transoral; with endoscopic ultrasound examination                            limited to the esophagus, stomach or duodenum, and                            adjacent structures Diagnosis Code(s):        --- Professional ---                           R93.3, Abnormal findings on diagnostic imaging of                            other parts of digestive tract CPT copyright 2017 [employer]. All rights reserved. The codes documented in this report are preliminary and upon coder review may  be revised to meet current compliance requirements. Rachael Feeaniel P Jacobs, MD 10/05/2017 11:31:23 AM This report has been signed electronically. Number of Addenda: 0

## 2017-10-06 ENCOUNTER — Encounter (HOSPITAL_COMMUNITY): Payer: Self-pay | Admitting: Gastroenterology

## 2017-10-06 NOTE — Telephone Encounter (Signed)
Follow up scheduled for 11-17-2017

## 2017-10-06 NOTE — Telephone Encounter (Signed)
Left a message to return call.  

## 2017-10-06 NOTE — Telephone Encounter (Signed)
Follow up visit has been scheduled for 11/17/17 at 2pm with Dr.Danis.

## 2017-10-09 ENCOUNTER — Other Ambulatory Visit: Payer: Self-pay | Admitting: Family Medicine

## 2017-10-09 NOTE — Telephone Encounter (Signed)
Patient is calling to get refill on her hydrocodone  To be sent to [facility]

## 2017-10-10 ENCOUNTER — Other Ambulatory Visit: Payer: Self-pay | Admitting: Family Medicine

## 2017-10-10 MED ORDER — HYDROCODONE-ACETAMINOPHEN 5-325 MG PO TABS: 1.0000 | ORAL_TABLET | Freq: Four times a day (QID) | ORAL | 0 refills | Status: DC

## 2017-10-10 NOTE — Telephone Encounter (Signed)
Patient is requesting a refill on Hydrocodone   LOV: 08/22/17  LRF:   09/14/17

## 2017-10-17 ENCOUNTER — Other Ambulatory Visit: Payer: Self-pay | Admitting: Family Medicine

## 2017-11-06 ENCOUNTER — Other Ambulatory Visit: Payer: Self-pay | Admitting: Family Medicine

## 2017-11-06 MED ORDER — HYDROCODONE-ACETAMINOPHEN 5-325 MG PO TABS: 1.0000 | ORAL_TABLET | Freq: Four times a day (QID) | ORAL | 0 refills | Status: DC

## 2017-11-06 NOTE — Telephone Encounter (Signed)
Patient needs hydrocodone sent into [facility]

## 2017-11-06 NOTE — Telephone Encounter (Signed)
Patient is requesting a refill on Hydrocodone   LOV: 08/22/17   LRF:   10/10/17

## 2017-11-07 NOTE — Telephone Encounter (Signed)
Requesting refill    Robaxin  LOV: 08/22/17  LRF:  10/10/17

## 2017-11-17 ENCOUNTER — Encounter (INDEPENDENT_AMBULATORY_CARE_PROVIDER_SITE_OTHER): Payer: Self-pay

## 2017-11-17 ENCOUNTER — Ambulatory Visit: Payer: BLUE CROSS/BLUE SHIELD | Admitting: Gastroenterology

## 2017-11-17 ENCOUNTER — Encounter: Payer: Self-pay | Admitting: Gastroenterology

## 2017-11-17 VITALS — BP 138/70 | HR 78 | Ht 63.0 in | Wt 167.2 lb

## 2017-11-17 DIAGNOSIS — K860 Alcohol-induced chronic pancreatitis: Secondary | ICD-10-CM | POA: Diagnosis not present

## 2017-11-17 DIAGNOSIS — R110 Nausea: Secondary | ICD-10-CM

## 2017-11-17 DIAGNOSIS — R1010 Upper abdominal pain, unspecified: Secondary | ICD-10-CM | POA: Diagnosis not present

## 2017-11-17 DIAGNOSIS — R634 Abnormal weight loss: Secondary | ICD-10-CM | POA: Diagnosis not present

## 2017-11-17 NOTE — Progress Notes (Signed)
[name] Progress Note  Chief Complaint: Chronic pancreatitis  Subjective  History:  [name] follows up for her chronic pancreatitis, last office visit 08/23/2017.  She has chronic epigastric pain, bloating and gas. She also continues pancreatic enzyme supplements, capsules with each meal.  She underwent EUS with Dr. Ardis Hughs showing the following:  1. The examination was limited by shadowing calcifications in the head, uncinate pancreas which can significantly limit the sensitivity of EUS. 2. With the above caveat, there were no parenchymal masses in the pancreas. There were punctate calcifications and scattered hyperechoic linear strands all suggestive of chronic pancreatitis. No obvious stricture or stones involving the main pancreatic duct. 3. Suggestion of persisent fluid, edema along the duodenum and in around the head of the pancreas. 4. No peripancreatic adenopathy. 5. Gallbladder was normal. 6. CBD was non-dilated and did not contain obvious stones or sludge. 7. Limited views of the liver, spleen, portal and splenic vessels were all normal.   [name] continues to have upper abdominal pain, more so after meals.  She reports having "more bad days than good".  She has noticed that heavy or or fatty foods more likely trigger the symptoms.  She has not had alcohol in years.  She is lately bothered by plantar fasciitis and will need surgery soon.  She wondered my thoughts about whether the pancreatitis posed any increased risk for surgery.  She reports having lost a total of 40 pounds since her hospitalization a few months ago.  Her appetite and food intake have decreased.  ROS: Cardiovascular:  no chest pain Respiratory: no dyspnea Chronic left foot pain Remainder of systems negative except as above The patient's Past Medical, Family and Social History were reviewed and are on file in the EMR.  Objective:  Med list reviewed  Current Outpatient Medications:  .   acetaminophen (TYLENOL) 500 MG tablet, Take 1,000 mg by mouth every 6 (six) hours as needed for moderate pain or headache., Disp: , Rfl:  .  aspirin 81 MG tablet, Take 81 mg by mouth daily.  , Disp: , Rfl:  .  Biotin w/ Vitamins C & E (HAIR/SKIN/NAILS PO), Take 1 tablet by mouth daily., Disp: , Rfl:  .  bisoprolol (ZEBETA) 5 MG tablet, Take 0.5 tablets (2.5 mg total) by mouth daily. (Patient taking differently: Take 2.5 mg by mouth at bedtime. ), Disp: 90 tablet, Rfl: 3 .  Blood Glucose Monitoring Suppl (BAYER CONTOUR MONITOR) w/Device KIT, CHECK BLOOD SUGAR EVERY DAY, Disp: 1 kit, Rfl: 0 .  clopidogrel (PLAVIX) 75 MG tablet, Take 1 tablet (75 mg total) by mouth daily., Disp: 30 tablet, Rfl: 11 .  CONTOUR NEXT TEST test strip, CHECK BLOOD SUGAR DAILY AS DIRECTED, Disp: 100 each, Rfl: 11 .  CREON 36000 units CPEP capsule, TAKE 2 CAPSULES BY MOUTH THREE TIMES DAILY, BEFORE MEALS (Patient taking differently: Take 72,000 Units by mouth 3 (three) times daily before meals. ), Disp: 180 capsule, Rfl: 3 .  diclofenac sodium (VOLTAREN) 1 % GEL, Apply 2 g topically 4 (four) times daily. (Patient taking differently: Apply 2 g topically 4 (four) times daily as needed (for hand pain). ), Disp: 100 g, Rfl: 0 .  ferrous sulfate 325 (65 FE) MG tablet, Take 325 mg by mouth daily with breakfast., Disp: , Rfl:  .  Fluticasone-Salmeterol (ADVAIR) 250-50 MCG/DOSE AEPB, Inhale 1 puff into the lungs 2 (two) times daily., Disp: , Rfl:  .  HYDROcodone-acetaminophen (NORCO/VICODIN) 5-325 MG tablet, Take 1 tablet by  mouth 4 (four) times daily., Disp: 120 tablet, Rfl: 0 .  isosorbide mononitrate (IMDUR) 60 MG 24 hr tablet, TAKE 1 TABLET BY MOUTH DAILY (Patient taking differently: Take 60 mg by mouth daily. ), Disp: 90 tablet, Rfl: 2 .  metFORMIN (GLUCOPHAGE) 1000 MG tablet, TAKE 1 TABLET BY MOUTH TWICE DAILY WITH MEALS (Patient taking differently: Take 1,000 mg by mouth 2 (two) times daily with a meal. ), Disp: 180 tablet, Rfl:  0 .  methocarbamol (ROBAXIN) 500 MG tablet, TAKE 1 TABLET BY MOUTH EVERY 6 HOURS AS NEEDED FOR MUSCLE SPASMS, Disp: 120 tablet, Rfl: 2 .  Multiple Vitamin (MULTIVITAMIN) tablet, Take 1 tablet by mouth daily.  , Disp: , Rfl:  .  ondansetron (ZOFRAN) 4 MG tablet, TAKE 1 TABLET(4 MG) BY MOUTH EVERY 8 HOURS AS NEEDED FOR NAUSEA OR VOMITING (Patient taking differently: Take 4 mg by mouth every 8 (eight) hours as needed for nausea or vomiting. ), Disp: 30 tablet, Rfl: 2 .  pantoprazole (PROTONIX) 40 MG tablet, TAKE 1 TABLET BY MOUTH DAILY (Patient taking differently: Take 40 mg by mouth daily. ), Disp: 90 tablet, Rfl: 3 .  pioglitazone (ACTOS) 30 MG tablet, Take 1 tablet (30 mg total) by mouth daily., Disp: 90 tablet, Rfl: 3 .  Probiotic Product (TRUBIOTICS PO), Take 1 capsule by mouth daily., Disp: , Rfl:  .  rosuvastatin (CRESTOR) 40 MG tablet, Take 1 tablet (40 mg total) by mouth at bedtime., Disp: 90 tablet, Rfl: 3 .  VENTOLIN HFA 108 (90 Base) MCG/ACT inhaler, INHALE 2 PUFFS BY MOUTH TWICE DAILY AS NEEDED FOR WHEEZING OR SHORTNESS OF BREATH (Patient taking differently: Inhale 1 puff into the lungs 2 (two) times daily as needed for wheezing or shortness of breath. ), Disp: 18 g, Rfl: 3 .  WIXELA INHUB 250-50 MCG/DOSE AEPB, INHALE 1 PUFF BY MOUTH INTO THE LUNGS EVERY 12 HOURS, Disp: 60 each, Rfl: 3   Vital signs in last 24 hrs: Vitals:   11/17/17 1345  BP: 138/70  Pulse: 78    Physical Exam General: Not acutely ill-appearing pleasant and conversational  HEENT: sclera anicteric, oral mucosa moist without lesions  Neck: supple, no thyromegaly, JVD or lymphadenopathy  Cardiac: RRR without murmurs, S1S2 heard, no peripheral edema  Pulm: clear to auscultation bilaterally, normal RR and effort noted  Abdomen: soft, epigastric tenderness, with active bowel sounds. No guarding or palpable hepatosplenomegaly.  Skin; warm and dry, no jaundice or  rash   @ASSESSMENTPLANBEGIN @ Assessment: Encounter Diagnoses  Name Primary?  . 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 Upper abdominal pain   . Nausea without vomiting   . Abnormal loss of weight     She has chronic relapsing pancreatitis with a prolonged bout of severe pancreatitis a few months ago.  I still wonder if she may have pancreatic duct stricture or stone that was not evident on EUS due to the technical limitations of the chronic pancreatitis changes.  She still does not feel she could undergo an MRI, having tried in the past for a shoulder problem and says she had terrible claustrophobia and anxiety.  I offered pretreatment with Valium, but she does not think she could do it.  [name] will continue pancreatic enzyme supplements, 2 capsules with each meal.  Plan: CT scan pancreatic protocol about 4 weeks from now.  I expect that by then the pancreatic inflammation should have subsided to allow a more detailed view of the head of the pancreas rule out a malignancy.  Ideally, I would like an MRCP instead to evaluate pancreatic ductal anatomy.  I am concerned about the weight loss, and it seems to be related to pain related decrease in food intake.  I would be concerned about chronic use of opioid analgesics with her chronic pain syndrome and history of alcohol abuse.  Based on CT findings, and if weight loss continues, I might consider tramadol.  I do not think her pancreatitis poses an increased risk of surgery for plantar fasciitis.    Total time 30 minutes, over half spent face-to-face with patient in counseling and coordination of care.   Nelida Meuse III

## 2017-11-17 NOTE — Patient Instructions (Signed)
If you are age 60 or older, your body mass index should be between 23-30. Your Body mass index is 29.63 kg/m. If this is out of the aforementioned range listed, please consider follow up with your Primary Care Provider.  If you are age 29 or younger, your body mass index should be between 19-25. Your Body mass index is 29.63 kg/m. If this is out of the aformentioned range listed, please consider follow up with your Primary Care Provider.   You have been scheduled for a CT scan of the abdomenat [facility] ([address]---this is in the same building as Press [facility]).   You are scheduled on 12-08-2017 at 1pm. You should arrive 15 minutes prior to your appointment time for registration. Please follow the written instructions below on the day of your exam:  WARNING: IF YOU ARE ALLERGIC TO IODINE/X-RAY DYE, PLEASE NOTIFY RADIOLOGY IMMEDIATELY AT [phone]! YOU WILL BE GIVEN A 13 HOUR PREMEDICATION PREP.  1) Do not eat  anything 2 hours prior to the CT.  You may take any medications as prescribed with a small amount of water, if necessary. If you take any of the following medications: METFORMIN, GLUCOPHAGE, GLUCOVANCE, AVANDAMET, RIOMET, FORTAMET, Ayr MET, JANUMET, GLUMETZA or METAGLIP, you MAY be asked to HOLD this medication 48 hours AFTER the exam.  The purpose of you drinking the oral contrast is to aid in the visualization of your intestinal tract. The contrast solution may cause some diarrhea. Depending on your individual set of symptoms, you may also receive an intravenous injection of x-ray contrast/dye. Plan on being at [facility] for 30 minutes or longer, depending on the type of exam you are having performed.  This test typically takes 30-45 minutes to complete.  If you have any questions regarding your exam or if you need to reschedule, you may call the CT department at [phone] between the hours of 8:00 am and 5:00 pm, Monday-Friday.  It was a  pleasure to see you today!  Dr. Loletha Carrow  ________________________________________________________________________

## 2017-11-22 ENCOUNTER — Ambulatory Visit (INDEPENDENT_AMBULATORY_CARE_PROVIDER_SITE_OTHER): Payer: BLUE CROSS/BLUE SHIELD

## 2017-11-22 ENCOUNTER — Ambulatory Visit: Payer: BLUE CROSS/BLUE SHIELD | Admitting: Podiatry

## 2017-11-22 ENCOUNTER — Encounter: Payer: Self-pay | Admitting: Podiatry

## 2017-11-22 DIAGNOSIS — M722 Plantar fascial fibromatosis: Secondary | ICD-10-CM | POA: Diagnosis not present

## 2017-11-22 NOTE — Patient Instructions (Signed)
Pre-Operative Instructions  Congratulations, you have decided to take an important step towards improving your quality of life.  You can be assured that the doctors and staff at [facility] will be with you every step of the way.  Here are some important things you should know:  1. Plan to be at the surgery center/hospital at least 1 (one) hour prior to your scheduled time, unless otherwise directed by the surgical center/hospital staff.  You must have a responsible adult accompany you, remain during the surgery and drive you home.  Make sure you have directions to the surgical center/hospital to ensure you arrive on time. 2. If you are having surgery at [facility] or [facility] hospitals, you will need a copy of your medical history and physical form from your family physician within one month prior to the date of surgery. We will give you a form for your primary physician to complete.  3. We make every effort to accommodate the date you request for surgery.  However, there are times where surgery dates or times have to be moved.  We will contact you as soon as possible if a change in schedule is required.   4. No aspirin/ibuprofen for one week before surgery.  If you are on aspirin, any non-steroidal anti-inflammatory medications (Mobic, Aleve, Ibuprofen) should not be taken seven (7) days prior to your surgery.  You make take Tylenol for pain prior to surgery.  5. Medications - If you are taking daily heart and blood pressure medications, seizure, reflux, allergy, asthma, anxiety, pain or diabetes medications, make sure you notify the surgery center/hospital before the day of surgery so they can tell you which medications you should take or avoid the day of surgery. 6. No food or drink after midnight the night before surgery unless directed otherwise by surgical center/hospital staff. 7. No alcoholic beverages 24-hours prior to surgery.  No smoking 24-hours prior or 24-hours after  surgery. 8. Wear loose pants or shorts. They should be loose enough to fit over bandages, boots, and casts. 9. Don't wear slip-on shoes. Sneakers are preferred. 10. Bring your boot with you to the surgery center/hospital.  Also bring crutches or a walker if your physician has prescribed it for you.  If you do not have this equipment, it will be provided for you after surgery. 11. If you have not been contacted by the surgery center/hospital by the day before your surgery, call to confirm the date and time of your surgery. 12. Leave-time from work may vary depending on the type of surgery you have.  Appropriate arrangements should be made prior to surgery with your employer. 13. Prescriptions will be provided immediately following surgery by your doctor.  Fill these as soon as possible after surgery and take the medication as directed. Pain medications will not be refilled on weekends and must be approved by the doctor. 14. Remove nail polish on the operative foot and avoid getting pedicures prior to surgery. 15. Wash the night before surgery.  The night before surgery wash the foot and leg well with water and the antibacterial soap provided. Be sure to pay special attention to beneath the toenails and in between the toes.  Wash for at least three (3) minutes. Rinse thoroughly with water and dry well with a towel.  Perform this wash unless told not to do so by your physician.  Enclosed: 1 Ice pack (please put in freezer the night before surgery)   1 Hibiclens skin cleaner     Pre-op instructions  If you have any questions regarding the instructions, please do not hesitate to call our office.  [place]: [address], [place], [place] [address] -- [phone]  [place]: [address] [place], [place] [address] -- [phone]  [place]: [address]  [place], [place] [address] -- [phone]  [place]: [address], [address], [place], [place] [address] -- [phone]  Website:  [email or web address] 

## 2017-11-26 NOTE — Progress Notes (Signed)
Subjective:   Patient ID: [name], female   DOB: 62 y.o.   MRN: [id number]   HPI Patient presents with chronic pain in the left heel but simply is not responding to conservative care.  She is tried different modalities without relief and its been going on for an extended period of time and is increasingly making it hard to be active.  Patient is found to have good digital perfusion is well oriented x3   ROS      Objective:  Physical Exam  Exquisite discomfort plantar aspect left heel at the insertional point of the tendon into the calcaneus with inflammation fluid around the medial band     Assessment:  Acute plantar fasciitis left with inflammation fluid of the medial band     Plan:  Reviewed treatment options available and she is tired of this pain is and has tried a lot of conservative treatment and wants surgery.  I recommended endoscopic release and I allowed patient to read consent form going over alternative treatments complications associated with surgery and she is willing to accept risk understanding the risk involved with this procedure.  At this point I went ahead allowed her to sign consent form I explained recovery and that it can take 6 months to 1 year and I reviewed boot usage.  I also explained it could create long-term arch pain or lateral foot pain that may take extended period of time to resolve

## 2017-12-01 ENCOUNTER — Encounter: Payer: Self-pay | Admitting: *Deleted

## 2017-12-01 ENCOUNTER — Telehealth: Payer: Self-pay | Admitting: *Deleted

## 2017-12-01 NOTE — Telephone Encounter (Signed)
PRIMARY  CARDIOLOGIST DR [facility] Pre-operative Risk Assessment    Request for surgical clearance:  1. What type of surgery is being performed? ENDOSCOPIC  PLANTAR FASCIOTOMY OF THE LEFT FOOT   2. When is this surgery scheduled? 12/12/17  3. What type of clearance is required (medical clearance vs. Pharmacy clearance to hold med vs. Both)? MEDICAL  4. Are there any medications that need to be held prior to surgery and how long?PLAVIX 75 MG  5. Practice name and name of physician performing surgery? [facility] ; [place] [facility]  6. What is your office phone number [phone]    7.   What is your office fax number [phone] ----  MAY ALSO RESPOND VIA Epic-IN-BASKET  8.   Anesthesia type (None, local, MAC, general) ? GENERAL   Raiford Simmonds 12/01/2017, 1:11 PM  _________________________________________________________________   (provider comments below)

## 2017-12-01 NOTE — Progress Notes (Signed)
A request for medical clearance was sent to Dr. Kirke Corin via fax and Epic in-basket.

## 2017-12-04 ENCOUNTER — Other Ambulatory Visit: Payer: Self-pay | Admitting: Family Medicine

## 2017-12-04 MED ORDER — HYDROCODONE-ACETAMINOPHEN 5-325 MG PO TABS: 1.0000 | ORAL_TABLET | Freq: Four times a day (QID) | ORAL | 0 refills | Status: DC

## 2017-12-04 NOTE — Telephone Encounter (Signed)
Rec'd message to cc'd charts in preop box:  Author: Iran Ouch, MD Service: Cardiology Author Type: Physician  Filed: 12/04/2017 3:25 PM Encounter Date: 12/01/2017 Status: Signed  Editor: Iran Ouch, MD (Physician)      Added by: [x] Iran Ouch, MD  [] Hover for details I already cleared this patient.  No need for you to do.        Will remove from pre-op app box.  Dayna Dunn PA-C

## 2017-12-04 NOTE — Telephone Encounter (Signed)
Patient is requesting a refill on Hydrocodone   LOV: 08/22/17  LRF:   11/06/17

## 2017-12-04 NOTE — Progress Notes (Signed)
I already cleared this patient.  No need for you to do.

## 2017-12-04 NOTE — Progress Notes (Signed)
She is at low risk from a cardiac standpoint.  She can hold Plavix 5 days before surgery and she should resume it after. Thanks.   Dr. Kirke Corin

## 2017-12-04 NOTE — Telephone Encounter (Signed)
Refill on hydrocodone to [facility]

## 2017-12-05 ENCOUNTER — Telehealth: Payer: Self-pay | Admitting: *Deleted

## 2017-12-05 NOTE — Telephone Encounter (Signed)
I called and informed the patient that Dr. Kirke Corin said for her to stop taking the Plavix five days before the surgery date.  She asked about the Aspirin.  I informed her he just mentioned the Plavix.  She stated she understood.

## 2017-12-06 ENCOUNTER — Other Ambulatory Visit: Payer: Self-pay

## 2017-12-06 DIAGNOSIS — K860 Alcohol-induced chronic pancreatitis: Secondary | ICD-10-CM

## 2017-12-07 ENCOUNTER — Other Ambulatory Visit (INDEPENDENT_AMBULATORY_CARE_PROVIDER_SITE_OTHER): Payer: BLUE CROSS/BLUE SHIELD

## 2017-12-07 DIAGNOSIS — K860 Alcohol-induced chronic pancreatitis: Secondary | ICD-10-CM | POA: Diagnosis not present

## 2017-12-07 LAB — BASIC METABOLIC PANEL
BUN: 19 mg/dL (ref 6–23)
CO2: 28 mEq/L (ref 19–32)
Calcium: 10.5 mg/dL (ref 8.4–10.5)
Chloride: 102 mEq/L (ref 96–112)
Creatinine, Ser: 1.01 mg/dL (ref 0.40–1.20)
GFR: 59.28 mL/min — AB (ref >60.00)
GLUCOSE: 125 mg/dL — AB (ref 70–99)
POTASSIUM: 4.7 meq/L (ref 3.5–5.1)
SODIUM: 139 meq/L (ref 135–145)

## 2017-12-08 ENCOUNTER — Ambulatory Visit (INDEPENDENT_AMBULATORY_CARE_PROVIDER_SITE_OTHER)
Admission: RE | Admit: 2017-12-08 | Discharge: 2017-12-08 | Disposition: A | Discharge status: Home / Self Care | Payer: BLUE CROSS/BLUE SHIELD | Source: Ambulatory Visit | Attending: Gastroenterology | Admitting: Gastroenterology

## 2017-12-08 ENCOUNTER — Other Ambulatory Visit: Payer: Self-pay | Admitting: Family Medicine

## 2017-12-08 DIAGNOSIS — K860 Alcohol-induced chronic pancreatitis: Secondary | ICD-10-CM

## 2017-12-08 DIAGNOSIS — R110 Nausea: Secondary | ICD-10-CM

## 2017-12-08 DIAGNOSIS — R634 Abnormal weight loss: Secondary | ICD-10-CM

## 2017-12-08 DIAGNOSIS — R1010 Upper abdominal pain, unspecified: Secondary | ICD-10-CM

## 2017-12-08 MED ORDER — IOPAMIDOL (ISOVUE-300) INJECTION 61%
100.0000 mL | Freq: Once | INTRAVENOUS | Status: AC | PRN
  Administered 2017-12-08: 100 mL via INTRAVENOUS

## 2017-12-12 ENCOUNTER — Encounter: Payer: Self-pay | Admitting: Podiatry

## 2017-12-12 ENCOUNTER — Telehealth: Payer: Self-pay | Admitting: *Deleted

## 2017-12-12 DIAGNOSIS — M722 Plantar fascial fibromatosis: Secondary | ICD-10-CM | POA: Diagnosis not present

## 2017-12-12 NOTE — Telephone Encounter (Signed)
Pt states she had surgery today with Dr. Charlsie Merles and her foot has bleed through.

## 2017-12-12 NOTE — Telephone Encounter (Signed)
I asked pt if it was larger than a 50 cent piece and she stated it was. I told pt that at this early after surgery it was probably related to riding in the car with the surgery foot down. I told pt to mark the area and if it got larger to call again, but it should be stable, to rest, ice and elevate. Pt states understanding.

## 2017-12-14 ENCOUNTER — Other Ambulatory Visit: Payer: Self-pay | Admitting: Gastroenterology

## 2017-12-14 ENCOUNTER — Other Ambulatory Visit: Payer: Self-pay

## 2017-12-14 ENCOUNTER — Telehealth: Payer: Self-pay | Admitting: Gastroenterology

## 2017-12-14 DIAGNOSIS — K860 Alcohol-induced chronic pancreatitis: Secondary | ICD-10-CM

## 2017-12-14 MED ORDER — TRAMADOL HCL 50 MG PO TABS: 50.0000 mg | ORAL_TABLET | Freq: Three times a day (TID) | ORAL | 0 refills | Status: AC

## 2017-12-14 NOTE — Telephone Encounter (Signed)
See result note.  

## 2017-12-14 NOTE — Telephone Encounter (Signed)
Pt returned your call and would like a call back .. °

## 2017-12-20 ENCOUNTER — Encounter: Payer: Self-pay | Admitting: Podiatry

## 2017-12-20 ENCOUNTER — Ambulatory Visit (INDEPENDENT_AMBULATORY_CARE_PROVIDER_SITE_OTHER): Payer: BLUE CROSS/BLUE SHIELD | Admitting: Podiatry

## 2017-12-20 DIAGNOSIS — M722 Plantar fascial fibromatosis: Secondary | ICD-10-CM

## 2017-12-20 MED ORDER — CEPHALEXIN 500 MG PO CAPS: 500.0000 mg | ORAL_CAPSULE | Freq: Three times a day (TID) | ORAL | 1 refills | Status: DC

## 2017-12-20 NOTE — Progress Notes (Signed)
Subjective:   Patient ID: [name], female   DOB: 62 y.o.   MRN: [id number]   HPI Patient presents stating of got a little irritation but overall doing well with my surgery with discomfort not in the bottom of the heel but the back of the heel   ROS      Objective:  Physical Exam  Neurovascular status intact muscle strength is adequate range of motion within normal limits negative Homans sign noted with patient found to have well coapted incision sites medial lateral heel with wound edges well coapted and stitches intact     Assessment:  Doing well post endoscopic surgery heel left     Plan:  There is slight redness around the medial incision site with no drainage or other pathology that is precautionary measure I did write her a prescription for cephalexin 500 mg 3 times daily and gave her strict instructions of any drainage were to occur or redness or systemic signs of infection to inform us immediately.  Reappoint 2 weeks for suture removal or earlier if needed

## 2017-12-23 ENCOUNTER — Other Ambulatory Visit: Payer: Self-pay | Admitting: Family Medicine

## 2017-12-25 ENCOUNTER — Telehealth: Payer: Self-pay

## 2017-12-25 NOTE — Telephone Encounter (Signed)
Incoming fax request. Tramadol 50 mg 1 tab TID. Follow up scheduled for 01-18-2014

## 2017-12-26 ENCOUNTER — Telehealth: Payer: Self-pay | Admitting: Gastroenterology

## 2017-12-26 MED ORDER — TRAMADOL HCL 50 MG PO TABS: 50.0000 mg | ORAL_TABLET | Freq: Three times a day (TID) | ORAL | 0 refills | Status: DC

## 2017-12-26 NOTE — Telephone Encounter (Signed)
FYI- Pt. had hydrocodone- acetaminophen 5mg /325mg  filled on 12-04-2017. ( not hydrocortisone)

## 2017-12-26 NOTE — Telephone Encounter (Signed)
Thank you for letting me know- I was unaware of that.  Discontinue tramadol renewal immediately.  Please let her know she cannot take both.  Dr. Tanya NonesPickard should manage the chronic pain control.

## 2017-12-26 NOTE — Telephone Encounter (Signed)
Pt notified and aware to stop Tramadol. [facility]

## 2017-12-26 NOTE — Telephone Encounter (Signed)
I have refilled it.

## 2018-01-01 ENCOUNTER — Other Ambulatory Visit: Payer: Self-pay | Admitting: Family Medicine

## 2018-01-01 MED ORDER — HYDROCODONE-ACETAMINOPHEN 5-325 MG PO TABS: 1.0000 | ORAL_TABLET | Freq: Four times a day (QID) | ORAL | 0 refills | Status: DC

## 2018-01-01 NOTE — Telephone Encounter (Signed)
Refill on hydrocodone to [facility]

## 2018-01-01 NOTE — Telephone Encounter (Signed)
Patient is requesting a refill on Hydrocodone   LOV: 08/22/17  LRF:   12/04/17

## 2018-01-03 ENCOUNTER — Ambulatory Visit (INDEPENDENT_AMBULATORY_CARE_PROVIDER_SITE_OTHER): Payer: BLUE CROSS/BLUE SHIELD | Admitting: Podiatry

## 2018-01-03 ENCOUNTER — Encounter: Payer: Self-pay | Admitting: Podiatry

## 2018-01-03 DIAGNOSIS — M778 Other enthesopathies, not elsewhere classified: Secondary | ICD-10-CM

## 2018-01-03 DIAGNOSIS — M722 Plantar fascial fibromatosis: Secondary | ICD-10-CM | POA: Diagnosis not present

## 2018-01-03 DIAGNOSIS — M779 Enthesopathy, unspecified: Secondary | ICD-10-CM

## 2018-01-03 MED ORDER — TRIAMCINOLONE ACETONIDE 10 MG/ML IJ SUSP
10.0000 mg | Freq: Once | INTRAMUSCULAR | Status: AC
  Administered 2018-01-03: 10 mg

## 2018-01-04 NOTE — Progress Notes (Signed)
Subjective:   Patient ID: [name], female   DOB: 62 y.o.   MRN: [id number]   HPI Patient states doing well with the heel but getting some discomfort on top of the left foot   ROS      Objective:  Physical Exam  Neurovascular status intact with inflammation dorsal left foot within the extensor complex with mild fluid buildup and minimal plantar pain noted     Assessment:  Doing well post fasciitis left with mild extensor tendinitis left     Plan:  Continue stretching exercises for the plantar heel and surgical shoe versus boot usage and today I went ahead did a sterile prep left injected the extensor tendon complex 3 mg Kenalog 5 mg Xylocaine advised on heat ice therapy and reappoint to recheck

## 2018-01-16 ENCOUNTER — Telehealth: Payer: Self-pay | Admitting: Podiatry

## 2018-01-16 NOTE — Telephone Encounter (Signed)
Pt previously had procedure done and now the incision area looks to have fluid in it. Please give pt a call.

## 2018-01-16 NOTE — Telephone Encounter (Signed)
I called pt and she states the incision looks like there is pus and fluid inside when pressed. I offered appt with Tomasa HoseJ. Quintana, RN to evaluate. Transferred to schedulers.

## 2018-01-17 ENCOUNTER — Encounter: Payer: Self-pay | Admitting: Podiatry

## 2018-01-17 ENCOUNTER — Ambulatory Visit (INDEPENDENT_AMBULATORY_CARE_PROVIDER_SITE_OTHER): Payer: Self-pay | Admitting: Podiatry

## 2018-01-17 DIAGNOSIS — L02619 Cutaneous abscess of unspecified foot: Secondary | ICD-10-CM

## 2018-01-17 DIAGNOSIS — L03119 Cellulitis of unspecified part of limb: Secondary | ICD-10-CM

## 2018-01-17 NOTE — Progress Notes (Signed)
Subjective:   Patient ID: [name], female   DOB: 62 y.o.   MRN: [id number]   HPI Patient states overall doing better but getting some irritation of the outside of the left foot where an incision was made in the last several months   ROS      Objective:  Physical Exam  Neurovascular status intact with mild redness around the outside incision but no current drainage with mild swelling around the incision site of the lateral side left foot     Assessment:  May be a small inflammatory area or a very localized abscess but no current drainage or surrounding edema erythema     Plan:  She will monitor this and if it were to start to drain become swollen or become painful where you need to consider antibiotics but at this point I do think it will heal uneventfully and patient will be watched and will be seen back as needed

## 2018-01-18 ENCOUNTER — Encounter: Payer: Self-pay | Admitting: Gastroenterology

## 2018-01-18 ENCOUNTER — Ambulatory Visit (INDEPENDENT_AMBULATORY_CARE_PROVIDER_SITE_OTHER): Payer: BLUE CROSS/BLUE SHIELD | Admitting: Gastroenterology

## 2018-01-18 VITALS — BP 118/64 | HR 70 | Ht 63.0 in | Wt 164.0 lb

## 2018-01-18 DIAGNOSIS — K860 Alcohol-induced chronic pancreatitis: Secondary | ICD-10-CM | POA: Diagnosis not present

## 2018-01-18 DIAGNOSIS — R1010 Upper abdominal pain, unspecified: Secondary | ICD-10-CM | POA: Diagnosis not present

## 2018-01-18 DIAGNOSIS — R110 Nausea: Secondary | ICD-10-CM

## 2018-01-18 NOTE — Progress Notes (Signed)
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  After most recent visit, CT abdomen was ordered with results noted below.  Pseudocysts have resolved, no pancreatic ductal dilatation, there are other radiographic changes of chronic pancreatitis.  I had previously suggested an MRCP to rule out stone in the pancreatic duct that might require endoscopic intervention, but she is not felt she could undergo that due to claustrophobia.  Her primary care provider has been prescribing opiates, thus I discontinued the tramadol. She had weight loss that seemed likely due to restriction of food intake from the pain, but was not having chronic diarrhea.  Symptoms had not significantly improved on pancreatic enzyme supplement.  Lorenda is happy to tell me that she has been feeling better since I last saw her.  Her upper abdominal pain is improved, nausea has decreased and is controlled with medicine.  She has continued opioids, which she primarily takes for right knee pain.  Her appetite has been good and she is very careful about what she eats.  She is also continued pancreatic enzymes and feels they have been helpful.  Discussed her CT report as noted below, and she is reassured.  Weight has stabilized.  ROS: Cardiovascular:  no chest pain Respiratory: no dyspnea Chronic right knee pain from arthritis Leg cramps The patient's Past Medical, Family and Social History were reviewed and are on file in the EMR.  Objective:  Med list reviewed  Current Outpatient Medications:  .  acetaminophen (TYLENOL) 500 MG tablet, Take 1,000 mg by mouth every 6 (six) hours as needed for moderate pain or headache., Disp: , Rfl:  .  ADVAIR DISKUS 250-50 MCG/DOSE AEPB, INHALE 1 PUFF BY MOUTH INTO THE LUNGS EVERY 12 HOURS, Disp: 60 each, Rfl: 5 .  aspirin 81 MG tablet, Take 81 mg by  mouth daily.  , Disp: , Rfl:  .  Biotin w/ Vitamins C & E (HAIR/SKIN/NAILS PO), Take 1 tablet by mouth daily., Disp: , Rfl:  .  bisoprolol (ZEBETA) 5 MG tablet, Take 0.5 tablets (2.5 mg total) by mouth daily. (Patient taking differently: Take 2.5 mg by mouth at bedtime. ), Disp: 90 tablet, Rfl: 3 .  Blood Glucose Monitoring Suppl (BAYER CONTOUR MONITOR) w/Device KIT, CHECK BLOOD SUGAR EVERY DAY, Disp: 1 kit, Rfl: 0 .  clopidogrel (PLAVIX) 75 MG tablet, Take 1 tablet (75 mg total) by mouth daily., Disp: 30 tablet, Rfl: 11 .  CONTOUR NEXT TEST test strip, CHECK BLOOD SUGAR DAILY AS DIRECTED, Disp: 100 each, Rfl: 11 .  CREON 36000 units CPEP capsule, TAKE 2 CAPSULES BY MOUTH THREE TIMES DAILY, BEFORE MEALS, Disp: 180 capsule, Rfl: 0 .  diclofenac sodium (VOLTAREN) 1 % GEL, Apply 2 g topically 4 (four) times daily. (Patient taking differently: Apply 2 g topically 4 (four) times daily as needed (for hand pain). ), Disp: 100 g, Rfl: 0 .  ferrous sulfate 325 (65 FE) MG tablet, Take 325 mg by mouth daily with breakfast., Disp: , Rfl:  .  Fluticasone-Salmeterol (ADVAIR) 250-50 MCG/DOSE AEPB, Inhale 1 puff into the lungs 2 (two) times daily., Disp: , Rfl:  .  HYDROcodone-acetaminophen (NORCO/VICODIN) 5-325 MG tablet, Take 1 tablet by mouth 4 (four) times daily., Disp: 120 tablet, Rfl: 0 .  isosorbide mononitrate (IMDUR) 60 MG 24 hr tablet, TAKE 1 TABLET BY MOUTH DAILY (Patient taking differently: Take 60 mg  by mouth daily. ), Disp: 90 tablet, Rfl: 2 .  metFORMIN (GLUCOPHAGE) 1000 MG tablet, TAKE 1 TABLET BY MOUTH TWICE DAILY WITH MEALS, Disp: 180 tablet, Rfl: 0 .  methocarbamol (ROBAXIN) 500 MG tablet, TAKE 1 TABLET BY MOUTH EVERY 6 HOURS AS NEEDED FOR MUSCLE SPASMS, Disp: 120 tablet, Rfl: 2 .  Multiple Vitamin (MULTIVITAMIN) tablet, Take 1 tablet by mouth daily.  , Disp: , Rfl:  .  ondansetron (ZOFRAN) 4 MG tablet, TAKE 1 TABLET(4 MG) BY MOUTH EVERY 8 HOURS AS NEEDED FOR NAUSEA OR VOMITING (Patient taking  differently: Take 4 mg by mouth every 8 (eight) hours as needed for nausea or vomiting. ), Disp: 30 tablet, Rfl: 2 .  pantoprazole (PROTONIX) 40 MG tablet, TAKE 1 TABLET BY MOUTH DAILY (Patient taking differently: Take 40 mg by mouth daily. ), Disp: 90 tablet, Rfl: 3 .  pioglitazone (ACTOS) 30 MG tablet, Take 1 tablet (30 mg total) by mouth daily., Disp: 90 tablet, Rfl: 3 .  Probiotic Product (TRUBIOTICS PO), Take 1 capsule by mouth daily., Disp: , Rfl:  .  rosuvastatin (CRESTOR) 40 MG tablet, Take 1 tablet (40 mg total) by mouth at bedtime., Disp: 90 tablet, Rfl: 3 .  traMADol (ULTRAM) 50 MG tablet, Take 1 tablet (50 mg total) by mouth 3 (three) times daily before meals., Disp: 60 tablet, Rfl: 0 .  VENTOLIN HFA 108 (90 Base) MCG/ACT inhaler, INHALE 2 PUFFS BY MOUTH TWICE DAILY AS NEEDED FOR WHEEZING OR SHORTNESS OF BREATH (Patient taking differently: Inhale 1 puff into the lungs 2 (two) times daily as needed for wheezing or shortness of breath. ), Disp: 18 g, Rfl: 3   Vital signs in last 24 hrs: Vitals:   01/18/18 1016  BP: 118/64  Pulse: 70  weight stable sat 164#   Physical Exam  Her affect is improved last visit, she is clearly feeling better.  HEENT: sclera anicteric, oral mucosa moist without lesions  Neck: supple, no thyromegaly, JVD or lymphadenopathy  Cardiac: RRR without murmurs, S1S2 heard, no peripheral edema  Pulm: clear to auscultation bilaterally, normal RR and effort noted  Abdomen: soft, no tenderness, with active bowel sounds. No guarding or palpable hepatosplenomegaly.  Skin; warm and dry, no jaundice or rash  Radiologic studies:  12/08/17 CTAP:   CLINICAL DATA:  Upper abdominal pain, nausea, weight loss, alcohol induced chronic pancreatitis   EXAM: CT ABDOMEN WITHOUT AND WITH CONTRAST   TECHNIQUE: Multidetector CT imaging of the abdomen was performed following the standard protocol before and following the bolus administration of intravenous  contrast.   CONTRAST:  173m ISOVUE-300 IOPAMIDOL (ISOVUE-300) INJECTION 61%   COMPARISON:  07/14/2017   FINDINGS: Lower chest: Lung bases are clear.   Hepatobiliary: Liver is within normal limits.   Gallbladder is unremarkable. No intrahepatic or extrahepatic ductal dilatation.   Pancreas: Coarse parenchymal calcifications in the pancreatic head/uncinate process, reflecting sequela of prior/chronic pancreatitis. No pancreatic atrophy, mass, or ductal dilatation. No peripancreatic inflammatory changes to suggest acute pancreatitis. No peripancreatic fluid collections/pseudocysts.   Spleen: Within normal limits.   Adrenals/Urinary Tract: Adrenal glands are within normal limits. Small bilateral renal cysts measuring up to 10 mm on the left. No hydronephrosis.   Stomach/Bowel: Stomach is within normal limits.   Visualized bowel is unremarkable.   Vascular/Lymphatic: No evidence of abdominal aortic aneurysm.   Atherosclerotic calcifications of the abdominal aorta and branch vessels.   No suspicious abdominal lymphadenopathy.   Other: No abdominal ascites.   Prior pseudocysts along the  anterior abdominal mesentery have resolved.   Musculoskeletal: Mild degenerative changes of the lower lumbar spine.   IMPRESSION: Sequela of prior/chronic pancreatitis, as above.   Prior pseudocysts along the anterior abdominal mesentery have resolved.     Electronically Signed   By: Julian Hy M.D.   On: 12/09/2017 08:11   @ASSESSMENTPLANBEGIN @ Assessment: Encounter Diagnoses  Name Primary?  . 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 Upper abdominal pain   . Nausea without vomiting    It took several months after the episode of acute on chronic pancreatitis when she was hospitalized in late May, but she has finally turned the corner with symptom improvement, decrease in pain and nausea.  Her appetite has improved and weight stabilized.  I believe the  pancreatic enzymes have been helpful, and imaging shows that the peripancreatic inflammation and pseudocyst have completely resolved.  There is no pancreatic ductal dilatation to suggest stricture or stone in the PD.   Plan:  Continue Creon at current dose indefinitely. Pain control is managed by primary care provider. Zofran as needed for nausea. See me in a year or sooner as needed.  Total time 20 minutes, over half spent face-to-face with patient in counseling and coordination of care.   Nelida Meuse III

## 2018-01-18 NOTE — Patient Instructions (Signed)
If you are age 60 or older, your body mass index should be between 23-30. Your Body mass index is 29.05 kg/m. If this is out of the aforementioned range listed, please consider follow up with your Primary Care Provider.  If you are age 60 or younger, your body mass index should be between 19-25. Your Body mass index is 29.05 kg/m. If this is out of the aformentioned range listed, please consider follow up with your Primary Care Provider.   Follow up in one year or sooner as needed.   It was a pleasure to see you today!  Dr. Myrtie Neitheranis

## 2018-01-25 ENCOUNTER — Ambulatory Visit: Payer: BLUE CROSS/BLUE SHIELD | Admitting: Family Medicine

## 2018-01-25 ENCOUNTER — Encounter: Payer: Self-pay | Admitting: Family Medicine

## 2018-01-25 ENCOUNTER — Telehealth: Payer: Self-pay | Admitting: Family Medicine

## 2018-01-25 ENCOUNTER — Other Ambulatory Visit: Payer: Self-pay

## 2018-01-25 VITALS — BP 128/68 | HR 68 | Temp 98.1°F | Resp 14 | Ht 63.0 in | Wt 167.0 lb

## 2018-01-25 DIAGNOSIS — I10 Essential (primary) hypertension: Secondary | ICD-10-CM

## 2018-01-25 DIAGNOSIS — E119 Type 2 diabetes mellitus without complications: Secondary | ICD-10-CM

## 2018-01-25 DIAGNOSIS — I2510 Atherosclerotic heart disease of native coronary artery without angina pectoris: Secondary | ICD-10-CM | POA: Diagnosis not present

## 2018-01-25 DIAGNOSIS — K861 Other chronic pancreatitis: Secondary | ICD-10-CM

## 2018-01-25 MED ORDER — HYDROCODONE-ACETAMINOPHEN 5-325 MG PO TABS: 1.0000 | ORAL_TABLET | Freq: Four times a day (QID) | ORAL | 0 refills | Status: DC

## 2018-01-25 NOTE — Telephone Encounter (Signed)
Patient needs her hydrocodone sent to [facility]  [phone]

## 2018-01-25 NOTE — Progress Notes (Signed)
Subjective:    Patient ID: [name], female    DOB: [birth year], 62 y.o.   MRN: [id number]  HPI  Patient was recently discharged from the hospital after being treated for chronic pancreatitis.  I have copied relevant portions of the discharge summary and included them below for my reference:  Admit date: 06/26/2017 Discharge date: 06/30/2017  Admitted From home Disposition: home  Recommendations for Outpatient Follow-up:  1. Follow up with PCP in 1-2 weeks 2. Please obtain BMP/CBC in one week  Home Health none Equipment/Devices: None Discharge Condition: Stable  CODE STATUS full Diet recommendation carb modified Brief/Interim Summary::62 y.o.Fwith COPD not on home O2, CAD ,DM, HTN, and chronic pancreatitis who presents with epigastric pain   Discharge Diagnoses:  Principal Problem:   Acute on chronic pancreatitis ([place]) Active Problems:   TOBACCO ABUSE   Essential hypertension   PAD (peripheral artery disease) (HCC)   GERD (gastroesophageal reflux disease)   Renal artery stenosis (HCC)   CAD (coronary artery disease)   Diabetes mellitus with circulatory complication (HCC)  Acute on chronic pancreatitis-patient was treated with n.p.o. IV hydration and pain control.  Her symptoms improved with the above treatment.  He was started on clear liquid diet diet was advanced to regular diet she was able to tolerate the regular diet on the day of discharge.   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 This was discussed with Radiology who felt the significance was unclear, but that: -Pancreatic protocol CT in 3-6 months to reimage would not be unreasonable in this patient with risk factors (age, smoking, chronic pancreatitis).  Coronary disease Hypertension -Hold home lisinopril, BB, HCTZ -Continue Imdur -Resume aspirin, Plavix, statin  Diabetes -Hold metformin and glimepiride and  pioglitazone -SSI with meals  Tobacco use Patient states she has been off tobacco for 8 years  COPD  No active disease. -Continue Advair as a formulary alternative Dulera  07/07/17 Patient is here today for follow-up.  She states that she still has some mid abdominal tenderness.  She is able to eat and keep down solid food however she is experiencing early satiety.  She will eat 2 or 3 bites and feel completely full with no desire to eat more.  She is drinking very little.  She denies any fevers or chills.  She denies any chest pain.  She denies any trouble breathing.  She denies any nausea or vomiting.  She denies any melena or hematochezia.  Review of her lab work from the hospital shows a drop in her hemoglobin from 12-10 which was likely due to dehydration and aggressive IV fluid replacement.  This is due to be followed up.  She states that her blood sugars are 130s in the morning fasting.  2-hour postprandial sugars are under 200.  At that time, my plan was: Patient seems to be improving.  I have recommended rechecking her in 3 to 4 weeks to ensure complete and total resolution and that she returns back to her baseline.  I would repeat a CT scan of the abdomen and pelvis in 3 months to ensure resolution of the peripancreatic cyst/fluid collection seen on CT.  I reviewed the CT findings with the patient and showed her the fluid collection on CT and explained the necessity for follow-up to rule out underlying pancreatic malignancy.  Hemoglobin A1c was found to be 6.7 prior to hospitalization and well-controlled.  I will repeat a CBC to ensure that the  drop in hemoglobin was simply due to rehydration and that there is no persistent drop.  I will also repeat a CMP to monitor her renal function and potassium given the fact she is taking very little oral intake due to early satiety.  I encouraged 3 to 4 glasses of water a day and gradually increasing her diet as tolerated.  Recheck sooner if  worsening  07/14/17 Patient's pain is worsening.  She rates her pain at a 9 to a 10 on a scale of 10.  She states that the pain is a bandlike sensation around her epigastric area that radiates directly into her back.  She is only eating clear liquids and broth and is unable to tolerate any oral intake due to nausea.  As soon as she eats she developed severe pain.  She denies any fevers or chills.  She denies any diarrhea.  She denies any hematemesis, hematochezia, melena.  She denies any fevers.  She denies any shortness of breath or cough.  She is using her pain medication 2 tablets every 4-6 hours without any relief.  At that time, my plan was: Patient appears to have acute on chronic pancreatitis.  She is on clear liquids and still unable to control her pain.  Therefore I do not see how we can manage this as an outpatient without making her n.p.o. and allowing complete and total bowel rest.  She is unable to do this at home due to dehydration and nutrition requirements.  Therefore I believe she needs to be admitted to the hospital for IV fluid and possibly parenteral nutrition until resolution of the pancreatitis.  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  Therefore I recommended that she go to the emergency room for evaluation and treatment.  08/22/17 Patient was readmitted and a follow-up CT was obtained of the abdomen and pelvis on May 31 in the hospital.  I have copied relevant portions of the CT scan below: IMPRESSION: 1. The developing masslike regions in the mesentery anterior and inferior to the pancreas are likely sequela of recent pancreatitis. They demonstrate mixed attenuation and I suspect they represent developing thick fluid collections. There is some mild surrounding stranding remaining. 2. There is a 18 x 10 mm cyst in the pancreatic head which is larger in the interval, likely sequela of recent  pancreatitis. 3. A 13 mm mass off the upper pole the right kidney is likely a hyperdense cyst but nonspecific. Recommend correlation with ultrasound for confirmation of its cystic nature. 4. Probable mild persistent inflammation of the distal second portion of the duodenum from recent pancreatitis. 5. Atherosclerosis in the aorta.  Gastroenterology was consulted in the hospital and the patient was started on pancreatic enzymes, Creon, 36,000 units/tab., 2 tablets with meals.  She states that this is helped substantially.  It has improved her pain.  She denies any diarrhea, abdominal bloating, vomiting.  Although if she eats a fatty meal, she still has a lot of nausea and pain.  She is also having to take her pain medicine on a scheduled basis 3-4 times a day to help manage the pain and eating food certainly exacerbates it however the pain is now manageable.  Scheduled to see the gastroenterologist tomorrow.  During her most recent hospitalization, she developed profound anemia with her hemoglobin reaching a nadir of 9.6.  She is currently on iron supplementation and is due to recheck her hemoglobin.  Due to her poor p.o. intake, her pioglitazone and glimepiride were discontinued in the hospital.  She states that her blood sugars are now greater than 180.  She is only on metformin. At that time, my plan was: Chronic abdominal pain and pancreatic insufficiency secondary to chronic pancreatitis-I would increase Creon to 3 capsules with each meal.  She can continue hydrocodone 5/325 1 tablet 4 times a day as planned.  We may need to increase the medication due to habituation and dependency to better control her pain in the future  Regarding her anemia, I will recheck a CBC today.  She is on iron replacement.  If her hemoglobin is not improving, we may need to work-up her anemia further including FIT testing and obtaining an anemia panel and considering IV iron replacement if necessary.  Regarding her  diabetes, I will repeat a hemoglobin A1c and if worsening, I will likely resume Actos 30 mg a day to avoid increased risk of pancreatitis by using a sulfonylurea, a DPP 4 inhibitor, or victoza.   01/25/18 Patient's hemoglobin A1c in July was 6.6 and therefore no changes were made.  She is here today for her regular follow-up.  Overall patient has been doing extremely well.  From a diabetic standpoint, she denies any polyuria, polydipsia, or blurry vision.  From a cardiovascular standpoint she denies any chest pain shortness of breath or dyspnea on exertion.  She continues to have chronic abdominal pain however this is been relatively well managed recently.  She dates that she has not had as much nausea vomiting or abdominal discomfort since starting Creon 3 tablets with each meal.  She does have chronic nausea but is been manageable.  She denies any epigastric abdominal pain today on exam.  She denies any myalgias or right upper quadrant pain on her statin.  She is overdue for a Pap smear but she declines this today.  She prefers to wait and schedule this at a different time.  Immunizations are up-to-date except for her flu shot.  However she adamantly declines a flu shot based on a previous bad reaction that she had  Past Medical History:  Diagnosis Date  . Asthma with COPD ([place])    PATIENT DENIES  . CAD (coronary artery disease)    a. [place] 2011: Diffuse distal and branch vessel disease - patient managed medically, no interventional options. b. [place] 03/2014 - low risk, no ischemia.  . Complication of anesthesia    slow to wake up with last surgery in 2011   . Diabetes mellitus with circulatory complication ([place])   . Dyslipidemia   . Headache    hx of migraines   . Hypertension   . Osteoarthritis    severe right knee  R TKR  . PAD (peripheral artery disease) ([place])    a. Severe stenosis mid right SFA s/p atherectomy 03/25/09. b. peripheral angiography in 12/2011 which showed only about 50% diffuse  right SFA stenosis.  . Pancreatitis 2003  . Renal artery stenosis ([place])    a. [place] 2011 - 40-50% left RAS.  . Tobacco use disorder    quit 11/10   Past Surgical History:  Procedure Laterality Date  . ABDOMINAL AORTAGRAM N/A 12/21/2011   Procedure: ABDOMINAL Maxcine Ham;  Surgeon: Wellington Hampshire, MD;  Location: [facility];  Service: Cardiovascular;  Laterality: N/A;  . CARDIAC CATHETERIZATION    . ESOPHAGOGASTRODUODENOSCOPY (EGD) WITH PROPOFOL N/A 10/05/2017   Procedure: ESOPHAGOGASTRODUODENOSCOPY (EGD) WITH PROPOFOL;  Surgeon: Milus Banister, MD;  Location: [facility];  Service: Endoscopy;  Laterality: N/A;  . EUS N/A 10/05/2017   Procedure: UPPER ENDOSCOPIC ULTRASOUND (EUS) RADIAL;  Surgeon: Milus Banister, MD;  Location: [facility];  Service: Endoscopy;  Laterality: N/A;  . FOOT SURGERY Left   . LOWER EXTREMITY ANGIOGRAM Bilateral 05/27/2015   Procedure: Lower Extremity Angiogram;  Surgeon: Wellington Hampshire, MD;  Location: [facility];  Service: Cardiovascular;  Laterality: Bilateral;  . PERIPHERAL VASCULAR CATHETERIZATION N/A 05/27/2015   Procedure: Abdominal Aortogram;  Surgeon: Wellington Hampshire, MD;  Location: [facility];  Service: Cardiovascular;  Laterality: N/A;  . PERIPHERAL VASCULAR CATHETERIZATION Right 05/27/2015   Procedure: Peripheral Vascular Intervention;  Surgeon: Wellington Hampshire, MD;  Location: [facility];  Service: Cardiovascular;  Laterality: Right;  SFA  . TOTAL KNEE ARTHROPLASTY Right 2011  . TOTAL KNEE ARTHROPLASTY Left 05/22/2014   Procedure: LEFT TOTAL KNEE ARTHROPLASTY;  Surgeon: Latanya Maudlin, MD;  Location: [facility];  Service: Orthopedics;  Laterality: Left;  . TUBAL LIGATION  1980   Current Outpatient Medications on File Prior to Visit  Medication Sig Dispense Refill  . acetaminophen (TYLENOL) 500 MG tablet Take 1,000 mg by mouth every 6 (six) hours as needed for moderate pain or headache.    . ADVAIR DISKUS 250-50 MCG/DOSE AEPB  INHALE 1 PUFF BY MOUTH INTO THE LUNGS EVERY 12 HOURS 60 each 5  . aspirin 81 MG tablet Take 81 mg by mouth daily.      . Biotin w/ Vitamins C & E (HAIR/SKIN/NAILS PO) Take 1 tablet by mouth daily.    . bisoprolol (ZEBETA) 5 MG tablet Take 0.5 tablets (2.5 mg total) by mouth daily. (Patient taking differently: Take 2.5 mg by mouth at bedtime. ) 90 tablet 3  . Blood Glucose Monitoring Suppl (BAYER CONTOUR MONITOR) w/Device KIT CHECK BLOOD SUGAR EVERY DAY 1 kit 0  . clopidogrel (PLAVIX) 75 MG tablet Take 1 tablet (75 mg total) by mouth daily. 30 tablet 11  . CONTOUR NEXT TEST test strip CHECK BLOOD SUGAR DAILY AS DIRECTED 100 each 11  . CREON 36000 units CPEP capsule TAKE 2 CAPSULES BY MOUTH THREE TIMES DAILY, BEFORE MEALS 180 capsule 0  . diclofenac sodium (VOLTAREN) 1 % GEL Apply 2 g topically 4 (four) times daily. (Patient taking differently: Apply 2 g topically 4 (four) times daily as needed (for hand pain). ) 100 g 0  . HYDROcodone-acetaminophen (NORCO/VICODIN) 5-325 MG tablet Take 1 tablet by mouth 4 (four) times daily. 120 tablet 0  . isosorbide mononitrate (IMDUR) 60 MG 24 hr tablet TAKE 1 TABLET BY MOUTH DAILY (Patient taking differently: Take 60 mg by mouth daily. ) 90 tablet 2  . metFORMIN (GLUCOPHAGE) 1000 MG tablet TAKE 1 TABLET BY MOUTH TWICE DAILY WITH MEALS 180 tablet 0  . methocarbamol (ROBAXIN) 500 MG tablet TAKE 1 TABLET BY MOUTH EVERY 6 HOURS AS NEEDED FOR MUSCLE SPASMS 120 tablet 2  . Multiple Vitamin (MULTIVITAMIN) tablet Take 1 tablet by mouth daily.      . ondansetron (ZOFRAN) 4 MG tablet TAKE 1 TABLET(4 MG) BY MOUTH EVERY 8 HOURS AS NEEDED FOR NAUSEA OR VOMITING (Patient taking differently: Take 4 mg by mouth every 8 (eight) hours as needed for nausea or vomiting. ) 30 tablet 2  . pantoprazole (PROTONIX) 40 MG tablet TAKE 1 TABLET BY MOUTH DAILY (Patient taking differently: Take 40 mg by mouth daily. ) 90 tablet 3  . pioglitazone (ACTOS) 30 MG tablet Take  1 tablet (30 mg total)  by mouth daily. 90 tablet 3  . Probiotic Product (TRUBIOTICS PO) Take 1 capsule by mouth daily.    . rosuvastatin (CRESTOR) 40 MG tablet Take 1 tablet (40 mg total) by mouth at bedtime. 90 tablet 3  . traMADol (ULTRAM) 50 MG tablet Take 1 tablet (50 mg total) by mouth 3 (three) times daily before meals. 60 tablet 0  . VENTOLIN HFA 108 (90 Base) MCG/ACT inhaler INHALE 2 PUFFS BY MOUTH TWICE DAILY AS NEEDED FOR WHEEZING OR SHORTNESS OF BREATH (Patient taking differently: Inhale 1 puff into the lungs 2 (two) times daily as needed for wheezing or shortness of breath. ) 18 g 3   No current facility-administered medications on file prior to visit.    Allergies  Allergen Reactions  . Gabapentin Other (See Comments)    Brain shakes  . Glucotrol [Glipizide] Rash and Other (See Comments)    Red rash  . Influenza Vaccines Other (See Comments)    Significant arm soreness requiring 1 year of physical therapy  . Latex Rash  . Milk-Related Compounds Rash and Other (See Comments)    Fever  . Nortriptyline Other (See Comments)    BRAIN SHAKE  . Tetanus Toxoid Hives, Swelling, Rash and Other (See Comments)    Swelling to site of injection with rash and fever  . Invokana [Canagliflozin] Other (See Comments)    Yeast Infections   Social History   Socioeconomic History  . Marital status: Married    Spouse name: Not on file  . Number of children: Not on file  . Years of education: 5  . Highest education level: Not on file  Occupational History  . Not on file  Social Needs  . Financial resource strain: Not on file  . Food insecurity:    Worry: Not on file    Inability: Not on file  . Transportation needs:    Medical: Not on file    Non-medical: Not on file  Tobacco Use  . Smoking status: Former Smoker    Last attempt to quit: 12/13/2008    Years since quitting: 9.1  . Smokeless tobacco: Never Used  Substance and Sexual Activity  . Alcohol use: No    Comment: Quit drinking around year  2000  . Drug use: No    Comment: Patient quit smoking around 2011  . Sexual activity: Yes    Partners: Male  Lifestyle  . Physical activity:    Days per week: Not on file    Minutes per session: Not on file  . Stress: Not on file  Relationships  . Social connections:    Talks on phone: Not on file    Gets together: Not on file    Attends religious service: Not on file    Active member of club or organization: Not on file    Attends meetings of clubs or organizations: Not on file    Relationship status: Not on file  . Intimate partner violence:    Fear of current or ex partner: Not on file    Emotionally abused: Not on file    Physically abused: Not on file    Forced sexual activity: Not on file  Other Topics Concern  . Not on file  Social History Narrative   Denies any IV drug use or marijuana use.  Former smoker for 30 years, quit Nov. 2010. She is married with 2 children. Disabled from knee, not working.     Review  of Systems  All other systems reviewed and are negative.      Objective:   Physical Exam Vitals signs reviewed.  Constitutional:      General: She is not in acute distress.    Appearance: She is well-developed. She is not diaphoretic.  Neck:     Musculoskeletal: Normal range of motion and neck supple.     Thyroid: No thyromegaly.     Vascular: No JVD.  Cardiovascular:     Rate and Rhythm: Normal rate and regular rhythm.     Heart sounds: Normal heart sounds. No murmur. No friction rub. No gallop.   Pulmonary:     Effort: Pulmonary effort is normal. No respiratory distress.     Breath sounds: Normal breath sounds. No stridor. No wheezing or rales.  Abdominal:     General: Bowel sounds are normal. There is no distension.     Palpations: Abdomen is soft. There is no mass.     Tenderness: There is no abdominal tenderness. There is no guarding or rebound.     Hernia: No hernia is present.  Lymphadenopathy:     Cervical: No cervical adenopathy.            Assessment & Plan:  Type 2 diabetes mellitus without complication, without long-term current use of insulin (HCC) - Plan: Hemoglobin A1c, CBC with Differential/Platelet, COMPLETE METABOLIC PANEL WITH GFR, Lipid panel, Microalbumin, urine  Benign essential HTN  Idiopathic chronic pancreatitis ([place])  Atherosclerosis of native coronary artery of native heart without angina pectoris  Diabetes has been well managed this summer.  I will repeat a hemoglobin A1c.  If hemoglobin A1c is less than 7 I would make no additional changes.  I will check a fasting lipid panel.  Goal LDL cholesterol is less than 70 given her history of coronary artery disease and peripheral vascular disease.  She is due to see her cardiologist later this month.  I will check a urine microalbumin.  I will also monitor the patient's renal function with a CMP.  Her blood pressure today is well controlled at 128/68.  Currently her chronic pancreatitis and chronic exocrine pancreatic insufficiency are well managed.  Her abdominal pain has been stable on her present dose of codon.  Her nausea and vomiting and early satiety have been well controlled since the addition of Creon 3 tablets with meals.  Therefore I will make no changes at this at the present time.  I did recommend a flu shot which she politely declined.  Also recommended a Pap smear which she politely declined

## 2018-01-26 LAB — COMPLETE METABOLIC PANEL WITH GFR
AG Ratio: 1.8 (calc) (ref 1.0–2.5)
ALBUMIN MSPROF: 4.4 g/dL (ref 3.6–5.1)
ALT: 14 U/L (ref 6–29)
AST: 16 U/L (ref 10–35)
Alkaline phosphatase (APISO): 83 U/L (ref 33–130)
BUN / CREAT RATIO: 20 (calc) (ref 6–22)
BUN: 21 mg/dL (ref 7–25)
CALCIUM: 9.6 mg/dL (ref 8.6–10.4)
CO2: 23 mmol/L (ref 20–32)
CREATININE: 1.06 mg/dL — AB (ref 0.50–0.99)
Chloride: 104 mmol/L (ref 98–110)
GFR, EST AFRICAN AMERICAN: 66 mL/min/{1.73_m2} (ref >=60)
GFR, Est Non African American: 57 mL/min/{1.73_m2} — ABNORMAL LOW (ref >=60)
GLUCOSE: 90 mg/dL (ref 65–99)
Globulin: 2.4 g/dL (calc) (ref 1.9–3.7)
Potassium: 4.1 mmol/L (ref 3.5–5.3)
Sodium: 140 mmol/L (ref 135–146)
TOTAL PROTEIN: 6.8 g/dL (ref 6.1–8.1)
Total Bilirubin: 0.3 mg/dL (ref 0.2–1.2)

## 2018-01-26 LAB — MICROALBUMIN, URINE: Microalb, Ur: 4.3 mg/dL

## 2018-01-26 LAB — CBC WITH DIFFERENTIAL/PLATELET
BASOS ABS: 51 {cells}/uL (ref 0–200)
BASOS PCT: 0.9 %
Eosinophils Absolute: 251 cells/uL (ref 15–500)
Eosinophils Relative: 4.4 %
HCT: 39.7 % (ref 35.0–45.0)
Hemoglobin: 12.7 g/dL (ref 11.7–15.5)
LYMPHS ABS: 1984 {cells}/uL (ref 850–3900)
MCH: 27.9 pg (ref 27.0–33.0)
MCHC: 32 g/dL (ref 32.0–36.0)
MCV: 87.1 fL (ref 80.0–100.0)
MONOS PCT: 7.8 %
MPV: 9.7 fL (ref 7.5–12.5)
NEUTROS ABS: 2970 {cells}/uL (ref 1500–7800)
NEUTROS PCT: 52.1 %
Platelets: 269 10*3/uL (ref 140–400)
RBC: 4.56 10*6/uL (ref 3.80–5.10)
RDW: 14.5 % (ref 11.0–15.0)
TOTAL LYMPHOCYTE: 34.8 %
WBC: 5.7 10*3/uL (ref 3.8–10.8)
WBCMIX: 445 {cells}/uL (ref 200–950)

## 2018-01-26 LAB — LIPID PANEL
CHOL/HDL RATIO: 2.8 (calc) (ref <5.0)
CHOLESTEROL: 143 mg/dL (ref <200)
HDL: 52 mg/dL (ref >50)
LDL CHOLESTEROL (CALC): 68 mg/dL
Non-HDL Cholesterol (Calc): 91 mg/dL (calc) (ref <130)
TRIGLYCERIDES: 142 mg/dL (ref <150)

## 2018-01-26 LAB — HEMOGLOBIN A1C
Hgb A1c MFr Bld: 6.7 % of total Hgb — ABNORMAL HIGH (ref <5.7)
MEAN PLASMA GLUCOSE: 146 (calc)
eAG (mmol/L): 8.1 (calc)

## 2018-02-04 ENCOUNTER — Other Ambulatory Visit: Payer: Self-pay | Admitting: Family Medicine

## 2018-02-12 ENCOUNTER — Other Ambulatory Visit: Payer: Self-pay | Admitting: Family Medicine

## 2018-02-19 ENCOUNTER — Other Ambulatory Visit: Payer: Self-pay | Admitting: Family Medicine

## 2018-02-19 NOTE — Telephone Encounter (Signed)
Due 02/23/2018

## 2018-02-19 NOTE — Telephone Encounter (Signed)
Refill on hydrocodone to [facility]

## 2018-02-21 ENCOUNTER — Other Ambulatory Visit: Payer: Self-pay | Admitting: Family Medicine

## 2018-02-21 DIAGNOSIS — I10 Essential (primary) hypertension: Secondary | ICD-10-CM

## 2018-02-21 NOTE — Telephone Encounter (Signed)
Ok to refill??  Last office visit/  refill 01/25/2018. 

## 2018-02-22 MED ORDER — HYDROCODONE-ACETAMINOPHEN 5-325 MG PO TABS: 1.0000 | ORAL_TABLET | Freq: Four times a day (QID) | ORAL | 0 refills | Status: DC

## 2018-03-11 ENCOUNTER — Other Ambulatory Visit: Payer: Self-pay | Admitting: Family Medicine

## 2018-03-11 DIAGNOSIS — K861 Other chronic pancreatitis: Secondary | ICD-10-CM

## 2018-03-12 ENCOUNTER — Other Ambulatory Visit: Payer: Self-pay | Admitting: Family Medicine

## 2018-03-19 DIAGNOSIS — G894 Chronic pain syndrome: Secondary | ICD-10-CM | POA: Insufficient documentation

## 2018-03-21 ENCOUNTER — Other Ambulatory Visit: Payer: Self-pay | Admitting: Family Medicine

## 2018-03-22 ENCOUNTER — Encounter (HOSPITAL_COMMUNITY): Payer: BLUE CROSS/BLUE SHIELD

## 2018-03-25 ENCOUNTER — Other Ambulatory Visit: Payer: Self-pay | Admitting: Family Medicine

## 2018-05-06 ENCOUNTER — Other Ambulatory Visit: Payer: Self-pay | Admitting: Family Medicine

## 2018-05-07 NOTE — Telephone Encounter (Signed)
Requesting refill    Robaxin  LOV: 01/25/18  LRF: 02/05/18

## 2018-06-08 ENCOUNTER — Other Ambulatory Visit: Payer: Self-pay | Admitting: Family Medicine

## 2018-06-08 DIAGNOSIS — K861 Other chronic pancreatitis: Secondary | ICD-10-CM

## 2018-09-18 ENCOUNTER — Other Ambulatory Visit: Payer: Self-pay | Admitting: Family Medicine

## 2018-09-27 ENCOUNTER — Emergency Department (HOSPITAL_COMMUNITY): Payer: BLUE CROSS/BLUE SHIELD

## 2018-09-27 ENCOUNTER — Other Ambulatory Visit: Payer: Self-pay

## 2018-09-27 ENCOUNTER — Inpatient Hospital Stay (HOSPITAL_COMMUNITY)
Admission: EM | Admit: 2018-09-27 | Discharge: 2018-09-30 | DRG: 440 | Disposition: A | Discharge status: Home / Self Care | Payer: BLUE CROSS/BLUE SHIELD | Attending: Internal Medicine | Admitting: Internal Medicine

## 2018-09-27 DIAGNOSIS — I2510 Atherosclerotic heart disease of native coronary artery without angina pectoris: Secondary | ICD-10-CM | POA: Diagnosis present

## 2018-09-27 DIAGNOSIS — R350 Frequency of micturition: Secondary | ICD-10-CM | POA: Diagnosis present

## 2018-09-27 DIAGNOSIS — Z7902 Long term (current) use of antithrombotics/antiplatelets: Secondary | ICD-10-CM

## 2018-09-27 DIAGNOSIS — Z7951 Long term (current) use of inhaled steroids: Secondary | ICD-10-CM

## 2018-09-27 DIAGNOSIS — K859 Acute pancreatitis without necrosis or infection, unspecified: Secondary | ICD-10-CM | POA: Diagnosis not present

## 2018-09-27 DIAGNOSIS — Z7984 Long term (current) use of oral hypoglycemic drugs: Secondary | ICD-10-CM

## 2018-09-27 DIAGNOSIS — E1159 Type 2 diabetes mellitus with other circulatory complications: Secondary | ICD-10-CM | POA: Diagnosis present

## 2018-09-27 DIAGNOSIS — K219 Gastro-esophageal reflux disease without esophagitis: Secondary | ICD-10-CM | POA: Diagnosis present

## 2018-09-27 DIAGNOSIS — J449 Chronic obstructive pulmonary disease, unspecified: Secondary | ICD-10-CM | POA: Diagnosis present

## 2018-09-27 DIAGNOSIS — Z79891 Long term (current) use of opiate analgesic: Secondary | ICD-10-CM

## 2018-09-27 DIAGNOSIS — R112 Nausea with vomiting, unspecified: Secondary | ICD-10-CM

## 2018-09-27 DIAGNOSIS — K861 Other chronic pancreatitis: Secondary | ICD-10-CM | POA: Diagnosis present

## 2018-09-27 DIAGNOSIS — I129 Hypertensive chronic kidney disease with stage 1 through stage 4 chronic kidney disease, or unspecified chronic kidney disease: Secondary | ICD-10-CM | POA: Diagnosis present

## 2018-09-27 DIAGNOSIS — N183 Chronic kidney disease, stage 3 unspecified: Secondary | ICD-10-CM | POA: Diagnosis present

## 2018-09-27 DIAGNOSIS — Z79899 Other long term (current) drug therapy: Secondary | ICD-10-CM

## 2018-09-27 DIAGNOSIS — Z823 Family history of stroke: Secondary | ICD-10-CM

## 2018-09-27 DIAGNOSIS — I10 Essential (primary) hypertension: Secondary | ICD-10-CM | POA: Diagnosis present

## 2018-09-27 DIAGNOSIS — E1122 Type 2 diabetes mellitus with diabetic chronic kidney disease: Secondary | ICD-10-CM | POA: Diagnosis present

## 2018-09-27 DIAGNOSIS — R809 Proteinuria, unspecified: Secondary | ICD-10-CM | POA: Diagnosis present

## 2018-09-27 DIAGNOSIS — I739 Peripheral vascular disease, unspecified: Secondary | ICD-10-CM | POA: Diagnosis present

## 2018-09-27 DIAGNOSIS — K8681 Exocrine pancreatic insufficiency: Secondary | ICD-10-CM | POA: Diagnosis present

## 2018-09-27 DIAGNOSIS — Z7982 Long term (current) use of aspirin: Secondary | ICD-10-CM

## 2018-09-27 DIAGNOSIS — Z20828 Contact with and (suspected) exposure to other viral communicable diseases: Secondary | ICD-10-CM | POA: Diagnosis present

## 2018-09-27 DIAGNOSIS — Z96653 Presence of artificial knee joint, bilateral: Secondary | ICD-10-CM | POA: Diagnosis present

## 2018-09-27 DIAGNOSIS — Z91040 Latex allergy status: Secondary | ICD-10-CM

## 2018-09-27 DIAGNOSIS — K852 Alcohol induced acute pancreatitis without necrosis or infection: Secondary | ICD-10-CM | POA: Diagnosis not present

## 2018-09-27 DIAGNOSIS — Z888 Allergy status to other drugs, medicaments and biological substances status: Secondary | ICD-10-CM

## 2018-09-27 DIAGNOSIS — Z887 Allergy status to serum and vaccine status: Secondary | ICD-10-CM

## 2018-09-27 DIAGNOSIS — I701 Atherosclerosis of renal artery: Secondary | ICD-10-CM | POA: Diagnosis present

## 2018-09-27 DIAGNOSIS — R81 Glycosuria: Secondary | ICD-10-CM | POA: Diagnosis present

## 2018-09-27 DIAGNOSIS — E785 Hyperlipidemia, unspecified: Secondary | ICD-10-CM | POA: Diagnosis present

## 2018-09-27 DIAGNOSIS — E1151 Type 2 diabetes mellitus with diabetic peripheral angiopathy without gangrene: Secondary | ICD-10-CM | POA: Diagnosis present

## 2018-09-27 DIAGNOSIS — Z825 Family history of asthma and other chronic lower respiratory diseases: Secondary | ICD-10-CM

## 2018-09-27 DIAGNOSIS — Z87891 Personal history of nicotine dependence: Secondary | ICD-10-CM

## 2018-09-27 DIAGNOSIS — K860 Alcohol-induced chronic pancreatitis: Secondary | ICD-10-CM | POA: Diagnosis present

## 2018-09-27 DIAGNOSIS — Z9851 Tubal ligation status: Secondary | ICD-10-CM

## 2018-09-27 DIAGNOSIS — I70201 Unspecified atherosclerosis of native arteries of extremities, right leg: Secondary | ICD-10-CM | POA: Diagnosis present

## 2018-09-27 DIAGNOSIS — F1011 Alcohol abuse, in remission: Secondary | ICD-10-CM | POA: Diagnosis present

## 2018-09-27 DIAGNOSIS — R824 Acetonuria: Secondary | ICD-10-CM | POA: Diagnosis present

## 2018-09-27 DIAGNOSIS — J4489 Other specified chronic obstructive pulmonary disease: Secondary | ICD-10-CM | POA: Diagnosis present

## 2018-09-27 DIAGNOSIS — Z803 Family history of malignant neoplasm of breast: Secondary | ICD-10-CM

## 2018-09-27 DIAGNOSIS — R0789 Other chest pain: Secondary | ICD-10-CM | POA: Diagnosis present

## 2018-09-27 LAB — COMPREHENSIVE METABOLIC PANEL
ALT: 22 U/L (ref 0–44)
AST: 25 U/L (ref 15–41)
Albumin: 4.5 g/dL (ref 3.5–5.0)
Alkaline Phosphatase: 64 U/L (ref 38–126)
Anion gap: 14 (ref 5–15)
BUN: 14 mg/dL (ref 8–23)
CO2: 21 mmol/L — ABNORMAL LOW (ref 22–32)
Calcium: 10 mg/dL (ref 8.9–10.3)
Chloride: 103 mmol/L (ref 98–111)
Creatinine, Ser: 1.1 mg/dL — ABNORMAL HIGH (ref 0.44–1.00)
GFR calc Af Amer: >60 mL/min (ref >60)
GFR calc non Af Amer: 54 mL/min — ABNORMAL LOW (ref >60)
Glucose, Bld: 164 mg/dL — ABNORMAL HIGH (ref 70–99)
Potassium: 3.9 mmol/L (ref 3.5–5.1)
Sodium: 138 mmol/L (ref 135–145)
Total Bilirubin: 0.7 mg/dL (ref 0.3–1.2)
Total Protein: 7 g/dL (ref 6.5–8.1)

## 2018-09-27 LAB — CBC
HCT: 45.1 % (ref 36.0–46.0)
Hemoglobin: 14.2 g/dL (ref 12.0–15.0)
MCH: 29.2 pg (ref 26.0–34.0)
MCHC: 31.5 g/dL (ref 30.0–36.0)
MCV: 92.8 fL (ref 80.0–100.0)
Platelets: 273 10*3/uL (ref 150–400)
RBC: 4.86 MIL/uL (ref 3.87–5.11)
RDW: 13.8 % (ref 11.5–15.5)
WBC: 7.8 10*3/uL (ref 4.0–10.5)
nRBC: 0 % (ref 0.0–0.2)

## 2018-09-27 LAB — LIPASE, BLOOD: Lipase: 2354 U/L — ABNORMAL HIGH (ref 11–51)

## 2018-09-27 MED ORDER — SODIUM CHLORIDE 0.9% FLUSH
3.0000 mL | Freq: Once | INTRAVENOUS | Status: AC
  Administered 2018-09-27: 3 mL via INTRAVENOUS

## 2018-09-27 MED ORDER — IOHEXOL 300 MG/ML  SOLN
100.0000 mL | Freq: Once | INTRAMUSCULAR | Status: AC | PRN
  Administered 2018-09-27: 100 mL via INTRAVENOUS

## 2018-09-27 MED ORDER — SODIUM CHLORIDE 0.9 % IV BOLUS
2000.0000 mL | Freq: Once | INTRAVENOUS | Status: AC
  Administered 2018-09-27: 2000 mL via INTRAVENOUS

## 2018-09-27 MED ORDER — MORPHINE SULFATE (PF) 4 MG/ML IV SOLN
4.0000 mg | Freq: Once | INTRAVENOUS | Status: AC
  Administered 2018-09-27: 4 mg via INTRAVENOUS
  Filled 2018-09-27: qty 1

## 2018-09-27 MED ORDER — ONDANSETRON HCL 4 MG/2ML IJ SOLN
4.0000 mg | Freq: Once | INTRAMUSCULAR | Status: AC
  Administered 2018-09-27: 4 mg via INTRAVENOUS
  Filled 2018-09-27: qty 2

## 2018-09-27 NOTE — ED Provider Notes (Signed)
[facility] EMERGENCY DEPARTMENT Provider Note   CSN: [id number] Arrival date & time: 09/27/18  1700    History   Chief Complaint Chief Complaint  Patient presents with  . Abdominal Pain    HPI [name] is a 63 y.o. female with past medical history significant for CAD, DM, HTN, PAD, recurrent pancreatitis presents to emergency department today with chief complaint of epigastric pain x 3 days.  Pain radiates to right upper quadrant and her back.  She states the pain is dull and constant.  She rates the pain 10 of 10 in severity.  She took her home dose of hydrocodone without symptom relief.  She reports associated nausea and vomiting with 7 episodes of nonbloody nonbilious emesis in the last 24 hours. She is also reporting urinary frequency in the last week. She denies fever, chills, chest pain, shortness of breath, hematuria, vaginal discharge, dysuria, diarrhea, sick contacts.  She denies any alcohol use.  Chart review shows she is followed by Dr. Earlean Shawl with [facility].  She was last admitted for pancreatitis in 10/03/2017.   Past Medical History:  Diagnosis Date  . Asthma with COPD ([place])    PATIENT DENIES  . CAD (coronary artery disease)    a. [place] 2011: Diffuse distal and branch vessel disease - patient managed medically, no interventional options. b. [place] 03/2014 - low risk, no ischemia.  . Complication of anesthesia    slow to wake up with last surgery in 2011   . Diabetes mellitus with circulatory complication ([place])   . Dyslipidemia   . Headache    hx of migraines   . Hypertension   . Osteoarthritis    severe right knee  R TKR  . PAD (peripheral artery disease) ([place])    a. Severe stenosis mid right SFA s/p atherectomy 03/25/09. b. peripheral angiography in 12/2011 which showed only about 50% diffuse right SFA stenosis.  . Pancreatitis 2003  . Renal artery stenosis ([place])    a. [place] 2011 - 40-50% left RAS.  . Tobacco use disorder    quit 11/10    Patient  Active Problem List   Diagnosis Date Noted  . Alcohol-induced chronic pancreatitis ([place])   . Renal artery stenosis ([place])   . CAD (coronary artery disease)   . Diabetes mellitus with circulatory complication ([place])   . Dyslipidemia   . History of total knee arthroplasty 05/22/2014  . Cutaneous skin tags 09/09/2012  . Bilateral leg pain 07/20/2012  . Arthritis of knee 10/25/2011  . GERD (gastroesophageal reflux disease) 11/04/2010  . Coronary atherosclerosis 03/02/2009  . PAD (peripheral artery disease) ([place]) 01/30/2009  . Diabetes mellitus, type II ([place]) 01/02/2009  . TOBACCO ABUSE 01/02/2009  . Essential hypertension 01/02/2009    Past Surgical History:  Procedure Laterality Date  . ABDOMINAL AORTAGRAM N/A 12/21/2011   Procedure: ABDOMINAL Maxcine Ham;  Surgeon: Wellington Hampshire, MD;  Location: [facility] CATH LAB;  Service: Cardiovascular;  Laterality: N/A;  . CARDIAC CATHETERIZATION    . ESOPHAGOGASTRODUODENOSCOPY (EGD) WITH PROPOFOL N/A 10/05/2017   Procedure: ESOPHAGOGASTRODUODENOSCOPY (EGD) WITH PROPOFOL;  Surgeon: Milus Banister, MD;  Location: [facility];  Service: Endoscopy;  Laterality: N/A;  . EUS N/A 10/05/2017   Procedure: UPPER ENDOSCOPIC ULTRASOUND (EUS) RADIAL;  Surgeon: Milus Banister, MD;  Location: [facility];  Service: Endoscopy;  Laterality: N/A;  . FOOT SURGERY Left   . LOWER EXTREMITY ANGIOGRAM Bilateral 05/27/2015   Procedure: Lower Extremity Angiogram;  Surgeon: Wellington Hampshire, MD;  Location: [facility] CV LAB;  Service: Cardiovascular;  Laterality: Bilateral;  . PERIPHERAL VASCULAR CATHETERIZATION N/A 05/27/2015   Procedure: Abdominal Aortogram;  Surgeon: Wellington Hampshire, MD;  Location: [facility] CV LAB;  Service: Cardiovascular;  Laterality: N/A;  . PERIPHERAL VASCULAR CATHETERIZATION Right 05/27/2015   Procedure: Peripheral Vascular Intervention;  Surgeon: Wellington Hampshire, MD;  Location: [facility] CV LAB;  Service: Cardiovascular;  Laterality: Right;  SFA   . TOTAL KNEE ARTHROPLASTY Right 2011  . TOTAL KNEE ARTHROPLASTY Left 05/22/2014   Procedure: LEFT TOTAL KNEE ARTHROPLASTY;  Surgeon: Latanya Maudlin, MD;  Location: [facility];  Service: Orthopedics;  Laterality: Left;  . TUBAL LIGATION  1980     OB History   No obstetric history on file.      Home Medications    Prior to Admission medications   Medication Sig Start Date End Date Taking? Authorizing Provider  acetaminophen (TYLENOL) 500 MG tablet Take 1,000 mg by mouth every 6 (six) hours as needed for moderate pain or headache.   Yes [provider]  ADVAIR DISKUS 250-50 MCG/DOSE AEPB INHALE 1 PUFF BY MOUTH INTO THE LUNGS EVERY 12 HOURS Patient taking differently: Inhale 1 puff into the lungs 2 (two) times daily.  01/01/18  Yes Susy Frizzle, MD  aspirin 81 MG tablet Take 81 mg by mouth daily.     Yes [provider]  Biotin w/ Vitamins C & E (HAIR/SKIN/NAILS PO) Take 1 tablet by mouth daily.   Yes [provider]  bisoprolol (ZEBETA) 5 MG tablet TAKE ONE-HALF TABLET BY MOUTH EVERY DAY Patient taking differently: Take 2.5 mg by mouth at bedtime.  09/18/18  Yes Susy Frizzle, MD  Blood Glucose Monitoring Suppl (BAYER CONTOUR MONITOR) w/Device KIT CHECK BLOOD SUGAR EVERY DAY 08/20/15  Yes Susy Frizzle, MD  clopidogrel (PLAVIX) 75 MG tablet Take 1 tablet (75 mg total) by mouth daily. 04/06/17  Yes Wellington Hampshire, MD  CONTOUR NEXT TEST test strip CHECK BLOOD SUGAR DAILY AS DIRECTED 09/14/17  Yes Pickard, Cammie Mcgee, MD  CREON 36000 units CPEP capsule TAKE 2 CAPSULES BY MOUTH THREE TIMES DAILY BEFORE MEALS Patient taking differently: Take 72,000 Units by mouth 3 (three) times daily as needed (when eating a meal).  03/12/18  Yes Susy Frizzle, MD  diclofenac sodium (VOLTAREN) 1 % GEL Apply 2 g topically 4 (four) times daily. Patient taking differently: Apply 2 g topically 4 (four) times daily as needed (for hand pain).  05/04/16  Yes Susy Frizzle, MD   HYDROcodone-acetaminophen (NORCO/VICODIN) 5-325 MG tablet Take 1 tablet by mouth 4 (four) times daily. 02/22/18  Yes Susy Frizzle, MD  isosorbide mononitrate (IMDUR) 60 MG 24 hr tablet TAKE 1 TABLET BY MOUTH DAILY Patient taking differently: Take 60 mg by mouth at bedtime.  02/19/18  Yes Susy Frizzle, MD  metFORMIN (GLUCOPHAGE) 1000 MG tablet TAKE 1 TABLET BY MOUTH TWICE DAILY WITH MEALS 03/26/18  Yes Susy Frizzle, MD  methocarbamol (ROBAXIN) 500 MG tablet TAKE 1 TABLET BY MOUTH EVERY 6 HOURS AS NEEDED FOR MUSCLE SPASMS Patient taking differently: Take 500 mg by mouth every 6 (six) hours as needed for muscle spasms.  05/07/18  Yes Susy Frizzle, MD  Multiple Vitamin (MULTIVITAMIN) tablet Take 1 tablet by mouth daily.     Yes [provider]  nitroGLYCERIN (NITROSTAT) 0.4 MG SL tablet Place 1 tablet under the tongue every 5 (five) minutes as needed for chest pain. 09/26/18  Yes [provider]  ondansetron (ZOFRAN) 4 MG tablet TAKE 1 TABLET BY MOUTH EVERY 8 HOURS AS NEEDED FOR NAUSEA AND VOMITING Patient taking differently: Take 4 mg by mouth every 8 (eight) hours as needed for nausea or vomiting.  06/11/18  Yes Susy Frizzle, MD  pantoprazole (PROTONIX) 40 MG tablet TAKE 1 TABLET BY MOUTH DAILY Patient taking differently: 40 mg.  03/21/18  Yes Susy Frizzle, MD  pioglitazone (ACTOS) 30 MG tablet Take 1 tablet (30 mg total) by mouth daily. 08/25/17  Yes Susy Frizzle, MD  Probiotic Product (TRUBIOTICS PO) Take 1 capsule by mouth daily.   Yes [provider]  rosuvastatin (CRESTOR) 40 MG tablet Take 1 tablet (40 mg total) by mouth at bedtime. 10/17/17  Yes Pickard, Cammie Mcgee, MD  VENTOLIN HFA 108 (90 Base) MCG/ACT inhaler INHALE 2 PUFFS BY MOUTH TWICE DAILY AS NEEDED FOR WHEEZING OR SHORTNESS OF BREATH Patient taking differently: Inhale 1 puff into the lungs 2 (two) times daily as needed for wheezing or shortness of breath.  08/31/17  Yes Susy Frizzle,  MD  traMADol (ULTRAM) 50 MG tablet Take 1 tablet (50 mg total) by mouth 3 (three) times daily before meals. Patient not taking: Reported on 09/27/2018 12/26/17   Doran Stabler, MD    Family History Family History  Problem Relation Age of Onset  . CVA Mother   . Emphysema Father   . Breast cancer Sister 31    Social History Social History   Tobacco Use  . Smoking status: Former Smoker    Quit date: 12/13/2008    Years since quitting: 9.7  . Smokeless tobacco: Never Used  Substance Use Topics  . Alcohol use: No    Comment: Quit drinking around year 2000  . Drug use: No    Comment: Patient quit smoking around 2011     Allergies   Gabapentin, Glucotrol [glipizide], Influenza vaccines, Latex, Milk-related compounds, Nortriptyline, Tetanus toxoid, and Invokana [canagliflozin]   Review of Systems Review of Systems  Constitutional: Negative for chills and fever.  HENT: Negative for congestion, ear discharge, ear pain, sinus pressure, sinus pain and sore throat.   Eyes: Negative for pain and redness.  Respiratory: Negative for cough and shortness of breath.   Cardiovascular: Negative for chest pain.  Gastrointestinal: Positive for abdominal pain, nausea and vomiting. Negative for constipation and diarrhea.  Genitourinary: Positive for frequency. Negative for dysuria, flank pain, hematuria, pelvic pain, vaginal bleeding, vaginal discharge and vaginal pain.  Musculoskeletal: Negative for back pain and neck pain.  Skin: Negative for wound.  Neurological: Negative for weakness, numbness and headaches.     Physical Exam Updated Vital Signs BP (!) 147/84 (BP Location: Left Arm)   Pulse 88   Temp 98.7 F (37.1 C) (Oral)   Resp 18   SpO2 99%   Physical Exam Vitals signs and nursing note reviewed.  Constitutional:      General: She is not in acute distress.    Appearance: She is not ill-appearing.  HENT:     Head: Normocephalic and atraumatic.     Right Ear: Tympanic  membrane and external ear normal.     Left Ear: Tympanic membrane and external ear normal.     Nose: Nose normal.     Mouth/Throat:     Mouth: Mucous membranes are dry.     Pharynx: Oropharynx is clear.  Eyes:     General: No scleral icterus.  Right eye: No discharge.        Left eye: No discharge.     Extraocular Movements: Extraocular movements intact.     Conjunctiva/sclera: Conjunctivae normal.     Pupils: Pupils are equal, round, and reactive to light.  Neck:     Musculoskeletal: Normal range of motion.     Vascular: No JVD.  Cardiovascular:     Rate and Rhythm: Normal rate and regular rhythm.     Pulses: Normal pulses.          Radial pulses are 2+ on the right side and 2+ on the left side.     Heart sounds: Normal heart sounds.  Pulmonary:     Comments: Lungs clear to auscultation in all fields. Symmetric chest rise. No wheezing, rales, or rhonchi. Abdominal:     Tenderness: There is no right CVA tenderness or left CVA tenderness.     Comments: Abdomen is soft, non-distended.  Tenderness to palpation in epigastric area and right upper quadrant.  No rigidity, no guarding. No peritoneal signs.  Musculoskeletal: Normal range of motion.  Skin:    General: Skin is warm and dry.     Capillary Refill: Capillary refill takes less than 2 seconds.  Neurological:     Mental Status: She is oriented to person, place, and time.     GCS: GCS eye subscore is 4. GCS verbal subscore is 5. GCS motor subscore is 6.     Comments: Fluent speech, no facial droop.  Psychiatric:        Behavior: Behavior normal.      ED Treatments / Results  Labs (all labs ordered are listed, but only abnormal results are displayed) Labs Reviewed  LIPASE, BLOOD - Abnormal; Notable for the following components:      Result Value   Lipase 2,354 (*)    All other components within normal limits  COMPREHENSIVE METABOLIC PANEL - Abnormal; Notable for the following components:   CO2 21 (*)    Glucose,  Bld 164 (*)    Creatinine, Ser 1.10 (*)    GFR calc non Af Amer 54 (*)    All other components within normal limits  SARS CORONAVIRUS 2 (HOSPITAL ORDER, [facility])  CBC  URINALYSIS, ROUTINE W REFLEX MICROSCOPIC    EKG None  Radiology No results found.  Procedures Procedures (including critical care time)  Medications Ordered in ED Medications  sodium chloride flush (NS) 0.9 % injection 3 mL (3 mLs Intravenous Given 09/27/18 2315)  sodium chloride 0.9 % bolus 2,000 mL (2,000 mLs Intravenous New Bag/Given 09/27/18 2315)  ondansetron (ZOFRAN) injection 4 mg (4 mg Intravenous Given 09/27/18 2310)  morphine 4 MG/ML injection 4 mg (4 mg Intravenous Given 09/27/18 2342)     Initial Impression / Assessment and Plan / ED Course  I have reviewed the triage vital signs and the nursing notes.  Pertinent labs & imaging results that were available during my care of the patient were reviewed by me and considered in my medical decision making (see chart for details).  Patient is afebrile, nontoxic, nonseptic appearing, in no apparent distress.   Patient does not meet the SIRS or Sepsis criteria.  On exam she has epigastric area tenderness as well as right upper quadrant tenderness, no peritoneal signs. She has dry mucous membranes.  Reviewed labs ordered in triage.  Work-up is significant for lipase of 2354.  CBC and CMP overall unremarkable. Will give 2 L IV fluids, pain  medicine, Zofran.  UA and CT abdomen pelvis pending. On reassessment pain with minimal improvement, 0.48m dilaudid ordered. Nausea has resolved.  [facility] end of my [facility]. Patient presentation, ED course, and plan of care discussed with review of all pertinent labs and imaging. Please see her note for further details regarding further ED course and disposition. Anticipate admission for pancreatitis   This note was prepared using Dragon voice recognition software  and may include unintentional dictation errors due to the inherent limitations of voice recognition software.    Final Clinical Impressions(s) / ED Diagnoses   Final diagnoses:  Acute pancreatitis, unspecified complication status, unspecified pancreatitis type  Intractable vomiting with nausea, unspecified vomiting type    ED Discharge Orders    None       AFlint Melter08/14/20 0024    WRipley Fraise MD 09/28/18 [phone]

## 2018-09-27 NOTE — ED Triage Notes (Signed)
Pt reports RUQ pain x 2-3 days. Pt reports N/V x 1 day. the patient reports hx of pancreatitis, feels like a flare-up.

## 2018-09-28 ENCOUNTER — Encounter (HOSPITAL_COMMUNITY): Payer: Self-pay | Admitting: Family Medicine

## 2018-09-28 DIAGNOSIS — K861 Other chronic pancreatitis: Secondary | ICD-10-CM | POA: Diagnosis not present

## 2018-09-28 DIAGNOSIS — R81 Glycosuria: Secondary | ICD-10-CM | POA: Diagnosis present

## 2018-09-28 DIAGNOSIS — K859 Acute pancreatitis without necrosis or infection, unspecified: Secondary | ICD-10-CM | POA: Diagnosis present

## 2018-09-28 DIAGNOSIS — E1122 Type 2 diabetes mellitus with diabetic chronic kidney disease: Secondary | ICD-10-CM | POA: Diagnosis present

## 2018-09-28 DIAGNOSIS — I10 Essential (primary) hypertension: Secondary | ICD-10-CM

## 2018-09-28 DIAGNOSIS — N183 Chronic kidney disease, stage 3 unspecified: Secondary | ICD-10-CM | POA: Diagnosis present

## 2018-09-28 DIAGNOSIS — E785 Hyperlipidemia, unspecified: Secondary | ICD-10-CM | POA: Diagnosis present

## 2018-09-28 DIAGNOSIS — R350 Frequency of micturition: Secondary | ICD-10-CM | POA: Diagnosis present

## 2018-09-28 DIAGNOSIS — I701 Atherosclerosis of renal artery: Secondary | ICD-10-CM | POA: Diagnosis present

## 2018-09-28 DIAGNOSIS — I70201 Unspecified atherosclerosis of native arteries of extremities, right leg: Secondary | ICD-10-CM | POA: Diagnosis present

## 2018-09-28 DIAGNOSIS — Z888 Allergy status to other drugs, medicaments and biological substances status: Secondary | ICD-10-CM | POA: Diagnosis not present

## 2018-09-28 DIAGNOSIS — E1151 Type 2 diabetes mellitus with diabetic peripheral angiopathy without gangrene: Secondary | ICD-10-CM | POA: Diagnosis present

## 2018-09-28 DIAGNOSIS — I129 Hypertensive chronic kidney disease with stage 1 through stage 4 chronic kidney disease, or unspecified chronic kidney disease: Secondary | ICD-10-CM | POA: Diagnosis present

## 2018-09-28 DIAGNOSIS — Z91040 Latex allergy status: Secondary | ICD-10-CM | POA: Diagnosis not present

## 2018-09-28 DIAGNOSIS — Z20828 Contact with and (suspected) exposure to other viral communicable diseases: Secondary | ICD-10-CM | POA: Diagnosis present

## 2018-09-28 DIAGNOSIS — E1159 Type 2 diabetes mellitus with other circulatory complications: Secondary | ICD-10-CM | POA: Diagnosis present

## 2018-09-28 DIAGNOSIS — J449 Chronic obstructive pulmonary disease, unspecified: Secondary | ICD-10-CM

## 2018-09-28 DIAGNOSIS — K860 Alcohol-induced chronic pancreatitis: Secondary | ICD-10-CM | POA: Diagnosis present

## 2018-09-28 DIAGNOSIS — K8681 Exocrine pancreatic insufficiency: Secondary | ICD-10-CM | POA: Diagnosis present

## 2018-09-28 DIAGNOSIS — R809 Proteinuria, unspecified: Secondary | ICD-10-CM | POA: Diagnosis present

## 2018-09-28 DIAGNOSIS — R824 Acetonuria: Secondary | ICD-10-CM | POA: Diagnosis present

## 2018-09-28 DIAGNOSIS — K219 Gastro-esophageal reflux disease without esophagitis: Secondary | ICD-10-CM | POA: Diagnosis present

## 2018-09-28 DIAGNOSIS — F1011 Alcohol abuse, in remission: Secondary | ICD-10-CM | POA: Diagnosis present

## 2018-09-28 DIAGNOSIS — I2510 Atherosclerotic heart disease of native coronary artery without angina pectoris: Secondary | ICD-10-CM

## 2018-09-28 DIAGNOSIS — K852 Alcohol induced acute pancreatitis without necrosis or infection: Secondary | ICD-10-CM | POA: Diagnosis present

## 2018-09-28 DIAGNOSIS — J4489 Other specified chronic obstructive pulmonary disease: Secondary | ICD-10-CM | POA: Diagnosis present

## 2018-09-28 DIAGNOSIS — R0789 Other chest pain: Secondary | ICD-10-CM | POA: Diagnosis present

## 2018-09-28 DIAGNOSIS — Z887 Allergy status to serum and vaccine status: Secondary | ICD-10-CM | POA: Diagnosis not present

## 2018-09-28 LAB — COMPREHENSIVE METABOLIC PANEL
ALT: 17 U/L (ref 0–44)
AST: 18 U/L (ref 15–41)
Albumin: 3.9 g/dL (ref 3.5–5.0)
Alkaline Phosphatase: 62 U/L (ref 38–126)
Anion gap: 11 (ref 5–15)
BUN: 13 mg/dL (ref 8–23)
CO2: 20 mmol/L — ABNORMAL LOW (ref 22–32)
Calcium: 8.8 mg/dL — ABNORMAL LOW (ref 8.9–10.3)
Chloride: 106 mmol/L (ref 98–111)
Creatinine, Ser: 0.98 mg/dL (ref 0.44–1.00)
GFR calc Af Amer: >60 mL/min (ref >60)
GFR calc non Af Amer: >60 mL/min (ref >60)
Glucose, Bld: 95 mg/dL (ref 70–99)
Potassium: 3.6 mmol/L (ref 3.5–5.1)
Sodium: 137 mmol/L (ref 135–145)
Total Bilirubin: 0.5 mg/dL (ref 0.3–1.2)
Total Protein: 6.3 g/dL — ABNORMAL LOW (ref 6.5–8.1)

## 2018-09-28 LAB — HEMOGLOBIN A1C
Hgb A1c MFr Bld: 7.1 % — ABNORMAL HIGH (ref 4.8–5.6)
Mean Plasma Glucose: 157.07 mg/dL

## 2018-09-28 LAB — GLUCOSE, CAPILLARY
Glucose-Capillary: 117 mg/dL — ABNORMAL HIGH (ref 70–99)
Glucose-Capillary: 112 mg/dL — ABNORMAL HIGH (ref 70–99)
Glucose-Capillary: 134 mg/dL — ABNORMAL HIGH (ref 70–99)
Glucose-Capillary: 214 mg/dL — ABNORMAL HIGH (ref 70–99)

## 2018-09-28 LAB — TRIGLYCERIDES: Triglycerides: 130 mg/dL (ref <150)

## 2018-09-28 LAB — URINALYSIS, ROUTINE W REFLEX MICROSCOPIC
Bacteria, UA: NONE SEEN
Bilirubin Urine: NEGATIVE
Glucose, UA: 50 mg/dL — AB
Ketones, ur: 20 mg/dL — AB
Nitrite: NEGATIVE
Protein, ur: 100 mg/dL — AB
Specific Gravity, Urine: 1.02 (ref 1.005–1.030)
pH: 5 (ref 5.0–8.0)

## 2018-09-28 LAB — CBC
HCT: 40.4 % (ref 36.0–46.0)
Hemoglobin: 12.7 g/dL (ref 12.0–15.0)
MCH: 28.9 pg (ref 26.0–34.0)
MCHC: 31.4 g/dL (ref 30.0–36.0)
MCV: 92 fL (ref 80.0–100.0)
Platelets: 246 10*3/uL (ref 150–400)
RBC: 4.39 MIL/uL (ref 3.87–5.11)
RDW: 13.8 % (ref 11.5–15.5)
WBC: 5.7 10*3/uL (ref 4.0–10.5)
nRBC: 0 % (ref 0.0–0.2)

## 2018-09-28 LAB — CBG MONITORING, ED: Glucose-Capillary: 98 mg/dL (ref 70–99)

## 2018-09-28 LAB — SARS CORONAVIRUS 2 BY RT PCR (HOSPITAL ORDER, PERFORMED IN ~~LOC~~ HOSPITAL LAB): SARS Coronavirus 2: NEGATIVE

## 2018-09-28 LAB — LIPASE, BLOOD: Lipase: 674 U/L — ABNORMAL HIGH (ref 11–51)

## 2018-09-28 LAB — HIV ANTIBODY (ROUTINE TESTING W REFLEX): HIV Screen 4th Generation wRfx: NONREACTIVE

## 2018-09-28 MED ORDER — SODIUM CHLORIDE 0.9 % IV SOLN
INTRAVENOUS | Status: DC
  Administered 2018-09-28 (×2): via INTRAVENOUS

## 2018-09-28 MED ORDER — ACETAMINOPHEN 325 MG PO TABS
650.0000 mg | ORAL_TABLET | Freq: Four times a day (QID) | ORAL | Status: DC | PRN
  Administered 2018-09-28 – 2018-09-30 (×5): 650 mg via ORAL
  Filled 2018-09-28 (×4): qty 2

## 2018-09-28 MED ORDER — INSULIN ASPART 100 UNIT/ML ~~LOC~~ SOLN
0.0000 [IU] | SUBCUTANEOUS | Status: DC
  Administered 2018-09-28: 1 [IU] via SUBCUTANEOUS

## 2018-09-28 MED ORDER — INSULIN ASPART 100 UNIT/ML ~~LOC~~ SOLN
0.0000 [IU] | Freq: Three times a day (TID) | SUBCUTANEOUS | Status: DC
  Administered 2018-09-29: 2 [IU] via SUBCUTANEOUS
  Administered 2018-09-29 – 2018-09-30 (×3): 1 [IU] via SUBCUTANEOUS

## 2018-09-28 MED ORDER — MOMETASONE FURO-FORMOTEROL FUM 200-5 MCG/ACT IN AERO
2.0000 | INHALATION_SPRAY | Freq: Two times a day (BID) | RESPIRATORY_TRACT | Status: DC
  Administered 2018-09-28 – 2018-09-30 (×4): 2 via RESPIRATORY_TRACT
  Filled 2018-09-28: qty 8.8

## 2018-09-28 MED ORDER — HYDROMORPHONE HCL 1 MG/ML IJ SOLN
0.5000 mg | Freq: Once | INTRAMUSCULAR | Status: AC
  Administered 2018-09-28: 0.5 mg via INTRAVENOUS
  Filled 2018-09-28: qty 1

## 2018-09-28 MED ORDER — ONDANSETRON HCL 4 MG/2ML IJ SOLN
4.0000 mg | Freq: Four times a day (QID) | INTRAMUSCULAR | Status: DC | PRN
  Administered 2018-09-28 – 2018-09-30 (×7): 4 mg via INTRAVENOUS
  Filled 2018-09-28 (×9): qty 2

## 2018-09-28 MED ORDER — ALBUTEROL SULFATE HFA 108 (90 BASE) MCG/ACT IN AERS
1.0000 | INHALATION_SPRAY | Freq: Two times a day (BID) | RESPIRATORY_TRACT | Status: DC | PRN
  Filled 2018-09-28: qty 6.7

## 2018-09-28 MED ORDER — ACETAMINOPHEN 650 MG RE SUPP: 650.0000 mg | Freq: Four times a day (QID) | RECTAL | Status: DC | PRN

## 2018-09-28 MED ORDER — HYDROMORPHONE HCL 1 MG/ML IJ SOLN
0.5000 mg | INTRAMUSCULAR | Status: DC | PRN
  Administered 2018-09-28 – 2018-09-29 (×5): 1 mg via INTRAVENOUS
  Filled 2018-09-28 (×5): qty 1

## 2018-09-28 MED ORDER — ENOXAPARIN SODIUM 40 MG/0.4ML ~~LOC~~ SOLN
40.0000 mg | Freq: Every day | SUBCUTANEOUS | Status: DC
  Administered 2018-09-28 – 2018-09-30 (×3): 40 mg via SUBCUTANEOUS
  Filled 2018-09-28 (×3): qty 0.4

## 2018-09-28 MED ORDER — SODIUM CHLORIDE 0.9 % IV SOLN
INTRAVENOUS | Status: AC
  Administered 2018-09-28: 03:00:00 via INTRAVENOUS

## 2018-09-28 MED ORDER — HYDROMORPHONE HCL 1 MG/ML IJ SOLN
0.5000 mg | INTRAMUSCULAR | Status: DC | PRN
  Administered 2018-09-28 (×2): 1 mg via INTRAVENOUS
  Filled 2018-09-28 (×2): qty 1

## 2018-09-28 MED ORDER — LABETALOL HCL 5 MG/ML IV SOLN: 10.0000 mg | INTRAVENOUS | Status: DC | PRN

## 2018-09-28 MED ORDER — INSULIN ASPART 100 UNIT/ML ~~LOC~~ SOLN
0.0000 [IU] | Freq: Every day | SUBCUTANEOUS | Status: DC
  Administered 2018-09-28: 2 [IU] via SUBCUTANEOUS

## 2018-09-28 MED ORDER — ALBUTEROL SULFATE (2.5 MG/3ML) 0.083% IN NEBU: 2.5000 mg | INHALATION_SOLUTION | Freq: Two times a day (BID) | RESPIRATORY_TRACT | Status: DC | PRN

## 2018-09-28 NOTE — Progress Notes (Signed)
Patient admitted after midnight, please see H&P.  Here with acute pancreatitis.  Clear diet.  Pain control and IVF.  Hope for improvement over next 24-48 hours. Eulogio Bear DO

## 2018-09-28 NOTE — H&P (Signed)
History and Physical    [name] NOB:[id number] DOB: 01-10-1956 DOA: 09/27/2018  PCP: Nickola Major, MD   Patient coming from: Home   Chief Complaint: Abdominal pain, N/V   HPI: [name] is a 63 y.o. 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  Patient reports that epigastric abdominal pain began approximately 3 days ago, similar to her prior experiences with acute pancreatitis, and now has progressively worsened, becoming severe, radiating through to her back, and associated with nausea and 5-10 episodes of nonbloody vomiting over the past day.  She follows with gastroenterology with [facility], her pancreatitis has been attributed to alcohol, but the patient reports that she has not had any alcohol in several years.  She denies fevers or chills.  She denies cough or shortness of breath.   ED Course: Upon arrival to the ED, patient is found to be afebrile, saturating well on room air, and with remaining vitals normal.  Chemistry panel is notable for a creatinine 1.10, similar to priors.  CBC is unremarkable.  Lipase is elevated to 2354.  COVID-19 screening test is negative.  Urinalysis notable for ketonuria, proteinuria, and glucosuria.  CT the abdomen and pelvis findings are consistent with acute on chronic pancreatitis without abscess or pseudocyst.  Patient was given 2 L normal saline, Zofran, morphine, and Dilaudid in the emergency department and hospitalists are consulted for admission.  Review of Systems:  All other systems reviewed and apart from HPI, are negative.  Past Medical History:  Diagnosis Date   Asthma with COPD ([place])    PATIENT  DENIES   CAD (coronary artery disease)    a. [place] 2011: Diffuse distal and branch vessel disease - patient managed medically, no interventional options. b. [place] 03/2014 - low risk, no ischemia.   Complication of anesthesia    slow to wake up with last surgery in 2011    Diabetes mellitus with circulatory complication ([place])    Dyslipidemia    Headache    hx of migraines    Hypertension    Osteoarthritis    severe right knee  R TKR   PAD (peripheral artery disease) ([place])    a. Severe stenosis mid right SFA s/p atherectomy 03/25/09. b. peripheral angiography in 12/2011 which showed only about 50% diffuse right SFA stenosis.   Pancreatitis 2003   Renal artery stenosis ([place])    a. [place] 2011 - 40-50% left RAS.   Tobacco use disorder    quit 11/10    Past Surgical History:  Procedure Laterality Date   ABDOMINAL AORTAGRAM N/A 12/21/2011   Procedure: ABDOMINAL AORTAGRAM;  Surgeon: Wellington Hampshire, MD;  Location: [facility];  Service: Cardiovascular;  Laterality: N/A;   CARDIAC CATHETERIZATION     ESOPHAGOGASTRODUODENOSCOPY (EGD) WITH PROPOFOL N/A 10/05/2017   Procedure: ESOPHAGOGASTRODUODENOSCOPY (EGD) WITH PROPOFOL;  Surgeon: Milus Banister, MD;  Location: [facility] ENDOS[facility];  Service: Endoscopy;  Laterality: N/A;   EUS N/A 10/05/2017   Procedure: UPPER ENDOSCOPIC ULTRASOUND (EUS) RADIAL;  Surgeon: Milus Banister, MD;  Location: [facility];  Service: Endoscopy;  Laterality: N/A;   FOOT SURGERY Left    LOWER EXTREMITY ANGIOGRAM Bilateral 05/27/2015   Procedure: Lower Extremity Angiogram;  Surgeon: Wellington Hampshire, MD;  Location:  [facility];  Service: Cardiovascular;  Laterality: Bilateral;   PERIPHERAL VASCULAR CATHETERIZATION N/A 05/27/2015   Procedure: Abdominal Aortogram;  Surgeon: Wellington Hampshire, MD;  Location: [facility];  Service: Cardiovascular;  Laterality: N/A;   PERIPHERAL VASCULAR CATHETERIZATION Right 05/27/2015   Procedure: Peripheral Vascular  Intervention;  Surgeon: Wellington Hampshire, MD;  Location: [facility];  Service: Cardiovascular;  Laterality: Right;  SFA   TOTAL KNEE ARTHROPLASTY Right 2011   TOTAL KNEE ARTHROPLASTY Left 05/22/2014   Procedure: LEFT TOTAL KNEE ARTHROPLASTY;  Surgeon: Latanya Maudlin, MD;  Location: [facility];  Service: Orthopedics;  Laterality: Left;   TUBAL LIGATION  1980     reports that she quit smoking about 9 years ago. She has never used smokeless tobacco. She reports that she does not drink alcohol or use drugs.  Allergies  Allergen Reactions   Gabapentin Other (See Comments)    Brain shakes   Glucotrol [Glipizide] Rash and Other (See Comments)    Red rash   Influenza Vaccines Other (See Comments)    Significant arm soreness requiring 1 year of physical therapy   Latex Rash   Milk-Related Compounds Rash and Other (See Comments)    Fever   Nortriptyline Other (See Comments)    BRAIN SHAKE   Tetanus Toxoid Hives, Swelling, Rash and Other (See Comments)    Swelling to site of injection with rash and fever   Invokana [Canagliflozin] Other (See Comments)    Yeast Infections    Family History  Problem Relation Age of Onset   CVA Mother    Emphysema Father    Breast cancer Sister 34     Prior to Admission medications   Medication Sig Start Date End Date Taking? Authorizing Provider  acetaminophen (TYLENOL) 500 MG tablet Take 1,000 mg by mouth every 6 (six) hours as needed for moderate pain or headache.   Yes [provider]  ADVAIR DISKUS 250-50 MCG/DOSE AEPB INHALE 1 PUFF BY MOUTH INTO THE LUNGS EVERY 12 HOURS Patient taking differently: Inhale 1 puff into the lungs 2 (two) times daily.  01/01/18  Yes Susy Frizzle, MD  aspirin 81 MG tablet Take 81 mg by mouth daily.     Yes [provider]  Biotin w/ Vitamins C & E (HAIR/SKIN/NAILS PO) Take 1 tablet by mouth daily.   Yes [provider]  bisoprolol (ZEBETA) 5 MG tablet TAKE ONE-HALF TABLET  BY MOUTH EVERY DAY Patient taking differently: Take 2.5 mg by mouth at bedtime.  09/18/18  Yes Susy Frizzle, MD  Blood Glucose Monitoring Suppl (BAYER CONTOUR MONITOR) w/Device KIT CHECK BLOOD SUGAR EVERY DAY 08/20/15  Yes Susy Frizzle, MD  clopidogrel (PLAVIX) 75 MG tablet Take 1 tablet (75 mg total) by mouth daily. 04/06/17  Yes Wellington Hampshire, MD  CONTOUR NEXT TEST test strip CHECK BLOOD SUGAR DAILY AS DIRECTED 09/14/17  Yes Pickard, Cammie Mcgee, MD  CREON 36000 units CPEP capsule TAKE 2 CAPSULES BY MOUTH THREE TIMES DAILY BEFORE MEALS Patient taking differently: Take 72,000 Units by mouth 3 (three) times daily as needed (when eating a meal).  03/12/18  Yes Susy Frizzle, MD  diclofenac sodium (VOLTAREN) 1 % GEL Apply 2 g topically 4 (four) times daily. Patient taking differently: Apply 2 g topically 4 (four) times daily as needed (for hand pain).  05/04/16  Yes Susy Frizzle, MD  HYDROcodone-acetaminophen (NORCO/VICODIN) 5-325 MG tablet Take 1 tablet by mouth 4 (four) times  daily. 02/22/18  Yes Susy Frizzle, MD  isosorbide mononitrate (IMDUR) 60 MG 24 hr tablet TAKE 1 TABLET BY MOUTH DAILY Patient taking differently: Take 60 mg by mouth at bedtime.  02/19/18  Yes Susy Frizzle, MD  metFORMIN (GLUCOPHAGE) 1000 MG tablet TAKE 1 TABLET BY MOUTH TWICE DAILY WITH MEALS 03/26/18  Yes Susy Frizzle, MD  methocarbamol (ROBAXIN) 500 MG tablet TAKE 1 TABLET BY MOUTH EVERY 6 HOURS AS NEEDED FOR MUSCLE SPASMS Patient taking differently: Take 500 mg by mouth every 6 (six) hours as needed for muscle spasms.  05/07/18  Yes Susy Frizzle, MD  Multiple Vitamin (MULTIVITAMIN) tablet Take 1 tablet by mouth daily.     Yes [provider]  nitroGLYCERIN (NITROSTAT) 0.4 MG SL tablet Place 1 tablet under the tongue every 5 (five) minutes as needed for chest pain. 09/26/18  Yes [provider]  ondansetron (ZOFRAN) 4 MG tablet TAKE 1 TABLET BY MOUTH EVERY 8 HOURS AS NEEDED FOR  NAUSEA AND VOMITING Patient taking differently: Take 4 mg by mouth every 8 (eight) hours as needed for nausea or vomiting.  06/11/18  Yes Susy Frizzle, MD  pantoprazole (PROTONIX) 40 MG tablet TAKE 1 TABLET BY MOUTH DAILY Patient taking differently: 40 mg.  03/21/18  Yes Susy Frizzle, MD  pioglitazone (ACTOS) 30 MG tablet Take 1 tablet (30 mg total) by mouth daily. 08/25/17  Yes Susy Frizzle, MD  Probiotic Product (TRUBIOTICS PO) Take 1 capsule by mouth daily.   Yes [provider]  rosuvastatin (CRESTOR) 40 MG tablet Take 1 tablet (40 mg total) by mouth at bedtime. 10/17/17  Yes Pickard, Cammie Mcgee, MD  VENTOLIN HFA 108 (90 Base) MCG/ACT inhaler INHALE 2 PUFFS BY MOUTH TWICE DAILY AS NEEDED FOR WHEEZING OR SHORTNESS OF BREATH Patient taking differently: Inhale 1 puff into the lungs 2 (two) times daily as needed for wheezing or shortness of breath.  08/31/17  Yes Susy Frizzle, MD    Physical Exam: Vitals:   09/27/18 1930 09/27/18 2156  BP: (!) 158/87 (!) 147/84  Pulse: 82 88  Resp: 14 18  Temp: 97.6 F (36.4 C) 98.7 F (37.1 C)  TempSrc: Oral Oral  SpO2: 100% 99%    Constitutional: NAD, appears uncomfortable  Eyes: PERTLA, lids and conjunctivae normal ENMT: Mucous membranes are moist. Posterior pharynx clear of any exudate or lesions.   Neck: normal, supple, no masses, no thyromegaly Respiratory: no wheezing, no crackles. Normal respiratory effort. No accessory muscle use.  Cardiovascular: S1 & S2 heard, regular rate and rhythm. No extremity edema.   Abdomen: No distension, soft, tender in epigastrium. Bowel sounds active.  Musculoskeletal: no clubbing / cyanosis. No joint deformity upper and lower extremities.   Skin: no significant rashes, lesions, ulcers. Warm, dry, well-perfused. Neurologic: CN 2-12 grossly intact. Sensation intact. Strength 5/5 in all 4 limbs.  Psychiatric: Alert and oriented x 3. Calm, cooperative.    Labs on Admission: I have personally  reviewed following labs and imaging studies  CBC: Recent Labs  Lab 09/27/18 1709  WBC 7.8  HGB 14.2  HCT 45.1  MCV 92.8  PLT 435   Basic Metabolic Panel: Recent Labs  Lab 09/27/18 1709  NA 138  K 3.9  CL 103  CO2 21*  GLUCOSE 164*  BUN 14  CREATININE 1.10*  CALCIUM 10.0   GFR: CrCl cannot be calculated (Unknown ideal weight.). Liver Function Tests: Recent Labs  Lab 09/27/18 1709  AST 25  ALT 22  ALKPHOS 64  BILITOT 0.7  PROT 7.0  ALBUMIN 4.5   Recent Labs  Lab 09/27/18 1709  LIPASE 2,354*   No results for input(s): AMMONIA in the last 168 hours. Coagulation Profile: No results for input(s): INR, PROTIME in the last 168 hours. Cardiac Enzymes: No results for input(s): CKTOTAL, CKMB, CKMBINDEX, TROPONINI in the last 168 hours. BNP (last 3 results) No results for input(s): PROBNP in the last 8760 hours. HbA1C: No results for input(s): HGBA1C in the last 72 hours. CBG: No results for input(s): GLUCAP in the last 168 hours. Lipid Profile: No results for input(s): CHOL, HDL, LDLCALC, TRIG, CHOLHDL, LDLDIRECT in the last 72 hours. Thyroid Function Tests: No results for input(s): TSH, T4TOTAL, FREET4, T3FREE, THYROIDAB in the last 72 hours. Anemia Panel: No results for input(s): VITAMINB12, FOLATE, FERRITIN, TIBC, IRON, RETICCTPCT in the last 72 hours. Urine analysis:    Component Value Date/Time   COLORURINE YELLOW 09/27/2018 2349   APPEARANCEUR CLEAR 09/27/2018 2349   LABSPEC 1.020 09/27/2018 2349   PHURINE 5.0 09/27/2018 2349   GLUCOSEU 50 (A) 09/27/2018 2349   HGBUR SMALL (A) 09/27/2018 2349   BILIRUBINUR NEGATIVE 09/27/2018 2349   KETONESUR 20 (A) 09/27/2018 2349   PROTEINUR 100 (A) 09/27/2018 2349   UROBILINOGEN 0.2 05/13/2014 1250   NITRITE NEGATIVE 09/27/2018 2349   LEUKOCYTESUR TRACE (A) 09/27/2018 2349   Sepsis Labs: @LABRCNTIP (procalcitonin:4,lacticidven:4) ) Recent Results (from the past 240 hour(s))  SARS Coronavirus 2 [facility]  order, Performed in [facility] hospital lab) Nasopharyngeal Nasopharyngeal Swab     Status: None   Collection Time: 09/27/18 10:28 PM   Specimen: Nasopharyngeal Swab  Result Value Ref Range Status   SARS Coronavirus 2 NEGATIVE NEGATIVE Final    Comment: (NOTE) If result is NEGATIVE SARS-CoV-2 target nucleic acids are NOT DETECTED. The SARS-CoV-2 RNA is generally detectable in upper and lower  respiratory specimens during the acute phase of infection. The lowest  concentration of SARS-CoV-2 viral copies this assay can detect is 250  copies / mL. A negative result does not preclude SARS-CoV-2 infection  and should not be used as the sole basis for treatment or other  patient management decisions.  A negative result may occur with  improper specimen collection / handling, submission of specimen other  than nasopharyngeal swab, presence of viral mutation(s) within the  areas targeted by this assay, and inadequate number of viral copies  (<250 copies / mL). A negative result must be combined with clinical  observations, patient history, and epidemiological information. If result is POSITIVE SARS-CoV-2 target nucleic acids are DETECTED. The SARS-CoV-2 RNA is generally detectable in upper and lower  respiratory specimens dur ing the acute phase of infection.  Positive  results are indicative of active infection with SARS-CoV-2.  Clinical  correlation with patient history and other diagnostic information is  necessary to determine patient infection status.  Positive results do  not rule out bacterial infection or co-infection with other viruses. If result is PRESUMPTIVE POSTIVE SARS-CoV-2 nucleic acids MAY BE PRESENT.   A presumptive positive result was obtained on the submitted specimen  and confirmed on repeat testing.  While 2019 novel coronavirus  (SARS-CoV-2) nucleic acids may be present in the submitted sample  additional confirmatory testing may be necessary for epidemiological  and  / or clinical management purposes  to differentiate between  SARS-CoV-2 and other Sarbecovirus currently known to infect humans.  If clinically indicated additional testing with an alternate test  methodology [phone])  is advised. The SARS-CoV-2 RNA is generally  detectable in upper and lower respiratory sp ecimens during the acute  phase of infection. The expected result is Negative. Fact Sheet for Patients:  StrictlyIdeas.no Fact Sheet for Healthcare Providers: BankingDealers.co.za This test is not yet approved or cleared by the [place] FDA and has been authorized for detection and/or diagnosis of SARS-CoV-2 by FDA under an Emergency Use Authorization (EUA).  This EUA will remain in effect (meaning this test can be used) for the duration of the COVID-19 declaration under Section 564(b)(1) of the Act, 21 U.S.C. section 360bbb-3(b)(1), unless the authorization is terminated or revoked sooner. Performed at [facility], [place] [address], [place], [place] [address]      Radiological Exams on Admission: Ct Abdomen Pelvis W Contrast  Result Date: 09/28/2018 CLINICAL DATA:  63 year old female with history of pancreatitis presenting with abdominal pain and elevated lipase. EXAM: CT ABDOMEN AND PELVIS WITH CONTRAST TECHNIQUE: Multidetector CT imaging of the abdomen and pelvis was performed using the standard protocol following bolus administration of intravenous contrast. CONTRAST:  197m OMNIPAQUE IOHEXOL 300 MG/ML  SOLN COMPARISON:  CT of the abdomen pelvis dated 12/08/2017 FINDINGS: Lower chest: The visualized lung bases are clear. There is multi vessel coronary vascular calcification. No intra-abdominal free air or free fluid. Hepatobiliary: Probable mild fatty infiltration the liver. No intrahepatic biliary ductal dilatation. No calcified gallstone or pericholecystic fluid. Pancreas: Scattered coarse pancreatic calcification primarily  involving the head and uncinate process of the pancreas sequela of chronic pancreatitis. There is diffuse edema and peripancreatic stranding consistent with acute on chronic pancreatitis. There is no drainable fluid collection/abscess or pseudocyst. Spleen: Normal in size without focal abnormality. Adrenals/Urinary Tract: The adrenal glands are unremarkable. Punctate nonobstructing left renal inferior pole calculus. There is no hydronephrosis on either side. There is symmetric enhancement and excretion of contrast by both kidneys. Several bilateral hypodense lesions measure up to 12 mm, suboptimally characterized. The larger lesions appears similar to prior CT and may represent cysts. The visualized ureters are unremarkable. The urinary bladder is collapsed. Stomach/Bowel: There is moderate stool throughout the colon. There is sigmoid diverticulosis without active inflammatory changes. There is no bowel obstruction. The appendix is not visualized with certainty. No inflammatory changes identified in the right lower quadrant. Vascular/Lymphatic: Advanced aortoiliac atherosclerotic disease. The IVC is unremarkable. No portal venous gas. There is no adenopathy. Reproductive: The uterus and ovaries are grossly unremarkable. No pelvic mass. Other: None Musculoskeletal: Degenerative changes of the spine. No acute osseous pathology. IMPRESSION: 1. Acute on chronic pancreatitis. No abscess or pseudocyst. 2. Colonic diverticulosis. No bowel obstruction. 3. Aortic Atherosclerosis (ICD10-I70.0). Electronically Signed   By: AAnner CreteM.D.   On: 09/28/2018 00:19    EKG: Not performed.   Assessment/Plan   1. Acute on chronic pancreatitis  - Presents with 3 days of progressive epigastric pain with N/V similar to her prior episodes of acute pancreatitis, and she is found to have lipase 2354 and CT-findings consistent with acute on chronic pancreatitis without fluid-collection  - She follows with GI and her  pancreatitis has been attributed to EtOH  - She denies any alcohol use in years, LFT's are normal, no ductal dilatation or gallstones seen on CT, and triglycerides were normal in December 2019   - She was given 2 liters NS, Zofran, morphine, and Dilaudid in ED, but continues to complain of severe pain and nausea  - Continue IVF hydration, bowel-rest, pain-control    2. CAD  -  No anginal complaints currently but was having some atypical chest pain recently and is being scheduled for outpatient stress test  - Resume medications once she is able to tolerate a diet    3. Type II DM  - A1c was 6.7% in December 2019 - Managed with metformin at home, held on admission  - Check CBG's and use a low-intensity SSI with Novolog while in hospital    4. CKD II-III  - SCr is at baseline  - Renally-dose medications    5. COPD  - Stable  - Continue ICS/LABA and as-needed albuterol   6. Hypertension  - BP at goal, use parenteral agents as needed for now until she can tolerate a diet     PPE: Mask, face shield  DVT prophylaxis: Lovenox  Code Status: Full  Family Communication: Discussed with patient  Consults called: none  Admission status: Observation     Vianne Bulls, MD [facility] Pager [phone]  If 7PM-7AM, please contact night-coverage [email or web address] Password [email or web address]  09/28/2018, 1:29 AM

## 2018-09-28 NOTE — Plan of Care (Signed)
  Problem: Pain Managment: Goal: General experience of comfort will improve Outcome: Progressing   Problem: Safety: Goal: Ability to remain free from injury will improve Outcome: Progressing   Problem: Skin Integrity: Goal: Risk for impaired skin integrity will decrease Outcome: Progressing   

## 2018-09-28 NOTE — ED Notes (Addendum)
ED TO INPATIENT HANDOFF REPORT  ED Nurse Name and Phone #:  Molly Maduro RN (7p-7a)/ Pete Glatter RN [phone]) : [phone] S Name/Age/Gender [name] 63 y.o. female Room/Bed: 026C/026C  Code Status   Code Status: Full Code  Home/SNF/Other Home {Patient oriented x4 Is this baseline? yes  Triage Complete: Triage complete  Chief Complaint R sided stomach pain, vomiting  Triage Note Pt reports RUQ pain x 2-3 days. Pt reports N/V x 1 day. the patient reports hx of pancreatitis, feels like a flare-up.    Allergies Allergies  Allergen Reactions  . Gabapentin Other (See Comments)    Brain shakes  . Glucotrol [Glipizide] Rash and Other (See Comments)    Red rash  . Influenza Vaccines Other (See Comments)    Significant arm soreness requiring 1 year of physical therapy  . Latex Rash  . Milk-Related Compounds Rash and Other (See Comments)    Fever  . Nortriptyline Other (See Comments)    BRAIN SHAKE  . Tetanus Toxoid Hives, Swelling, Rash and Other (See Comments)    Swelling to site of injection with rash and fever  . Invokana [Canagliflozin] Other (See Comments)    Yeast Infections    Level of Care/Admitting Diagnosis ED Disposition    ED Disposition Condition Comment   Admit  Hospital Area: [facility] [[id number]]  Level of Care: Med-Surg [16]  I expect the patient will be discharged within 24 hours: No (not a candidate for 5C-Observation unit)  Covid Evaluation: Confirmed COVID Negative  Diagnosis: Acute on chronic pancreatitis [facility]) [[id number]]  Admitting Physician: Briscoe Deutscher [[id number]]  Attending Physician: Briscoe Deutscher [[id number]]  PT Class (Do Not Modify): Observation [104]  PT Acc Code (Do Not Modify): Observation [10022]       B Medical/Surgery History Past Medical History:  Diagnosis Date  . Asthma with COPD (HCC)    PATIENT DENIES  . CAD (coronary artery disease)    a. LHC 2011: Diffuse distal and branch vessel disease - patient managed  medically, no interventional options. b. Nuc 03/2014 - low risk, no ischemia.  . Complication of anesthesia    slow to wake up with last surgery in 2011   . Diabetes mellitus with circulatory complication (HCC)   . Dyslipidemia   . Headache    hx of migraines   . Hypertension   . Osteoarthritis    severe right knee  R TKR  . PAD (peripheral artery disease) (HCC)    a. Severe stenosis mid right SFA s/p atherectomy 03/25/09. b. peripheral angiography in 12/2011 which showed only about 50% diffuse right SFA stenosis.  . Pancreatitis 2003  . Renal artery stenosis (HCC)    a. LHC 2011 - 40-50% left RAS.  . Tobacco use disorder    quit 11/10   Past Surgical History:  Procedure Laterality Date  . ABDOMINAL AORTAGRAM N/A 12/21/2011   Procedure: ABDOMINAL Ronny Flurry;  Surgeon: Iran Ouch, MD;  Location: [facility] CATH LAB;  Service: Cardiovascular;  Laterality: N/A;  . CARDIAC CATHETERIZATION    . ESOPHAGOGASTRODUODENOSCOPY (EGD) WITH PROPOFOL N/A 10/05/2017   Procedure: ESOPHAGOGASTRODUODENOSCOPY (EGD) WITH PROPOFOL;  Surgeon: Rachael Fee, MD;  Location: [facility] ENDOSCOPY;  Service: Endoscopy;  Laterality: N/A;  . EUS N/A 10/05/2017   Procedure: UPPER ENDOSCOPIC ULTRASOUND (EUS) RADIAL;  Surgeon: Rachael Fee, MD;  Location: [facility] ENDOSCOPY;  Service: Endoscopy;  Laterality: N/A;  . FOOT SURGERY Left   . LOWER EXTREMITY ANGIOGRAM Bilateral 05/27/2015   Procedure: Lower Extremity  Angiogram;  Surgeon: Iran OuchMuhammad A Arida, MD;  Location: [facility] INVASIVE CV LAB;  Service: Cardiovascular;  Laterality: Bilateral;  . PERIPHERAL VASCULAR CATHETERIZATION N/A 05/27/2015   Procedure: Abdominal Aortogram;  Surgeon: Iran OuchMuhammad A Arida, MD;  Location: [facility] INVASIVE CV LAB;  Service: Cardiovascular;  Laterality: N/A;  . PERIPHERAL VASCULAR CATHETERIZATION Right 05/27/2015   Procedure: Peripheral Vascular Intervention;  Surgeon: Iran OuchMuhammad A Arida, MD;  Location: [facility] INVASIVE CV LAB;  Service: Cardiovascular;  Laterality: Right;   SFA  . TOTAL KNEE ARTHROPLASTY Right 2011  . TOTAL KNEE ARTHROPLASTY Left 05/22/2014   Procedure: LEFT TOTAL KNEE ARTHROPLASTY;  Surgeon: Ranee Gosselinonald Gioffre, MD;  Location: [facility] ORS;  Service: Orthopedics;  Laterality: Left;  . TUBAL LIGATION  1980     A IV Location/Drains/Wounds Patient Lines/Drains/Airways Status   Active Line/Drains/Airways    Name:   Placement date:   Placement time:   Site:   Days:   Peripheral IV 09/27/18 Left Antecubital   09/27/18    2313    Antecubital   1          Intake/Output Last 24 hours No intake or output data in the 24 hours ending 09/28/18 [id number]  Labs/Imaging Results for orders placed or performed during the hospital encounter of 09/27/18 (from the past 48 hour(s))  Lipase, blood     Status: Abnormal   Collection Time: 09/27/18  5:09 PM  Result Value Ref Range   Lipase 2,354 (H) 11 - 51 U/L    Comment: RESULTS CONFIRMED BY MANUAL DILUTION Performed at [facility], [address] [place], [place] [address]   Comprehensive metabolic panel     Status: Abnormal   Collection Time: 09/27/18  5:09 PM  Result Value Ref Range   Sodium 138 135 - 145 mmol/L   Potassium 3.9 3.5 - 5.1 mmol/L   Chloride 103 98 - 111 mmol/L   CO2 21 (L) 22 - 32 mmol/L   Glucose, Bld 164 (H) 70 - 99 mg/dL   BUN 14 8 - 23 mg/dL   Creatinine, Ser 4.091.10 (H) 0.44 - 1.00 mg/dL   Calcium 81.110.0 8.9 - 91.410.3 mg/dL   Total Protein 7.0 6.5 - 8.1 g/dL   Albumin 4.5 3.5 - 5.0 g/dL   AST 25 15 - 41 U/L   ALT 22 0 - 44 U/L   Alkaline Phosphatase 64 38 - 126 U/L   Total Bilirubin 0.7 0.3 - 1.2 mg/dL   GFR calc non Af Amer 54 (L) >60 mL/min   GFR calc Af Amer >60 >60 mL/min   Anion gap 14 5 - 15    Comment: Performed at [facility], [address] [place], [place] [address]  CBC     Status: None   Collection Time: 09/27/18  5:09 PM  Result Value Ref Range   WBC 7.8 4.0 - 10.5 K/uL   RBC 4.86 3.87 - 5.11 MIL/uL   Hemoglobin 14.2 12.0 - 15.0 g/dL   HCT 62.145.1 30.836.0 - 65.746.0 %    MCV 92.8 80.0 - 100.0 fL   MCH 29.2 26.0 - 34.0 pg   MCHC 31.5 30.0 - 36.0 g/dL   RDW 84.613.8 96.211.5 - 95.215.5 %   Platelets 273 150 - 400 K/uL   nRBC 0.0 0.0 - 0.2 %    Comment: Performed at [facility], [address] [place], [place] [address]  SARS Coronavirus 2 [facility](Hospital order, Performed in [facility] hospital lab) Nasopharyngeal Nasopharyngeal Swab  Status: None   Collection Time: 09/27/18 10:28 PM   Specimen: Nasopharyngeal Swab  Result Value Ref Range   SARS Coronavirus 2 NEGATIVE NEGATIVE    Comment: (NOTE) If result is NEGATIVE SARS-CoV-2 target nucleic acids are NOT DETECTED. The SARS-CoV-2 RNA is generally detectable in upper and lower  respiratory specimens during the acute phase of infection. The lowest  concentration of SARS-CoV-2 viral copies this assay can detect is 250  copies / mL. A negative result does not preclude SARS-CoV-2 infection  and should not be used as the sole basis for treatment or other  patient management decisions.  A negative result may occur with  improper specimen collection / handling, submission of specimen other  than nasopharyngeal swab, presence of viral mutation(s) within the  areas targeted by this assay, and inadequate number of viral copies  (<250 copies / mL). A negative result must be combined with clinical  observations, patient history, and epidemiological information. If result is POSITIVE SARS-CoV-2 target nucleic acids are DETECTED. The SARS-CoV-2 RNA is generally detectable in upper and lower  respiratory specimens dur ing the acute phase of infection.  Positive  results are indicative of active infection with SARS-CoV-2.  Clinical  correlation with patient history and other diagnostic information is  necessary to determine patient infection status.  Positive results do  not rule out bacterial infection or co-infection with other viruses. If result is PRESUMPTIVE POSTIVE SARS-CoV-2 nucleic acids MAY BE PRESENT.   A  presumptive positive result was obtained on the submitted specimen  and confirmed on repeat testing.  While 2019 novel coronavirus  (SARS-CoV-2) nucleic acids may be present in the submitted sample  additional confirmatory testing may be necessary for epidemiological  and / or clinical management purposes  to differentiate between  SARS-CoV-2 and other Sarbecovirus currently known to infect humans.  If clinically indicated additional testing with an alternate test  methodology [phone]) is advised. The SARS-CoV-2 RNA is generally  detectable in upper and lower respiratory sp ecimens during the acute  phase of infection. The expected result is Negative. Fact Sheet for Patients:  BoilerBrush.com.cy Fact Sheet for Healthcare Providers: https://pope.com/ This test is not yet approved or cleared by the [place] FDA and has been authorized for detection and/or diagnosis of SARS-CoV-2 by FDA under an Emergency Use Authorization (EUA).  This EUA will remain in effect (meaning this test can be used) for the duration of the COVID-19 declaration under Section 564(b)(1) of the Act, 21 U.S.C. section 360bbb-3(b)(1), unless the authorization is terminated or revoked sooner. Performed at [facility], [address], [place], [place] [address]   Urinalysis, Routine w reflex microscopic     Status: Abnormal   Collection Time: 09/27/18 11:49 PM  Result Value Ref Range   Color, Urine YELLOW YELLOW   APPearance CLEAR CLEAR   Specific Gravity, Urine 1.020 1.005 - 1.030   pH 5.0 5.0 - 8.0   Glucose, UA 50 (A) NEGATIVE mg/dL   Hgb urine dipstick SMALL (A) NEGATIVE   Bilirubin Urine NEGATIVE NEGATIVE   Ketones, ur 20 (A) NEGATIVE mg/dL   Protein, ur 811 (A) NEGATIVE mg/dL   Nitrite NEGATIVE NEGATIVE   Leukocytes,Ua TRACE (A) NEGATIVE   RBC / HPF 0-5 0 - 5 RBC/hpf   WBC, UA 6-10 0 - 5 WBC/hpf   Bacteria, UA NONE SEEN NONE SEEN   Squamous  Epithelial / LPF 0-5 0 - 5   Mucus PRESENT     Comment: Performed at  [facility], [address] [place], [place] [address]  Triglycerides     Status: None   Collection Time: 09/28/18  3:18 AM  Result Value Ref Range   Triglycerides 130 <150 mg/dL    Comment: Performed at [facility], [address] [place], [place] [address]  Hemoglobin A1c     Status: Abnormal   Collection Time: 09/28/18  3:18 AM  Result Value Ref Range   Hgb A1c MFr Bld 7.1 (H) 4.8 - 5.6 %    Comment: (NOTE) Pre diabetes:          5.7%-6.4% Diabetes:              >6.4% Glycemic control for   <7.0% adults with diabetes    Mean Plasma Glucose 157.07 mg/dL    Comment: Performed at [facility], [address] [place], [place] [address]  Comprehensive metabolic panel     Status: Abnormal   Collection Time: 09/28/18  3:18 AM  Result Value Ref Range   Sodium 137 135 - 145 mmol/L   Potassium 3.6 3.5 - 5.1 mmol/L   Chloride 106 98 - 111 mmol/L   CO2 20 (L) 22 - 32 mmol/L   Glucose, Bld 95 70 - 99 mg/dL   BUN 13 8 - 23 mg/dL   Creatinine, Ser 9.140.98 0.44 - 1.00 mg/dL   Calcium 8.8 (L) 8.9 - 10.3 mg/dL   Total Protein 6.3 (L) 6.5 - 8.1 g/dL   Albumin 3.9 3.5 - 5.0 g/dL   AST 18 15 - 41 U/L   ALT 17 0 - 44 U/L   Alkaline Phosphatase 62 38 - 126 U/L   Total Bilirubin 0.5 0.3 - 1.2 mg/dL   GFR calc non Af Amer >60 >60 mL/min   GFR calc Af Amer >60 >60 mL/min   Anion gap 11 5 - 15    Comment: Performed at [facility], [address] [place], [place] [address]  CBC     Status: None   Collection Time: 09/28/18  3:18 AM  Result Value Ref Range   WBC 5.7 4.0 - 10.5 K/uL   RBC 4.39 3.87 - 5.11 MIL/uL   Hemoglobin 12.7 12.0 - 15.0 g/dL   HCT 62.140.4 30.836.0 - 65.746.0 %   MCV 92.0 80.0 - 100.0 fL   MCH 28.9 26.0 - 34.0 pg   MCHC 31.4 30.0 - 36.0 g/dL   RDW 84.613.8 96.211.5 - 95.215.5 %   Platelets 246 150 - 400 K/uL   nRBC 0.0 0.0 - 0.2 %    Comment: Performed at [facility], [address]  [place], [place] [address]  Lipase, blood     Status: Abnormal   Collection Time: 09/28/18  3:18 AM  Result Value Ref Range   Lipase 674 (H) 11 - 51 U/L    Comment: RESULTS CONFIRMED BY MANUAL DILUTION Performed at [facility], [address] [place], [place] [address]   CBG monitoring, ED     Status: None   Collection Time: 09/28/18  3:38 AM  Result Value Ref Range   Glucose-Capillary 98 70 - 99 mg/dL   Ct Abdomen Pelvis W Contrast  Result Date: 09/28/2018 CLINICAL DATA:  [age] year old female with history of pancreatitis presenting with abdominal pain and elevated lipase. EXAM: CT ABDOMEN AND PELVIS WITH CONTRAST TECHNIQUE: Multidetector CT imaging of the abdomen and pelvis was performed using the standard protocol following bolus administration of intravenous  contrast. CONTRAST:  100mL OMNIPAQUE IOHEXOL 300 MG/ML  SOLN COMPARISON:  CT of the abdomen pelvis dated 12/08/2017 FINDINGS: Lower chest: The visualized lung bases are clear. There is multi vessel coronary vascular calcification. No intra-abdominal free air or free fluid. Hepatobiliary: Probable mild fatty infiltration the liver. No intrahepatic biliary ductal dilatation. No calcified gallstone or pericholecystic fluid. Pancreas: Scattered coarse pancreatic calcification primarily involving the head and uncinate process of the pancreas sequela of chronic pancreatitis. There is diffuse edema and peripancreatic stranding consistent with acute on chronic pancreatitis. There is no drainable fluid collection/abscess or pseudocyst. Spleen: Normal in size without focal abnormality. Adrenals/Urinary Tract: The adrenal glands are unremarkable. Punctate nonobstructing left renal inferior pole calculus. There is no hydronephrosis on either side. There is symmetric enhancement and excretion of contrast by both kidneys. Several bilateral hypodense lesions measure up to 12 mm, suboptimally characterized. The larger lesions appears similar to prior CT and  may represent cysts. The visualized ureters are unremarkable. The urinary bladder is collapsed. Stomach/Bowel: There is moderate stool throughout the colon. There is sigmoid diverticulosis without active inflammatory changes. There is no bowel obstruction. The appendix is not visualized with certainty. No inflammatory changes identified in the right lower quadrant. Vascular/Lymphatic: Advanced aortoiliac atherosclerotic disease. The IVC is unremarkable. No portal venous gas. There is no adenopathy. Reproductive: The uterus and ovaries are grossly unremarkable. No pelvic mass. Other: None Musculoskeletal: Degenerative changes of the spine. No acute osseous pathology. IMPRESSION: 1. Acute on chronic pancreatitis. No abscess or pseudocyst. 2. Colonic diverticulosis. No bowel obstruction. 3. Aortic Atherosclerosis (ICD10-I70.0). Electronically Signed   By: Elgie CollardArash  Radparvar M.D.   On: 09/28/2018 00:19    Pending Labs Unresulted Labs (From admission, onward)    Start     Ordered   10/05/18 0500  Creatinine, serum  (enoxaparin (LOVENOX)    CrCl >/= 30 ml/min)  Weekly,   R    Comments: while on enoxaparin therapy    09/28/18 0255   09/28/18 0500  HIV antibody (Routine Testing)  Tomorrow morning,   R     09/28/18 0255   09/28/18 0500  Lipase, blood  Daily,   R     09/28/18 0255          Vitals/Pain Today's Vitals   09/28/18 0430 09/28/18 0506 09/28/18 0600 09/28/18 0608  BP: 137/72  (!) 155/93   Pulse: 82  85   Resp: 18  18   Temp:      TempSrc:      SpO2: 97%  95%   PainSc: 0-No pain 0-No pain Asleep Asleep    Isolation Precautions No active isolations  Medications Medications  mometasone-formoterol (DULERA) 200-5 MCG/ACT inhaler 2 puff (has no administration in time range)  albuterol (VENTOLIN HFA) 108 (90 Base) MCG/ACT inhaler 1 puff (has no administration in time range)  insulin aspart (novoLOG) injection 0-9 Units (0 Units Subcutaneous Not Given 09/28/18 0343)  enoxaparin (LOVENOX)  injection 40 mg (has no administration in time range)  acetaminophen (TYLENOL) tablet 650 mg (has no administration in time range)    Or  acetaminophen (TYLENOL) suppository 650 mg (has no administration in time range)  HYDROmorphone (DILAUDID) injection 0.5-1 mg (1 mg Intravenous Given 09/28/18 0324)  ondansetron (ZOFRAN) injection 4 mg (has no administration in time range)  0.9 %  sodium chloride infusion ( Intravenous New Bag/Given 09/28/18 0322)  labetalol (NORMODYNE) injection 10 mg (has no administration in time range)  sodium chloride flush (NS) 0.9 % injection 3 mL (  3 mLs Intravenous Given 09/27/18 2315)  sodium chloride 0.9 % bolus 2,000 mL (0 mLs Intravenous Stopped 09/28/18 0315)  ondansetron (ZOFRAN) injection 4 mg (4 mg Intravenous Given 09/27/18 2310)  morphine 4 MG/ML injection 4 mg (4 mg Intravenous Given 09/27/18 2342)  iohexol (OMNIPAQUE) 300 MG/ML solution 100 mL (100 mLs Intravenous Contrast Given 09/27/18 2354)  HYDROmorphone (DILAUDID) injection 0.5 mg (0.5 mg Intravenous Given 09/28/18 0221)    Mobility walks Low fall risk   Focused Assessments History of pancreatitis / pain improved after pain medications .    R Recommendations: See Admitting Provider Note  Report given to:   Additional Notes:

## 2018-09-28 NOTE — ED Provider Notes (Signed)
Care assumed from [facility], PA-C.  Please see her full H&P.  In short,  [name] is a 63 y.o. female presents for epigastric abd pain x3 days.  Pt with hx of pancreatitis.  7 episodes of NBNB emesis at home.  Persistent pain here in the ED.  Elevated Lipase.    Physical Exam  BP (!) 147/84 (BP Location: Left Arm)   Pulse 88   Temp 98.7 F (37.1 C) (Oral)   Resp 18   SpO2 99%   Physical Exam Vitals signs and nursing note reviewed.  Constitutional:      General: She is not in acute distress.    Appearance: She is well-developed.  HENT:     Head: Normocephalic.  Eyes:     General: No scleral icterus.    Conjunctiva/sclera: Conjunctivae normal.  Neck:     Musculoskeletal: Normal range of motion.  Cardiovascular:     Rate and Rhythm: Normal rate.  Pulmonary:     Effort: Pulmonary effort is normal.  Musculoskeletal: Normal range of motion.  Skin:    General: Skin is warm and dry.  Neurological:     Mental Status: She is alert.     ED Course/Procedures   Clinical Course as of Sep 27 57  Fri Sep 28, 2018  0015 Plan:  Pt with pancreatitis, hx of same.  Pending CT, but will need admission for pancreatitis and intractable vomiting.   [HM]    Clinical Course User Index [HM] Samella Lucchetti, Gwenlyn Perking    Procedures    Ct Abdomen Pelvis W Contrast  Result Date: 09/28/2018 CLINICAL DATA:  63 year old female with history of pancreatitis presenting with abdominal pain and elevated lipase. EXAM: CT ABDOMEN AND PELVIS WITH CONTRAST TECHNIQUE: Multidetector CT imaging of the abdomen and pelvis was performed using the standard protocol following bolus administration of intravenous contrast. CONTRAST:  188mL OMNIPAQUE IOHEXOL 300 MG/ML  SOLN COMPARISON:  CT of the abdomen pelvis dated 12/08/2017 FINDINGS: Lower chest: The visualized lung bases are clear. There is multi vessel coronary vascular calcification. No intra-abdominal free air or free fluid. Hepatobiliary: Probable  mild fatty infiltration the liver. No intrahepatic biliary ductal dilatation. No calcified gallstone or pericholecystic fluid. Pancreas: Scattered coarse pancreatic calcification primarily involving the head and uncinate process of the pancreas sequela of chronic pancreatitis. There is diffuse edema and peripancreatic stranding consistent with acute on chronic pancreatitis. There is no drainable fluid collection/abscess or pseudocyst. Spleen: Normal in size without focal abnormality. Adrenals/Urinary Tract: The adrenal glands are unremarkable. Punctate nonobstructing left renal inferior pole calculus. There is no hydronephrosis on either side. There is symmetric enhancement and excretion of contrast by both kidneys. Several bilateral hypodense lesions measure up to 12 mm, suboptimally characterized. The larger lesions appears similar to prior CT and may represent cysts. The visualized ureters are unremarkable. The urinary bladder is collapsed. Stomach/Bowel: There is moderate stool throughout the colon. There is sigmoid diverticulosis without active inflammatory changes. There is no bowel obstruction. The appendix is not visualized with certainty. No inflammatory changes identified in the right lower quadrant. Vascular/Lymphatic: Advanced aortoiliac atherosclerotic disease. The IVC is unremarkable. No portal venous gas. There is no adenopathy. Reproductive: The uterus and ovaries are grossly unremarkable. No pelvic mass. Other: None Musculoskeletal: Degenerative changes of the spine. No acute osseous pathology. IMPRESSION: 1. Acute on chronic pancreatitis. No abscess or pseudocyst. 2. Colonic diverticulosis. No bowel obstruction. 3. Aortic Atherosclerosis (ICD10-I70.0). Electronically Signed   By: Anner Crete M.D.   On:  09/28/2018 00:19     MDM   Pt presents with epigastric abd pain.  Hx of chronic pancreatitis. Suspect acute on chronic pancreatitis.    2:02 AM CT with evidence of acute on chronic  pancreatitis, but no evidence of abscess.  Pt stable with continued pain. Pt admitted to Dr. Antionette Charpyd.    BP (!) 147/84 (BP Location: Left Arm)   Pulse 88   Temp 98.7 F (37.1 C) (Oral)   Resp 18   SpO2 99%        ICD-10-CM   1. Acute pancreatitis, unspecified complication status, unspecified pancreatitis type  K85.90   2. Intractable vomiting with nausea, unspecified vomiting type  R11.2      Dierdre ForthMuthersbaugh, Earnestine Shipp, Cordelia Poche-C 09/28/18 0303    Zadie RhineWickline, Donald, MD 09/28/18 [phone]

## 2018-09-29 LAB — GLUCOSE, CAPILLARY
Glucose-Capillary: 130 mg/dL — ABNORMAL HIGH (ref 70–99)
Glucose-Capillary: 157 mg/dL — ABNORMAL HIGH (ref 70–99)
Glucose-Capillary: 84 mg/dL (ref 70–99)
Glucose-Capillary: 130 mg/dL — ABNORMAL HIGH (ref 70–99)

## 2018-09-29 LAB — LIPASE, BLOOD: Lipase: 49 U/L (ref 11–51)

## 2018-09-29 MED ORDER — HYDROMORPHONE HCL 1 MG/ML IJ SOLN
0.5000 mg | INTRAMUSCULAR | Status: DC | PRN
  Administered 2018-09-29 – 2018-09-30 (×6): 0.5 mg via INTRAVENOUS
  Filled 2018-09-29 (×7): qty 1

## 2018-09-29 MED ORDER — DICLOFENAC SODIUM 1 % TD GEL
2.0000 g | Freq: Four times a day (QID) | TRANSDERMAL | Status: DC | PRN
  Filled 2018-09-29: qty 100

## 2018-09-29 MED ORDER — SODIUM CHLORIDE 0.9 % IV SOLN
INTRAVENOUS | Status: DC
  Administered 2018-09-29: 05:00:00 via INTRAVENOUS

## 2018-09-29 MED ORDER — PANCRELIPASE (LIP-PROT-AMYL) 12000-38000 UNITS PO CPEP
72000.0000 [IU] | ORAL_CAPSULE | Freq: Three times a day (TID) | ORAL | Status: DC | PRN
  Administered 2018-09-29 – 2018-09-30 (×2): 72000 [IU] via ORAL
  Filled 2018-09-29 (×2): qty 6

## 2018-09-29 MED ORDER — BISOPROLOL FUMARATE 5 MG PO TABS
2.5000 mg | ORAL_TABLET | Freq: Every day | ORAL | Status: DC
  Administered 2018-09-29: 2.5 mg via ORAL
  Filled 2018-09-29: qty 1

## 2018-09-29 MED ORDER — ISOSORBIDE MONONITRATE ER 60 MG PO TB24
60.0000 mg | ORAL_TABLET | Freq: Every day | ORAL | Status: DC
  Administered 2018-09-29: 60 mg via ORAL
  Filled 2018-09-29: qty 1

## 2018-09-29 MED ORDER — CLOPIDOGREL BISULFATE 75 MG PO TABS
75.0000 mg | ORAL_TABLET | Freq: Every day | ORAL | Status: DC
  Administered 2018-09-29 – 2018-09-30 (×2): 75 mg via ORAL
  Filled 2018-09-29 (×2): qty 1

## 2018-09-29 NOTE — Progress Notes (Signed)
Progress Note    [name]  QMV:[id number] DOB: [birth year]  DOA: 09/27/2018 PCP: Gwenlyn FoundEksir, Samantha A, MD    Brief Narrative:     Medical records reviewed and are as summarized below:  Leslie KluverRoxie Epps is an 63 y.o. female  with medical history significant for coronary artery disease, asthma COPD, type 2 diabetes mellitus, hypertension, mild chronic renal insufficiency, and chronic pancreatitis that has been attributed to alcohol, now presenting to the emergency department for evaluation of severe abdominal pain, nausea, vomiting, similar to her prior experiences with pancreatitis flares.  Patient reports that epigastric abdominal pain began approximately 3 days ago, similar to her prior experiences with acute pancreatitis, and now has progressively worsened, becoming severe, radiating through to her back, and associated with nausea and 5-10 episodes of nonbloody vomiting over the past day.  She [facility], her pancreatitis has been attributed to alcohol.  Assessment/Plan:   Principal Problem:   Acute on chronic pancreatitis (HCC) Active Problems:   Essential hypertension   PAD (peripheral artery disease) (HCC)   Renal artery stenosis (HCC)   CAD (coronary artery disease)   Diabetes mellitus with circulatory complication (HCC)   Asthma with COPD (HCC)   CKD (chronic kidney disease), stage III (HCC)   Acute pancreatitis  1. Acute on chronic pancreatitis  - Presents with 3 days of progressive epigastric pain with N/V similar to her prior episodes of acute pancreatitis, and she is found to have lipase 2354 and CT-findings consistent with acute on chronic pancreatitis without fluid-collection  - She follows with GI and her pancreatitis has been attributed to past EtOH use  - She denies any alcohol use in years, LFT's are normal, no ductal dilatation or gallstones seen on CT, and triglycerides were normal in December 2019   - pain control, slowly  advance diet  2. CAD  -  being scheduled for outpatient stress test  - Resume medications once she is able to tolerate a diet    3. Type II DM  - A1c was 6.7% in December 2019 - SSI   4. CKD II-III  - SCr is at baseline  - Renally-dose medications    5. COPD  - Stable  - Continue ICS/LABA and as-needed albuterol   6. Hypertension  - resume home meds   Family Communication/Anticipated D/C date and plan/Code Status   DVT prophylaxis: Lovenox ordered. Code Status: Full Code.  Family Communication:  Disposition Plan: home in AM   Medical Consultants:    None.    Subjective:   Still with pain after eating some  Objective:    Vitals:   09/28/18 2127 09/29/18 0417 09/29/18 0500 09/29/18 0847  BP:  (!) 145/73    Pulse:  78    Resp:  16    Temp:  97.6 F (36.4 C)    TempSrc:  Oral    SpO2: 97% 100%  98%  Weight:   77.5 kg   Height:        Intake/Output Summary (Last 24 hours) at 09/29/2018 1239 Last data filed at 09/29/2018 1055 Gross per 24 hour  Intake 3071.63 ml  Output 1700 ml  Net 1371.63 ml   Filed Weights   09/28/18 0826 09/29/18 0500  Weight: 78.1 kg 77.5 kg    Exam: In bed, NAD +BS, soft, mild epigastric tenderness rrr No increased work of breathing No LE edema  Data Reviewed:   I have personally reviewed following labs and imaging studies:  Labs: Labs show the following:   Basic Metabolic Panel: Recent Labs  Lab 09/27/18 1709 09/28/18 0318  NA 138 137  K 3.9 3.6  CL 103 106  CO2 21* 20*  GLUCOSE 164* 95  BUN 14 13  CREATININE 1.10* 0.98  CALCIUM 10.0 8.8*   GFR Estimated Creatinine Clearance: 59.4 mL/min (by C-G formula based on SCr of 0.98 mg/dL). Liver Function Tests: Recent Labs  Lab 09/27/18 1709 09/28/18 0318  AST 25 18  ALT 22 17  ALKPHOS 64 62  BILITOT 0.7 0.5  PROT 7.0 6.3*  ALBUMIN 4.5 3.9   Recent Labs  Lab 09/27/18 1709 09/28/18 0318 09/29/18 0349  LIPASE 2,354* 674* 49   No results  for input(s): AMMONIA in the last 168 hours. Coagulation profile No results for input(s): INR, PROTIME in the last 168 hours.  CBC: Recent Labs  Lab 09/27/18 1709 09/28/18 0318  WBC 7.8 5.7  HGB 14.2 12.7  HCT 45.1 40.4  MCV 92.8 92.0  PLT 273 246   Cardiac Enzymes: No results for input(s): CKTOTAL, CKMB, CKMBINDEX, TROPONINI in the last 168 hours. BNP (last 3 results) No results for input(s): PROBNP in the last 8760 hours. CBG: Recent Labs  Lab 09/28/18 1226 09/28/18 1623 09/28/18 2019 09/29/18 0749 09/29/18 1155  GLUCAP 112* 134* 214* 130* 157*   D-Dimer: No results for input(s): DDIMER in the last 72 hours. Hgb A1c: Recent Labs    09/28/18 0318  HGBA1C 7.1*   Lipid Profile: Recent Labs    09/28/18 0318  TRIG 130   Thyroid function studies: No results for input(s): TSH, T4TOTAL, T3FREE, THYROIDAB in the last 72 hours.  Invalid input(s): FREET3 Anemia work up: No results for input(s): VITAMINB12, FOLATE, FERRITIN, TIBC, IRON, RETICCTPCT in the last 72 hours. Sepsis Labs: Recent Labs  Lab 09/27/18 1709 09/28/18 0318  WBC 7.8 5.7    Microbiology Recent Results (from the past 240 hour(s))  SARS Coronavirus 2 [facility](Hospital order, Performed in [facility] hospital lab) Nasopharyngeal Nasopharyngeal Swab     Status: None   Collection Time: 09/27/18 10:28 PM   Specimen: Nasopharyngeal Swab  Result Value Ref Range Status   SARS Coronavirus 2 NEGATIVE NEGATIVE Final    Comment: (NOTE) If result is NEGATIVE SARS-CoV-2 target nucleic acids are NOT DETECTED. The SARS-CoV-2 RNA is generally detectable in upper and lower  respiratory specimens during the acute phase of infection. The lowest  concentration of SARS-CoV-2 viral copies this assay can detect is 250  copies / mL. A negative result does not preclude SARS-CoV-2 infection  and should not be used as the sole basis for treatment or other  patient management decisions.  A negative result may occur with    improper specimen collection / handling, submission of specimen other  than nasopharyngeal swab, presence of viral mutation(s) within the  areas targeted by this assay, and inadequate number of viral copies  (<250 copies / mL). A negative result must be combined with clinical  observations, patient history, and epidemiological information. If result is POSITIVE SARS-CoV-2 target nucleic acids are DETECTED. The SARS-CoV-2 RNA is generally detectable in upper and lower  respiratory specimens dur ing the acute phase of infection.  Positive  results are indicative of active infection with SARS-CoV-2.  Clinical  correlation with patient history and other diagnostic information is  necessary to determine patient infection status.  Positive results do  not rule out bacterial infection or co-infection with other viruses. If result is PRESUMPTIVE POSTIVE SARS-CoV-2  nucleic acids MAY BE PRESENT.   A presumptive positive result was obtained on the submitted specimen  and confirmed on repeat testing.  While 2019 novel coronavirus  (SARS-CoV-2) nucleic acids may be present in the submitted sample  additional confirmatory testing may be necessary for epidemiological  and / or clinical management purposes  to differentiate between  SARS-CoV-2 and other Sarbecovirus currently known to infect humans.  If clinically indicated additional testing with an alternate test  methodology [phone]([id number]) is advised. The SARS-CoV-2 RNA is generally  detectable in upper and lower respiratory sp ecimens during the acute  phase of infection. The expected result is Negative. Fact Sheet for Patients:  BoilerBrush.com.cyhttps://www.fda.gov/media/136312/download Fact Sheet for Healthcare Providers: https://pope.com/https://www.fda.gov/media/136313/download This test is not yet approved or cleared by the [place] FDA and has been authorized for detection and/or diagnosis of SARS-CoV-2 by FDA under an Emergency Use Authorization (EUA).  This EUA will  remain in effect (meaning this test can be used) for the duration of the COVID-19 declaration under Section 564(b)(1) of the Act, 21 U.S.C. section 360bbb-3(b)(1), unless the authorization is terminated or revoked sooner. Performed at [facility], [address] [place], [place] [address]     Procedures and diagnostic studies:  Ct Abdomen Pelvis W Contrast  Result Date: 09/28/2018 CLINICAL DATA:  63 year old female with history of pancreatitis presenting with abdominal pain and elevated lipase. EXAM: CT ABDOMEN AND PELVIS WITH CONTRAST TECHNIQUE: Multidetector CT imaging of the abdomen and pelvis was performed using the standard protocol following bolus administration of intravenous contrast. CONTRAST:  100mL OMNIPAQUE IOHEXOL 300 MG/ML  SOLN COMPARISON:  CT of the abdomen pelvis dated 12/08/2017 FINDINGS: Lower chest: The visualized lung bases are clear. There is multi vessel coronary vascular calcification. No intra-abdominal free air or free fluid. Hepatobiliary: Probable mild fatty infiltration the liver. No intrahepatic biliary ductal dilatation. No calcified gallstone or pericholecystic fluid. Pancreas: Scattered coarse pancreatic calcification primarily involving the head and uncinate process of the pancreas sequela of chronic pancreatitis. There is diffuse edema and peripancreatic stranding consistent with acute on chronic pancreatitis. There is no drainable fluid collection/abscess or pseudocyst. Spleen: Normal in size without focal abnormality. Adrenals/Urinary Tract: The adrenal glands are unremarkable. Punctate nonobstructing left renal inferior pole calculus. There is no hydronephrosis on either side. There is symmetric enhancement and excretion of contrast by both kidneys. Several bilateral hypodense lesions measure up to 12 mm, suboptimally characterized. The larger lesions appears similar to prior CT and may represent cysts. The visualized ureters are unremarkable. The urinary  bladder is collapsed. Stomach/Bowel: There is moderate stool throughout the colon. There is sigmoid diverticulosis without active inflammatory changes. There is no bowel obstruction. The appendix is not visualized with certainty. No inflammatory changes identified in the right lower quadrant. Vascular/Lymphatic: Advanced aortoiliac atherosclerotic disease. The IVC is unremarkable. No portal venous gas. There is no adenopathy. Reproductive: The uterus and ovaries are grossly unremarkable. No pelvic mass. Other: None Musculoskeletal: Degenerative changes of the spine. No acute osseous pathology. IMPRESSION: 1. Acute on chronic pancreatitis. No abscess or pseudocyst. 2. Colonic diverticulosis. No bowel obstruction. 3. Aortic Atherosclerosis (ICD10-I70.0). Electronically Signed   By: Elgie CollardArash  Radparvar M.D.   On: 09/28/2018 00:19    Medications:    enoxaparin (LOVENOX) injection  40 mg Subcutaneous Daily   insulin aspart  0-5 Units Subcutaneous QHS   insulin aspart  0-9 Units Subcutaneous TID WC   mometasone-formoterol  2 puff Inhalation BID   Continuous Infusions:   LOS: 1  day   [facility]   How to contact the [facility] Attending or Consulting provider [place] or covering provider during after hours [place], for this patient?  1. Check the care team in [facility] and look for a) attending/consulting [facility] provider listed and b) the [facility] team listed 2. Log into [email or web address] and use [place]'s universal password to access. If you do not have the password, please contact the hospital operator. 3. Locate the [facility] provider you are looking for under [facility] and page to a number that you can be directly reached. 4. If you still have difficulty reaching the provider, please page the [facility] (Director on Call) for the Hospitalists listed on amion for assistance.  09/29/2018, 12:39 PM

## 2018-09-30 LAB — GLUCOSE, CAPILLARY
Glucose-Capillary: 121 mg/dL — ABNORMAL HIGH (ref 70–99)
Glucose-Capillary: 137 mg/dL — ABNORMAL HIGH (ref 70–99)

## 2018-09-30 MED ORDER — HYDROCODONE-ACETAMINOPHEN 5-325 MG PO TABS: 1.0000 | ORAL_TABLET | Freq: Four times a day (QID) | ORAL | 0 refills | Status: AC

## 2018-09-30 NOTE — Discharge Summary (Signed)
Physician Discharge Summary  [name] CBS:[id number] DOB: 1955/08/01 DOA: 09/27/2018  PCP: Nickola Major, MD  Admit date: 09/27/2018 Discharge date: 09/30/2018  Admitted From: home Discharge disposition: home   Recommendations for Outpatient Follow-Up:   1. GI follow up for pancreatitis   Discharge Diagnosis:   Principal Problem:   Acute on chronic pancreatitis ([place]) Active Problems:   Essential hypertension   PAD (peripheral artery disease) ([place])   Renal artery stenosis ([place])   CAD (coronary artery disease)   Diabetes mellitus with circulatory complication ([place])   Asthma with COPD ([place])   CKD (chronic kidney disease), stage III ([place])   Acute pancreatitis    Discharge Condition: Improved.  Diet recommendation: Low sodium, heart healthy.  Carbohydrate-modified.  Wound care: None.  Code status: Full.   History of Present Illness:   [name] is a 63 y.o. 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  Patient reports that epigastric abdominal pain began approximately 3 days ago, similar to her prior experiences with acute pancreatitis, and now has progressively worsened, becoming severe, radiating through to her back, and associated with nausea and 5-10 episodes of nonbloody vomiting over the past day.  She follows with gastroenterology with [facility], her pancreatitis has been attributed to alcohol, but the patient reports that she has not had any alcohol in several years.  She denies fevers or chills.  She denies cough or shortness of breath.    Hospital Course by Problem:   1.Acute on chronic pancreatitis -Presents  with 3 days of progressive epigastric pain with N/V similar to her prior episodes of acute pancreatitis, and she is found to have lipase 2354 and CT-findings consistent with acute on chronic pancreatitis without fluid-collection -She follows with GI and her pancreatitis has been attributed to past EtOH use -She denies any alcohol use in years, LFT's are normal, no ductal dilatation or gallstones seen on CT, and triglycerides were normal in December 2019 -pain controlled, slowly advance diet  2.CAD - being scheduled for outpatient stress test -Resume home meds  3.Type II DM -A1c was 6.7% in December 2019 -SSI  4.CKD II-III -SCr is at baseline -Renally-dose medications  5.COPD -Stable -Continue ICS/LABA and as-needed albuterol  6.Hypertension -resume home meds     Medical Consultants:      Discharge Exam:   Vitals:   09/30/18 0543 09/30/18 0825  BP: 116/76   Pulse: 74   Resp: 19   Temp: 98.1 F (36.7 C)   SpO2: 99% 98%   Vitals:   09/29/18 2019 09/30/18 0500 09/30/18 0543 09/30/18 0825  BP: 123/79  116/76   Pulse: 71  74   Resp: 18  19   Temp: 98 F (36.7 C)  98.1 F (36.7 C)   TempSrc: Oral  Oral   SpO2: 100%  99% 98%  Weight:  76.1 kg    Height:        General exam: Appears calm and comfortable.    The results of significant diagnostics from this hospitalization (including imaging, microbiology, ancillary and laboratory) are listed below for reference.     Procedures and Diagnostic Studies:   Ct Abdomen Pelvis W Contrast  Result Date: 09/28/2018 CLINICAL DATA:  63 year old female  with history of pancreatitis presenting with abdominal pain and elevated lipase. EXAM: CT ABDOMEN AND PELVIS WITH CONTRAST TECHNIQUE: Multidetector CT imaging of the abdomen and pelvis was performed using the standard protocol following bolus administration of intravenous contrast. CONTRAST:  180m OMNIPAQUE IOHEXOL 300 MG/ML  SOLN  COMPARISON:  CT of the abdomen pelvis dated 12/08/2017 FINDINGS: Lower chest: The visualized lung bases are clear. There is multi vessel coronary vascular calcification. No intra-abdominal free air or free fluid. Hepatobiliary: Probable mild fatty infiltration the liver. No intrahepatic biliary ductal dilatation. No calcified gallstone or pericholecystic fluid. Pancreas: Scattered coarse pancreatic calcification primarily involving the head and uncinate process of the pancreas sequela of chronic pancreatitis. There is diffuse edema and peripancreatic stranding consistent with acute on chronic pancreatitis. There is no drainable fluid collection/abscess or pseudocyst. Spleen: Normal in size without focal abnormality. Adrenals/Urinary Tract: The adrenal glands are unremarkable. Punctate nonobstructing left renal inferior pole calculus. There is no hydronephrosis on either side. There is symmetric enhancement and excretion of contrast by both kidneys. Several bilateral hypodense lesions measure up to 12 mm, suboptimally characterized. The larger lesions appears similar to prior CT and may represent cysts. The visualized ureters are unremarkable. The urinary bladder is collapsed. Stomach/Bowel: There is moderate stool throughout the colon. There is sigmoid diverticulosis without active inflammatory changes. There is no bowel obstruction. The appendix is not visualized with certainty. No inflammatory changes identified in the right lower quadrant. Vascular/Lymphatic: Advanced aortoiliac atherosclerotic disease. The IVC is unremarkable. No portal venous gas. There is no adenopathy. Reproductive: The uterus and ovaries are grossly unremarkable. No pelvic mass. Other: None Musculoskeletal: Degenerative changes of the spine. No acute osseous pathology. IMPRESSION: 1. Acute on chronic pancreatitis. No abscess or pseudocyst. 2. Colonic diverticulosis. No bowel obstruction. 3. Aortic Atherosclerosis (ICD10-I70.0).  Electronically Signed   By: AAnner CreteM.D.   On: 09/28/2018 00:19     Labs:   Basic Metabolic Panel: Recent Labs  Lab 09/27/18 1709 09/28/18 0318  NA 138 137  K 3.9 3.6  CL 103 106  CO2 21* 20*  GLUCOSE 164* 95  BUN 14 13  CREATININE 1.10* 0.98  CALCIUM 10.0 8.8*   GFR Estimated Creatinine Clearance: 58.9 mL/min (by C-G formula based on SCr of 0.98 mg/dL). Liver Function Tests: Recent Labs  Lab 09/27/18 1709 09/28/18 0318  AST 25 18  ALT 22 17  ALKPHOS 64 62  BILITOT 0.7 0.5  PROT 7.0 6.3*  ALBUMIN 4.5 3.9   Recent Labs  Lab 09/27/18 1709 09/28/18 0318 09/29/18 0349  LIPASE 2,354* 674* 49   No results for input(s): AMMONIA in the last 168 hours. Coagulation profile No results for input(s): INR, PROTIME in the last 168 hours.  CBC: Recent Labs  Lab 09/27/18 1709 09/28/18 0318  WBC 7.8 5.7  HGB 14.2 12.7  HCT 45.1 40.4  MCV 92.8 92.0  PLT 273 246   Cardiac Enzymes: No results for input(s): CKTOTAL, CKMB, CKMBINDEX, TROPONINI in the last 168 hours. BNP: Invalid input(s): POCBNP CBG: Recent Labs  Lab 09/29/18 1155 09/29/18 1611 09/29/18 2100 09/30/18 0746 09/30/18 1134  GLUCAP 157* 84 130* 121* 137*   D-Dimer No results for input(s): DDIMER in the last 72 hours. Hgb A1c Recent Labs    09/28/18 0318  HGBA1C 7.1*   Lipid Profile Recent Labs    09/28/18 0318  TRIG 130   Thyroid function studies No results for input(s): TSH, T4TOTAL, T3FREE, THYROIDAB in the last 72 hours.  Invalid input(s):  FREET3 Anemia work up No results for input(s): VITAMINB12, FOLATE, FERRITIN, TIBC, IRON, RETICCTPCT in the last 72 hours. Microbiology Recent Results (from the past 240 hour(s))  SARS Coronavirus 2 [facility] order, Performed in [facility] hospital lab) Nasopharyngeal Nasopharyngeal Swab     Status: None   Collection Time: 09/27/18 10:28 PM   Specimen: Nasopharyngeal Swab  Result Value Ref Range Status   SARS Coronavirus 2 NEGATIVE  NEGATIVE Final    Comment: (NOTE) If result is NEGATIVE SARS-CoV-2 target nucleic acids are NOT DETECTED. The SARS-CoV-2 RNA is generally detectable in upper and lower  respiratory specimens during the acute phase of infection. The lowest  concentration of SARS-CoV-2 viral copies this assay can detect is 250  copies / mL. A negative result does not preclude SARS-CoV-2 infection  and should not be used as the sole basis for treatment or other  patient management decisions.  A negative result may occur with  improper specimen collection / handling, submission of specimen other  than nasopharyngeal swab, presence of viral mutation(s) within the  areas targeted by this assay, and inadequate number of viral copies  (<250 copies / mL). A negative result must be combined with clinical  observations, patient history, and epidemiological information. If result is POSITIVE SARS-CoV-2 target nucleic acids are DETECTED. The SARS-CoV-2 RNA is generally detectable in upper and lower  respiratory specimens dur ing the acute phase of infection.  Positive  results are indicative of active infection with SARS-CoV-2.  Clinical  correlation with patient history and other diagnostic information is  necessary to determine patient infection status.  Positive results do  not rule out bacterial infection or co-infection with other viruses. If result is PRESUMPTIVE POSTIVE SARS-CoV-2 nucleic acids MAY BE PRESENT.   A presumptive positive result was obtained on the submitted specimen  and confirmed on repeat testing.  While 2019 novel coronavirus  (SARS-CoV-2) nucleic acids may be present in the submitted sample  additional confirmatory testing may be necessary for epidemiological  and / or clinical management purposes  to differentiate between  SARS-CoV-2 and other Sarbecovirus currently known to infect humans.  If clinically indicated additional testing with an alternate test  methodology [phone]) is  advised. The SARS-CoV-2 RNA is generally  detectable in upper and lower respiratory sp ecimens during the acute  phase of infection. The expected result is Negative. Fact Sheet for Patients:  StrictlyIdeas.no Fact Sheet for Healthcare Providers: BankingDealers.co.za This test is not yet approved or cleared by the [place] FDA and has been authorized for detection and/or diagnosis of SARS-CoV-2 by FDA under an Emergency Use Authorization (EUA).  This EUA will remain in effect (meaning this test can be used) for the duration of the COVID-19 declaration under Section 564(b)(1) of the Act, 21 U.S.C. section 360bbb-3(b)(1), unless the authorization is terminated or revoked sooner. Performed at [facility], [place] [address] [place], [place] [address]      Discharge Instructions:   Discharge Instructions    Discharge instructions   Complete by: As directed    Carb mod diet-- can back down to a full liquid diet if needed   Increase activity slowly   Complete by: As directed      Allergies as of 09/30/2018      Reactions   Gabapentin Other (See Comments)   Brain shakes   Glucotrol [glipizide] Rash, Other (See Comments)   Red rash   Influenza Vaccines Other (See Comments)   Significant arm soreness requiring 1 year  of physical therapy   Latex Rash   Nortriptyline Other (See Comments)   BRAIN SHAKE   Tetanus Toxoid Hives, Swelling, Rash, Other (See Comments)   Swelling to site of injection with rash and fever   Invokana [canagliflozin] Other (See Comments)   Yeast Infections      Medication List    TAKE these medications   acetaminophen 500 MG tablet Commonly known as: TYLENOL Take 1,000 mg by mouth every 6 (six) hours as needed for moderate pain or headache.   Advair Diskus 250-50 MCG/DOSE Aepb Generic drug: Fluticasone-Salmeterol INHALE 1 PUFF BY MOUTH INTO THE LUNGS EVERY 12 HOURS What changed: See the new  instructions.   aspirin 81 MG tablet Take 81 mg by mouth daily.   Landscape architect w/Device Kit CHECK BLOOD SUGAR EVERY DAY   bisoprolol 5 MG tablet Commonly known as: ZEBETA TAKE ONE-HALF TABLET BY MOUTH EVERY DAY What changed: when to take this   clopidogrel 75 MG tablet Commonly known as: PLAVIX Take 1 tablet (75 mg total) by mouth daily.   Contour Next Test test strip Generic drug: glucose blood CHECK BLOOD SUGAR DAILY AS DIRECTED   Creon 36000 UNITS Cpep capsule Generic drug: lipase/protease/amylase TAKE 2 CAPSULES BY MOUTH THREE TIMES DAILY BEFORE MEALS What changed: See the new instructions.   diclofenac sodium 1 % Gel Commonly known as: Voltaren Apply 2 g topically 4 (four) times daily. What changed:   when to take this  reasons to take this   HAIR/SKIN/NAILS PO Take 1 tablet by mouth daily.   HYDROcodone-acetaminophen 5-325 MG tablet Commonly known as: NORCO/VICODIN Take 1 tablet by mouth 4 (four) times daily for 3 days.   isosorbide mononitrate 60 MG 24 hr tablet Commonly known as: IMDUR TAKE 1 TABLET BY MOUTH DAILY What changed: when to take this   metFORMIN 1000 MG tablet Commonly known as: GLUCOPHAGE TAKE 1 TABLET BY MOUTH TWICE DAILY WITH MEALS   methocarbamol 500 MG tablet Commonly known as: ROBAXIN TAKE 1 TABLET BY MOUTH EVERY 6 HOURS AS NEEDED FOR MUSCLE SPASMS What changed: See the new instructions.   multivitamin tablet Take 1 tablet by mouth daily.   nitroGLYCERIN 0.4 MG SL tablet Commonly known as: NITROSTAT Place 1 tablet under the tongue every 5 (five) minutes as needed for chest pain.   ondansetron 4 MG tablet Commonly known as: ZOFRAN TAKE 1 TABLET BY MOUTH EVERY 8 HOURS AS NEEDED FOR NAUSEA AND VOMITING What changed: See the new instructions.   pantoprazole 40 MG tablet Commonly known as: PROTONIX TAKE 1 TABLET BY MOUTH DAILY What changed:   how to take this  when to take this   pioglitazone 30 MG tablet  Commonly known as: Actos Take 1 tablet (30 mg total) by mouth daily.   rosuvastatin 40 MG tablet Commonly known as: CRESTOR Take 1 tablet (40 mg total) by mouth at bedtime.   TRUBIOTICS PO Take 1 capsule by mouth daily.   Ventolin HFA 108 (90 Base) MCG/ACT inhaler Generic drug: albuterol INHALE 2 PUFFS BY MOUTH TWICE DAILY AS NEEDED FOR WHEEZING OR SHORTNESS OF BREATH What changed: See the new instructions.      Follow-up Information    Nickola Major, MD Follow up in 1 week(s).   Specialty: Family Medicine Contact information: [address] [place] [address] [phone]            Time coordinating discharge: 35 min  Signed:  Geradine Girt DO  [facility] 09/30/2018, 12:55 PM

## 2018-09-30 NOTE — Discharge Instructions (Signed)
Acute Pancreatitis  Acute pancreatitis happens when the pancreas gets swollen. The pancreas is a large gland in the body that helps to control blood sugar. It also makes enzymes that help to digest food. This condition can last a few days and cause serious problems. The lungs, heart, and kidneys may stop working. What are the causes? Causes include:  Alcohol abuse.  Drug abuse.  Gallstones.  A tumor in the pancreas. Other causes include:  Some medicines.  Some chemicals.  Diabetes.  An infection.  Damage caused by an accident.  The poison (venom) from a scorpion bite.  Belly (abdominal) surgery.  The body's defense system (immune system) attacking the pancreas (autoimmune pancreatitis).  Genes that are passed from parent to child (inherited). In some cases, the cause is not known. What are the signs or symptoms?  Pain in the upper belly that may be felt in the back. The pain may be very bad.  Swelling of the belly.  Feeling sick to your stomach (nauseous) and throwing up (vomiting).  Fever. How is this treated? You will likely have to stay in the hospital. Treatment may include:  Pain medicine.  Fluid through an IV tube.  Placing a tube in the stomach to take out the stomach contents. This may help you stop throwing up.  Not eating for 3-4 days.  Antibiotic medicines, if you have an infection.  Treating any other problems that may be the cause.  Steroid medicines, if your problem is caused by your defense system attacking your body's own tissues.  Surgery. Follow these instructions at home: Eating and drinking   Follow instructions from your doctor about what to eat and drink.  Eat foods that do not have a lot of fat in them.  Eat small meals often. Do not eat big meals.  Drink enough fluid to keep your pee (urine) pale yellow.  Do not drink alcohol if it caused your condition. Medicines  Take over-the-counter and prescription medicines  only as told by your doctor.  Ask your doctor if the medicine prescribed to you: ? Requires you to avoid driving or using heavy machinery. ? Can cause trouble pooping (constipation). You may need to take steps to prevent or treat trouble pooping:  Take over-the-counter or prescription medicines.  Eat foods that are high in fiber. These include beans, whole grains, and fresh fruits and vegetables.  Limit foods that are high in fat and sugar. These include fried or sweet foods. General instructions  Do not use any products that contain nicotine or tobacco, such as cigarettes, e-cigarettes, and chewing tobacco. If you need help quitting, ask your doctor.  Get plenty of rest.  Check your blood sugar at home as told by your doctor.  Keep all follow-up visits as told by your doctor. This is important. Contact a doctor if:  You do not get better as quickly as expected.  You have new symptoms.  Your symptoms get worse.  You have pain or weakness that lasts a long time.  You keep feeling sick to your stomach.  You get better and then you have pain again.  You have a fever. Get help right away if:  You cannot eat or keep fluids down.  Your pain gets very bad.  Your skin or the white part of your eyes turns yellow.  You have sudden swelling in your belly.  You throw up.  You feel dizzy or you pass out (faint).  Your blood sugar is high (over  mg/dL). °Summary °· Acute pancreatitis happens when the pancreas gets swollen. °· This condition is often caused by alcohol abuse, drug abuse, or gallstones. °· You will likely have to stay in the hospital for treatment. °This information is not intended to replace advice given to you by your health care provider. Make sure you discuss any questions you have with your health care provider. °Document Released: 07/20/2007 Document Revised: 11/20/2017 Document Reviewed: 11/20/2017 °Elsevier Patient Education © 2020 [employer] Inc[employer] ° ° °Chronic  Pancreatitis ° °Chronic pancreatitis is long-lasting inflammation and scarring of the pancreas. The pancreas is a gland that is located behind the stomach. It makes enzymes that help to digest food. The pancreas also releases hormones called glucagon and insulin, which help regulate blood sugar (glucose). Damage to the pancreas may affect digestion, cause pain in the upper abdomen and back, and cause diabetes. Inflammation can also irritate other organs in the abdomen near the pancreas. °At first, pancreatitis may be sudden (acute). If you have several or prolonged episodes of acute pancreatitis, the condition can turn into chronic pancreatitis. °What are the causes? °The most common cause of this condition is alcohol abuse. Other causes include: °· High (elevated) levels of triglycerides in the blood (hypertriglyceridemia). °· Gallstones or other conditions that can block the tube that drains the pancreas (pancreatic duct). °· Pancreatic cancer. °· Cystic fibrosis. °· Too much calcium in the blood (hypercalcemia), which may be caused by an overactive parathyroid gland (hyperparathyroidism). °· Certain medicines. °· Injury to the pancreas. °· Infection. °· Autoimmune pancreatitis. This is when the body's disease-fighting (immune) system attacks the pancreas. °· Genes that are passed from parent to child (inherited). °In some cases, the cause may not be known. °What increases the risk? °This condition is more likely to develop in: °· Men. °· People who are 35-55 years old. °· People who have a family history of pancreatitis. °· People who smoke tobacco. °· People who drink large amounts of alcohol over a long period of time. °What are the signs or symptoms? °Symptoms of this condition may include: °· Pain in the abdomen or upper back. Pain may get worse after eating. °· Nausea and vomiting. °· Fever. °· Weight loss. °· A change in the color and consistency of bowel movements, such as stools that are oily, fatty, or  clay-colored. °How is this diagnosed? °This condition is diagnosed based on your symptoms, your medical history, and a physical exam. You may have tests, such as: °· Blood tests. °· Stool samples. °· Biopsy of the pancreas. This is the removal of a small amount of pancreas tissue to be tested in a lab. °· Imaging tests, such as: °? X-rays. °? CT scan. °? MRI. °? Ultrasound. °How is this treated? °You may need to be treated at a hospital. Treatment may involve: °· Resting the pancreas. You may need to stop eating and drinking for a few days to give your pancreas time to recover. During this time, you will be given IV fluids to keep you hydrated. °· Controlling pain. You may be given pain medicines by mouth (orally) or as injections. °· Improving digestion. You may be given: °? Medicines to replace your pancreatic enzymes. °? Vitamin supplements. °? A specific diet to follow. You may work with a diet and nutrition specialist (dietitian) to make an eating plan. °· Surgery to: °? Clear the pancreatic ducts of any blockages, such as gallstones. °? Remove any fluid or damaged tissue from the pancreas. °Other treatments may include: °·   Preventing diabetes. Your health care provider may recommend that you: °? Get regular screening tests for diabetes. °? Monitor your blood glucose regularly. °· Lifestyle changes, such as stopping alcohol use. °· Steroid medicines, if your condition is caused by your immune system attacking your body's own tissues (autoimmune disease). °Follow these instructions at home: °Eating and drinking ° °  ° °· Do not drink alcohol. If you need help quitting, ask your health care provider. °· Follow a diet as told by your health care provider or dietitian, if this applies. This may include: °? Limiting how much fat you eat. °? Eating smaller meals more often. °? Avoiding caffeine. °· Drink enough fluid to keep your urine pale yellow. °General instructions °· Take over-the-counter and prescription  medicines only as told by your health care provider. These include vitamin supplements. °· Do not drive or use heavy machinery while taking prescription pain medicine. °· If you are taking prescription pain medicine, take actions to prevent or treat constipation. Your health care provider may recommend that you: °? Take an over-the-counter or prescription medicine for constipation. °? Eat foods that are high in fiber such as whole grains and beans. °? Limit foods that are high in fat and processed sugars, such as fried or sweet foods. °· Do not use any products that contain nicotine or tobacco, such as cigarettes and e-cigarettes. If you need help quitting, ask your health care provider. °· If recommended by your health care provider, monitor your blood glucose at home. °· Keep all follow-up visits as told by your health care provider. This is important. °Contact a health care provider if: °· You have pain that does not get better with medicine. °· You have a fever. °· You have sudden weight loss. °Get help right away if: °· Your pain suddenly gets worse. °· You have sudden swelling in your abdomen. °· You start to vomit often. °· You vomit blood. °· You have diarrhea that does not go away. °· You have blood in your stool. °· You become confused or you have trouble thinking clearly. °Summary °· Chronic pancreatitis is long-lasting inflammation and scarring of the pancreas. Damage to the pancreas may affect digestion, cause pain in the upper abdomen and back, and cause diabetes. Inflammation can also irritate other organs in the abdomen near the pancreas. °· Common causes of this condition are alcohol abuse, gallstones, high (elevated) levels of triglycerides, and certain medicines. °· This condition is sometimes treated at a hospital and may involve resting the pancreas, controlling pain, replacing enzymes, and avoiding alcohol. °This information is not intended to replace advice given to you by your health care  provider. Make sure you discuss any questions you have with your health care provider. °Document Released: 02/27/2015 Document Revised: 11/20/2017 Document Reviewed: 09/30/2016 °Elsevier Patient Education © 2020 Elsevier Inc. ° °

## 2018-10-23 ENCOUNTER — Other Ambulatory Visit: Payer: Self-pay | Admitting: Family Medicine

## 2018-10-23 DIAGNOSIS — K861 Other chronic pancreatitis: Secondary | ICD-10-CM

## 2018-11-21 ENCOUNTER — Other Ambulatory Visit: Payer: Self-pay | Admitting: Family Medicine

## 2018-12-15 ENCOUNTER — Other Ambulatory Visit: Payer: Self-pay | Admitting: Family Medicine

## 2018-12-25 ENCOUNTER — Telehealth: Payer: Self-pay | Admitting: Family Medicine

## 2018-12-25 NOTE — Telephone Encounter (Signed)
Patient called in wanting to know since she now has insurance that Dr. Dennard Schaumann is in network she would like to come back as his patient.  Please advise.  [phone]

## 2018-12-27 NOTE — Telephone Encounter (Signed)
I am okay with

## 2018-12-28 NOTE — Telephone Encounter (Signed)
Thank you I have made an appointment.

## 2019-01-22 ENCOUNTER — Telehealth: Payer: Self-pay | Admitting: Gastroenterology

## 2019-01-22 NOTE — Telephone Encounter (Signed)
Good afternoon Dr. Loletha Carrow, this is a former patient of yours and had to transfer care to Dr. Earlean Shawl due to insurance being out of network.  She stated that her insurance has changed again and would like to resume care with you due to proximity and that she was more content with [place].  Please review her OV notes in [place].  Will you accept this pt back?

## 2019-01-23 NOTE — Telephone Encounter (Signed)
I will see her 

## 2019-01-31 ENCOUNTER — Other Ambulatory Visit: Payer: Self-pay | Admitting: Family Medicine

## 2019-01-31 NOTE — Telephone Encounter (Signed)
Ok to refill 

## 2019-02-18 ENCOUNTER — Ambulatory Visit (INDEPENDENT_AMBULATORY_CARE_PROVIDER_SITE_OTHER): Payer: 59 | Admitting: Family Medicine

## 2019-02-18 ENCOUNTER — Encounter: Payer: Self-pay | Admitting: Family Medicine

## 2019-02-18 ENCOUNTER — Other Ambulatory Visit: Payer: Self-pay

## 2019-02-18 VITALS — BP 108/60 | HR 80 | Temp 98.1°F | Resp 16 | Ht 63.0 in | Wt 154.0 lb

## 2019-02-18 DIAGNOSIS — E785 Hyperlipidemia, unspecified: Secondary | ICD-10-CM

## 2019-02-18 DIAGNOSIS — E1159 Type 2 diabetes mellitus with other circulatory complications: Secondary | ICD-10-CM

## 2019-02-18 DIAGNOSIS — I10 Essential (primary) hypertension: Secondary | ICD-10-CM

## 2019-02-18 DIAGNOSIS — J449 Chronic obstructive pulmonary disease, unspecified: Secondary | ICD-10-CM

## 2019-02-18 DIAGNOSIS — I739 Peripheral vascular disease, unspecified: Secondary | ICD-10-CM

## 2019-02-18 DIAGNOSIS — J4489 Other specified chronic obstructive pulmonary disease: Secondary | ICD-10-CM

## 2019-02-18 DIAGNOSIS — I701 Atherosclerosis of renal artery: Secondary | ICD-10-CM

## 2019-02-18 DIAGNOSIS — N1831 Chronic kidney disease, stage 3a: Secondary | ICD-10-CM

## 2019-02-18 DIAGNOSIS — I2510 Atherosclerotic heart disease of native coronary artery without angina pectoris: Secondary | ICD-10-CM | POA: Diagnosis not present

## 2019-02-18 DIAGNOSIS — M25511 Pain in right shoulder: Secondary | ICD-10-CM

## 2019-02-18 DIAGNOSIS — G8929 Other chronic pain: Secondary | ICD-10-CM

## 2019-02-18 DIAGNOSIS — K861 Other chronic pancreatitis: Secondary | ICD-10-CM

## 2019-02-18 NOTE — Progress Notes (Signed)
Subjective:    Patient ID: [name], female    DOB: 02-08-1956, 64 y.o.   MRN: [id number]  Patient presents for Medication Review/ Refill (is not fasting)   Pt here to f/u chronic medical problems.  She has had to change PCP a few times due to insurance changes.  My first time meeting her review of chart and medications history done in detail   DM- Metformin 1000mg  BID, Actos 30mg  , fasting blood sugar this AM 91, yesterday  81. No hypoglycemia episodes   Last A1C 7.1%   Renal artery stenosis- last seen [facility] but is going to re-establish with Dr. locally  Pancreatitis - Dr. [facility] saw her last due to insurance change , she has chronic flares of abd pain, typically hurts on right RUQ  wants to re-establish with Dr. Kirke Corin, now back on her previous inusrance   She does not  ETOH   She does take zofran daily due to nausea, she is on creon 3 with each meal   She did have some plaque in the celiac axias, but no stenosis   CAD- due to liver functionit was recommended she D/C plavix and Crestor  cardiologist- Dr. Kinnie Scales seen at [facility] but   She is Imdur and bisoprolol 2.5mg  daily , asa 81MG    she was last seen by Dr. Desma Maxim   PAD- she has clots in both legs, has stent in right leg  05/27/15 RSFA  GERD- protonix   Cataracts- Dr.    Asthma- has she has advair and aluterol prn , she quit smoking 12 years ago    Bilat knee replacement-, she has nerve conduction in her leg as well.  She takes hydrocodone 5-325mg   every 6 hours for chronic abd pain and leg pain, she gets  130 tablets a month, prescribed by her PCP   last filled 12/23  using voltaren gel as needed-for athritis in hands Also on robaxin for muscle spasm   Shehas chronic right shoulder pain was followed by ortho, would like to reestablish care.  States that they were talking MRI and may be doing something with her shoulder.  Mammogram UTD- had knot on right breast , done 6 months ago   With flu  shot had severe myalgias, could not lift arm, had PT to recover   Due for PAP Smear      Review Of Systems:  GEN- denies fatigue, fever, weight loss,weakness, recent illness HEENT- denies eye drainage, change in vision, nasal discharge, CVS- denies chest pain, palpitations RESP- denies SOB, cough, wheeze ABD- denies N/V, change in stools, abd pain GU- denies dysuria, hematuria, dribbling, incontinence MSK- + joint pain, muscle aches, injury Neuro- denies headache, dizziness, syncope, seizure activity       Objective:    BP 108/60   Pulse 80   Temp 98.1 F (36.7 C) (Temporal)   Resp 16   Ht 5\' 3"  (1.6 m)   Wt 154 lb (69.9 kg)   SpO2 98%   BMI 27.28 kg/m  GEN- NAD, alert and oriented x3 HEENT- PERRL, EOMI, non injected sclera, pink conjunctiva, MMM, oropharynx clear Neck- Supple, no thyromegaly CVS- RRR, no murmur RESP-CTAB ABD-NABS,soft,NT,ND EXT- trace ( chronic) left ankle edema Pulses- Radial, DP- 2+        Assessment & Plan:      Problem List Items Addressed This Visit      Unprioritized   Asthma with COPD (HCC)    No recent exacerbations.  She  does have her inhalers on hand.      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  Recheck A1c goal is less than 7%.  No change to oral medications.  Continue checking blood sugar fasting      Relevant Orders   Hemoglobin A1c (Completed)    Microalbumin/Creatinine Ratio, Urine (Completed)   HM Diabetes Foot Exam (Completed)   Dyslipidemia    sHe is on statin drug.      Essential hypertension - Primary (Chronic)    Pressure is controlled no change in medication.  I will obtain her fasting labs.      Relevant Orders   CBC with Differential (Completed)   Comprehensive metabolic panel (Completed)   PAD (peripheral artery disease) (HCC)   Renal artery stenosis (HCC)    Check her renal function.       Other Visit Diagnoses    Chronic right shoulder pain       We will continue to fill her pain medication here at the office. RE-ESTABLISH WITH [facility]   Relevant Medications   HYDROcodone-acetaminophen (NORCO/VICODIN) 5-325 MG tablet      Note: This dictation was prepared with Dragon dictation along with smaller phrase technology. Any transcriptional errors that result from this process are unintentional.

## 2019-02-18 NOTE — Patient Instructions (Addendum)
Referral to Dr. Kirke Corin  Referral to [facility]  F/U 4 months for Physical

## 2019-02-19 ENCOUNTER — Encounter: Payer: Self-pay | Admitting: Family Medicine

## 2019-02-19 LAB — MICROALBUMIN / CREATININE URINE RATIO
Creatinine, Urine: 16 mg/dL — ABNORMAL LOW (ref 20–275)
Microalb Creat Ratio: 50 mcg/mg creat — ABNORMAL HIGH (ref <30)
Microalb, Ur: 0.8 mg/dL

## 2019-02-19 LAB — COMPREHENSIVE METABOLIC PANEL
AG Ratio: 1.9 (calc) (ref 1.0–2.5)
ALT: 17 U/L (ref 6–29)
AST: 20 U/L (ref 10–35)
Albumin: 4.5 g/dL (ref 3.6–5.1)
Alkaline phosphatase (APISO): 63 U/L (ref 37–153)
BUN: 18 mg/dL (ref 7–25)
CO2: 26 mmol/L (ref 20–32)
Calcium: 9.9 mg/dL (ref 8.6–10.4)
Chloride: 103 mmol/L (ref 98–110)
Creat: 0.9 mg/dL (ref 0.50–0.99)
Globulin: 2.4 g/dL (calc) (ref 1.9–3.7)
Glucose, Bld: 129 mg/dL — ABNORMAL HIGH (ref 65–99)
Potassium: 4.3 mmol/L (ref 3.5–5.3)
Sodium: 140 mmol/L (ref 135–146)
Total Bilirubin: 0.3 mg/dL (ref 0.2–1.2)
Total Protein: 6.9 g/dL (ref 6.1–8.1)

## 2019-02-19 LAB — CBC WITH DIFFERENTIAL/PLATELET
Absolute Monocytes: 392 cells/uL (ref 200–950)
Basophils Absolute: 62 cells/uL (ref 0–200)
Basophils Relative: 1.1 %
Eosinophils Absolute: 330 cells/uL (ref 15–500)
Eosinophils Relative: 5.9 %
HCT: 39.2 % (ref 35.0–45.0)
Hemoglobin: 12.5 g/dL (ref 11.7–15.5)
Lymphs Abs: 1574 cells/uL (ref 850–3900)
MCH: 28.6 pg (ref 27.0–33.0)
MCHC: 31.9 g/dL — ABNORMAL LOW (ref 32.0–36.0)
MCV: 89.7 fL (ref 80.0–100.0)
MPV: 9.1 fL (ref 7.5–12.5)
Monocytes Relative: 7 %
Neutro Abs: 3242 cells/uL (ref 1500–7800)
Neutrophils Relative %: 57.9 %
Platelets: 277 10*3/uL (ref 140–400)
RBC: 4.37 10*6/uL (ref 3.80–5.10)
RDW: 13.4 % (ref 11.0–15.0)
Total Lymphocyte: 28.1 %
WBC: 5.6 10*3/uL (ref 3.8–10.8)

## 2019-02-19 LAB — HEMOGLOBIN A1C
Hgb A1c MFr Bld: 6.3 % of total Hgb — ABNORMAL HIGH (ref <5.7)
Mean Plasma Glucose: 134 (calc)
eAG (mmol/L): 7.4 (calc)

## 2019-02-19 LAB — LIPID PANEL
Cholesterol: 142 mg/dL (ref <200)
HDL: 46 mg/dL — ABNORMAL LOW (ref >=50)
LDL Cholesterol (Calc): 68 mg/dL (calc)
Non-HDL Cholesterol (Calc): 96 mg/dL (calc) (ref <130)
Total CHOL/HDL Ratio: 3.1 (calc) (ref <5.0)
Triglycerides: 226 mg/dL — ABNORMAL HIGH (ref <150)

## 2019-02-19 NOTE — Assessment & Plan Note (Signed)
Pressure is controlled no change in medication.  I will obtain her fasting labs.

## 2019-02-19 NOTE — Assessment & Plan Note (Signed)
Plan to reestablish with low-power cardiology locally now that her insurance covers local physician through [facility] health

## 2019-02-19 NOTE — Assessment & Plan Note (Signed)
Diabetes mellitus which has been fairly well controlled.  Recheck A1c goal is less than 7%.  No change to oral medications.  Continue checking blood sugar fasting

## 2019-02-19 NOTE — Assessment & Plan Note (Signed)
No recent exacerbations.  She does have her inhalers on hand.

## 2019-02-19 NOTE — Assessment & Plan Note (Signed)
Plan per GI on Creon, also chronic pain meds No ETOH or tobacco currently

## 2019-02-19 NOTE — Assessment & Plan Note (Signed)
sHe is on statin drug.

## 2019-02-19 NOTE — Assessment & Plan Note (Signed)
Check her renal function.

## 2019-02-21 ENCOUNTER — Encounter: Payer: Self-pay | Admitting: *Deleted

## 2019-02-28 ENCOUNTER — Telehealth: Payer: Self-pay | Admitting: Cardiovascular Disease

## 2019-02-28 NOTE — Telephone Encounter (Signed)
Returned call to patient left message on personal voice mail ok for husband to wait in hallway at office.Advised chairs are in hallway to sit in.

## 2019-02-28 NOTE — Telephone Encounter (Signed)
Patient states her Husband will be the one to drive  her to her upcoming appointment Tuesday 01-19 with Dr. Kirke Corin. She would like to know if it will be OK for him to sit in the waiting room while she has her appointment. He is disabled himself and she does not want him to have to stay in the car. Please let her know what the office decides.

## 2019-03-01 ENCOUNTER — Encounter: Payer: Self-pay | Admitting: Gastroenterology

## 2019-03-01 ENCOUNTER — Ambulatory Visit: Payer: 59 | Admitting: Gastroenterology

## 2019-03-01 VITALS — BP 132/64 | HR 72 | Temp 98.7°F | Ht 63.0 in | Wt 156.6 lb

## 2019-03-01 DIAGNOSIS — R110 Nausea: Secondary | ICD-10-CM | POA: Diagnosis not present

## 2019-03-01 DIAGNOSIS — K860 Alcohol-induced chronic pancreatitis: Secondary | ICD-10-CM | POA: Diagnosis not present

## 2019-03-01 DIAGNOSIS — R1010 Upper abdominal pain, unspecified: Secondary | ICD-10-CM | POA: Diagnosis not present

## 2019-03-01 NOTE — Patient Instructions (Signed)
If you are age 62 or older, your body mass index should be between 23-30. Your Body mass index is 27.74 kg/m. If this is out of the aforementioned range listed, please consider follow up with your Primary Care Provider.  If you are age 19 or younger, your body mass index should be between 19-25. Your Body mass index is 27.74 kg/m. If this is out of the aformentioned range listed, please consider follow up with your Primary Care Provider.   Follow up in 1 year, or sooner if needed.   It was a pleasure to see you today!  Dr. Myrtie Neither

## 2019-03-01 NOTE — Progress Notes (Signed)
[facility] Progress Note  Chief Complaint: Chronic pancreatitis  Subjective  History: My impression at the time of last office visit on 01/18/18: "It took several months after the episode of acute on chronic pancreatitis when she was hospitalized in late May, but she has finally turned the corner with symptom improvement, decrease in pain and nausea.  Her appetite has improved and weight stabilized.  I believe the pancreatic enzymes have been helpful, and imaging shows that the peripancreatic inflammation and pseudocyst have completely resolved.  There is no pancreatic ductal dilatation to suggest stricture or stone in the PD"   Since I last saw her, her insurance required that she follow-up with a [facility] practice, so she was seen by Dr. Earlie Raveling.  She was hospitalized at [facility] August 2020 with acute pancreatitis, lipase 2354 and CT findings consistent with acute on chronic pancreatitis without fluid collection. Had EUS with Ardis Hughs Aug 2019 - within limits of visualization, no PD stricture or stones seen.  Glady sees me for the first time since the December 2019 office visit.  She has ongoing upper abdominal pain that is usually bandlike in midline but sometimes more toward the right upper quadrant.  It usually occurs more after meals.  She is taking 3 of the 36,000 unit Creon capsules with each meal, since that seem to improve pain more than just 2 capsules.  She will usually take one capsule if she infrequently has a snack. Also has some heartburn and regurgitation, more so at night.  She denies dysphagia or odynophagia, nausea or vomiting.  ROS: Cardiovascular:  no chest pain Respiratory: no dyspnea Arthritic pain Remainder systems negative except as above  The patient's Past Medical, Family and Social History were reviewed and are on file in the EMR. Past Medical History:  Diagnosis Date  . Asthma with COPD ([place])    PATIENT DENIES  . CAD (coronary  artery disease)    a. [place] 2011: Diffuse distal and branch vessel disease - patient managed medically, no interventional options. b. [place] 03/2014 - low risk, no ischemia.  . Complication of anesthesia    slow to wake up with last surgery in 2011   . 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 Dyslipidemia   . Headache    hx of migraines   . Hypertension   . Osteoarthritis    severe right knee  R TKR  . PAD (peripheral artery disease) ([place])    a. Severe stenosis mid right SFA s/p atherectomy 03/25/09. b. peripheral angiography in 12/2011 which showed only about 50% diffuse right SFA stenosis.  . Pancreatitis 2003  . Renal artery stenosis ([place])    a. [place] 2011 - 40-50% left RAS.  . Tobacco use disorder    quit 11/10   Social history, quit smoking about a decade ago, quit alcohol several years ago.  Objective:  Med list reviewed  Current Outpatient Medications:  .  acetaminophen (TYLENOL) 500 MG tablet, Take 1,000 mg by mouth every 6 (six) hours as needed for moderate pain or headache., Disp: , Rfl:  .  ADVAIR DISKUS 250-50 MCG/DOSE AEPB, INHALE 1 PUFF BY MOUTH INTO THE LUNGS EVERY 12 HOURS (Patient taking differently: Inhale 1 puff into the lungs 2 (two) times daily. ), Disp: 60 each, Rfl: 5 .  aspirin 81 MG tablet, Take 81 mg by mouth daily.  , Disp: , Rfl:  .  Biotin w/ Vitamins C & E (HAIR/SKIN/NAILS PO), Take  1 tablet by mouth daily., Disp: , Rfl:  .  bisoprolol (ZEBETA) 5 MG tablet, TAKE ONE-HALF TABLET BY MOUTH EVERY DAY (Patient taking differently: Take 2.5 mg by mouth at bedtime. ), Disp: 90 tablet, Rfl: 3 .  Blood Glucose Monitoring Suppl (BAYER CONTOUR MONITOR) w/Device KIT, CHECK BLOOD SUGAR EVERY DAY, Disp: 1 kit, Rfl: 0 .  clopidogrel (PLAVIX) 75 MG tablet, Take 1 tablet (75 mg total) by mouth daily., Disp: 30 tablet, Rfl: 11 .  CONTOUR NEXT TEST test strip, CHECK BLOOD SUGAR DAILY AS DIRECTED, Disp: 100 strip, Rfl: 3 .  CREON 36000 units CPEP capsule, TAKE 2  CAPSULES BY MOUTH THREE TIMES DAILY BEFORE MEALS (Patient taking differently: Take 72,000 Units by mouth 3 (three) times daily as needed (when eating a meal). ), Disp: 180 capsule, Rfl: 0 .  diclofenac sodium (VOLTAREN) 1 % GEL, Apply 2 g topically 4 (four) times daily. (Patient taking differently: Apply 2 g topically 4 (four) times daily as needed (for hand pain). ), Disp: 100 g, Rfl: 0 .  HYDROcodone-acetaminophen (NORCO/VICODIN) 5-325 MG tablet, Take 1 tablet by mouth every 6 (six) hours as needed for moderate pain. Pain Management, [place], Disp: , Rfl:  .  isosorbide mononitrate (IMDUR) 60 MG 24 hr tablet, TAKE 1 TABLET BY MOUTH DAILY (Patient taking differently: Take 60 mg by mouth at bedtime. ), Disp: 90 tablet, Rfl: 2 .  metFORMIN (GLUCOPHAGE) 1000 MG tablet, TAKE 1 TABLET BY MOUTH TWICE DAILY WITH MEALS, Disp: 180 tablet, Rfl: 0 .  methocarbamol (ROBAXIN) 500 MG tablet, TAKE 1 TABLET BY MOUTH EVERY 6 HOURS AS NEEDED FOR MUSCLE SPASMS, Disp: 120 tablet, Rfl: 2 .  Multiple Vitamin (MULTIVITAMIN) tablet, Take 1 tablet by mouth daily.  , Disp: , Rfl:  .  nitroGLYCERIN (NITROSTAT) 0.4 MG SL tablet, Place 1 tablet under the tongue every 5 (five) minutes as needed for chest pain., Disp: , Rfl:  .  ondansetron (ZOFRAN) 4 MG tablet, TAKE 1 TABLET BY MOUTH EVERY 8 HOURS AS NEEDED FOR NAUSEA OR VOMITING, Disp: 30 tablet, Rfl: 2 .  pantoprazole (PROTONIX) 40 MG tablet, TAKE 1 TABLET BY MOUTH DAILY (Patient taking differently: 40 mg. ), Disp: 90 tablet, Rfl: 3 .  pioglitazone (ACTOS) 30 MG tablet, Take 1 tablet (30 mg total) by mouth daily., Disp: 90 tablet, Rfl: 3 .  Probiotic Product (TRUBIOTICS PO), Take 1 capsule by mouth daily., Disp: , Rfl:  .  rosuvastatin (CRESTOR) 40 MG tablet, Take 1 tablet (40 mg total) by mouth at bedtime., Disp: 90 tablet, Rfl: 3 .  VENTOLIN HFA 108 (90 Base) MCG/ACT inhaler, INHALE 2 PUFFS BY MOUTH TWICE DAILY AS NEEDED FOR WHEEZING OR SHORTNESS OF BREATH (Patient taking  differently: Inhale 1 puff into the lungs 2 (two) times daily as needed for wheezing or shortness of breath. ), Disp: 18 g, Rfl: 3   Vital signs in last 24 hrs: Vitals:   03/01/19 0906  BP: 132/64  Pulse: 72  Temp: 98.7 F (37.1 C)  SpO2: 98%    Physical Exam  Well-appearing, pleasant in conversation  HEENT: sclera anicteric, oral mucosa moist without lesions  Neck: supple, no thyromegaly, JVD or lymphadenopathy  Cardiac: RRR without murmurs, S1S2 heard, no peripheral edema  Pulm: clear to auscultation bilaterally, normal RR and effort noted  Abdomen: soft, upper midline tenderness, with active bowel sounds. No guarding or palpable hepatosplenomegaly.  Skin; warm and dry, no jaundice or rash  Recent Labs:  CBC    Component  Value Date/Time   WBC 5.6 02/18/2019 1121   RBC 4.37 02/18/2019 1121   HGB 12.5 02/18/2019 1121   HCT 39.2 02/18/2019 1121   PLT 277 02/18/2019 1121   MCV 89.7 02/18/2019 1121   MCH 28.6 02/18/2019 1121   MCHC 31.9 (L) 02/18/2019 1121   RDW 13.4 02/18/2019 1121   LYMPHSABS 1,574 02/18/2019 1121   MONOABS 406 11/24/2015 0826   EOSABS 330 02/18/2019 1121   BASOSABS 62 02/18/2019 1121   CMP Latest Ref Rng & Units 02/18/2019 09/28/2018 09/27/2018  Glucose 65 - 99 mg/dL 129(H) 95 164(H)  BUN 7 - 25 mg/dL 18 13 14   Creatinine 0.50 - 0.99 mg/dL 0.90 0.98 1.10(H)  Sodium 135 - 146 mmol/L 140 137 138  Potassium 3.5 - 5.3 mmol/L 4.3 3.6 3.9  Chloride 98 - 110 mmol/L 103 106 103  CO2 20 - 32 mmol/L 26 20(L) 21(L)  Calcium 8.6 - 10.4 mg/dL 9.9 8.8(L) 10.0  Total Protein 6.1 - 8.1 g/dL 6.9 6.3(L) 7.0  Total Bilirubin 0.2 - 1.2 mg/dL 0.3 0.5 0.7  Alkaline Phos 38 - 126 U/L - 62 64  AST 10 - 35 U/L 20 18 25   ALT 6 - 29 U/L 17 17 22    Negative cologuard 04/2017 ________________________________________  Radiologic studies:  CLINICAL DATA:  64 year old female with history of pancreatitis presenting with abdominal pain and elevated lipase.   EXAM: CT  ABDOMEN AND PELVIS WITH CONTRAST   TECHNIQUE: Multidetector CT imaging of the abdomen and pelvis was performed using the standard protocol following bolus administration of intravenous contrast.   CONTRAST:  127m OMNIPAQUE IOHEXOL 300 MG/ML  SOLN   COMPARISON:  CT of the abdomen pelvis dated 12/08/2017   FINDINGS: Lower chest: The visualized lung bases are clear. There is multi vessel coronary vascular calcification.   No intra-abdominal free air or free fluid.   Hepatobiliary: Probable mild fatty infiltration the liver. No intrahepatic biliary ductal dilatation. No calcified gallstone or pericholecystic fluid.   Pancreas: Scattered coarse pancreatic calcification primarily involving the head and uncinate process of the pancreas sequela of chronic pancreatitis. There is diffuse edema and peripancreatic stranding consistent with acute on chronic pancreatitis. There is no drainable fluid collection/abscess or pseudocyst.   Spleen: Normal in size without focal abnormality.   Adrenals/Urinary Tract: The adrenal glands are unremarkable. Punctate nonobstructing left renal inferior pole calculus. There is no hydronephrosis on either side. There is symmetric enhancement and excretion of contrast by both kidneys. Several bilateral hypodense lesions measure up to 12 mm, suboptimally characterized. The larger lesions appears similar to prior CT and may represent cysts. The visualized ureters are unremarkable. The urinary bladder is collapsed.   Stomach/Bowel: There is moderate stool throughout the colon. There is sigmoid diverticulosis without active inflammatory changes. There is no bowel obstruction. The appendix is not visualized with certainty. No inflammatory changes identified in the right lower quadrant.   Vascular/Lymphatic: Advanced aortoiliac atherosclerotic disease. The IVC is unremarkable. No portal venous gas. There is no adenopathy.   Reproductive: The uterus and  ovaries are grossly unremarkable. No pelvic mass.   Other: None   Musculoskeletal: Degenerative changes of the spine. No acute osseous pathology.   IMPRESSION: 1. Acute on chronic pancreatitis. No abscess or pseudocyst. 2. Colonic diverticulosis. No bowel obstruction. 3. Aortic Atherosclerosis (ICD10-I70.0).     Electronically Signed   By: AAnner CreteM.D.   On: 09/28/2018 00:19     @ASSESSMENTPLANBEGIN @ Assessment: Encounter Diagnoses  Name Primary?  . Alcohol-induced  chronic pancreatitis ([place]) Yes  . Upper abdominal pain   . Nausea without vomiting    She reestablish care with me today, we had a long discussion reviewing the nature of her condition, and how she will have episodic acute episodes that may require hospitalization.  EUS about 18 months ago did not find any definite stricture or PD stone upon which intervention could be taken, given the limitations of the ultrasound windows.  That said, the architectural distortion of the pancreas from its chronic damage makes acute episodes occur even if there is just passage of a small stone or debris in the pancreatic duct.  I expect she will always have chronic upper abdominal pain, and she is on chronic opioids managed by her primary care provider for this and arthritic pain.  She has intermittent nausea as well as heartburn, generally under good control with current medications.  She is on appropriate dosing of pancreatic enzyme supplements, which she will need indefinitely.  Amanie tells me she has a sufficient supply, and that her primary care provider had recently been prescribing it.  If she needs refills she may contact me about that.  She will see me in a year or sooner as needed.    Total time 30 minutes, over half spent face-to-face with patient in counseling and coordination of care.   Nelida Meuse III

## 2019-03-04 ENCOUNTER — Encounter: Payer: Self-pay | Admitting: Cardiovascular Disease

## 2019-03-04 NOTE — Telephone Encounter (Signed)
Patient is calling to follow up regarding whether her husband can accompany her during her appointment or not. Patient states she did not receive voicemail. Please call.

## 2019-03-04 NOTE — Telephone Encounter (Signed)
error 

## 2019-03-04 NOTE — Telephone Encounter (Signed)
Notified patient of info below, per [name] LPN

## 2019-03-05 ENCOUNTER — Other Ambulatory Visit: Payer: Self-pay

## 2019-03-05 ENCOUNTER — Ambulatory Visit (INDEPENDENT_AMBULATORY_CARE_PROVIDER_SITE_OTHER): Payer: 59 | Admitting: Cardiovascular Disease

## 2019-03-05 ENCOUNTER — Encounter: Payer: Self-pay | Admitting: Cardiovascular Disease

## 2019-03-05 VITALS — BP 118/71 | HR 75 | Temp 96.9°F | Ht 63.0 in | Wt 155.2 lb

## 2019-03-05 DIAGNOSIS — I739 Peripheral vascular disease, unspecified: Secondary | ICD-10-CM | POA: Diagnosis not present

## 2019-03-05 DIAGNOSIS — E785 Hyperlipidemia, unspecified: Secondary | ICD-10-CM

## 2019-03-05 DIAGNOSIS — I2510 Atherosclerotic heart disease of native coronary artery without angina pectoris: Secondary | ICD-10-CM

## 2019-03-05 MED ORDER — HYDROCODONE-ACETAMINOPHEN 5-325 MG PO TABS: 1.0000 | ORAL_TABLET | Freq: Four times a day (QID) | ORAL | 0 refills | Status: DC | PRN

## 2019-03-05 NOTE — Progress Notes (Signed)
Cardiology Office Note   Date:  03/05/2019   ID:  [name], DOB Jan 04, 1956, MRN [id number]  PCP:  Susy Frizzle, MD  Cardiologist:  Stark Klein chief complaint on file.     History of Present Illness: [name] is a 64 y.o. female who presents for a follow up visit regarding peripheral arterial disease and coronary artery disease. She has known history of peripheral arterial disease status post atherectomy of the right SFA in 2011 by Dr. Julianne Handler. She has other chronic medical conditions including CAD, DM, HTN, and hyperlipidemia. She is a former smoker.  She is s/p bilateral TKR.   She had worsening right leg claudication in April 2017. Angiography showed no significant aortoiliac disease. There was moderate right common femoral artery stenosis and severe discrete stenosis in the proximal right SFA with three-vessel runoff below the knee. There was no significant obstructive disease involving the left lower extremity. A self-expanding stent was placed to the proximal right SFA. She had previous bilateral knee replacement and chronic bilateral leg pain.   She has not been seen in our office for more than 1 year given that her insurance changed last year and could not see [place].  She was seen at [facility].  She had some increased chest pain last year and a stress test was suggested but her symptoms subsequently improved.  She was hospitalized in August with pancreatitis.  She is labeled as chronic pancreatitis due to alcohol use but she denies recent alcohol use. She has been doing reasonably well but he does complain of increased bilateral calf discomfort with walking which is equal in both sides.  She also reports symptoms at rest at night.  Past Medical History:  Diagnosis Date  . Asthma with COPD ([place])    PATIENT DENIES  . CAD (coronary artery disease)    a. [place] 2011: Diffuse distal and branch vessel disease - patient managed medically, no interventional options. b. [place]  03/2014 - low risk, no ischemia.  . Complication of anesthesia    slow to wake up with last surgery in 2011   . Diabetes mellitus with circulatory complication ([place])   . Dyslipidemia   . Headache    hx of migraines   . Hypertension   . Osteoarthritis    severe right knee  R TKR  . PAD (peripheral artery disease) ([place])    a. Severe stenosis mid right SFA s/p atherectomy 03/25/09. b. peripheral angiography in 12/2011 which showed only about 50% diffuse right SFA stenosis.  . Pancreatitis 2003  . Renal artery stenosis ([place])    a. [place] 2011 - 40-50% left RAS.  . Tobacco use disorder    quit 11/10    Past Surgical History:  Procedure Laterality Date  . ABDOMINAL AORTAGRAM N/A 12/21/2011   Procedure: ABDOMINAL Maxcine Ham;  Surgeon: Wellington Hampshire, MD;  Location: [place] CATH LAB;  Service: Cardiovascular;  Laterality: N/A;  . CARDIAC CATHETERIZATION    . ESOPHAGOGASTRODUODENOSCOPY (EGD) WITH PROPOFOL N/A 10/05/2017   Procedure: ESOPHAGOGASTRODUODENOSCOPY (EGD) WITH PROPOFOL;  Surgeon: Milus Banister, MD;  Location: [facility];  Service: Endoscopy;  Laterality: N/A;  . EUS N/A 10/05/2017   Procedure: UPPER ENDOSCOPIC ULTRASOUND (EUS) RADIAL;  Surgeon: Milus Banister, MD;  Location: [facility] ENDOS[facility];  Service: Endoscopy;  Laterality: N/A;  . FOOT SURGERY Left   . LOWER EXTREMITY ANGIOGRAM Bilateral 05/27/2015   Procedure: Lower Extremity Angiogram;  Surgeon: Wellington Hampshire, MD;  Location: [place] CV LAB;  Service: Cardiovascular;  Laterality: Bilateral;  . PERIPHERAL VASCULAR CATHETERIZATION N/A 05/27/2015   Procedure: Abdominal Aortogram;  Surgeon: Wellington Hampshire, MD;  Location: [place] CV LAB;  Service: Cardiovascular;  Laterality: N/A;  . PERIPHERAL VASCULAR CATHETERIZATION Right 05/27/2015   Procedure: Peripheral Vascular Intervention;  Surgeon: Wellington Hampshire, MD;  Location: [place] CV LAB;  Service: Cardiovascular;  Laterality: Right;  SFA  . TOTAL KNEE ARTHROPLASTY Right 2011   . TOTAL KNEE ARTHROPLASTY Left 05/22/2014   Procedure: LEFT TOTAL KNEE ARTHROPLASTY;  Surgeon: Latanya Maudlin, MD;  Location: [facility];  Service: Orthopedics;  Laterality: Left;  . TUBAL LIGATION  1980     Current Outpatient Medications  Medication Sig Dispense Refill  . acetaminophen (TYLENOL) 500 MG tablet Take 1,000 mg by mouth every 6 (six) hours as needed for moderate pain or headache.    . ADVAIR DISKUS 250-50 MCG/DOSE AEPB INHALE 1 PUFF BY MOUTH INTO THE LUNGS EVERY 12 HOURS (Patient taking differently: Inhale 1 puff into the lungs 2 (two) times daily. ) 60 each 5  . aspirin 81 MG tablet Take 81 mg by mouth daily.      . Biotin w/ Vitamins C & E (HAIR/SKIN/NAILS PO) Take 1 tablet by mouth daily.    . bisoprolol (ZEBETA) 5 MG tablet TAKE ONE-HALF TABLET BY MOUTH EVERY DAY (Patient taking differently: Take 2.5 mg by mouth at bedtime. ) 90 tablet 3  . Blood Glucose Monitoring Suppl (BAYER CONTOUR MONITOR) w/Device KIT CHECK BLOOD SUGAR EVERY DAY 1 kit 0  . clopidogrel (PLAVIX) 75 MG tablet Take 1 tablet (75 mg total) by mouth daily. 30 tablet 11  . CONTOUR NEXT TEST test strip CHECK BLOOD SUGAR DAILY AS DIRECTED 100 strip 3  . CREON 36000 units CPEP capsule TAKE 2 CAPSULES BY MOUTH THREE TIMES DAILY BEFORE MEALS (Patient taking differently: Take 72,000 Units by mouth 3 (three) times daily as needed (when eating a meal). ) 180 capsule 0  . diclofenac sodium (VOLTAREN) 1 % GEL Apply 2 g topically 4 (four) times daily. (Patient taking differently: Apply 2 g topically 4 (four) times daily as needed (for hand pain). ) 100 g 0  . HYDROcodone-acetaminophen (NORCO/VICODIN) 5-325 MG tablet Take 1 tablet by mouth every 6 (six) hours as needed for moderate pain. Pain [facility], [place]    . isosorbide mononitrate (IMDUR) 60 MG 24 hr tablet TAKE 1 TABLET BY MOUTH DAILY (Patient taking differently: Take 60 mg by mouth at bedtime. ) 90 tablet 2  . metFORMIN (GLUCOPHAGE) 1000 MG tablet TAKE 1 TABLET BY  MOUTH TWICE DAILY WITH MEALS 180 tablet 0  . methocarbamol (ROBAXIN) 500 MG tablet TAKE 1 TABLET BY MOUTH EVERY 6 HOURS AS NEEDED FOR MUSCLE SPASMS 120 tablet 2  . Multiple Vitamin (MULTIVITAMIN) tablet Take 1 tablet by mouth daily.      . nitroGLYCERIN (NITROSTAT) 0.4 MG SL tablet Place 1 tablet under the tongue every 5 (five) minutes as needed for chest pain.    Marland Kitchen ondansetron (ZOFRAN) 4 MG tablet TAKE 1 TABLET BY MOUTH EVERY 8 HOURS AS NEEDED FOR NAUSEA OR VOMITING 30 tablet 2  . pantoprazole (PROTONIX) 40 MG tablet TAKE 1 TABLET BY MOUTH DAILY (Patient taking differently: 40 mg. ) 90 tablet 3  . pioglitazone (ACTOS) 30 MG tablet Take 1 tablet (30 mg total) by mouth daily. 90 tablet 3  . Probiotic Product (TRUBIOTICS PO) Take 1 capsule by mouth daily.    . rosuvastatin (CRESTOR) 40 MG tablet  Take 1 tablet (40 mg total) by mouth at bedtime. 90 tablet 3  . VENTOLIN HFA 108 (90 Base) MCG/ACT inhaler INHALE 2 PUFFS BY MOUTH TWICE DAILY AS NEEDED FOR WHEEZING OR SHORTNESS OF BREATH (Patient taking differently: Inhale 1 puff into the lungs 2 (two) times daily as needed for wheezing or shortness of breath. ) 18 g 3   No current facility-administered medications for this visit.    Allergies:   Gabapentin, Glucotrol [glipizide], Influenza vaccines, Latex, Nortriptyline, Tetanus toxoid, and Invokana [canagliflozin]    Social History:  The patient  reports that she quit smoking about 10 years ago. She has never used smokeless tobacco. She reports that she does not drink alcohol or use drugs.   Family History:  The patient's family history includes Breast cancer (age of onset: 51) in her sister; CVA in her mother; Emphysema in her father.    ROS:  Please see the history of present illness.   Otherwise, review of systems are positive for .   All other systems are reviewed and negative.    PHYSICAL EXAM: VS:  BP 118/71   Pulse 75   Temp (!) 96.9 F (36.1 C)   Ht _0  (1.6 m)   Wt 155 lb 3.2 oz  (70.4 kg)   SpO2 100%   BMI 27.49 kg/m  , BMI Body mass index is 27.49 kg/m. GEN: Well nourished, well developed, in no acute distress  HEENT: normal  Neck: no JVD, carotid bruits, or masses Cardiac: RRR; no murmurs, rubs, or gallops,no edema  Respiratory:  clear to auscultation bilaterally, normal work of breathing GI: soft, nontender, nondistended, + BS MS: no deformity or atrophy  Skin: warm and dry, no rash Neuro:  Strength and sensation are intact Psych: euthymic mood, full affect Vascular :  Femoral pulse is mildly diminished bilaterally.  Distal pulses are diminished.   EKG:  EKG is  ordered today. EKG showed sinus rhythm with no significant ST or T wave changes.    Recent Labs: 02/18/2019: ALT 17; BUN 18; Creat 0.90; Hemoglobin 12.5; Platelets 277; Potassium 4.3; Sodium 140    Lipid Panel    Component Value Date/Time   CHOL 142 02/18/2019 1121   TRIG 226 (H) 02/18/2019 1121   HDL 46 (L) 02/18/2019 1121   CHOLHDL 3.1 02/18/2019 1121   VLDL 35 07/16/2017 0345   LDLCALC 68 02/18/2019 1121   LDLDIRECT 50.6 01/02/2014 1003      Wt Readings from Last 3 Encounters:  03/05/19 155 lb 3.2 oz (70.4 kg)  03/01/19 156 lb 9.6 oz (71 kg)  02/18/19 154 lb (69.9 kg)       ASSESSMENT AND PLAN:  1.  PAD: Status post recent stent placement to the proximal right SFA.  She reports bilateral calf claudication equal in both sides.  Some of her symptoms seem to be neuropathic.  I requested a follow-up ABI and lower extremity arterial duplex.    2. Coronary artery disease involving native coronary arteries without angina:  Previous cardiac catheterization in 2011 showed distal LAD and small branch disease which is being managed medically.  She had worsening chest pain last year but she reports improvement in symptoms since then.  Continue medical therapy.  3. Hyperlipidemia:  Continue treatment with high-dose rosuvastatin.  Recommend a target LDL of less than 70.  Recent lipid  profile showed an LDL of 68.  4. Diabetes mellitus: Managed by primary care physician.    Disposition:   FU with me  in 6 months  Signed,  Kathlyn Sacramento, MD  03/05/2019 1:23 PM    [facility]

## 2019-03-05 NOTE — Telephone Encounter (Signed)
Requested Prescriptions   Pending Prescriptions Disp Refills  . HYDROcodone-acetaminophen (NORCO/VICODIN) 5-325 MG tablet 30 tablet 0    Sig: Take 1 tablet by mouth every 6 (six) hours as needed for moderate pain. Pain [facility], [place]    Last OV 02/18/2019   Last written 02/18/2019

## 2019-03-05 NOTE — Patient Instructions (Signed)
Medication Instructions:  No changes *If you need a refill on your cardiac medications before your next appointment, please call your pharmacy*  Lab Work: None ordered If you have labs (blood work) drawn today and your tests are completely normal, you will receive your results only by: . MyChart Message (if you have MyChart) OR . A paper copy in the mail If you have any lab test that is abnormal or we need to change your treatment, we will call you to review the results.  Testing/Procedures: Your physician has requested that you have a lower extremity arterial duplex. During this test, ultrasound is used to evaluate arterial blood flow in the legs. Allow one hour for this exam. There are no restrictions or special instructions. This will take place at [address], [address].  Your physician has requested that you have an ankle brachial index (ABI). During this test an ultrasound and blood pressure cuff are used to evaluate the arteries that supply the arms and legs with blood. Allow thirty minutes for this exam. There are no restrictions or special instructions. This will take place at [address].   Follow-Up: At [facility], you and your health needs are our priority.  As part of our continuing mission to provide you with exceptional heart care, we have created designated Provider Care Teams.  These Care Teams include your primary Cardiologist (physician) and Advanced Practice Providers (APPs -  Physician Assistants and Nurse Practitioners) who all work together to provide you with the care you need, when you need it.  Your next appointment:   6 month(s)  The format for your next appointment:   In Person  Provider:   Muhammad Arida, MD  

## 2019-03-07 ENCOUNTER — Telehealth: Payer: Self-pay | Admitting: Family Medicine

## 2019-03-07 NOTE — Telephone Encounter (Signed)
Patient called stating that the drug store told her that we needed a PA on her Hydrocodone she states that she runs out on Saturday.  CB# [phone]

## 2019-03-08 NOTE — Telephone Encounter (Signed)
PA Case: [id number], Status: Approved, Coverage Starts on: 03/08/2019 12:00:00 AM, Coverage Ends on: 06/06/2019 12:00:00 AM.  Pharmacy made aware

## 2019-03-08 NOTE — Telephone Encounter (Signed)
PA Submitted through [email or web address] and received the following:  [employer] has received your information, and the request will be reviewed. You may close this dialog, return to your dashboard, and perform other tasks.  You will receive an electronic determination in CoverMyMeds. You can see the latest determination by locating this request on your dashboard or by reopening this request. You will also receive a faxed copy of the determination. If you have any questions please contact [employer] at [phone].  If you need assistance, please chat with CoverMyMeds or call us at [phone]. 

## 2019-03-14 ENCOUNTER — Ambulatory Visit (HOSPITAL_COMMUNITY)
Admission: RE | Admit: 2019-03-14 | Discharge: 2019-03-14 | Disposition: A | Discharge status: Home / Self Care | Payer: 59 | Source: Ambulatory Visit | Attending: Internal Medicine | Admitting: Internal Medicine

## 2019-03-14 ENCOUNTER — Other Ambulatory Visit (HOSPITAL_COMMUNITY): Payer: Self-pay | Admitting: Cardiovascular Disease

## 2019-03-14 ENCOUNTER — Other Ambulatory Visit: Payer: Self-pay

## 2019-03-14 DIAGNOSIS — Z95820 Peripheral vascular angioplasty status with implants and grafts: Secondary | ICD-10-CM

## 2019-03-14 DIAGNOSIS — I739 Peripheral vascular disease, unspecified: Secondary | ICD-10-CM

## 2019-03-19 ENCOUNTER — Other Ambulatory Visit: Payer: Self-pay | Admitting: Orthopedic Surgery

## 2019-03-19 DIAGNOSIS — M25511 Pain in right shoulder: Secondary | ICD-10-CM

## 2019-03-22 ENCOUNTER — Telehealth: Payer: Self-pay | Admitting: Cardiovascular Disease

## 2019-03-22 NOTE — Telephone Encounter (Signed)
Called patient, she state she would like a sooner appointment per the last results,. Advised I would notify nurse. Patient verbalized understanding.

## 2019-03-22 NOTE — Telephone Encounter (Signed)
New message:     patient calling stating that some one called her concering some results. Please call patient back.

## 2019-03-22 NOTE — Telephone Encounter (Signed)
The patient stated that she has been having increased pain in her left leg. Her and her husband walk daily and this has been starting to become more difficult. She denies any color or temperature changes in her leg.   She asked for an appointment on 2/23 with Dr. Kirke Corin. She has been advised that if her pain becomes worse or she notices any color or temperature change then she should call us back. She has verbalized her understanding.

## 2019-03-22 NOTE — Telephone Encounter (Signed)
Called patient, LVM to call back.  Left call back number.   

## 2019-03-27 ENCOUNTER — Other Ambulatory Visit: Payer: Self-pay | Admitting: Orthopedic Surgery

## 2019-03-27 ENCOUNTER — Ambulatory Visit
Admission: RE | Admit: 2019-03-27 | Discharge: 2019-03-27 | Disposition: A | Discharge status: Home / Self Care | Payer: 59 | Source: Ambulatory Visit | Attending: Orthopedic Surgery | Admitting: Orthopedic Surgery

## 2019-03-27 DIAGNOSIS — M25511 Pain in right shoulder: Secondary | ICD-10-CM

## 2019-04-02 ENCOUNTER — Other Ambulatory Visit: Payer: Self-pay | Admitting: Family Medicine

## 2019-04-02 MED ORDER — HYDROCODONE-ACETAMINOPHEN 5-325 MG PO TABS: 1.0000 | ORAL_TABLET | Freq: Four times a day (QID) | ORAL | 0 refills | Status: DC | PRN

## 2019-04-02 NOTE — Telephone Encounter (Signed)
Patient needs refill on hydrocodone  [facility]

## 2019-04-02 NOTE — Telephone Encounter (Signed)
Patient is requesting a refill on Hydrocodone   LOV: 02/18/19  LRF:   03/05/2019

## 2019-04-09 ENCOUNTER — Encounter (INDEPENDENT_AMBULATORY_CARE_PROVIDER_SITE_OTHER): Payer: Self-pay

## 2019-04-09 ENCOUNTER — Ambulatory Visit (INDEPENDENT_AMBULATORY_CARE_PROVIDER_SITE_OTHER): Payer: 59 | Admitting: Cardiovascular Disease

## 2019-04-09 ENCOUNTER — Other Ambulatory Visit: Payer: Self-pay

## 2019-04-09 ENCOUNTER — Encounter: Payer: Self-pay | Admitting: Cardiovascular Disease

## 2019-04-09 VITALS — BP 126/78 | HR 83 | Temp 97.0°F | Ht 63.0 in | Wt 151.0 lb

## 2019-04-09 DIAGNOSIS — Z01818 Encounter for other preprocedural examination: Secondary | ICD-10-CM | POA: Diagnosis not present

## 2019-04-09 DIAGNOSIS — I739 Peripheral vascular disease, unspecified: Secondary | ICD-10-CM

## 2019-04-09 DIAGNOSIS — E785 Hyperlipidemia, unspecified: Secondary | ICD-10-CM | POA: Diagnosis not present

## 2019-04-09 DIAGNOSIS — I2510 Atherosclerotic heart disease of native coronary artery without angina pectoris: Secondary | ICD-10-CM | POA: Diagnosis not present

## 2019-04-09 NOTE — Patient Instructions (Addendum)
Medication Instructions:  No changes *If you need a refill on your cardiac medications before your next appointment, please call your pharmacy*  Lab Work: Your provider would like for you to have the following labs today: CBC and BMET  If you have labs (blood work) drawn today and your tests are completely normal, you will receive your results only by: Marland Kitchen MyChart Message (if you have MyChart) OR . A paper copy in the mail If you have any lab test that is abnormal or we need to change your treatment, we will call you to review the results.  Testing/Procedures: Your physician has requested that you have a peripheral vascular angiogram. This exam is performed at the hospital. During this exam IV contrast is used to look at arterial blood flow. Please review the information sheet given for details.   Follow-Up: At [facility], you and your health needs are our priority.  As part of our continuing mission to provide you with exceptional heart care, we have created designated Provider Care Teams.  These Care Teams include your primary Cardiologist (physician) and Advanced Practice Providers (APPs -  Physician Assistants and Nurse Practitioners) who all work together to provide you with the care you need, when you need it.  Your next appointment:   1 month(s)  The format for your next appointment:   In Person  Provider:   Lorine Bears, MD  Other Instructions    [facility] CARDIOVASCULAR DIVISION [facility] [address] [place] [address] Dept: [phone] Loc: [phone]  [name]  04/09/2019  You are scheduled for a Peripheral Angiogram on Wednesday, March 10 with Dr. Lorine Bears.  1. Please arrive at the [facility] (Main Entrance A) at [facility]: [address] [place], [place] [address] at 6:30 AM (This time is two hours before your procedure to ensure your preparation). Free valet parking service  is available.   Special note: Every effort is made to have your procedure done on time. Please understand that emergencies sometimes delay scheduled procedures.  2. Diet: Do not eat solid foods after midnight.  The patient may have clear liquids until 5am upon the day of the procedure.  3. Labs:  You will need to have the coronavirus test completed prior to your procedure. An appointment has been made at 12:10 pm on 04/20/19. This is a Drive Up Visit at the [facility] [address] Someone will direct you to the appropriate testing line. Please tell them that you are there for procedure testing. Stay in your car and someone will be with you shortly. Please make sure to have all other labs completed before this test because you will need to stay quarantined until your procedure.  4. Medication instructions in preparation for your procedure: Hold the Metformin the morning of the procedure and then 48 hours after.  Hold the Pioglitazone the morning of the procedure  On the morning of your procedure, take your Aspirin and Plavix/Clopidogrel and any morning medicines NOT listed above.  You may use sips of water.  5. Plan for one night stay--bring personal belongings. 6. Bring a current list of your medications and current insurance cards. 7. You MUST have a responsible person to drive you home. 8. Someone MUST be with you the first 24 hours after you arrive home or your discharge will be delayed. 9. Please wear clothes that are easy to get on and off and wear slip-on shoes.  Thank you for allowing [place] to care for you!   -- [facility]

## 2019-04-09 NOTE — Progress Notes (Signed)
Cardiology Office Note   Date:  04/09/2019   ID:  [name], DOB Feb 18, 1955, MRN [id number]  PCP:  Susy Frizzle, MD  Cardiologist:  Stark Klein chief complaint on file.     History of Present Illness: [name] is a 64 y.o. female who presents for a follow up visit regarding peripheral arterial disease and coronary artery disease. She has known history of peripheral arterial disease status post atherectomy of the right SFA in 2011 by Dr. Julianne Handler. She has other chronic medical conditions including CAD, DM, HTN, and hyperlipidemia. She is a former smoker.  She is s/p bilateral TKR.   She had worsening right leg claudication in April 2017. Angiography showed no significant aortoiliac disease. There was moderate right common femoral artery stenosis and severe discrete stenosis in the proximal right SFA with three-vessel runoff below the knee. There was no significant obstructive disease involving the left lower extremity. A self-expanding stent was placed to the proximal right SFA. She had previous bilateral knee replacement and chronic bilateral leg pain.   She reestablished follow-up with me recently.  She was hospitalized in August pancreatitis.  She is labeled as chronic pancreatitis due to alcohol use but she denies recent alcohol use.  During my recent evaluation, she complained of increased bilateral calf claudication and thus I repeated her vascular studies which showed mildly reduced ABI bilaterally in the 0.8 range.  Duplex showed moderate right common femoral artery stenosis and moderate SFA disease.  On the left, there was severe new stenosis in the proximal SFA. She reports that her leg claudication is worsening and she has quit exercising and walking the dog due to that.  She also used to ride her stationary bike but she stopped due to her symptoms.  Past Medical History:  Diagnosis Date  . Asthma with COPD ([place])    PATIENT DENIES  . CAD (coronary artery  disease)    a. [place] 2011: Diffuse distal and branch vessel disease - patient managed medically, no interventional options. b. [place] 03/2014 - low risk, no ischemia.  . Complication of anesthesia    slow to wake up with last surgery in 2011   . Diabetes mellitus with circulatory complication ([place])   . Dyslipidemia   . Headache    hx of migraines   . Hypertension   . Osteoarthritis    severe right knee  R TKR  . PAD (peripheral artery disease) ([place])    a. Severe stenosis mid right SFA s/p atherectomy 03/25/09. b. peripheral angiography in 12/2011 which showed only about 50% diffuse right SFA stenosis.  . Pancreatitis 2003  . Renal artery stenosis ([place])    a. [place] 2011 - 40-50% left RAS.  . Tobacco use disorder    quit 11/10    Past Surgical History:  Procedure Laterality Date  . ABDOMINAL AORTAGRAM N/A 12/21/2011   Procedure: ABDOMINAL Maxcine Ham;  Surgeon: Wellington Hampshire, MD;  Location: [facility] CATH LAB;  Service: Cardiovascular;  Laterality: N/A;  . CARDIAC CATHETERIZATION    . ESOPHAGOGASTRODUODENOSCOPY (EGD) WITH PROPOFOL N/A 10/05/2017   Procedure: ESOPHAGOGASTRODUODENOSCOPY (EGD) WITH PROPOFOL;  Surgeon: Milus Banister, MD;  Location: [facility];  Service: Endoscopy;  Laterality: N/A;  . EUS N/A 10/05/2017   Procedure: UPPER ENDOSCOPIC ULTRASOUND (EUS) RADIAL;  Surgeon: Milus Banister, MD;  Location: [facility];  Service: Endoscopy;  Laterality: N/A;  . FOOT SURGERY Left   . LOWER EXTREMITY ANGIOGRAM Bilateral 05/27/2015   Procedure: Lower Extremity Angiogram;  Surgeon: Wellington Hampshire, MD;  Location: [facility] CV LAB;  Service: Cardiovascular;  Laterality: Bilateral;  . PERIPHERAL VASCULAR CATHETERIZATION N/A 05/27/2015   Procedure: Abdominal Aortogram;  Surgeon: Wellington Hampshire, MD;  Location: [place] [facility] CV LAB;  Service: Cardiovascular;  Laterality: N/A;  . PERIPHERAL VASCULAR CATHETERIZATION Right 05/27/2015   Procedure: Peripheral Vascular Intervention;  Surgeon: Wellington Hampshire, MD;  Location: [facility] CV LAB;  Service: Cardiovascular;  Laterality: Right;  SFA  . TOTAL KNEE ARTHROPLASTY Right 2011  . TOTAL KNEE ARTHROPLASTY Left 05/22/2014   Procedure: LEFT TOTAL KNEE ARTHROPLASTY;  Surgeon: Latanya Maudlin, MD;  [facility]: [facility];  Service: Orthopedics;  Laterality: Left;  . TUBAL LIGATION  1980     Current Outpatient Medications  Medication Sig Dispense Refill  . acetaminophen (TYLENOL) 500 MG tablet Take 1,000 mg by mouth every 6 (six) hours as needed for moderate pain or headache.    . ADVAIR DISKUS 250-50 MCG/DOSE AEPB INHALE 1 PUFF BY MOUTH INTO THE LUNGS EVERY 12 HOURS (Patient taking differently: Inhale 1 puff into the lungs 2 (two) times daily. ) 60 each 5  . aspirin 81 MG tablet Take 81 mg by mouth daily.      . Biotin w/ Vitamins C & E (HAIR/SKIN/NAILS PO) Take 1 tablet by mouth daily.    . bisoprolol (ZEBETA) 5 MG tablet TAKE ONE-HALF TABLET BY MOUTH EVERY DAY (Patient taking differently: Take 2.5 mg by mouth at bedtime. ) 90 tablet 3  . Blood Glucose Monitoring Suppl (BAYER CONTOUR MONITOR) w/Device KIT CHECK BLOOD SUGAR EVERY DAY 1 kit 0  . clopidogrel (PLAVIX) 75 MG tablet Take 1 tablet (75 mg total) by mouth daily. 30 tablet 11  . CONTOUR NEXT TEST test strip CHECK BLOOD SUGAR DAILY AS DIRECTED 100 strip 3  . CREON 36000 units CPEP capsule TAKE 2 CAPSULES BY MOUTH THREE TIMES DAILY BEFORE MEALS (Patient taking differently: Take 72,000 Units by mouth 3 (three) times daily as needed (when eating a meal). ) 180 capsule 0  . diclofenac sodium (VOLTAREN) 1 % GEL Apply 2 g topically 4 (four) times daily. (Patient taking differently: Apply 2 g topically 4 (four) times daily as needed (for hand pain). ) 100 g 0  . HYDROcodone-acetaminophen (NORCO/VICODIN) 5-325 MG tablet Take 1 tablet by mouth every 6 (six) hours as needed for moderate pain. Pain Management, [place] 130 tablet 0  . isosorbide mononitrate (IMDUR) 60 MG 24 hr tablet TAKE 1 TABLET BY  MOUTH DAILY (Patient taking differently: Take 60 mg by mouth at bedtime. ) 90 tablet 2  . metFORMIN (GLUCOPHAGE) 1000 MG tablet TAKE 1 TABLET BY MOUTH TWICE DAILY WITH MEALS 180 tablet 0  . methocarbamol (ROBAXIN) 500 MG tablet TAKE 1 TABLET BY MOUTH EVERY 6 HOURS AS NEEDED FOR MUSCLE SPASMS 120 tablet 2  . Multiple Vitamin (MULTIVITAMIN) tablet Take 1 tablet by mouth daily.      . nitroGLYCERIN (NITROSTAT) 0.4 MG SL tablet Place 1 tablet under the tongue every 5 (five) minutes as needed for chest pain.    Marland Kitchen ondansetron (ZOFRAN) 4 MG tablet TAKE 1 TABLET BY MOUTH EVERY 8 HOURS AS NEEDED FOR NAUSEA OR VOMITING 30 tablet 2  . pantoprazole (PROTONIX) 40 MG tablet TAKE 1 TABLET BY MOUTH DAILY (Patient taking differently: 40 mg. ) 90 tablet 3  . pioglitazone (ACTOS) 30 MG tablet Take 1 tablet (30 mg total) by mouth daily. 90 tablet 3  . predniSONE (DELTASONE) 5 MG tablet     .  Probiotic Product (TRUBIOTICS PO) Take 1 capsule by mouth daily.    . rosuvastatin (CRESTOR) 40 MG tablet Take 1 tablet (40 mg total) by mouth at bedtime. 90 tablet 3  . VENTOLIN HFA 108 (90 Base) MCG/ACT inhaler INHALE 2 PUFFS BY MOUTH TWICE DAILY AS NEEDED FOR WHEEZING OR SHORTNESS OF BREATH (Patient taking differently: Inhale 1 puff into the lungs 2 (two) times daily as needed for wheezing or shortness of breath. ) 18 g 3   No current facility-administered medications for this visit.    Allergies:   Gabapentin, Glucotrol [glipizide], Influenza vaccines, Latex, Nortriptyline, Tetanus toxoid, and Invokana [canagliflozin]    Social History:  The patient  reports that she quit smoking about 10 years ago. She has never used smokeless tobacco. She reports that she does not drink alcohol or use drugs.   Family History:  The patient's family history includes Breast cancer (age of onset: 17) in her sister; CVA in her mother; Emphysema in her father.    ROS:  Please see the history of present illness.   Otherwise, review of systems  are positive for .   All other systems are reviewed and negative.    PHYSICAL EXAM: VS:  BP 126/78   Pulse 83   Temp (!) 97 F (36.1 C)   Ht _0  (1.6 m)   Wt 211 lb (95.7 kg)   SpO2 97%   BMI 37.38 kg/m  , BMI Body mass index is 37.38 kg/m. GEN: Well nourished, well developed, in no acute distress  HEENT: normal  Neck: no JVD, carotid bruits, or masses Cardiac: RRR; no murmurs, rubs, or gallops,no edema  Respiratory:  clear to auscultation bilaterally, normal work of breathing GI: soft, nontender, nondistended, + BS MS: no deformity or atrophy  Skin: warm and dry, no rash Neuro:  Strength and sensation are intact Psych: euthymic mood, full affect Vascular :  Femoral pulse is mildly diminished bilaterally.  Distal pulses are diminished but stronger on the right side   EKG:  EKG is not ordered today.     Recent Labs: 02/18/2019: ALT 17; BUN 18; Creat 0.90; Hemoglobin 12.5; Platelets 277; Potassium 4.3; Sodium 140    Lipid Panel    Component Value Date/Time   CHOL 142 02/18/2019 1121   TRIG 226 (H) 02/18/2019 1121   HDL 46 (L) 02/18/2019 1121   CHOLHDL 3.1 02/18/2019 1121   VLDL 35 07/16/2017 0345   LDLCALC 68 02/18/2019 1121   LDLDIRECT 50.6 01/02/2014 1003      Wt Readings from Last 3 Encounters:  04/09/19 211 lb (95.7 kg)  03/05/19 155 lb 3.2 oz (70.4 kg)  03/01/19 156 lb 9.6 oz (71 kg)       ASSESSMENT AND PLAN:  1.  PAD: Status post recent stent placement to the proximal right SFA.  Now with severe lifestyle limiting left calf claudication and some right calf claudication.  Duplex new severe stenosis in the left proximal SFA.  I discussed with her the options of management of claudication including starting an exercise program.  She reports that she already tried and she has not been able to.  Even short distance has been a struggle.  Given her symptoms and lifestyle limitations, I recommend proceeding with abdominal aortogram with lower extremity runoff  and possible endovascular intervention.  I discussed the procedure in details as well as risks and benefits.  She is already on dual antiplatelet therapy.  Planned access is via the right common femoral artery.  2. Coronary artery disease involving native coronary arteries without angina:  Previous cardiac catheterization in 2011 showed distal LAD and small branch disease which is being managed medically.  She had worsening chest pain last year but she reports improvement in symptoms since then.  Continue medical therapy.  3. Hyperlipidemia:  Continue treatment with high-dose rosuvastatin.  Recommend a target LDL of less than 70.  Recent lipid profile showed an LDL of 68.  4. Diabetes mellitus: Managed by primary care physician.    Disposition:   FU with me in 1 months  Signed,  Kathlyn Sacramento, MD  04/09/2019 10:21 AM    [place]

## 2019-04-10 LAB — CBC
Hematocrit: 40.7 % (ref 34.0–46.6)
Hemoglobin: 13 g/dL (ref 11.1–15.9)
MCH: 28.3 pg (ref 26.6–33.0)
MCHC: 31.9 g/dL (ref 31.5–35.7)
MCV: 89 fL (ref 79–97)
Platelets: 282 10*3/uL (ref 150–450)
RBC: 4.59 x10E6/uL (ref 3.77–5.28)
RDW: 13.1 % (ref 11.7–15.4)
WBC: 5.2 10*3/uL (ref 3.4–10.8)

## 2019-04-10 LAB — BASIC METABOLIC PANEL
BUN/Creatinine Ratio: 18 (ref 12–28)
BUN: 23 mg/dL (ref 8–27)
CO2: 24 mmol/L (ref 20–29)
Calcium: 10.1 mg/dL (ref 8.7–10.3)
Chloride: 99 mmol/L (ref 96–106)
Creatinine, Ser: 1.29 mg/dL — ABNORMAL HIGH (ref 0.57–1.00)
GFR calc Af Amer: 51 mL/min/{1.73_m2} — ABNORMAL LOW (ref >59)
GFR calc non Af Amer: 44 mL/min/{1.73_m2} — ABNORMAL LOW (ref >59)
Glucose: 175 mg/dL — ABNORMAL HIGH (ref 65–99)
Potassium: 4.6 mmol/L (ref 3.5–5.2)
Sodium: 138 mmol/L (ref 134–144)

## 2019-04-20 ENCOUNTER — Other Ambulatory Visit (HOSPITAL_COMMUNITY)
Admission: RE | Admit: 2019-04-20 | Discharge: 2019-04-20 | Disposition: A | Discharge status: Home / Self Care | Payer: 59 | Source: Ambulatory Visit | Attending: Cardiovascular Disease | Admitting: Cardiovascular Disease

## 2019-04-20 DIAGNOSIS — Z20822 Contact with and (suspected) exposure to covid-19: Secondary | ICD-10-CM | POA: Insufficient documentation

## 2019-04-20 DIAGNOSIS — Z01812 Encounter for preprocedural laboratory examination: Secondary | ICD-10-CM | POA: Insufficient documentation

## 2019-04-20 LAB — SARS CORONAVIRUS 2 (TAT 6-24 HRS): SARS Coronavirus 2: NEGATIVE

## 2019-04-23 ENCOUNTER — Telehealth: Payer: Self-pay | Admitting: *Deleted

## 2019-04-23 NOTE — Telephone Encounter (Signed)
Pt contacted pre-abdominal aortogram  scheduled at [facility] for: Wednesday April 24, 2019 8:30 AM Verified arrival time and place: [facility] Main Entrance [facility]) at: 6:30 AM  Per Dr Kirke Corin 04/10/19-Mildly elevated creatinine. Please advise her to increase fluid intake especially to 3 days before the angiogram. Pt states she increased her fluid intake starting yesterday and will continue to increase fluid intake today prior to procedure tomorrow.   No solid food after midnight prior to cath, clear liquids until 5 AM day of procedure. Contrast allergy: no  Hold: Metformin-day of procedure and 48 hours post procedure Actos-AM of procedure    Except hold medications AM meds can be  taken pre-cath with sip of water including: ASA 81 mg Plavix 75 mg   Confirmed patient has responsible adult to drive home post procedure and observe 24 hours after arriving home: yes  Currently, due to Covid-19 pandemic, only one person will be allowed with patient. Must be the same person for patient's entire stay and will be required to wear a mask. They will be asked to wait in the waiting room for the duration of the patient's stay.  Patients are required to wear a mask when they enter the hospital.      COVID-19 Pre-Screening Questions:  . In the past 7 to 10 days have you had a cough,  shortness of breath, headache, congestion, fever (100 or greater) body aches, chills, sore throat, or sudden loss of taste or sense of smell? Cough-not new, from asthma . Have you been around anyone with known Covid 19 in the past 7-10 days? no . Have you been around anyone who is awaiting Covid 19 test results in the past 7 to 10 days? no . Have you been around anyone who has been exposed to Covid 19, or has mentioned symptoms of Covid 19 within the past 7 to 10 days? no   I reviewed procedure/mask/visitor instructions, COVID-19 screening questions with patient, she verbalized understanding, thanked  me for call.

## 2019-04-24 ENCOUNTER — Encounter (HOSPITAL_COMMUNITY): Admission: RE | Payer: Self-pay | Source: Home / Self Care

## 2019-04-24 ENCOUNTER — Ambulatory Visit (HOSPITAL_COMMUNITY): Admission: RE | Admit: 2019-04-24 | Payer: 59 | Source: Home / Self Care | Admitting: Cardiovascular Disease

## 2019-04-24 ENCOUNTER — Telehealth: Payer: Self-pay | Admitting: Cardiology

## 2019-04-24 SURGERY — ABDOMINAL AORTOGRAM W/LOWER EXTREMITY
Anesthesia: LOCAL

## 2019-04-24 NOTE — Telephone Encounter (Signed)
Please reschedule her procedure to when she is feeling better.

## 2019-04-24 NOTE — Telephone Encounter (Signed)
Ms. Chiasson called to inform us that she has an episode of acute on chronic pancreatitis that started yesterday evening. She is feeling poorly with severe abdominal pain, vomiting with inability to keep even sips of water down, and abdominal distension. She does not think she would be able to tolerate a vascular procedure. I let her know that Dr. Jari Sportsman team will contact her about rescheduling.   Cynda Acres, MD

## 2019-04-29 ENCOUNTER — Other Ambulatory Visit: Payer: Self-pay | Admitting: Family Medicine

## 2019-04-29 ENCOUNTER — Telehealth: Payer: Self-pay | Admitting: Cardiovascular Disease

## 2019-04-29 DIAGNOSIS — Z01818 Encounter for other preprocedural examination: Secondary | ICD-10-CM

## 2019-04-29 DIAGNOSIS — I739 Peripheral vascular disease, unspecified: Secondary | ICD-10-CM

## 2019-04-29 MED ORDER — HYDROCODONE-ACETAMINOPHEN 5-325 MG PO TABS: 1.0000 | ORAL_TABLET | Freq: Four times a day (QID) | ORAL | 0 refills | Status: DC | PRN

## 2019-04-29 NOTE — Telephone Encounter (Signed)
Patient needs refill on hydrocodone  [facility]  

## 2019-04-29 NOTE — Telephone Encounter (Signed)
Returned the call to the patient. She will need to reschedule the pv angiogram that she needed to cancel previously.  She would like to have it done 3/24. She has been advised that we will call back once the details have been worked out.

## 2019-04-29 NOTE — Telephone Encounter (Signed)
Patient is requesting a refill on Hydrocodone   LOV: 02/18/19 With Dr. Jeanice Lim  LRF:   04/02/2019

## 2019-04-29 NOTE — Telephone Encounter (Signed)
Patient calling to set up her pre-abdominal aortogram .

## 2019-05-01 ENCOUNTER — Telehealth: Payer: Self-pay | Admitting: Cardiovascular Disease

## 2019-05-01 NOTE — Telephone Encounter (Signed)
Duplicate enconter

## 2019-05-01 NOTE — Telephone Encounter (Signed)
Follow Up:   Wants to know if there a date she can her have her procedure please.

## 2019-05-01 NOTE — Telephone Encounter (Signed)
Returned call to patient she stated she is ready to have procedure rescheduled.Advised Dr.Arida's RN is out of office today.I will send message to her.

## 2019-05-02 NOTE — Addendum Note (Signed)
Addended by: Sandi Mariscal on: 05/02/2019 02:40 PM   Modules accepted: Orders

## 2019-05-02 NOTE — Telephone Encounter (Signed)
The patient has been called and notified of the procedure and instructions below.     [facility] CARDIOVASCULAR DIVISION [facility] ST OFFICE [address] [place] [address] Dept: [phone] Loc: [phone]  [name]  05/02/2019  You are scheduled for a Peripheral Angiogram on Wednesday, March 24 with Dr. Lorine Bears.  1. Please arrive at the [facility] (Main Entrance A) at [facility]: [address] [place], [place] [address] at 6:30 am (This time is two hours before your procedure to ensure your preparation). Free valet parking service is available.   Special note: Every effort is made to have your procedure done on time. Please understand that emergencies sometimes delay scheduled procedures.  2. Diet: Do not eat solid foods after midnight.  The patient may have clear liquids until 5am upon the day of the procedure.  3. Labs: You will need to have blood drawn on Friday, March 19 at [facility] [address], [place]  Open: 8am - 5pm (Lunch 12:30 - 1:30)   Phone: [phone]. You do not need to be fasting.  You will need to have the coronavirus test completed prior to your procedure. An appointment has been made at 10:35 on 05/04/19. This is a Drive Up Visit at the [facility] [address]. Someone will direct you to the appropriate testing line. Please tell them that you are there for procedure testing. Stay in your car and someone will be with you shortly. Please make sure to have all other labs completed before this test because you will need to stay quarantined until your procedure.   4. Medication instructions in preparation for your procedure:  Do not take Diabetes Med Glucophage (Metformin) on the day of the procedure and HOLD 48 HOURS AFTER THE PROCEDURE.  Hold all other diabetic medication the morning of the procedure.  On the morning of your procedure, take your  Aspirin and Plavix/Clopidogrel and any morning medicines NOT listed above.  You may use sips of water.  5. Plan for one night stay--bring personal belongings. 6. Bring a current list of your medications and current insurance cards. 7. You MUST have a responsible person to drive you home. 8. Someone MUST be with you the first 24 hours after you arrive home or your discharge will be delayed. 9. Please wear clothes that are easy to get on and off and wear slip-on shoes.  Thank you for allowing [place] to care for you!   -- [place] [facility]

## 2019-05-04 ENCOUNTER — Other Ambulatory Visit (HOSPITAL_COMMUNITY)
Admission: RE | Admit: 2019-05-04 | Discharge: 2019-05-04 | Disposition: A | Discharge status: Home / Self Care | Payer: 59 | Source: Ambulatory Visit | Attending: Cardiovascular Disease | Admitting: Cardiovascular Disease

## 2019-05-04 DIAGNOSIS — Z01812 Encounter for preprocedural laboratory examination: Secondary | ICD-10-CM | POA: Insufficient documentation

## 2019-05-04 DIAGNOSIS — Z20822 Contact with and (suspected) exposure to covid-19: Secondary | ICD-10-CM | POA: Insufficient documentation

## 2019-05-04 LAB — CBC
Hematocrit: 40.1 % (ref 34.0–46.6)
Hemoglobin: 12.5 g/dL (ref 11.1–15.9)
MCH: 28.4 pg (ref 26.6–33.0)
MCHC: 31.2 g/dL — ABNORMAL LOW (ref 31.5–35.7)
MCV: 91 fL (ref 79–97)
Platelets: 317 10*3/uL (ref 150–450)
RBC: 4.4 x10E6/uL (ref 3.77–5.28)
RDW: 13.3 % (ref 11.7–15.4)
WBC: 5.7 10*3/uL (ref 3.4–10.8)

## 2019-05-04 LAB — BASIC METABOLIC PANEL
BUN/Creatinine Ratio: 20 (ref 12–28)
BUN: 21 mg/dL (ref 8–27)
CO2: 22 mmol/L (ref 20–29)
Calcium: 10.1 mg/dL (ref 8.7–10.3)
Chloride: 99 mmol/L (ref 96–106)
Creatinine, Ser: 1.03 mg/dL — ABNORMAL HIGH (ref 0.57–1.00)
GFR calc Af Amer: 67 mL/min/{1.73_m2} (ref >59)
GFR calc non Af Amer: 58 mL/min/{1.73_m2} — ABNORMAL LOW (ref >59)
Glucose: 149 mg/dL — ABNORMAL HIGH (ref 65–99)
Potassium: 4.7 mmol/L (ref 3.5–5.2)
Sodium: 137 mmol/L (ref 134–144)

## 2019-05-04 LAB — SARS CORONAVIRUS 2 (TAT 6-24 HRS): SARS Coronavirus 2: NEGATIVE

## 2019-05-07 ENCOUNTER — Telehealth: Payer: Self-pay | Admitting: *Deleted

## 2019-05-07 ENCOUNTER — Telehealth (INDEPENDENT_AMBULATORY_CARE_PROVIDER_SITE_OTHER): Payer: 59 | Admitting: Cardiovascular Disease

## 2019-05-07 ENCOUNTER — Encounter: Payer: Self-pay | Admitting: Cardiovascular Disease

## 2019-05-07 VITALS — Ht 63.0 in | Wt 151.0 lb

## 2019-05-07 DIAGNOSIS — E1151 Type 2 diabetes mellitus with diabetic peripheral angiopathy without gangrene: Secondary | ICD-10-CM | POA: Diagnosis not present

## 2019-05-07 DIAGNOSIS — I2510 Atherosclerotic heart disease of native coronary artery without angina pectoris: Secondary | ICD-10-CM | POA: Diagnosis not present

## 2019-05-07 DIAGNOSIS — E785 Hyperlipidemia, unspecified: Secondary | ICD-10-CM

## 2019-05-07 DIAGNOSIS — I739 Peripheral vascular disease, unspecified: Secondary | ICD-10-CM

## 2019-05-07 NOTE — Telephone Encounter (Signed)
  Patient Consent for Virtual Visit         [name] has provided verbal consent on 05/07/2019 for a virtual visit (video or telephone).   CONSENT FOR VIRTUAL VISIT FOR:  [name]  By participating in this virtual visit I agree to the following:  I hereby voluntarily request, consent and authorize [facility] and its employed or contracted physicians, physician assistants, nurse practitioners or other licensed health care professionals (the Practitioner), to provide me with telemedicine health care services (the "Services") as deemed necessary by the treating Practitioner. I acknowledge and consent to receive the Services by the Practitioner via telemedicine. I understand that the telemedicine visit will involve communicating with the Practitioner through live audiovisual communication technology and the disclosure of certain medical information by electronic transmission. I acknowledge that I have been given the opportunity to request an in-person assessment or other available alternative prior to the telemedicine visit and am voluntarily participating in the telemedicine visit.  I understand that I have the right to withhold or withdraw my consent to the use of telemedicine in the course of my care at any time, without affecting my right to future care or treatment, and that the Practitioner or I may terminate the telemedicine visit at any time. I understand that I have the right to inspect all information obtained and/or recorded in the course of the telemedicine visit and may receive copies of available information for a reasonable fee.  I understand that some of the potential risks of receiving the Services via telemedicine include:  Marland Kitchen Delay or interruption in medical evaluation due to technological equipment failure or disruption; . Information transmitted may not be sufficient (e.g. poor resolution of images) to allow for appropriate medical decision making by the Practitioner;  and/or  . In rare instances, security protocols could fail, causing a breach of personal health information.  Furthermore, I acknowledge that it is my responsibility to provide information about my medical history, conditions and care that is complete and accurate to the best of my ability. I acknowledge that Practitioner's advice, recommendations, and/or decision may be based on factors not within their control, such as incomplete or inaccurate data provided by me or distortions of diagnostic images or specimens that may result from electronic transmissions. I understand that the practice of medicine is not an exact science and that Practitioner makes no warranties or guarantees regarding treatment outcomes. I acknowledge that a copy of this consent can be made available to me via my patient portal [facility] MyChart), or I can request a printed copy by calling the office of [facility].    I understand that my insurance will be billed for this visit.   I have read or had this consent read to me. . I understand the contents of this consent, which adequately explains the benefits and risks of the Services being provided via telemedicine.  . I have been provided ample opportunity to ask questions regarding this consent and the Services and have had my questions answered to my satisfaction. . I give my informed consent for the services to be provided through the use of telemedicine in my medical care

## 2019-05-07 NOTE — Patient Instructions (Signed)
Medication Instructions:  No changes *If you need a refill on your cardiac medications before your next appointment, please call your pharmacy*   Lab Work: None ordered If you have labs (blood work) drawn today and your tests are completely normal, you will receive your results only by: Marland Kitchen MyChart Message (if you have MyChart) OR . A paper copy in the mail If you have any lab test that is abnormal or we need to change your treatment, we will call you to review the results.   Testing/Procedures: PV angiogram 05/08/19. Instructions have been provided previously.   Follow-Up: At [facility], you and your health needs are our priority.  As part of our continuing mission to provide you with exceptional heart care, we have created designated Provider Care Teams.  These Care Teams include your primary Cardiologist (physician) and Advanced Practice Providers (APPs -  Physician Assistants and Nurse Practitioners) who all work together to provide you with the care you need, when you need it.  We recommend signing up for the patient portal called "MyChart".  Sign up information is provided on this After Visit Summary.  MyChart is used to connect with patients for Virtual Visits (Telemedicine).  Patients are able to view lab/test results, encounter notes, upcoming appointments, etc.  Non-urgent messages can be sent to your provider as well.   To learn more about what you can do with MyChart, go to [email or web address].    Your next appointment:   1 month(s)  The format for your next appointment:   In Person  Provider:   Lorine Bears, MD

## 2019-05-07 NOTE — Telephone Encounter (Signed)
Pt contacted pre-abdominal aortogram  scheduled at [facility] for: Wednesday May 08, 2019 8:30 AM Verified arrival time and place: [facility] Main Entrance [facility]) at: 6:30 AM   No solid food after midnight prior to cath, clear liquids until 5 AM day of procedure. Contrast allergy: no  Hold: Metformin-day of procedure and 48 hours post procedure. Actos-AM of procedure  Except hold medications AM meds can be  taken pre-cath with sip of water including: ASA 81 mg Plavix 75 mg  Confirmed patient has responsible adult to drive home post procedure and observe 24 hours after arriving home: yes  Currently, due to Covid-19 pandemic, only one person will be allowed with patient. Must be the same person for patient's entire stay and will be required to wear a mask. They will be asked to wait in the waiting room for the duration of the patient's stay.  Patients are required to wear a mask when they enter the hospital.     COVID-19 Pre-Screening Questions:  . In the past 7 to 10 days have you had a cough,  shortness of breath, headache, congestion, fever (100 or greater) body aches, chills, sore throat, or sudden loss of taste or sense of smell? no . Have you been around anyone with known Covid 19 in the past 7-10 days? no . Have you been around anyone who is awaiting Covid 19 test results in the past 7 to 10 days? . Have you been around anyone who has been exposed to Covid 19, or has mentioned symptoms of Covid 19 within the past 7 to 10 days?  I reviewed procedure/mask/visitor instructions, COVID-19 screening questions with patient, she verbalized understanding, thanked me for call.

## 2019-05-07 NOTE — H&P (View-Only) (Signed)
Virtual Visit via Telephone Note   This visit type was conducted due to national recommendations for restrictions regarding the COVID-19 Pandemic (e.g. social distancing) in an effort to limit this patient's exposure and mitigate transmission in our community.  Due to her co-morbid illnesses, this patient is at least at moderate risk for complications without adequate follow up.  This format is felt to be most appropriate for this patient at this time.  The patient did not have access to video technology/had technical difficulties with video requiring transitioning to audio format only (telephone).  All issues noted in this document were discussed and addressed.  No physical exam could be performed with this format.  Please refer to the patient's chart for her  consent to telehealth for [facility].   The patient was identified using 2 identifiers.  Date:  05/07/2019   ID:  [name], DOB 06-17-1955, MRN [id number]  Patient Location: Home Provider Location: Office  PCP:  Susy Frizzle, MD  Cardiologist:  Dr. Fletcher Anon Electrophysiologist:  None   Evaluation Performed:  Follow-Up Visit  Chief Complaint:  bilateral leg claudication  History of Present Illness:    [name] is a 64 y.o. female who was reached via phone for a follow-up visit regarding peripheral arterial disease and coronary artery disease. She has known history of peripheral arterial disease status post atherectomy of the right SFA in 2011 by Dr. Julianne Handler. She has other chronic medical conditions including CAD, DM, HTN, and hyperlipidemia. She is a former smoker.  She is s/p bilateral TKR.   She had worsening right leg claudication in April 2017. Angiography showed no significant aortoiliac disease. There was moderate right common femoral artery stenosis and severe discrete stenosis in the proximal right SFA with three-vessel runoff below the knee. There was no significant obstructive disease involving the left  lower extremity. A self-expanding stent was placed to the proximal right SFA. She had previous bilateral knee replacement and chronic bilateral leg pain.   She reestablished follow-up with me recently.  She was hospitalized in August pancreatitis.  She is labeled as chronic pancreatitis due to alcohol use but she denies recent alcohol use.  During my recent evaluation, she complained of increased bilateral calf claudication and thus I repeated her vascular studies which showed mildly reduced ABI bilaterally in the 0.8 range.  Duplex showed moderate right common femoral artery stenosis and moderate SFA disease.  On the left, there was severe new stenosis in the proximal SFA. She reports that her leg claudication is worsening and she has quit exercising and walking the dog due to that.  She also used to ride her stationary bike but she stopped due to her symptoms. Due to significant lifestyle limiting claudication, I scheduled her for an angiogram but she had to cancel due to a flareup of pancreatitis.  She did not require hospitalization.  She feels better now and ready to be scheduled the procedure for tomorrow.    The patient does not have symptoms concerning for COVID-19 infection (fever, chills, cough, or new shortness of breath).    Past Medical History:  Diagnosis Date  . Asthma with COPD ([place])    PATIENT DENIES  . CAD (coronary artery disease)    a. [place] 2011: Diffuse distal and branch vessel disease - patient managed medically, no interventional options. b. Nuc 03/2014 - low risk, no ischemia.  . Complication of anesthesia    slow to wake up with last surgery in 2011   .  Diabetes mellitus with circulatory complication (HCC)   . Dyslipidemia   . Headache    hx of migraines   . Hypertension   . Osteoarthritis    severe right knee  R TKR  . PAD (peripheral artery disease) (HCC)    a. Severe stenosis mid right SFA s/p atherectomy 03/25/09. b. peripheral angiography in 12/2011 which  showed only about 50% diffuse right SFA stenosis.  . Pancreatitis 2003  . Renal artery stenosis (HCC)    a. LHC 2011 - 40-50% left RAS.  . Tobacco use disorder    quit 11/10   Past Surgical History:  Procedure Laterality Date  . ABDOMINAL AORTAGRAM N/A 12/21/2011   Procedure: ABDOMINAL Ronny Flurry;  Surgeon: Iran Ouch, MD;  Location: [facility];  Service: Cardiovascular;  Laterality: N/A;  . CARDIAC CATHETERIZATION    . ESOPHAGOGASTRODUODENOSCOPY (EGD) WITH PROPOFOL N/A 10/05/2017   Procedure: ESOPHAGOGASTRODUODENOSCOPY (EGD) WITH PROPOFOL;  Surgeon: Rachael Fee, MD;  Location: [facility];  Service: Endoscopy;  Laterality: N/A;  . EUS N/A 10/05/2017   Procedure: UPPER ENDOSCOPIC ULTRASOUND (EUS) RADIAL;  Surgeon: Rachael Fee, MD;  Location: [facility];  Service: Endoscopy;  Laterality: N/A;  . FOOT SURGERY Left   . LOWER EXTREMITY ANGIOGRAM Bilateral 05/27/2015   Procedure: Lower Extremity Angiogram;  Surgeon: Iran Ouch, MD;  Location: [facility] CV [facility];  Service: Cardiovascular;  Laterality: Bilateral;  . PERIPHERAL VASCULAR CATHETERIZATION N/A 05/27/2015   Procedure: Abdominal Aortogram;  Surgeon: Iran Ouch, MD;  Location: [facility];  Service: Cardiovascular;  Laterality: N/A;  . PERIPHERAL VASCULAR CATHETERIZATION Right 05/27/2015   Procedure: Peripheral Vascular Intervention;  Surgeon: Iran Ouch, MD;  [facility]: [facility];  Service: Cardiovascular;  Laterality: Right;  SFA  . TOTAL KNEE ARTHROPLASTY Right 2011  . TOTAL KNEE ARTHROPLASTY Left 05/22/2014   Procedure: LEFT TOTAL KNEE ARTHROPLASTY;  Surgeon: Ranee Gosselin, MD;  Location: [facility];  Service: Orthopedics;  Laterality: Left;  . TUBAL LIGATION  1980     No outpatient medications have been marked as taking for the 05/07/19 encounter (Telemedicine) with Iran Ouch, MD.     Allergies:   Gabapentin, Glucotrol [glipizide], Influenza vaccines, Latex, Nortriptyline, Tetanus  toxoid, and Invokana [canagliflozin]   Social History   Tobacco Use  . Smoking status: Former Smoker    Quit date: 12/13/2008    Years since quitting: 10.4  . Smokeless tobacco: Never Used  Substance Use Topics  . Alcohol use: No    Comment: Quit drinking around year 2000  . Drug use: No    Comment: Patient quit smoking around 2011     Family Hx: The patient's family history includes Breast cancer (age of onset: 55) in her sister; CVA in her mother; Emphysema in her father.  ROS:   Please see the history of present illness.     All other systems reviewed and are negative.   Prior CV studies:   The following studies were reviewed today:    Labs/Other Tests and Data Reviewed:    EKG:  No ECG reviewed.  Recent Labs: 02/18/2019: ALT 17 05/03/2019: BUN 21; Creatinine, Ser 1.03; Hemoglobin 12.5; Platelets 317; Potassium 4.7; Sodium 137   Recent Lipid Panel Lab Results  Component Value Date/Time   CHOL 142 02/18/2019 11:21 AM   TRIG 226 (H) 02/18/2019 11:21 AM   HDL 46 (L) 02/18/2019 11:21 AM   CHOLHDL 3.1 02/18/2019 11:21 AM   LDLCALC 68 02/18/2019 11:21 AM  LDLDIRECT 50.6 01/02/2014 10:03 AM    Wt Readings from Last 3 Encounters:  05/07/19 151 lb (68.5 kg)  04/09/19 151 lb (68.5 kg)  03/05/19 155 lb 3.2 oz (70.4 kg)     Objective:    Vital Signs:  Ht 5' 3" (1.6 m)   Wt 151 lb (68.5 kg)   BMI 26.75 kg/m    VITAL SIGNS:  reviewed  ASSESSMENT & PLAN:    1.  PAD: Status post recent stent placement to the proximal right SFA.  Now with severe lifestyle limiting left calf claudication and some right calf claudication.  Duplex new severe stenosis in the left proximal SFA.  Reschedule the angiogram for tomorrow given improvement in GI symptoms. I discussed the procedure in details as well as risks and benefits.  She is already on dual antiplatelet therapy.  Planned access is via the right common femoral artery.  2. Coronary artery disease involving native coronary  arteries without angina:  Previous cardiac catheterization in 2011 showed distal LAD and small branch disease which is being managed medically.  She had worsening chest pain last year but she reports improvement in symptoms since then.  Continue medical therapy.  3. Hyperlipidemia:  Continue treatment with high-dose rosuvastatin.  Recommend a target LDL of less than 70.  Recent lipid profile showed an LDL of 68.  4. Diabetes mellitus: Managed by primary care physician.  COVID-19 Education: The signs and symptoms of COVID-19 were discussed with the patient and how to seek care for testing (follow up with PCP or arrange E-visit).  The importance of social distancing was discussed today.  Time:   Today, I have spent 8 minutes with the patient with telehealth technology discussing the above problems.     Medication Adjustments/Labs and Tests Ordered: Current medicines are reviewed at length with the patient today.  Concerns regarding medicines are outlined above.   Tests Ordered: No orders of the defined types were placed in this encounter.   Medication Changes: No orders of the defined types were placed in this encounter.   Follow Up:  In Person in 1 month(s)  Signed, Yaacov Koziol, MD  05/07/2019 10:25 AM    [facility]  

## 2019-05-07 NOTE — Progress Notes (Signed)
Virtual Visit via Telephone Note   This visit type was conducted due to national recommendations for restrictions regarding the COVID-19 Pandemic (e.g. social distancing) in an effort to limit this patient's exposure and mitigate transmission in our community.  Due to her co-morbid illnesses, this patient is at least at moderate risk for complications without adequate follow up.  This format is felt to be most appropriate for this patient at this time.  The patient did not have access to video technology/had technical difficulties with video requiring transitioning to audio format only (telephone).  All issues noted in this document were discussed and addressed.  No physical exam could be performed with this format.  Please refer to the patient's chart for her  consent to telehealth for [facility].   The patient was identified using 2 identifiers.  Date:  05/07/2019   ID:  [name], DOB 06-17-1955, MRN [id number]  Patient Location: Home Provider Location: Office  PCP:  Susy Frizzle, MD  Cardiologist:  Dr. Fletcher Anon Electrophysiologist:  None   Evaluation Performed:  Follow-Up Visit  Chief Complaint:  bilateral leg claudication  History of Present Illness:    [name] is a 64 y.o. female who was reached via phone for a follow-up visit regarding peripheral arterial disease and coronary artery disease. She has known history of peripheral arterial disease status post atherectomy of the right SFA in 2011 by Dr. Julianne Handler. She has other chronic medical conditions including CAD, DM, HTN, and hyperlipidemia. She is a former smoker.  She is s/p bilateral TKR.   She had worsening right leg claudication in April 2017. Angiography showed no significant aortoiliac disease. There was moderate right common femoral artery stenosis and severe discrete stenosis in the proximal right SFA with three-vessel runoff below the knee. There was no significant obstructive disease involving the left  lower extremity. A self-expanding stent was placed to the proximal right SFA. She had previous bilateral knee replacement and chronic bilateral leg pain.   She reestablished follow-up with me recently.  She was hospitalized in August pancreatitis.  She is labeled as chronic pancreatitis due to alcohol use but she denies recent alcohol use.  During my recent evaluation, she complained of increased bilateral calf claudication and thus I repeated her vascular studies which showed mildly reduced ABI bilaterally in the 0.8 range.  Duplex showed moderate right common femoral artery stenosis and moderate SFA disease.  On the left, there was severe new stenosis in the proximal SFA. She reports that her leg claudication is worsening and she has quit exercising and walking the dog due to that.  She also used to ride her stationary bike but she stopped due to her symptoms. Due to significant lifestyle limiting claudication, I scheduled her for an angiogram but she had to cancel due to a flareup of pancreatitis.  She did not require hospitalization.  She feels better now and ready to be scheduled the procedure for tomorrow.    The patient does not have symptoms concerning for COVID-19 infection (fever, chills, cough, or new shortness of breath).    Past Medical History:  Diagnosis Date  . Asthma with COPD ([place])    PATIENT DENIES  . CAD (coronary artery disease)    a. [place] 2011: Diffuse distal and branch vessel disease - patient managed medically, no interventional options. b. Nuc 03/2014 - low risk, no ischemia.  . Complication of anesthesia    slow to wake up with last surgery in 2011   .  Diabetes mellitus with circulatory complication (HCC)   . Dyslipidemia   . Headache    hx of migraines   . Hypertension   . Osteoarthritis    severe right knee  R TKR  . PAD (peripheral artery disease) (HCC)    a. Severe stenosis mid right SFA s/p atherectomy 03/25/09. b. peripheral angiography in 12/2011 which  showed only about 50% diffuse right SFA stenosis.  . Pancreatitis 2003  . Renal artery stenosis (HCC)    a. LHC 2011 - 40-50% left RAS.  . Tobacco use disorder    quit 11/10   Past Surgical History:  Procedure Laterality Date  . ABDOMINAL AORTAGRAM N/A 12/21/2011   Procedure: ABDOMINAL Ronny Flurry;  Surgeon: Iran Ouch, MD;  Location: [facility];  Service: Cardiovascular;  Laterality: N/A;  . CARDIAC CATHETERIZATION    . ESOPHAGOGASTRODUODENOSCOPY (EGD) WITH PROPOFOL N/A 10/05/2017   Procedure: ESOPHAGOGASTRODUODENOSCOPY (EGD) WITH PROPOFOL;  Surgeon: Rachael Fee, MD;  Location: [facility];  Service: Endoscopy;  Laterality: N/A;  . EUS N/A 10/05/2017   Procedure: UPPER ENDOSCOPIC ULTRASOUND (EUS) RADIAL;  Surgeon: Rachael Fee, MD;  Location: [facility];  Service: Endoscopy;  Laterality: N/A;  . FOOT SURGERY Left   . LOWER EXTREMITY ANGIOGRAM Bilateral 05/27/2015   Procedure: Lower Extremity Angiogram;  Surgeon: Iran Ouch, MD;  [facility]: [facility];  Service: Cardiovascular;  Laterality: Bilateral;  . PERIPHERAL VASCULAR CATHETERIZATION N/A 05/27/2015   Procedure: Abdominal Aortogram;  Surgeon: Iran Ouch, MD;  Location: [facility];  Service: Cardiovascular;  Laterality: N/A;  . PERIPHERAL VASCULAR CATHETERIZATION Right 05/27/2015   Procedure: Peripheral Vascular Intervention;  Surgeon: Iran Ouch, MD;  [facility]: [facility];  Service: Cardiovascular;  Laterality: Right;  SFA  . TOTAL KNEE ARTHROPLASTY Right 2011  . TOTAL KNEE ARTHROPLASTY Left 05/22/2014   Procedure: LEFT TOTAL KNEE ARTHROPLASTY;  Surgeon: Ranee Gosselin, MD;  Location: [facility];  Service: Orthopedics;  Laterality: Left;  . TUBAL LIGATION  1980     No outpatient medications have been marked as taking for the 05/07/19 encounter (Telemedicine) with Iran Ouch, MD.     Allergies:   Gabapentin, Glucotrol [glipizide], Influenza vaccines, Latex, Nortriptyline, Tetanus  toxoid, and Invokana [canagliflozin]   Social History   Tobacco Use  . Smoking status: Former Smoker    Quit date: 12/13/2008    Years since quitting: 10.4  . Smokeless tobacco: Never Used  Substance Use Topics  . Alcohol use: No    Comment: Quit drinking around year 2000  . Drug use: No    Comment: Patient quit smoking around 2011     Family Hx: The patient's family history includes Breast cancer (age of onset: 55) in her sister; CVA in her mother; Emphysema in her father.  ROS:   Please see the history of present illness.     All other systems reviewed and are negative.   Prior CV studies:   The following studies were reviewed today:    Labs/Other Tests and Data Reviewed:    EKG:  No ECG reviewed.  Recent Labs: 02/18/2019: ALT 17 05/03/2019: BUN 21; Creatinine, Ser 1.03; Hemoglobin 12.5; Platelets 317; Potassium 4.7; Sodium 137   Recent Lipid Panel Lab Results  Component Value Date/Time   CHOL 142 02/18/2019 11:21 AM   TRIG 226 (H) 02/18/2019 11:21 AM   HDL 46 (L) 02/18/2019 11:21 AM   CHOLHDL 3.1 02/18/2019 11:21 AM   LDLCALC 68 02/18/2019 11:21 AM  LDLDIRECT 50.6 01/02/2014 10:03 AM    Wt Readings from Last 3 Encounters:  05/07/19 151 lb (68.5 kg)  04/09/19 151 lb (68.5 kg)  03/05/19 155 lb 3.2 oz (70.4 kg)     Objective:    Vital Signs:  Ht 5\' 3"  (1.6 m)   Wt 151 lb (68.5 kg)   BMI 26.75 kg/m    VITAL SIGNS:  reviewed  ASSESSMENT & PLAN:    1.  PAD: Status post recent stent placement to the proximal right SFA.  Now with severe lifestyle limiting left calf claudication and some right calf claudication.  Duplex new severe stenosis in the left proximal SFA.  Reschedule the angiogram for tomorrow given improvement in GI symptoms. I discussed the procedure in details as well as risks and benefits.  She is already on dual antiplatelet therapy.  Planned access is via the right common femoral artery.  2. Coronary artery disease involving native coronary  arteries without angina:  Previous cardiac catheterization in 2011 showed distal LAD and small branch disease which is being managed medically.  She had worsening chest pain last year but she reports improvement in symptoms since then.  Continue medical therapy.  3. Hyperlipidemia:  Continue treatment with high-dose rosuvastatin.  Recommend a target LDL of less than 70.  Recent lipid profile showed an LDL of 68.  4. Diabetes mellitus: Managed by primary care physician.  COVID-19 Education: The signs and symptoms of COVID-19 were discussed with the patient and how to seek care for testing (follow up with PCP or arrange E-visit).  The importance of social distancing was discussed today.  Time:   Today, I have spent 8 minutes with the patient with telehealth technology discussing the above problems.     Medication Adjustments/Labs and Tests Ordered: Current medicines are reviewed at length with the patient today.  Concerns regarding medicines are outlined above.   Tests Ordered: No orders of the defined types were placed in this encounter.   Medication Changes: No orders of the defined types were placed in this encounter.   Follow Up:  In Person in 1 month(s)  Signed, 2012, MD  05/07/2019 10:25 AM    [facility]

## 2019-05-08 ENCOUNTER — Ambulatory Visit (HOSPITAL_COMMUNITY)
Admission: RE | Admit: 2019-05-08 | Discharge: 2019-05-08 | Disposition: A | Discharge status: Home / Self Care | Payer: 59 | Attending: Cardiovascular Disease | Admitting: Cardiovascular Disease

## 2019-05-08 ENCOUNTER — Other Ambulatory Visit: Payer: Self-pay

## 2019-05-08 ENCOUNTER — Encounter (HOSPITAL_COMMUNITY)
Admission: RE | Disposition: A | Discharge status: Home / Self Care | Payer: 59 | Source: Home / Self Care | Attending: Cardiovascular Disease

## 2019-05-08 DIAGNOSIS — I70223 Atherosclerosis of native arteries of extremities with rest pain, bilateral legs: Secondary | ICD-10-CM | POA: Insufficient documentation

## 2019-05-08 DIAGNOSIS — Z7982 Long term (current) use of aspirin: Secondary | ICD-10-CM | POA: Insufficient documentation

## 2019-05-08 DIAGNOSIS — Z888 Allergy status to other drugs, medicaments and biological substances status: Secondary | ICD-10-CM | POA: Insufficient documentation

## 2019-05-08 DIAGNOSIS — Z87891 Personal history of nicotine dependence: Secondary | ICD-10-CM | POA: Insufficient documentation

## 2019-05-08 DIAGNOSIS — Z96653 Presence of artificial knee joint, bilateral: Secondary | ICD-10-CM | POA: Diagnosis not present

## 2019-05-08 DIAGNOSIS — Z7902 Long term (current) use of antithrombotics/antiplatelets: Secondary | ICD-10-CM | POA: Diagnosis not present

## 2019-05-08 DIAGNOSIS — Z79899 Other long term (current) drug therapy: Secondary | ICD-10-CM | POA: Diagnosis not present

## 2019-05-08 DIAGNOSIS — I70211 Atherosclerosis of native arteries of extremities with intermittent claudication, right leg: Secondary | ICD-10-CM

## 2019-05-08 DIAGNOSIS — I10 Essential (primary) hypertension: Secondary | ICD-10-CM | POA: Insufficient documentation

## 2019-05-08 DIAGNOSIS — I2510 Atherosclerotic heart disease of native coronary artery without angina pectoris: Secondary | ICD-10-CM | POA: Diagnosis not present

## 2019-05-08 DIAGNOSIS — J449 Chronic obstructive pulmonary disease, unspecified: Secondary | ICD-10-CM | POA: Insufficient documentation

## 2019-05-08 DIAGNOSIS — E785 Hyperlipidemia, unspecified: Secondary | ICD-10-CM | POA: Insufficient documentation

## 2019-05-08 DIAGNOSIS — I70212 Atherosclerosis of native arteries of extremities with intermittent claudication, left leg: Secondary | ICD-10-CM | POA: Diagnosis not present

## 2019-05-08 DIAGNOSIS — Z7984 Long term (current) use of oral hypoglycemic drugs: Secondary | ICD-10-CM | POA: Diagnosis not present

## 2019-05-08 DIAGNOSIS — E1151 Type 2 diabetes mellitus with diabetic peripheral angiopathy without gangrene: Secondary | ICD-10-CM | POA: Diagnosis not present

## 2019-05-08 HISTORY — PX: PERIPHERAL VASCULAR ATHERECTOMY: CATH118256

## 2019-05-08 HISTORY — PX: ABDOMINAL AORTOGRAM W/LOWER EXTREMITY: CATH118223

## 2019-05-08 LAB — POCT ACTIVATED CLOTTING TIME
Activated Clotting Time: 230 seconds
Activated Clotting Time: 197 seconds
Activated Clotting Time: 224 seconds
Activated Clotting Time: 164 seconds

## 2019-05-08 LAB — GLUCOSE, CAPILLARY
Glucose-Capillary: 109 mg/dL — ABNORMAL HIGH (ref 70–99)
Glucose-Capillary: 99 mg/dL (ref 70–99)

## 2019-05-08 SURGERY — ABDOMINAL AORTOGRAM W/LOWER EXTREMITY
Anesthesia: LOCAL | Laterality: Left

## 2019-05-08 MED ORDER — FENTANYL CITRATE (PF) 100 MCG/2ML IJ SOLN
INTRAMUSCULAR | Status: AC
  Filled 2019-05-08: qty 2

## 2019-05-08 MED ORDER — FENTANYL CITRATE (PF) 100 MCG/2ML IJ SOLN
50.0000 ug | INTRAMUSCULAR | Status: DC | PRN
  Administered 2019-05-08: 50 ug via INTRAVENOUS
  Filled 2019-05-08: qty 2

## 2019-05-08 MED ORDER — VIPERSLIDE LUBRICANT OPTIME: TOPICAL | Status: DC | PRN

## 2019-05-08 MED ORDER — HEPARIN SODIUM (PORCINE) 1000 UNIT/ML IJ SOLN
INTRAMUSCULAR | Status: AC
  Filled 2019-05-08: qty 1

## 2019-05-08 MED ORDER — SODIUM CHLORIDE 0.9 % IV SOLN: INTRAVENOUS | Status: AC

## 2019-05-08 MED ORDER — SODIUM CHLORIDE 0.9 % IV SOLN: 250.0000 mL | INTRAVENOUS | Status: DC | PRN

## 2019-05-08 MED ORDER — SODIUM CHLORIDE 0.9% FLUSH: 3.0000 mL | INTRAVENOUS | Status: DC | PRN

## 2019-05-08 MED ORDER — MIDAZOLAM HCL 2 MG/2ML IJ SOLN
INTRAMUSCULAR | Status: AC
  Filled 2019-05-08: qty 2

## 2019-05-08 MED ORDER — SODIUM CHLORIDE 0.9% FLUSH: 3.0000 mL | Freq: Two times a day (BID) | INTRAVENOUS | Status: DC

## 2019-05-08 MED ORDER — HEPARIN (PORCINE) IN NACL 1000-0.9 UT/500ML-% IV SOLN
INTRAVENOUS | Status: AC
  Filled 2019-05-08: qty 500

## 2019-05-08 MED ORDER — HYDRALAZINE HCL 20 MG/ML IJ SOLN: 5.0000 mg | INTRAMUSCULAR | Status: DC | PRN

## 2019-05-08 MED ORDER — MIDAZOLAM HCL 2 MG/2ML IJ SOLN
INTRAMUSCULAR | Status: DC | PRN
  Administered 2019-05-08 (×2): 1 mg via INTRAVENOUS

## 2019-05-08 MED ORDER — CLOPIDOGREL BISULFATE 75 MG PO TABS: 75.0000 mg | ORAL_TABLET | ORAL | Status: DC

## 2019-05-08 MED ORDER — LIDOCAINE HCL (PF) 1 % IJ SOLN
INTRAMUSCULAR | Status: DC | PRN
  Administered 2019-05-08: 30 mL via SUBCUTANEOUS

## 2019-05-08 MED ORDER — ONDANSETRON HCL 4 MG/2ML IJ SOLN: 4.0000 mg | Freq: Four times a day (QID) | INTRAMUSCULAR | Status: DC | PRN

## 2019-05-08 MED ORDER — ACETAMINOPHEN 325 MG PO TABS
650.0000 mg | ORAL_TABLET | ORAL | Status: DC | PRN
  Administered 2019-05-08: 650 mg via ORAL
  Filled 2019-05-08: qty 2

## 2019-05-08 MED ORDER — HEPARIN (PORCINE) IN NACL 1000-0.9 UT/500ML-% IV SOLN
INTRAVENOUS | Status: DC | PRN
  Administered 2019-05-08: 500 mL

## 2019-05-08 MED ORDER — NITROGLYCERIN 1 MG/10 ML FOR IR/CATH LAB
INTRA_ARTERIAL | Status: DC | PRN
  Administered 2019-05-08 (×2): 200 ug via INTRA_ARTERIAL

## 2019-05-08 MED ORDER — FENTANYL CITRATE (PF) 100 MCG/2ML IJ SOLN
INTRAMUSCULAR | Status: DC | PRN
  Administered 2019-05-08: 50 ug via INTRAVENOUS
  Administered 2019-05-08: 25 ug via INTRAVENOUS
  Administered 2019-05-08: 50 ug via INTRAVENOUS

## 2019-05-08 MED ORDER — SODIUM CHLORIDE 0.9 % WEIGHT BASED INFUSION
3.0000 mL/kg/h | INTRAVENOUS | Status: AC
  Administered 2019-05-08: 3 mL/kg/h via INTRAVENOUS

## 2019-05-08 MED ORDER — IODIXANOL 320 MG/ML IV SOLN
INTRAVENOUS | Status: DC | PRN
  Administered 2019-05-08: 140 mL via INTRA_ARTERIAL

## 2019-05-08 MED ORDER — HEPARIN SODIUM (PORCINE) 1000 UNIT/ML IJ SOLN
INTRAMUSCULAR | Status: DC | PRN
  Administered 2019-05-08: 2000 [IU] via INTRAVENOUS
  Administered 2019-05-08: 5000 [IU] via INTRAVENOUS

## 2019-05-08 MED ORDER — NITROGLYCERIN IN D5W 200-5 MCG/ML-% IV SOLN
INTRAVENOUS | Status: AC
  Filled 2019-05-08: qty 250

## 2019-05-08 MED ORDER — VERAPAMIL HCL 2.5 MG/ML IV SOLN
INTRAVENOUS | Status: AC
  Filled 2019-05-08: qty 2

## 2019-05-08 MED ORDER — LABETALOL HCL 5 MG/ML IV SOLN: 10.0000 mg | INTRAVENOUS | Status: DC | PRN

## 2019-05-08 MED ORDER — ASPIRIN 81 MG PO CHEW: 81.0000 mg | CHEWABLE_TABLET | ORAL | Status: DC

## 2019-05-08 MED ORDER — SODIUM CHLORIDE 0.9 % WEIGHT BASED INFUSION: 1.0000 mL/kg/h | INTRAVENOUS | Status: DC

## 2019-05-08 SURGICAL SUPPLY — 25 items
BALLN COYOTE ES OTW 4X40X145 (BALLOONS) ×3
BALLOON COYOTE ES OTW 4X40X145 (BALLOONS) IMPLANT
CATH ANGIO 5F PIGTAIL 65CM (CATHETERS) ×1 IMPLANT
CATH CROSS OVER TEMPO 5F (CATHETERS) ×1 IMPLANT
CATH STRAIGHT 5FR 65CM (CATHETERS) ×1 IMPLANT
DCB RANGER 5.0X60 135 (BALLOONS) IMPLANT
DEVICE CONTINUOUS FLUSH (MISCELLANEOUS) ×1 IMPLANT
DIAMONDBACK SOLID OAS 1.5MM (CATHETERS) ×3
GLIDEWIRE ADV .035X260CM (WIRE) ×1 IMPLANT
KIT MICROPUNCTURE NIT STIFF (SHEATH) ×1 IMPLANT
KIT PV (KITS) ×3 IMPLANT
LUBRICANT VIPERSLIDE CORONARY (MISCELLANEOUS) ×1 IMPLANT
RANGER DCB 5.0X60 135 (BALLOONS) ×3
SHEATH PINNACLE 5F 10CM (SHEATH) ×1 IMPLANT
SHEATH PINNACLE 6F 10CM (SHEATH) ×1 IMPLANT
SHEATH PINNACLE MP 6F 45CM (SHEATH) ×1 IMPLANT
SHEATH PROBE COVER 6X72 (BAG) ×1 IMPLANT
STOPCOCK MORSE 400PSI 3WAY (MISCELLANEOUS) ×1 IMPLANT
SYR MEDRAD MARK 7 150ML (SYRINGE) ×3 IMPLANT
SYSTEM DIMNDBCK SLD OAS 1.5MM (CATHETERS) IMPLANT
TRANSDUCER W/STOPCOCK (MISCELLANEOUS) ×3 IMPLANT
TRAY PV CATH (CUSTOM PROCEDURE TRAY) ×3 IMPLANT
TUBING CIL FLEX 10 FLL-RA (TUBING) ×1 IMPLANT
WIRE BENTSON .035X145CM (WIRE) ×1 IMPLANT
WIRE VIPER ADVANCE .017X335CM (WIRE) ×2 IMPLANT

## 2019-05-08 NOTE — Progress Notes (Signed)
Site area: Rt fem art sheath  Site Prior to Removal:  Level 1. Bruising and a hematoma prsent before sheath removal. 4"x1" with bruising. Pressure Applied For: Manual:  Yes  Patient Status During Pull:  A/O Post Pull Site:  Level 1. Rt groin is bruised, but soft.  Post Pull Instructions Given:  Pt understands post instructions. Post Pull Pulses Present: Rt AT present Dressing Applied:  Tegaderm and a 4x5 Bedrest begins @ 12:00:00 pm Comments: Pt leaves cath holding in stable condition. Rt groin is bruised, but soft.

## 2019-05-08 NOTE — Consult Note (Signed)
Hospital Consult    Reason for Consult: Evaluate for right femoral endarterectomy Referring Physician: Dr. Fletcher Anon MRN #:  [id number]  History of Present Illness: This is a 64 y.o. female with history of hypertension, hyperlipidemia, PAD, diabetes, coronary artery disease, pancreatities that vascular surgery has been consulted for evaluation of right femoral endarterectomy.  Patient has been under the care of Dr. Fletcher Anon and underwent left SFA atherectomy today for a focal lesion and was found to have high-grade right common femoral stenosis and vascular surgery was consulted.  She reports years of lower extremity claudication in both legs.  She states she can hardly walk for her legs starting to hurt and quantifies her walking distance at less than 50 feet.  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  She denies active tobacco abuse, but previously smoked.  Other surgery in the right lower extremity is a proximal right SFA stent by Dr. Fletcher Anon in 2017.  She reports no other open surgery or groin surgery on the right leg.  Taking aspirin and plavix.    Past Medical History:  Diagnosis Date  . Asthma with COPD ([place])    PATIENT DENIES  . CAD (coronary artery disease)    a. [place] 2011: Diffuse distal and branch vessel disease - patient managed medically, no interventional options. b. [place] 03/2014 - low risk, no ischemia.  . Complication of anesthesia    slow to wake up with last surgery in 2011   . Diabetes mellitus with circulatory complication ([place])   . Dyslipidemia   . Headache    hx of migraines   . Hypertension   . Osteoarthritis    severe right knee  R TKR  . PAD (peripheral artery disease) ([place])    a. Severe stenosis mid right SFA s/p atherectomy 03/25/09. b. peripheral angiography in 12/2011 which showed only about 50% diffuse right SFA stenosis.  . Pancreatitis 2003  . Renal artery stenosis ([place])    a. [place] 2011 - 40-50% left RAS.  .  Tobacco use disorder    quit 11/10    Past Surgical History:  Procedure Laterality Date  . ABDOMINAL AORTAGRAM N/A 12/21/2011   Procedure: ABDOMINAL Maxcine Ham;  Surgeon: Wellington Hampshire, MD;  Location: [facility];  Service: Cardiovascular;  Laterality: N/A;  . CARDIAC CATHETERIZATION    . ESOPHAGOGASTRODUODENOSCOPY (EGD) WITH PROPOFOL N/A 10/05/2017   Procedure: ESOPHAGOGASTRODUODENOSCOPY (EGD) WITH PROPOFOL;  Surgeon: Milus Banister, MD;  Location: [facility];  Service: Endoscopy;  Laterality: N/A;  . EUS N/A 10/05/2017   Procedure: UPPER ENDOSCOPIC ULTRASOUND (EUS) RADIAL;  Surgeon: Milus Banister, MD;  Location: [facility];  Service: Endoscopy;  Laterality: N/A;  . FOOT SURGERY Left   . LOWER EXTREMITY ANGIOGRAM Bilateral 05/27/2015   Procedure: Lower Extremity Angiogram;  Surgeon: Wellington Hampshire, MD;  Location: [facility];  Service: Cardiovascular;  Laterality: Bilateral;  . PERIPHERAL VASCULAR CATHETERIZATION N/A 05/27/2015   Procedure: Abdominal Aortogram;  Surgeon: Wellington Hampshire, MD;  Location: [facility];  Service: Cardiovascular;  Laterality: N/A;  . PERIPHERAL VASCULAR CATHETERIZATION Right 05/27/2015   Procedure: Peripheral Vascular Intervention;  Surgeon: Wellington Hampshire, MD;  Location: [facility];  Service: Cardiovascular;  Laterality: Right;  SFA  . TOTAL KNEE ARTHROPLASTY Right 2011  . TOTAL KNEE ARTHROPLASTY Left 05/22/2014   Procedure: LEFT TOTAL KNEE ARTHROPLASTY;  Surgeon: Latanya Maudlin, MD;  Location: [facility];  Service:  Orthopedics;  Laterality: Left;  . TUBAL LIGATION  1980    Allergies  Allergen Reactions  . Gabapentin Other (See Comments)    Brain shakes  . Glucotrol [Glipizide] Rash and Other (See Comments)    Red rash  . Influenza Vaccines Other (See Comments)    Significant arm soreness requiring 1 year of physical therapy  . Latex Rash  . Nortriptyline Other (See Comments)    BRAIN SHAKE  . Tetanus Toxoid Hives, Swelling,  Rash and Other (See Comments)    Swelling to site of injection with rash and fever  . Invokana [Canagliflozin] Other (See Comments)    Yeast Infections    Prior to Admission medications   Medication Sig Start Date End Date Taking? Authorizing Provider  acetaminophen (TYLENOL) 500 MG tablet Take 1,000 mg by mouth every 6 (six) hours as needed for moderate pain or headache.   Yes [provider]  ADVAIR DISKUS 250-50 MCG/DOSE AEPB INHALE 1 PUFF BY MOUTH INTO THE LUNGS EVERY 12 HOURS Patient taking differently: Inhale 1 puff into the lungs 2 (two) times daily.  01/01/18  Yes Susy Frizzle, MD  aspirin 81 MG tablet Take 81 mg by mouth daily.     Yes [provider]  Biotin w/ Vitamins C & E (HAIR/SKIN/NAILS PO) Take 1 tablet by mouth daily.   Yes [provider]  bisoprolol (ZEBETA) 5 MG tablet TAKE ONE-HALF TABLET BY MOUTH EVERY DAY Patient taking differently: Take 2.5 mg by mouth at bedtime.  09/18/18  Yes Susy Frizzle, MD  clopidogrel (PLAVIX) 75 MG tablet Take 1 tablet (75 mg total) by mouth daily. 04/06/17  Yes Wellington Hampshire, MD  CREON 36000 units CPEP capsule TAKE 2 CAPSULES BY MOUTH THREE TIMES DAILY BEFORE MEALS Patient taking differently: Take 108,000 Units by mouth 3 (three) times daily before meals.  03/12/18  Yes Susy Frizzle, MD  HYDROcodone-acetaminophen (NORCO/VICODIN) 5-325 MG tablet Take 1 tablet by mouth every 6 (six) hours as needed for moderate pain. Pain [facility], [place] 04/29/19  Yes Susy Frizzle, MD  isosorbide mononitrate (IMDUR) 60 MG 24 hr tablet TAKE 1 TABLET BY MOUTH DAILY Patient taking differently: Take 60 mg by mouth daily.  02/19/18  Yes Susy Frizzle, MD  metFORMIN (GLUCOPHAGE) 1000 MG tablet TAKE 1 TABLET BY MOUTH TWICE DAILY WITH MEALS Patient taking differently: Take 1,000 mg by mouth in the morning and at bedtime.  12/17/18  Yes Susy Frizzle, MD  methocarbamol (ROBAXIN) 500 MG tablet TAKE 1 TABLET BY  MOUTH EVERY 6 HOURS AS NEEDED FOR MUSCLE SPASMS Patient taking differently: Take 500 mg by mouth every 6 (six) hours as needed for muscle spasms.  02/01/19  Yes Susy Frizzle, MD  Multiple Vitamin (MULTIVITAMIN) tablet Take 1 tablet by mouth daily.     Yes [provider]  ondansetron (ZOFRAN) 4 MG tablet TAKE 1 TABLET BY MOUTH EVERY 8 HOURS AS NEEDED FOR NAUSEA OR VOMITING Patient taking differently: Take 4 mg by mouth daily.  10/23/18  Yes Susy Frizzle, MD  pantoprazole (PROTONIX) 40 MG tablet TAKE 1 TABLET BY MOUTH DAILY Patient taking differently: Take 40 mg by mouth daily.  03/21/18  Yes Susy Frizzle, MD  pioglitazone (ACTOS) 30 MG tablet Take 1 tablet (30 mg total) by mouth daily. 08/25/17  Yes Susy Frizzle, MD  Probiotic Product (TRUBIOTICS PO) Take 1 capsule by mouth daily.   Yes [provider]  rosuvastatin (CRESTOR) 40 MG  tablet Take 1 tablet (40 mg total) by mouth at bedtime. 10/17/17  Yes Pickard, Cammie Mcgee, MD  VENTOLIN HFA 108 (90 Base) MCG/ACT inhaler INHALE 2 PUFFS BY MOUTH TWICE DAILY AS NEEDED FOR WHEEZING OR SHORTNESS OF BREATH Patient taking differently: Inhale 1 puff into the lungs 2 (two) times daily as needed for wheezing or shortness of breath.  08/31/17  Yes Susy Frizzle, MD  Blood Glucose Monitoring Suppl (BAYER CONTOUR MONITOR) w/Device KIT CHECK BLOOD SUGAR EVERY DAY 08/20/15   Susy Frizzle, MD  CONTOUR NEXT TEST test strip CHECK BLOOD SUGAR DAILY AS DIRECTED 11/21/18   Susy Frizzle, MD  diclofenac sodium (VOLTAREN) 1 % GEL Apply 2 g topically 4 (four) times daily. Patient taking differently: Apply 2 g topically 4 (four) times daily as needed (for hand pain).  05/04/16   Susy Frizzle, MD  nitroGLYCERIN (NITROSTAT) 0.4 MG SL tablet Place 1 tablet under the tongue every 5 (five) minutes as needed for chest pain. 09/26/18   [provider]    Social History   Socioeconomic History  . Marital status: Married    Spouse  name: Not on file  . Number of children: Not on file  . Years of education: 56  . Highest education level: Not on file  Occupational History  . Not on file  Tobacco Use  . Smoking status: Former Smoker    Quit date: 12/13/2008    Years since quitting: 10.4  . Smokeless tobacco: Never Used  Substance and Sexual Activity  . Alcohol use: No    Comment: Quit drinking around year 2000  . Drug use: No    Comment: Patient quit smoking around 2011  . Sexual activity: Yes    Partners: Male  Other Topics Concern  . Not on file  Social History Narrative   Denies any IV drug use or marijuana use.  Former smoker for 30 years, quit Nov. 2010. She is married with 2 children. Disabled from knee, not working.   Social Determinants of Health   Financial Resource Strain: Medium Risk  . Difficulty of Paying Living Expenses: Somewhat hard  Food Insecurity:   . Worried About Charity fundraiser in the Last Year:   . Arboriculturist in the Last Year:   Transportation Needs: No Transportation Needs  . Lack of Transportation (Medical): No  . Lack of Transportation (Non-Medical): No  Physical Activity:   . Days of Exercise per Week:   . Minutes of Exercise per Session:   Stress:   . Feeling of Stress :   Social Connections: Unknown  . Frequency of Communication with Friends and Family: Once a week  . Frequency of Social Gatherings with Friends and Family: Once a week  . Attends Religious Services: Not on file  . Active Member of Clubs or Organizations: Not on file  . Attends Archivist Meetings: Not on file  . Marital Status: Married  Human resources officer Violence: Not At Risk  . Fear of Current or Ex-Partner: No  . Emotionally Abused: No  . Physically Abused: No  . Sexually Abused: No     Family History  Problem Relation Age of Onset  . CVA Mother   . Emphysema Father   . Breast cancer Sister 91    ROS: [x]  Positive   [ ]  Negative   [ ]  All sytems reviewed and are  negative  Cardiovascular: []  chest pain/pressure []  palpitations []  SOB lying flat []   DOE [x]  pain in legs while walking []  pain in legs at rest []  pain in legs at night []  non-healing ulcers []  hx of DVT []  swelling in legs  Pulmonary: []  productive cough []  asthma/wheezing []  home O2  Neurologic: []  weakness in []  arms []  legs []  numbness in []  arms []  legs []  hx of CVA []  mini stroke [] difficulty speaking or slurred speech []  temporary loss of vision in one eye []  dizziness  Hematologic: []  hx of cancer []  bleeding problems []  problems with blood clotting easily  Endocrine:   []  diabetes []  thyroid disease  GI []  vomiting blood []  blood in stool  GU: []  CKD/renal failure []  HD--[]  M/W/F or []  T/T/S []  burning with urination []  blood in urine  Psychiatric: []  anxiety []  depression  Musculoskeletal: []  arthritis []  joint pain  Integumentary: []  rashes []  ulcers  Constitutional: []  fever []  chills   Physical Examination  Vitals:   05/08/19 1050 05/08/19 1055  BP: (!) 150/56 (!) 147/70  Pulse: 71 73  Resp: (!) 8 19  Temp:    SpO2: 99% 99%   Body mass index is 26.57 kg/m.  General:  NAD Gait: Not observed HENT: WNL, normocephalic Pulmonary: normal non-labored breathing, without Rales, rhonchi,  wheezing Cardiac: regular, without  Murmurs, rubs or gallops Abdomen: soft, NT/ND, no masses Vascular Exam/Pulses:  Right Left  Radial 2+ (normal) 2+ (normal)  Ulnar    Femoral Sheath in place 2+ (normal)  Popliteal    DP Not palpable 1+ palpable  PT Not palpable    Extremities: without ischemic changes, without Gangrene , without cellulitis; without open wounds;  Musculoskeletal: no muscle wasting or atrophy  Neurologic: A&O X 3; Appropriate Affect ; SENSATION: normal; MOTOR FUNCTION:  moving all extremities equally. Speech is fluent/normal  CBC    Component Value Date/Time   WBC 5.7 05/03/2019 1011   WBC 5.6 02/18/2019 1121   RBC  4.40 05/03/2019 1011   RBC 4.37 02/18/2019 1121   HGB 12.5 05/03/2019 1011   HCT 40.1 05/03/2019 1011   PLT 317 05/03/2019 1011   MCV 91 05/03/2019 1011   MCH 28.4 05/03/2019 1011   MCH 28.6 02/18/2019 1121   MCHC 31.2 (L) 05/03/2019 1011   MCHC 31.9 (L) 02/18/2019 1121   RDW 13.3 05/03/2019 1011   LYMPHSABS 1,574 02/18/2019 1121   MONOABS 406 11/24/2015 0826   EOSABS 330 02/18/2019 1121   BASOSABS 62 02/18/2019 1121    BMET    Component Value Date/Time   NA 137 05/03/2019 1011   K 4.7 05/03/2019 1011   CL 99 05/03/2019 1011   CO2 22 05/03/2019 1011   GLUCOSE 149 (H) 05/03/2019 1011   GLUCOSE 129 (H) 02/18/2019 1121   BUN 21 05/03/2019 1011   CREATININE 1.03 (H) 05/03/2019 1011   CREATININE 0.90 02/18/2019 1121   CALCIUM 10.1 05/03/2019 1011   GFRNONAA 58 (L) 05/03/2019 1011   GFRNONAA 57 (L) 01/25/2018 1204   GFRAA 67 05/03/2019 1011   GFRAA 66 01/25/2018 1204    COAGS: Lab Results  Component Value Date   INR 1.03 05/27/2015   INR 0.88 05/13/2014   INR 1.0 12/14/2011     Non-Invasive Vascular Imaging:    I reviewed patient's arteriogram today and agree that she has a high-grade right common femoral artery stenosis with heavy calcification.  SFA remains diffusely calcified and diseased throughout its course.  Appears to have three-vessel runoff in the right lower extremity.   ASSESSMENT/PLAN: This  is a 64 y.o. female that presents with lifestyle limiting short distance claudication of the right lower extremity likely due to high-grade calcified right common femoral artery stenosis.  I agree with Dr. Fletcher Anon that the patient would benefit from right common femoral endarterectomy.  I discussed surgery in detail with Ms. Raczkowski including requirement for general anesthesia and several days in the hospital to recover.  I offered to schedule this as early as next week but she would like to discuss with her husband since she is his primary caregiver and he is disabled.  I  provided my contact information for the office and she will call our office to schedule surgery when she is ready to proceed.  Marty Heck, MD [facility] Office: [place]

## 2019-05-08 NOTE — Discharge Instructions (Signed)

## 2019-05-08 NOTE — Interval H&P Note (Signed)
History and Physical Interval Note:  05/08/2019 8:29 AM  [name]  has presented today for surgery, with the diagnosis of PAD.  The various methods of treatment have been discussed with the patient and family. After consideration of risks, benefits and other options for treatment, the patient has consented to  Procedure(s): ABDOMINAL AORTOGRAM W/LOWER EXTREMITY (Bilateral) as a surgical intervention.  The patient's history has been reviewed, patient examined, no change in status, stable for surgery.  I have reviewed the patient's chart and labs.  Questions were answered to the patient's satisfaction.     Lorine Bears

## 2019-05-13 ENCOUNTER — Other Ambulatory Visit: Payer: Self-pay | Admitting: Family Medicine

## 2019-05-13 ENCOUNTER — Telehealth: Payer: Self-pay | Admitting: Cardiovascular Disease

## 2019-05-13 MED ORDER — ONETOUCH DELICA PLUS LANCET33G MISC: 1.0000 [IU] | Freq: Every day | 3 refills | Status: DC

## 2019-05-13 MED ORDER — ONETOUCH ULTRA 2 W/DEVICE KIT: PACK | 0 refills | Status: DC

## 2019-05-13 MED ORDER — ONETOUCH DELICA LANCING DEV MISC: 3 refills | Status: DC

## 2019-05-13 MED ORDER — ONETOUCH ULTRA VI STRP: ORAL_STRIP | 12 refills | Status: DC

## 2019-05-13 NOTE — Telephone Encounter (Signed)
Returned call to pt she states that she had a procedure last week and she is still bruised around her abdomen and down to her knee she states that he does not hurt but is wondering if this is normal? She takes plavix and was wondering if this was the reason. She states that the bruising does go around to the other side, she does have a "lump on the other side"  also she states that the nurse did tell her that the lump was there but not to worry because there is no pain. She also will be having another surgery with "another doctor" later on for this. She dos have a f/u appt scheduled with Dr Kirke Corin. She states that she will call back if this gets worse or is painful. She states that she will go to [facility] ER if this happens.

## 2019-05-13 NOTE — Telephone Encounter (Signed)
Left a message for the patient to call back.  

## 2019-05-13 NOTE — Telephone Encounter (Signed)
New Message:   Please call,pt says she needs to talk to you about the side effects that she is having after her procedure.

## 2019-05-14 ENCOUNTER — Telehealth: Payer: Self-pay | Admitting: *Deleted

## 2019-05-14 ENCOUNTER — Ambulatory Visit (INDEPENDENT_AMBULATORY_CARE_PROVIDER_SITE_OTHER): Payer: 59 | Admitting: Family Medicine

## 2019-05-14 ENCOUNTER — Other Ambulatory Visit: Payer: Self-pay

## 2019-05-14 ENCOUNTER — Encounter: Payer: Self-pay | Admitting: Family Medicine

## 2019-05-14 VITALS — BP 140/82 | HR 88 | Temp 97.3°F | Resp 16 | Ht 62.5 in | Wt 156.0 lb

## 2019-05-14 DIAGNOSIS — S7011XA Contusion of right thigh, initial encounter: Secondary | ICD-10-CM

## 2019-05-14 DIAGNOSIS — T148XXA Other injury of unspecified body region, initial encounter: Secondary | ICD-10-CM

## 2019-05-14 DIAGNOSIS — I739 Peripheral vascular disease, unspecified: Secondary | ICD-10-CM | POA: Diagnosis not present

## 2019-05-14 LAB — HEMOGLOBIN, FINGERSTICK: POC HEMOGLOBIN: 12.8 g/dL (ref 12.0–15.0)

## 2019-05-14 NOTE — Telephone Encounter (Signed)
Received call from patient.   Reports that she had aortogram on 05/08/2019. Reports that incision was made in groin. States that she has increased discoloration to lower abdomen, groin and leg down to knee. Denies pain. Reports one small area of hard tissue to other side of groin from incision. States that she has small amount of swelling.   Noted that patient contacted surgeon, who recommended ER evaluation if sx worsen.   States that she does not want to go to ER. Advised that she could have bleeding under skin as she is currently on Plavix. Again advised to go to ER for evaluation if she cannot be seen be surgeon.   Again patient declined ER evaluation. Appointment scheduled with PCP to evaluate per patient request.

## 2019-05-14 NOTE — Progress Notes (Signed)
Subjective:    Patient ID: [name], female    DOB: 1956-02-08, 64 y.o.   MRN: [id number]  HPI March 24, the patient had a angiogram of the aorta with bilateral iliofemoral runoff performed due to severe claudication symptoms.  She also had an atherectomy performed of the left SFA.  Due to severe common femoral disease in the right leg, the patient was recommended to have an endarterectomy performed of the right femoral artery at a later date.  Preoperative hemoglobin was 12.5.  Patient has developed significant postoperative bruising in the anterior and posterior upper right leg.  The bruising stops at the level of the knee.  She has a palpable hematoma in the right inguinal canal area.  She reports some burning and stinging pain originating from the right inguinal canal near the femoral nerve radiating down the anterior portion of her right leg.  She denies any weakness in her right leg. Exam was performed with a chaperone present.   Fingerstick hemoglobin was performed today in the office and was found to be 12.8 which is reassuring.  Patient's blood pressure is 140/82 and heart rate is 88.  She does not appear to be in shock.  She denies any chest pain shortness of breath dyspnea on exertion lightheadedness, syncope, or near syncope.  She has palpable pulses in the lower extremities including the dorsalis pedis and posterior tibialis in the right leg. Past Medical History:  Diagnosis Date  . Asthma with COPD ([place])    PATIENT DENIES  . CAD (coronary artery disease)    a. [place] 2011: Diffuse distal and branch vessel disease - patient managed medically, no interventional options. b. [place] 03/2014 - low risk, no ischemia.  . Complication of anesthesia    slow to wake up with last surgery in 2011   . Diabetes mellitus with circulatory complication ([place])   . Dyslipidemia   . Headache    hx of migraines   . Hypertension   . Osteoarthritis    severe right knee  R TKR  . PAD (peripheral artery  disease) ([place])    a. Severe stenosis mid right SFA s/p atherectomy 03/25/09. b. peripheral angiography in 12/2011 which showed only about 50% diffuse right SFA stenosis.  . Pancreatitis 2003  . Renal artery stenosis ([place])    a. [place] 2011 - 40-50% left RAS.  . Tobacco use disorder    quit 11/10   Past Surgical History:  Procedure Laterality Date  . ABDOMINAL AORTAGRAM N/A 12/21/2011   Procedure: ABDOMINAL Maxcine Ham;  Surgeon: Wellington Hampshire, MD;  Location: [facility];  Service: Cardiovascular;  Laterality: N/A;  . ABDOMINAL AORTOGRAM W/LOWER EXTREMITY Bilateral 05/08/2019   Procedure: ABDOMINAL AORTOGRAM W/LOWER EXTREMITY;  Surgeon: Wellington Hampshire, MD;  Location: [facility];  Service: Cardiovascular;  Laterality: Bilateral;  . CARDIAC CATHETERIZATION    . ESOPHAGOGASTRODUODENOSCOPY (EGD) WITH PROPOFOL N/A 10/05/2017   Procedure: ESOPHAGOGASTRODUODENOSCOPY (EGD) WITH PROPOFOL;  Surgeon: Milus Banister, MD;  Location: [facility];  Service: Endoscopy;  Laterality: N/A;  . EUS N/A 10/05/2017   Procedure: UPPER ENDOSCOPIC ULTRASOUND (EUS) RADIAL;  Surgeon: Milus Banister, MD;  Location: [facility];  Service: Endoscopy;  Laterality: N/A;  . FOOT SURGERY Left   . LOWER EXTREMITY ANGIOGRAM Bilateral 05/27/2015   Procedure: Lower Extremity Angiogram;  Surgeon: Wellington Hampshire, MD;  Location: [facility];  Service: Cardiovascular;  Laterality: Bilateral;  . PERIPHERAL VASCULAR ATHERECTOMY Left 05/08/2019   Procedure: PERIPHERAL VASCULAR  ATHERECTOMY;  Surgeon: Wellington Hampshire, MD;  Location: [facility];  Service: Cardiovascular;  Laterality: Left;  . PERIPHERAL VASCULAR CATHETERIZATION N/A 05/27/2015   Procedure: Abdominal Aortogram;  Surgeon: Wellington Hampshire, MD;  Location: [facility];  Service: Cardiovascular;  Laterality: N/A;  . PERIPHERAL VASCULAR CATHETERIZATION Right 05/27/2015   Procedure: Peripheral Vascular Intervention;  Surgeon: Wellington Hampshire, MD;   Location: [facility];  Service: Cardiovascular;  Laterality: Right;  SFA  . TOTAL KNEE ARTHROPLASTY Right 2011  . TOTAL KNEE ARTHROPLASTY Left 05/22/2014   Procedure: LEFT TOTAL KNEE ARTHROPLASTY;  Surgeon: Latanya Maudlin, MD;  Location: [facility];  Service: Orthopedics;  Laterality: Left;  . TUBAL LIGATION  1980   Current Outpatient Medications on File Prior to Visit  Medication Sig Dispense Refill  . acetaminophen (TYLENOL) 500 MG tablet Take 1,000 mg by mouth every 6 (six) hours as needed for moderate pain or headache.    . ADVAIR DISKUS 250-50 MCG/DOSE AEPB INHALE 1 PUFF BY MOUTH INTO THE LUNGS EVERY 12 HOURS (Patient taking differently: Inhale 1 puff into the lungs 2 (two) times daily. ) 60 each 5  . aspirin 81 MG tablet Take 81 mg by mouth daily.      . Biotin w/ Vitamins C & E (HAIR/SKIN/NAILS PO) Take 1 tablet by mouth daily.    . bisoprolol (ZEBETA) 5 MG tablet TAKE ONE-HALF TABLET BY MOUTH EVERY DAY (Patient taking differently: Take 2.5 mg by mouth at bedtime. ) 90 tablet 3  . Blood Glucose Monitoring Suppl (BAYER CONTOUR MONITOR) w/Device KIT CHECK BLOOD SUGAR EVERY DAY 1 kit 0  . Blood Glucose Monitoring Suppl (ONE TOUCH ULTRA 2) w/Device KIT Check BS daily 1 kit 0  . clopidogrel (PLAVIX) 75 MG tablet Take 1 tablet (75 mg total) by mouth daily. 30 tablet 11  . CONTOUR NEXT TEST test strip CHECK BLOOD SUGAR DAILY AS DIRECTED 100 strip 3  . CREON 36000 units CPEP capsule TAKE 2 CAPSULES BY MOUTH THREE TIMES DAILY BEFORE MEALS (Patient taking differently: Take 108,000 Units by mouth 3 (three) times daily before meals. ) 180 capsule 0  . diclofenac sodium (VOLTAREN) 1 % GEL Apply 2 g topically 4 (four) times daily. (Patient taking differently: Apply 2 g topically 4 (four) times daily as needed (for hand pain). ) 100 g 0  . glucose blood (ONETOUCH ULTRA) test strip Check BS daily  DX: E11.9 100 each 12  . HYDROcodone-acetaminophen (NORCO/VICODIN) 5-325 MG tablet Take 1 tablet by mouth  every 6 (six) hours as needed for moderate pain. Pain Management, [place] 130 tablet 0  . isosorbide mononitrate (IMDUR) 60 MG 24 hr tablet TAKE 1 TABLET BY MOUTH DAILY (Patient taking differently: Take 60 mg by mouth daily. ) 90 tablet 2  . Lancet Devices (ONE TOUCH DELICA LANCING DEV) MISC Use daily DxE11.59 100 each 3  . Lancets (ONETOUCH DELICA PLUS [id number]) MISC 1 Units by Other route daily. 100 each 3  . metFORMIN (GLUCOPHAGE) 1000 MG tablet TAKE 1 TABLET BY MOUTH TWICE DAILY WITH MEALS (Patient taking differently: Take 1,000 mg by mouth in the morning and at bedtime. ) 180 tablet 0  . methocarbamol (ROBAXIN) 500 MG tablet TAKE 1 TABLET BY MOUTH EVERY 6 HOURS AS NEEDED FOR MUSCLE SPASMS (Patient taking differently: Take 500 mg by mouth every 6 (six) hours as needed for muscle spasms. ) 120 tablet 2  . Multiple Vitamin (MULTIVITAMIN) tablet Take 1 tablet by mouth daily.      Marland Kitchen  nitroGLYCERIN (NITROSTAT) 0.4 MG SL tablet Place 1 tablet under the tongue every 5 (five) minutes as needed for chest pain.    Marland Kitchen ondansetron (ZOFRAN) 4 MG tablet TAKE 1 TABLET BY MOUTH EVERY 8 HOURS AS NEEDED FOR NAUSEA OR VOMITING (Patient taking differently: Take 4 mg by mouth daily. ) 30 tablet 2  . pantoprazole (PROTONIX) 40 MG tablet TAKE 1 TABLET BY MOUTH DAILY (Patient taking differently: Take 40 mg by mouth daily. ) 90 tablet 3  . pioglitazone (ACTOS) 30 MG tablet Take 1 tablet (30 mg total) by mouth daily. 90 tablet 3  . Probiotic Product (TRUBIOTICS PO) Take 1 capsule by mouth daily.    . rosuvastatin (CRESTOR) 40 MG tablet Take 1 tablet (40 mg total) by mouth at bedtime. 90 tablet 3  . VENTOLIN HFA 108 (90 Base) MCG/ACT inhaler INHALE 2 PUFFS BY MOUTH TWICE DAILY AS NEEDED FOR WHEEZING OR SHORTNESS OF BREATH (Patient taking differently: Inhale 1 puff into the lungs 2 (two) times daily as needed for wheezing or shortness of breath. ) 18 g 3   No current facility-administered medications on file prior to  visit.   Allergies  Allergen Reactions  . Gabapentin Other (See Comments)    Brain shakes  . Glucotrol [Glipizide] Rash and Other (See Comments)    Red rash  . Influenza Vaccines Other (See Comments)    Significant arm soreness requiring 1 year of physical therapy  . Latex Rash  . Nortriptyline Other (See Comments)    BRAIN SHAKE  . Tetanus Toxoid Hives, Swelling, Rash and Other (See Comments)    Swelling to site of injection with rash and fever  . Invokana [Canagliflozin] Other (See Comments)    Yeast Infections   Social History   Socioeconomic History  . Marital status: Married    Spouse name: Not on file  . Number of children: Not on file  . Years of education: 41  . Highest education level: Not on file  Occupational History  . Not on file  Tobacco Use  . Smoking status: Former Smoker    Quit date: 12/13/2008    Years since quitting: 10.4  . Smokeless tobacco: Never Used  Substance and Sexual Activity  . Alcohol use: No    Comment: Quit drinking around year 2000  . Drug use: No    Comment: Patient quit smoking around 2011  . Sexual activity: Yes    Partners: Male  Other Topics Concern  . Not on file  Social History Narrative   Denies any IV drug use or marijuana use.  Former smoker for 30 years, quit Nov. 2010. She is married with 2 children. Disabled from knee, not working.   Social Determinants of Health   Financial Resource Strain: Medium Risk  . Difficulty of Paying Living Expenses: Somewhat hard  Food Insecurity:   . Worried About Charity fundraiser in the Last Year:   . Arboriculturist in the Last Year:   Transportation Needs: No Transportation Needs  . Lack of Transportation (Medical): No  . Lack of Transportation (Non-Medical): No  Physical Activity:   . Days of Exercise per Week:   . Minutes of Exercise per Session:   Stress:   . Feeling of Stress :   Social Connections: Unknown  . Frequency of Communication with Friends and Family: Once a  week  . Frequency of Social Gatherings with Friends and Family: Once a week  . Attends Religious Services: Not on file  .  Active Member of Clubs or Organizations: Not on file  . Attends Archivist Meetings: Not on file  . Marital Status: Married  Human resources officer Violence: Not At Risk  . Fear of Current or Ex-Partner: No  . Emotionally Abused: No  . Physically Abused: No  . Sexually Abused: No      Review of Systems  All other systems reviewed and are negative.      Objective:   Physical Exam Vitals reviewed.  Constitutional:      General: She is not in acute distress.    Appearance: Normal appearance. She is not ill-appearing or toxic-appearing.  Cardiovascular:     Rate and Rhythm: Normal rate.     Pulses: Normal pulses.     Heart sounds: Normal heart sounds. No murmur.  Pulmonary:     Effort: Pulmonary effort is normal.     Breath sounds: Normal breath sounds.  Musculoskeletal:        General: Swelling and deformity present.     Right lower leg: No edema.     Left lower leg: No edema.       Legs:  Skin:    Findings: Bruising present.  Neurological:     Mental Status: She is alert.           Assessment & Plan:  Hematoma - Plan: Hemoglobin, fingerstick  PAD (peripheral artery disease) ([place])  Patient certainly has a large hematoma in her right leg.  I believe that hematoma is likely causing pressure on the right femoral nerve giving her her neuropathic symptoms.  However the good news is I believe the bleeding has subsided.  Her blood pressure today is normal.  Her heart rate is normal.  Her fingerstick hemoglobin is normal.  There is no evidence of active bleeding or shock.  Therefore I anticipate the hematoma will gradually slowly resolve over the next 3 to 4 weeks.  I recommended follow-up as planned with her vascular surgeon to discuss right femoral endarterectomy is recommended.  I see no reason to go to the emergency room today.

## 2019-05-14 NOTE — Telephone Encounter (Signed)
The bruising will take weeks to disappear. This is ok as long as no swelling in the groin or pain. I think she had some bleeding after the sheath was removed. She bleeds easy because of calcified vessels and being on plavix. She should not stop Plavix.

## 2019-05-15 NOTE — Telephone Encounter (Signed)
Left a message for the patient to call back.  

## 2019-05-16 NOTE — Telephone Encounter (Signed)
Call placed to the patient. She stated that she went to her PCP to have the bruising checked out. They did not feel it was anything concerning.   She did say that it has started to get better now. She denies any pain.  She is waiting to hear from Dr. Ophelia Charter office to set up an appointment for the next procedure.

## 2019-05-17 ENCOUNTER — Emergency Department (HOSPITAL_COMMUNITY)
Admission: EM | Admit: 2019-05-17 | Discharge: 2019-05-17 | Disposition: A | Discharge status: Home / Self Care | Payer: 59 | Attending: Emergency Medicine | Admitting: Emergency Medicine

## 2019-05-17 ENCOUNTER — Encounter (HOSPITAL_COMMUNITY): Payer: Self-pay | Admitting: Emergency Medicine

## 2019-05-17 ENCOUNTER — Other Ambulatory Visit: Payer: Self-pay

## 2019-05-17 ENCOUNTER — Emergency Department (HOSPITAL_BASED_OUTPATIENT_CLINIC_OR_DEPARTMENT_OTHER): Payer: 59

## 2019-05-17 DIAGNOSIS — Z7982 Long term (current) use of aspirin: Secondary | ICD-10-CM | POA: Diagnosis not present

## 2019-05-17 DIAGNOSIS — I2510 Atherosclerotic heart disease of native coronary artery without angina pectoris: Secondary | ICD-10-CM | POA: Insufficient documentation

## 2019-05-17 DIAGNOSIS — Z79899 Other long term (current) drug therapy: Secondary | ICD-10-CM | POA: Diagnosis not present

## 2019-05-17 DIAGNOSIS — Z87891 Personal history of nicotine dependence: Secondary | ICD-10-CM | POA: Diagnosis not present

## 2019-05-17 DIAGNOSIS — N183 Chronic kidney disease, stage 3 unspecified: Secondary | ICD-10-CM | POA: Insufficient documentation

## 2019-05-17 DIAGNOSIS — L7622 Postprocedural hemorrhage and hematoma of skin and subcutaneous tissue following other procedure: Secondary | ICD-10-CM | POA: Diagnosis not present

## 2019-05-17 DIAGNOSIS — I724 Aneurysm of artery of lower extremity: Secondary | ICD-10-CM | POA: Diagnosis not present

## 2019-05-17 DIAGNOSIS — Z91040 Latex allergy status: Secondary | ICD-10-CM | POA: Insufficient documentation

## 2019-05-17 DIAGNOSIS — T148XXA Other injury of unspecified body region, initial encounter: Secondary | ICD-10-CM

## 2019-05-17 DIAGNOSIS — G8918 Other acute postprocedural pain: Secondary | ICD-10-CM

## 2019-05-17 DIAGNOSIS — E1122 Type 2 diabetes mellitus with diabetic chronic kidney disease: Secondary | ICD-10-CM | POA: Insufficient documentation

## 2019-05-17 DIAGNOSIS — I129 Hypertensive chronic kidney disease with stage 1 through stage 4 chronic kidney disease, or unspecified chronic kidney disease: Secondary | ICD-10-CM | POA: Insufficient documentation

## 2019-05-17 NOTE — Discharge Instructions (Signed)
1.  Continue to elevate your leg is much as possible. 2.  Call Dr. Kary Kos and schedule a follow-up appointment for recheck. 3.  Return if you develop redness or signs of infection, fever, numbness or loss of sensation in the leg or foot.

## 2019-05-17 NOTE — Telephone Encounter (Signed)
Contacted patient- she states that she spoke with Lisa,RN yesterday- and thought that the bruising was getting better but she woke this morning and it is continuing to grow down into her Calf. She states that she would like to see someone today to have it checked. I did go speak to our DOD today Dr.Schumann- and he suggested that with her sensational issues of having something running down her leg, and the increased bruising and soreness that she should go to the ER. She may need dopplers- and some blood work to check and make sure blood flow is correct.  Patient verbalized understanding.

## 2019-05-17 NOTE — ED Notes (Signed)
Family at bedside. 

## 2019-05-17 NOTE — Telephone Encounter (Signed)
Follow up    Patient had a procedure on her leg on the 24th by Dr Kirke Corin.  She talked to the nurse yesterday about the bruising from her hip to her knee on her right leg.  Today, the bruising is now past her knee to her calf.  Patient is concerned and wanted to see Dr Kirke Corin today or talk to someone.  If possible, please call her this am.

## 2019-05-17 NOTE — ED Provider Notes (Signed)
[facility] EMERGENCY DEPARTMENT Provider Note   CSN: [id number] Arrival date & time: 05/17/19  8937     History Chief Complaint  Patient presents with  . Post-op Problem    [name] is a 64 y.o. female.  HPI Patient is status post endarterectomy and balloon angioplasty of the right femoral artery for claudications 05/08/2019 by Dr. Fletcher Anon.  She developed a large hematoma over the following days.  She reports that the discoloration and swelling has moved down the inside of her leg and is now behind her knee as well as the top of her calf.  She has a sensation of something running down the inside of her leg.  She was concern for ongoing bleeding.  She has been elevating it but it is not improving.  She contacted Dr. Tyrell Antonio office and was advised to come to the emergency department for evaluation.    Past Medical History:  Diagnosis Date  . Asthma with COPD ([place])    PATIENT DENIES  . CAD (coronary artery disease)    a. [place] 2011: Diffuse distal and branch vessel disease - patient managed medically, no interventional options. b. [place] 03/2014 - low risk, no ischemia.  . Complication of anesthesia    slow to wake up with last surgery in 2011   . 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 Dyslipidemia   . Headache    hx of migraines   . Hypertension   . Osteoarthritis    severe right knee  R TKR  . PAD (peripheral artery disease) ([place])    a. Severe stenosis mid right SFA s/p atherectomy 03/25/09. b. peripheral angiography in 12/2011 which showed only about 50% diffuse right SFA stenosis.  . Pancreatitis 2003  . Renal artery stenosis ([place])    a. [place] 2011 - 40-50% left RAS.  . Tobacco use disorder    quit 11/10    Patient Active Problem List   Diagnosis Date Noted  . Chronic pancreatitis ([place]) 09/28/2018  . Asthma with COPD ([place])   . CKD (chronic kidney disease), stage III   . Renal artery stenosis ([place])   . CAD (coronary artery disease)   .  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 Dyslipidemia   . History of bilateral knee replacement 05/22/2014  . Cutaneous skin tags 09/09/2012  . Bilateral leg pain 07/20/2012  . Arthritis of knee 10/25/2011  . GERD (gastroesophageal reflux disease) 11/04/2010  . PAD (peripheral artery disease) ([place]) 01/30/2009  . Diabetes mellitus, type II ([place]) 01/02/2009  . TOBACCO ABUSE 01/02/2009  . Essential hypertension 01/02/2009    Past Surgical History:  Procedure Laterality Date  . ABDOMINAL AORTAGRAM N/A 12/21/2011   Procedure: ABDOMINAL Maxcine Ham;  Surgeon: Wellington Hampshire, MD;  Location: [facility] CATH LAB;  Service: Cardiovascular;  Laterality: N/A;  . ABDOMINAL AORTOGRAM W/LOWER EXTREMITY Bilateral 05/08/2019   Procedure: ABDOMINAL AORTOGRAM W/LOWER EXTREMITY;  Surgeon: Wellington Hampshire, MD;  Location: [facility] CV LAB;  Service: Cardiovascular;  Laterality: Bilateral;  . CARDIAC CATHETERIZATION    . ESOPHAGOGASTRODUODENOSCOPY (EGD) WITH PROPOFOL N/A 10/05/2017   Procedure: ESOPHAGOGASTRODUODENOSCOPY (EGD) WITH PROPOFOL;  Surgeon: Milus Banister, MD;  Location: [facility] ENDOSCOPY;  Service: Endoscopy;  Laterality: N/A;  . EUS N/A 10/05/2017   Procedure: UPPER ENDOSCOPIC ULTRASOUND (EUS) RADIAL;  Surgeon: Milus Banister, MD;  Location: [facility] ENDOSCOPY;  Service: Endoscopy;  Laterality: N/A;  . FOOT SURGERY Left   . LOWER EXTREMITY ANGIOGRAM Bilateral 05/27/2015  Procedure: Lower Extremity Angiogram;  Surgeon: Wellington Hampshire, MD;  Location: [facility] CV LAB;  Service: Cardiovascular;  Laterality: Bilateral;  . PERIPHERAL VASCULAR ATHERECTOMY Left 05/08/2019   Procedure: PERIPHERAL VASCULAR ATHERECTOMY;  Surgeon: Wellington Hampshire, MD;  Location: [facility] CV LAB;  Service: Cardiovascular;  Laterality: Left;  . PERIPHERAL VASCULAR CATHETERIZATION N/A 05/27/2015   Procedure: Abdominal Aortogram;  Surgeon: Wellington Hampshire, MD;  Location: [facility] CV LAB;  Service: Cardiovascular;   Laterality: N/A;  . PERIPHERAL VASCULAR CATHETERIZATION Right 05/27/2015   Procedure: Peripheral Vascular Intervention;  Surgeon: Wellington Hampshire, MD;  Location: [facility] CV LAB;  Service: Cardiovascular;  Laterality: Right;  SFA  . TOTAL KNEE ARTHROPLASTY Right 2011  . TOTAL KNEE ARTHROPLASTY Left 05/22/2014   Procedure: LEFT TOTAL KNEE ARTHROPLASTY;  Surgeon: Latanya Maudlin, MD;  Location: [facility];  Service: Orthopedics;  Laterality: Left;  . TUBAL LIGATION  1980     OB History   No obstetric history on file.     Family History  Problem Relation Age of Onset  . CVA Mother   . Emphysema Father   . Breast cancer Sister 46    Social History   Tobacco Use  . Smoking status: Former Smoker    Quit date: 12/13/2008    Years since quitting: 10.4  . Smokeless tobacco: Never Used  Substance Use Topics  . Alcohol use: No    Comment: Quit drinking around year 2000  . Drug use: No    Comment: Patient quit smoking around 2011    Home Medications Prior to Admission medications   Medication Sig Start Date End Date Taking? Authorizing Provider  acetaminophen (TYLENOL) 500 MG tablet Take 1,000 mg by mouth every 6 (six) hours as needed for moderate pain or headache.    [provider]  ADVAIR DISKUS 250-50 MCG/DOSE AEPB INHALE 1 PUFF BY MOUTH INTO THE LUNGS EVERY 12 HOURS Patient taking differently: Inhale 1 puff into the lungs 2 (two) times daily.  01/01/18   Susy Frizzle, MD  aspirin 81 MG tablet Take 81 mg by mouth daily.      [provider]  Biotin w/ Vitamins C & E (HAIR/SKIN/NAILS PO) Take 1 tablet by mouth daily.    [provider]  bisoprolol (ZEBETA) 5 MG tablet TAKE ONE-HALF TABLET BY MOUTH EVERY DAY Patient taking differently: Take 2.5 mg by mouth at bedtime.  09/18/18   Susy Frizzle, MD  Blood Glucose Monitoring Suppl (BAYER CONTOUR MONITOR) w/Device KIT CHECK BLOOD SUGAR EVERY DAY 08/20/15   Susy Frizzle, MD  Blood Glucose Monitoring  Suppl (ONE TOUCH ULTRA 2) w/Device KIT Check BS daily 05/13/19   Susy Frizzle, MD  clopidogrel (PLAVIX) 75 MG tablet Take 1 tablet (75 mg total) by mouth daily. 04/06/17   Wellington Hampshire, MD  CONTOUR NEXT TEST test strip CHECK BLOOD SUGAR DAILY AS DIRECTED 11/21/18   Susy Frizzle, MD  CREON 36000 units CPEP capsule TAKE 2 CAPSULES BY MOUTH THREE TIMES DAILY BEFORE MEALS Patient taking differently: Take 108,000 Units by mouth 3 (three) times daily before meals.  03/12/18   Susy Frizzle, MD  diclofenac sodium (VOLTAREN) 1 % GEL Apply 2 g topically 4 (four) times daily. Patient taking differently: Apply 2 g topically 4 (four) times daily as needed (for hand pain).  05/04/16   Susy Frizzle, MD  glucose blood [facility] ULTRA) test strip Check BS daily  DX: E11.9 05/13/19  Susy Frizzle, MD  HYDROcodone-acetaminophen (NORCO/VICODIN) 5-325 MG tablet Take 1 tablet by mouth every 6 (six) hours as needed for moderate pain. Pain [facility], [place] 04/29/19   Susy Frizzle, MD  isosorbide mononitrate (IMDUR) 60 MG 24 hr tablet TAKE 1 TABLET BY MOUTH DAILY Patient taking differently: Take 60 mg by mouth daily.  02/19/18   Susy Frizzle, MD  Lancet Devices (ONE TOUCH DELICA LANCING DEV) MISC Use daily DxE11.59 05/13/19   Susy Frizzle, MD  Lancets [facility] DELICA PLUS OFHQRF75O) MISC 1 Units by Other route daily. 05/13/19   Susy Frizzle, MD  metFORMIN (GLUCOPHAGE) 1000 MG tablet TAKE 1 TABLET BY MOUTH TWICE DAILY WITH MEALS Patient taking differently: Take 1,000 mg by mouth in the morning and at bedtime.  12/17/18   Susy Frizzle, MD  methocarbamol (ROBAXIN) 500 MG tablet TAKE 1 TABLET BY MOUTH EVERY 6 HOURS AS NEEDED FOR MUSCLE SPASMS Patient taking differently: Take 500 mg by mouth every 6 (six) hours as needed for muscle spasms.  02/01/19   Susy Frizzle, MD  Multiple Vitamin (MULTIVITAMIN) tablet Take 1 tablet by mouth daily.      [provider]    nitroGLYCERIN (NITROSTAT) 0.4 MG SL tablet Place 1 tablet under the tongue every 5 (five) minutes as needed for chest pain. 09/26/18   [provider]  ondansetron (ZOFRAN) 4 MG tablet TAKE 1 TABLET BY MOUTH EVERY 8 HOURS AS NEEDED FOR NAUSEA OR VOMITING Patient taking differently: Take 4 mg by mouth daily.  10/23/18   Susy Frizzle, MD  pantoprazole (PROTONIX) 40 MG tablet TAKE 1 TABLET BY MOUTH DAILY Patient taking differently: Take 40 mg by mouth daily.  03/21/18   Susy Frizzle, MD  pioglitazone (ACTOS) 30 MG tablet Take 1 tablet (30 mg total) by mouth daily. 08/25/17   Susy Frizzle, MD  Probiotic Product (TRUBIOTICS PO) Take 1 capsule by mouth daily.    [provider]  rosuvastatin (CRESTOR) 40 MG tablet Take 1 tablet (40 mg total) by mouth at bedtime. 10/17/17   Susy Frizzle, MD  VENTOLIN HFA 108 (90 Base) MCG/ACT inhaler INHALE 2 PUFFS BY MOUTH TWICE DAILY AS NEEDED FOR WHEEZING OR SHORTNESS OF BREATH Patient taking differently: Inhale 1 puff into the lungs 2 (two) times daily as needed for wheezing or shortness of breath.  08/31/17   Susy Frizzle, MD    Allergies    Gabapentin, Glucotrol [glipizide], Influenza vaccines, Latex, Nortriptyline, Tetanus toxoid, and Invokana [canagliflozin]  Review of Systems   Review of Systems 10 Systems reviewed and are negative for acute change except as noted in the HPI.  Physical Exam Updated Vital Signs BP 110/72 (BP Location: Left Arm)   Pulse 80   Temp 98.2 F (36.8 C) (Oral)   Resp 17   Ht 5' 2.5" (1.588 m)   Wt 68.5 kg   SpO2 98%   BMI 27.18 kg/m   Physical Exam Constitutional:      Comments: Alert nontoxic well in appearance.  Eyes:     Extraocular Movements: Extraocular movements intact.  Cardiovascular:     Rate and Rhythm: Normal rate and regular rhythm.  Pulmonary:     Effort: Pulmonary effort is normal.     Breath sounds: Normal breath sounds.  Abdominal:     General: There is no  distension.     Palpations: Abdomen is soft.     Tenderness: There is no abdominal tenderness. There is  no guarding.  Musculoskeletal:     Comments: Small nonpulsatile hematoma in the right groin.  No surrounding enlargement.  Large swath of resolving ecchymosis on the mons pubis and the right lower extremity.  See attached images.  Skin:    General: Skin is warm and dry.  Neurological:     General: No focal deficit present.     Mental Status: She is oriented to person, place, and time.     Coordination: Coordination normal.  Psychiatric:        Mood and Affect: Mood normal.         ED Results / Procedures / Treatments   Labs (all labs ordered are listed, but only abnormal results are displayed) Labs Reviewed - No data to display  EKG None  Radiology VAS [place] ABI WITH/WO TBI  Result Date: 05/21/2019 LOWER EXTREMITY DOPPLER STUDY Indications: S/P left SFA atherectomy 05/08/19. Patient states her left leg feels              better and she doesn't notice the pain in the left calf when she              walks. She had extensive bruising of the entire right leg after the              procedure that took weeks to get better. High Risk Factors: Hypertension, hyperlipidemia, Diabetes, past history of                    smoking, coronary artery disease.  Vascular Interventions: S/P orbital atherectomy and drug-coated balloon                         angioplasty of the left SFA by Dr. Fletcher Anon on 05/08/19                          Right proximal SFA stent in 4/17. Comparison Study: Previous ABI's 03/14/19 were 0.83 on the right and 0.82 on the                   left. Performing Technologist: Mariane Masters RVT  Examination Guidelines: A complete evaluation includes at minimum, Doppler waveform signals and systolic blood pressure reading at the level of bilateral brachial, anterior tibial, and posterior tibial arteries, when vessel segments are accessible. Bilateral testing is considered an integral part  of a complete examination. Photoelectric Plethysmograph (PPG) waveforms and toe systolic pressure readings are included as required and additional duplex testing as needed. Limited examinations for reoccurring indications may be performed as noted.  ABI Findings: +---------+------------------+-----+----------+--------+ Right    Rt Pressure (mmHg)IndexWaveform  Comment  +---------+------------------+-----+----------+--------+ Brachial 110                                       +---------+------------------+-----+----------+--------+ ATA      92                0.79 biphasic           +---------+------------------+-----+----------+--------+ PTA      97                0.84 monophasic         +---------+------------------+-----+----------+--------+ PERO     103               0.89 monophasic         +---------+------------------+-----+----------+--------+  Great Toe59                0.51 Abnormal           +---------+------------------+-----+----------+--------+ +---------+------------------+-----+----------+-------+ Left     Lt Pressure (mmHg)IndexWaveform  Comment +---------+------------------+-----+----------+-------+ Brachial 116                                      +---------+------------------+-----+----------+-------+ ATA      114               0.98 biphasic          +---------+------------------+-----+----------+-------+ PTA      125               1.08 biphasic          +---------+------------------+-----+----------+-------+ PERO     117               1.01 monophasic        +---------+------------------+-----+----------+-------+ Great Toe96                0.83 Abnormal          +---------+------------------+-----+----------+-------+ +-------+-----------+-----------+------------+------------+ ABI/TBIToday's ABIToday's TBIPrevious ABIPrevious TBI +-------+-----------+-----------+------------+------------+ Right  0.89       0.51        0.83        0.58         +-------+-----------+-----------+------------+------------+ Left   1.08       0.83       0.82        0.64         +-------+-----------+-----------+------------+------------+ Right ABIs appear essentially unchanged compared to prior study on 03/14/19. Left ABIs appear increased compared to prior study on 03/14/19.  Summary: Right: Resting right ankle-brachial index indicates mild right lower extremity arterial disease. The right toe-brachial index is abnormal. Left: Resting left ankle-brachial index is within normal range. No evidence of significant left lower extremity arterial disease. The left toe-brachial index is normal.  *See table(s) above for measurements and observations. See arterial duplex report.  Suggest follow up study in 6 months.    Preliminary    VAS [place] LOWER EXTREMITY ARTERIAL DUPLEX  Result Date: 05/21/2019 LOWER EXTREMITY ARTERIAL DUPLEX STUDY Indications: S/P left SFA atherectomy 05/08/19. Patient states her left leg feels              better and she doesn't notice the pain in the left calf when she              walks. She had extensive bruising of the entire right leg after the              procedure that took weeks to get better. High Risk Factors: Hypertension, hyperlipidemia, Diabetes, past history of                    smoking, coronary artery disease.  Vascular Interventions: S/P orbital atherectomy and drug-coated balloon                         angioplasty of the left SFA by Dr. Fletcher Anon 05/08/19.                         Right proximal SFA stenting in 4/17. Current ABI:            Today's ABIs were 0.89 on the right and 1.08  on the left Comparison Study: Previous arterial duplex 03/14/19 showed 75-99% left SFA                   proximal stenosis (PSV of 470 cm/sec). 50-74% stenosis of the                   right CFA and SFA. Performing Technologist: Mariane Masters RVT  Examination Guidelines: A complete evaluation includes B-mode imaging, spectral Doppler,  color Doppler, and power Doppler as needed of all accessible portions of each vessel. Bilateral testing is considered an integral part of a complete examination. Limited examinations for reoccurring indications may be performed as noted.  Limited evaluation of the right common femoral artery access site revealed a non-vascular hematoma measuring 2.8 x 1.1 cm. >50% stenosis of the right common femoral artery with difficult insonation due to calcific plaque.  +----------+--------+-----+---------------+---------+---------------------+ LEFT      PSV cm/sRatioStenosis       Waveform Comments              +----------+--------+-----+---------------+---------+---------------------+ CFA Prox  152                         triphasicplaque                +----------+--------+-----+---------------+---------+---------------------+ CFA Distal181          30-49% stenosistriphasicplaque                +----------+--------+-----+---------------+---------+---------------------+ DFA       110                         biphasic                       +----------+--------+-----+---------------+---------+---------------------+ SFA Prox  125                         triphasicplaque                +----------+--------+-----+---------------+---------+---------------------+ SFA Mid   154          30-49% stenosistriphasicplaque, mild stenosis +----------+--------+-----+---------------+---------+---------------------+ SFA Distal77                          triphasic                      +----------+--------+-----+---------------+---------+---------------------+ POP Prox  84                          biphasic plaque                +----------+--------+-----+---------------+---------+---------------------+ POP Distal66                          biphasic plaque                +----------+--------+-----+---------------+---------+---------------------+ TP Trunk  65                           biphasic                       +----------+--------+-----+---------------+---------+---------------------+ A focal velocity elevation of 181 cm/s was obtained at common femoral artery with a VR of 1.2. Findings are characteristic of 30-49% stenosis. A 2nd focal velocity  elevation was visualized, measuring 154 cm/s at mid SFA with post stenotic turbulence with  a VR of 1.34. Findings are characteristic of 30-49% stenosis.  Summary: Right: Limited evaluation of the right common femoral artery access site revealed a non-vascular hematoma measuring 2.8 x 1.1 cm. >50% stenosis of the right common femoral artery with difficult insonation due to calcific plaque. Left: 30-49% stenosis noted in the common femoral artery. 30-49% stenosis noted in the mid superficial femoral artery. Patent proximal SFA, s/p atherectomy and angioplasty.  See table(s) above for measurements and observations. Suggest follow up study in 6 months.    Preliminary     Procedures Procedures (including critical care time)  Medications Ordered in ED Medications - No data to display  ED Course  I have reviewed the triage vital signs and the nursing notes.  Pertinent labs & imaging results that were available during my care of the patient were reviewed by me and considered in my medical decision making (see chart for details).    MDM Rules/Calculators/A&P                     Consult: Had reviewed with Dr. Tawni Millers who is in the OR.  Advised to proceed with arterial duplex.  Duplex without acute complications identified.  Bruising consistent with resolving hematoma tracking with gravity.  Patient's neurovascular exam is intact.  She is otherwise alert and well in appearance.  Advised for follow-up ASAP with Dr. Fletcher Anon for recheck.  Return precautions reviewed. Final Clinical Impression(s) / ED Diagnoses Final diagnoses:  Postoperative pain  Hematoma    Rx / DC Orders ED Discharge Orders    None       Charlesetta Shanks,  MD 05/22/19 1636

## 2019-05-17 NOTE — ED Triage Notes (Signed)
Pt reports surgical procedure on 3/24 to have a "medicated balloon" placed in her L leg, she states they went in through her R leg to do the procedure on her L leg. She reports bruising to her R leg since the procedure. States her PCP has seen it and was concerned for internal bleeding in her leg. States her surgeon advised her to come to ED for further eval.

## 2019-05-21 ENCOUNTER — Other Ambulatory Visit: Payer: Self-pay

## 2019-05-21 ENCOUNTER — Other Ambulatory Visit (HOSPITAL_COMMUNITY): Payer: Self-pay | Admitting: Cardiovascular Disease

## 2019-05-21 ENCOUNTER — Telehealth: Payer: Self-pay

## 2019-05-21 ENCOUNTER — Ambulatory Visit (HOSPITAL_COMMUNITY)
Admission: RE | Admit: 2019-05-21 | Discharge: 2019-05-21 | Disposition: A | Discharge status: Home / Self Care | Payer: 59 | Source: Ambulatory Visit | Attending: Cardiology | Admitting: Cardiology

## 2019-05-21 DIAGNOSIS — I739 Peripheral vascular disease, unspecified: Secondary | ICD-10-CM | POA: Insufficient documentation

## 2019-05-21 DIAGNOSIS — Z95820 Peripheral vascular angioplasty status with implants and grafts: Secondary | ICD-10-CM | POA: Diagnosis not present

## 2019-05-21 NOTE — Telephone Encounter (Signed)
Returned pt's call to try to reach her to set up surgery. Have tried several times to call her and left vm's. Will retry her later today.

## 2019-05-28 ENCOUNTER — Ambulatory Visit (INDEPENDENT_AMBULATORY_CARE_PROVIDER_SITE_OTHER): Payer: 59 | Admitting: Vascular Surgery

## 2019-05-28 ENCOUNTER — Other Ambulatory Visit: Payer: Self-pay

## 2019-05-28 ENCOUNTER — Encounter: Payer: Self-pay | Admitting: Cardiovascular Disease

## 2019-05-28 ENCOUNTER — Encounter: Payer: Self-pay | Admitting: Vascular Surgery

## 2019-05-28 ENCOUNTER — Ambulatory Visit (INDEPENDENT_AMBULATORY_CARE_PROVIDER_SITE_OTHER): Payer: 59 | Admitting: Cardiovascular Disease

## 2019-05-28 VITALS — BP 120/70 | HR 84 | Ht 62.5 in | Wt 152.0 lb

## 2019-05-28 VITALS — BP 120/70 | HR 84 | Temp 97.7°F | Ht 63.0 in | Wt 154.8 lb

## 2019-05-28 DIAGNOSIS — I739 Peripheral vascular disease, unspecified: Secondary | ICD-10-CM

## 2019-05-28 DIAGNOSIS — E785 Hyperlipidemia, unspecified: Secondary | ICD-10-CM

## 2019-05-28 DIAGNOSIS — I2510 Atherosclerotic heart disease of native coronary artery without angina pectoris: Secondary | ICD-10-CM | POA: Diagnosis not present

## 2019-05-28 NOTE — Progress Notes (Signed)
Cardiology Office Note   Date:  05/28/2019   ID:  [name], DOB 1955/10/03, MRN [id number]  PCP:  Susy Frizzle, MD  Cardiologist:  Stark Klein chief complaint on file.     History of Present Illness: [name] is a 64 y.o. female who presents for a follow up visit regarding regarding peripheral arterial disease and coronary artery disease. She has known history of peripheral arterial disease status post atherectomy of the right SFA in 2011 by Dr. Julianne Handler. She has other chronic medical conditions including CAD, DM, HTN, and hyperlipidemia. She is a former smoker.  She is s/p bilateral TKR.   She had worsening right leg claudication in April 2017. Angiography showed no significant aortoiliac disease. There was moderate right common femoral artery stenosis and severe discrete stenosis in the proximal right SFA with three-vessel runoff below the knee. There was no significant obstructive disease involving the left lower extremity. A self-expanding stent was placed to the proximal right SFA. She had previous bilateral knee replacement and chronic bilateral leg pain.   She reestablished follow-up with me recently.  She was hospitalized in August pancreatitis.  She is labeled as chronic pancreatitis due to alcohol use but she denies recent alcohol use.  She had recent worsening of left calf claudication.  Vascular studies showed mildly reduced ABI bilaterally in the 0.8 range.  Duplex showed moderate right common femoral artery stenosis and moderate SFA disease.  On the left, there was severe new stenosis in the proximal SFA. I proceeded with angiography last month which showed borderline stenosis affecting the left common iliac artery, severe calcified stenosis affecting the left proximal SFA and three-vessel runoff below the knee.  On the right, there was severe calcified disease affecting the common femoral artery with patent proximal SFA stent followed by 60% disease throughout  the whole SFA and three-vessel runoff below the knee.  I performed successful orbital atherectomy and drug-coated balloon angioplasty to the left SFA.  I recommended right common femoral artery endarterectomy and the patient was seen by Dr. Carlis Abbott. Unfortunately, after sheath pull, patient developed significant bruising and small hematoma.  The bruising extended all the way down to below the knee but has gradually improved since then.  She underwent noninvasive vascular studies which showed normalization of left ABI with patent left SFA.  There was small hematoma in the right groin with no evidence of pseudoaneurysm.    Past Medical History:  Diagnosis Date  . Asthma with COPD ([place])    PATIENT DENIES  . CAD (coronary artery disease)    a. [place] 2011: Diffuse distal and branch vessel disease - patient managed medically, no interventional options. b. Nuc 03/2014 - low risk, no ischemia.  . Complication of anesthesia    slow to wake up with last surgery in 2011   . Diabetes mellitus with circulatory complication ([place])   . Dyslipidemia   . Headache    hx of migraines   . Hypertension   . Osteoarthritis    severe right knee  R TKR  . PAD (peripheral artery disease) ([place])    a. Severe stenosis mid right SFA s/p atherectomy 03/25/09. b. peripheral angiography in 12/2011 which showed only about 50% diffuse right SFA stenosis.  . Pancreatitis 2003  . Renal artery stenosis ([place])    a. [place] 2011 - 40-50% left RAS.  . Tobacco use disorder    quit 11/10    Past Surgical History:  Procedure Laterality Date  .  ABDOMINAL AORTAGRAM N/A 12/21/2011   Procedure: ABDOMINAL Maxcine Ham;  Surgeon: Wellington Hampshire, MD;  Location: [facility];  Service: Cardiovascular;  Laterality: N/A;  . ABDOMINAL AORTOGRAM W/LOWER EXTREMITY Bilateral 05/08/2019   Procedure: ABDOMINAL AORTOGRAM W/LOWER EXTREMITY;  Surgeon: Wellington Hampshire, MD;  Location: [facility];  Service: Cardiovascular;  Laterality: Bilateral;  .  CARDIAC CATHETERIZATION    . ESOPHAGOGASTRODUODENOSCOPY (EGD) WITH PROPOFOL N/A 10/05/2017   Procedure: ESOPHAGOGASTRODUODENOSCOPY (EGD) WITH PROPOFOL;  Surgeon: Milus Banister, MD;  [facility];  Service: Endoscopy;  Laterality: N/A;  . EUS N/A 10/05/2017   Procedure: UPPER ENDOSCOPIC ULTRASOUND (EUS) RADIAL;  Surgeon: Milus Banister, MD;  [facility];  Service: Endoscopy;  Laterality: N/A;  . FOOT SURGERY Left   . LOWER EXTREMITY ANGIOGRAM Bilateral 05/27/2015   Procedure: Lower Extremity Angiogram;  Surgeon: Wellington Hampshire, MD;  Location: [facility];  Service: Cardiovascular;  Laterality: Bilateral;  . PERIPHERAL VASCULAR ATHERECTOMY Left 05/08/2019   Procedure: PERIPHERAL VASCULAR ATHERECTOMY;  Surgeon: Wellington Hampshire, MD;  Location: [place] [facility];  Service: Cardiovascular;  Laterality: Left;  . PERIPHERAL VASCULAR CATHETERIZATION N/A 05/27/2015   Procedure: Abdominal Aortogram;  Surgeon: Wellington Hampshire, MD;  Location: [place] [facility];  Service: Cardiovascular;  Laterality: N/A;  . PERIPHERAL VASCULAR CATHETERIZATION Right 05/27/2015   Procedure: Peripheral Vascular Intervention;  Surgeon: Wellington Hampshire, MD;  Location: [facility];  Service: Cardiovascular;  Laterality: Right;  SFA  . TOTAL KNEE ARTHROPLASTY Right 2011  . TOTAL KNEE ARTHROPLASTY Left 05/22/2014   Procedure: LEFT TOTAL KNEE ARTHROPLASTY;  Surgeon: Latanya Maudlin, MD;  [facility];  Service: Orthopedics;  Laterality: Left;  . TUBAL LIGATION  1980     Current Outpatient Medications  Medication Sig Dispense Refill  . acetaminophen (TYLENOL) 500 MG tablet Take 1,000 mg by mouth every 6 (six) hours as needed for moderate pain or headache.    . ADVAIR DISKUS 250-50 MCG/DOSE AEPB INHALE 1 PUFF BY MOUTH INTO THE LUNGS EVERY 12 HOURS (Patient taking differently: Inhale 1 puff into the lungs 2 (two) times daily. ) 60 each 5  . aspirin 81 MG tablet Take 81 mg by mouth daily.       . Biotin w/ Vitamins C & E (HAIR/SKIN/NAILS PO) Take 1 tablet by mouth daily.    . bisoprolol (ZEBETA) 5 MG tablet TAKE ONE-HALF TABLET BY MOUTH EVERY DAY (Patient taking differently: Take 2.5 mg by mouth at bedtime. ) 90 tablet 3  . Blood Glucose Monitoring Suppl (BAYER CONTOUR MONITOR) w/Device KIT CHECK BLOOD SUGAR EVERY DAY 1 kit 0  . Blood Glucose Monitoring Suppl (ONE TOUCH ULTRA 2) w/Device KIT Check BS daily 1 kit 0  . clopidogrel (PLAVIX) 75 MG tablet Take 1 tablet (75 mg total) by mouth daily. 30 tablet 11  . CONTOUR NEXT TEST test strip CHECK BLOOD SUGAR DAILY AS DIRECTED 100 strip 3  . CREON 36000 units CPEP capsule TAKE 2 CAPSULES BY MOUTH THREE TIMES DAILY BEFORE MEALS (Patient taking differently: Take 108,000 Units by mouth 3 (three) times daily before meals. ) 180 capsule 0  . diclofenac sodium (VOLTAREN) 1 % GEL Apply 2 g topically 4 (four) times daily. (Patient taking differently: Apply 2 g topically 4 (four) times daily as needed (for hand pain). ) 100 g 0  . glucose blood (ONETOUCH ULTRA) test strip Check BS daily  DX: E11.9 100 each 12  . HYDROcodone-acetaminophen (NORCO/VICODIN) 5-325 MG  tablet Take 1 tablet by mouth every 6 (six) hours as needed for moderate pain. Pain Management, [place] 130 tablet 0  . isosorbide mononitrate (IMDUR) 60 MG 24 hr tablet TAKE 1 TABLET BY MOUTH DAILY (Patient taking differently: Take 60 mg by mouth daily. ) 90 tablet 2  . Lancet Devices (ONE TOUCH DELICA LANCING DEV) MISC Use daily DxE11.59 100 each 3  . Lancets (ONETOUCH DELICA PLUS [id number]) MISC 1 Units by Other route daily. 100 each 3  . metFORMIN (GLUCOPHAGE) 1000 MG tablet TAKE 1 TABLET BY MOUTH TWICE DAILY WITH MEALS (Patient taking differently: Take 1,000 mg by mouth in the morning and at bedtime. ) 180 tablet 0  . methocarbamol (ROBAXIN) 500 MG tablet TAKE 1 TABLET BY MOUTH EVERY 6 HOURS AS NEEDED FOR MUSCLE SPASMS (Patient taking differently: Take 500 mg by mouth every 6 (six)  hours as needed for muscle spasms. ) 120 tablet 2  . Multiple Vitamin (MULTIVITAMIN) tablet Take 1 tablet by mouth daily.      . nitroGLYCERIN (NITROSTAT) 0.4 MG SL tablet Place 1 tablet under the tongue every 5 (five) minutes as needed for chest pain.    Marland Kitchen ondansetron (ZOFRAN) 4 MG tablet TAKE 1 TABLET BY MOUTH EVERY 8 HOURS AS NEEDED FOR NAUSEA OR VOMITING (Patient taking differently: Take 4 mg by mouth daily. ) 30 tablet 2  . pantoprazole (PROTONIX) 40 MG tablet TAKE 1 TABLET BY MOUTH DAILY (Patient taking differently: Take 40 mg by mouth daily. ) 90 tablet 3  . pioglitazone (ACTOS) 30 MG tablet Take 1 tablet (30 mg total) by mouth daily. 90 tablet 3  . Probiotic Product (TRUBIOTICS PO) Take 1 capsule by mouth daily.    . rosuvastatin (CRESTOR) 40 MG tablet Take 1 tablet (40 mg total) by mouth at bedtime. 90 tablet 3  . VENTOLIN HFA 108 (90 Base) MCG/ACT inhaler INHALE 2 PUFFS BY MOUTH TWICE DAILY AS NEEDED FOR WHEEZING OR SHORTNESS OF BREATH (Patient taking differently: Inhale 1 puff into the lungs 2 (two) times daily as needed for wheezing or shortness of breath. ) 18 g 3   No current facility-administered medications for this visit.    Allergies:   Gabapentin, Glucotrol [glipizide], Influenza vaccines, Latex, Nortriptyline, Tetanus toxoid, and Invokana [canagliflozin]    Social History:  The patient  reports that she quit smoking about 10 years ago. She has never used smokeless tobacco. She reports that she does not drink alcohol or use drugs.   Family History:  The patient's family history includes Breast cancer (age of onset: 33) in her sister; CVA in her mother; Emphysema in her father.    ROS:  Please see the history of present illness.   Otherwise, review of systems are positive for .   All other systems are reviewed and negative.    PHYSICAL EXAM: VS:  BP 120/70   Pulse 84   Temp 97.7 F (36.5 C)   Ht 5' 3"  (1.6 m)   Wt 154 lb 12.8 oz (70.2 kg)   SpO2 100%   BMI 27.42  kg/m  , BMI Body mass index is 27.42 kg/m. GEN: Well nourished, well developed, in no acute distress  HEENT: normal  Neck: no JVD, carotid bruits, or masses Cardiac: RRR; no murmurs, rubs, or gallops,no edema  Respiratory:  clear to auscultation bilaterally, normal work of breathing GI: soft, nontender, nondistended, + BS MS: no deformity or atrophy  Skin: warm and dry, no rash Neuro:  Strength and sensation are  intact Psych: euthymic mood, full affect Vascular :  Femoral pulse is mildly diminished bilaterally.  Small right groin hematoma.  EKG:  EKG is not ordered today.     Recent Labs: 02/18/2019: ALT 17 05/03/2019: BUN 21; Creatinine, Ser 1.03; Hemoglobin 12.5; Platelets 317; Potassium 4.7; Sodium 137    Lipid Panel    Component Value Date/Time   CHOL 142 02/18/2019 1121   TRIG 226 (H) 02/18/2019 1121   HDL 46 (L) 02/18/2019 1121   CHOLHDL 3.1 02/18/2019 1121   VLDL 35 07/16/2017 0345   LDLCALC 68 02/18/2019 1121   LDLDIRECT 50.6 01/02/2014 1003      Wt Readings from Last 3 Encounters:  05/28/19 154 lb 12.8 oz (70.2 kg)  05/17/19 151 lb (68.5 kg)  05/14/19 156 lb (70.8 kg)       ASSESSMENT AND PLAN:  1.  PAD with claudication: Status post recent left SFA atherectomy and drug-coated balloon angioplasty.  She is on dual antiplatelet therapy.  She has severe right common femoral artery stenosis which is calcified.  I have recommended endarterectomy for this and the patient was seen by Dr. Carlis Abbott.  She wants to wait on scheduling the surgery for at least a month to take care of some personal issues.  I think it is also reasonable given that she still has small hematoma in the right groin.  She has a virtual appointment with Dr. Carlis Abbott later today.   2. Coronary artery disease involving native coronary arteries without angina:  Previous cardiac catheterization in 2011 showed distal LAD and small branch disease which is being managed medically.  She had worsening chest pain  last year but she reports improvement in symptoms since then.  Continue medical therapy.  3. Hyperlipidemia:  Continue treatment with high-dose rosuvastatin.  Recommend a target LDL of less than 70.  Recent lipid profile showed an LDL of 68.  4. Diabetes mellitus: Managed by primary care physician.    Disposition:   FU with me in 6 months  Signed,  Kathlyn Sacramento, MD  05/28/2019 10:08 AM    [place]

## 2019-05-28 NOTE — Progress Notes (Signed)
Virtual Visit via Telephone Note  Referring MD: Dr. Fletcher Anon  I connected with [name] on 05/28/2019 using the [email or web address] by telephone and verified that I was speaking with the correct person using two identifiers.    The limitations of evaluation and management by telemedicine and the availability of in person appointments have been previously discussed with the patient and are documented in the patients chart. The patient expressed understanding and consented to proceed.  PCP: Susy Frizzle, MD  Chief Complaint: Discuss right femoral endarterectomy  History of Present Illness: [name] is a 64 y.o. female with history of hypertension, hyperlipidemia, PAD, diabetes, coronary artery disease, pancreatities that vascular surgery has been consulted for evaluation of right femoral endarterectomy.  Patient has been under the care of Dr. Fletcher Anon and underwent left SFA atherectomy for a focal lesion and was found to have high-grade right common femoral stenosis and vascular surgery was consulted.  She reported years of lower extremity claudication in both legs.  She states she can hardly walk for her legs starting to hurt and quantifies her walking distance at less than 50 feet.  She denies active tobacco abuse, but previously smoked.  Other surgery in the right lower extremity is a proximal right SFA stent by Dr. Fletcher Anon in 2017.  She reports no other open surgery or groin surgery on the right leg.  Taking aspirin and plavix.    Past Medical History:  Diagnosis Date  . Asthma with COPD ([place])    PATIENT DENIES  . CAD (coronary artery disease)    a. [place] 2011: Diffuse distal and branch vessel disease - patient managed medically, no interventional options. b. Nuc 03/2014 - low risk, no ischemia.  . Complication of anesthesia    slow to wake up with last surgery in 2011   . Diabetes mellitus with circulatory complication ([place])   . Dyslipidemia   . Headache    hx of migraines   .  Hypertension   . Osteoarthritis    severe right knee  R TKR  . PAD (peripheral artery disease) ([place])    a. Severe stenosis mid right SFA s/p atherectomy 03/25/09. b. peripheral angiography in 12/2011 which showed only about 50% diffuse right SFA stenosis.  . Pancreatitis 2003  . Renal artery stenosis ([place])    a. [place] 2011 - 40-50% left RAS.  . Tobacco use disorder    quit 11/10    Past Surgical History:  Procedure Laterality Date  . ABDOMINAL AORTAGRAM N/A 12/21/2011   Procedure: ABDOMINAL Maxcine Ham;  Surgeon: Wellington Hampshire, MD;  Location: [facility];  Service: Cardiovascular;  Laterality: N/A;  . ABDOMINAL AORTOGRAM W/LOWER EXTREMITY Bilateral 05/08/2019   Procedure: ABDOMINAL AORTOGRAM W/LOWER EXTREMITY;  Surgeon: Wellington Hampshire, MD;  Location: [facility];  Service: Cardiovascular;  Laterality: Bilateral;  . CARDIAC CATHETERIZATION    . ESOPHAGOGASTRODUODENOSCOPY (EGD) WITH PROPOFOL N/A 10/05/2017   Procedure: ESOPHAGOGASTRODUODENOSCOPY (EGD) WITH PROPOFOL;  Surgeon: Milus Banister, MD;  [facility];  Service: Endoscopy;  Laterality: N/A;  . EUS N/A 10/05/2017   Procedure: UPPER ENDOSCOPIC ULTRASOUND (EUS) RADIAL;  Surgeon: Milus Banister, MD;  [facility];  Service: Endoscopy;  Laterality: N/A;  . FOOT SURGERY Left   . LOWER EXTREMITY ANGIOGRAM Bilateral 05/27/2015   Procedure: Lower Extremity Angiogram;  Surgeon: Wellington Hampshire, MD;  Location: [facility];  Service: Cardiovascular;  Laterality: Bilateral;  . PERIPHERAL VASCULAR ATHERECTOMY Left 05/08/2019   Procedure: PERIPHERAL  VASCULAR ATHERECTOMY;  Surgeon: Wellington Hampshire, MD;  Location: [facility];  Service: Cardiovascular;  Laterality: Left;  . PERIPHERAL VASCULAR CATHETERIZATION N/A 05/27/2015   Procedure: Abdominal Aortogram;  Surgeon: Wellington Hampshire, MD;  Location: [facility];  Service: Cardiovascular;  Laterality: N/A;  . PERIPHERAL VASCULAR CATHETERIZATION Right  05/27/2015   Procedure: Peripheral Vascular Intervention;  Surgeon: Wellington Hampshire, MD;  Location: [facility] [facility] [facility];  Service: Cardiovascular;  Laterality: Right;  SFA  . TOTAL KNEE ARTHROPLASTY Right 2011  . TOTAL KNEE ARTHROPLASTY Left 05/22/2014   Procedure: LEFT TOTAL KNEE ARTHROPLASTY;  Surgeon: Latanya Maudlin, MD;  [facility];  Service: Orthopedics;  Laterality: Left;  . TUBAL LIGATION  1980    Current Meds  Medication Sig  . acetaminophen (TYLENOL) 500 MG tablet Take 1,000 mg by mouth every 6 (six) hours as needed for moderate pain or headache.  . ADVAIR DISKUS 250-50 MCG/DOSE AEPB INHALE 1 PUFF BY MOUTH INTO THE LUNGS EVERY 12 HOURS (Patient taking differently: Inhale 1 puff into the lungs 2 (two) times daily. )  . aspirin 81 MG tablet Take 81 mg by mouth daily.    . Biotin w/ Vitamins C & E (HAIR/SKIN/NAILS PO) Take 1 tablet by mouth daily.  . bisoprolol (ZEBETA) 5 MG tablet TAKE ONE-HALF TABLET BY MOUTH EVERY DAY (Patient taking differently: Take 2.5 mg by mouth at bedtime. )  . Blood Glucose Monitoring Suppl (BAYER CONTOUR MONITOR) w/Device KIT CHECK BLOOD SUGAR EVERY DAY  . Blood Glucose Monitoring Suppl (ONE TOUCH ULTRA 2) w/Device KIT Check BS daily  . clopidogrel (PLAVIX) 75 MG tablet Take 1 tablet (75 mg total) by mouth daily.  . CONTOUR NEXT TEST test strip CHECK BLOOD SUGAR DAILY AS DIRECTED  . CREON 36000 units CPEP capsule TAKE 2 CAPSULES BY MOUTH THREE TIMES DAILY BEFORE MEALS (Patient taking differently: Take 108,000 Units by mouth 3 (three) times daily before meals. )  . diclofenac sodium (VOLTAREN) 1 % GEL Apply 2 g topically 4 (four) times daily. (Patient taking differently: Apply 2 g topically 4 (four) times daily as needed (for hand pain). )  . glucose blood (ONETOUCH ULTRA) test strip Check BS daily  DX: E11.9  . HYDROcodone-acetaminophen (NORCO/VICODIN) 5-325 MG tablet Take 1 tablet by mouth every 6 (six) hours as needed for moderate pain. Pain Management,  [place]  . isosorbide mononitrate (IMDUR) 60 MG 24 hr tablet TAKE 1 TABLET BY MOUTH DAILY (Patient taking differently: Take 60 mg by mouth daily. )  . Lancet Devices (ONE TOUCH DELICA LANCING DEV) MISC Use daily DxE11.59  . Lancets (ONETOUCH DELICA PLUS [id number]) MISC 1 Units by Other route daily.  . metFORMIN (GLUCOPHAGE) 1000 MG tablet TAKE 1 TABLET BY MOUTH TWICE DAILY WITH MEALS (Patient taking differently: Take 1,000 mg by mouth in the morning and at bedtime. )  . methocarbamol (ROBAXIN) 500 MG tablet TAKE 1 TABLET BY MOUTH EVERY 6 HOURS AS NEEDED FOR MUSCLE SPASMS (Patient taking differently: Take 500 mg by mouth every 6 (six) hours as needed for muscle spasms. )  . Multiple Vitamin (MULTIVITAMIN) tablet Take 1 tablet by mouth daily.    . nitroGLYCERIN (NITROSTAT) 0.4 MG SL tablet Place 1 tablet under the tongue every 5 (five) minutes as needed for chest pain.  Marland Kitchen ondansetron (ZOFRAN) 4 MG tablet TAKE 1 TABLET BY MOUTH EVERY 8 HOURS AS NEEDED FOR NAUSEA OR VOMITING (Patient taking differently: Take 4 mg by mouth daily. )  .  pantoprazole (PROTONIX) 40 MG tablet TAKE 1 TABLET BY MOUTH DAILY (Patient taking differently: Take 40 mg by mouth daily. )  . pioglitazone (ACTOS) 30 MG tablet Take 1 tablet (30 mg total) by mouth daily.  . Probiotic Product (TRUBIOTICS PO) Take 1 capsule by mouth daily.  . rosuvastatin (CRESTOR) 40 MG tablet Take 1 tablet (40 mg total) by mouth at bedtime.  . VENTOLIN HFA 108 (90 Base) MCG/ACT inhaler INHALE 2 PUFFS BY MOUTH TWICE DAILY AS NEEDED FOR WHEEZING OR SHORTNESS OF BREATH (Patient taking differently: Inhale 1 puff into the lungs 2 (two) times daily as needed for wheezing or shortness of breath. )       Observations/Objective:   reviewed patient's arteriogram again that shows a high-grade right common femoral artery stenosis with heavy calcification.  SFA remains diffusely calcified and diseased throughout its course.  Appears to have three-vessel runoff  in the right lower extremity  Assessment and Plan:  64 y.o. female that presents with lifestyle limiting short distance claudication of the right lower extremity likely due to high-grade calcified right common femoral artery stenosis.  Ultimately she would like to delay surgery for a month or 2 given a hematoma in her right groin following percutaneous access by Dr. Fletcher Anon for left lower extremity intervention.  I think this is very reasonable.  She wants to arrange follow-up with me in the interim to ensure her groin is healing before deciding to proceed with surgery.  Will arrange follow-up in 4 to 6 weeks.  Follow Up Instructions:   Follow up: 4-6 weeks   I discussed the assessment and treatment plan with the patient. The patient was provided an opportunity to ask questions and all were answered. The patient agreed with the plan and demonstrated an understanding of the instructions.   The patient was advised to call back or seek an in-person evaluation if the symptoms worsen or if the condition fails to improve as anticipated.  I spent 10 minutes with the patient via telephone encounter.   Signed, Marty Heck [facility] Office: [phone]  05/28/2019, 4:30 PM

## 2019-05-28 NOTE — Patient Instructions (Signed)

## 2019-06-03 ENCOUNTER — Other Ambulatory Visit: Payer: Self-pay | Admitting: Family Medicine

## 2019-06-03 MED ORDER — HYDROCODONE-ACETAMINOPHEN 5-325 MG PO TABS: 1.0000 | ORAL_TABLET | Freq: Four times a day (QID) | ORAL | 0 refills | Status: DC | PRN

## 2019-06-03 NOTE — Telephone Encounter (Signed)
Patient is calling to get refill on hydrocodone  [facility] [place]

## 2019-06-03 NOTE — Telephone Encounter (Signed)
Patient is requesting a refill on Hydrocodone   LOV: 05/14/2019  LRF: 04/29/2019

## 2019-06-11 ENCOUNTER — Other Ambulatory Visit: Payer: Self-pay | Admitting: Family Medicine

## 2019-06-27 ENCOUNTER — Other Ambulatory Visit: Payer: Self-pay | Admitting: Family Medicine

## 2019-06-27 MED ORDER — HYDROCODONE-ACETAMINOPHEN 5-325 MG PO TABS: 1.0000 | ORAL_TABLET | Freq: Four times a day (QID) | ORAL | 0 refills | Status: DC | PRN

## 2019-06-27 NOTE — Telephone Encounter (Signed)
Patient is requesting a refill on Hydrocodone   LOV:  05/14/2019  LRF:  06/03/2019

## 2019-06-27 NOTE — Telephone Encounter (Signed)
CB # [phone] Refill on Hydrocodone

## 2019-07-23 ENCOUNTER — Other Ambulatory Visit: Payer: Self-pay | Admitting: Family Medicine

## 2019-07-23 MED ORDER — HYDROCODONE-ACETAMINOPHEN 5-325 MG PO TABS: 1.0000 | ORAL_TABLET | Freq: Four times a day (QID) | ORAL | 0 refills | Status: DC | PRN

## 2019-07-23 NOTE — Telephone Encounter (Signed)
CB# [phone] Refill Hydrocodone  

## 2019-07-23 NOTE — Telephone Encounter (Signed)
K.[place] MD refilled 

## 2019-07-23 NOTE — Telephone Encounter (Signed)
Sent with K. [facility] MD

## 2019-07-23 NOTE — Telephone Encounter (Signed)
Last Refill 06-27-19

## 2019-07-29 ENCOUNTER — Telehealth: Payer: Self-pay | Admitting: Family Medicine

## 2019-07-29 ENCOUNTER — Other Ambulatory Visit: Payer: Self-pay

## 2019-07-29 NOTE — Telephone Encounter (Signed)
Rx refilled.

## 2019-07-29 NOTE — Telephone Encounter (Signed)
Husband requesting refill on hydrocodone  CB# [phone]

## 2019-07-29 NOTE — Telephone Encounter (Signed)
Routed to Dr. Pickard 

## 2019-08-15 ENCOUNTER — Telehealth: Payer: Self-pay

## 2019-08-15 NOTE — Telephone Encounter (Signed)
Pt called reporting numbness and pain in Rt calf and foot with walking but worse at night. Nothing really makes it better. Denies any discoloration, temperature changes or open sores. Pt discussed possible right femoral endarterectomy with Dr. Chestine Spore on 05/28/19 and is interested in Dr. Burna Forts recommendations- office follow up or proceed with surgery scheduling. If proceeding with scheduling, she stated she will contact us when ready to schedule due to unexpected family health concerns.

## 2019-09-02 ENCOUNTER — Other Ambulatory Visit: Payer: Self-pay | Admitting: Family Medicine

## 2019-09-02 NOTE — Telephone Encounter (Signed)
CB# [phone] Refill Hydrocodone

## 2019-09-03 MED ORDER — HYDROCODONE-ACETAMINOPHEN 5-325 MG PO TABS: 1.0000 | ORAL_TABLET | Freq: Four times a day (QID) | ORAL | 0 refills | Status: DC | PRN

## 2019-09-03 NOTE — Telephone Encounter (Signed)
Ok to refill??  Last office visit 05/14/2019.  Last refill 07/23/2019.

## 2019-09-10 ENCOUNTER — Ambulatory Visit (INDEPENDENT_AMBULATORY_CARE_PROVIDER_SITE_OTHER): Payer: 59 | Admitting: Vascular Surgery

## 2019-09-10 ENCOUNTER — Encounter: Payer: Self-pay | Admitting: Vascular Surgery

## 2019-09-10 ENCOUNTER — Other Ambulatory Visit: Payer: Self-pay

## 2019-09-10 VITALS — BP 140/77 | HR 87 | Temp 97.5°F | Resp 16 | Ht 62.5 in | Wt 147.0 lb

## 2019-09-10 DIAGNOSIS — I739 Peripheral vascular disease, unspecified: Secondary | ICD-10-CM

## 2019-09-10 NOTE — Progress Notes (Signed)
Patient name: [name] MRN: [id number] DOB: 07-31-1955 Sex: female  REASON FOR VISIT: Evaluate for right femoral endarterectomy  HPI: [name] is a 64 y.o. female with history of hypertension, hyperlipidemia, PAD, diabetes, coronary artery disease, pancreatitiesthat presents to further discuss right femoral endarterectomy.  Vascular surgeryhasinitially consulted for evaluation of right femoral endarterectomy in the hospital when she was under the care of Dr. Patton Salles underwent left SFA atherectomy for a focal lesionand was found to have high-grade right common femoral stenosis.  She reports years of lower extremity claudication in both legs.  Left leg is somewhat improved since recent left SFA intervention.  Her right leg is more bothersome.  Still is still having right calf claudication over short distances. She denies active tobacco abuse, but previously smoked. She was waiting for her right groin hematoma to resolve and this has since improved (after right transfemoral access for left leg intervention).   Past Medical History:  Diagnosis Date  . Asthma with COPD ([place])    PATIENT DENIES  . CAD (coronary artery disease)    a. [place] 2011: Diffuse distal and branch vessel disease - patient managed medically, no interventional options. b. [place] 03/2014 - low risk, no ischemia.  . Complication of anesthesia    slow to wake up with last surgery in 2011   . Diabetes mellitus with circulatory complication ([place])   . Dyslipidemia   . Headache    hx of migraines   . Hypertension   . Osteoarthritis    severe right knee  R TKR  . PAD (peripheral artery disease) ([place])    a. Severe stenosis mid right SFA s/p atherectomy 03/25/09. b. peripheral angiography in 12/2011 which showed only about 50% diffuse right SFA stenosis.  . Pancreatitis 2003  . Renal artery stenosis ([place])    a. [place] 2011 - 40-50% left RAS.  . Tobacco use disorder    quit 11/10    Past Surgical History:  Procedure  Laterality Date  . ABDOMINAL AORTAGRAM N/A 12/21/2011   Procedure: ABDOMINAL Maxcine Ham;  Surgeon: Wellington Hampshire, MD;  Location: [facility];  Service: Cardiovascular;  Laterality: N/A;  . ABDOMINAL AORTOGRAM W/LOWER EXTREMITY Bilateral 05/08/2019   Procedure: ABDOMINAL AORTOGRAM W/LOWER EXTREMITY;  Surgeon: Wellington Hampshire, MD;  Location: [facility];  Service: Cardiovascular;  Laterality: Bilateral;  . CARDIAC CATHETERIZATION    . ESOPHAGOGASTRODUODENOSCOPY (EGD) WITH PROPOFOL N/A 10/05/2017   Procedure: ESOPHAGOGASTRODUODENOSCOPY (EGD) WITH PROPOFOL;  Surgeon: Milus Banister, MD;  Location: [facility];  Service: Endoscopy;  Laterality: N/A;  . EUS N/A 10/05/2017   Procedure: UPPER ENDOSCOPIC ULTRASOUND (EUS) RADIAL;  Surgeon: Milus Banister, MD;  Location: [facility];  Service: Endoscopy;  Laterality: N/A;  . FOOT SURGERY Left   . LOWER EXTREMITY ANGIOGRAM Bilateral 05/27/2015   Procedure: Lower Extremity Angiogram;  Surgeon: Wellington Hampshire, MD;  Location: [facility];  Service: Cardiovascular;  Laterality: Bilateral;  . PERIPHERAL VASCULAR ATHERECTOMY Left 05/08/2019   Procedure: PERIPHERAL VASCULAR ATHERECTOMY;  Surgeon: Wellington Hampshire, MD;  Location: [facility];  Service: Cardiovascular;  Laterality: Left;  . PERIPHERAL VASCULAR CATHETERIZATION N/A 05/27/2015   Procedure: Abdominal Aortogram;  Surgeon: Wellington Hampshire, MD;  Location: [facility];  Service: Cardiovascular;  Laterality: N/A;  . PERIPHERAL VASCULAR CATHETERIZATION Right 05/27/2015   Procedure: Peripheral Vascular Intervention;  Surgeon: Wellington Hampshire, MD;  Location: [facility];  Service: Cardiovascular;  Laterality: Right;  SFA  .  TOTAL KNEE ARTHROPLASTY Right 2011  . TOTAL KNEE ARTHROPLASTY Left 05/22/2014   Procedure: LEFT TOTAL KNEE ARTHROPLASTY;  Surgeon: Latanya Maudlin, MD;  Location: [facility];  Service: Orthopedics;  Laterality: Left;  . TUBAL LIGATION  1980    Family  History  Problem Relation Age of Onset  . CVA Mother   . Emphysema Father   . Breast cancer Sister 48    SOCIAL HISTORY: Social History   Tobacco Use  . Smoking status: Former Smoker    Quit date: 12/13/2008    Years since quitting: 10.7  . Smokeless tobacco: Never Used  Substance Use Topics  . Alcohol use: No    Comment: Quit drinking around year 2000    Allergies  Allergen Reactions  . Gabapentin Other (See Comments)    Brain shakes  . Glucotrol [Glipizide] Rash and Other (See Comments)    Red rash  . Influenza Vaccines Other (See Comments)    Significant arm soreness requiring 1 year of physical therapy  . Latex Rash  . Nortriptyline Other (See Comments)    BRAIN SHAKE  . Tetanus Toxoid Hives, Swelling, Rash and Other (See Comments)    Swelling to site of injection with rash and fever  . Invokana [Canagliflozin] Other (See Comments)    Yeast Infections    Current Outpatient Medications  Medication Sig Dispense Refill  . acetaminophen (TYLENOL) 500 MG tablet Take 1,000 mg by mouth every 6 (six) hours as needed for moderate pain or headache.    . ADVAIR DISKUS 250-50 MCG/DOSE AEPB INHALE 1 PUFF BY MOUTH INTO THE LUNGS EVERY 12 HOURS (Patient taking differently: Inhale 1 puff into the lungs 2 (two) times daily. ) 60 each 5  . aspirin 81 MG tablet Take 81 mg by mouth daily.      . Biotin w/ Vitamins C & E (HAIR/SKIN/NAILS PO) Take 1 tablet by mouth daily.    . bisoprolol (ZEBETA) 5 MG tablet TAKE ONE-HALF TABLET BY MOUTH EVERY DAY (Patient taking differently: Take 2.5 mg by mouth at bedtime. ) 90 tablet 3  . Blood Glucose Monitoring Suppl (BAYER CONTOUR MONITOR) w/Device KIT CHECK BLOOD SUGAR EVERY DAY 1 kit 0  . Blood Glucose Monitoring Suppl (ONE TOUCH ULTRA 2) w/Device KIT Check BS daily 1 kit 0  . clopidogrel (PLAVIX) 75 MG tablet Take 1 tablet (75 mg total) by mouth daily. 30 tablet 11  . CONTOUR NEXT TEST test strip CHECK BLOOD SUGAR DAILY AS DIRECTED 100 strip 3    . CREON 36000 units CPEP capsule TAKE 2 CAPSULES BY MOUTH THREE TIMES DAILY BEFORE MEALS (Patient taking differently: Take 108,000 Units by mouth 3 (three) times daily before meals. ) 180 capsule 0  . diclofenac sodium (VOLTAREN) 1 % GEL Apply 2 g topically 4 (four) times daily. (Patient taking differently: Apply 2 g topically 4 (four) times daily as needed (for hand pain). ) 100 g 0  . glucose blood (ONETOUCH ULTRA) test strip Check BS daily  DX: E11.9 100 each 12  . HYDROcodone-acetaminophen (NORCO/VICODIN) 5-325 MG tablet Take 1 tablet by mouth every 6 (six) hours as needed for moderate pain. Pain Management, [place] 130 tablet 0  . isosorbide mononitrate (IMDUR) 60 MG 24 hr tablet TAKE 1 TABLET BY MOUTH DAILY (Patient taking differently: Take 60 mg by mouth daily. ) 90 tablet 2  . Lancet Devices (ONE TOUCH DELICA LANCING DEV) MISC Use daily DxE11.59 100 each 3  . Lancets (ONETOUCH DELICA PLUS UXLKGM01U) MISC 1 Units  by Other route daily. 100 each 3  . metFORMIN (GLUCOPHAGE) 1000 MG tablet TAKE 1 TABLET BY MOUTH TWICE DAILY WITH MEALS (Patient taking differently: Take 1,000 mg by mouth in the morning and at bedtime. ) 180 tablet 0  . methocarbamol (ROBAXIN) 500 MG tablet TAKE 1 TABLET BY MOUTH EVERY 6 HOURS AS NEEDED FOR MUSCLE SPASMS (Patient taking differently: Take 500 mg by mouth every 6 (six) hours as needed for muscle spasms. ) 120 tablet 2  . Multiple Vitamin (MULTIVITAMIN) tablet Take 1 tablet by mouth daily.      . nitroGLYCERIN (NITROSTAT) 0.4 MG SL tablet Place 1 tablet under the tongue every 5 (five) minutes as needed for chest pain.    Marland Kitchen ondansetron (ZOFRAN) 4 MG tablet TAKE 1 TABLET BY MOUTH EVERY 8 HOURS AS NEEDED FOR NAUSEA OR VOMITING (Patient taking differently: Take 4 mg by mouth daily. ) 30 tablet 2  . pantoprazole (PROTONIX) 40 MG tablet TAKE 1 TABLET BY MOUTH DAILY 90 tablet 3  . pioglitazone (ACTOS) 30 MG tablet Take 1 tablet (30 mg total) by mouth daily. 90 tablet 3  .  Probiotic Product (TRUBIOTICS PO) Take 1 capsule by mouth daily.    . rosuvastatin (CRESTOR) 40 MG tablet Take 1 tablet (40 mg total) by mouth at bedtime. 90 tablet 3  . VENTOLIN HFA 108 (90 Base) MCG/ACT inhaler INHALE 2 PUFFS BY MOUTH TWICE DAILY AS NEEDED FOR WHEEZING OR SHORTNESS OF BREATH (Patient taking differently: Inhale 1 puff into the lungs 2 (two) times daily as needed for wheezing or shortness of breath. ) 18 g 3   No current facility-administered medications for this visit.    REVIEW OF SYSTEMS:  _0  denotes positive finding, _1  denotes negative finding Cardiac  Comments:  Chest pain or chest pressure:    Shortness of breath upon exertion:    Short of breath when lying flat:    Irregular heart rhythm:        Vascular    Pain in calf, thigh, or hip brought on by ambulation: x Right calf  Pain in feet at night that wakes you up from your sleep:     Blood clot in your veins:    Leg swelling:         Pulmonary    Oxygen at home:    Productive cough:     Wheezing:         Neurologic    Sudden weakness in arms or legs:     Sudden numbness in arms or legs:     Sudden onset of difficulty speaking or slurred speech:    Temporary loss of vision in one eye:     Problems with dizziness:         Gastrointestinal    Blood in stool:     Vomited blood:         Genitourinary    Burning when urinating:     Blood in urine:        Psychiatric    Major depression:         Hematologic    Bleeding problems:    Problems with blood clotting too easily:        Skin    Rashes or ulcers:        Constitutional    Fever or chills:      PHYSICAL EXAM: Vitals:   09/10/19 1114  BP: (!) 140/77  Pulse: 87  Resp: 16  Temp: (!) 97.5 F (  36.4 C)  TempSrc: Temporal  SpO2: 100%  Weight: 147 lb (66.7 kg)  Height: 5' 2.5" (1.588 m)    GENERAL: The patient is a well-nourished female, in no acute distress. The vital signs are documented above. CARDIAC: There is a regular rate  and rhythm.  VASCULAR:  Left femoral 2+ palpable Left DP palpable Right femoral weaker but palpable No palpable right pedal pulses PULMONARY: There is good air exchange bilaterally without wheezing or rales. ABDOMEN: Soft and non-tender with normal pitched bowel sounds.  MUSCULOSKELETAL: There are no major deformities or cyanosis. NEUROLOGIC: No focal weakness or paresthesias are detected. SKIN: There are no ulcers or rashes noted. PSYCHIATRIC: The patient has a normal affect.  DATA:   Reviewed previous arteriogram that shows high-grade right common femoral artery stenosis with diffusely diseased SFA and three-vessel runoff.  Assessment/Plan:  64 year old female presents for follow-up of her right calf claudication in the setting of high-grade common femoral artery stenosis.  I was initially consulted in the hospital and offered her common femoral endarterectomy at her convenience.  Ultimately her transfemoral access in the right groin by Dr. Fletcher Anon was complicated by hematoma and she wanted this to resolve before having surgery.  On evaluation today her right groin looks very good and I do not see any evidence of residual hematoma.  She still having right calf claudication symptoms.  Discussed right femoral endarterectomy including risks and benefits.  She wishes to get scheduled in the mid August assuming she is not have any flares of her pancreatitis.   Marty Heck, MD [facility] Office: [phone]

## 2019-09-17 ENCOUNTER — Other Ambulatory Visit: Payer: Self-pay

## 2019-09-30 ENCOUNTER — Telehealth: Payer: Self-pay | Admitting: Family Medicine

## 2019-09-30 NOTE — Telephone Encounter (Signed)
Patient called requesting a refill on her hydrocodone  CB# [phone]

## 2019-09-30 NOTE — Progress Notes (Signed)
[id number] [place] Otto, [place] - [address] AT [facility] OF [address] [place] [address] Phone: [phone] Fax: [phone]      Your procedure is scheduled on August 26   Report to [facility] Main Entrance "A" at 1000 A.M., and check in at the Admitting office.  Call this number if you have problems the morning of surgery:  [phone]  Call [phone] if you have any questions prior to your surgery date Monday-Friday 8am-4pm    Remember:  Do not eat or drink after midnight the night before your surgery     Take these medicines the morning of surgery with A SIP OF WATER  ADVAIR DISKUS, Please bring all inhalers with you the day of surgery.  Aspirin bisoprolol (ZEBETA) HYDROcodone-acetaminophen (NORCO/VICODIN)  If needed  isosorbide mononitrate (IMDUR)  methocarbamol (ROBAXIN)  pantoprazole (PROTONIX) VENTOLIN  If needed  STOP PLAVIX 5 DAYS Prior to surgery  As of today, STOP taking any Aleve, Naproxen, Ibuprofen, Motrin, Advil, Goody's, BC's, all herbal medications, fish oil, and all vitamins.           WHAT DO I DO ABOUT MY DIABETES MEDICATION?   Marland Kitchen Do not take oral diabetes medicines (pills) the morning of surgery. metFORMIN (GLUCOPHAGE) and pioglitazone (ACTOS)    HOW TO MANAGE YOUR DIABETES BEFORE AND AFTER SURGERY  Why is it important to control my blood sugar before and after surgery? . Improving blood sugar levels before and after surgery helps healing and can limit problems. . A way of improving blood sugar control is eating a healthy diet by: o  Eating less sugar and carbohydrates o  Increasing activity/exercise o  Talking with your doctor about reaching your blood sugar goals . High blood sugars (greater than 180 mg/dL) can raise your risk of infections and slow your recovery, so you will need to focus on controlling your diabetes during the weeks before surgery. . Make sure that the  doctor who takes care of your diabetes knows about your planned surgery including the date and location.  How do I manage my blood sugar before surgery? . Check your blood sugar at least 4 times a day, starting 2 days before surgery, to make sure that the level is not too high or low. . Check your blood sugar the morning of your surgery when you wake up and every 2 hours until you get to the Short Stay unit. o If your blood sugar is less than 70 mg/dL, you will need to treat for low blood sugar: - Do not take insulin. - Treat a low blood sugar (less than 70 mg/dL) with  cup of clear juice (cranberry or apple), 4 glucose tablets, OR glucose gel. - Recheck blood sugar in 15 minutes after treatment (to make sure it is greater than 70 mg/dL). If your blood sugar is not greater than 70 mg/dL on recheck, call [phone] for further instructions. . Report your blood sugar to the short stay nurse when you get to Short Stay.  . If you are admitted to the hospital after surgery: o Your blood sugar will be checked by the staff and you will probably be given insulin after surgery (instead of oral diabetes medicines) to make sure you have good blood sugar levels. o The goal for blood sugar control after surgery is 80-180 mg/dL.              Do not wear jewelry, make up,  or nail polish            Do not wear lotions, powders, perfumes/colognes, or deodorant.            Do not shave 48 hours prior to surgery.  Men may shave face and neck.            Do not bring valuables to the hospital.            [facility] is not responsible for any belongings or valuables.  Do NOT Smoke (Tobacco/Vaping) or drink Alcohol 24 hours prior to your procedure If you use a CPAP at night, you may bring all equipment for your overnight stay.   Contacts, glasses, dentures or bridgework may not be worn into surgery.      For patients admitted to the hospital, discharge time will be determined by your treatment team.    Patients discharged the day of surgery will not be allowed to drive home, and someone needs to stay with them for 24 hours.    Special instructions:   [facility]- Preparing For Surgery  Before surgery, you can play an important role. Because skin is not sterile, your skin needs to be as free of germs as possible. You can reduce the number of germs on your skin by washing with CHG (chlorahexidine gluconate) Soap before surgery.  CHG is an antiseptic cleaner which kills germs and bonds with the skin to continue killing germs even after washing.    Oral Hygiene is also important to reduce your risk of infection.  Remember - BRUSH YOUR TEETH THE MORNING OF SURGERY WITH YOUR REGULAR TOOTHPASTE  Please do not use if you have an allergy to CHG or antibacterial soaps. If your skin becomes reddened/irritated stop using the CHG.  Do not shave (including legs and underarms) for at least 48 hours prior to first CHG shower. It is OK to shave your face.  Please follow these instructions carefully.   1. Shower the NIGHT BEFORE SURGERY and the MORNING OF SURGERY with CHG Soap.   2. If you chose to wash your hair, wash your hair first as usual with your normal shampoo.  3. After you shampoo, rinse your hair and body thoroughly to remove the shampoo.  4. Use CHG as you would any other liquid soap. You can apply CHG directly to the skin and wash gently with a scrungie or a clean washcloth.   5. Apply the CHG Soap to your body ONLY FROM THE NECK DOWN.  Do not use on open wounds or open sores. Avoid contact with your eyes, ears, mouth and genitals (private parts). Wash Face and genitals (private parts)  with your normal soap.   6. Wash thoroughly, paying special attention to the area where your surgery will be performed.  7. Thoroughly rinse your body with warm water from the neck down.  8. DO NOT shower/wash with your normal soap after using and rinsing off the CHG Soap.  9. Pat yourself dry with a  CLEAN TOWEL.  10. Wear CLEAN PAJAMAS to bed the night before surgery  11. Place CLEAN SHEETS on your bed the night of your first shower and DO NOT SLEEP WITH PETS.   Day of Surgery: Wear Clean/Comfortable clothing the morning of surgery Do not apply any deodorants/lotions.   Remember to brush your teeth WITH YOUR REGULAR TOOTHPASTE.   Please read over the following fact sheets that you were given.

## 2019-10-01 ENCOUNTER — Other Ambulatory Visit: Payer: Self-pay

## 2019-10-01 ENCOUNTER — Encounter (HOSPITAL_COMMUNITY): Payer: Self-pay

## 2019-10-01 ENCOUNTER — Other Ambulatory Visit: Payer: Self-pay | Admitting: Family Medicine

## 2019-10-01 ENCOUNTER — Encounter (HOSPITAL_COMMUNITY)
Admission: RE | Admit: 2019-10-01 | Discharge: 2019-10-01 | Disposition: A | Discharge status: Home / Self Care | Payer: 59 | Source: Ambulatory Visit | Attending: Vascular Surgery | Admitting: Vascular Surgery

## 2019-10-01 DIAGNOSIS — Z7901 Long term (current) use of anticoagulants: Secondary | ICD-10-CM | POA: Diagnosis not present

## 2019-10-01 DIAGNOSIS — Z7982 Long term (current) use of aspirin: Secondary | ICD-10-CM | POA: Insufficient documentation

## 2019-10-01 DIAGNOSIS — I10 Essential (primary) hypertension: Secondary | ICD-10-CM | POA: Diagnosis not present

## 2019-10-01 DIAGNOSIS — E785 Hyperlipidemia, unspecified: Secondary | ICD-10-CM | POA: Diagnosis not present

## 2019-10-01 DIAGNOSIS — Z87891 Personal history of nicotine dependence: Secondary | ICD-10-CM | POA: Insufficient documentation

## 2019-10-01 DIAGNOSIS — E118 Type 2 diabetes mellitus with unspecified complications: Secondary | ICD-10-CM | POA: Insufficient documentation

## 2019-10-01 DIAGNOSIS — Z7984 Long term (current) use of oral hypoglycemic drugs: Secondary | ICD-10-CM | POA: Diagnosis not present

## 2019-10-01 DIAGNOSIS — I739 Peripheral vascular disease, unspecified: Secondary | ICD-10-CM | POA: Diagnosis not present

## 2019-10-01 DIAGNOSIS — I2510 Atherosclerotic heart disease of native coronary artery without angina pectoris: Secondary | ICD-10-CM | POA: Diagnosis not present

## 2019-10-01 DIAGNOSIS — I708 Atherosclerosis of other arteries: Secondary | ICD-10-CM | POA: Insufficient documentation

## 2019-10-01 DIAGNOSIS — Z79899 Other long term (current) drug therapy: Secondary | ICD-10-CM | POA: Diagnosis not present

## 2019-10-01 DIAGNOSIS — Z01812 Encounter for preprocedural laboratory examination: Secondary | ICD-10-CM | POA: Insufficient documentation

## 2019-10-01 DIAGNOSIS — Z7902 Long term (current) use of antithrombotics/antiplatelets: Secondary | ICD-10-CM | POA: Diagnosis not present

## 2019-10-01 LAB — APTT: aPTT: 35 seconds (ref 24–36)

## 2019-10-01 LAB — COMPREHENSIVE METABOLIC PANEL
ALT: 17 U/L (ref 0–44)
AST: 20 U/L (ref 15–41)
Albumin: 4.1 g/dL (ref 3.5–5.0)
Alkaline Phosphatase: 66 U/L (ref 38–126)
Anion gap: 10 (ref 5–15)
BUN: 20 mg/dL (ref 8–23)
CO2: 25 mmol/L (ref 22–32)
Calcium: 9.4 mg/dL (ref 8.9–10.3)
Chloride: 102 mmol/L (ref 98–111)
Creatinine, Ser: 0.98 mg/dL (ref 0.44–1.00)
GFR calc Af Amer: >60 mL/min (ref >60)
GFR calc non Af Amer: >60 mL/min (ref >60)
Glucose, Bld: 203 mg/dL — ABNORMAL HIGH (ref 70–99)
Potassium: 4.3 mmol/L (ref 3.5–5.1)
Sodium: 137 mmol/L (ref 135–145)
Total Bilirubin: 0.5 mg/dL (ref 0.3–1.2)
Total Protein: 6.7 g/dL (ref 6.5–8.1)

## 2019-10-01 LAB — CBC
HCT: 39.6 % (ref 36.0–46.0)
Hemoglobin: 11.8 g/dL — ABNORMAL LOW (ref 12.0–15.0)
MCH: 27.9 pg (ref 26.0–34.0)
MCHC: 29.8 g/dL — ABNORMAL LOW (ref 30.0–36.0)
MCV: 93.6 fL (ref 80.0–100.0)
Platelets: 305 10*3/uL (ref 150–400)
RBC: 4.23 MIL/uL (ref 3.87–5.11)
RDW: 15.7 % — ABNORMAL HIGH (ref 11.5–15.5)
WBC: 6.1 10*3/uL (ref 4.0–10.5)
nRBC: 0 % (ref 0.0–0.2)

## 2019-10-01 LAB — URINALYSIS, ROUTINE W REFLEX MICROSCOPIC
Bilirubin Urine: NEGATIVE
Glucose, UA: NEGATIVE mg/dL
Hgb urine dipstick: NEGATIVE
Ketones, ur: NEGATIVE mg/dL
Leukocytes,Ua: NEGATIVE
Nitrite: NEGATIVE
Protein, ur: NEGATIVE mg/dL
Specific Gravity, Urine: 1.006 (ref 1.005–1.030)
pH: 5 (ref 5.0–8.0)

## 2019-10-01 LAB — PROTIME-INR
INR: 0.9 (ref 0.8–1.2)
Prothrombin Time: 12.1 seconds (ref 11.4–15.2)

## 2019-10-01 LAB — TYPE AND SCREEN
ABO/RH(D): O POS
Antibody Screen: NEGATIVE

## 2019-10-01 LAB — GLUCOSE, CAPILLARY: Glucose-Capillary: 93 mg/dL (ref 70–99)

## 2019-10-01 LAB — SURGICAL PCR SCREEN
MRSA, PCR: NEGATIVE
Staphylococcus aureus: NEGATIVE

## 2019-10-01 MED ORDER — HYDROCODONE-ACETAMINOPHEN 5-325 MG PO TABS: 1.0000 | ORAL_TABLET | Freq: Four times a day (QID) | ORAL | 0 refills | Status: DC | PRN

## 2019-10-01 NOTE — Progress Notes (Signed)
PCP Tanya Nones MD Cardiologist - Kirke Corin MD    Chest x-ray - n/a EKG - 03/05/19 Stress Test - 2016 ECHO - pt denies Cardiac Cath - pt denies   Fasting Blood Sugar - 80-114 Checks Blood Sugar 1x/day  Blood Thinner Instructions: per surgeon orders (per pt and instructions from [facility] paperwork pt brought) - stop Plavix 8/20 Aspirin Instructions: ok to take ASA DOS per pt paperwork from [facility] Follow your surgeon's instructions on when to stop Aspirin.  If no instructions were given by your surgeon then you will need to call the office to get those instructions.    ERAS Protcol - n/a PRE-SURGERY Ensure or G2- n/a  COVID TEST- 10/08/19  Coronavirus Screening  Have you experienced the following symptoms:  Cough yes/no: No Fever (>100.80F)  yes/no: No Runny nose yes/no: No Sore throat yes/no: No Difficulty breathing/shortness of breath  yes/no: No  Have you or a family member traveled in the last 14 days and where? yes/no: No   If the patient indicates "YES" to the above questions, their PAT will be rescheduled to limit the exposure to others and, the surgeon will be notified. THE PATIENT WILL NEED TO BE ASYMPTOMATIC FOR 14 DAYS.   If the patient is not experiencing any of these symptoms, the PAT nurse will instruct them to NOT bring anyone with them to their appointment since they may have these symptoms or traveled as well.   Please remind your patients and families that hospital visitation restrictions are in effect and the importance of the restrictions.     Anesthesia review: cardiac hx  Patient denies shortness of breath, fever, cough and chest pain at PAT appointment   All instructions explained to the patient, with a verbal understanding of the material. Patient agrees to go over the instructions while at home for a better understanding. Patient also instructed to self quarantine after being tested for COVID-19. The opportunity to ask questions was provided.

## 2019-10-02 LAB — HEMOGLOBIN A1C
Hgb A1c MFr Bld: 6.5 % — ABNORMAL HIGH (ref 4.8–5.6)
Mean Plasma Glucose: 139.85 mg/dL

## 2019-10-02 NOTE — Anesthesia Preprocedure Evaluation (Addendum)
Anesthesia Evaluation  Patient identified by MRN, date of birth, ID band Patient awake    Reviewed: Allergy & Precautions, H&P , NPO status , Patient's Chart, lab work & pertinent test results, reviewed documented beta blocker date and time   Airway Mallampati: II  TM Distance: >3 FB Neck ROM: Full    Dental no notable dental hx. (+) Edentulous Upper, Edentulous Lower, Dental Advisory Given   Pulmonary asthma , COPD,  COPD inhaler, former smoker,    Pulmonary exam normal breath sounds clear to auscultation       Cardiovascular hypertension, Pt. on medications and Pt. on home beta blockers + CAD and + Peripheral Vascular Disease   Rhythm:Regular Rate:Normal     Neuro/Psych  Headaches, negative psych ROS   GI/Hepatic Neg liver ROS, GERD  ,  Endo/Other  diabetes, Type 2, Oral Hypoglycemic Agents  Renal/GU negative Renal ROS  negative genitourinary   Musculoskeletal  (+) Arthritis , Osteoarthritis,    Abdominal   Peds  Hematology negative hematology ROS (+)   Anesthesia Other Findings   Reproductive/Obstetrics negative OB ROS                           Anesthesia Physical Anesthesia Plan  ASA: III  Anesthesia Plan: General   Post-op Pain Management:    Induction: Intravenous  PONV Risk Score and Plan: 4 or greater and Ondansetron, Midazolam and Diphenhydramine  Airway Management Planned: Oral ETT  Additional Equipment:   Intra-op Plan:   Post-operative Plan: Extubation in OR  Informed Consent: I have reviewed the patients History and Physical, chart, labs and discussed the procedure including the risks, benefits and alternatives for the proposed anesthesia with the patient or authorized representative who has indicated his/her understanding and acceptance.     Dental advisory given  Plan Discussed with: CRNA  Anesthesia Plan Comments: (PAT note written 10/02/2019 by Shonna Chock, PA-C. )       Anesthesia Quick Evaluation

## 2019-10-02 NOTE — Progress Notes (Signed)
Anesthesia Chart Review:  Case: [id number] Date/Time: 10/10/19 1145   Procedure: ENDARTERECTOMY FEMORAL (Right )   Anesthesia type: General   Pre-op diagnosis: RIGHT FEMORAL ARTERY STENOSIS   Location: [facility] OR ROOM 11 / [facility]   Surgeons: Marty Heck, MD      DISCUSSION: Patient is a 64 year old female scheduled for the above procedure. She was referred for surgery by her cardiologist Dr. Fletcher Anon.   History includes former smoker (quit 12/13/08), PAD (s/p right SFA atherectomy 20011 and right SFA stent 05/27/15; left SFA atherectomy/angioplasty 05/08/19), dyslipidemia, DM2, HTN, CAD (diffuse distal and branch disease, with no good PCI options, medical management, 2011), renal artery stenosis (40% left pRAS, 50% right oRAS 05/27/15), pancreatitis (2003; 09/2018), TKA (right 07/15/09, left 05/22/14), migraines. Denied COPD, although mild centrilobular emphysematous disease on 03/27/19 CT. Reports she was slow to wake up after anesthesia in 2011.   Last cardiology visit 05/28/19 with Dr. Fletcher Anon. CAD felt stable. Right femoral endarterectomy recommended based on claudication symptoms and 05/08/19 arteriogram results, but surgery delayed until after May due to right groin hematoma and also for personal reasons.  Reported instructions to stop Plavix 10/04/19 but will continue ASA.   Preoperative COVID-19 test is scheduled for 10/08/2019.  Anesthesia team to evaluate on the day of surgery.   VS: BP 125/66   Pulse 77   Temp 36.5 C (Oral)   Resp 17   Ht 5' 2.5" (1.588 m)   Wt 68.4 kg   SpO2 100%   BMI 27.16 kg/m    PROVIDERS: Susy Frizzle, MD is PCP  Kathlyn Sacramento, MD is cardiologist Wray Kearns, MD is GI. Last visit 03/01/19 for chronic pancreatitis follow-up. Last exacerbation 09/2018. EUS in 2019 did not find any definitve stricture or PD stone. No longer using alcohol Continue Creon. One year follow-op.    LABS: Labs reviewed: Acceptable for surgery. Last A1c was > 60 days, so repeated  and resulted at 6.5%. (all labs ordered are listed, but only abnormal results are displayed)  Labs Reviewed  CBC - Abnormal; Notable for the following components:      Result Value   Hemoglobin 11.8 (*)    MCHC 29.8 (*)    RDW 15.7 (*)    All other components within normal limits  COMPREHENSIVE METABOLIC PANEL - Abnormal; Notable for the following components:   Glucose, Bld 203 (*)    All other components within normal limits  SURGICAL PCR SCREEN  GLUCOSE, CAPILLARY  APTT  PROTIME-INR  URINALYSIS, ROUTINE W REFLEX MICROSCOPIC  TYPE AND SCREEN    IMAGES: CT Chest (limited) 03/27/19: IMPRESSION: 1. Minimal degenerative change of the right sacroiliac joint. No focal lytic/sclerotic lesion. No fracture or dislocation. No focal soft tissue abnormality in the region of the right sternoclavicular joint. 2. Aortic Atherosclerosis (ICD10-I70.0) and Emphysema (ICD10-J43.9).   EKG: 03/05/19: NSR   CV: Abdominal Aortogram with BLE runoff 05/08/19 (Dr. Rogue Jury): 1.  Left lower extremity: Borderline significant stenosis affecting the common iliac artery, severe calcified stenosis affecting the proximal SFA and three-vessel runoff below the knee. 2.  Right lower extremity: Severe calcified disease affecting the common femoral artery, patent proximal SFA stent followed by diffuse 60% disease throughout the whole SFA and three-vessel runoff below the knee 3.  Successful orbital atherectomy and drug-coated balloon angioplasty to the left SFA. - Recommendations: The patient is already on dual antiplatelet therapy. Recommend right common femoral artery endarterectomy given severe claudication on the right side.  I  consulted Dr. Carlis Abbott and reviewed the images with him.   Nuclear stress test 03/24/14: Overall Impression:  Low risk stress nuclear study , Possible distal anteroseptal infarct pattern. No significant ischemia identified. Normal wall motion would on gated images would refute old  infarct.. LV Ejection Fraction: 67%.  LV Wall Motion:  NL LV Function; NL Wall Motion   Cardiac cath 02/18/09: LV: EF 60%. No regional wall motion abnormalities. Abd Aortogram: 40-50% proximal left RAS. Severe diffuse right iliac artery stenosis.  RCA: Dominant vessel. 50% mid RCA. 95% mid PLV stenosis and 60% mid PDA stenosis.  LM: No significant CAD. LCX: Small-to-moderate OM1 with serial 90% stenoses x3. Small OM2 moderately diffusely diseased. Moderate-to-large OM3 with diffuse mild disease. Distal AV Cx had diffuse moderate disease. LAD: 2 very small proximal diagonals with minimal diseae. Very large D3 that extends almost down to the apex. 70% proximal DIAG, 80-90% mid DIAG, 70% distal DIAG and then in the remainder of the distal vessel, there was diffuse severe disease. Mid LAD just after the takeoff of D3 had 40% with diffuse disease in the apical LAD. - Severe diffuse distal and branch vessel disease. Most significant disease in large D3 and probably accounted for apical anterior ischemia on Myoveiw. Given it's diffuse severe distal disease, PCI not felt to provide much benefit. Overall, no good interventional options. Medical management recommended.    Past Medical History:  Diagnosis Date  . Asthma with COPD ([place])    PATIENT DENIES  . CAD (coronary artery disease)    a. [place] 2011: Diffuse distal and branch vessel disease - patient managed medically, no interventional options. b. [place] 03/2014 - low risk, no ischemia.  . Complication of anesthesia    slow to wake up with last surgery in 2011   . Diabetes mellitus with circulatory complication ([place])   . Dyslipidemia   . Headache    hx of migraines   . Hypertension   . Osteoarthritis    severe right knee  R TKR  . PAD (peripheral artery disease) ([place])    a. Severe stenosis mid right SFA s/p atherectomy 03/25/09. b. peripheral angiography in 12/2011 which showed only about 50% diffuse right SFA stenosis.  . Pancreatitis 2003  . Renal  artery stenosis ([place])    a. [place] 2011 - 40-50% left RAS.  . Tobacco use disorder    quit 11/10    Past Surgical History:  Procedure Laterality Date  . ABDOMINAL AORTAGRAM N/A 12/21/2011   Procedure: ABDOMINAL Maxcine Ham;  Surgeon: Wellington Hampshire, MD;  Location: [facility]ATH [facility];  Service: Cardiovascular;  Laterality: N/A;  . ABDOMINAL AORTOGRAM W/LOWER EXTREMITY Bilateral 05/08/2019   Procedure: ABDOMINAL AORTOGRAM W/LOWER EXTREMITY;  Surgeon: Wellington Hampshire, MD;  Location: [place];  Service: Cardiovascular;  Laterality: Bilateral;  . CARDIAC CATHETERIZATION    . ESOPHAGOGASTRODUODENOSCOPY (EGD) WITH PROPOFOL N/A 10/05/2017   Procedure: ESOPHAGOGASTRODUODENOSCOPY (EGD) WITH PROPOFOL;  Surgeon: Milus Banister, MD;  Location: [facility];  Service: Endoscopy;  Laterality: N/A;  . EUS N/A 10/05/2017   Procedure: UPPER ENDOSCOPIC ULTRASOUND (EUS) RADIAL;  Surgeon: Milus Banister, MD;  Location: [facility];  Service: Endoscopy;  Laterality: N/A;  . FOOT SURGERY Left   . LOWER EXTREMITY ANGIOGRAM Bilateral 05/27/2015   Procedure: Lower Extremity Angiogram;  Surgeon: Wellington Hampshire, MD;  Location: [facility] CV LAB;  Service: Cardiovascular;  Laterality: Bilateral;  . PERIPHERAL VASCULAR ATHERECTOMY Left 05/08/2019   Procedure: PERIPHERAL VASCULAR ATHERECTOMY;  Surgeon: Kathlyn Sacramento  A, MD;  Location: [facility] CV LAB;  Service: Cardiovascular;  Laterality: Left;  . PERIPHERAL VASCULAR CATHETERIZATION N/A 05/27/2015   Procedure: Abdominal Aortogram;  Surgeon: Wellington Hampshire, MD;  Location: [facility] CV LAB;  Service: Cardiovascular;  Laterality: N/A;  . PERIPHERAL VASCULAR CATHETERIZATION Right 05/27/2015   Procedure: Peripheral Vascular Intervention;  Surgeon: Wellington Hampshire, MD;  Location: [place] CV LAB;  Service: Cardiovascular;  Laterality: Right;  SFA  . TOTAL KNEE ARTHROPLASTY Right 2011  . TOTAL KNEE ARTHROPLASTY Left 05/22/2014   Procedure: LEFT TOTAL KNEE  ARTHROPLASTY;  Surgeon: Latanya Maudlin, MD;  Location: [facility]S;  Service: Orthopedics;  Laterality: Left;  . TUBAL LIGATION  1980    MEDICATIONS: . acetaminophen (TYLENOL) 500 MG tablet  . ADVAIR DISKUS 250-50 MCG/DOSE AEPB  . aspirin 81 MG tablet  . Biotin w/ Vitamins C & E (HAIR/SKIN/NAILS PO)  . bisoprolol (ZEBETA) 5 MG tablet  . Blood Glucose Monitoring Suppl (BAYER CONTOUR MONITOR) w/Device KIT  . Blood Glucose Monitoring Suppl (ONE TOUCH ULTRA 2) w/Device KIT  . clopidogrel (PLAVIX) 75 MG tablet  . CONTOUR NEXT TEST test strip  . CREON 36000 units CPEP capsule  . diclofenac sodium (VOLTAREN) 1 % GEL  . glucose blood (ONETOUCH ULTRA) test strip  . HYDROcodone-acetaminophen (NORCO/VICODIN) 5-325 MG tablet  . isosorbide mononitrate (IMDUR) 60 MG 24 hr tablet  . Lancet Devices (ONE TOUCH DELICA LANCING DEV) MISC  . Lancets (ONETOUCH DELICA PLUS [id number]) MISC  . metFORMIN (GLUCOPHAGE) 1000 MG tablet  . methocarbamol (ROBAXIN) 500 MG tablet  . Multiple Vitamin (MULTIVITAMIN) tablet  . nitroGLYCERIN (NITROSTAT) 0.4 MG SL tablet  . ondansetron (ZOFRAN) 4 MG tablet  . pantoprazole (PROTONIX) 40 MG tablet  . pioglitazone (ACTOS) 30 MG tablet  . Probiotic Product (TRUBIOTICS PO)  . rosuvastatin (CRESTOR) 40 MG tablet  . VENTOLIN HFA 108 (90 Base) MCG/ACT inhaler   No current facility-administered medications for this encounter.    Myra Gianotti, PA-C Surgical Short Stay/Anesthesiology [facility] Phone [phone] [facility] Phone [phone] 10/02/2019 12:07 PM

## 2019-10-08 ENCOUNTER — Other Ambulatory Visit
Admission: RE | Admit: 2019-10-08 | Discharge: 2019-10-08 | Disposition: A | Discharge status: Home / Self Care | Payer: 59 | Source: Ambulatory Visit | Attending: Vascular Surgery | Admitting: Vascular Surgery

## 2019-10-08 ENCOUNTER — Other Ambulatory Visit: Payer: Self-pay

## 2019-10-08 DIAGNOSIS — Z01812 Encounter for preprocedural laboratory examination: Secondary | ICD-10-CM | POA: Insufficient documentation

## 2019-10-08 DIAGNOSIS — Z20822 Contact with and (suspected) exposure to covid-19: Secondary | ICD-10-CM | POA: Diagnosis not present

## 2019-10-08 LAB — SARS CORONAVIRUS 2 (TAT 6-24 HRS): SARS Coronavirus 2: NEGATIVE

## 2019-10-10 ENCOUNTER — Inpatient Hospital Stay (HOSPITAL_COMMUNITY): Payer: 59 | Admitting: Vascular Surgery

## 2019-10-10 ENCOUNTER — Encounter (HOSPITAL_COMMUNITY): Payer: Self-pay | Admitting: Vascular Surgery

## 2019-10-10 ENCOUNTER — Inpatient Hospital Stay (HOSPITAL_COMMUNITY)
Admission: RE | Admit: 2019-10-10 | Discharge: 2019-10-11 | DRG: 254 | Disposition: A | Discharge status: Home / Self Care | Payer: 59 | Attending: Vascular Surgery | Admitting: Vascular Surgery

## 2019-10-10 ENCOUNTER — Encounter (HOSPITAL_COMMUNITY)
Admission: RE | Disposition: A | Discharge status: Home / Self Care | Payer: Self-pay | Source: Home / Self Care | Attending: Vascular Surgery

## 2019-10-10 ENCOUNTER — Other Ambulatory Visit: Payer: Self-pay

## 2019-10-10 DIAGNOSIS — Z825 Family history of asthma and other chronic lower respiratory diseases: Secondary | ICD-10-CM | POA: Diagnosis not present

## 2019-10-10 DIAGNOSIS — J449 Chronic obstructive pulmonary disease, unspecified: Secondary | ICD-10-CM | POA: Diagnosis present

## 2019-10-10 DIAGNOSIS — Z7902 Long term (current) use of antithrombotics/antiplatelets: Secondary | ICD-10-CM

## 2019-10-10 DIAGNOSIS — Z96653 Presence of artificial knee joint, bilateral: Secondary | ICD-10-CM | POA: Diagnosis present

## 2019-10-10 DIAGNOSIS — Z887 Allergy status to serum and vaccine status: Secondary | ICD-10-CM

## 2019-10-10 DIAGNOSIS — Z823 Family history of stroke: Secondary | ICD-10-CM | POA: Diagnosis not present

## 2019-10-10 DIAGNOSIS — E1159 Type 2 diabetes mellitus with other circulatory complications: Secondary | ICD-10-CM | POA: Diagnosis present

## 2019-10-10 DIAGNOSIS — M1990 Unspecified osteoarthritis, unspecified site: Secondary | ICD-10-CM | POA: Diagnosis present

## 2019-10-10 DIAGNOSIS — Z79899 Other long term (current) drug therapy: Secondary | ICD-10-CM

## 2019-10-10 DIAGNOSIS — I10 Essential (primary) hypertension: Secondary | ICD-10-CM | POA: Diagnosis present

## 2019-10-10 DIAGNOSIS — Z87891 Personal history of nicotine dependence: Secondary | ICD-10-CM | POA: Diagnosis not present

## 2019-10-10 DIAGNOSIS — Z888 Allergy status to other drugs, medicaments and biological substances status: Secondary | ICD-10-CM | POA: Diagnosis not present

## 2019-10-10 DIAGNOSIS — E785 Hyperlipidemia, unspecified: Secondary | ICD-10-CM | POA: Diagnosis present

## 2019-10-10 DIAGNOSIS — E1151 Type 2 diabetes mellitus with diabetic peripheral angiopathy without gangrene: Secondary | ICD-10-CM | POA: Diagnosis present

## 2019-10-10 DIAGNOSIS — Z803 Family history of malignant neoplasm of breast: Secondary | ICD-10-CM | POA: Diagnosis not present

## 2019-10-10 DIAGNOSIS — I70209 Unspecified atherosclerosis of native arteries of extremities, unspecified extremity: Secondary | ICD-10-CM | POA: Diagnosis present

## 2019-10-10 DIAGNOSIS — Z7982 Long term (current) use of aspirin: Secondary | ICD-10-CM

## 2019-10-10 DIAGNOSIS — I2510 Atherosclerotic heart disease of native coronary artery without angina pectoris: Secondary | ICD-10-CM | POA: Diagnosis present

## 2019-10-10 DIAGNOSIS — I739 Peripheral vascular disease, unspecified: Secondary | ICD-10-CM | POA: Diagnosis present

## 2019-10-10 DIAGNOSIS — Z7951 Long term (current) use of inhaled steroids: Secondary | ICD-10-CM

## 2019-10-10 DIAGNOSIS — Z91040 Latex allergy status: Secondary | ICD-10-CM

## 2019-10-10 DIAGNOSIS — I70211 Atherosclerosis of native arteries of extremities with intermittent claudication, right leg: Secondary | ICD-10-CM | POA: Diagnosis present

## 2019-10-10 HISTORY — PX: PATCH ANGIOPLASTY: SHX6230

## 2019-10-10 HISTORY — PX: ENDARTERECTOMY FEMORAL: SHX5804

## 2019-10-10 LAB — HEMOGLOBIN A1C
Hgb A1c MFr Bld: 6.8 % — ABNORMAL HIGH (ref 4.8–5.6)
Mean Plasma Glucose: 148.46 mg/dL

## 2019-10-10 LAB — GLUCOSE, CAPILLARY
Glucose-Capillary: 97 mg/dL (ref 70–99)
Glucose-Capillary: 93 mg/dL (ref 70–99)
Glucose-Capillary: 103 mg/dL — ABNORMAL HIGH (ref 70–99)
Glucose-Capillary: 130 mg/dL — ABNORMAL HIGH (ref 70–99)
Glucose-Capillary: 182 mg/dL — ABNORMAL HIGH (ref 70–99)

## 2019-10-10 LAB — CBC
HCT: 37.2 % (ref 36.0–46.0)
Hemoglobin: 11.3 g/dL — ABNORMAL LOW (ref 12.0–15.0)
MCH: 27.4 pg (ref 26.0–34.0)
MCHC: 30.4 g/dL (ref 30.0–36.0)
MCV: 90.1 fL (ref 80.0–100.0)
Platelets: 247 10*3/uL (ref 150–400)
RBC: 4.13 MIL/uL (ref 3.87–5.11)
RDW: 15.6 % — ABNORMAL HIGH (ref 11.5–15.5)
WBC: 6.1 10*3/uL (ref 4.0–10.5)
nRBC: 0 % (ref 0.0–0.2)

## 2019-10-10 LAB — CREATININE, SERUM
Creatinine, Ser: 1 mg/dL (ref 0.44–1.00)
GFR calc Af Amer: >60 mL/min (ref >60)
GFR calc non Af Amer: >60 mL/min (ref >60)

## 2019-10-10 SURGERY — ENDARTERECTOMY, FEMORAL
Anesthesia: General | Site: Groin | Laterality: Right

## 2019-10-10 MED ORDER — METFORMIN HCL 500 MG PO TABS
1000.0000 mg | ORAL_TABLET | Freq: Two times a day (BID) | ORAL | Status: DC
  Administered 2019-10-10 – 2019-10-11 (×2): 1000 mg via ORAL
  Filled 2019-10-10 (×2): qty 2

## 2019-10-10 MED ORDER — PHENYLEPHRINE HCL-NACL 10-0.9 MG/250ML-% IV SOLN
INTRAVENOUS | Status: DC | PRN
  Administered 2019-10-10: 25 ug/min via INTRAVENOUS

## 2019-10-10 MED ORDER — CHLORHEXIDINE GLUCONATE CLOTH 2 % EX PADS: 6.0000 | MEDICATED_PAD | Freq: Once | CUTANEOUS | Status: DC

## 2019-10-10 MED ORDER — METHOCARBAMOL 500 MG PO TABS: 500.0000 mg | ORAL_TABLET | Freq: Four times a day (QID) | ORAL | Status: DC | PRN

## 2019-10-10 MED ORDER — ORAL CARE MOUTH RINSE: 15.0000 mL | Freq: Once | OROMUCOSAL | Status: AC

## 2019-10-10 MED ORDER — ONDANSETRON HCL 4 MG PO TABS: 4.0000 mg | ORAL_TABLET | Freq: Three times a day (TID) | ORAL | Status: DC | PRN

## 2019-10-10 MED ORDER — GUAIFENESIN-DM 100-10 MG/5ML PO SYRP: 15.0000 mL | ORAL_SOLUTION | ORAL | Status: DC | PRN

## 2019-10-10 MED ORDER — SODIUM CHLORIDE 0.9 % IV SOLN
INTRAVENOUS | Status: DC | PRN
  Administered 2019-10-10: 500 mL

## 2019-10-10 MED ORDER — HEPARIN SODIUM (PORCINE) 1000 UNIT/ML IJ SOLN
INTRAMUSCULAR | Status: DC | PRN
  Administered 2019-10-10: 7000 [IU] via INTRAVENOUS
  Administered 2019-10-10: 2000 [IU] via INTRAVENOUS

## 2019-10-10 MED ORDER — SODIUM CHLORIDE 0.9 % IV SOLN: INTRAVENOUS | Status: DC

## 2019-10-10 MED ORDER — PROTAMINE SULFATE 10 MG/ML IV SOLN
INTRAVENOUS | Status: DC | PRN
  Administered 2019-10-10: 40 mg via INTRAVENOUS

## 2019-10-10 MED ORDER — HYDROMORPHONE HCL 1 MG/ML IJ SOLN
0.5000 mg | INTRAMUSCULAR | Status: DC | PRN
  Administered 2019-10-11: 1 mg via INTRAVENOUS
  Filled 2019-10-10: qty 1

## 2019-10-10 MED ORDER — PROPOFOL 10 MG/ML IV BOLUS
INTRAVENOUS | Status: DC | PRN
  Administered 2019-10-10: 120 mg via INTRAVENOUS

## 2019-10-10 MED ORDER — ONDANSETRON HCL 4 MG/2ML IJ SOLN: 4.0000 mg | Freq: Four times a day (QID) | INTRAMUSCULAR | Status: DC | PRN

## 2019-10-10 MED ORDER — MIDAZOLAM HCL 2 MG/2ML IJ SOLN
INTRAMUSCULAR | Status: AC
  Filled 2019-10-10: qty 2

## 2019-10-10 MED ORDER — CEFAZOLIN SODIUM-DEXTROSE 2-4 GM/100ML-% IV SOLN
2.0000 g | INTRAVENOUS | Status: AC
  Administered 2019-10-10: 2 g via INTRAVENOUS

## 2019-10-10 MED ORDER — CEFAZOLIN SODIUM-DEXTROSE 2-4 GM/100ML-% IV SOLN
INTRAVENOUS | Status: AC
  Filled 2019-10-10: qty 100

## 2019-10-10 MED ORDER — LACTATED RINGERS IV SOLN: INTRAVENOUS | Status: DC

## 2019-10-10 MED ORDER — ROCURONIUM BROMIDE 10 MG/ML (PF) SYRINGE
PREFILLED_SYRINGE | INTRAVENOUS | Status: DC | PRN
  Administered 2019-10-10: 40 mg via INTRAVENOUS

## 2019-10-10 MED ORDER — ACETAMINOPHEN 650 MG RE SUPP: 325.0000 mg | RECTAL | Status: DC | PRN

## 2019-10-10 MED ORDER — BISACODYL 5 MG PO TBEC: 5.0000 mg | DELAYED_RELEASE_TABLET | Freq: Every day | ORAL | Status: DC | PRN

## 2019-10-10 MED ORDER — MAGNESIUM SULFATE 2 GM/50ML IV SOLN: 2.0000 g | Freq: Every day | INTRAVENOUS | Status: DC | PRN

## 2019-10-10 MED ORDER — ACETAMINOPHEN 325 MG PO TABS: 325.0000 mg | ORAL_TABLET | ORAL | Status: DC | PRN

## 2019-10-10 MED ORDER — SODIUM CHLORIDE 0.9 % IV SOLN: 500.0000 mL | Freq: Once | INTRAVENOUS | Status: DC | PRN

## 2019-10-10 MED ORDER — HYDROMORPHONE HCL 1 MG/ML IJ SOLN
INTRAMUSCULAR | Status: AC
Start: 2019-10-10 — End: 2019-10-11
  Filled 2019-10-10: qty 1

## 2019-10-10 MED ORDER — DEXAMETHASONE SODIUM PHOSPHATE 4 MG/ML IJ SOLN
INTRAMUSCULAR | Status: DC | PRN
  Administered 2019-10-10: 10 mg via INTRAVENOUS

## 2019-10-10 MED ORDER — CEFAZOLIN SODIUM-DEXTROSE 2-4 GM/100ML-% IV SOLN
2.0000 g | Freq: Three times a day (TID) | INTRAVENOUS | Status: AC
  Administered 2019-10-10 – 2019-10-11 (×2): 2 g via INTRAVENOUS
  Filled 2019-10-10 (×2): qty 100

## 2019-10-10 MED ORDER — PROPOFOL 10 MG/ML IV BOLUS
INTRAVENOUS | Status: AC
  Filled 2019-10-10: qty 20

## 2019-10-10 MED ORDER — ALUM & MAG HYDROXIDE-SIMETH 200-200-20 MG/5ML PO SUSP: 15.0000 mL | ORAL | Status: DC | PRN

## 2019-10-10 MED ORDER — FENTANYL CITRATE (PF) 100 MCG/2ML IJ SOLN
INTRAMUSCULAR | Status: DC | PRN
  Administered 2019-10-10: 25 ug via INTRAVENOUS
  Administered 2019-10-10 (×3): 50 ug via INTRAVENOUS
  Administered 2019-10-10: 25 ug via INTRAVENOUS
  Administered 2019-10-10: 50 ug via INTRAVENOUS

## 2019-10-10 MED ORDER — BISOPROLOL FUMARATE 5 MG PO TABS
2.5000 mg | ORAL_TABLET | Freq: Every day | ORAL | Status: DC
  Administered 2019-10-10: 2.5 mg via ORAL
  Filled 2019-10-10: qty 1

## 2019-10-10 MED ORDER — HYDROCODONE-ACETAMINOPHEN 5-325 MG PO TABS
1.0000 | ORAL_TABLET | ORAL | Status: DC | PRN
  Administered 2019-10-10 – 2019-10-11 (×5): 2 via ORAL
  Filled 2019-10-10 (×5): qty 2

## 2019-10-10 MED ORDER — HYDRALAZINE HCL 20 MG/ML IJ SOLN: 5.0000 mg | INTRAMUSCULAR | Status: DC | PRN

## 2019-10-10 MED ORDER — FLEET ENEMA 7-19 GM/118ML RE ENEM: 1.0000 | ENEMA | Freq: Once | RECTAL | Status: DC | PRN

## 2019-10-10 MED ORDER — ALBUTEROL SULFATE (2.5 MG/3ML) 0.083% IN NEBU: 3.0000 mL | INHALATION_SOLUTION | Freq: Two times a day (BID) | RESPIRATORY_TRACT | Status: DC | PRN

## 2019-10-10 MED ORDER — CHLORHEXIDINE GLUCONATE 0.12 % MT SOLN
OROMUCOSAL | Status: AC
  Administered 2019-10-10: 15 mL via OROMUCOSAL
  Filled 2019-10-10: qty 15

## 2019-10-10 MED ORDER — ASPIRIN EC 81 MG PO TBEC
81.0000 mg | DELAYED_RELEASE_TABLET | Freq: Every day | ORAL | Status: DC
  Administered 2019-10-11: 81 mg via ORAL
  Filled 2019-10-10: qty 1

## 2019-10-10 MED ORDER — METOPROLOL TARTRATE 5 MG/5ML IV SOLN: 2.0000 mg | INTRAVENOUS | Status: DC | PRN

## 2019-10-10 MED ORDER — CLOPIDOGREL BISULFATE 75 MG PO TABS
75.0000 mg | ORAL_TABLET | Freq: Every day | ORAL | Status: DC
  Administered 2019-10-10 – 2019-10-11 (×2): 75 mg via ORAL
  Filled 2019-10-10 (×2): qty 1

## 2019-10-10 MED ORDER — PANTOPRAZOLE SODIUM 40 MG PO TBEC
40.0000 mg | DELAYED_RELEASE_TABLET | Freq: Every day | ORAL | Status: DC
  Administered 2019-10-11: 40 mg via ORAL
  Filled 2019-10-10 (×2): qty 1

## 2019-10-10 MED ORDER — SODIUM CHLORIDE 0.9 % IV SOLN
INTRAVENOUS | Status: AC
  Filled 2019-10-10: qty 1.2

## 2019-10-10 MED ORDER — POTASSIUM CHLORIDE CRYS ER 20 MEQ PO TBCR: 20.0000 meq | EXTENDED_RELEASE_TABLET | Freq: Every day | ORAL | Status: DC | PRN

## 2019-10-10 MED ORDER — PANCRELIPASE (LIP-PROT-AMYL) 12000-38000 UNITS PO CPEP
108000.0000 [IU] | ORAL_CAPSULE | Freq: Three times a day (TID) | ORAL | Status: DC
  Administered 2019-10-10: 108000 [IU] via ORAL
  Filled 2019-10-10 (×2): qty 9

## 2019-10-10 MED ORDER — HEPARIN SODIUM (PORCINE) 5000 UNIT/ML IJ SOLN
5000.0000 [IU] | Freq: Three times a day (TID) | INTRAMUSCULAR | Status: DC
  Administered 2019-10-10 – 2019-10-11 (×2): 5000 [IU] via SUBCUTANEOUS
  Filled 2019-10-10 (×2): qty 1

## 2019-10-10 MED ORDER — FENTANYL CITRATE (PF) 250 MCG/5ML IJ SOLN
INTRAMUSCULAR | Status: AC
  Filled 2019-10-10: qty 5

## 2019-10-10 MED ORDER — PHENOL 1.4 % MT LIQD: 1.0000 | OROMUCOSAL | Status: DC | PRN

## 2019-10-10 MED ORDER — ROCURONIUM BROMIDE 10 MG/ML (PF) SYRINGE
PREFILLED_SYRINGE | INTRAVENOUS | Status: AC
  Filled 2019-10-10: qty 10

## 2019-10-10 MED ORDER — ISOSORBIDE MONONITRATE ER 60 MG PO TB24
60.0000 mg | ORAL_TABLET | Freq: Every day | ORAL | Status: DC
  Administered 2019-10-11: 60 mg via ORAL
  Filled 2019-10-10: qty 1

## 2019-10-10 MED ORDER — SUGAMMADEX SODIUM 200 MG/2ML IV SOLN
INTRAVENOUS | Status: DC | PRN
  Administered 2019-10-10: 150 mg via INTRAVENOUS

## 2019-10-10 MED ORDER — SENNOSIDES-DOCUSATE SODIUM 8.6-50 MG PO TABS: 1.0000 | ORAL_TABLET | Freq: Every evening | ORAL | Status: DC | PRN

## 2019-10-10 MED ORDER — ACETAMINOPHEN 500 MG PO TABS
1000.0000 mg | ORAL_TABLET | Freq: Once | ORAL | Status: AC
  Administered 2019-10-10: 1000 mg via ORAL
  Filled 2019-10-10: qty 2

## 2019-10-10 MED ORDER — CHLORHEXIDINE GLUCONATE 0.12 % MT SOLN: 15.0000 mL | Freq: Once | OROMUCOSAL | Status: AC

## 2019-10-10 MED ORDER — MOMETASONE FURO-FORMOTEROL FUM 200-5 MCG/ACT IN AERO
2.0000 | INHALATION_SPRAY | Freq: Two times a day (BID) | RESPIRATORY_TRACT | Status: DC
  Administered 2019-10-11: 2 via RESPIRATORY_TRACT
  Filled 2019-10-10: qty 8.8

## 2019-10-10 MED ORDER — LIDOCAINE 2% (20 MG/ML) 5 ML SYRINGE
INTRAMUSCULAR | Status: DC | PRN
  Administered 2019-10-10: 60 mg via INTRAVENOUS

## 2019-10-10 MED ORDER — LIDOCAINE 2% (20 MG/ML) 5 ML SYRINGE
INTRAMUSCULAR | Status: AC
  Filled 2019-10-10: qty 5

## 2019-10-10 MED ORDER — PIOGLITAZONE HCL 30 MG PO TABS
30.0000 mg | ORAL_TABLET | Freq: Every day | ORAL | Status: DC
  Administered 2019-10-10: 30 mg via ORAL
  Filled 2019-10-10 (×2): qty 1

## 2019-10-10 MED ORDER — HEMOSTATIC AGENTS (NO CHARGE) OPTIME
TOPICAL | Status: DC | PRN
  Administered 2019-10-10: 1 via TOPICAL

## 2019-10-10 MED ORDER — NITROGLYCERIN 0.4 MG SL SUBL: 0.4000 mg | SUBLINGUAL_TABLET | SUBLINGUAL | Status: DC | PRN

## 2019-10-10 MED ORDER — ONDANSETRON HCL 4 MG/2ML IJ SOLN
INTRAMUSCULAR | Status: DC | PRN
  Administered 2019-10-10: 4 mg via INTRAVENOUS

## 2019-10-10 MED ORDER — MIDAZOLAM HCL 5 MG/5ML IJ SOLN
INTRAMUSCULAR | Status: DC | PRN
  Administered 2019-10-10: 2 mg via INTRAVENOUS

## 2019-10-10 MED ORDER — HYDROMORPHONE HCL 1 MG/ML IJ SOLN
0.2500 mg | INTRAMUSCULAR | Status: DC | PRN
  Administered 2019-10-10 (×2): 0.5 mg via INTRAVENOUS

## 2019-10-10 MED ORDER — DOCUSATE SODIUM 100 MG PO CAPS
100.0000 mg | ORAL_CAPSULE | Freq: Every day | ORAL | Status: DC
  Administered 2019-10-11: 100 mg via ORAL
  Filled 2019-10-10: qty 1

## 2019-10-10 MED ORDER — INSULIN ASPART 100 UNIT/ML ~~LOC~~ SOLN: 0.0000 [IU] | Freq: Three times a day (TID) | SUBCUTANEOUS | Status: DC

## 2019-10-10 MED ORDER — 0.9 % SODIUM CHLORIDE (POUR BTL) OPTIME
TOPICAL | Status: DC | PRN
  Administered 2019-10-10: 1000 mL

## 2019-10-10 MED ORDER — ROSUVASTATIN CALCIUM 20 MG PO TABS
40.0000 mg | ORAL_TABLET | Freq: Every day | ORAL | Status: DC
  Administered 2019-10-10: 40 mg via ORAL
  Filled 2019-10-10: qty 2

## 2019-10-10 MED ORDER — STERILE WATER FOR IRRIGATION IR SOLN
Status: DC | PRN
  Administered 2019-10-10: 1000 mL

## 2019-10-10 MED ORDER — LABETALOL HCL 5 MG/ML IV SOLN: 10.0000 mg | INTRAVENOUS | Status: DC | PRN

## 2019-10-10 SURGICAL SUPPLY — 46 items
CANISTER SUCT 3000ML PPV (MISCELLANEOUS) ×3 IMPLANT
CLIP VESOCCLUDE MED 24/CT (CLIP) ×3 IMPLANT
CLIP VESOCCLUDE SM WIDE 24/CT (CLIP) ×3 IMPLANT
COVER WAND RF STERILE (DRAPES) ×3 IMPLANT
DERMABOND ADVANCED (GAUZE/BANDAGES/DRESSINGS) ×1
DERMABOND ADVANCED .7 DNX12 (GAUZE/BANDAGES/DRESSINGS) ×2 IMPLANT
DRAIN CHANNEL 15F RND FF W/TCR (WOUND CARE) IMPLANT
ELECT REM PT RETURN 9FT ADLT (ELECTROSURGICAL) ×3
ELECTRODE REM PT RTRN 9FT ADLT (ELECTROSURGICAL) ×2 IMPLANT
EVACUATOR SILICONE 100CC (DRAIN) IMPLANT
GLOVE BIO SURGEON STRL SZ7.5 (GLOVE) ×3 IMPLANT
GLOVE BIOGEL PI IND STRL 6.5 (GLOVE) ×2 IMPLANT
GLOVE BIOGEL PI IND STRL 7.0 (GLOVE) ×2 IMPLANT
GLOVE BIOGEL PI IND STRL 7.5 (GLOVE) ×2 IMPLANT
GLOVE BIOGEL PI IND STRL 8 (GLOVE) ×2 IMPLANT
GLOVE BIOGEL PI INDICATOR 6.5 (GLOVE) ×1
GLOVE BIOGEL PI INDICATOR 7.0 (GLOVE) ×1
GLOVE BIOGEL PI INDICATOR 7.5 (GLOVE) ×1
GLOVE BIOGEL PI INDICATOR 8 (GLOVE) ×1
GLOVE SURG SS PI 7.0 STRL IVOR (GLOVE) ×3 IMPLANT
GOWN STRL REUS W/ TWL LRG LVL3 (GOWN DISPOSABLE) ×6 IMPLANT
GOWN STRL REUS W/ TWL XL LVL3 (GOWN DISPOSABLE) ×4 IMPLANT
GOWN STRL REUS W/TWL LRG LVL3 (GOWN DISPOSABLE) ×9
GOWN STRL REUS W/TWL XL LVL3 (GOWN DISPOSABLE) ×6
HEMOSTAT SNOW SURGICEL 2X4 (HEMOSTASIS) ×3 IMPLANT
HEMOSTAT SPONGE AVITENE ULTRA (HEMOSTASIS) IMPLANT
KIT BASIN OR (CUSTOM PROCEDURE TRAY) ×3 IMPLANT
KIT TURNOVER KIT B (KITS) ×3 IMPLANT
LOOP VESSEL MINI RED (MISCELLANEOUS) ×3 IMPLANT
NS IRRIG 1000ML POUR BTL (IV SOLUTION) ×6 IMPLANT
PACK PERIPHERAL VASCULAR (CUSTOM PROCEDURE TRAY) ×3 IMPLANT
PAD ARMBOARD 7.5X6 YLW CONV (MISCELLANEOUS) ×6 IMPLANT
PATCH VASC XENOSURE 1CMX6CM (Vascular Products) ×3 IMPLANT
PATCH VASC XENOSURE 1X6 (Vascular Products) ×2 IMPLANT
SUT MNCRL AB 4-0 PS2 18 (SUTURE) ×3 IMPLANT
SUT PROLENE 5 0 C 1 24 (SUTURE) ×6 IMPLANT
SUT PROLENE 6 0 BV (SUTURE) ×3 IMPLANT
SUT PROLENE 7 0 BV1 MDA (SUTURE) ×6 IMPLANT
SUT VIC AB 2-0 CT1 27 (SUTURE) ×6
SUT VIC AB 2-0 CT1 TAPERPNT 27 (SUTURE) ×4 IMPLANT
SUT VIC AB 3-0 SH 27 (SUTURE) ×3
SUT VIC AB 3-0 SH 27X BRD (SUTURE) ×2 IMPLANT
TOWEL GREEN STERILE (TOWEL DISPOSABLE) ×3 IMPLANT
TRAY FOLEY MTR SLVR 16FR STAT (SET/KITS/TRAYS/PACK) IMPLANT
UNDERPAD 30X36 HEAVY ABSORB (UNDERPADS AND DIAPERS) ×3 IMPLANT
WATER STERILE IRR 1000ML POUR (IV SOLUTION) ×3 IMPLANT

## 2019-10-10 NOTE — Transfer of Care (Signed)
Immediate Anesthesia Transfer of Care Note  Patient: Calen Labus  Procedure(s) Performed: RIGHT FEMORAL ENDARTERECTOMY (Right Groin) PATCH ANGIOPLASTY USING XENOSURE BIOLOGIC PATCH (Right )  Patient Location: PACU  Anesthesia Type:General  Level of Consciousness: awake and alert   Airway & Oxygen Therapy: Patient Spontanous Breathing and Patient connected to face mask oxygen  Post-op Assessment: Report given to RN and Post -op Vital signs reviewed and stable  Post vital signs: Reviewed and stable  Last Vitals:  Vitals Value Taken Time  BP 132/67 10/10/19 1421  Temp    Pulse 78 10/10/19 1423  Resp 10 10/10/19 1423  SpO2 98 % 10/10/19 1423  Vitals shown include unvalidated device data.  Last Pain:  Vitals:   10/10/19 1007  TempSrc:   PainSc: 4       Patients Stated Pain Goal: 3 (10/10/19 1007)  Complications: No complications documented.

## 2019-10-10 NOTE — Anesthesia Procedure Notes (Signed)
Procedure Name: Intubation Date/Time: 10/10/2019 12:21 PM Performed by: Pearson Grippe, CRNA Pre-anesthesia Checklist: Patient identified, Emergency Drugs available, Suction available and Patient being monitored Patient Re-evaluated:Patient Re-evaluated prior to induction Oxygen Delivery Method: Circle system utilized Preoxygenation: Pre-oxygenation with 100% oxygen Induction Type: IV induction Ventilation: Mask ventilation without difficulty Laryngoscope Size: Miller and 2 Grade View: Grade I Tube type: Oral Tube size: 7.0 mm Number of attempts: 1 Airway Equipment and Method: Stylet Placement Confirmation: ETT inserted through vocal cords under direct vision,  positive ETCO2 and breath sounds checked- equal and bilateral Secured at: 22 cm Tube secured with: Tape Dental Injury: Teeth and Oropharynx as per pre-operative assessment

## 2019-10-10 NOTE — Op Note (Signed)
Date: October 10, 2019  Preoperative diagnosis: High-grade right common femoral artery stenosis with right lower extremity short distance lifestyle limiting claudication  Postoperative diagnosis: Same  Procedure: Right common femoral artery endarterectomy including profundoplasty and bovine pericardial patch angioplasty  Surgeon: Dr. Cephus Shelling, MD  Assistant: Mosetta Pigeon, PA  Indications: Patient is a 64 year old female who was seen as an inpatient consult when she had lower extremity arteriogram and left leg intervention with Dr. Kirke Corin and was found to have a high-grade right common femoral artery stenosis and lower extremity lifestyle limiting claudication.  We subsequently recommended right common femoral endarterectomy and she presents today for surgery after risk benefits discussed.  An assistant was needed for exposure and to expedite the case.  Findings: Near occlusive calcified plaque in the right common femoral artery and endarterectomy was carried down to two profunda branches that were endarterectomized with eversion technique and the proximal SFA was endarterectomized and the true lumen was tacked down.  Patient had a palpable femoral pulse and right dorsalis pedis pulse after surgery.  Anesthesia: General  EBL: 100 mL  Details: Patient was taken to the operating room after informed consent was obtained.  Placed on operative table in supine position.  General endotracheal anesthesia was induced.  Right groin was then prepped draped usual sterile fashion.  Preop timeout was performed identify patient, procedure and site.  Initially made a vertical groin incision in the right groin over her calcified common femoral artery.  Dissected down with Bovie cautery and opened the subcutaneous tissue until I encountered the femoral sheath which was then opened longitudinally over the artery.  We initially dissected under the inguinal ligament with the help of appendiceal  retractor and got control of the circumflex branches with Vesseloops and also got a vessel loop on the distal external iliac where it was soft for clamping.  We then carried our dissection distally and got control to for profunda branches and SFA that were all dissected out bluntly and controlled Vesseloops.  Patient was given 100 units per kilogram heparin.  Ultimately I used Vesseloops on the SFA and profunda branches and Henley clamp on the distal external iliac.  Common femoral was opened with 11 blade scalpel extended with Potts scissors.  Common femoral was then endarterectomized with Nicholos Johns and I was able to carry the endarterectomy onto the distal external iliac and then down distal each profunda branch was pretty soft and the ostium was endarterectomized with eversion technique.  I then carried the dissection onto the proximal SFA given a large bulky calcified plaque year.  We had some trouble identifying the true lumen given a fairly diseased artery but once this was identified we tacked down the plaque remaining in the SFA distal to our endarterectomy with multiple 7-0 Prolene's.  At that point time we got backbleeding from all profunda branches in the SFA.  Bovine pericardial patch was brought on the field sewn in place with a 5-0 Prolene in parachute technique.  Everything was de-aired prior to completion.  Once we came off clamps we had excellent profunda signals and a palpable pulse in the SFA.  Patient was given 50 mg protamine for reversal.  She had a palpable pulse in her right dorsalis pedis.  The groin was irrigated.  The groin was closed in multiple layers of 2-0 Vicryl, 3-0 Vicryl, 4-0 Monocryl in the skin and Dermabond.  Taken to PACU in stable condition.  Complications: None  Condition: Stable  Cephus Shelling, MD  [facility] Office: [phone]   Cephus Shelling

## 2019-10-10 NOTE — Anesthesia Postprocedure Evaluation (Signed)
Anesthesia Post Note  Patient: Madalene Mcclintock  Procedure(s) Performed: RIGHT FEMORAL ENDARTERECTOMY (Right Groin) PATCH ANGIOPLASTY USING XENOSURE BIOLOGIC PATCH (Right )     Patient location during evaluation: PACU Anesthesia Type: General Level of consciousness: awake and alert Pain management: pain level controlled Vital Signs Assessment: post-procedure vital signs reviewed and stable Respiratory status: spontaneous breathing, nonlabored ventilation and respiratory function stable Cardiovascular status: blood pressure returned to baseline and stable Postop Assessment: no apparent nausea or vomiting Anesthetic complications: no   No complications documented.  Last Vitals:  Vitals:   10/10/19 1510 10/10/19 1525  BP:  128/82  Pulse: 75 81  Resp: 11 16  Temp: 37.2 C 36.7 C  SpO2: 98% 99%    Last Pain:  Vitals:   10/10/19 1525  TempSrc: Oral  PainSc: 2                  Delonda Coley,W. EDMOND

## 2019-10-10 NOTE — H&P (Signed)
History and Physical Interval Note:  10/10/2019 11:19 AM  [name]  has presented today for surgery, with the diagnosis of RIGHT FEMORAL ARTERY STENOSIS.  The various methods of treatment have been discussed with the patient and family. After consideration of risks, benefits and other options for treatment, the patient has consented to  Procedure(s): ENDARTERECTOMY FEMORAL (Right) as a surgical intervention.  The patient's history has been reviewed, patient examined, no change in status, stable for surgery.  I have reviewed the patient's chart and labs.  Questions were answered to the patient's satisfaction.    Right femoral endarterectomy.  Marty Heck  Patient name: [name]            MRN: [id number]        DOB: 15-Oct-1955          Sex: female  REASON FOR VISIT: Evaluate for right femoral endarterectomy  HPI: [name] is a 64 y.o. female with history of hypertension, hyperlipidemia, PAD, diabetes, coronary artery disease, pancreatitiesthat presents to further discuss right femoral endarterectomy.  Vascular surgeryhasinitially consulted for evaluation of right femoral endarterectomy in the hospital when she was under the care of Dr. Patton Salles underwent left SFA atherectomy for a focal lesionand was found to have high-grade right common femoral stenosis.  She reportsyears of lower extremity claudication in both legs.  Left leg is somewhat improved since recent left SFA intervention.  Her right leg is more bothersome.  Still is still having right calf claudication over short distances. She denies active tobacco abuse, but previously smoked. She was waiting for her right groin hematoma to resolve and this has since improved (after right transfemoral access for left leg intervention).   Past Medical History:  Diagnosis Date  . Asthma with COPD ([place])    PATIENT DENIES  . CAD (coronary artery disease)    a. [place] 2011: Diffuse distal and branch vessel  disease - patient managed medically, no interventional options. b. [place] 03/2014 - low risk, no ischemia.  . Complication of anesthesia    slow to wake up with last surgery in 2011   . Diabetes mellitus with circulatory complication ([place])   . Dyslipidemia   . Headache    hx of migraines   . Hypertension   . Osteoarthritis    severe right knee  R TKR  . PAD (peripheral artery disease) ([place])    a. Severe stenosis mid right SFA s/p atherectomy 03/25/09. b. peripheral angiography in 12/2011 which showed only about 50% diffuse right SFA stenosis.  . Pancreatitis 2003  . Renal artery stenosis ([place])    a. [place] 2011 - 40-50% left RAS.  . Tobacco use disorder    quit 11/10         Past Surgical History:  Procedure Laterality Date  . ABDOMINAL AORTAGRAM N/A 12/21/2011   Procedure: ABDOMINAL Maxcine Ham;  Surgeon: Wellington Hampshire, MD;  Location: [facility];  Service: Cardiovascular;  Laterality: N/A;  . ABDOMINAL AORTOGRAM W/LOWER EXTREMITY Bilateral 05/08/2019   Procedure: ABDOMINAL AORTOGRAM W/LOWER EXTREMITY;  Surgeon: Wellington Hampshire, MD;  Location: [facility];  Service: Cardiovascular;  Laterality: Bilateral;  . CARDIAC CATHETERIZATION    . ESOPHAGOGASTRODUODENOSCOPY (EGD) WITH PROPOFOL N/A 10/05/2017   Procedure: ESOPHAGOGASTRODUODENOSCOPY (EGD) WITH PROPOFOL;  Surgeon: Milus Banister, MD;  Location: [facility];  Service: Endoscopy;  Laterality: N/A;  . EUS N/A 10/05/2017   Procedure: UPPER ENDOSCOPIC ULTRASOUND (EUS) RADIAL;  Surgeon: Milus Banister, MD;  Location: [facility];  Service: Endoscopy;  Laterality: N/A;  . FOOT SURGERY Left   . LOWER EXTREMITY ANGIOGRAM Bilateral 05/27/2015   Procedure: Lower Extremity Angiogram;  Surgeon: Wellington Hampshire, MD;  Location: [facility];  Service: Cardiovascular;  Laterality: Bilateral;  . PERIPHERAL VASCULAR ATHERECTOMY Left 05/08/2019   Procedure: PERIPHERAL VASCULAR ATHERECTOMY;  Surgeon: Wellington Hampshire, MD;  Location: [facility];  Service: Cardiovascular;  Laterality: Left;  . PERIPHERAL VASCULAR CATHETERIZATION N/A 05/27/2015   Procedure: Abdominal Aortogram;  Surgeon: Wellington Hampshire, MD;  Location: [facility];  Service: Cardiovascular;  Laterality: N/A;  . PERIPHERAL VASCULAR CATHETERIZATION Right 05/27/2015   Procedure: Peripheral Vascular Intervention;  Surgeon: Wellington Hampshire, MD;  Location: [facility];  Service: Cardiovascular;  Laterality: Right;  SFA  . TOTAL KNEE ARTHROPLASTY Right 2011  . TOTAL KNEE ARTHROPLASTY Left 05/22/2014   Procedure: LEFT TOTAL KNEE ARTHROPLASTY;  Surgeon: Latanya Maudlin, MD;  Location: [facility];  Service: Orthopedics;  Laterality: Left;  . TUBAL LIGATION  1980    Family History  Problem Relation Age of Onset  . CVA Mother   . Emphysema Father   . Breast cancer Sister 9    SOCIAL HISTORY: Social History        Tobacco Use  . Smoking status: Former Smoker    Quit date: 12/13/2008    Years since quitting: 10.7  . Smokeless tobacco: Never Used  Substance Use Topics  . Alcohol use: No    Comment: Quit drinking around year 2000         Allergies  Allergen Reactions  . Gabapentin Other (See Comments)    Brain shakes  . Glucotrol [Glipizide] Rash and Other (See Comments)    Red rash  . Influenza Vaccines Other (See Comments)    Significant arm soreness requiring 1 year of physical therapy  . Latex Rash  . Nortriptyline Other (See Comments)    BRAIN SHAKE  . Tetanus Toxoid Hives, Swelling, Rash and Other (See Comments)    Swelling to site of injection with rash and fever  . Invokana [Canagliflozin] Other (See Comments)    Yeast Infections          Current Outpatient Medications  Medication Sig Dispense Refill  . acetaminophen (TYLENOL) 500 MG tablet Take 1,000 mg by mouth every 6 (six) hours as needed for moderate pain or headache.    . ADVAIR DISKUS 250-50 MCG/DOSE  AEPB INHALE 1 PUFF BY MOUTH INTO THE LUNGS EVERY 12 HOURS (Patient taking differently: Inhale 1 puff into the lungs 2 (two) times daily. ) 60 each 5  . aspirin 81 MG tablet Take 81 mg by mouth daily.      . Biotin w/ Vitamins C & E (HAIR/SKIN/NAILS PO) Take 1 tablet by mouth daily.    . bisoprolol (ZEBETA) 5 MG tablet TAKE ONE-HALF TABLET BY MOUTH EVERY DAY (Patient taking differently: Take 2.5 mg by mouth at bedtime. ) 90 tablet 3  . Blood Glucose Monitoring Suppl (BAYER CONTOUR MONITOR) w/Device KIT CHECK BLOOD SUGAR EVERY DAY 1 kit 0  . Blood Glucose Monitoring Suppl (ONE TOUCH ULTRA 2) w/Device KIT Check BS daily 1 kit 0  . clopidogrel (PLAVIX) 75 MG tablet Take 1 tablet (75 mg total) by mouth daily. 30 tablet 11  . CONTOUR NEXT TEST test strip CHECK BLOOD SUGAR DAILY AS DIRECTED 100 strip 3  . CREON 36000 units CPEP capsule TAKE 2 CAPSULES BY MOUTH THREE TIMES DAILY BEFORE MEALS (  Patient taking differently: Take 108,000 Units by mouth 3 (three) times daily before meals. ) 180 capsule 0  . diclofenac sodium (VOLTAREN) 1 % GEL Apply 2 g topically 4 (four) times daily. (Patient taking differently: Apply 2 g topically 4 (four) times daily as needed (for hand pain). ) 100 g 0  . glucose blood (ONETOUCH ULTRA) test strip Check BS daily  DX: E11.9 100 each 12  . HYDROcodone-acetaminophen (NORCO/VICODIN) 5-325 MG tablet Take 1 tablet by mouth every 6 (six) hours as needed for moderate pain. Pain Management, [place] 130 tablet 0  . isosorbide mononitrate (IMDUR) 60 MG 24 hr tablet TAKE 1 TABLET BY MOUTH DAILY (Patient taking differently: Take 60 mg by mouth daily. ) 90 tablet 2  . Lancet Devices (ONE TOUCH DELICA LANCING DEV) MISC Use daily DxE11.59 100 each 3  . Lancets (ONETOUCH DELICA PLUS TOIZTI45Y) MISC 1 Units by Other route daily. 100 each 3  . metFORMIN (GLUCOPHAGE) 1000 MG tablet TAKE 1 TABLET BY MOUTH TWICE DAILY WITH MEALS (Patient taking differently: Take 1,000 mg by mouth in the  morning and at bedtime. ) 180 tablet 0  . methocarbamol (ROBAXIN) 500 MG tablet TAKE 1 TABLET BY MOUTH EVERY 6 HOURS AS NEEDED FOR MUSCLE SPASMS (Patient taking differently: Take 500 mg by mouth every 6 (six) hours as needed for muscle spasms. ) 120 tablet 2  . Multiple Vitamin (MULTIVITAMIN) tablet Take 1 tablet by mouth daily.      . nitroGLYCERIN (NITROSTAT) 0.4 MG SL tablet Place 1 tablet under the tongue every 5 (five) minutes as needed for chest pain.    Marland Kitchen ondansetron (ZOFRAN) 4 MG tablet TAKE 1 TABLET BY MOUTH EVERY 8 HOURS AS NEEDED FOR NAUSEA OR VOMITING (Patient taking differently: Take 4 mg by mouth daily. ) 30 tablet 2  . pantoprazole (PROTONIX) 40 MG tablet TAKE 1 TABLET BY MOUTH DAILY 90 tablet 3  . pioglitazone (ACTOS) 30 MG tablet Take 1 tablet (30 mg total) by mouth daily. 90 tablet 3  . Probiotic Product (TRUBIOTICS PO) Take 1 capsule by mouth daily.    . rosuvastatin (CRESTOR) 40 MG tablet Take 1 tablet (40 mg total) by mouth at bedtime. 90 tablet 3  . VENTOLIN HFA 108 (90 Base) MCG/ACT inhaler INHALE 2 PUFFS BY MOUTH TWICE DAILY AS NEEDED FOR WHEEZING OR SHORTNESS OF BREATH (Patient taking differently: Inhale 1 puff into the lungs 2 (two) times daily as needed for wheezing or shortness of breath. ) 18 g 3   No current facility-administered medications for this visit.    REVIEW OF SYSTEMS:  [X]  denotes positive finding, [ ]  denotes negative finding Cardiac  Comments:  Chest pain or chest pressure:    Shortness of breath upon exertion:    Short of breath when lying flat:    Irregular heart rhythm:        Vascular    Pain in calf, thigh, or hip brought on by ambulation: x Right calf  Pain in feet at night that wakes you up from your sleep:     Blood clot in your veins:    Leg swelling:         Pulmonary    Oxygen at home:    Productive cough:     Wheezing:         Neurologic    Sudden weakness in arms or legs:      Sudden numbness in arms or legs:     Sudden onset of difficulty speaking  or slurred speech:    Temporary loss of vision in one eye:     Problems with dizziness:         Gastrointestinal    Blood in stool:     Vomited blood:         Genitourinary    Burning when urinating:     Blood in urine:        Psychiatric    Major depression:         Hematologic    Bleeding problems:    Problems with blood clotting too easily:        Skin    Rashes or ulcers:        Constitutional    Fever or chills:      PHYSICAL EXAM:    Vitals:   09/10/19 1114  BP: (!) 140/77  Pulse: 87  Resp: 16  Temp: (!) 97.5 F (36.4 C)  TempSrc: Temporal  SpO2: 100%  Weight: 147 lb (66.7 kg)  Height: 5' 2.5" (1.588 m)    GENERAL: The patient is a well-nourished female, in no acute distress. The vital signs are documented above. CARDIAC: There is a regular rate and rhythm.  VASCULAR:  Left femoral 2+ palpable Left DP palpable Right femoral weaker but palpable No palpable right pedal pulses PULMONARY: There is good air exchange bilaterally without wheezing or rales. ABDOMEN: Soft and non-tender with normal pitched bowel sounds.  MUSCULOSKELETAL: There are no major deformities or cyanosis. NEUROLOGIC: No focal weakness or paresthesias are detected. SKIN: There are no ulcers or rashes noted. PSYCHIATRIC: The patient has a normal affect.  DATA:   Reviewed previous arteriogram that shows high-grade right common femoral artery stenosis with diffusely diseased SFA and three-vessel runoff.  Assessment/Plan:  64 year old female presents for follow-up of her right calf claudication in the setting of high-grade common femoral artery stenosis.  I was initially consulted in the hospital and offered her common femoral endarterectomy at her convenience.  Ultimately her transfemoral access in the right groin by Dr. Fletcher Anon was complicated by  hematoma and she wanted this to resolve before having surgery.  On evaluation today her right groin looks very good and I do not see any evidence of residual hematoma.  She still having right calf claudication symptoms.  Discussed right femoral endarterectomy including risks and benefits.  She wishes to get scheduled in the mid August assuming she is not have any flares of her pancreatitis.   Marty Heck, MD [facility] Office: [phone]

## 2019-10-10 NOTE — Progress Notes (Signed)
   Doing well Right groin soft without hematoma Palpable PT pulse right LE  S/P right femoral endarterectomy Stable disposition Mosetta Pigeon PA-C

## 2019-10-10 NOTE — Discharge Instructions (Signed)
 [facility]  Discharge instructions  Lower Extremity Bypass Surgery  Please refer to the following instruction for your post-procedure care. Your surgeon or physician assistant will discuss any changes with you.  Activity  You are encouraged to walk as much as you can. You can slowly return to normal activities during the month after your surgery. Avoid strenuous activity and heavy lifting until your doctor tells you it's OK. Avoid activities such as vacuuming or swinging a golf club. Do not drive until your doctor give the OK and you are no longer taking prescription pain medications. It is also normal to have difficulty with sleep habits, eating and bowel movement after surgery. These will go away with time.  Bathing/Showering  You may shower after you go home. Do not soak in a bathtub, hot tub, or swim until the incision heals completely.  Incision Care  Clean your incision with mild soap and water. Shower every day. Pat the area dry with a clean towel. You do not need a bandage unless otherwise instructed. Do not apply any ointments or creams to your incision. If you have open wounds you will be instructed how to care for them or a visiting nurse may be arranged for you. If you have staples or sutures along your incision they will be removed at your post-op appointment. You may have skin glue on your incision. Do not peel it off. It will come off on its own in about one week. If you have a great deal of moisture in your groin, use a gauze help keep this area dry.  Diet  Resume your normal diet. There are no special food restrictions following this procedure. A low fat/ low cholesterol diet is recommended for all patients with vascular disease. In order to heal from your surgery, it is CRITICAL to get adequate nutrition. Your body requires vitamins, minerals, and protein. Vegetables are the best source of vitamins and minerals. Vegetables also provide the  perfect balance of protein. Processed food has little nutritional value, so try to avoid this.  Medications  Resume taking all your medications unless your doctor or nurse practitioner tells you not to. If your incision is causing pain, you may take over-the-counter pain relievers such as acetaminophen (Tylenol). If you were prescribed a stronger pain medication, please aware these medication can cause nausea and constipation. Prevent nausea by taking the medication with a snack or meal. Avoid constipation by drinking plenty of fluids and eating foods with high amount of fiber, such as fruits, vegetables, and grains. Take Colase 100 mg (an over-the-counter stool softener) twice a day as needed for constipation. Do not take Tylenol if you are taking prescription pain medications.  Follow Up  Our office will schedule a follow up appointment 2-3 weeks following discharge.  Please call us immediately for any of the following conditions  Severe or worsening pain in your legs or feet while at rest or while walking Increase pain, redness, warmth, or drainage (pus) from your incision site(s) Fever of 101 degree or higher The swelling in your leg with the bypass suddenly worsens and becomes more painful than when you were in the hospital If you have been instructed to feel your graft pulse then you should do so every day. If you can no longer feel this pulse, call the office immediately. Not all patients are given this instruction.  Leg swelling is common after leg bypass surgery.  The swelling should improve over a few months   following surgery. To improve the swelling, you may elevate your legs above the level of your heart while you are sitting or resting. Your surgeon or physician assistant may ask you to apply an ACE wrap or wear compression (TED) stockings to help to reduce swelling.  Reduce your risk of vascular disease  Stop smoking. If you would like help call [facility] at 1-800-QUIT-NOW  (1-800-784-8669) or [facility] at 336-586-4000.  Manage your cholesterol Maintain a desired weight Control your diabetes weight Control your diabetes Keep your blood pressure down  If you have any questions, please call the office at [phone]   

## 2019-10-11 ENCOUNTER — Telehealth: Payer: Self-pay | Admitting: *Deleted

## 2019-10-11 ENCOUNTER — Encounter (HOSPITAL_COMMUNITY): Payer: Self-pay | Admitting: Vascular Surgery

## 2019-10-11 LAB — LIPID PANEL
Cholesterol: 151 mg/dL (ref 0–200)
HDL: 59 mg/dL (ref >40)
LDL Cholesterol: 57 mg/dL (ref 0–99)
Total CHOL/HDL Ratio: 2.6 RATIO
Triglycerides: 174 mg/dL — ABNORMAL HIGH (ref <150)
VLDL: 35 mg/dL (ref 0–40)

## 2019-10-11 LAB — CBC
HCT: 37.9 % (ref 36.0–46.0)
Hemoglobin: 11.7 g/dL — ABNORMAL LOW (ref 12.0–15.0)
MCH: 27.9 pg (ref 26.0–34.0)
MCHC: 30.9 g/dL (ref 30.0–36.0)
MCV: 90.2 fL (ref 80.0–100.0)
Platelets: 275 10*3/uL (ref 150–400)
RBC: 4.2 MIL/uL (ref 3.87–5.11)
RDW: 15.5 % (ref 11.5–15.5)
WBC: 5.8 10*3/uL (ref 4.0–10.5)
nRBC: 0 % (ref 0.0–0.2)

## 2019-10-11 LAB — BASIC METABOLIC PANEL
Anion gap: 11 (ref 5–15)
BUN: 19 mg/dL (ref 8–23)
CO2: 24 mmol/L (ref 22–32)
Calcium: 9.7 mg/dL (ref 8.9–10.3)
Chloride: 105 mmol/L (ref 98–111)
Creatinine, Ser: 0.98 mg/dL (ref 0.44–1.00)
GFR calc Af Amer: >60 mL/min (ref >60)
GFR calc non Af Amer: >60 mL/min (ref >60)
Glucose, Bld: 122 mg/dL — ABNORMAL HIGH (ref 70–99)
Potassium: 4.2 mmol/L (ref 3.5–5.1)
Sodium: 140 mmol/L (ref 135–145)

## 2019-10-11 LAB — GLUCOSE, CAPILLARY: Glucose-Capillary: 100 mg/dL — ABNORMAL HIGH (ref 70–99)

## 2019-10-11 NOTE — Progress Notes (Addendum)
[facility]  Subjective  - doing well ambulated and pain controlled   Objective 136/85 74 97.8 F (36.6 C) (Oral) 16 100%  Intake/Output Summary (Last 24 hours) at 10/11/2019 0751 Last data filed at 10/11/2019 0600 Gross per 24 hour  Intake 2020 ml  Output 50 ml  Net 1970 ml    Palpable pedal pulses, right groin soft, tender to palpation without hematoma Heart RRR Lungs non labored breathing  Assessment/Planning: POD # right fem endart.  Patent flow with palpable pedal pulses Plan to discharge home F/u in 2-3 weeks with Dr. Tonye Becket 10/11/2019 7:51 AM --  Laboratory Lab Results: Recent Labs    10/10/19 1622 10/11/19 0219  WBC 6.1 5.8  HGB 11.3* 11.7*  HCT 37.2 37.9  PLT 247 275   BMET Recent Labs    10/10/19 1622 10/11/19 0219  NA  --  140  K  --  4.2  CL  --  105  CO2  --  24  GLUCOSE  --  122*  BUN  --  19  CREATININE 1.00 0.98  CALCIUM  --  9.7    COAG Lab Results  Component Value Date   INR 0.9 10/01/2019   INR 1.03 05/27/2015   INR 0.88 05/13/2014   No results found for: PTT   I have seen and evaluated the patient. I agree with the PA note as documented above.  Postop day 1 status post right common femoral endarterectomy.  Palpable right dorsalis pedis pulse.  Right groin looks good.  Has been out of bed and ambulating and pain is controlled.  We will plan for discharge today and she is comfortable with this.  Will arrange follow-up in 3 to 4 weeks for groin check.  Continue aspirin.  She is getting chronic narcotics through her PCP so will not give her any narcotics prescription at this time and she states she has enough at home.  Discussed that she keep the groin clean and dry with a dry gauze.  Cephus Shelling, MD [facility] Office: [phone]

## 2019-10-11 NOTE — Evaluation (Addendum)
Physical Therapy Evaluation/ Discharge Patient Details Name: [name] MRN: [id number] DOB: March 08, 1955 Today's Date: 10/11/2019   History of Present Illness  64 yo admitted for right femoral endarterectomy. PMHx: bil TKA, HTN, HLD, PAD, DM, CAD, COPD, pancreatitis  Clinical Impression  Pt very pleasant moving well and reports getting up and down to the bathroom and hall. Pt with limited soreness at incision and reports being active and walking daily. Pt educated for continued walking program, HEP and ROM to maintain function. All education complete and pt at baseline functional level without need for further therapy with pt aware and agreeable.      Follow Up Recommendations No PT follow up    Equipment Recommendations  None recommended by PT    Recommendations for Other Services       Precautions / Restrictions Precautions Precautions: None      Mobility  Bed Mobility Overal bed mobility: Modified Independent                Transfers Overall transfer level: Modified independent                  Ambulation/Gait Ambulation/Gait assistance: Independent Gait Distance (Feet): 500 Feet Assistive device: None Gait Pattern/deviations: WFL(Within Functional Limits)   Gait velocity interpretation: >4.37 ft/sec, indicative of normal walking speed    Stairs Stairs: Yes Stairs assistance: Modified independent (Device/Increase time) Stair Management: Step to pattern;Forwards;One rail Left Number of Stairs: 7 General stair comments: pt able to complete stairs with assist of rail in modified pattern due to bil TKA with limited ROM  Wheelchair Mobility    Modified Rankin (Stroke Patients Only)       Balance Overall balance assessment: No apparent balance deficits (not formally assessed)                                           Pertinent Vitals/Pain Pain Assessment: No/denies pain    Home Living Family/patient expects to be  discharged to:: Private residence Living Arrangements: Spouse/significant other Available Help at Discharge: Family;Available 24 hours/day Type of Home: House Home Access: Stairs to enter   [employer] of Steps: 2 Home Layout: One level Home Equipment: Walker - 2 wheels;Bedside commode;Cane - single point;Shower seat      Prior Function Level of Independence: Independent               Hand Dominance        Extremity/Trunk Assessment   Upper Extremity Assessment Upper Extremity Assessment: Overall WFL for tasks assessed    Lower Extremity Assessment Lower Extremity Assessment: RLE deficits/detail;LLE deficits/detail RLE Deficits / Details: limited knee flexion from TKA grossly 95 degrees LLE Deficits / Details: knee flexion grossly 75 degrees limited from prior TKA    Cervical / Trunk Assessment Cervical / Trunk Assessment: Normal  Communication   Communication: No difficulties  Cognition Arousal/Alertness: Awake/alert Behavior During Therapy: WFL for tasks assessed/performed Overall Cognitive Status: Within Functional Limits for tasks assessed                                        General Comments      Exercises General Exercises - Lower Extremity Long Arc Quad: AROM;Right;Seated;15 reps Hip Flexion/Marching: AROM;Right;Seated;20 reps   Assessment/Plan    PT Assessment Patent does not  need any further PT services  PT Problem List         PT Treatment Interventions      PT Goals (Current goals can be found in the Care Plan section)  Acute Rehab PT Goals PT Goal Formulation: All assessment and education complete, DC therapy    Frequency     Barriers to discharge        Co-evaluation               AM-PAC PT "6 Clicks" Mobility  Outcome Measure Help needed turning from your back to your side while in a flat bed without using bedrails?: None Help needed moving from lying on your back to sitting on the side of a  flat bed without using bedrails?: None Help needed moving to and from a bed to a chair (including a wheelchair)?: None Help needed standing up from a chair using your arms (e.g., wheelchair or bedside chair)?: None Help needed to walk in hospital room?: None Help needed climbing 3-5 steps with a railing? : None 6 Click Score: 24    End of Session   Activity Tolerance: Patient tolerated treatment well Patient left: in chair;with call bell/phone within reach Nurse Communication: Mobility status PT Visit Diagnosis: Other abnormalities of gait and mobility (R26.89)    Time: 0454-0981 PT Time Calculation (min) (ACUTE ONLY): 19 min   Charges:   PT Evaluation $PT Eval Low Complexity: 1 Low          Annya Lizana P, PT Acute Rehabilitation Services Pager: [phone] Office: [phone]   Enedina Finner Frieda Arnall 10/11/2019, 10:55 AM

## 2019-10-11 NOTE — Telephone Encounter (Signed)
Received request from pharmacy for PA on Zofran.   PA submitted.   Dx: N/V, Chronic pancreatitis.   This request is still being processed. You may close this dialog, return to your dashboard, and perform other tasks. To check for an update later, open this request again from your dashboard. If you have any questions, please contact [facility] at [phone].

## 2019-10-15 NOTE — Discharge Summary (Signed)
[facility] Discharge Summary   Patient ID:  [name] MRN: [id number] DOB/AGE: 62-03-1955 62 y.o.  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 Asthma with COPD ([place])    PATIENT DENIES  . CAD (coronary artery disease)    a. [place] 2011: Diffuse distal and branch vessel disease - patient managed medically, no interventional options. b. Nuc 03/2014 - low risk, no ischemia.  . Complication of anesthesia    slow to wake up with last surgery in 2011   . Diabetes mellitus with circulatory complication ([place])   . Dyslipidemia   . Headache    hx of migraines   . Hypertension   . Osteoarthritis    severe right knee  R TKR  . PAD (peripheral artery disease) ([place])    a. Severe stenosis mid right SFA s/p atherectomy 03/25/09. b. peripheral angiography in 12/2011 which showed only about 50% diffuse right SFA stenosis.  . Pancreatitis 2003  . Renal artery stenosis ([place])    a. [place] 2011 - 40-50% left RAS.  . Tobacco use disorder    quit 11/10    Procedure(s): RIGHT FEMORAL ENDARTERECTOMY PATCH ANGIOPLASTY USING XENOSURE BIOLOGIC PATCH  Discharged Condition: good  HQP:RFFMB Edwardsis a 64 y.o.femalewith history of PAD B LE She reportsyears of lower extremity claudication in both legs.Left leg is somewhat improved since recent left SFA intervention. Her right leg is more bothersome. Still is stillhaving right calf claudication over short distances.  Reviewed previous arteriogram that shows high-grade right common femoral artery stenosis with diffusely diseased SFA and three-vessel runoff.  Hospital  Course:  [name] is a 64 y.o. female is S/P  Procedure(s): RIGHT FEMORAL ENDARTERECTOMY PATCH ANGIOPLASTY USING [place]  She did well over night.  Voiding, ambulated, tolerating PO's.  Patent bypass with palpable pedal pulse.  Discharge home today with f/u in 2-3 weeks.   Significant Diagnostic Studies: CBC Lab Results  Component Value Date   WBC 5.8 10/11/2019   HGB 11.7 (L) 10/11/2019   HCT 37.9 10/11/2019   MCV 90.2 10/11/2019   PLT 275 10/11/2019    BMET    Component Value Date/Time   NA 140 10/11/2019 0219   NA 137 05/03/2019 1011   K 4.2 10/11/2019 0219   CL 105 10/11/2019 0219   CO2 24 10/11/2019 0219   GLUCOSE 122 (H) 10/11/2019 0219   BUN 19 10/11/2019 0219   BUN 21 05/03/2019 1011   CREATININE 0.98 10/11/2019 0219   CREATININE 0.90 02/18/2019 1121   CALCIUM 9.7 10/11/2019 0219   GFRNONAA >60 10/11/2019 0219   GFRNONAA 57 (L) 01/25/2018 1204   GFRAA >60 10/11/2019 0219   GFRAA 66 01/25/2018 1204   COAG Lab Results  Component Value Date   INR 0.9 10/01/2019   INR 1.03 05/27/2015   INR 0.88 05/13/2014     Disposition:  Discharge to :Home Discharge Instructions    Call MD for:  redness, tenderness, or signs of infection (pain, swelling, bleeding, redness, odor or green/yellow discharge around incision site)   Complete by: As directed    Call MD for:  severe or increased pain, loss or decreased feeling  in affected limb(s)   Complete by: As directed  Call MD for:  temperature >100.5   Complete by: As directed    Resume previous diet   Complete by: As directed      Allergies as of 10/11/2019      Reactions   Gabapentin Other (See Comments)   Brain shakes   Glucotrol [glipizide] Rash, Other (See Comments)   Red rash   Influenza Vaccines Other (See Comments)   Significant arm soreness requiring 1 year of physical therapy   Latex Rash   Nortriptyline Other (See Comments)   BRAIN SHAKE   Tetanus Toxoid Hives, Swelling,  Rash, Other (See Comments)   Swelling to site of injection with rash and fever   Other Itching, Other (See Comments), Cough   Patient is highly allergic to Cats and she begins to break out in rash and turns red if someone caring for her has cats at home.  Patient takes Claritin for this type of reaction.  Patient stated that if it goes untreated then he begins to have difficulty breathing   Invokana [canagliflozin] Other (See Comments)   Yeast Infections      Medication List    TAKE these medications   acetaminophen 500 MG tablet Commonly known as: TYLENOL Take 1,000 mg by mouth every 6 (six) hours as needed for moderate pain or headache.   Advair Diskus 250-50 MCG/DOSE Aepb Generic drug: Fluticasone-Salmeterol INHALE 1 PUFF BY MOUTH INTO THE LUNGS EVERY 12 HOURS What changed: See the new instructions.   aspirin 81 MG tablet Take 81 mg by mouth daily.   Landscape architect w/Device Kit CHECK BLOOD SUGAR EVERY DAY   ONE TOUCH ULTRA 2 w/Device Kit Check BS daily   bisoprolol 5 MG tablet Commonly known as: ZEBETA TAKE ONE-HALF TABLET BY MOUTH EVERY DAY What changed: when to take this   clopidogrel 75 MG tablet Commonly known as: PLAVIX Take 1 tablet (75 mg total) by mouth daily.   Contour Next Test test strip Generic drug: glucose blood CHECK BLOOD SUGAR DAILY AS DIRECTED   OneTouch Ultra test strip Generic drug: glucose blood Check BS daily  DX: E11.9   Creon 36000 UNITS Cpep capsule Generic drug: lipase/protease/amylase TAKE 2 CAPSULES BY MOUTH THREE TIMES DAILY BEFORE MEALS What changed: See the new instructions.   diclofenac sodium 1 % Gel Commonly known as: Voltaren Apply 2 g topically 4 (four) times daily. What changed:   when to take this  reasons to take this   HAIR/SKIN/NAILS PO Take 1 tablet by mouth daily.   HYDROcodone-acetaminophen 5-325 MG tablet Commonly known as: NORCO/VICODIN Take 1 tablet by mouth every 6 (six) hours as needed for  moderate pain. Pain [facility], [place]   isosorbide mononitrate 60 MG 24 hr tablet Commonly known as: IMDUR TAKE 1 TABLET BY MOUTH DAILY   metFORMIN 1000 MG tablet Commonly known as: GLUCOPHAGE TAKE 1 TABLET BY MOUTH TWICE DAILY WITH MEALS What changed:   when to take this  additional instructions   methocarbamol 500 MG tablet Commonly known as: ROBAXIN TAKE 1 TABLET BY MOUTH EVERY 6 HOURS AS NEEDED FOR MUSCLE SPASMS What changed: See the new instructions.   multivitamin tablet Take 1 tablet by mouth daily.   nitroGLYCERIN 0.4 MG SL tablet Commonly known as: NITROSTAT Place 1 tablet under the tongue every 5 (five) minutes as needed for chest pain.   ondansetron 4 MG tablet Commonly known as: ZOFRAN TAKE 1 TABLET BY MOUTH EVERY 8 HOURS AS NEEDED FOR NAUSEA OR VOMITING What changed:  how much to take  how to take this  when to take this  reasons to take this  additional instructions   ONE TOUCH DELICA LANCING DEV Misc Use daily AFO25.52   OneTouch Delica Plus [id number] Misc 1 Units by Other route daily.   pantoprazole 40 MG tablet Commonly known as: PROTONIX TAKE 1 TABLET BY MOUTH DAILY What changed: when to take this   pioglitazone 30 MG tablet Commonly known as: Actos Take 1 tablet (30 mg total) by mouth daily.   rosuvastatin 40 MG tablet Commonly known as: CRESTOR Take 1 tablet (40 mg total) by mouth at bedtime.   TRUBIOTICS PO Take 1 capsule by mouth daily.   Ventolin HFA 108 (90 Base) MCG/ACT inhaler Generic drug: albuterol INHALE 2 PUFFS BY MOUTH TWICE DAILY AS NEEDED FOR WHEEZING OR SHORTNESS OF BREATH What changed: See the new instructions.      Verbal and written Discharge instructions given to the patient. Wound care per Discharge AVS  Follow-up Information    Marty Heck, MD In 2 weeks.   Specialty: Vascular Surgery Why: Office will call you to arrange your appt (sent) Contact information: [place]  [place] [address] [phone]               Signed: Roxy Horseman 10/15/2019, 10:38 AM

## 2019-10-24 NOTE — Telephone Encounter (Signed)
Call placed to insurance to F/U.   Was advised that PA has been denied.

## 2019-10-28 ENCOUNTER — Other Ambulatory Visit: Payer: Self-pay | Admitting: Family Medicine

## 2019-10-28 NOTE — Telephone Encounter (Signed)
Patient called requesting a refill on her hydrocodone called into [facility] on [address]   CB# [phone]

## 2019-10-28 NOTE — Telephone Encounter (Signed)
Ok to refill??  Last office visit 05/14/2019.  Last refill 10/01/2019.

## 2019-10-29 ENCOUNTER — Other Ambulatory Visit: Payer: Self-pay

## 2019-10-29 ENCOUNTER — Ambulatory Visit (INDEPENDENT_AMBULATORY_CARE_PROVIDER_SITE_OTHER): Payer: Self-pay | Admitting: Vascular Surgery

## 2019-10-29 ENCOUNTER — Encounter: Payer: Self-pay | Admitting: Vascular Surgery

## 2019-10-29 VITALS — BP 124/59 | HR 82 | Temp 97.3°F | Resp 14 | Ht 63.0 in | Wt 149.0 lb

## 2019-10-29 DIAGNOSIS — I70209 Unspecified atherosclerosis of native arteries of extremities, unspecified extremity: Secondary | ICD-10-CM

## 2019-10-29 DIAGNOSIS — I739 Peripheral vascular disease, unspecified: Secondary | ICD-10-CM

## 2019-10-29 MED ORDER — HYDROCODONE-ACETAMINOPHEN 5-325 MG PO TABS
1.0000 | ORAL_TABLET | Freq: Four times a day (QID) | ORAL | 0 refills | Status: DC | PRN
Start: 2019-10-29 — End: 2019-11-22

## 2019-10-29 NOTE — Progress Notes (Signed)
Patient name: [name] MRN: [id number] DOB: Jul 06, 1955 Sex: female  REASON FOR VISIT: Postop check after right femoral endarterectomy   HPI: [name] is a 64 y.o. female with multiple medical comorbidities listed below the presents for postop check.  She recently underwent a right common femoral endarterectomy with profundoplasty and bovine patch on 10/10/2019 for right leg claudication.  On follow-up today she states the groin incision has healed without issue.  She has made some good progress and her leg pain has improved with walking but not completely.  She states she is goingto make an appointment to follow-up with Dr. Fletcher Anon shortly who has been following her PVD.  Past Medical History:  Diagnosis Date  . Asthma with COPD ([place])    PATIENT DENIES  . CAD (coronary artery disease)    a. [place] 2011: Diffuse distal and branch vessel disease - patient managed medically, no interventional options. b. Nuc 03/2014 - low risk, no ischemia.  . Complication of anesthesia    slow to wake up with last surgery in 2011   . Diabetes mellitus with circulatory complication ([place])   . Dyslipidemia   . Headache    hx of migraines   . Hypertension   . Osteoarthritis    severe right knee  R TKR  . PAD (peripheral artery disease) ([place])    a. Severe stenosis mid right SFA s/p atherectomy 03/25/09. b. peripheral angiography in 12/2011 which showed only about 50% diffuse right SFA stenosis.  . Pancreatitis 2003  . Renal artery stenosis ([place])    a. [place] 2011 - 40-50% left RAS.  . Tobacco use disorder    quit 11/10    Past Surgical History:  Procedure Laterality Date  . ABDOMINAL AORTAGRAM N/A 12/21/2011   Procedure: ABDOMINAL Maxcine Ham;  Surgeon: Wellington Hampshire, MD;  Location: [facility];  Service: Cardiovascular;  Laterality: N/A;  . ABDOMINAL AORTOGRAM W/LOWER EXTREMITY Bilateral 05/08/2019   Procedure: ABDOMINAL AORTOGRAM W/LOWER EXTREMITY;  Surgeon: Wellington Hampshire, MD;  Location: [facility] CV [facility];  Service: Cardiovascular;  Laterality: Bilateral;  . CARDIAC CATHETERIZATION    . ENDARTERECTOMY FEMORAL Right 10/10/2019   Procedure: RIGHT FEMORAL ENDARTERECTOMY;  Surgeon: Marty Heck, MD;  Location: [facility];  Service: Vascular;  Laterality: Right;  . ESOPHAGOGASTRODUODENOSCOPY (EGD) WITH PROPOFOL N/A 10/05/2017   Procedure: ESOPHAGOGASTRODUODENOSCOPY (EGD) WITH PROPOFOL;  Surgeon: Milus Banister, MD;  Location: [facility];  Service: Endoscopy;  Laterality: N/A;  . EUS N/A 10/05/2017   Procedure: UPPER ENDOSCOPIC ULTRASOUND (EUS) RADIAL;  Surgeon: Milus Banister, MD;  Location: [facility];  Service: Endoscopy;  Laterality: N/A;  . FOOT SURGERY Left   . LOWER EXTREMITY ANGIOGRAM Bilateral 05/27/2015   Procedure: Lower Extremity Angiogram;  Surgeon: Wellington Hampshire, MD;  Location: [facility] CV LAB;  Service: Cardiovascular;  Laterality: Bilateral;  . PATCH ANGIOPLASTY Right 10/10/2019   Procedure: PATCH ANGIOPLASTY USING [place];  Surgeon: Marty Heck, MD;  Location: [facility];  Service: Vascular;  Laterality: Right;  . PERIPHERAL VASCULAR ATHERECTOMY Left 05/08/2019   Procedure: PERIPHERAL VASCULAR ATHERECTOMY;  Surgeon: Wellington Hampshire, MD;  Location: [facility] CV [facility];  Service: Cardiovascular;  Laterality: Left;  . PERIPHERAL VASCULAR CATHETERIZATION N/A 05/27/2015   Procedure: Abdominal Aortogram;  Surgeon: Wellington Hampshire, MD;  Location: [facility] CV LAB;  Service: Cardiovascular;  Laterality: N/A;  . PERIPHERAL VASCULAR CATHETERIZATION Right 05/27/2015   Procedure: Peripheral Vascular Intervention;  Surgeon: Wellington Hampshire, MD;  Location: [facility];  Service: Cardiovascular;  Laterality: Right;  SFA  . TOTAL KNEE ARTHROPLASTY Right 2011  . TOTAL KNEE ARTHROPLASTY Left 05/22/2014   Procedure: LEFT TOTAL KNEE ARTHROPLASTY;  Surgeon: Latanya Maudlin, MD;  Location: [facility];  Service: Orthopedics;  Laterality: Left;  . TUBAL  LIGATION  1980    Family History  Problem Relation Age of Onset  . CVA Mother   . Emphysema Father   . Breast cancer Sister 65    SOCIAL HISTORY: Social History   Tobacco Use  . Smoking status: Former Smoker    Quit date: 12/13/2008    Years since quitting: 10.8  . Smokeless tobacco: Never Used  Substance Use Topics  . Alcohol use: No    Comment: Quit drinking around year 2000    Allergies  Allergen Reactions  . Gabapentin Other (See Comments)    Brain shakes  . Glucotrol [Glipizide] Rash and Other (See Comments)    Red rash  . Influenza Vaccines Other (See Comments)    Significant arm soreness requiring 1 year of physical therapy  . Latex Rash  . Nortriptyline Other (See Comments)    BRAIN SHAKE  . Tetanus Toxoid Hives, Swelling, Rash and Other (See Comments)    Swelling to site of injection with rash and fever  . Other Itching, Other (See Comments) and Cough    Patient is highly allergic to Cats and she begins to break out in rash and turns red if someone caring for her has cats at home.  Patient takes Claritin for this type of reaction.  Patient stated that if it goes untreated then he begins to have difficulty breathing  . Invokana [Canagliflozin] Other (See Comments)    Yeast Infections    Current Outpatient Medications  Medication Sig Dispense Refill  . acetaminophen (TYLENOL) 500 MG tablet Take 1,000 mg by mouth every 6 (six) hours as needed for moderate pain or headache.    . ADVAIR DISKUS 250-50 MCG/DOSE AEPB INHALE 1 PUFF BY MOUTH INTO THE LUNGS EVERY 12 HOURS (Patient taking differently: Inhale 1 puff into the lungs 2 (two) times daily. ) 60 each 5  . aspirin 81 MG tablet Take 81 mg by mouth daily.      . Biotin w/ Vitamins C & E (HAIR/SKIN/NAILS PO) Take 1 tablet by mouth daily.    . bisoprolol (ZEBETA) 5 MG tablet TAKE ONE-HALF TABLET BY MOUTH EVERY DAY (Patient taking differently: Take 2.5 mg by mouth at bedtime. ) 90 tablet 3  . Blood Glucose Monitoring  Suppl (BAYER CONTOUR MONITOR) w/Device KIT CHECK BLOOD SUGAR EVERY DAY 1 kit 0  . Blood Glucose Monitoring Suppl (ONE TOUCH ULTRA 2) w/Device KIT Check BS daily 1 kit 0  . clopidogrel (PLAVIX) 75 MG tablet Take 1 tablet (75 mg total) by mouth daily. 30 tablet 11  . CONTOUR NEXT TEST test strip CHECK BLOOD SUGAR DAILY AS DIRECTED 100 strip 3  . CREON 36000 units CPEP capsule TAKE 2 CAPSULES BY MOUTH THREE TIMES DAILY BEFORE MEALS (Patient taking differently: Take 108,000 Units by mouth 3 (three) times daily before meals. ) 180 capsule 0  . diclofenac sodium (VOLTAREN) 1 % GEL Apply 2 g topically 4 (four) times daily. (Patient taking differently: Apply 2 g topically 4 (four) times daily as needed (for hand pain). ) 100 g 0  . glucose blood (ONETOUCH ULTRA) test strip Check BS daily  DX: E11.9 100 each 12  . HYDROcodone-acetaminophen (NORCO/VICODIN) 5-325 MG  tablet Take 1 tablet by mouth every 6 (six) hours as needed for moderate pain. Pain [facility], [place] 130 tablet 0  . isosorbide mononitrate (IMDUR) 60 MG 24 hr tablet TAKE 1 TABLET BY MOUTH DAILY (Patient taking differently: Take 60 mg by mouth daily. ) 90 tablet 2  . Lancet Devices (ONE TOUCH DELICA LANCING DEV) MISC Use daily DxE11.59 100 each 3  . Lancets (ONETOUCH DELICA PLUS [id number]) MISC 1 Units by Other route daily. 100 each 3  . metFORMIN (GLUCOPHAGE) 1000 MG tablet TAKE 1 TABLET BY MOUTH TWICE DAILY WITH MEALS (Patient taking differently: Take 1,000 mg by mouth in the morning and at bedtime. Morning & afternoon.) 180 tablet 0  . methocarbamol (ROBAXIN) 500 MG tablet TAKE 1 TABLET BY MOUTH EVERY 6 HOURS AS NEEDED FOR MUSCLE SPASMS (Patient taking differently: Take 500 mg by mouth every 6 (six) hours as needed for muscle spasms. ) 120 tablet 2  . Multiple Vitamin (MULTIVITAMIN) tablet Take 1 tablet by mouth daily.      . nitroGLYCERIN (NITROSTAT) 0.4 MG SL tablet Place 1 tablet under the tongue every 5 (five) minutes as needed for  chest pain.    Marland Kitchen ondansetron (ZOFRAN) 4 MG tablet TAKE 1 TABLET BY MOUTH EVERY 8 HOURS AS NEEDED FOR NAUSEA OR VOMITING (Patient taking differently: Take 4 mg by mouth every 8 (eight) hours as needed for nausea or vomiting. ) 30 tablet 2  . pantoprazole (PROTONIX) 40 MG tablet TAKE 1 TABLET BY MOUTH DAILY (Patient taking differently: Take 40 mg by mouth daily before breakfast. ) 90 tablet 3  . pioglitazone (ACTOS) 30 MG tablet Take 1 tablet (30 mg total) by mouth daily. 90 tablet 3  . Probiotic Product (TRUBIOTICS PO) Take 1 capsule by mouth daily.    . rosuvastatin (CRESTOR) 40 MG tablet Take 1 tablet (40 mg total) by mouth at bedtime. 90 tablet 3  . VENTOLIN HFA 108 (90 Base) MCG/ACT inhaler INHALE 2 PUFFS BY MOUTH TWICE DAILY AS NEEDED FOR WHEEZING OR SHORTNESS OF BREATH (Patient taking differently: Inhale 1 puff into the lungs 2 (two) times daily as needed for wheezing or shortness of breath. ) 18 g 3   No current facility-administered medications for this visit.    REVIEW OF SYSTEMS:  _0  denotes positive finding, _1  denotes negative finding Cardiac  Comments:  Chest pain or chest pressure:    Shortness of breath upon exertion:    Short of breath when lying flat:    Irregular heart rhythm:        Vascular    Pain in calf, thigh, or hip brought on by ambulation:    Pain in feet at night that wakes you up from your sleep:     Blood clot in your veins:    Leg swelling:         Pulmonary    Oxygen at home:    Productive cough:     Wheezing:         Neurologic    Sudden weakness in arms or legs:     Sudden numbness in arms or legs:     Sudden onset of difficulty speaking or slurred speech:    Temporary loss of vision in one eye:     Problems with dizziness:         Gastrointestinal    Blood in stool:     Vomited blood:         Genitourinary    Burning when urinating:  Blood in urine:        Psychiatric    Major depression:         Hematologic    Bleeding problems:     Problems with blood clotting too easily:        Skin    Rashes or ulcers:        Constitutional    Fever or chills:      PHYSICAL EXAM: Vitals:   10/29/19 1059  BP: (!) 124/59  Pulse: 82  Resp: 14  Temp: (!) 97.3 F (36.3 C)  TempSrc: Temporal  SpO2: 96%  Weight: 149 lb (67.6 kg)  Height: _0  (1.6 m)    GENERAL: The patient is a well-nourished female, in no acute distress. The vital signs are documented above. CARDIAC: There is a regular rate and rhythm.  VASCULAR:  Right groin incision well-healed Right femoral pulse palpable Brisk right DP PT signals PULMONARY: There is good air exchange bilaterally without wheezing or rales. ABDOMEN: Soft and non-tender with normal pitched bowel sounds.  MUSCULOSKELETAL: There are no major deformities or cyanosis.   DATA:   None  Assessment/Plan:  64 year old female status post right femoral endarterectomy with profundoplasty and bovine pericardial patch on 10/10/2019 for right leg short distance lifestyle limiting claudication.  Her right groin incision is healing without issue.  She has a palpable femoral pulse.  On exam she has very brisk posterior tibial and dorsalis pedis signals.  Discussed that I would normally arrange surveillance in about 6 months for ABIs and duplex of the femoral endarterectomy site but given she is under the care of Dr. Fletcher Anon discussed PRN follow-up and he can let us know if we can be of assistance in the future.    Marty Heck, MD [facility] [place] Office: [phone]

## 2019-10-29 NOTE — Telephone Encounter (Signed)
ntbs

## 2019-11-06 ENCOUNTER — Other Ambulatory Visit: Payer: Self-pay | Admitting: Family Medicine

## 2019-11-06 DIAGNOSIS — Z1231 Encounter for screening mammogram for malignant neoplasm of breast: Secondary | ICD-10-CM

## 2019-11-07 ENCOUNTER — Other Ambulatory Visit: Payer: Self-pay

## 2019-11-07 DIAGNOSIS — K861 Other chronic pancreatitis: Secondary | ICD-10-CM

## 2019-11-07 MED ORDER — ONDANSETRON HCL 4 MG PO TABS: 4.0000 mg | ORAL_TABLET | Freq: Three times a day (TID) | ORAL | 3 refills | Status: DC | PRN

## 2019-11-16 ENCOUNTER — Other Ambulatory Visit: Payer: Self-pay | Admitting: Family Medicine

## 2019-11-22 ENCOUNTER — Ambulatory Visit (INDEPENDENT_AMBULATORY_CARE_PROVIDER_SITE_OTHER): Payer: 59 | Admitting: Family Medicine

## 2019-11-22 ENCOUNTER — Other Ambulatory Visit: Payer: Self-pay

## 2019-11-22 VITALS — BP 140/70 | HR 78 | Temp 97.6°F | Ht 63.0 in | Wt 151.0 lb

## 2019-11-22 DIAGNOSIS — I70209 Unspecified atherosclerosis of native arteries of extremities, unspecified extremity: Secondary | ICD-10-CM

## 2019-11-22 DIAGNOSIS — Z0001 Encounter for general adult medical examination with abnormal findings: Secondary | ICD-10-CM

## 2019-11-22 DIAGNOSIS — N1831 Chronic kidney disease, stage 3a: Secondary | ICD-10-CM | POA: Diagnosis not present

## 2019-11-22 DIAGNOSIS — I10 Essential (primary) hypertension: Secondary | ICD-10-CM

## 2019-11-22 DIAGNOSIS — K861 Other chronic pancreatitis: Secondary | ICD-10-CM

## 2019-11-22 DIAGNOSIS — E1159 Type 2 diabetes mellitus with other circulatory complications: Secondary | ICD-10-CM | POA: Diagnosis not present

## 2019-11-22 DIAGNOSIS — I701 Atherosclerosis of renal artery: Secondary | ICD-10-CM

## 2019-11-22 DIAGNOSIS — I2510 Atherosclerotic heart disease of native coronary artery without angina pectoris: Secondary | ICD-10-CM

## 2019-11-22 DIAGNOSIS — I739 Peripheral vascular disease, unspecified: Secondary | ICD-10-CM

## 2019-11-22 MED ORDER — HYDROCODONE-ACETAMINOPHEN 5-325 MG PO TABS
1.0000 | ORAL_TABLET | Freq: Four times a day (QID) | ORAL | 0 refills | Status: DC | PRN
Start: 2019-11-22 — End: 2019-12-18

## 2019-11-22 NOTE — Telephone Encounter (Signed)
Ok to refill??  Last office visit 11/22/2019.  Last refill 10/29/2019.

## 2019-11-22 NOTE — Telephone Encounter (Signed)
Pt needs refill on HYDROcodone-acetaminophen (NORCO/VICODIN) 5-325 MG sent to [id number] - [place], [place] - [address] AT [address] [facility] [address]   Pt call back [phone]

## 2019-11-22 NOTE — Progress Notes (Signed)
Subjective:    Patient ID: [name], female    DOB: September 07, 1955, 64 y.o.   MRN: [id number]  Patient is a very pleasant 64 year old Caucasian female here today for a complete physical exam.  She has a history of COPD.  Even today on exam, she is wheezing slightly.  She has a history of coronary artery disease.  Left heart catheterization in 2011 revealed diffuse distal and branch vessel disease with no interventional options.  Last stress test was in 2016 that showed no ischemia.  She has a history of type 2 diabetes mellitus as well as dyslipidemia.  She also has a history of hypertension.  Recently was hospitalized due to a right femoral artery stenosis.  She underwent right femoral endarterectomy and patch angioplasty.  She denies any claudication in her extremities today.  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  She had a Cologuard in 2019 that was negative.  She is not due again until 2022.  She is due for a mammogram but she is already scheduled this.  She is overdue for a Pap smear however she refuses this today.  She refuses a flu shot due to nerve damage and muscle weakness in her left arm after her last flu shot.  She has had both pneumonia vaccines.  She is due for a Covid shot but she refuses this.  I spent 10 minutes today trying to convince her to receive the Covid vaccine.  Past Medical History:  Diagnosis Date  . Asthma with COPD ([place])    PATIENT DENIES  . CAD (coronary artery disease)    a. [place] 2011: Diffuse distal and branch vessel disease - patient managed medically, no interventional options. b. [place] 03/2014 - low risk, no ischemia.  . Complication of anesthesia    slow to wake up with last surgery in 2011   . Diabetes mellitus with circulatory complication ([place])   . Dyslipidemia   . Headache    hx of migraines   . Hypertension   . Osteoarthritis    severe right knee  R TKR  . PAD (peripheral artery disease) ([place])    a. Severe stenosis mid right SFA s/p  atherectomy 03/25/09. b. peripheral angiography in 12/2011 which showed only about 50% diffuse right SFA stenosis.  . Pancreatitis 2003  . Renal artery stenosis ([place])    a. [place] 2011 - 40-50% left RAS.  . Tobacco use disorder    quit 11/10   Past Surgical History:  Procedure Laterality Date  . ABDOMINAL AORTAGRAM N/A 12/21/2011   Procedure: ABDOMINAL Maxcine Ham;  Surgeon: Wellington Hampshire, MD;  Location: [facility];  Service: Cardiovascular;  Laterality: N/A;  . ABDOMINAL AORTOGRAM W/LOWER EXTREMITY Bilateral 05/08/2019   Procedure: ABDOMINAL AORTOGRAM W/LOWER EXTREMITY;  Surgeon: Wellington Hampshire, MD;  Location: [facility];  Service: Cardiovascular;  Laterality: Bilateral;  . CARDIAC CATHETERIZATION    . ENDARTERECTOMY FEMORAL Right 10/10/2019   Procedure: RIGHT FEMORAL ENDARTERECTOMY;  Surgeon: Marty Heck, MD;  Location: [facility];  Service: Vascular;  Laterality: Right;  . ESOPHAGOGASTRODUODENOSCOPY (EGD) WITH PROPOFOL N/A 10/05/2017   Procedure: ESOPHAGOGASTRODUODENOSCOPY (EGD) WITH PROPOFOL;  Surgeon: Milus Banister, MD;  Location: [facility];  Service: Endoscopy;  Laterality: N/A;  . EUS N/A 10/05/2017   Procedure: UPPER ENDOSCOPIC ULTRASOUND (EUS) RADIAL;  Surgeon: Milus Banister, MD;  Location: [facility];  Service: Endoscopy;  Laterality: N/A;  . FOOT SURGERY Left   . LOWER EXTREMITY ANGIOGRAM  Bilateral 05/27/2015   Procedure: Lower Extremity Angiogram;  Surgeon: Wellington Hampshire, MD;  Location: [facility];  Service: Cardiovascular;  Laterality: Bilateral;  . PATCH ANGIOPLASTY Right 10/10/2019   Procedure: PATCH ANGIOPLASTY USING [place];  Surgeon: Marty Heck, MD;  Location: [facility];  Service: Vascular;  Laterality: Right;  . PERIPHERAL VASCULAR ATHERECTOMY Left 05/08/2019   Procedure: PERIPHERAL VASCULAR ATHERECTOMY;  Surgeon: Wellington Hampshire, MD;  Location: [facility];  Service: Cardiovascular;  Laterality: Left;  .  PERIPHERAL VASCULAR CATHETERIZATION N/A 05/27/2015   Procedure: Abdominal Aortogram;  Surgeon: Wellington Hampshire, MD;  Location: [facility];  Service: Cardiovascular;  Laterality: N/A;  . PERIPHERAL VASCULAR CATHETERIZATION Right 05/27/2015   Procedure: Peripheral Vascular Intervention;  Surgeon: Wellington Hampshire, MD;  Location: [facility];  Service: Cardiovascular;  Laterality: Right;  SFA  . TOTAL KNEE ARTHROPLASTY Right 2011  . TOTAL KNEE ARTHROPLASTY Left 05/22/2014   Procedure: LEFT TOTAL KNEE ARTHROPLASTY;  Surgeon: Latanya Maudlin, MD;  Location: [facility];  Service: Orthopedics;  Laterality: Left;  . TUBAL LIGATION  1980   Current Outpatient Medications on File Prior to Visit  Medication Sig Dispense Refill  . acetaminophen (TYLENOL) 500 MG tablet Take 1,000 mg by mouth every 6 (six) hours as needed for moderate pain or headache.    . ADVAIR DISKUS 250-50 MCG/DOSE AEPB INHALE 1 PUFF BY MOUTH INTO THE LUNGS EVERY 12 HOURS (Patient taking differently: Inhale 1 puff into the lungs 2 (two) times daily. ) 60 each 5  . aspirin 81 MG tablet Take 81 mg by mouth daily.      . Biotin w/ Vitamins C & E (HAIR/SKIN/NAILS PO) Take 1 tablet by mouth daily.    . bisoprolol (ZEBETA) 5 MG tablet TAKE ONE-HALF TABLET BY MOUTH EVERY DAY (Patient taking differently: Take 2.5 mg by mouth at bedtime. ) 90 tablet 3  . Blood Glucose Monitoring Suppl (BAYER CONTOUR MONITOR) w/Device KIT CHECK BLOOD SUGAR EVERY DAY 1 kit 0  . Blood Glucose Monitoring Suppl (ONE TOUCH ULTRA 2) w/Device KIT Check BS daily 1 kit 0  . clopidogrel (PLAVIX) 75 MG tablet Take 1 tablet (75 mg total) by mouth daily. 30 tablet 11  . CONTOUR NEXT TEST test strip CHECK BLOOD SUGAR DAILY AS DIRECTED 100 strip 3  . CREON 36000 units CPEP capsule TAKE 2 CAPSULES BY MOUTH THREE TIMES DAILY BEFORE MEALS (Patient taking differently: Take 108,000 Units by mouth 3 (three) times daily before meals. ) 180 capsule 0  . diclofenac sodium  (VOLTAREN) 1 % GEL Apply 2 g topically 4 (four) times daily. (Patient taking differently: Apply 2 g topically 4 (four) times daily as needed (for hand pain). ) 100 g 0  . glucose blood (ONETOUCH ULTRA) test strip Check BS daily  DX: E11.9 100 each 12  . HYDROcodone-acetaminophen (NORCO/VICODIN) 5-325 MG tablet Take 1 tablet by mouth every 6 (six) hours as needed for moderate pain. Pain Management, [place] 130 tablet 0  . isosorbide mononitrate (IMDUR) 60 MG 24 hr tablet TAKE 1 TABLET BY MOUTH DAILY (Patient taking differently: Take 60 mg by mouth daily. ) 90 tablet 2  . Lancet Devices (ONE TOUCH DELICA LANCING DEV) MISC Use daily DxE11.59 100 each 3  . Lancets (ONETOUCH DELICA PLUS [id number]) MISC 1 Units by Other route daily. 100 each 3  . metFORMIN (GLUCOPHAGE) 1000 MG tablet TAKE 1 TABLET BY MOUTH TWICE DAILY WITH MEALS (Patient taking differently:  Take 1,000 mg by mouth in the morning and at bedtime. Morning & afternoon.) 180 tablet 0  . methocarbamol (ROBAXIN) 500 MG tablet TAKE 1 TABLET BY MOUTH EVERY 6 HOURS AS NEEDED FOR MUSCLE SPASM 120 tablet 2  . Multiple Vitamin (MULTIVITAMIN) tablet Take 1 tablet by mouth daily.      . nitroGLYCERIN (NITROSTAT) 0.4 MG SL tablet Place 1 tablet under the tongue every 5 (five) minutes as needed for chest pain.    Marland Kitchen ondansetron (ZOFRAN) 4 MG tablet Take 1 tablet (4 mg total) by mouth every 8 (eight) hours as needed for nausea or vomiting. 30 tablet 3  . pantoprazole (PROTONIX) 40 MG tablet TAKE 1 TABLET BY MOUTH DAILY (Patient taking differently: Take 40 mg by mouth daily before breakfast. ) 90 tablet 3  . pioglitazone (ACTOS) 30 MG tablet Take 1 tablet (30 mg total) by mouth daily. 90 tablet 3  . Probiotic Product (TRUBIOTICS PO) Take 1 capsule by mouth daily.    . rosuvastatin (CRESTOR) 40 MG tablet Take 1 tablet (40 mg total) by mouth at bedtime. 90 tablet 3  . VENTOLIN HFA 108 (90 Base) MCG/ACT inhaler INHALE 2 PUFFS BY MOUTH TWICE DAILY AS NEEDED FOR  WHEEZING OR SHORTNESS OF BREATH (Patient taking differently: Inhale 1 puff into the lungs 2 (two) times daily as needed for wheezing or shortness of breath. ) 18 g 3   No current facility-administered medications on file prior to visit.   Allergies  Allergen Reactions  . Gabapentin Other (See Comments)    Brain shakes  . Glucotrol [Glipizide] Rash and Other (See Comments)    Red rash  . Influenza Vaccines Other (See Comments)    Significant arm soreness requiring 1 year of physical therapy  . Latex Rash  . Nortriptyline Other (See Comments)    BRAIN SHAKE  . Tetanus Toxoid Hives, Swelling, Rash and Other (See Comments)    Swelling to site of injection with rash and fever  . Other Itching, Other (See Comments) and Cough    Patient is highly allergic to Cats and she begins to break out in rash and turns red if someone caring for her has cats at home.  Patient takes Claritin for this type of reaction.  Patient stated that if it goes untreated then he begins to have difficulty breathing  . Invokana [Canagliflozin] Other (See Comments)    Yeast Infections   Social History   Socioeconomic History  . Marital status: Married    Spouse name: Not on file  . Number of children: Not on file  . Years of education: 60  . Highest education level: Not on file  Occupational History  . Not on file  Tobacco Use  . Smoking status: Former Smoker    Quit date: 12/13/2008    Years since quitting: 10.9  . Smokeless tobacco: Never Used  Vaping Use  . Vaping Use: Never used  Substance and Sexual Activity  . Alcohol use: No    Comment: Quit drinking around year 2000  . Drug use: No    Comment: Patient quit smoking around 2011  . Sexual activity: Yes    Partners: Male  Other Topics Concern  . Not on file  Social History Narrative   Denies any IV drug use or marijuana use.  Former smoker for 30 years, quit Nov. 2010. She is married with 2 children. Disabled from knee, not working.   Social  Determinants of Health   Financial Resource Strain:   .  Difficulty of Paying Living Expenses: Not on file  Food Insecurity:   . Worried About Charity fundraiser in the Last Year: Not on file  . Ran Out of Food in the Last Year: Not on file  Transportation Needs:   . Lack of Transportation (Medical): Not on file  . Lack of Transportation (Non-Medical): Not on file  Physical Activity:   . Days of Exercise per Week: Not on file  . Minutes of Exercise per Session: Not on file  Stress:   . Feeling of Stress : Not on file  Social Connections:   . Frequency of Communication with Friends and Family: Not on file  . Frequency of Social Gatherings with Friends and Family: Not on file  . Attends Religious Services: Not on file  . Active Member of Clubs or Organizations: Not on file  . Attends Archivist Meetings: Not on file  . Marital Status: Not on file  Intimate Partner Violence:   . Fear of Current or Ex-Partner: Not on file  . Emotionally Abused: Not on file  . Physically Abused: Not on file  . Sexually Abused: Not on file   Family History  Problem Relation Age of Onset  . CVA Mother   . Emphysema Father   . Breast cancer Sister 53     Review of Systems  All other systems reviewed and are negative.      Objective:   Physical Exam Vitals reviewed.  Constitutional:      General: She is not in acute distress.    Appearance: She is well-developed. She is not diaphoretic.  HENT:     Right Ear: External ear normal.     Left Ear: External ear normal.     Nose: Nose normal.  Eyes:     Conjunctiva/sclera: Conjunctivae normal.  Neck:     Thyroid: Thyromegaly present.     Vascular: No JVD.  Cardiovascular:     Rate and Rhythm: Normal rate and regular rhythm.     Heart sounds: Normal heart sounds. No murmur heard.   Pulmonary:     Effort: Pulmonary effort is normal.     Breath sounds: Normal breath sounds. No wheezing or rales.  Chest:     Chest wall: No  tenderness.  Abdominal:     General: Bowel sounds are normal. There is no distension.     Palpations: Abdomen is soft. There is no mass.     Tenderness: There is no abdominal tenderness. There is no guarding or rebound.  Musculoskeletal:     Cervical back: Neck supple.  Lymphadenopathy:     Cervical: No cervical adenopathy.  Skin:    Findings: No erythema or rash.  Neurological:     Mental Status: She is alert and oriented to person, place, and time.     Cranial Nerves: No cranial nerve deficit.     Motor: No abnormal muscle tone.     Coordination: Coordination normal.     Deep Tendon Reflexes: Reflexes are normal and symmetric.         Assessment & Plan:   Type 2 diabetes mellitus with other circulatory complication, without long-term current use of insulin (HCC) - Plan: Hemoglobin A1c, CBC with Differential/Platelet, COMPLETE METABOLIC PANEL WITH GFR, Lipid panel, Microalbumin, urine  Coronary artery disease involving native heart without angina pectoris, unspecified vessel or lesion type  Stage 3a chronic kidney disease (HCC)  PAD (peripheral artery disease) ([facility])  Essential hypertension  Renal artery stenosis ([place])  Other chronic pancreatitis ([place])  Femoral artery stenosis (HCC) Patient's blood pressure today is acceptable at 140/70.  Check CBC, CMP, fasting lipid panel.  Goal LDL cholesterol is less than 70.  Check hemoglobin A1c.  Goal hemoglobin A1c is less than 7.  Check urine microalbumin.  Patient is currently on a statin.  Monitor her chronic kidney disease.  Patient is on Crestor.  She is on dual antiplatelet therapy.  She is on a combination of Metformin and Actos.  If her A1c is elevated, I will consider starting her on Jardiance to try to help the chronic kidney disease and reduce her risk of cardiac mortality.  Recommended Covid vaccine.  Patient declined a flu shot.  Recommended a Pap smear.  Cologuard is up-to-date.  Patient is already scheduled her  mammogram.

## 2019-11-23 LAB — COMPLETE METABOLIC PANEL WITH GFR
AG Ratio: 1.9 (calc) (ref 1.0–2.5)
ALT: 12 U/L (ref 6–29)
AST: 15 U/L (ref 10–35)
Albumin: 4.3 g/dL (ref 3.6–5.1)
Alkaline phosphatase (APISO): 78 U/L (ref 37–153)
BUN: 16 mg/dL (ref 7–25)
CO2: 27 mmol/L (ref 20–32)
Calcium: 9.7 mg/dL (ref 8.6–10.4)
Chloride: 101 mmol/L (ref 98–110)
Creat: 0.91 mg/dL (ref 0.50–0.99)
GFR, Est African American: 78 mL/min/{1.73_m2} (ref >=60)
GFR, Est Non African American: 68 mL/min/{1.73_m2} (ref >=60)
Globulin: 2.3 g/dL (calc) (ref 1.9–3.7)
Glucose, Bld: 110 mg/dL — ABNORMAL HIGH (ref 65–99)
Potassium: 4.2 mmol/L (ref 3.5–5.3)
Sodium: 140 mmol/L (ref 135–146)
Total Bilirubin: 0.3 mg/dL (ref 0.2–1.2)
Total Protein: 6.6 g/dL (ref 6.1–8.1)

## 2019-11-23 LAB — LIPID PANEL
Cholesterol: 127 mg/dL (ref <200)
HDL: 49 mg/dL — ABNORMAL LOW (ref >=50)
LDL Cholesterol (Calc): 54 mg/dL (calc)
Non-HDL Cholesterol (Calc): 78 mg/dL (calc) (ref <130)
Total CHOL/HDL Ratio: 2.6 (calc) (ref <5.0)
Triglycerides: 164 mg/dL — ABNORMAL HIGH (ref <150)

## 2019-11-23 LAB — HEMOGLOBIN A1C
Hgb A1c MFr Bld: 6.3 % of total Hgb — ABNORMAL HIGH (ref <5.7)
Mean Plasma Glucose: 134 (calc)
eAG (mmol/L): 7.4 (calc)

## 2019-11-23 LAB — CBC WITH DIFFERENTIAL/PLATELET
Absolute Monocytes: 561 cells/uL (ref 200–950)
Basophils Absolute: 59 cells/uL (ref 0–200)
Basophils Relative: 0.9 %
Eosinophils Absolute: 257 cells/uL (ref 15–500)
Eosinophils Relative: 3.9 %
HCT: 35 % (ref 35.0–45.0)
Hemoglobin: 11.4 g/dL — ABNORMAL LOW (ref 11.7–15.5)
Lymphs Abs: 1419 cells/uL (ref 850–3900)
MCH: 28.6 pg (ref 27.0–33.0)
MCHC: 32.6 g/dL (ref 32.0–36.0)
MCV: 87.9 fL (ref 80.0–100.0)
MPV: 9.2 fL (ref 7.5–12.5)
Monocytes Relative: 8.5 %
Neutro Abs: 4303 cells/uL (ref 1500–7800)
Neutrophils Relative %: 65.2 %
Platelets: 292 10*3/uL (ref 140–400)
RBC: 3.98 10*6/uL (ref 3.80–5.10)
RDW: 13.4 % (ref 11.0–15.0)
Total Lymphocyte: 21.5 %
WBC: 6.6 10*3/uL (ref 3.8–10.8)

## 2019-11-23 LAB — MICROALBUMIN, URINE: Microalb, Ur: 0.3 mg/dL

## 2019-12-03 ENCOUNTER — Encounter: Payer: Self-pay | Admitting: Cardiovascular Disease

## 2019-12-03 ENCOUNTER — Ambulatory Visit (INDEPENDENT_AMBULATORY_CARE_PROVIDER_SITE_OTHER): Payer: 59 | Admitting: Cardiovascular Disease

## 2019-12-03 ENCOUNTER — Other Ambulatory Visit: Payer: Self-pay

## 2019-12-03 VITALS — BP 116/54 | HR 80 | Ht 63.0 in | Wt 151.0 lb

## 2019-12-03 DIAGNOSIS — I2510 Atherosclerotic heart disease of native coronary artery without angina pectoris: Secondary | ICD-10-CM

## 2019-12-03 DIAGNOSIS — E785 Hyperlipidemia, unspecified: Secondary | ICD-10-CM

## 2019-12-03 DIAGNOSIS — I739 Peripheral vascular disease, unspecified: Secondary | ICD-10-CM

## 2019-12-03 NOTE — Progress Notes (Signed)
Cardiology Office Note   Date:  12/03/2019   ID:  [name], DOB 11-11-1955, MRN [id number]  PCP:  Susy Frizzle, MD  Cardiologist:  Fletcher Anon  Chief Complaint  Patient presents with   Follow-up    6 months.      History of Present Illness: [name] is a 64 y.o. female who presents for a follow up visit regarding regarding peripheral arterial disease and coronary artery disease. She has known history of peripheral arterial disease status post atherectomy of the right SFA in 2011 by Dr. Julianne Handler. She has other chronic medical conditions including CAD, DM, HTN, recurrent idiopathic pancreatitis and hyperlipidemia. She is a former smoker.  She is s/p bilateral TKR.   She had worsening right leg claudication in April 2017. Angiography showed no significant aortoiliac disease. There was moderate right common femoral artery stenosis and severe discrete stenosis in the proximal right SFA with three-vessel runoff below the knee. There was no significant obstructive disease involving the left lower extremity. A self-expanding stent was placed to the proximal right SFA. She had previous bilateral knee replacement and chronic bilateral leg pain.    She had recent worsening of left calf claudication.  Vascular studies showed mildly reduced ABI bilaterally in the 0.8 range.  Duplex showed moderate right common femoral artery stenosis and moderate SFA disease.  On the left, there was severe new stenosis in the proximal SFA. Angiography was done in March of this year which showed borderline stenosis affecting the left common iliac artery, severe calcified stenosis affecting the left proximal SFA and three-vessel runoff below the knee.  On the right, there was severe calcified disease affecting the common femoral artery with patent proximal SFA stent followed by 60% disease throughout the whole SFA and three-vessel runoff below the knee.  I performed successful orbital atherectomy and  drug-coated balloon angioplasty to the left SFA.   She underwent staged right common femoral artery endarterectomy by Dr. Carlis Abbott in August.  She has been doing well overall.  She reports mild right calf claudication.  No symptoms on the left side.   Past Medical History:  Diagnosis Date   Asthma with COPD ([place])    PATIENT DENIES   CAD (coronary artery disease)    a. [place] 2011: Diffuse distal and branch vessel disease - patient managed medically, no interventional options. b. [place] 03/2014 - low risk, no ischemia.   Complication of anesthesia    slow to wake up with last surgery in 2011    Diabetes mellitus with circulatory complication ([place])    Dyslipidemia    Headache    hx of migraines    Hypertension    Osteoarthritis    severe right knee  R TKR   PAD (peripheral artery disease) ([place])    a. Severe stenosis mid right SFA s/p atherectomy 03/25/09. b. peripheral angiography in 12/2011 which showed only about 50% diffuse right SFA stenosis.   Pancreatitis 2003   Renal artery stenosis ([place])    a. [place] 2011 - 40-50% left RAS.   Tobacco use disorder    quit 11/10    Past Surgical History:  Procedure Laterality Date   ABDOMINAL AORTAGRAM N/A 12/21/2011   Procedure: ABDOMINAL AORTAGRAM;  Surgeon: Wellington Hampshire, MD;  Location: [facility];  Service: Cardiovascular;  Laterality: N/A;   ABDOMINAL AORTOGRAM W/LOWER EXTREMITY Bilateral 05/08/2019   Procedure: ABDOMINAL AORTOGRAM W/LOWER EXTREMITY;  Surgeon: Wellington Hampshire, MD;  Location: [facility];  Service:  Cardiovascular;  Laterality: Bilateral;   CARDIAC CATHETERIZATION     ENDARTERECTOMY FEMORAL Right 10/10/2019   Procedure: RIGHT FEMORAL ENDARTERECTOMY;  Surgeon: Marty Heck, MD;  Location: [facility];  Service: Vascular;  Laterality: Right;   ESOPHAGOGASTRODUODENOSCOPY (EGD) WITH PROPOFOL N/A 10/05/2017   Procedure: ESOPHAGOGASTRODUODENOSCOPY (EGD) WITH PROPOFOL;  Surgeon: Milus Banister, MD;  [facility]: [facility];  Service: Endoscopy;  Laterality: N/A;   EUS N/A 10/05/2017   Procedure: UPPER ENDOSCOPIC ULTRASOUND (EUS) RADIAL;  Surgeon: Milus Banister, MD;  Location: [facility];  Service: Endoscopy;  Laterality: N/A;   FOOT SURGERY Left    LOWER EXTREMITY ANGIOGRAM Bilateral 05/27/2015   Procedure: Lower Extremity Angiogram;  Surgeon: Wellington Hampshire, MD;  Location: [place] [facility];  Service: Cardiovascular;  Laterality: Bilateral;   PATCH ANGIOPLASTY Right 10/10/2019   Procedure: PATCH ANGIOPLASTY USING Rueben Bash BIOLOGIC PATCH;  Surgeon: Marty Heck, MD;  Location: [facility] [place] [facility];  Service: Vascular;  Laterality: Right;   PERIPHERAL VASCULAR ATHERECTOMY Left 05/08/2019   Procedure: PERIPHERAL VASCULAR ATHERECTOMY;  Surgeon: Wellington Hampshire, MD;  Location: [facility];  Service: Cardiovascular;  Laterality: Left;   PERIPHERAL VASCULAR CATHETERIZATION N/A 05/27/2015   Procedure: Abdominal Aortogram;  Surgeon: Wellington Hampshire, MD;  Location: [facility];  Service: Cardiovascular;  Laterality: N/A;   PERIPHERAL VASCULAR CATHETERIZATION Right 05/27/2015   Procedure: Peripheral Vascular Intervention;  Surgeon: Wellington Hampshire, MD;  Location: [facility];  Service: Cardiovascular;  Laterality: Right;  SFA   TOTAL KNEE ARTHROPLASTY Right 2011   TOTAL KNEE ARTHROPLASTY Left 05/22/2014   Procedure: LEFT TOTAL KNEE ARTHROPLASTY;  Surgeon: Latanya Maudlin, MD;  [facility]: [facility];  Service: Orthopedics;  Laterality: Left;   TUBAL LIGATION  1980     Current Outpatient Medications  Medication Sig Dispense Refill   acetaminophen (TYLENOL) 500 MG tablet Take 1,000 mg by mouth every 6 (six) hours as needed for moderate pain or headache.     ADVAIR DISKUS 250-50 MCG/DOSE AEPB INHALE 1 PUFF BY MOUTH INTO THE LUNGS EVERY 12 HOURS (Patient taking differently: Inhale 1 puff into the lungs 2 (two) times daily. ) 60 each 5   aspirin 81 MG tablet Take 81 mg by mouth daily.        Biotin w/ Vitamins C & E (HAIR/SKIN/NAILS PO) Take 1 tablet by mouth daily.     bisoprolol (ZEBETA) 5 MG tablet TAKE ONE-HALF TABLET BY MOUTH EVERY DAY (Patient taking differently: Take 2.5 mg by mouth at bedtime. ) 90 tablet 3   Blood Glucose Monitoring Suppl (BAYER CONTOUR MONITOR) w/Device KIT CHECK BLOOD SUGAR EVERY DAY 1 kit 0   Blood Glucose Monitoring Suppl (ONE TOUCH ULTRA 2) w/Device KIT Check BS daily 1 kit 0   clopidogrel (PLAVIX) 75 MG tablet Take 1 tablet (75 mg total) by mouth daily. 30 tablet 11   CONTOUR NEXT TEST test strip CHECK BLOOD SUGAR DAILY AS DIRECTED 100 strip 3   CREON 36000 units CPEP capsule TAKE 2 CAPSULES BY MOUTH THREE TIMES DAILY BEFORE MEALS (Patient taking differently: Take 108,000 Units by mouth 3 (three) times daily before meals. ) 180 capsule 0   diclofenac sodium (VOLTAREN) 1 % GEL Apply 2 g topically 4 (four) times daily. (Patient taking differently: Apply 2 g topically 4 (four) times daily as needed (for hand pain). ) 100 g 0   glucose blood (ONETOUCH ULTRA) test strip Check BS daily  DX: E11.9 100 each 12  HYDROcodone-acetaminophen (NORCO/VICODIN) 5-325 MG tablet Take 1 tablet by mouth every 6 (six) hours as needed for moderate pain. Pain Management, [place] 130 tablet 0   isosorbide mononitrate (IMDUR) 60 MG 24 hr tablet TAKE 1 TABLET BY MOUTH DAILY (Patient taking differently: Take 60 mg by mouth daily. ) 90 tablet 2   Lancet Devices (ONE TOUCH DELICA LANCING DEV) MISC Use daily DxE11.59 100 each 3   Lancets (ONETOUCH DELICA PLUS [id number]) MISC 1 Units by Other route daily. 100 each 3   metFORMIN (GLUCOPHAGE) 1000 MG tablet TAKE 1 TABLET BY MOUTH TWICE DAILY WITH MEALS (Patient taking differently: Take 1,000 mg by mouth in the morning and at bedtime. Morning & afternoon.) 180 tablet 0   methocarbamol (ROBAXIN) 500 MG tablet TAKE 1 TABLET BY MOUTH EVERY 6 HOURS AS NEEDED FOR MUSCLE SPASM 120 tablet 2   Multiple Vitamin (MULTIVITAMIN)  tablet Take 1 tablet by mouth daily.       nitroGLYCERIN (NITROSTAT) 0.4 MG SL tablet Place 1 tablet under the tongue every 5 (five) minutes as needed for chest pain.     ondansetron (ZOFRAN) 4 MG tablet Take 1 tablet (4 mg total) by mouth every 8 (eight) hours as needed for nausea or vomiting. 30 tablet 3   pantoprazole (PROTONIX) 40 MG tablet TAKE 1 TABLET BY MOUTH DAILY (Patient taking differently: Take 40 mg by mouth daily before breakfast. ) 90 tablet 3   pioglitazone (ACTOS) 30 MG tablet Take 1 tablet (30 mg total) by mouth daily. 90 tablet 3   Probiotic Product (TRUBIOTICS PO) Take 1 capsule by mouth daily.     rosuvastatin (CRESTOR) 40 MG tablet Take 1 tablet (40 mg total) by mouth at bedtime. 90 tablet 3   VENTOLIN HFA 108 (90 Base) MCG/ACT inhaler INHALE 2 PUFFS BY MOUTH TWICE DAILY AS NEEDED FOR WHEEZING OR SHORTNESS OF BREATH (Patient taking differently: Inhale 1 puff into the lungs 2 (two) times daily as needed for wheezing or shortness of breath. ) 18 g 3   No current facility-administered medications for this visit.    Allergies:   Gabapentin, Glucotrol [glipizide], Influenza vaccines, Latex, Nortriptyline, Tetanus toxoid, Other, and Invokana [canagliflozin]    Social History:  The patient  reports that she quit smoking about 10 years ago. She has never used smokeless tobacco. She reports that she does not drink alcohol and does not use drugs.   Family History:  The patient's family history includes Breast cancer (age of onset: 40) in her sister; CVA in her mother; Emphysema in her father.    ROS:  Please see the history of present illness.   Otherwise, review of systems are positive for .   All other systems are reviewed and negative.    PHYSICAL EXAM: VS:  BP (!) 116/54 (BP Location: Left Arm, Patient Position: Sitting, Cuff Size: Normal)    Pulse 80    Ht 5' 3"  (1.6 m)    Wt 151 lb (68.5 kg)    BMI 26.75 kg/m  , BMI Body mass index is 26.75 kg/m. GEN: Well  nourished, well developed, in no acute distress  HEENT: normal  Neck: no JVD, carotid bruits, or masses Cardiac: RRR; no murmurs, rubs, or gallops,no edema  Respiratory:  clear to auscultation bilaterally, normal work of breathing GI: soft, nontender, nondistended, + BS MS: no deformity or atrophy  Skin: warm and dry, no rash Neuro:  Strength and sensation are intact Psych: euthymic mood, full affect Vascular :  Femoral pulse  is normal on the right side and slightly diminished on the left side.  No hematoma.  Distal pulses are palpable.   EKG:  EKG is ordered today. EKG showed normal sinus rhythm with no significant ST or T wave changes.    Recent Labs: 11/22/2019: ALT 12; BUN 16; Creat 0.91; Hemoglobin 11.4; Platelets 292; Potassium 4.2; Sodium 140    Lipid Panel    Component Value Date/Time   CHOL 127 11/22/2019 0956   TRIG 164 (H) 11/22/2019 0956   HDL 49 (L) 11/22/2019 0956   CHOLHDL 2.6 11/22/2019 0956   VLDL 35 10/11/2019 0219   LDLCALC 54 11/22/2019 0956   LDLDIRECT 50.6 01/02/2014 1003      Wt Readings from Last 3 Encounters:  12/03/19 151 lb (68.5 kg)  11/22/19 151 lb (68.5 kg)  10/29/19 149 lb (67.6 kg)       ASSESSMENT AND PLAN:  1.  PAD with claudication: Status post left SFA atherectomy and drug-coated balloon angioplasty as well as right common femoral artery endarterectomy.  Significant improvement in symptoms.  Continue dual antiplatelet therapy.  Repeat Doppler study next month which is already scheduled.  2. Coronary artery disease involving native coronary arteries without angina:  Previous cardiac catheterization in 2011 showed distal LAD and small branch disease which is being managed medically.  She had worsening chest pain last year but she reports improvement in symptoms since then.  Continue medical therapy.  3. Hyperlipidemia:  Continue treatment with high-dose rosuvastatin.  I reviewed most recent lipid profile which showed an LDL of 54.   Triglyceride was mildly elevated at 164.  4. Diabetes mellitus: Managed by primary care physician.    Disposition:   FU with me in 6 months  Signed,  Kathlyn Sacramento, MD  12/03/2019 9:28 AM    [place]

## 2019-12-03 NOTE — Patient Instructions (Signed)

## 2019-12-05 ENCOUNTER — Ambulatory Visit: Payer: 59

## 2019-12-11 ENCOUNTER — Ambulatory Visit (HOSPITAL_COMMUNITY)
Admission: RE | Admit: 2019-12-11 | Discharge: 2019-12-11 | Disposition: A | Discharge status: Home / Self Care | Payer: 59 | Source: Ambulatory Visit | Attending: Cardiovascular Disease | Admitting: Cardiovascular Disease

## 2019-12-11 ENCOUNTER — Other Ambulatory Visit: Payer: Self-pay

## 2019-12-11 DIAGNOSIS — Z95820 Peripheral vascular angioplasty status with implants and grafts: Secondary | ICD-10-CM | POA: Insufficient documentation

## 2019-12-11 DIAGNOSIS — I739 Peripheral vascular disease, unspecified: Secondary | ICD-10-CM | POA: Diagnosis present

## 2019-12-16 ENCOUNTER — Other Ambulatory Visit (HOSPITAL_COMMUNITY): Payer: Self-pay | Admitting: Cardiovascular Disease

## 2019-12-16 DIAGNOSIS — I739 Peripheral vascular disease, unspecified: Secondary | ICD-10-CM

## 2019-12-18 ENCOUNTER — Inpatient Hospital Stay (HOSPITAL_COMMUNITY): Admission: RE | Admit: 2019-12-18 | Payer: 59 | Source: Ambulatory Visit

## 2019-12-18 ENCOUNTER — Encounter: Payer: Self-pay | Admitting: Family Medicine

## 2019-12-18 ENCOUNTER — Encounter: Payer: Self-pay | Admitting: *Deleted

## 2019-12-18 ENCOUNTER — Other Ambulatory Visit: Payer: Self-pay

## 2019-12-18 ENCOUNTER — Ambulatory Visit (INDEPENDENT_AMBULATORY_CARE_PROVIDER_SITE_OTHER): Payer: 59 | Admitting: Family Medicine

## 2019-12-18 VITALS — BP 118/64 | HR 100 | Temp 97.9°F | Resp 18 | Ht 63.0 in | Wt 148.0 lb

## 2019-12-18 DIAGNOSIS — R2231 Localized swelling, mass and lump, right upper limb: Secondary | ICD-10-CM | POA: Diagnosis not present

## 2019-12-18 DIAGNOSIS — S99922A Unspecified injury of left foot, initial encounter: Secondary | ICD-10-CM

## 2019-12-18 MED ORDER — HYDROCODONE-ACETAMINOPHEN 5-325 MG PO TABS
1.0000 | ORAL_TABLET | Freq: Four times a day (QID) | ORAL | 0 refills | Status: DC | PRN
Start: 2019-12-18 — End: 2020-01-16

## 2019-12-18 NOTE — Progress Notes (Signed)
° °  Subjective:    Patient ID: [name], female    DOB: 1955/09/14, 64 y.o.   MRN: [id number]  Patient presents for Knot to R Forearm (x5 days- smal lump to arm, growing in size and discoloration) and L 2cd Toe Injury (x1 day- kicked direction and now toe is discolored and possibly Fx)  Patient here with a swelling to her right forearm that started 5 days ago.  She is on both Plavix and aspirin but she does not remember injuring her arm.  States that she noted a small knot in a few hours later she had 3 kn in a row.  They were tender to touch.  She never noted any fever or drainage swelling of her wrist or elbow.  2 of them went down but now she has 1 large 1 that has not improved and has bruising around it.  An injury to her left second toe.  She was playing up hollowing decorations and was keeping the box and keep it too hard.  It is now black and blue with swelling.  She is able to walk but has discomfort.  She does have history of blood clots has had multiple vascular interventions because of peripheral vascular disease.  Her last surgery was performed on her right leg a few months ago.    Review Of Systems:  GEN- denies fatigue, fever, weight loss,weakness, recent illness HEENT- denies eye drainage, change in vision, nasal discharge, CVS- denies chest pain, palpitations RESP- denies SOB, cough, wheeze ABD- denies N/V, change in stools, abd pain GU- denies dysuria, hematuria, dribbling, incontinence MSK- denies joint pain, muscle aches, injury Neuro- denies headache, dizziness, syncope, seizure activity       Objective:    BP 118/64    Pulse 100    Temp 97.9 F (36.6 C) (Temporal)    Resp 18    Ht 5\' 3"  (1.6 m)    Wt 148 lb (67.1 kg)    SpO2 96%    BMI 26.22 kg/m  GEN- NAD, alert and oriented x3 HEENT- PERRL, EOMI, non injected sclera, pink conjunctiva CVS- RRR, no murmur RESP-CTAB MSK Right forearm 2-3 cm swelling with hardened nodule at center, mild pulsation felt,  bruising over top, TTP EXT- left foot 2nd toe bruising at DIP with swelling TTP, no angled deformity able to weight bear with limp, FROM ankle  Pulses- Radial, DP- 2+        Assessment & Plan:      Problem List Items Addressed This Visit    None    Visit Diagnoses    Injury of left toe, initial encounter    -  Primary   Pt likley has fracture/possible avulsion, as lesser digit and no gross deformity will buddy tape in office, ICE and elevate, she has chronic pain meds   Subcutaneous mass of right forearm       with history of clots, concern this is a hematoma, but unclear why it coalesced from 3 nodules to 1, obtain with her history of clots, apply warm compress    Relevant Orders   [place] MiscellaneoUS Localization      Note: This dictation was prepared with Dragon dictation along with smaller phrase technology. Any transcriptional errors that result from this process are unintentional.

## 2019-12-18 NOTE — Addendum Note (Signed)
Addended by: Milinda Antis F on: 12/18/2019 07:37 PM   Modules accepted: Orders

## 2019-12-18 NOTE — Patient Instructions (Signed)
Apply warm compress to your arm Use ICE and elevate foot tonight Buddy tape  F/U as needed

## 2019-12-19 ENCOUNTER — Ambulatory Visit (HOSPITAL_COMMUNITY)
Admission: RE | Admit: 2019-12-19 | Discharge: 2019-12-19 | Disposition: A | Discharge status: Home / Self Care | Payer: 59 | Source: Ambulatory Visit | Attending: Family Medicine | Admitting: Family Medicine

## 2019-12-19 DIAGNOSIS — R2231 Localized swelling, mass and lump, right upper limb: Secondary | ICD-10-CM | POA: Diagnosis not present

## 2019-12-27 ENCOUNTER — Telehealth: Payer: Self-pay | Admitting: Family Medicine

## 2019-12-27 DIAGNOSIS — R2231 Localized swelling, mass and lump, right upper limb: Secondary | ICD-10-CM

## 2019-12-27 DIAGNOSIS — R9389 Abnormal findings on diagnostic imaging of other specified body structures: Secondary | ICD-10-CM

## 2019-12-27 NOTE — Telephone Encounter (Signed)
Call placed to patient to inquire.   Reports that she continues to have some soreness and swelling.  States that discoloration and bruising is improving. States that she would like to proceed with CT scan as area has not healed.   MD please advise.

## 2019-12-27 NOTE — Telephone Encounter (Signed)
Was told if she's not  feeling better then call the office also was told by Dr.Madaket may need a CT scan.

## 2019-12-30 NOTE — Telephone Encounter (Signed)
CT scan ordered, we will contact with appt time

## 2019-12-31 NOTE — Telephone Encounter (Signed)
Please call patient to inform of imaging once scheduled.

## 2020-01-16 ENCOUNTER — Other Ambulatory Visit: Payer: Self-pay | Admitting: Family Medicine

## 2020-01-16 MED ORDER — HYDROCODONE-ACETAMINOPHEN 5-325 MG PO TABS
1.0000 | ORAL_TABLET | Freq: Four times a day (QID) | ORAL | 0 refills | Status: DC | PRN
Start: 2020-01-16 — End: 2020-02-17

## 2020-01-16 NOTE — Telephone Encounter (Signed)
Refill Hydrocodone ---[id number] - [place], [place] - [address] AT [facility] [address] & [address]

## 2020-01-16 NOTE — Telephone Encounter (Signed)
Rx refill sent to Provider 

## 2020-01-22 ENCOUNTER — Ambulatory Visit
Admission: RE | Admit: 2020-01-22 | Discharge: 2020-01-22 | Disposition: A | Discharge status: Home / Self Care | Payer: 59 | Source: Ambulatory Visit | Attending: Family Medicine | Admitting: Family Medicine

## 2020-01-22 ENCOUNTER — Other Ambulatory Visit: Payer: Self-pay

## 2020-01-22 DIAGNOSIS — R9389 Abnormal findings on diagnostic imaging of other specified body structures: Secondary | ICD-10-CM

## 2020-01-22 DIAGNOSIS — R2231 Localized swelling, mass and lump, right upper limb: Secondary | ICD-10-CM

## 2020-01-22 MED ORDER — IOPAMIDOL (ISOVUE-300) INJECTION 61%
100.0000 mL | Freq: Once | INTRAVENOUS | Status: AC | PRN
  Administered 2020-01-22: 100 mL via INTRAVENOUS

## 2020-01-24 NOTE — Progress Notes (Signed)
Spoke with husband of pt, will relay message. Says knot has gone down but not all the way.

## 2020-02-03 ENCOUNTER — Ambulatory Visit
Admission: RE | Admit: 2020-02-03 | Discharge: 2020-02-03 | Disposition: A | Discharge status: Home / Self Care | Payer: 59 | Source: Ambulatory Visit | Attending: Family Medicine | Admitting: Family Medicine

## 2020-02-03 ENCOUNTER — Telehealth: Payer: Self-pay | Admitting: Family Medicine

## 2020-02-03 ENCOUNTER — Other Ambulatory Visit: Payer: Self-pay

## 2020-02-03 DIAGNOSIS — Z1231 Encounter for screening mammogram for malignant neoplasm of breast: Secondary | ICD-10-CM

## 2020-02-03 MED ORDER — PIOGLITAZONE HCL 30 MG PO TABS
30.0000 mg | ORAL_TABLET | Freq: Every day | ORAL | 3 refills | Status: DC
Start: 2020-02-03 — End: 2020-12-23

## 2020-02-03 NOTE — Telephone Encounter (Signed)
Rx sent to patient pharmacy.

## 2020-02-03 NOTE — Telephone Encounter (Signed)
Patient needs a refill on her pioglitazone (ACTOS) 30 MG tablet  [facility]   CB# [phone]

## 2020-02-17 ENCOUNTER — Other Ambulatory Visit: Payer: Self-pay

## 2020-02-17 MED ORDER — HYDROCODONE-ACETAMINOPHEN 5-325 MG PO TABS: 1.0000 | ORAL_TABLET | Freq: Four times a day (QID) | ORAL | 0 refills | Status: DC | PRN

## 2020-02-17 NOTE — Telephone Encounter (Signed)
Ok to refill??  Last office visit 12/18/2019.  Last refill 01/16/2020.

## 2020-02-17 NOTE — Telephone Encounter (Signed)
Pt needs refill on HYDROcodone-acetaminophen (NORCO/VICODIN) 5-325 MG tablet [[id number]] sent to [id number] - [place], [place] - [address] AT   Pt call back [phone]

## 2020-02-28 ENCOUNTER — Other Ambulatory Visit: Payer: Self-pay | Admitting: Family Medicine

## 2020-02-28 DIAGNOSIS — K861 Other chronic pancreatitis: Secondary | ICD-10-CM

## 2020-03-11 ENCOUNTER — Other Ambulatory Visit: Payer: Self-pay | Admitting: Family Medicine

## 2020-03-12 ENCOUNTER — Telehealth: Payer: Self-pay | Admitting: Family Medicine

## 2020-03-12 NOTE — Telephone Encounter (Signed)
Patient reports PA required for Creon.   PA submitted.   Dx: K86.1- chronic pancreatitis.   Your information has been sent to [employer].

## 2020-03-12 NOTE — Telephone Encounter (Signed)
Ok to refill 

## 2020-03-12 NOTE — Telephone Encounter (Signed)
Pt called wanting to let Dr know that she needed some paperwork filled out for insurance to help with getting this med CREON 36000 units CPEP capsule [[id number]]  She asked that someone please call back at  Cb#: [phone]

## 2020-03-16 ENCOUNTER — Other Ambulatory Visit: Payer: Self-pay | Admitting: Family Medicine

## 2020-03-16 ENCOUNTER — Other Ambulatory Visit: Payer: Self-pay

## 2020-03-16 ENCOUNTER — Telehealth: Payer: Self-pay

## 2020-03-16 ENCOUNTER — Telehealth: Payer: Self-pay | Admitting: Family Medicine

## 2020-03-16 NOTE — Telephone Encounter (Signed)
Pt called needing a refill of  HYDROcodone-acetaminophen (NORCO/VICODIN) 5-325   Cb#: [phone]

## 2020-03-16 NOTE — Telephone Encounter (Signed)
Pt called to chk on PA

## 2020-03-16 NOTE — Telephone Encounter (Signed)
Received fax from [employer] requesting more information.   Provider completed form and form faxed to [employer].

## 2020-03-17 MED ORDER — HYDROCODONE-ACETAMINOPHEN 5-325 MG PO TABS
1.0000 | ORAL_TABLET | Freq: Four times a day (QID) | ORAL | 0 refills | Status: DC | PRN
Start: 2020-03-17 — End: 2020-04-14

## 2020-03-18 ENCOUNTER — Telehealth: Payer: Self-pay | Admitting: Family Medicine

## 2020-03-18 NOTE — Telephone Encounter (Signed)
Message sent to PCP in previous encounter.   Awaiting recommendations.

## 2020-03-18 NOTE — Telephone Encounter (Signed)
[facility] denied pt  CREON 36000 units CPEP capsule.

## 2020-03-18 NOTE — Telephone Encounter (Signed)
Received PA determination. PA denied.    In order for your request to be approved, your provider would need to show that you have met the guideline rules below:  ONE of the following rules have been met:  (1) If the formulary only offers one alternative medication, an adequate trial and failure, or intolerance to only one formulary medications (such as ZENPEP CAPSULE) used to treat the diagnosis, (2) All formulary alternative medications are contraindicated (there are medical reasons why you cannot use a treatment), due to your existing concurrent conditions or medication therapy.  We do not have information showing that you have tried Zenpep or that you have a medical reason why you cannot.

## 2020-03-19 MED ORDER — ZENPEP 20000-63000 UNITS PO CPEP: 3.0000 | ORAL_CAPSULE | Freq: Three times a day (TID) | ORAL | 3 refills | Status: DC

## 2020-03-19 NOTE — Telephone Encounter (Signed)
Zenpep (20,000 units lipase per capsule), 3 caps per meal.

## 2020-03-19 NOTE — Telephone Encounter (Signed)
Prescription sent to pharmacy.

## 2020-04-03 ENCOUNTER — Ambulatory Visit (INDEPENDENT_AMBULATORY_CARE_PROVIDER_SITE_OTHER): Payer: 59 | Admitting: Family Medicine

## 2020-04-03 ENCOUNTER — Ambulatory Visit
Admission: RE | Admit: 2020-04-03 | Discharge: 2020-04-03 | Disposition: A | Discharge status: Home / Self Care | Payer: 59 | Source: Ambulatory Visit | Attending: Family Medicine | Admitting: Family Medicine

## 2020-04-03 ENCOUNTER — Other Ambulatory Visit: Payer: Self-pay

## 2020-04-03 ENCOUNTER — Encounter: Payer: Self-pay | Admitting: Family Medicine

## 2020-04-03 VITALS — BP 118/72 | HR 82 | Temp 98.0°F | Resp 14 | Ht 63.0 in | Wt 158.0 lb

## 2020-04-03 DIAGNOSIS — M79672 Pain in left foot: Secondary | ICD-10-CM

## 2020-04-03 DIAGNOSIS — K861 Other chronic pancreatitis: Secondary | ICD-10-CM

## 2020-04-03 DIAGNOSIS — N1831 Chronic kidney disease, stage 3a: Secondary | ICD-10-CM

## 2020-04-03 DIAGNOSIS — I2510 Atherosclerotic heart disease of native coronary artery without angina pectoris: Secondary | ICD-10-CM | POA: Diagnosis not present

## 2020-04-03 DIAGNOSIS — I739 Peripheral vascular disease, unspecified: Secondary | ICD-10-CM

## 2020-04-03 DIAGNOSIS — I10 Essential (primary) hypertension: Secondary | ICD-10-CM

## 2020-04-03 DIAGNOSIS — E1159 Type 2 diabetes mellitus with other circulatory complications: Secondary | ICD-10-CM | POA: Diagnosis not present

## 2020-04-03 MED ORDER — PANCRELIPASE (LIP-PROT-AMYL) 36000-114000 UNITS PO CPEP: ORAL_CAPSULE | ORAL | 11 refills | Status: DC

## 2020-04-03 MED ORDER — CLOPIDOGREL BISULFATE 75 MG PO TABS
75.0000 mg | ORAL_TABLET | Freq: Every day | ORAL | 3 refills | Status: DC
Start: 2020-04-03 — End: 2021-03-22

## 2020-04-03 MED ORDER — ONDANSETRON HCL 4 MG PO TABS: ORAL_TABLET | ORAL | 1 refills | Status: DC

## 2020-04-03 NOTE — Progress Notes (Signed)
Subjective:    Patient ID: [name], female    DOB: [birth year], 65 y.o.   MRN: [id number]  Patient is a 65 year old Caucasian female with a history of coronary artery disease, type 2 diabetes mellitus, peripheral vascular disease, and chronic pancreatic insufficiency.  She was previously taking Creon three, 36,000 unit tablets with each meal.  That would be 9 tablets total per day.  Her insurance recently forced her to switch to Zenpep.  She states is not working.  She continues to have abdominal bloating and abdominal pain and nausea with every meal.  She is having to take Zofran 2-3 times a day for the nausea that she is experiencing.  Therefore, I feel that she has failed her trial of Zenpep.  She also complains of severe pain in her left foot.  She states this is shooting stabbing pain similar to plantar fasciitis that she has had in the past is that this is in the dorsum of the midfoot.  It hurts from the time she gets up in the morning until the time she goes to bed at night.  Is a throbbing pain.  On visual inspection there is no erythema or bruising or deformity.  She has palpable dorsalis pedis pulses in that area.  However she is exquisitely tender to palpation and has hyperesthesias.  I suspect neuropathy although she would be at risk for a stress fracture given her chronic medical issues  Past Medical History:  Diagnosis Date  . Asthma with COPD ([place])    PATIENT DENIES  . CAD (coronary artery disease)    a. [place] 2011: Diffuse distal and branch vessel disease - patient managed medically, no interventional options. b. [place] 03/2014 - low risk, no ischemia.  . Complication of anesthesia    slow to wake up with last surgery in 2011   . Diabetes mellitus with circulatory complication ([place])   . Dyslipidemia   . Headache    hx of migraines   . Hypertension   . Osteoarthritis    severe right knee  R TKR  . PAD (peripheral artery disease) ([place])    a. Severe stenosis mid right SFA  s/p atherectomy 03/25/09. b. peripheral angiography in 12/2011 which showed only about 50% diffuse right SFA stenosis.  . Pancreatitis 2003  . Renal artery stenosis ([place])    a. [place] 2011 - 40-50% left RAS.  . Tobacco use disorder    quit 11/10   Past Surgical History:  Procedure Laterality Date  . ABDOMINAL AORTAGRAM N/A 12/21/2011   Procedure: ABDOMINAL Maxcine Ham;  Surgeon: Wellington Hampshire, MD;  Location: [facility];  Service: Cardiovascular;  Laterality: N/A;  . ABDOMINAL AORTOGRAM W/LOWER EXTREMITY Bilateral 05/08/2019   Procedure: ABDOMINAL AORTOGRAM W/LOWER EXTREMITY;  Surgeon: Wellington Hampshire, MD;  Location: [facility];  Service: Cardiovascular;  Laterality: Bilateral;  . CARDIAC CATHETERIZATION    . ENDARTERECTOMY FEMORAL Right 10/10/2019   Procedure: RIGHT FEMORAL ENDARTERECTOMY;  Surgeon: Marty Heck, MD;  Location: [facility];  Service: Vascular;  Laterality: Right;  . ESOPHAGOGASTRODUODENOSCOPY (EGD) WITH PROPOFOL N/A 10/05/2017   Procedure: ESOPHAGOGASTRODUODENOSCOPY (EGD) WITH PROPOFOL;  Surgeon: Milus Banister, MD;  Location: [facility];  Service: Endoscopy;  Laterality: N/A;  . EUS N/A 10/05/2017   Procedure: UPPER ENDOSCOPIC ULTRASOUND (EUS) RADIAL;  Surgeon: Milus Banister, MD;  Location: [facility];  Service: Endoscopy;  Laterality: N/A;  . FOOT SURGERY Left   . LOWER EXTREMITY ANGIOGRAM Bilateral 05/27/2015   Procedure:  Lower Extremity Angiogram;  Surgeon: Wellington Hampshire, MD;  Location: [facility];  Service: Cardiovascular;  Laterality: Bilateral;  . PATCH ANGIOPLASTY Right 10/10/2019   Procedure: PATCH ANGIOPLASTY USING [place];  Surgeon: Marty Heck, MD;  Location: [facility];  Service: Vascular;  Laterality: Right;  . PERIPHERAL VASCULAR ATHERECTOMY Left 05/08/2019   Procedure: PERIPHERAL VASCULAR ATHERECTOMY;  Surgeon: Wellington Hampshire, MD;  Location: [facility];  Service: Cardiovascular;  Laterality: Left;  .  PERIPHERAL VASCULAR CATHETERIZATION N/A 05/27/2015   Procedure: Abdominal Aortogram;  Surgeon: Wellington Hampshire, MD;  Location: [facility];  Service: Cardiovascular;  Laterality: N/A;  . PERIPHERAL VASCULAR CATHETERIZATION Right 05/27/2015   Procedure: Peripheral Vascular Intervention;  Surgeon: Wellington Hampshire, MD;  Location: [facility];  Service: Cardiovascular;  Laterality: Right;  SFA  . TOTAL KNEE ARTHROPLASTY Right 2011  . TOTAL KNEE ARTHROPLASTY Left 05/22/2014   Procedure: LEFT TOTAL KNEE ARTHROPLASTY;  Surgeon: Latanya Maudlin, MD;  Location: [facility];  Service: Orthopedics;  Laterality: Left;  . TUBAL LIGATION  1980   Current Outpatient Medications on File Prior to Visit  Medication Sig Dispense Refill  . acetaminophen (TYLENOL) 500 MG tablet Take 1,000 mg by mouth every 6 (six) hours as needed for moderate pain or headache.    . ADVAIR DISKUS 250-50 MCG/DOSE AEPB INHALE 1 PUFF BY MOUTH INTO THE LUNGS EVERY 12 HOURS (Patient taking differently: Inhale 1 puff into the lungs 2 (two) times daily.) 60 each 5  . aspirin 81 MG tablet Take 81 mg by mouth daily.    . Biotin w/ Vitamins C & E (HAIR/SKIN/NAILS PO) Take 1 tablet by mouth daily.    . bisoprolol (ZEBETA) 5 MG tablet TAKE ONE-HALF TABLET BY MOUTH EVERY DAY (Patient taking differently: Take 2.5 mg by mouth at bedtime.) 90 tablet 3  . Blood Glucose Monitoring Suppl (BAYER CONTOUR MONITOR) w/Device KIT CHECK BLOOD SUGAR EVERY DAY 1 kit 0  . Blood Glucose Monitoring Suppl (ONE TOUCH ULTRA 2) w/Device KIT Check BS daily 1 kit 0  . diclofenac sodium (VOLTAREN) 1 % GEL Apply 2 g topically 4 (four) times daily. (Patient taking differently: Apply 2 g topically 4 (four) times daily as needed (for hand pain).) 100 g 0  . HYDROcodone-acetaminophen (NORCO/VICODIN) 5-325 MG tablet Take 1 tablet by mouth every 6 (six) hours as needed for moderate pain. Pain Management, [place] 130 tablet 0  . isosorbide mononitrate (IMDUR) 60 MG 24 hr  tablet TAKE 1 TABLET BY MOUTH DAILY (Patient taking differently: Take 60 mg by mouth daily.) 90 tablet 2  . metFORMIN (GLUCOPHAGE) 1000 MG tablet TAKE 1 TABLET BY MOUTH TWICE DAILY WITH MEALS (Patient taking differently: Take 1,000 mg by mouth in the morning and at bedtime. Morning & afternoon.) 180 tablet 0  . methocarbamol (ROBAXIN) 500 MG tablet TAKE 1 TABLET BY MOUTH EVERY 6 HOURS AS NEEDED FOR MUSCLE SPASM 120 tablet 2  . Multiple Vitamin (MULTIVITAMIN) tablet Take 1 tablet by mouth daily.    . nitroGLYCERIN (NITROSTAT) 0.4 MG SL tablet Place 1 tablet under the tongue every 5 (five) minutes as needed for chest pain.    . Pancrelipase, Lip-Prot-Amyl, (ZENPEP) 20000-63000 units CPEP Take 3 capsules by mouth with breakfast, with lunch, and with evening meal. 90 capsule 3  . pantoprazole (PROTONIX) 40 MG tablet TAKE 1 TABLET BY MOUTH DAILY 90 tablet 3  . pioglitazone (ACTOS) 30 MG tablet Take 1 tablet (30 mg  total) by mouth daily. 90 tablet 3  . Probiotic Product (TRUBIOTICS PO) Take 1 capsule by mouth daily.    . rosuvastatin (CRESTOR) 40 MG tablet Take 1 tablet (40 mg total) by mouth at bedtime. 90 tablet 3  . VENTOLIN HFA 108 (90 Base) MCG/ACT inhaler INHALE 2 PUFFS BY MOUTH TWICE DAILY AS NEEDED FOR WHEEZING OR SHORTNESS OF BREATH (Patient taking differently: Inhale 1 puff into the lungs 2 (two) times daily as needed for wheezing or shortness of breath.) 18 g 3   No current facility-administered medications on file prior to visit.   Allergies  Allergen Reactions  . Gabapentin Other (See Comments)    Brain shakes  . Glucotrol [Glipizide] Rash and Other (See Comments)    Red rash  . Influenza Vaccines Other (See Comments)    Significant arm soreness requiring 1 year of physical therapy  . Latex Rash  . Nortriptyline Other (See Comments)    BRAIN SHAKE  . Tetanus Toxoid Hives, Swelling, Rash and Other (See Comments)    Swelling to site of injection with rash and fever  . Other Itching,  Other (See Comments) and Cough    Patient is highly allergic to Cats and she begins to break out in rash and turns red if someone caring for her has cats at home.  Patient takes Claritin for this type of reaction.  Patient stated that if it goes untreated then he begins to have difficulty breathing  . Invokana [Canagliflozin] Other (See Comments)    Yeast Infections   Social History   Socioeconomic History  . Marital status: Married    Spouse name: Not on file  . Number of children: Not on file  . Years of education: 58  . Highest education level: Not on file  Occupational History  . Not on file  Tobacco Use  . Smoking status: Former Smoker    Quit date: 12/13/2008    Years since quitting: 11.3  . Smokeless tobacco: Never Used  Vaping Use  . Vaping Use: Never used  Substance and Sexual Activity  . Alcohol use: No    Comment: Quit drinking around year 2000  . Drug use: No    Comment: Patient quit smoking around 2011  . Sexual activity: Yes    Partners: Male  Other Topics Concern  . Not on file  Social History Narrative   Denies any IV drug use or marijuana use.  Former smoker for 30 years, quit Nov. 2010. She is married with 2 children. Disabled from knee, not working.   Social Determinants of Health   Financial Resource Strain: Not on file  Food Insecurity: Not on file  Transportation Needs: Not on file  Physical Activity: Not on file  Stress: Not on file  Social Connections: Not on file  Intimate Partner Violence: Not on file   Family History  Problem Relation Age of Onset  . CVA Mother   . Emphysema Father   . Breast cancer Sister 85     Review of Systems  All other systems reviewed and are negative.      Objective:   Physical Exam Vitals reviewed.  Constitutional:      General: She is not in acute distress.    Appearance: She is well-developed. She is not diaphoretic.  HENT:     Right Ear: External ear normal.     Left Ear: External ear normal.      Nose: Nose normal.  Eyes:     Conjunctiva/sclera:  Conjunctivae normal.  Neck:     Thyroid: Thyromegaly present.     Vascular: No JVD.  Cardiovascular:     Rate and Rhythm: Normal rate and regular rhythm.     Heart sounds: Normal heart sounds. No murmur heard.   Pulmonary:     Effort: Pulmonary effort is normal.     Breath sounds: Normal breath sounds. No wheezing or rales.  Chest:     Chest wall: No tenderness.  Abdominal:     General: Bowel sounds are normal. There is no distension.     Palpations: Abdomen is soft. There is no mass.     Tenderness: There is no abdominal tenderness. There is no guarding or rebound.  Musculoskeletal:     Cervical back: Neck supple.     Left foot: Tenderness and bony tenderness present.       Feet:  Lymphadenopathy:     Cervical: No cervical adenopathy.  Skin:    Findings: No erythema or rash.  Neurological:     Mental Status: She is alert and oriented to person, place, and time.     Cranial Nerves: No cranial nerve deficit.     Motor: No abnormal muscle tone.     Coordination: Coordination normal.     Deep Tendon Reflexes: Reflexes are normal and symmetric.         Assessment & Plan:   Type 2 diabetes mellitus with other circulatory complication, without long-term current use of insulin (HCC) - Plan: Hemoglobin A1c, CBC with Differential/Platelet, COMPLETE METABOLIC PANEL WITH GFR, Lipid panel, Microalbumin, urine  PAD (peripheral artery disease) (HCC)  Coronary artery disease involving native heart without angina pectoris, unspecified vessel or lesion type  Essential hypertension  Stage 3a chronic kidney disease (HCC)  Left foot pain - Plan: DG Foot Complete Left  Idiopathic chronic pancreatitis ([place]) - Plan: ondansetron (ZOFRAN) 4 MG tablet Patient's blood pressure today is acceptable.  I will check an A1c.  Goal A1c is less than 6.5.  Given her history of coronary artery disease and peripheral artery disease, I will check a  fasting lipid panel.  I had like her LDL cholesterol to be below 70.  I will continue to monitor her chronic kidney disease.  I will check a urine microalbumin and I hope to see her microalbumin to creatinine ratio less than 30.  Regarding her foot pain, I suspect neuropathy due to the diabetes given the hyperesthesias.  However I will check an x-ray of the foot to rule out a stress fracture or arthritis in the midfoot as a potential cause.  If the x-ray is normal, I would recommend treating this is neuropathic pain.  Patient has tried Zenpep.  She is seeing significant bloating and pain and nausea.  She was doing much better on Creon.  Therefore I sent a refill on Creon, 36,000 units, 3 tablets with each meal, 9 tablets/day.  Also refilled her Zofran in case her insurance will not cover the Creon

## 2020-04-04 LAB — COMPLETE METABOLIC PANEL WITH GFR
AG Ratio: 1.9 (calc) (ref 1.0–2.5)
ALT: 14 U/L (ref 6–29)
AST: 18 U/L (ref 10–35)
Albumin: 4.5 g/dL (ref 3.6–5.1)
Alkaline phosphatase (APISO): 84 U/L (ref 37–153)
BUN/Creatinine Ratio: 22 (calc) (ref 6–22)
BUN: 22 mg/dL (ref 7–25)
CO2: 24 mmol/L (ref 20–32)
Calcium: 10.3 mg/dL (ref 8.6–10.4)
Chloride: 102 mmol/L (ref 98–110)
Creat: 1.01 mg/dL — ABNORMAL HIGH (ref 0.50–0.99)
GFR, Est African American: 69 mL/min/{1.73_m2} (ref >=60)
GFR, Est Non African American: 59 mL/min/{1.73_m2} — ABNORMAL LOW (ref >=60)
Globulin: 2.4 g/dL (calc) (ref 1.9–3.7)
Glucose, Bld: 104 mg/dL — ABNORMAL HIGH (ref 65–99)
Potassium: 4.7 mmol/L (ref 3.5–5.3)
Sodium: 138 mmol/L (ref 135–146)
Total Bilirubin: 0.2 mg/dL (ref 0.2–1.2)
Total Protein: 6.9 g/dL (ref 6.1–8.1)

## 2020-04-04 LAB — CBC WITH DIFFERENTIAL/PLATELET
Absolute Monocytes: 497 cells/uL (ref 200–950)
Basophils Absolute: 63 cells/uL (ref 0–200)
Basophils Relative: 0.9 %
Eosinophils Absolute: 441 cells/uL (ref 15–500)
Eosinophils Relative: 6.3 %
HCT: 39.1 % (ref 35.0–45.0)
Hemoglobin: 12.4 g/dL (ref 11.7–15.5)
Lymphs Abs: 1771 cells/uL (ref 850–3900)
MCH: 28.8 pg (ref 27.0–33.0)
MCHC: 31.7 g/dL — ABNORMAL LOW (ref 32.0–36.0)
MCV: 90.7 fL (ref 80.0–100.0)
MPV: 9.2 fL (ref 7.5–12.5)
Monocytes Relative: 7.1 %
Neutro Abs: 4228 cells/uL (ref 1500–7800)
Neutrophils Relative %: 60.4 %
Platelets: 303 10*3/uL (ref 140–400)
RBC: 4.31 10*6/uL (ref 3.80–5.10)
RDW: 13.8 % (ref 11.0–15.0)
Total Lymphocyte: 25.3 %
WBC: 7 10*3/uL (ref 3.8–10.8)

## 2020-04-04 LAB — MICROALBUMIN, URINE: Microalb, Ur: 0.3 mg/dL

## 2020-04-04 LAB — LIPID PANEL
Cholesterol: 154 mg/dL (ref <200)
HDL: 56 mg/dL (ref >=50)
LDL Cholesterol (Calc): 71 mg/dL (calc)
Non-HDL Cholesterol (Calc): 98 mg/dL (calc) (ref <130)
Total CHOL/HDL Ratio: 2.8 (calc) (ref <5.0)
Triglycerides: 207 mg/dL — ABNORMAL HIGH (ref <150)

## 2020-04-04 LAB — HEMOGLOBIN A1C
Hgb A1c MFr Bld: 6.6 % of total Hgb — ABNORMAL HIGH (ref <5.7)
Mean Plasma Glucose: 143 mg/dL
eAG (mmol/L): 7.9 mmol/L

## 2020-04-06 ENCOUNTER — Ambulatory Visit (INDEPENDENT_AMBULATORY_CARE_PROVIDER_SITE_OTHER): Payer: 59 | Admitting: Nurse Practitioner

## 2020-04-06 ENCOUNTER — Other Ambulatory Visit: Payer: Self-pay

## 2020-04-06 VITALS — BP 118/78 | HR 72 | Temp 97.2°F | Ht 63.0 in | Wt 157.2 lb

## 2020-04-06 DIAGNOSIS — Z01419 Encounter for gynecological examination (general) (routine) without abnormal findings: Secondary | ICD-10-CM | POA: Diagnosis not present

## 2020-04-06 NOTE — Patient Instructions (Signed)
Preventing Cervical Cancer Cervical cancer is cancer that grows on the cervix. The cervix is at the bottom of the uterus. It connects the uterus to the vagina. The uterus is where a baby develops during pregnancy. Cancer occurs when cells become abnormal and start to grow out of control. If cervical cancer is not found early, it can spread and become dangerous. Cervical cancer cannot always be prevented, but you can take steps to lower your risk of developing this condition. How can this condition affect me? Cervical cancer grows slowly and may not cause any symptoms at first. Over time, the cancer can grow deep into the cervix tissue and spread to other areas. This may take years, and it may happen without you knowing about it. If it is found early, cervical cancer can be treated effectively. If the cancer has grown deep into your cervix or has spread, it will be more difficult to treat. Most cases of cervical cancer are caused by an STI (sexually transmitted infection) called human papillomavirus (HPV). One way to reduce your risk of cervical cancer is to take steps to avoid infection with the HPV virus. Getting regular Pap tests is also important because this can help identify changes in cells that could lead to cancer. Your chances of getting this disease can also be reduced by making certain lifestyle changes. What can increase my risk? You are more likely to develop this condition if:  You have certain things in your sexual history, such as: ? Having a sexually transmitted viral infection. These include chlamydia and herpes. ? Having more than one sexual partner, or having sex with someone who has more than one sexual partner. ? Not using condoms during sex. ? Having been sexually active before the age of 28.  Your mother took a medicine called diethylstilbestrol (DES) while pregnant with you, causing you to be exposed to this medicine before birth.  Your mother or sister has had cervical  cancer.  You are between the ages of 66-50.  You have or have had certain other medical conditions, such as: ? Previous cancer of the vagina or vulva. ? A weakened body defense system (immune system). ? A history of dysplasia of the cervix.  You use oral contraceptives, also called birth control pills.  You smoke or breathe in secondhand smoke. What actions can I take to prevent cervical cancer? Preventing HPV infection  Ask your health care provider about getting the HPV vaccine. If you are 71 years old or younger, you may need to get this vaccine, which is given in three doses over 6 months. This vaccine protects against the types of HPV that could cause cancer.  Limit the number of people you have sex with. Also avoid having sex with people who have had many sex partners.  Use a latex condom every time you have sex.   Getting Pap tests Get Pap tests regularly, starting at age 78. Talk with your health care provider about how often you need these tests. Having regular Pap tests will help identify changes in cells that could lead to cancer. Steps can then be taken to prevent cancer from developing.  Most women who are 19?63 years of age should have a Pap test every 3 years.  Most women who are 73?63 years of age should have a Pap test in combination with an HPV test every 5 years.  Women with a higher risk of cervical cancer, such as those with a weakened immune system or those who  were exposed to DES medicine before birth, may need more frequent testing. Making other lifestyle changes  Do not use any products that contain nicotine or tobacco, such as cigarettes, e-cigarettes, and chewing tobacco. If you need help quitting, ask your health care provider.  Eat a healthy diet that includes at least 5 servings of fruits and vegetables every day.  Lose weight if you are overweight.   Where to find support Talk with your health care provider, school nurse, or local health department  for guidance about screening and vaccination. Some children and teens may be able to get the HPV vaccine free of charge through the U.S. government's Vaccines for Children (VFC) program. Other places that provide vaccinations include:  Public health clinics. Check with your local health department.  Federally Qualified Health Centers, where you would pay only what you can afford. To find one near you, check this website: www.fqhc.org/find-an-fqhc/  Rural Health Clinics. These are part of a program for Medicare and Medicaid patients who live in rural areas. The National Breast and Cervical Cancer Early Detection Program also provides breast and cervical cancer screenings and diagnostic services to low-income, uninsured, and underinsured women. Cervical cancer can be passed down through families. Talk with your health care provider or a genetic counselor to learn more about genetic testing for cancer. Where to find more information Learn more about cervical cancer from:  American College of Gynecology: www.acog.org  American Cancer Society: www.cancer.org  Centers for Disease Control and Prevention: www.cdc.gov Contact a health care provider if you have:  Pelvic pain.  Unusual discharge or bleeding from your vagina. Summary  Cervical cancer is cancer that grows on the cervix. The cervix is at the bottom of the uterus.  Ask your health care provider about getting the HPV vaccine.  Be sure to get regular Pap tests as recommended by your health care provider.  See your health care provider right away if you have any pelvic pain or unusual discharge or bleeding from your vagina. This information is not intended to replace advice given to you by your health care provider. Make sure you discuss any questions you have with your health care provider. Document Revised: 09/03/2018 Document Reviewed: 09/03/2018 Elsevier Patient Education  2021 [employer]  

## 2020-04-06 NOTE — Progress Notes (Signed)
Subjective:    Patient ID: [name], female    DOB: 12/02/1955, 65 y.o.   MRN: [id number]  HPI: [name] is a 65 y.o. female presenting for pap smear.  Chief Complaint  Patient presents with  . Gynecologic Exam    Very nervous about very getting pap today, has prosthetic knees cannot bend them for procedure   Here for gynecologic examination.  No other questions or concerns. Last pap smear in 2018 NILM.  Denies vaginal concerns or complaints today.  Allergies  Allergen Reactions  . Gabapentin Other (See Comments)    Brain shakes  . Glucotrol [Glipizide] Rash and Other (See Comments)    Red rash  . Influenza Vaccines Other (See Comments)    Significant arm soreness requiring 1 year of physical therapy  . Latex Rash  . Nortriptyline Other (See Comments)    BRAIN SHAKE  . Tetanus Toxoid Hives, Swelling, Rash and Other (See Comments)    Swelling to site of injection with rash and fever  . Other Itching, Other (See Comments) and Cough    Patient is highly allergic to Cats and she begins to break out in rash and turns red if someone caring for her has cats at home.  Patient takes Claritin for this type of reaction.  Patient stated that if it goes untreated then he begins to have difficulty breathing  . Invokana [Canagliflozin] Other (See Comments)    Yeast Infections    Outpatient Encounter Medications as of 04/06/2020  Medication Sig  . acetaminophen (TYLENOL) 500 MG tablet Take 1,000 mg by mouth every 6 (six) hours as needed for moderate pain or headache.  . ADVAIR DISKUS 250-50 MCG/DOSE AEPB INHALE 1 PUFF BY MOUTH INTO THE LUNGS EVERY 12 HOURS (Patient taking differently: Inhale 1 puff into the lungs 2 (two) times daily.)  . aspirin 81 MG tablet Take 81 mg by mouth daily.  . Biotin w/ Vitamins C & E (HAIR/SKIN/NAILS PO) Take 1 tablet by mouth daily.  . bisoprolol (ZEBETA) 5 MG tablet TAKE ONE-HALF TABLET BY MOUTH EVERY DAY (Patient taking differently: Take  2.5 mg by mouth at bedtime.)  . Blood Glucose Monitoring Suppl (BAYER CONTOUR MONITOR) w/Device KIT CHECK BLOOD SUGAR EVERY DAY  . Blood Glucose Monitoring Suppl (ONE TOUCH ULTRA 2) w/Device KIT Check BS daily  . clopidogrel (PLAVIX) 75 MG tablet Take 1 tablet (75 mg total) by mouth daily.  . diclofenac sodium (VOLTAREN) 1 % GEL Apply 2 g topically 4 (four) times daily. (Patient taking differently: Apply 2 g topically 4 (four) times daily as needed (for hand pain).)  . HYDROcodone-acetaminophen (NORCO/VICODIN) 5-325 MG tablet Take 1 tablet by mouth every 6 (six) hours as needed for moderate pain. Pain [facility], [place]  . isosorbide mononitrate (IMDUR) 60 MG 24 hr tablet TAKE 1 TABLET BY MOUTH DAILY (Patient taking differently: Take 60 mg by mouth daily.)  . lipase/protease/amylase (CREON) 36000 UNITS CPEP capsule Take 3 capsules (108,000 Units total) by mouth 3 (three) times daily with meals. May also take 1 capsule (36,000 Units total) as needed (with snacks).  . metFORMIN (GLUCOPHAGE) 1000 MG tablet TAKE 1 TABLET BY MOUTH TWICE DAILY WITH MEALS (Patient taking differently: Take 1,000 mg by mouth in the morning and at bedtime. Morning & afternoon.)  . methocarbamol (ROBAXIN) 500 MG tablet TAKE 1 TABLET BY MOUTH EVERY 6 HOURS AS NEEDED FOR MUSCLE SPASM  . Multiple Vitamin (MULTIVITAMIN) tablet Take 1 tablet by mouth daily.  Marland Kitchen  nitroGLYCERIN (NITROSTAT) 0.4 MG SL tablet Place 1 tablet under the tongue every 5 (five) minutes as needed for chest pain.  Marland Kitchen ondansetron (ZOFRAN) 4 MG tablet TAKE 1 TABLET(4 MG) BY MOUTH EVERY 8 HOURS AS NEEDED FOR NAUSEA OR VOMITING  . Pancrelipase, Lip-Prot-Amyl, (ZENPEP) 20000-63000 units CPEP Take 3 capsules by mouth with breakfast, with lunch, and with evening meal.  . pantoprazole (PROTONIX) 40 MG tablet TAKE 1 TABLET BY MOUTH DAILY  . pioglitazone (ACTOS) 30 MG tablet Take 1 tablet (30 mg total) by mouth daily.  . Probiotic Product (TRUBIOTICS PO) Take 1 capsule  by mouth daily.  . rosuvastatin (CRESTOR) 40 MG tablet Take 1 tablet (40 mg total) by mouth at bedtime.  . VENTOLIN HFA 108 (90 Base) MCG/ACT inhaler INHALE 2 PUFFS BY MOUTH TWICE DAILY AS NEEDED FOR WHEEZING OR SHORTNESS OF BREATH (Patient taking differently: Inhale 1 puff into the lungs 2 (two) times daily as needed for wheezing or shortness of breath.)   No facility-administered encounter medications on file as of 04/06/2020.    Patient Active Problem List   Diagnosis Date Noted  . 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 Chronic pancreatitis ([place]) 09/28/2018  . Asthma with COPD ([place])   . CKD (chronic kidney disease), stage III ([place])   . Renal artery stenosis ([place])   . CAD (coronary artery disease)   . Diabetes mellitus with circulatory complication ([place])   . Dyslipidemia   . History of bilateral knee replacement 05/22/2014  . Cutaneous skin tags 09/09/2012  . Bilateral leg pain 07/20/2012  . Arthritis of knee 10/25/2011  . GERD (gastroesophageal reflux disease) 11/04/2010  . PAD (peripheral artery disease) ([place]) 01/30/2009  . Diabetes mellitus, type II ([place]) 01/02/2009  . TOBACCO ABUSE 01/02/2009  . Essential hypertension 01/02/2009    Past Medical History:  Diagnosis Date  . Asthma with COPD ([place])    PATIENT DENIES  . CAD (coronary artery disease)    a. [place] 2011: Diffuse distal and branch vessel disease - patient managed medically, no interventional options. b. Nuc 03/2014 - low risk, no ischemia.  . Complication of anesthesia    slow to wake up with last surgery in 2011   . Diabetes mellitus with circulatory complication ([place])   . Dyslipidemia   . Headache    hx of migraines   . Hypertension   . Osteoarthritis    severe right knee  R TKR  . PAD (peripheral artery disease) ([place])    a. Severe stenosis mid right SFA s/p atherectomy 03/25/09. b. peripheral angiography in 12/2011 which showed only about 50% diffuse right SFA stenosis.  . Pancreatitis 2003  . Renal  artery stenosis ([place])    a. [place] 2011 - 40-50% left RAS.  . Tobacco use disorder    quit 11/10    Relevant past medical, surgical, family and social history reviewed and updated as indicated. Interim medical history since our last visit reviewed.  Review of Systems  Constitutional: Negative.   Genitourinary: Negative.   Skin: Negative.   Psychiatric/Behavioral: Negative.    Per HPI unless specifically indicated above     Objective:    BP 118/78   Pulse 72   Temp (!) 97.2 F (36.2 C)   Ht 5' 3" (1.6 m)   Wt 157 lb 3.2 oz (71.3 kg)   SpO2 97%   BMI 27.85 kg/m   Wt Readings from Last 3 Encounters:  04/06/20 157 lb 3.2 oz (71.3 kg)  04/03/20 158 lb (  71.7 kg)  12/18/19 148 lb (67.1 kg)    Physical Exam Vitals and nursing note reviewed. Exam conducted with a chaperone present.  Constitutional:      General: She is not in acute distress.    Appearance: Normal appearance. She is not toxic-appearing.  Genitourinary:    General: Normal vulva.     Exam position: Lithotomy position.     Pubic Area: No rash or pubic lice.      Labia:        Right: No rash, lesion or injury.        Left: No rash, lesion or injury.      Urethra: No urethral pain, urethral swelling or urethral lesion.     Vagina: Normal. No erythema, tenderness or bleeding.     Cervix: No cervical motion tenderness, discharge, friability, erythema or cervical bleeding.     Uterus: Normal.      Adnexa: Right adnexa normal and left adnexa normal.       Right: No mass, tenderness or fullness.         Left: No mass, tenderness or fullness.    Skin:    General: Skin is warm and dry.     Coloration: Skin is not jaundiced or pale.     Findings: No erythema.  Neurological:     Mental Status: She is alert.  Psychiatric:        Mood and Affect: Mood normal.        Behavior: Behavior normal.        Thought Content: Thought content normal.        Judgment: Judgment normal.      Results for orders placed or  performed in visit on 04/03/20  Hemoglobin A1c  Result Value Ref Range   Hgb A1c MFr Bld 6.6 (H) <5.7 % of total Hgb   Mean Plasma Glucose 143 mg/dL   eAG (mmol/L) 7.9 mmol/L  CBC with Differential/Platelet  Result Value Ref Range   WBC 7.0 3.8 - 10.8 Thousand/uL   RBC 4.31 3.80 - 5.10 Million/uL   Hemoglobin 12.4 11.7 - 15.5 g/dL   HCT 39.1 35.0 - 45.0 %   MCV 90.7 80.0 - 100.0 fL   MCH 28.8 27.0 - 33.0 pg   MCHC 31.7 (L) 32.0 - 36.0 g/dL   RDW 13.8 11.0 - 15.0 %   Platelets 303 140 - 400 Thousand/uL   MPV 9.2 7.5 - 12.5 fL   Neutro Abs 4,228 1,500 - 7,800 cells/uL   Lymphs Abs 1,771 850 - 3,900 cells/uL   Absolute Monocytes 497 200 - 950 cells/uL   Eosinophils Absolute 441 15 - 500 cells/uL   Basophils Absolute 63 0 - 200 cells/uL   Neutrophils Relative % 60.4 %   Total Lymphocyte 25.3 %   Monocytes Relative 7.1 %   Eosinophils Relative 6.3 %   Basophils Relative 0.9 %  COMPLETE METABOLIC PANEL WITH GFR  Result Value Ref Range   Glucose, Bld 104 (H) 65 - 99 mg/dL   BUN 22 7 - 25 mg/dL   Creat 1.01 (H) 0.50 - 0.99 mg/dL   GFR, Est Non African American 59 (L) > OR = 60 mL/min/1.64m   GFR, Est African American 69 > OR = 60 mL/min/1.768m  BUN/Creatinine Ratio 22 6 - 22 (calc)   Sodium 138 135 - 146 mmol/L   Potassium 4.7 3.5 - 5.3 mmol/L   Chloride 102 98 - 110 mmol/L   CO2 24 20 - 32  mmol/L   Calcium 10.3 8.6 - 10.4 mg/dL   Total Protein 6.9 6.1 - 8.1 g/dL   Albumin 4.5 3.6 - 5.1 g/dL   Globulin 2.4 1.9 - 3.7 g/dL (calc)   AG Ratio 1.9 1.0 - 2.5 (calc)   Total Bilirubin 0.2 0.2 - 1.2 mg/dL   Alkaline phosphatase (APISO) 84 37 - 153 U/L   AST 18 10 - 35 U/L   ALT 14 6 - 29 U/L  Lipid panel  Result Value Ref Range   Cholesterol 154 <200 mg/dL   HDL 56 > OR = 50 mg/dL   Triglycerides 207 (H) <150 mg/dL   LDL Cholesterol (Calc) 71 mg/dL (calc)   Total CHOL/HDL Ratio 2.8 <5.0 (calc)   Non-HDL Cholesterol (Calc) 98 <130 mg/dL (calc)  Microalbumin, urine  Result  Value Ref Range   Microalb, Ur 0.3 mg/dL   RAM        Assessment & Plan:  1. Gynecologic exam normal GYN examination today normal.  Pap performed, will follow.  - PAP, Thin Prep w/HPV rflx HPV Type 16/18   Follow up plan: Return if symptoms worsen or fail to improve.

## 2020-04-07 ENCOUNTER — Other Ambulatory Visit: Payer: Self-pay | Admitting: *Deleted

## 2020-04-07 ENCOUNTER — Telehealth: Payer: Self-pay | Admitting: *Deleted

## 2020-04-07 MED ORDER — GABAPENTIN 300 MG PO CAPS: 300.0000 mg | ORAL_CAPSULE | Freq: Three times a day (TID) | ORAL | 3 refills | Status: DC | PRN

## 2020-04-07 NOTE — Telephone Encounter (Signed)
Received request from pharmacy for PA on Creon.   PA submitted.   Dx: K86.1- chronic pancreatitis.   Patient has tried and failed formulary alternative ZenPep.   [employer] is reviewing your PA request. You may close this dialog, return to your dashboard, and perform other tasks.  To check for an update later, open this request again from your dashboard. If [employer] has not replied within 24 hours for urgent requests or within 48 hours for standard requests, please contact [employer] at [phone].

## 2020-04-08 ENCOUNTER — Encounter: Payer: Self-pay | Admitting: *Deleted

## 2020-04-08 LAB — PAP, TP IMAGING W/ HPV RNA, RFLX HPV TYPE 16,18/45: HPV DNA High Risk: NOT DETECTED

## 2020-04-09 ENCOUNTER — Telehealth: Payer: Self-pay | Admitting: Family Medicine

## 2020-04-09 MED ORDER — ZENPEP 20000-63000 UNITS PO CPEP: 3.0000 | ORAL_CAPSULE | Freq: Three times a day (TID) | ORAL | 3 refills | Status: DC

## 2020-04-09 NOTE — Telephone Encounter (Signed)
Received call from patient.   Reports that  Creon Co-Pay is >$300 per month.   States that she will continue ZenPep at this time. Prescription sent to pharmacy.   Advised to attempt prescription assistance.

## 2020-04-09 NOTE — Telephone Encounter (Signed)
Pt called asking for the nurse to call her back she has questions about this med  lipase/protease/amylase (CREON) 36000 UNITS  Cb#: [phone]

## 2020-04-09 NOTE — Telephone Encounter (Signed)
Received PA determination.   PA approved 04/07/2020- 04/06/2021.  Call placed to patient and patient made aware.

## 2020-04-13 ENCOUNTER — Other Ambulatory Visit: Payer: Self-pay | Admitting: Family Medicine

## 2020-04-13 NOTE — Telephone Encounter (Signed)
CB# [phone]   Patient called in requesting a refill on her HYDROcodone-acetaminophen (NORCO/VICODIN) 5-325 MG tablet

## 2020-04-14 MED ORDER — HYDROCODONE-ACETAMINOPHEN 5-325 MG PO TABS: 1.0000 | ORAL_TABLET | Freq: Four times a day (QID) | ORAL | 0 refills | Status: DC | PRN

## 2020-04-14 NOTE — Telephone Encounter (Signed)
Ok to refill??  Last office visit 04/06/2020.  Last refill 03/17/2020.

## 2020-04-16 ENCOUNTER — Other Ambulatory Visit: Payer: Self-pay | Admitting: Family Medicine

## 2020-04-23 ENCOUNTER — Telehealth: Payer: Self-pay | Admitting: Family Medicine

## 2020-04-23 NOTE — Telephone Encounter (Signed)
Awaiting forms

## 2020-04-23 NOTE — Telephone Encounter (Signed)
Pt came in to drop off paper work for a medication. Form placed in nurse's folder. Please call when ready to pick up.   Cb#: [phone]

## 2020-04-27 NOTE — Telephone Encounter (Signed)
Prescription assistance for Creon completed and placed on desk for signature.

## 2020-04-28 ENCOUNTER — Telehealth: Payer: Self-pay

## 2020-04-29 ENCOUNTER — Other Ambulatory Visit: Payer: Self-pay | Admitting: Family Medicine

## 2020-04-29 ENCOUNTER — Other Ambulatory Visit: Payer: Self-pay

## 2020-04-29 MED ORDER — PREGABALIN 75 MG PO CAPS: 75.0000 mg | ORAL_CAPSULE | Freq: Two times a day (BID) | ORAL | 1 refills | Status: DC

## 2020-04-29 NOTE — Telephone Encounter (Signed)
D/c gabapentin and replace with lyrica 75 mg pobid.

## 2020-04-29 NOTE — Telephone Encounter (Signed)
Pt is aware to stop the gabapentin and to take the lyrica po bid

## 2020-05-04 ENCOUNTER — Telehealth: Payer: Self-pay

## 2020-05-04 NOTE — Telephone Encounter (Signed)
I would be glad to see her to try to determine nxt options.

## 2020-05-04 NOTE — Telephone Encounter (Signed)
Please advise 

## 2020-05-04 NOTE — Telephone Encounter (Signed)
Patient called said the med given pregablin (Lyrica) 75 mg given makes her so drowsy and staggers. Anything else she can take cb# [phone].

## 2020-05-04 NOTE — Telephone Encounter (Signed)
Call placed to patient and patient made aware.   States that she is having issues with transportation at this time. Will call back once resolved to schedule  OV.

## 2020-05-08 ENCOUNTER — Encounter: Payer: Self-pay | Admitting: Family Medicine

## 2020-05-08 ENCOUNTER — Other Ambulatory Visit: Payer: Self-pay

## 2020-05-08 ENCOUNTER — Ambulatory Visit (INDEPENDENT_AMBULATORY_CARE_PROVIDER_SITE_OTHER): Payer: 59 | Admitting: Family Medicine

## 2020-05-08 VITALS — BP 136/68 | HR 84 | Temp 97.9°F | Resp 16 | Ht 63.0 in | Wt 159.0 lb

## 2020-05-08 DIAGNOSIS — E1140 Type 2 diabetes mellitus with diabetic neuropathy, unspecified: Secondary | ICD-10-CM

## 2020-05-08 DIAGNOSIS — M79672 Pain in left foot: Secondary | ICD-10-CM | POA: Diagnosis not present

## 2020-05-08 MED ORDER — AMITRIPTYLINE HCL 50 MG PO TABS: 50.0000 mg | ORAL_TABLET | Freq: Every day | ORAL | 0 refills | Status: DC

## 2020-05-08 NOTE — Progress Notes (Signed)
Subjective:    Patient ID: [name], female    DOB: [birth year], 65 y.o.   MRN: [id number]   Patient recently came in complaining of pain in her left foot.  Please see the last office visit.  The x-ray showed no abnormalities.  She continues to report a lightening-like pain in the dorsum of her left foot.  It radiates down into the third webspace.  The pain sounds neuropathic in nature.  We tried her on gabapentin but she states that this made her feel like she was "drunk".  We also tried her on Lyrica but this also had the same effect and another medication help with the pain.  She had ABIs performed in October 2021 that were within normal limits and did not suggest any peripheral vascular disease as a cause of foot pain.  Therefore I believe that this is likely neuropathic in nature. Past Medical History:  Diagnosis Date  . Asthma with COPD ([place])    PATIENT DENIES  . CAD (coronary artery disease)    a. [place] 2011: Diffuse distal and branch vessel disease - patient managed medically, no interventional options. b. Nuc 03/2014 - low risk, no ischemia.  . Complication of anesthesia    slow to wake up with last surgery in 2011   . Diabetes mellitus with circulatory complication ([place])   . Dyslipidemia   . Headache    hx of migraines   . Hypertension   . Osteoarthritis    severe right knee  R TKR  . PAD (peripheral artery disease) ([place])    a. Severe stenosis mid right SFA s/p atherectomy 03/25/09. b. peripheral angiography in 12/2011 which showed only about 50% diffuse right SFA stenosis.  . Pancreatitis 2003  . Renal artery stenosis ([place])    a. [place] 2011 - 40-50% left RAS.  . Tobacco use disorder    quit 11/10   Past Surgical History:  Procedure Laterality Date  . ABDOMINAL AORTAGRAM N/A 12/21/2011   Procedure: ABDOMINAL Maxcine [place];  Surgeon: Wellington Hampshire, MD;  Location: [facility];  Service: Cardiovascular;  Laterality: N/A;  . ABDOMINAL AORTOGRAM W/LOWER EXTREMITY Bilateral  05/08/2019   Procedure: ABDOMINAL AORTOGRAM W/LOWER EXTREMITY;  Surgeon: Wellington Hampshire, MD;  Location: [facility];  Service: Cardiovascular;  Laterality: Bilateral;  . CARDIAC CATHETERIZATION    . ENDARTERECTOMY FEMORAL Right 10/10/2019   Procedure: RIGHT FEMORAL ENDARTERECTOMY;  Surgeon: Marty Heck, MD;  Location: [facility];  Service: Vascular;  Laterality: Right;  . ESOPHAGOGASTRODUODENOSCOPY (EGD) WITH PROPOFOL N/A 10/05/2017   Procedure: ESOPHAGOGASTRODUODENOSCOPY (EGD) WITH PROPOFOL;  Surgeon: Milus Banister, MD;  Location: [facility];  Service: Endoscopy;  Laterality: N/A;  . EUS N/A 10/05/2017   Procedure: UPPER ENDOSCOPIC ULTRASOUND (EUS) RADIAL;  Surgeon: Milus Banister, MD;  Location: [facility];  Service: Endoscopy;  Laterality: N/A;  . FOOT SURGERY Left   . LOWER EXTREMITY ANGIOGRAM Bilateral 05/27/2015   Procedure: Lower Extremity Angiogram;  Surgeon: Wellington Hampshire, MD;  Location: [facility];  Service: Cardiovascular;  Laterality: Bilateral;  . PATCH ANGIOPLASTY Right 10/10/2019   Procedure: PATCH ANGIOPLASTY USING [place];  Surgeon: Marty Heck, MD;  Location: [facility];  Service: Vascular;  Laterality: Right;  . PERIPHERAL VASCULAR ATHERECTOMY Left 05/08/2019   Procedure: PERIPHERAL VASCULAR ATHERECTOMY;  Surgeon: Wellington Hampshire, MD;  Location: [facility];  Service: Cardiovascular;  Laterality: Left;  . PERIPHERAL VASCULAR CATHETERIZATION N/A 05/27/2015   Procedure: Abdominal  Aortogram;  Surgeon: Wellington Hampshire, MD;  Location: [facility];  Service: Cardiovascular;  Laterality: N/A;  . PERIPHERAL VASCULAR CATHETERIZATION Right 05/27/2015   Procedure: Peripheral Vascular Intervention;  Surgeon: Wellington Hampshire, MD;  Location: [facility];  Service: Cardiovascular;  Laterality: Right;  SFA  . TOTAL KNEE ARTHROPLASTY Right 2011  . TOTAL KNEE ARTHROPLASTY Left 05/22/2014   Procedure: LEFT TOTAL KNEE  ARTHROPLASTY;  Surgeon: Latanya Maudlin, MD;  Location: [facility];  Service: Orthopedics;  Laterality: Left;  . TUBAL LIGATION  1980   Current Outpatient Medications on File Prior to Visit  Medication Sig Dispense Refill  . acetaminophen (TYLENOL) 500 MG tablet Take 1,000 mg by mouth every 6 (six) hours as needed for moderate pain or headache.    . ADVAIR DISKUS 250-50 MCG/DOSE AEPB INHALE 1 PUFF BY MOUTH INTO THE LUNGS EVERY 12 HOURS (Patient taking differently: Inhale 1 puff into the lungs 2 (two) times daily.) 60 each 5  . aspirin 81 MG tablet Take 81 mg by mouth daily.    . Biotin w/ Vitamins C & E (HAIR/SKIN/NAILS PO) Take 1 tablet by mouth daily.    . bisoprolol (ZEBETA) 5 MG tablet TAKE ONE-HALF TABLET BY MOUTH EVERY DAY (Patient taking differently: Take 2.5 mg by mouth at bedtime.) 90 tablet 3  . Blood Glucose Monitoring Suppl (BAYER CONTOUR MONITOR) w/Device KIT CHECK BLOOD SUGAR EVERY DAY 1 kit 0  . Blood Glucose Monitoring Suppl (ONE TOUCH ULTRA 2) w/Device KIT Check BS daily 1 kit 0  . clopidogrel (PLAVIX) 75 MG tablet Take 1 tablet (75 mg total) by mouth daily. 90 tablet 3  . diclofenac sodium (VOLTAREN) 1 % GEL Apply 2 g topically 4 (four) times daily. (Patient taking differently: Apply 2 g topically 4 (four) times daily as needed (for hand pain).) 100 g 0  . HYDROcodone-acetaminophen (NORCO/VICODIN) 5-325 MG tablet Take 1 tablet by mouth every 6 (six) hours as needed for moderate pain. Pain Management, [place] 130 tablet 0  . isosorbide mononitrate (IMDUR) 60 MG 24 hr tablet TAKE 1 TABLET BY MOUTH DAILY (Patient taking differently: Take 60 mg by mouth daily.) 90 tablet 2  . metFORMIN (GLUCOPHAGE) 1000 MG tablet TAKE 1 TABLET BY MOUTH TWICE DAILY WITH MEALS 180 tablet 0  . methocarbamol (ROBAXIN) 500 MG tablet TAKE 1 TABLET BY MOUTH EVERY 6 HOURS AS NEEDED FOR MUSCLE SPASM 120 tablet 2  . Multiple Vitamin (MULTIVITAMIN) tablet Take 1 tablet by mouth daily.    . nitroGLYCERIN  (NITROSTAT) 0.4 MG SL tablet Place 1 tablet under the tongue every 5 (five) minutes as needed for chest pain.    Marland Kitchen ondansetron (ZOFRAN) 4 MG tablet TAKE 1 TABLET(4 MG) BY MOUTH EVERY 8 HOURS AS NEEDED FOR NAUSEA OR VOMITING 90 tablet 1  . Pancrelipase, Lip-Prot-Amyl, (ZENPEP) 20000-63000 units CPEP Take 3 capsules by mouth with breakfast, with lunch, and with evening meal. 270 capsule 3  . pantoprazole (PROTONIX) 40 MG tablet TAKE 1 TABLET BY MOUTH DAILY 90 tablet 3  . pioglitazone (ACTOS) 30 MG tablet Take 1 tablet (30 mg total) by mouth daily. 90 tablet 3  . pregabalin (LYRICA) 75 MG capsule Take 1 capsule (75 mg total) by mouth 2 (two) times daily. 60 capsule 1  . Probiotic Product (TRUBIOTICS PO) Take 1 capsule by mouth daily.    . rosuvastatin (CRESTOR) 40 MG tablet TAKE 1 TABLET( 40 MG TOTAL) BY MOUTH NIGHTLY 90 tablet 3  . VENTOLIN HFA 108 (  90 Base) MCG/ACT inhaler INHALE 2 PUFFS BY MOUTH TWICE DAILY AS NEEDED FOR WHEEZING OR SHORTNESS OF BREATH (Patient taking differently: Inhale 1 puff into the lungs 2 (two) times daily as needed for wheezing or shortness of breath.) 18 g 3   No current facility-administered medications on file prior to visit.   Allergies  Allergen Reactions  . Gabapentin Other (See Comments)    Brain shakes  . Glucotrol [Glipizide] Rash and Other (See Comments)    Red rash  . Influenza Vaccines Other (See Comments)    Significant arm soreness requiring 1 year of physical therapy  . Latex Rash  . Nortriptyline Other (See Comments)    BRAIN SHAKE  . Tetanus Toxoid Hives, Swelling, Rash and Other (See Comments)    Swelling to site of injection with rash and fever  . Other Itching, Other (See Comments) and Cough    Patient is highly allergic to Cats and she begins to break out in rash and turns red if someone caring for her has cats at home.  Patient takes Claritin for this type of reaction.  Patient stated that if it goes untreated then he begins to have difficulty  breathing  . Invokana [Canagliflozin] Other (See Comments)    Yeast Infections   Social History   Socioeconomic History  . Marital status: Married    Spouse name: Not on file  . Number of children: Not on file  . Years of education: 67  . Highest education level: Not on file  Occupational History  . Not on file  Tobacco Use  . Smoking status: Former Smoker    Quit date: 12/13/2008    Years since quitting: 11.4  . Smokeless tobacco: Never Used  Vaping Use  . Vaping Use: Never used  Substance and Sexual Activity  . Alcohol use: No    Comment: Quit drinking around year 2000  . Drug use: No    Comment: Patient quit smoking around 2011  . Sexual activity: Yes    Partners: Male  Other Topics Concern  . Not on file  Social History Narrative   Denies any IV drug use or marijuana use.  Former smoker for 30 years, quit Nov. 2010. She is married with 2 children. Disabled from knee, not working.   Social Determinants of Health   Financial Resource Strain: Not on file  Food Insecurity: Not on file  Transportation Needs: Not on file  Physical Activity: Not on file  Stress: Not on file  Social Connections: Not on file  Intimate Partner Violence: Not on file   Family History  Problem Relation Age of Onset  . CVA Mother   . Emphysema Father   . Breast cancer Sister 31     Review of Systems  All other systems reviewed and are negative.      Objective:   Physical Exam Vitals reviewed.  Constitutional:      General: She is not in acute distress.    Appearance: She is well-developed. She is not diaphoretic.  HENT:     Right Ear: External ear normal.     Left Ear: External ear normal.     Nose: Nose normal.  Eyes:     Conjunctiva/sclera: Conjunctivae normal.  Neck:     Thyroid: Thyromegaly present.     Vascular: No JVD.  Cardiovascular:     Rate and Rhythm: Normal rate and regular rhythm.     Heart sounds: Normal heart sounds. No murmur heard.   Pulmonary:  Effort: Pulmonary effort is normal.     Breath sounds: Normal breath sounds. No wheezing or rales.  Chest:     Chest wall: No tenderness.  Abdominal:     General: Bowel sounds are normal. There is no distension.     Palpations: Abdomen is soft. There is no mass.     Tenderness: There is no abdominal tenderness. There is no guarding or rebound.  Musculoskeletal:     Cervical back: Neck supple.     Left foot: Tenderness and bony tenderness present.       Feet:  Lymphadenopathy:     Cervical: No cervical adenopathy.  Skin:    Findings: No erythema or rash.  Neurological:     Mental Status: She is alert and oriented to person, place, and time.     Cranial Nerves: No cranial nerve deficit.     Motor: No abnormal muscle tone.     Coordination: Coordination normal.     Deep Tendon Reflexes: Reflexes are normal and symmetric.         Assessment & Plan:  Type 2 diabetes mellitus with diabetic neuropathy, unspecified whether long term insulin use ([place]) - Plan: Vitamin B12, TSH  Left foot pain  We outlined her options today.  Options include an MRI of the foot to evaluate further for potential stress fractures, nerve conduction studies to evaluate for any neuropathy, referral to podiatry, additional medication such as either Cymbalta, amitriptyline, or Topamax.  We decided to get a second opinion with a podiatrist.  Meanwhile I will start her on amitriptyline 50 mg p.o. nightly.  Given her history of chronic pancreatitis I will also check a vitamin B12 level and a TSH to evaluate for potential causes of peripheral neuropathy.

## 2020-05-09 LAB — TSH: TSH: 2.02 mIU/L (ref 0.40–4.50)

## 2020-05-09 LAB — VITAMIN B12: Vitamin B-12: 313 pg/mL (ref 200–1100)

## 2020-05-11 ENCOUNTER — Telehealth: Payer: Self-pay | Admitting: Family Medicine

## 2020-05-11 MED ORDER — BLOOD GLUCOSE TEST VI STRP: ORAL_STRIP | 3 refills | Status: DC

## 2020-05-11 MED ORDER — BLOOD GLUCOSE SYSTEM PAK KIT: PACK | 1 refills | Status: DC

## 2020-05-11 NOTE — Telephone Encounter (Signed)
Received call from patient to request a new diabetic meter; patient's insurance no longer pays for the one she has. Free-style meter and strips are covered by her plan. Please advise at [phone]

## 2020-05-11 NOTE — Telephone Encounter (Signed)
Prescription sent to pharmacy.

## 2020-05-13 ENCOUNTER — Other Ambulatory Visit: Payer: Self-pay

## 2020-05-13 ENCOUNTER — Other Ambulatory Visit: Payer: Self-pay | Admitting: Family Medicine

## 2020-05-13 NOTE — Telephone Encounter (Signed)
Pt called needing a refill of  HYDROcodone-acetaminophen (NORCO/VICODIN) 5-325 MG tablet  Sent to [facility] on [address]  Cb#: [phone]

## 2020-05-14 MED ORDER — HYDROCODONE-ACETAMINOPHEN 5-325 MG PO TABS: 1.0000 | ORAL_TABLET | Freq: Four times a day (QID) | ORAL | 0 refills | Status: DC | PRN

## 2020-05-22 ENCOUNTER — Ambulatory Visit (INDEPENDENT_AMBULATORY_CARE_PROVIDER_SITE_OTHER): Payer: 59 | Admitting: Podiatry

## 2020-05-22 ENCOUNTER — Encounter: Payer: Self-pay | Admitting: Podiatry

## 2020-05-22 ENCOUNTER — Other Ambulatory Visit: Payer: Self-pay

## 2020-05-22 ENCOUNTER — Ambulatory Visit (INDEPENDENT_AMBULATORY_CARE_PROVIDER_SITE_OTHER): Payer: 59

## 2020-05-22 DIAGNOSIS — M7752 Other enthesopathy of left foot: Secondary | ICD-10-CM

## 2020-05-22 DIAGNOSIS — M79672 Pain in left foot: Secondary | ICD-10-CM

## 2020-05-22 DIAGNOSIS — M79671 Pain in right foot: Secondary | ICD-10-CM | POA: Diagnosis not present

## 2020-05-22 DIAGNOSIS — M722 Plantar fascial fibromatosis: Secondary | ICD-10-CM

## 2020-05-22 MED ORDER — TRIAMCINOLONE ACETONIDE 10 MG/ML IJ SUSP
20.0000 mg | Freq: Once | INTRAMUSCULAR | Status: AC
  Administered 2020-05-22: 20 mg

## 2020-05-22 NOTE — Progress Notes (Signed)
Subjective:   Patient ID: [name], female   DOB: 65 y.o.   MRN: [id number]   HPI Patient states have developed pain in my right heel and then in the top of my left foot inside of my left foot.  The left heels been doing pretty good since surgery but I do get some discomfort in the arch at times   ROS      Objective:  Physical Exam  Neurovascular status intact with exquisite discomfort right plantar fascia that was not corrected minimal discomfort left plantar fascial but quite a bit of dorsal pain in the left foot     Assessment:  Acute Planter fasciitis right dorsal and anterior tibial tendinitis left along with mild fascial arch symptoms     Plan:  H&P discussed both conditions and x-rays and today I did sterile prep and injected the fascial right 3 mg Dexasone Kenalog 5 mg Xylocaine and the dorsal tendon complex 3 mg Kenalog 5 mg Xylocaine along with heat ice therapy.  Patient will be checked back 3 weeks may need further treatment depending on response but hopefully will respond well to medication  X-rays did indicate good arch height no indications of stress fracture arthritis with spur formation noted plantar heel bilateral

## 2020-06-02 ENCOUNTER — Encounter: Payer: Self-pay | Admitting: Cardiovascular Disease

## 2020-06-02 ENCOUNTER — Ambulatory Visit (INDEPENDENT_AMBULATORY_CARE_PROVIDER_SITE_OTHER): Payer: 59 | Admitting: Cardiovascular Disease

## 2020-06-02 ENCOUNTER — Other Ambulatory Visit: Payer: Self-pay

## 2020-06-02 VITALS — BP 131/60 | HR 86 | Ht 63.0 in | Wt 159.6 lb

## 2020-06-02 DIAGNOSIS — I25118 Atherosclerotic heart disease of native coronary artery with other forms of angina pectoris: Secondary | ICD-10-CM | POA: Diagnosis not present

## 2020-06-02 DIAGNOSIS — R0609 Other forms of dyspnea: Secondary | ICD-10-CM

## 2020-06-02 DIAGNOSIS — E785 Hyperlipidemia, unspecified: Secondary | ICD-10-CM

## 2020-06-02 DIAGNOSIS — R0600 Dyspnea, unspecified: Secondary | ICD-10-CM | POA: Diagnosis not present

## 2020-06-02 DIAGNOSIS — I739 Peripheral vascular disease, unspecified: Secondary | ICD-10-CM

## 2020-06-02 NOTE — Patient Instructions (Signed)
Medication Instructions:  No changes *If you need a refill on your cardiac medications before your next appointment, please call your pharmacy*   Lab Work: None ordered If you have labs (blood work) drawn today and your tests are completely normal, you will receive your results only by: Marland Kitchen MyChart Message (if you have MyChart) OR . A paper copy in the mail If you have any lab test that is abnormal or we need to change your treatment, we will call you to review the results.   Testing/Procedures: Your physician has requested that you have a lexiscan myoview. For further information please visit [email or web address] Please follow instruction sheet, as given. This will take place at [address] [facility], [address]  How to prepare for your Myocardial Perfusion Test:  Do not eat or drink 3 hours prior to your test, except you may have water.  Do not consume products containing caffeine (regular or decaffeinated) 12 hours prior to your test. (ex: coffee, chocolate, sodas, tea).  Do bring a list of your current medications with you.  If not listed below, you may take your medications as normal.  Do wear comfortable clothes (no dresses or overalls) and walking shoes, tennis shoes preferred (No heels or open toe shoes are allowed).  Do NOT wear cologne, perfume, aftershave, or lotions (deodorant is allowed).  The test will take approximately 3 to 4 hours to complete  If these instructions are not followed, your test will have to be rescheduled.     Follow-Up: At [facility], you and your health needs are our priority.  As part of our continuing mission to provide you with exceptional heart care, we have created designated Provider Care Teams.  These Care Teams include your primary Cardiologist (physician) and Advanced Practice Providers (APPs -  Physician Assistants and Nurse Practitioners) who all work together to provide you with the care you need, when you need it.  We recommend  signing up for the patient portal called "MyChart".  Sign up information is provided on this After Visit Summary.  MyChart is used to connect with patients for Virtual Visits (Telemedicine).  Patients are able to view lab/test results, encounter notes, upcoming appointments, etc.  Non-urgent messages can be sent to your provider as well.   To learn more about what you can do with MyChart, go to [email or web address].    Your next appointment:   7 month(s) after the dopplers  The format for your next appointment:   In Person  Provider:   Lorine Bears, MD

## 2020-06-02 NOTE — Progress Notes (Signed)
Cardiology Office Note   Date:  06/02/2020   ID:  Leslie MillardRoxie Reece Bauman, DOB 1956/02/01, MRN [id number]  PCP:  Donita BrooksPickard, Warren T, MD  Cardiologist:  Jessy OtoArida  No chief complaint on file.     History of Present Illness: Mega Donney RankinsReece Galvao is a 65 y.o. female who presents for a follow up visit regarding regarding peripheral arterial disease and coronary artery disease. She has known history of peripheral arterial disease status post atherectomy of the right SFA in 2011 by Dr. Sanjuana KavaMcAlhaney. She has other chronic medical conditions including CAD, DM, HTN, recurrent idiopathic pancreatitis and hyperlipidemia. She is a former smoker.  She is s/p bilateral TKR.   She had worsening right leg claudication in April 2017. Angiography showed no significant aortoiliac disease. There was moderate right common femoral artery stenosis and severe discrete stenosis in the proximal right SFA with three-vessel runoff below the knee. There was no significant obstructive disease involving the left lower extremity. A self-expanding stent was placed to the proximal right SFA. She had previous bilateral knee replacement and chronic bilateral leg pain.   She had worsening left leg claudication last year.  Vascular studies showed mildly reduced ABI bilaterally in the 0.8 range.  Duplex showed moderate right common femoral artery stenosis and moderate SFA disease.  On the left, there was severe new stenosis in the proximal SFA. Angiography was done in March of 2021 which showed borderline stenosis affecting the left common iliac artery, severe calcified stenosis affecting the left proximal SFA and three-vessel runoff below the knee.  On the right, there was severe calcified disease affecting the common femoral artery with patent proximal SFA stent followed by 60% disease throughout the whole SFA and three-vessel runoff below the knee.  I performed successful orbital atherectomy and drug-coated balloon angioplasty to the left  SFA.   She underwent staged right common femoral artery endarterectomy by Dr. Chestine Sporelark in August.   Most recent vascular studies showed normal ABI on the left and mildly reduced on the right.  She does report right calf claudication and cramping at night.  In addition, she has a lot of numbness in both feet.  She reports worsening exertional dyspnea without chest pain.  No orthopnea or PND.   Past Medical History:  Diagnosis Date  . Asthma with COPD (HCC)    PATIENT DENIES  . CAD (coronary artery disease)    a. LHC 2011: Diffuse distal and branch vessel disease - patient managed medically, no interventional options. b. Nuc 03/2014 - low risk, no ischemia.  . Complication of anesthesia    slow to wake up with last surgery in 2011   . Diabetes mellitus with circulatory complication (HCC)   . Dyslipidemia   . Headache    hx of migraines   . Hypertension   . Osteoarthritis    severe right knee  R TKR  . PAD (peripheral artery disease) (HCC)    a. Severe stenosis mid right SFA s/p atherectomy 03/25/09. b. peripheral angiography in 12/2011 which showed only about 50% diffuse right SFA stenosis.  . Pancreatitis 2003  . Renal artery stenosis (HCC)    a. LHC 2011 - 40-50% left RAS.  . Tobacco use disorder    quit 11/10    Past Surgical History:  Procedure Laterality Date  . ABDOMINAL AORTAGRAM N/A 12/21/2011   Procedure: ABDOMINAL Ronny FlurryAORTAGRAM;  Surgeon: Iran OuchMuhammad A Ahniyah Giancola, MD;  Location: [facility] CATH LAB;  Service: Cardiovascular;  Laterality: N/A;  . ABDOMINAL AORTOGRAM W/LOWER  EXTREMITY Bilateral 05/08/2019   Procedure: ABDOMINAL AORTOGRAM W/LOWER EXTREMITY;  Surgeon: Iran Ouch, MD;  Location: [facility] INVASIVE CV LAB;  Service: Cardiovascular;  Laterality: Bilateral;  . CARDIAC CATHETERIZATION    . ENDARTERECTOMY FEMORAL Right 10/10/2019   Procedure: RIGHT FEMORAL ENDARTERECTOMY;  Surgeon: Cephus Shelling, MD;  [facility];  Service: Vascular;  Laterality: Right;  .  ESOPHAGOGASTRODUODENOSCOPY (EGD) WITH PROPOFOL N/A 10/05/2017   Procedure: ESOPHAGOGASTRODUODENOSCOPY (EGD) WITH PROPOFOL;  Surgeon: Rachael Fee, MD;  Location: [facility] ENDOSCOPY;  Service: Endoscopy;  Laterality: N/A;  . EUS N/A 10/05/2017   Procedure: UPPER ENDOSCOPIC ULTRASOUND (EUS) RADIAL;  Surgeon: Rachael Fee, MD;  Location: [facility] ENDOSCOPY;  Service: Endoscopy;  Laterality: N/A;  . FOOT SURGERY Left   . LOWER EXTREMITY ANGIOGRAM Bilateral 05/27/2015   Procedure: Lower Extremity Angiogram;  Surgeon: Iran Ouch, MD;  Location: [facility] INVASIVE CV LAB;  Service: Cardiovascular;  Laterality: Bilateral;  . PATCH ANGIOPLASTY Right 10/10/2019   Procedure: PATCH ANGIOPLASTY USING Livia Snellen BIOLOGIC PATCH;  Surgeon: Cephus Shelling, MD;  Location: [facility] OR;  Service: Vascular;  Laterality: Right;  . PERIPHERAL VASCULAR ATHERECTOMY Left 05/08/2019   Procedure: PERIPHERAL VASCULAR ATHERECTOMY;  Surgeon: Iran Ouch, MD;  Location: [facility] CV LAB;  Service: Cardiovascular;  Laterality: Left;  . PERIPHERAL VASCULAR CATHETERIZATION N/A 05/27/2015   Procedure: Abdominal Aortogram;  Surgeon: Iran Ouch, MD;  Location: [facility] CV LAB;  Service: Cardiovascular;  Laterality: N/A;  . PERIPHERAL VASCULAR CATHETERIZATION Right 05/27/2015   Procedure: Peripheral Vascular Intervention;  Surgeon: Iran Ouch, MD;  Location: [facility]VAS[facility] CV LAB;  Service: Cardiovascular;  Laterality: Right;  SFA  . TOTAL KNEE ARTHROPLASTY Right 2011  . TOTAL KNEE ARTHROPLASTY Left 05/22/2014   Procedure: LEFT TOTAL KNEE ARTHROPLASTY;  Surgeon: Ranee Gosselin, MD;  Location: [facility] ORS;  Service: Orthopedics;  Laterality: Left;  . TUBAL LIGATION  1980     Current Outpatient Medications  Medication Sig Dispense Refill  . acetaminophen (TYLENOL) 500 MG tablet Take 1,000 mg by mouth every 6 (six) hours as needed for moderate pain or headache.    . ADVAIR DISKUS 250-50 MCG/DOSE AEPB INHALE 1 PUFF BY MOUTH INTO THE  LUNGS EVERY 12 HOURS (Patient taking differently: Inhale 1 puff into the lungs 2 (two) times daily.) 60 each 5  . aspirin 81 MG tablet Take 81 mg by mouth daily.    . Biotin w/ Vitamins C & E (HAIR/SKIN/NAILS PO) Take 1 tablet by mouth daily.    . bisoprolol (ZEBETA) 5 MG tablet TAKE ONE-HALF TABLET BY MOUTH EVERY DAY (Patient taking differently: Take 2.5 mg by mouth at bedtime.) 90 tablet 3  . clopidogrel (PLAVIX) 75 MG tablet Take 1 tablet (75 mg total) by mouth daily. 90 tablet 3  . diclofenac sodium (VOLTAREN) 1 % GEL Apply 2 g topically 4 (four) times daily. (Patient taking differently: Apply 2 g topically 4 (four) times daily as needed (for hand pain).) 100 g 0  . gabapentin (NEURONTIN) 300 MG capsule Take 1 capsule by mouth 3 (three) times daily.    Marland Kitchen HYDROcodone-acetaminophen (NORCO/VICODIN) 5-325 MG tablet Take 1 tablet by mouth every 6 (six) hours as needed for moderate pain. Pain Management, [place] 130 tablet 0  . isosorbide mononitrate (IMDUR) 60 MG 24 hr tablet TAKE 1 TABLET BY MOUTH DAILY (Patient taking differently: Take 60 mg by mouth daily.) 90 tablet 2  . metFORMIN (GLUCOPHAGE) 1000 MG tablet TAKE 1 TABLET BY MOUTH TWICE DAILY WITH  MEALS 180 tablet 0  . methocarbamol (ROBAXIN) 500 MG tablet TAKE 1 TABLET BY MOUTH EVERY 6 HOURS AS NEEDED FOR MUSCLE SPASM 120 tablet 2  . Multiple Vitamin (MULTIVITAMIN) tablet Take 1 tablet by mouth daily.    . nitroGLYCERIN (NITROSTAT) 0.4 MG SL tablet Place 1 tablet under the tongue every 5 (five) minutes as needed for chest pain.    Marland Kitchen ondansetron (ZOFRAN) 4 MG tablet TAKE 1 TABLET(4 MG) BY MOUTH EVERY 8 HOURS AS NEEDED FOR NAUSEA OR VOMITING 90 tablet 1  . Pancrelipase, Lip-Prot-Amyl, (ZENPEP) 20000-63000 units CPEP Take 3 capsules by mouth with breakfast, with lunch, and with evening meal. 270 capsule 3  . pantoprazole (PROTONIX) 40 MG tablet TAKE 1 TABLET BY MOUTH DAILY 90 tablet 3  . pioglitazone (ACTOS) 30 MG tablet Take 1 tablet (30 mg  total) by mouth daily. 90 tablet 3  . Probiotic Product (TRUBIOTICS PO) Take 1 capsule by mouth daily.    . rosuvastatin (CRESTOR) 40 MG tablet TAKE 1 TABLET( 40 MG TOTAL) BY MOUTH NIGHTLY 90 tablet 3  . VENTOLIN HFA 108 (90 Base) MCG/ACT inhaler INHALE 2 PUFFS BY MOUTH TWICE DAILY AS NEEDED FOR WHEEZING OR SHORTNESS OF BREATH (Patient taking differently: Inhale 1 puff into the lungs 2 (two) times daily as needed for wheezing or shortness of breath.) 18 g 3   No current facility-administered medications for this visit.    Allergies:   Gabapentin, Glucotrol [glipizide], Influenza vaccines, Latex, Nortriptyline, Tetanus toxoid, Other, and Invokana [canagliflozin]    Social History:  The patient  reports that she quit smoking about 11 years ago. She has never used smokeless tobacco. She reports that she does not drink alcohol and does not use drugs.   Family History:  The patient's family history includes Breast cancer (age of onset: 7) in her sister; CVA in her mother; Emphysema in her father.    ROS:  Please see the history of present illness.   Otherwise, review of systems are positive for .   All other systems are reviewed and negative.    PHYSICAL EXAM: VS:  BP 131/60   Pulse 86   Ht 5\' 3"  (1.6 m)   Wt 159 lb 9.6 oz (72.4 kg)   SpO2 95%   BMI 28.27 kg/m  , BMI Body mass index is 28.27 kg/m. GEN: Well nourished, well developed, in no acute distress  HEENT: normal  Neck: no JVD, carotid bruits, or masses Cardiac: RRR; no murmurs, rubs, or gallops,no edema  Respiratory:  clear to auscultation bilaterally, normal work of breathing GI: soft, nontender, nondistended, + BS MS: no deformity or atrophy  Skin: warm and dry, no rash Neuro:  Strength and sensation are intact Psych: euthymic mood, full affect Vascular :  Femoral pulse is normal on the right side and slightly diminished on the left side.   Distal pulses are palpable on the left side and diminished on the right.  EKG:   EKG is not ordered today.     Recent Labs: 04/03/2020: ALT 14; BUN 22; Creat 1.01; Hemoglobin 12.4; Platelets 303; Potassium 4.7; Sodium 138 05/08/2020: TSH 2.02    Lipid Panel    Component Value Date/Time   CHOL 154 04/03/2020 0846   TRIG 207 (H) 04/03/2020 0846   HDL 56 04/03/2020 0846   CHOLHDL 2.8 04/03/2020 0846   VLDL 35 10/11/2019 0219   LDLCALC 71 04/03/2020 0846   LDLDIRECT 50.6 01/02/2014 1003      Wt Readings from Last  3 Encounters:  06/02/20 159 lb 9.6 oz (72.4 kg)  05/08/20 159 lb (72.1 kg)  04/06/20 157 lb 3.2 oz (71.3 kg)       ASSESSMENT AND PLAN:  1.  PAD with claudication: Status post left SFA atherectomy and drug-coated balloon angioplasty as well as right common femoral artery endarterectomy.  She continues to have residual right calf claudication likely due to right SFA disease.  Symptoms are overall stable.  Repeat vascular studies in 6 months from now.  2. Coronary artery disease involving native coronary arteries with other forms of angina: She reports worsening exertional dyspnea which could be angina equivalent.  Previous cardiac catheterization in 2011 showed distal LAD and small branch disease which is being managed medically.  I recommend evaluation with a Lexiscan Myoview.  She is not able to exercise on a treadmill due to right calf claudication.  3. Hyperlipidemia:  Continue treatment with high-dose rosuvastatin.  I reviewed most recent lipid profile which showed an LDL of 54.  Triglyceride was mildly elevated at 164.  4. Diabetes mellitus: Managed by primary care physician.    Disposition:   FU with me in 6 months  Signed,  Lorine Bears, MD  06/02/2020 9:45 AM    [facility]

## 2020-06-04 ENCOUNTER — Telehealth: Payer: Self-pay | Admitting: Family Medicine

## 2020-06-04 MED ORDER — BISOPROLOL FUMARATE 5 MG PO TABS: 2.5000 mg | ORAL_TABLET | Freq: Every day | ORAL | 3 refills | Status: DC

## 2020-06-04 NOTE — Telephone Encounter (Signed)
Patient called to request refill of   bisoprolol (ZEBETA) 5 MG tablet [[id number]]   And to follow up on request she's been making to update preferred pharmacy.   Pharmacy:  [facility][id number] [place], [place] - [address] AT [facility] OF [address] & [address] [place] [address]  Phone:  [phone] Fax:  [phone]  DEA #:  [id number]  Please advise patient when Rx called in at [phone]

## 2020-06-04 NOTE — Telephone Encounter (Signed)
Prescription sent to pharmacy.   Pharmacy changed.

## 2020-06-05 ENCOUNTER — Other Ambulatory Visit: Payer: Self-pay | Admitting: Family Medicine

## 2020-06-10 ENCOUNTER — Other Ambulatory Visit: Payer: Self-pay | Admitting: Family Medicine

## 2020-06-10 ENCOUNTER — Inpatient Hospital Stay (HOSPITAL_COMMUNITY): Admission: RE | Admit: 2020-06-10 | Payer: 59 | Source: Ambulatory Visit

## 2020-06-10 NOTE — Telephone Encounter (Signed)
Ok to refill??  Last office visit 05/08/2020.  Last refill 05/14/2020.

## 2020-06-10 NOTE — Telephone Encounter (Signed)
Patient called to request refill of HYDROcodone-acetaminophen (NORCO/VICODIN) 5-325 MG tablet [[id number]]   [facility] #[id number] [place], [place] - [address] AT [facility] OF [address]  [address] [place] [address]  Phone:  [phone] Fax:  [phone]  DEA #:  [id number]  Patient has a couple of pills left.  Please advise when refill called in at [phone].

## 2020-06-11 MED ORDER — HYDROCODONE-ACETAMINOPHEN 5-325 MG PO TABS: 1.0000 | ORAL_TABLET | Freq: Four times a day (QID) | ORAL | 0 refills | Status: DC | PRN

## 2020-06-12 ENCOUNTER — Ambulatory Visit (INDEPENDENT_AMBULATORY_CARE_PROVIDER_SITE_OTHER): Payer: 59 | Admitting: Podiatry

## 2020-06-12 ENCOUNTER — Encounter: Payer: Self-pay | Admitting: Podiatry

## 2020-06-12 ENCOUNTER — Other Ambulatory Visit: Payer: Self-pay

## 2020-06-12 DIAGNOSIS — M778 Other enthesopathies, not elsewhere classified: Secondary | ICD-10-CM | POA: Diagnosis not present

## 2020-06-12 MED ORDER — PREDNISONE 10 MG PO TABS: ORAL_TABLET | ORAL | 0 refills | Status: DC

## 2020-06-12 MED ORDER — TRIAMCINOLONE ACETONIDE 10 MG/ML IJ SUSP: 10.0000 mg | Freq: Once | INTRAMUSCULAR | Status: DC

## 2020-06-12 NOTE — Progress Notes (Signed)
Subjective:   Patient ID: [name], female   DOB: 66 y.o.   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

## 2020-06-16 ENCOUNTER — Telehealth: Payer: Self-pay | Admitting: *Deleted

## 2020-06-16 ENCOUNTER — Telehealth: Payer: Self-pay | Admitting: Podiatry

## 2020-06-16 MED ORDER — PANCRELIPASE (LIP-PROT-AMYL) 36000-114000 UNITS PO CPEP: ORAL_CAPSULE | ORAL | 11 refills | Status: DC

## 2020-06-16 NOTE — Telephone Encounter (Signed)
Received call from patient.   Reports that she has been granted prescription assistance for Creon.   Medication list updated.   PCP to be made aware.

## 2020-06-16 NOTE — Telephone Encounter (Signed)
Patient called our office today stating Dr.regal prescribed her Prednisone which is causing headaches and her blood sugar to go up and she would like to know if there is anything else that can be  prescribed to her.

## 2020-06-17 NOTE — Telephone Encounter (Signed)
Not at this time. She can take tylenol or alleve

## 2020-06-18 ENCOUNTER — Telehealth (HOSPITAL_COMMUNITY): Payer: Self-pay | Admitting: *Deleted

## 2020-06-18 NOTE — Telephone Encounter (Signed)
Close encounter 

## 2020-06-19 ENCOUNTER — Ambulatory Visit (HOSPITAL_COMMUNITY)
Admission: RE | Admit: 2020-06-19 | Discharge: 2020-06-19 | Disposition: A | Discharge status: Home / Self Care | Payer: 59 | Source: Ambulatory Visit | Attending: Cardiology | Admitting: Cardiology

## 2020-06-19 ENCOUNTER — Other Ambulatory Visit: Payer: Self-pay

## 2020-06-19 DIAGNOSIS — R0600 Dyspnea, unspecified: Secondary | ICD-10-CM | POA: Diagnosis not present

## 2020-06-19 DIAGNOSIS — R0609 Other forms of dyspnea: Secondary | ICD-10-CM

## 2020-06-19 LAB — MYOCARDIAL PERFUSION IMAGING
LV dias vol: 61 mL (ref 46–106)
LV sys vol: 20 mL
Peak HR: 95 {beats}/min
Rest HR: 70 {beats}/min
SDS: 3
SRS: 7
SSS: 10
TID: 0.96

## 2020-06-19 MED ORDER — AMINOPHYLLINE 25 MG/ML IV SOLN
75.0000 mg | Freq: Once | INTRAVENOUS | Status: AC
  Administered 2020-06-19: 75 mg via INTRAVENOUS

## 2020-06-19 MED ORDER — TECHNETIUM TC 99M TETROFOSMIN IV KIT
9.4000 | PACK | Freq: Once | INTRAVENOUS | Status: AC | PRN
  Administered 2020-06-19: 9.4 via INTRAVENOUS
  Filled 2020-06-19: qty 10

## 2020-06-19 MED ORDER — TECHNETIUM TC 99M TETROFOSMIN IV KIT
28.2000 | PACK | Freq: Once | INTRAVENOUS | Status: AC | PRN
  Administered 2020-06-19: 28.2 via INTRAVENOUS
  Filled 2020-06-19: qty 29

## 2020-06-19 MED ORDER — REGADENOSON 0.4 MG/5ML IV SOLN
0.4000 mg | Freq: Once | INTRAVENOUS | Status: AC
  Administered 2020-06-19: 0.4 mg via INTRAVENOUS

## 2020-06-26 ENCOUNTER — Telehealth: Payer: Self-pay | Admitting: Family Medicine

## 2020-06-26 MED ORDER — ISOSORBIDE MONONITRATE ER 60 MG PO TB24: 60.0000 mg | ORAL_TABLET | Freq: Every day | ORAL | 2 refills | Status: DC

## 2020-06-26 NOTE — Telephone Encounter (Signed)
Prescription sent to pharmacy.

## 2020-06-26 NOTE — Telephone Encounter (Signed)
Pt called in stating that she needed a refill of  isosorbide mononitrate (IMDUR) 60 MG 24 hr  Sent to pharmacy. Please call. Cb#: [phone]

## 2020-06-30 ENCOUNTER — Other Ambulatory Visit: Payer: Self-pay

## 2020-06-30 ENCOUNTER — Ambulatory Visit (INDEPENDENT_AMBULATORY_CARE_PROVIDER_SITE_OTHER): Payer: 59 | Admitting: Cardiovascular Disease

## 2020-06-30 ENCOUNTER — Encounter: Payer: Self-pay | Admitting: Cardiovascular Disease

## 2020-06-30 VITALS — BP 118/66 | HR 80 | Ht 62.5 in | Wt 163.2 lb

## 2020-06-30 DIAGNOSIS — I25118 Atherosclerotic heart disease of native coronary artery with other forms of angina pectoris: Secondary | ICD-10-CM | POA: Diagnosis not present

## 2020-06-30 DIAGNOSIS — I739 Peripheral vascular disease, unspecified: Secondary | ICD-10-CM

## 2020-06-30 DIAGNOSIS — E785 Hyperlipidemia, unspecified: Secondary | ICD-10-CM | POA: Diagnosis not present

## 2020-06-30 NOTE — Progress Notes (Signed)
Cardiology Office Note   Date:  06/30/2020   ID:  [name], DOB 02/09/1956, MRN [id number]  PCP:  Donita Brooks, MD  Cardiologist:  Jessy Oto chief complaint on file.     History of Present Illness: [name] is a 64 y.o. female who presents for a follow up visit regarding regarding peripheral arterial disease and coronary artery disease. She has known history of peripheral arterial disease status post atherectomy of the right SFA in 2011 by Dr. Sanjuana Kava. She has other chronic medical conditions including CAD, DM, HTN, recurrent idiopathic pancreatitis and hyperlipidemia. She is a former smoker.  She is s/p bilateral TKR.   She had worsening right leg claudication in April 2017. Angiography showed no significant aortoiliac disease. There was moderate right common femoral artery stenosis and severe discrete stenosis in the proximal right SFA with three-vessel runoff below the knee. There was no significant obstructive disease involving the left lower extremity. A self-expanding stent was placed to the proximal right SFA. She had previous bilateral knee replacement and chronic bilateral leg pain.   She had worsening left leg claudication in 2021.  Vascular studies showed mildly reduced ABI bilaterally in the 0.8 range.  Duplex showed moderate right common femoral artery stenosis and moderate SFA disease.  On the left, there was severe new stenosis in the proximal SFA. Angiography was done in March of 2021 which showed borderline stenosis affecting the left common iliac artery, severe calcified stenosis affecting the left proximal SFA and three-vessel runoff below the knee.  On the right, there was severe calcified disease affecting the common femoral artery with patent proximal SFA stent followed by 60% disease throughout the whole SFA and three-vessel runoff below the knee.  I performed successful orbital atherectomy and drug-coated balloon angioplasty to the left  SFA.   She underwent staged right common femoral artery endarterectomy by Dr. Chestine Spore in August of 2021.  She had residual right calf claudication and during last visit she reported worsening exertional dyspnea. Lexiscan Myoview was mildly abnormal with evidence of anterior wall ischemia. She reports that she gets substernal chest tightness but not very frequently.  She does get tired easier than the usual but reports stability in symptoms over the last 6 months.  She continues to have right calf claudication with exertion.   Past Medical History:  Diagnosis Date  . Asthma with COPD (HCC)    PATIENT DENIES  . CAD (coronary artery disease)    a. LHC 2011: Diffuse distal and branch vessel disease - patient managed medically, no interventional options. b. Nuc 03/2014 - low risk, no ischemia.  . Complication of anesthesia    slow to wake up with last surgery in 2011   . Diabetes mellitus with circulatory complication (HCC)   . Dyslipidemia   . Headache    hx of migraines   . Hypertension   . Osteoarthritis    severe right knee  R TKR  . PAD (peripheral artery disease) (HCC)    a. Severe stenosis mid right SFA s/p atherectomy 03/25/09. b. peripheral angiography in 12/2011 which showed only about 50% diffuse right SFA stenosis.  . Pancreatitis 2003  . Renal artery stenosis (HCC)    a. LHC 2011 - 40-50% left RAS.  . Tobacco use disorder    quit 11/10    Past Surgical History:  Procedure Laterality Date  . ABDOMINAL AORTAGRAM N/A 12/21/2011   Procedure: ABDOMINAL Ronny Flurry;  Surgeon: Iran Ouch, MD;  Location: [facility]ATH [facility];  Service: Cardiovascular;  Laterality: N/A;  . ABDOMINAL AORTOGRAM W/LOWER EXTREMITY Bilateral 05/08/2019   Procedure: ABDOMINAL AORTOGRAM W/LOWER EXTREMITY;  Surgeon: Iran Ouch, MD;  Location: [facility] INVASIVE CV [facility];  Service: Cardiovascular;  Laterality: Bilateral;  . CARDIAC CATHETERIZATION    . ENDARTERECTOMY FEMORAL Right 10/10/2019   Procedure: RIGHT  FEMORAL ENDARTERECTOMY;  Surgeon: Cephus Shelling, MD;  Location: [facility] OR;  Service: Vascular;  Laterality: Right;  . ESOPHAGOGASTRODUODENOSCOPY (EGD) WITH PROPOFOL N/A 10/05/2017   Procedure: ESOPHAGOGASTRODUODENOSCOPY (EGD) WITH PROPOFOL;  Surgeon: Rachael Fee, MD;  Location: [facility] ENDOSCOPY;  Service: Endoscopy;  Laterality: N/A;  . EUS N/A 10/05/2017   Procedure: UPPER ENDOSCOPIC ULTRASOUND (EUS) RADIAL;  Surgeon: Rachael Fee, MD;  Location: [facility] ENDOS[facility];  Service: Endoscopy;  Laterality: N/A;  . FOOT SURGERY Left   . LOWER EXTREMITY ANGIOGRAM Bilateral 05/27/2015   Procedure: Lower Extremity Angiogram;  Surgeon: Iran Ouch, MD;  Location: [facility];  Service: Cardiovascular;  Laterality: Bilateral;  . PATCH ANGIOPLASTY Right 10/10/2019   Procedure: PATCH ANGIOPLASTY USING Livia Snellen BIOLOGIC PATCH;  Surgeon: Cephus Shelling, MD;  Location: [facility] OR;  Service: Vascular;  Laterality: Right;  . PERIPHERAL VASCULAR ATHERECTOMY Left 05/08/2019   Procedure: PERIPHERAL VASCULAR ATHERECTOMY;  Surgeon: Iran Ouch, MD;  Location: [facility];  Service: Cardiovascular;  Laterality: Left;  . PERIPHERAL VASCULAR CATHETERIZATION N/A 05/27/2015   Procedure: Abdominal Aortogram;  Surgeon: Iran Ouch, MD;  Location: [facility];  Service: Cardiovascular;  Laterality: N/A;  . PERIPHERAL VASCULAR CATHETERIZATION Right 05/27/2015   Procedure: Peripheral Vascular Intervention;  Surgeon: Iran Ouch, MD;  Location: [facility];  Service: Cardiovascular;  Laterality: Right;  SFA  . TOTAL KNEE ARTHROPLASTY Right 2011  . TOTAL KNEE ARTHROPLASTY Left 05/22/2014   Procedure: LEFT TOTAL KNEE ARTHROPLASTY;  Surgeon: Ranee Gosselin, MD;  Location: [facility] ORS;  Service: Orthopedics;  Laterality: Left;  . TUBAL LIGATION  1980     Current Outpatient Medications  Medication Sig Dispense Refill  . acetaminophen (TYLENOL) 500 MG tablet Take 1,000 mg by mouth every 6 (six)  hours as needed for moderate pain or headache.    . ADVAIR DISKUS 250-50 MCG/DOSE AEPB INHALE 1 PUFF BY MOUTH INTO THE LUNGS EVERY 12 HOURS (Patient taking differently: Inhale 1 puff into the lungs 2 (two) times daily.) 60 each 5  . amitriptyline (ELAVIL) 50 MG tablet TAKE 1 TABLET(50 MG) BY MOUTH AT BEDTIME 30 tablet 0  . aspirin 81 MG tablet Take 81 mg by mouth daily.    . Biotin w/ Vitamins C & E (HAIR/SKIN/NAILS PO) Take 1 tablet by mouth daily.    . bisoprolol (ZEBETA) 5 MG tablet Take 0.5 tablets (2.5 mg total) by mouth daily. 45 tablet 3  . clopidogrel (PLAVIX) 75 MG tablet Take 1 tablet (75 mg total) by mouth daily. 90 tablet 3  . diclofenac sodium (VOLTAREN) 1 % GEL Apply 2 g topically 4 (four) times daily. (Patient taking differently: Apply 2 g topically 4 (four) times daily as needed (for hand pain).) 100 g 0  . gabapentin (NEURONTIN) 300 MG capsule Take 1 capsule by mouth 3 (three) times daily.    Marland Kitchen HYDROcodone-acetaminophen (NORCO/VICODIN) 5-325 MG tablet Take 1 tablet by mouth every 6 (six) hours as needed for moderate pain. Chronic Pain. Dx: G89.4 130 tablet 0  . isosorbide mononitrate (IMDUR) 60 MG 24 hr tablet Take 1 tablet (60 mg total) by  mouth daily. 90 tablet 2  . lipase/protease/amylase (CREON) 36000 UNITS CPEP capsule Take 2 capsules (72,000 Units total) by mouth 3 (three) times daily with meals. May also take 1 capsule (36,000 Units total) as needed (with snacks). 240 capsule 11  . metFORMIN (GLUCOPHAGE) 1000 MG tablet TAKE 1 TABLET BY MOUTH TWICE DAILY WITH MEALS 180 tablet 0  . methocarbamol (ROBAXIN) 500 MG tablet TAKE 1 TABLET BY MOUTH EVERY 6 HOURS AS NEEDED FOR MUSCLE SPASM 120 tablet 2  . Multiple Vitamin (MULTIVITAMIN) tablet Take 1 tablet by mouth daily.    . nitroGLYCERIN (NITROSTAT) 0.4 MG SL tablet Place 1 tablet under the tongue every 5 (five) minutes as needed for chest pain.    Marland Kitchen ondansetron (ZOFRAN) 4 MG tablet TAKE 1 TABLET(4 MG) BY MOUTH EVERY 8 HOURS AS  NEEDED FOR NAUSEA OR VOMITING 90 tablet 1  . pantoprazole (PROTONIX) 40 MG tablet TAKE 1 TABLET BY MOUTH DAILY 90 tablet 3  . pioglitazone (ACTOS) 30 MG tablet Take 1 tablet (30 mg total) by mouth daily. 90 tablet 3  . Probiotic Product (TRUBIOTICS PO) Take 1 capsule by mouth daily.    . rosuvastatin (CRESTOR) 40 MG tablet TAKE 1 TABLET( 40 MG TOTAL) BY MOUTH NIGHTLY 90 tablet 3  . VENTOLIN HFA 108 (90 Base) MCG/ACT inhaler INHALE 2 PUFFS BY MOUTH TWICE DAILY AS NEEDED FOR WHEEZING OR SHORTNESS OF BREATH (Patient taking differently: Inhale 1 puff into the lungs 2 (two) times daily as needed for wheezing or shortness of breath.) 18 g 3  . vitamin B-12 (CYANOCOBALAMIN) 1000 MCG tablet Take 1,000 mcg by mouth daily.     Current Facility-Administered Medications  Medication Dose Route Frequency Provider Last Rate Last Admin  . triamcinolone acetonide (KENALOG) 10 MG/ML injection 10 mg  10 mg Other Once Regal, Norman S, DPM        Allergies:   Gabapentin, Glucotrol [glipizide], Influenza vaccines, Latex, Nortriptyline, Tetanus toxoid, Other, and Invokana [canagliflozin]    Social History:  The patient  reports that she quit smoking about 11 years ago. She has never used smokeless tobacco. She reports that she does not drink alcohol and does not use drugs.   Family History:  The patient's family history includes Breast cancer (age of onset: 33) in her sister; CVA in her mother; Emphysema in her father.    ROS:  Please see the history of present illness.   Otherwise, review of systems are positive for .   All other systems are reviewed and negative.    PHYSICAL EXAM: VS:  BP 118/66   Pulse 80   Ht 5' 2.5" (1.588 m)   Wt 163 lb 3.2 oz (74 kg)   SpO2 97%   BMI 29.37 kg/m  , BMI Body mass index is 29.37 kg/m. GEN: Well nourished, well developed, in no acute distress  HEENT: normal  Neck: no JVD, carotid bruits, or masses Cardiac: RRR; no murmurs, rubs, or gallops,no edema  Respiratory:   clear to auscultation bilaterally, normal work of breathing GI: soft, nontender, nondistended, + BS MS: no deformity or atrophy  Skin: warm and dry, no rash Neuro:  Strength and sensation are intact Psych: euthymic mood, full affect Vascular :  Femoral pulse is normal on the right side and slightly diminished on the left side.   Distal pulses are palpable on the left side and diminished on the right.  EKG:  EKG is ordered today. EKG showed normal sinus rhythm with no significant ST or  T wave changes.    Recent Labs: 04/03/2020: ALT 14; BUN 22; Creat 1.01; Hemoglobin 12.4; Platelets 303; Potassium 4.7; Sodium 138 05/08/2020: TSH 2.02    Lipid Panel    Component Value Date/Time   CHOL 154 04/03/2020 0846   TRIG 207 (H) 04/03/2020 0846   HDL 56 04/03/2020 0846   CHOLHDL 2.8 04/03/2020 0846   VLDL 35 10/11/2019 0219   LDLCALC 71 04/03/2020 0846   LDLDIRECT 50.6 01/02/2014 1003      Wt Readings from Last 3 Encounters:  06/30/20 163 lb 3.2 oz (74 kg)  06/19/20 159 lb (72.1 kg)  06/02/20 159 lb 9.6 oz (72.4 kg)       ASSESSMENT AND PLAN:  1.  PAD with claudication: Status post left SFA atherectomy and drug-coated balloon angioplasty as well as right common femoral artery endarterectomy.  She continues to have residual right calf claudication likely due to right SFA disease.  Symptoms are overall stable.  Repeat vascular studies in October of this year. She does have palpable pulses distally although somewhat diminished on the right side compared to the left.  2. Coronary artery disease involving native coronary arteries with other forms of angina:   Previous cardiac catheterization in 2011 showed distal LAD and small branch disease which is being managed medically.  Recent [employer] Myoview showed evidence of mild anterior wall ischemia.  This is somewhat of an expected finding.  Given stability of her symptoms, I favor continued medical therapy for now and if there is worsening, we  will plan on proceeding with cardiac catheterization.   3. Hyperlipidemia:  Continue treatment with high-dose rosuvastatin.  I reviewed most recent lipid profile which showed an LDL of 54.  Triglyceride was mildly elevated at 164.  4. Diabetes mellitus: Managed by primary care physician.    Disposition:   FU with me in 3 months  Signed,  Lorine BearsMuhammad Michelle Vanhise, MD  06/30/2020 2:04 PM    [facility]

## 2020-06-30 NOTE — Patient Instructions (Signed)
Medication Instructions:  No changes *If you need a refill on your cardiac medications before your next appointment, please call your pharmacy*   Lab Work: None ordered If you have labs (blood work) drawn today and your tests are completely normal, you will receive your results only by: MyChart Message (if you have MyChart) OR A paper copy in the mail If you have any lab test that is abnormal or we need to change your treatment, we will call you to review the results.   Testing/Procedures: None ordered   Follow-Up: At [facility], you and your health needs are our priority.  As part of our continuing mission to provide you with exceptional heart care, we have created designated Provider Care Teams.  These Care Teams include your primary Cardiologist (physician) and Advanced Practice Providers (APPs -  Physician Assistants and Nurse Practitioners) who all work together to provide you with the care you need, when you need it.  We recommend signing up for the patient portal called "MyChart".  Sign up information is provided on this After Visit Summary.  MyChart is used to connect with patients for Virtual Visits (Telemedicine).  Patients are able to view lab/test results, encounter notes, upcoming appointments, etc.  Non-urgent messages can be sent to your provider as well.   To learn more about what you can do with MyChart, go to [email or web address].    Your next appointment:   3 month(s)  The format for your next appointment:   In Person  Provider:   Muhammad Arida, MD   

## 2020-07-03 ENCOUNTER — Other Ambulatory Visit: Payer: Self-pay | Admitting: Family Medicine

## 2020-07-14 ENCOUNTER — Other Ambulatory Visit: Payer: Self-pay | Admitting: Family Medicine

## 2020-07-14 MED ORDER — HYDROCODONE-ACETAMINOPHEN 5-325 MG PO TABS: 1.0000 | ORAL_TABLET | Freq: Four times a day (QID) | ORAL | 0 refills | Status: DC | PRN

## 2020-07-14 NOTE — Telephone Encounter (Signed)
Ok to refill??  Last office visit 05/08/2020.  Last refill 06/11/2020.

## 2020-07-14 NOTE — Telephone Encounter (Signed)
Patient called request refill of HYDROcodone-acetaminophen (NORCO/VICODIN) 5-325 MG tablet [[id number]]    Pharmacy confirmed as  [id number] [place], [place] - [address] AT [facility] OF [address]  [address] [place] [address]  Phone:  [phone] Fax:  [phone]  DEA #:  [id number]  Please advise patient when Rx called in at [phone].

## 2020-07-15 ENCOUNTER — Other Ambulatory Visit: Payer: Self-pay | Admitting: Family Medicine

## 2020-07-18 ENCOUNTER — Other Ambulatory Visit: Payer: Self-pay | Admitting: Family Medicine

## 2020-08-03 ENCOUNTER — Telehealth: Payer: Self-pay | Admitting: Family Medicine

## 2020-08-03 ENCOUNTER — Other Ambulatory Visit: Payer: Self-pay

## 2020-08-03 MED ORDER — ALBUTEROL SULFATE HFA 108 (90 BASE) MCG/ACT IN AERS: 2.0000 | INHALATION_SPRAY | Freq: Four times a day (QID) | RESPIRATORY_TRACT | 3 refills | Status: DC | PRN

## 2020-08-03 MED ORDER — FLUTICASONE-SALMETEROL 250-50 MCG/ACT IN AEPB: 1.0000 | INHALATION_SPRAY | Freq: Two times a day (BID) | RESPIRATORY_TRACT | 11 refills | Status: DC

## 2020-08-03 NOTE — Telephone Encounter (Signed)
Patient called to follow up on rx refill requests for  albuterol   ADVAIR DISKUS 250-50 MCG/DOSE AEPB [[id number]]   Prescriptions need to be resent in Pickard's name.   Please advise at [phone]

## 2020-08-03 NOTE — Telephone Encounter (Signed)
Prescription sent to pharmacy.

## 2020-08-11 ENCOUNTER — Telehealth: Payer: Self-pay | Admitting: *Deleted

## 2020-08-11 NOTE — Telephone Encounter (Signed)
Patient due for Cologuard re-screen.   Order placed via [employer].   Cologuard (Order [id number])

## 2020-08-18 ENCOUNTER — Other Ambulatory Visit: Payer: Self-pay | Admitting: Family Medicine

## 2020-08-18 MED ORDER — HYDROCODONE-ACETAMINOPHEN 5-325 MG PO TABS: 1.0000 | ORAL_TABLET | Freq: Four times a day (QID) | ORAL | 0 refills | Status: DC | PRN

## 2020-08-18 NOTE — Telephone Encounter (Signed)
Patient requesting refill on hydrocodone called into [facility] on [address]  CB# [phone]

## 2020-09-08 ENCOUNTER — Encounter: Payer: Self-pay | Admitting: *Deleted

## 2020-09-09 ENCOUNTER — Ambulatory Visit: Payer: 59 | Admitting: Gastroenterology

## 2020-09-09 ENCOUNTER — Encounter: Payer: Self-pay | Admitting: Gastroenterology

## 2020-09-09 VITALS — BP 118/58 | HR 92 | Ht 62.5 in | Wt 169.0 lb

## 2020-09-09 DIAGNOSIS — K860 Alcohol-induced chronic pancreatitis: Secondary | ICD-10-CM | POA: Diagnosis not present

## 2020-09-09 DIAGNOSIS — R1010 Upper abdominal pain, unspecified: Secondary | ICD-10-CM

## 2020-09-09 DIAGNOSIS — R12 Heartburn: Secondary | ICD-10-CM | POA: Diagnosis not present

## 2020-09-09 MED ORDER — PANTOPRAZOLE SODIUM 40 MG PO TBEC: 40.0000 mg | DELAYED_RELEASE_TABLET | Freq: Two times a day (BID) | ORAL | 0 refills | Status: DC

## 2020-09-09 NOTE — Patient Instructions (Signed)
If you are age 63 or older, your body mass index should be between 23-30. Your Body mass index is 30.42 kg/m. If this is out of the aforementioned range listed, please consider follow up with your Primary Care Provider.  If you are age 50 or younger, your body mass index should be between 19-25. Your Body mass index is 30.42 kg/m. If this is out of the aformentioned range listed, please consider follow up with your Primary Care Provider.   __________________________________________________________  The [facility] providers would like to encourage you to use [facility] to communicate with providers for non-urgent requests or questions.  Due to long hold times on the telephone, sending your provider a message by [facility] may be a faster and more efficient way to get a response.  Please allow 48 business hours for a response.  Please remember that this is for non-urgent requests.   Please start the Protonix twice a day as directed.   It was a pleasure to see you today!  Thank you for trusting me with your gastrointestinal care!

## 2020-09-09 NOTE — Progress Notes (Signed)
[facility] Progress Note  Chief Complaint: Chronic pancreatitis  Subjective  History: [name] follows up with me for the first time since January 2021.  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  Her last hospitalization in our health system was in December 2019, follow-up from that summarized in my last office note.  When I last saw her, she was taking pancreatic enzyme supplements, and was on chronic opioids for the pancreatitis and arthritis, managed by primary care. Aveya tells me that late last year she was having more episodes of pancreatitis with abdominal pain nausea and vomiting that would keep her in bed for 2 to 3 days at a time.  There were no clear triggers for this, and she has remained abstinent from alcohol for many years.  She lost a lot of weight with this, but has slowly put it back on as the symptoms improved.  She takes Zofran most days for nausea. She has longstanding reflux manifest primarily as heartburn, less often regurgitation, no dysphagia.  Symptoms occur daytime and night, and she has been on pantoprazole 40 mg daily for years.  She was typically taking in the morning, but a few months back changed to evening meal. She had tried taking some as needed Maalox or Pepto-Bismol but it seemed to make her constipated.  ROS: Cardiovascular:  no chest pain Respiratory: no dyspnea Arthralgias Remainder of systems negative except as above  The patient's Past Medical, Family and Social History were reviewed and are on file in the EMR. Past Medical History:  Diagnosis Date   Aortic atherosclerosis (HCC)    Asthma with COPD (HCC)    PATIENT DENIES   CAD (coronary artery disease)    a. LHC 2011: Diffuse distal and branch vessel disease - patient managed medically, no interventional options. b. Nuc 03/2014 - low risk, no ischemia.   Complication of anesthesia    slow to wake up with last surgery in 2011     Diabetes mellitus with circulatory complication (HCC)    Diverticulosis    Dyslipidemia    Headache    hx of migraines    Hypertension    Osteoarthritis    severe right knee  R TKR   PAD (peripheral artery disease) (HCC)    a. Severe stenosis mid right SFA s/p atherectomy 03/25/09. b. peripheral angiography in 12/2011 which showed only about 50% diffuse right SFA stenosis.   Pancreatitis 2003   Renal artery stenosis (HCC)    a. LHC 2011 - 40-50% left RAS.   Tobacco use disorder    quit 11/10   Past Surgical History:  Procedure Laterality Date   ABDOMINAL AORTAGRAM N/A 12/21/2011   Procedure: ABDOMINAL AORTAGRAM;  Surgeon: Iran OuchMuhammad A Arida, MD;  Location: [facility]ATH [facility];  Service: Cardiovascular;  Laterality: N/A;   ABDOMINAL AORTOGRAM W/LOWER EXTREMITY Bilateral 05/08/2019   Procedure: ABDOMINAL AORTOGRAM W/LOWER EXTREMITY;  Surgeon: Iran OuchArida, Muhammad A, MD;  Location: [facility]AS[facility];  Service: Cardiovascular;  Laterality: Bilateral;   CARDIAC CATHETERIZATION     ENDARTERECTOMY FEMORAL Right 10/10/2019   Procedure: RIGHT FEMORAL ENDARTERECTOMY;  Surgeon: Cephus Shellinglark, Christopher J, MD;  [facility];  Service: Vascular;  Laterality: Right;   ESOPHAGOGASTRODUODENOSCOPY (EGD) WITH PROPOFOL N/A 10/05/2017   Procedure: ESOPHAGOGASTRODUODENOSCOPY (EGD) WITH PROPOFOL;  Surgeon: Rachael FeeJacobs, Daniel P, MD;  Location: [facility] ENDOSCOPY;  Service: Endoscopy;  Laterality: N/A;   EUS N/A 10/05/2017   Procedure: UPPER ENDOSCOPIC  ULTRASOUND (EUS) RADIAL;  Surgeon: Rachael Fee, MD;  Location: [facility] ENDOSCOPY;  Service: Endoscopy;  Laterality: N/A;   FOOT SURGERY Left    LOWER EXTREMITY ANGIOGRAM Bilateral 05/27/2015   Procedure: Lower Extremity Angiogram;  Surgeon: Iran Ouch, MD;  [facility]: [facility];  Service: Cardiovascular;  Laterality: Bilateral;   PATCH ANGIOPLASTY Right 10/10/2019   Procedure: PATCH ANGIOPLASTY USING Livia Snellen BIOLOGIC PATCH;  Surgeon: Cephus Shelling, MD;  Location: [facility]  OR;  Service: Vascular;  Laterality: Right;   PERIPHERAL VASCULAR ATHERECTOMY Left 05/08/2019   Procedure: PERIPHERAL VASCULAR ATHERECTOMY;  Surgeon: Iran Ouch, MD;  [facility];  Service: Cardiovascular;  Laterality: Left;   PERIPHERAL VASCULAR CATHETERIZATION N/A 05/27/2015   Procedure: Abdominal Aortogram;  Surgeon: Iran Ouch, MD;  [facility]: [facility];  Service: Cardiovascular;  Laterality: N/A;   PERIPHERAL VASCULAR CATHETERIZATION Right 05/27/2015   Procedure: Peripheral Vascular Intervention;  Surgeon: Iran Ouch, MD;  [facility]: [facility];  Service: Cardiovascular;  Laterality: Right;  SFA   TOTAL KNEE ARTHROPLASTY Right 2011   TOTAL KNEE ARTHROPLASTY Left 05/22/2014   Procedure: LEFT TOTAL KNEE ARTHROPLASTY;  Surgeon: Ranee Gosselin, MD;  Location: [facility] ORS;  Service: Orthopedics;  Laterality: Left;   TUBAL LIGATION  1980   Since I last saw her, she has had ongoing trouble with peripheral arterial disease including left lower extremity angioplasty and right lower extremity stent placement (March 2021) and long-term DAPT  Objective:  Med list reviewed  Current Outpatient Medications:    acetaminophen (TYLENOL) 500 MG tablet, Take 1,000 mg by mouth every 6 (six) hours as needed for moderate pain or headache., Disp: , Rfl:    albuterol (VENTOLIN HFA) 108 (90 Base) MCG/ACT inhaler, Inhale 2 puffs into the lungs every 6 (six) hours as needed for wheezing or shortness of breath., Disp: 18 g, Rfl: 3   amitriptyline (ELAVIL) 50 MG tablet, TAKE 1 TABLET(50 MG) BY MOUTH AT BEDTIME, Disp: 30 tablet, Rfl: 6   aspirin 81 MG tablet, Take 81 mg by mouth daily., Disp: , Rfl:    Biotin w/ Vitamins C & E (HAIR/SKIN/NAILS PO), Take 1 tablet by mouth daily., Disp: , Rfl:    bisoprolol (ZEBETA) 5 MG tablet, Take 0.5 tablets (2.5 mg total) by mouth daily., Disp: 45 tablet, Rfl: 3   clopidogrel (PLAVIX) 75 MG tablet, Take 1 tablet (75 mg total) by mouth  daily., Disp: 90 tablet, Rfl: 3   diclofenac sodium (VOLTAREN) 1 % GEL, Apply 2 g topically 4 (four) times daily. (Patient taking differently: Apply 2 g topically 4 (four) times daily as needed (for hand pain).), Disp: 100 g, Rfl: 0   fluticasone-salmeterol (ADVAIR DISKUS) 250-50 MCG/ACT AEPB, Inhale 1 puff into the lungs in the morning and at bedtime., Disp: 60 each, Rfl: 11   HYDROcodone-acetaminophen (NORCO/VICODIN) 5-325 MG tablet, Take 1 tablet by mouth every 6 (six) hours as needed for moderate pain. Chronic Pain. Dx: G89.4, Disp: 130 tablet, Rfl: 0   isosorbide mononitrate (IMDUR) 60 MG 24 hr tablet, Take 1 tablet (60 mg total) by mouth daily., Disp: 90 tablet, Rfl: 2   lipase/protease/amylase (CREON) 36000 UNITS CPEP capsule, Take 2 capsules (72,000 Units total) by mouth 3 (three) times daily with meals. May also take 1 capsule (36,000 Units total) as needed (with snacks)., Disp: 240 capsule, Rfl: 11   metFORMIN (GLUCOPHAGE) 1000 MG tablet, TAKE 1 TABLET BY MOUTH TWICE DAILY WITH MEALS, Disp: 180 tablet,  Rfl: 0   methocarbamol (ROBAXIN) 500 MG tablet, TAKE 1 TABLET BY MOUTH EVERY 6 HOURS AS NEEDED FOR MUSCLE SPASM, Disp: 120 tablet, Rfl: 2   Multiple Vitamin (MULTIVITAMIN) tablet, Take 1 tablet by mouth daily., Disp: , Rfl:    nitroGLYCERIN (NITROSTAT) 0.4 MG SL tablet, Place 1 tablet under the tongue every 5 (five) minutes as needed for chest pain., Disp: , Rfl:    ondansetron (ZOFRAN) 4 MG tablet, TAKE 1 TABLET(4 MG) BY MOUTH EVERY 8 HOURS AS NEEDED FOR NAUSEA OR VOMITING, Disp: 90 tablet, Rfl: 1   pioglitazone (ACTOS) 30 MG tablet, Take 1 tablet (30 mg total) by mouth daily., Disp: 90 tablet, Rfl: 3   Probiotic Product (TRUBIOTICS PO), Take 1 capsule by mouth daily., Disp: , Rfl:    rosuvastatin (CRESTOR) 40 MG tablet, TAKE 1 TABLET( 40 MG TOTAL) BY MOUTH NIGHTLY, Disp: 90 tablet, Rfl: 3   vitamin B-12 (CYANOCOBALAMIN) 1000 MCG tablet, Take 1,000 mcg by mouth daily., Disp: , Rfl:     pantoprazole (PROTONIX) 40 MG tablet, Take 1 tablet (40 mg total) by mouth 2 (two) times daily before a meal., Disp: 180 tablet, Rfl: 0  Current Facility-Administered Medications:    triamcinolone acetonide (KENALOG) 10 MG/ML injection 10 mg, 10 mg, Other, Once, Regal, Kirstie Peri, DPM   Vital signs in last 24 hrs: Vitals:   09/09/20 1511  BP: (!) 118/58  Pulse: 92  SpO2: 98%   Wt Readings from Last 3 Encounters:  09/09/20 169 lb (76.7 kg)  06/30/20 163 lb 3.2 oz (74 kg)  06/19/20 159 lb (72.1 kg)    Physical Exam  Well-appearing, no muscle wasting HEENT: sclera anicteric, oral mucosa moist without lesions Neck: supple, no thyromegaly, JVD or lymphadenopathy Cardiac: RRR without murmurs, S1S2 heard, no peripheral edema Pulm: clear to auscultation bilaterally, normal RR and effort noted Abdomen: soft, no tenderness, with active bowel sounds. No guarding or palpable hepatosplenomegaly. Skin; warm and dry, no jaundice or rash  Labs:   ___________________________________________ Radiologic studies:   ____________________________________________ Other:   _____________________________________________ Assessment & Plan  Assessment: Encounter Diagnoses  Name Primary?   Alcohol-induced chronic pancreatitis (HCC) Yes   Upper abdominal pain    Heartburn    Chronic pancreatitis from previous alcohol use.  We had discussed in the past how she will have episodes of this due to the pancreatic damage and architectural distortion of the pancreatic duct.  It was previously evaluated by EUS to rule out malignancy. It sounds like she was having some episodes of that late last year, manage them at home by going on a liquid diet taking her pain medication.  She has longstanding reflux, some breakthrough symptoms several days a week.  We discussed the utility but also limitation of acid suppression therapy.  She is trying hard not to eat within several hours of bed, and she quit smoking  years ago.   Plan: Increase Protonix to 40 mg twice daily.  If not covered by insurance, then she will have to take an OTC omeprazole dose once daily in addition to her current Protonix 40 mg once daily.  Let me know if dysphagia develops, as upper endoscopy would be warranted.  Ladiamond told me during the visit that she was up-to-date on colon cancer screening with a Cologuard within the last 2 years.  However, after the visit I was able to find that last Cologuard report, which was actually from March 2019.  Therefore she is due for colon cancer screening  with either Cologuard or colonoscopy. I will have our clinical staff contact her and see which she would prefer to do.  23 minutes were spent on this encounter (including chart review, history/exam, counseling/coordination of care, and documentation) > 50% of that time was spent on counseling and coordination of care.  Topics discussed included: See above.  Charlie Pitter III

## 2020-09-11 ENCOUNTER — Telehealth: Payer: Self-pay

## 2020-09-11 NOTE — Telephone Encounter (Signed)
Left message to return call x 2

## 2020-09-11 NOTE — Telephone Encounter (Signed)
-----   Message from Charlie Pitter III, MD sent at 09/09/2020  4:20 PM EDT ----- After [name]'s office visit today, I discovered from chart review that her last Cologuard test was in March 2019.  Therefore she is due for colon cancer screening with either Cologuard or colonoscopy.  Please call her and ask her which she would prefer to do.  If she wants colonoscopy, we will need to contact her vascular surgeon to discuss a 5-day hold of the Plavix before procedure.  If she would like a Cologuard, we can place that order for her  - HD

## 2020-09-13 ENCOUNTER — Other Ambulatory Visit: Payer: Self-pay | Admitting: Gastroenterology

## 2020-09-17 ENCOUNTER — Other Ambulatory Visit: Payer: Self-pay | Admitting: Family Medicine

## 2020-09-17 MED ORDER — HYDROCODONE-ACETAMINOPHEN 5-325 MG PO TABS: 1.0000 | ORAL_TABLET | Freq: Four times a day (QID) | ORAL | 0 refills | Status: DC | PRN

## 2020-09-17 NOTE — Telephone Encounter (Signed)
Patient called to request refill of HYDROcodone-acetaminophen (NORCO/VICODIN) 5-325 MG tablet [[id number]]   Pharmacy confirmed as   [facility] #[id number] Gin[place] Otto, [place] - [address] AT [facility] [address]  [address] [place] [address]  Phone:  [phone]  Fax:  [phone]  DEA #:  [id number]  Patient will be out of medication on 8/7.  Please advise at [phone].

## 2020-09-17 NOTE — Telephone Encounter (Signed)
Ok to refill??  Last office visit 05/08/2020.  Last refill 08/18/2020.

## 2020-09-23 NOTE — Telephone Encounter (Signed)
No contact letter was mailed to patients home address on file on 09-21-2020.

## 2020-09-29 ENCOUNTER — Encounter (HOSPITAL_COMMUNITY): Payer: 59

## 2020-10-06 ENCOUNTER — Ambulatory Visit: Payer: 59 | Admitting: Cardiovascular Disease

## 2020-10-16 ENCOUNTER — Other Ambulatory Visit: Payer: Self-pay

## 2020-10-16 MED ORDER — HYDROCODONE-ACETAMINOPHEN 5-325 MG PO TABS: 1.0000 | ORAL_TABLET | Freq: Four times a day (QID) | ORAL | 0 refills | Status: DC | PRN

## 2020-10-16 NOTE — Telephone Encounter (Signed)
Ok to refill??  Last office visit 05/08/2020.  Last refill 09/17/2020.

## 2020-10-16 NOTE — Telephone Encounter (Signed)
Pt called in requesting a refill of HYDROcodone-acetaminophen (NORCO/VICODIN) 5-325 MG tablet .   Cb#: [phone]

## 2020-10-17 ENCOUNTER — Other Ambulatory Visit: Payer: Self-pay | Admitting: Family Medicine

## 2020-10-25 ENCOUNTER — Other Ambulatory Visit: Payer: Self-pay | Admitting: Family Medicine

## 2020-10-25 DIAGNOSIS — K861 Other chronic pancreatitis: Secondary | ICD-10-CM

## 2020-10-27 ENCOUNTER — Telehealth: Payer: Self-pay | Admitting: Family Medicine

## 2020-10-27 DIAGNOSIS — K861 Other chronic pancreatitis: Secondary | ICD-10-CM

## 2020-10-27 MED ORDER — ONDANSETRON HCL 4 MG PO TABS: 4.0000 mg | ORAL_TABLET | Freq: Three times a day (TID) | ORAL | 1 refills | Status: DC | PRN

## 2020-10-27 NOTE — Telephone Encounter (Signed)
Patient called to follow up on issue with insurance not approving refill for ondansetron (ZOFRAN) 4 MG tablet [[id number]]   Patient took last pill this morning.   Pharmacy unable to fill without clarification.  Pharmacy verified as   [facility] #[id number] Ginette Otto, [place] - [address] AT [facility] [address]  [address], [place] [address]  Phone:  [phone]  Fax:  [phone]  DEA #:  [id number]  Please advise at [phone].

## 2020-10-27 NOTE — Telephone Encounter (Signed)
Prescription sent to pharmacy.

## 2020-11-07 ENCOUNTER — Other Ambulatory Visit: Payer: Self-pay | Admitting: Family Medicine

## 2020-11-17 ENCOUNTER — Other Ambulatory Visit: Payer: Self-pay

## 2020-11-17 ENCOUNTER — Ambulatory Visit (INDEPENDENT_AMBULATORY_CARE_PROVIDER_SITE_OTHER): Payer: 59 | Admitting: Cardiovascular Disease

## 2020-11-17 ENCOUNTER — Ambulatory Visit (HOSPITAL_COMMUNITY)
Admission: RE | Admit: 2020-11-17 | Discharge: 2020-11-17 | Disposition: A | Discharge status: Home / Self Care | Payer: 59 | Source: Ambulatory Visit | Attending: Cardiovascular Disease | Admitting: Cardiovascular Disease

## 2020-11-17 VITALS — BP 133/76 | HR 76 | Ht 62.0 in | Wt 177.2 lb

## 2020-11-17 DIAGNOSIS — E785 Hyperlipidemia, unspecified: Secondary | ICD-10-CM

## 2020-11-17 DIAGNOSIS — I25118 Atherosclerotic heart disease of native coronary artery with other forms of angina pectoris: Secondary | ICD-10-CM

## 2020-11-17 DIAGNOSIS — I739 Peripheral vascular disease, unspecified: Secondary | ICD-10-CM

## 2020-11-17 NOTE — Patient Instructions (Signed)
Medication Instructions:  STOP the Aspirin  *If you need a refill on your cardiac medications before your next appointment, please call your pharmacy*   Lab Work: None ordered If you have labs (blood work) drawn today and your tests are completely normal, you will receive your results only by: . MyChart Message (if you have MyChart) OR . A paper copy in the mail If you have any lab test that is abnormal or we need to change your treatment, we will call you to review the results.   Testing/Procedures: None ordered   Follow-Up: At [facility], you and your health needs are our priority.  As part of our continuing mission to provide you with exceptional heart care, we have created designated Provider Care Teams.  These Care Teams include your primary Cardiologist (physician) and Advanced Practice Providers (APPs -  Physician Assistants and Nurse Practitioners) who all work together to provide you with the care you need, when you need it.  We recommend signing up for the patient portal called "MyChart".  Sign up information is provided on this After Visit Summary.  MyChart is used to connect with patients for Virtual Visits (Telemedicine).  Patients are able to view lab/test results, encounter notes, upcoming appointments, etc.  Non-urgent messages can be sent to your provider as well.   To learn more about what you can do with MyChart, go to [email or web address].    Your next appointment:   6 month(s)  The format for your next appointment:   In Person  Provider:   Muhammad Arida, MD    

## 2020-11-17 NOTE — Progress Notes (Signed)
Cardiology Office Note   Date:  11/17/2020   ID:  [name], DOB 1955/07/10, MRN [id number]  PCP:  Donita Brooks, MD  Cardiologist:  Jessy Oto chief complaint on file.     History of Present Illness: [name] is a 65 y.o. female who presents for a follow up visit regarding regarding peripheral arterial disease and coronary artery disease. She has known history of peripheral arterial disease status post atherectomy of the right SFA in 2011 by Dr. Sanjuana Kava. She has other chronic medical conditions including CAD, DM, HTN, recurrent idiopathic pancreatitis and hyperlipidemia. She is a former smoker.  She is s/p bilateral TKR.   She had worsening right leg claudication in April 2017. Angiography showed no significant aortoiliac disease. There was moderate right common femoral artery stenosis and severe discrete stenosis in the proximal right SFA with three-vessel runoff below the knee. There was no significant obstructive disease involving the left lower extremity. A self-expanding stent was placed to the proximal right SFA. She had previous bilateral knee replacement and chronic bilateral leg pain.   She had worsening left leg claudication in 2021.  Vascular studies showed mildly reduced ABI bilaterally in the 0.8 range.  Duplex showed moderate right common femoral artery stenosis and moderate SFA disease.  On the left, there was severe new stenosis in the proximal SFA. Angiography was done in March of 2021 which showed borderline stenosis affecting the left common iliac artery, severe calcified stenosis affecting the left proximal SFA and three-vessel runoff below the knee.  On the right, there was severe calcified disease affecting the common femoral artery with patent proximal SFA stent followed by 60% disease throughout the whole SFA and three-vessel runoff below the knee.  I performed successful orbital atherectomy and drug-coated balloon angioplasty to the left  SFA.   She underwent staged right common femoral artery endarterectomy by Dr. Chestine Spore in August of 2021.  She has been doing well with no recent chest pain or worsening dyspnea.  She continues to have mild neuropathic pain affecting both lower extremities.  She takes her medications regularly and her diabetes mellitus has improved overall.    Past Medical History:  Diagnosis Date   Aortic atherosclerosis (HCC)    Asthma with COPD (HCC)    PATIENT DENIES   CAD (coronary artery disease)    a. LHC 2011: Diffuse distal and branch vessel disease - patient managed medically, no interventional options. b. Nuc 03/2014 - low risk, no ischemia.   Complication of anesthesia    slow to wake up with last surgery in 2011    Diabetes mellitus with circulatory complication (HCC)    Diverticulosis    Dyslipidemia    Headache    hx of migraines    Hypertension    Osteoarthritis    severe right knee  R TKR   PAD (peripheral artery disease) (HCC)    a. Severe stenosis mid right SFA s/p atherectomy 03/25/09. b. peripheral angiography in 12/2011 which showed only about 50% diffuse right SFA stenosis.   Pancreatitis 2003   Renal artery stenosis (HCC)    a. LHC 2011 - 40-50% left RAS.   Tobacco use disorder    quit 11/10    Past Surgical History:  Procedure Laterality Date   ABDOMINAL AORTAGRAM N/A 12/21/2011   Procedure: ABDOMINAL AORTAGRAM;  Surgeon: Iran Ouch, MD;  Location: [facility];  Service: Cardiovascular;  Laterality: N/A;   ABDOMINAL AORTOGRAM W/LOWER EXTREMITY Bilateral 05/08/2019  Procedure: ABDOMINAL AORTOGRAM W/LOWER EXTREMITY;  Surgeon: Iran Ouch, MD;  [facility]: [facility];  Service: Cardiovascular;  Laterality: Bilateral;   CARDIAC CATHETERIZATION     ENDARTERECTOMY FEMORAL Right 10/10/2019   Procedure: RIGHT FEMORAL ENDARTERECTOMY;  Surgeon: Cephus Shelling, MD;  Location: [facility] OR;  Service: Vascular;  Laterality: Right;   ESOPHAGOGASTRODUODENOSCOPY (EGD) WITH  PROPOFOL N/A 10/05/2017   Procedure: ESOPHAGOGASTRODUODENOSCOPY (EGD) WITH PROPOFOL;  Surgeon: Rachael Fee, MD;  Location: [facility];  Service: Endoscopy;  Laterality: N/A;   EUS N/A 10/05/2017   Procedure: UPPER ENDOSCOPIC ULTRASOUND (EUS) RADIAL;  Surgeon: Rachael Fee, MD;  Location: [facility];  Service: Endoscopy;  Laterality: N/A;   FOOT SURGERY Left    LOWER EXTREMITY ANGIOGRAM Bilateral 05/27/2015   Procedure: Lower Extremity Angiogram;  Surgeon: Iran Ouch, MD;  [facility]: [facility];  Service: Cardiovascular;  Laterality: Bilateral;   PATCH ANGIOPLASTY Right 10/10/2019   Procedure: PATCH ANGIOPLASTY USING Livia Snellen BIOLOGIC PATCH;  Surgeon: Cephus Shelling, MD;  Location: [facility] OR;  Service: Vascular;  Laterality: Right;   PERIPHERAL VASCULAR ATHERECTOMY Left 05/08/2019   Procedure: PERIPHERAL VASCULAR ATHERECTOMY;  Surgeon: Iran Ouch, MD;  [facility];  Service: Cardiovascular;  Laterality: Left;   PERIPHERAL VASCULAR CATHETERIZATION N/A 05/27/2015   Procedure: Abdominal Aortogram;  Surgeon: Iran Ouch, MD;  [facility]: [facility];  Service: Cardiovascular;  Laterality: N/A;   PERIPHERAL VASCULAR CATHETERIZATION Right 05/27/2015   Procedure: Peripheral Vascular Intervention;  Surgeon: Iran Ouch, MD;  [facility];  Service: Cardiovascular;  Laterality: Right;  SFA   TOTAL KNEE ARTHROPLASTY Right 2011   TOTAL KNEE ARTHROPLASTY Left 05/22/2014   Procedure: LEFT TOTAL KNEE ARTHROPLASTY;  Surgeon: Ranee Gosselin, MD;  Location: [facility];  Service: Orthopedics;  Laterality: Left;   TUBAL LIGATION  1980     Current Outpatient Medications  Medication Sig Dispense Refill   acetaminophen (TYLENOL) 500 MG tablet Take 1,000 mg by mouth every 6 (six) hours as needed for moderate pain or headache.     albuterol (VENTOLIN HFA) 108 (90 Base) MCG/ACT inhaler INHALE 2 PUFFS INTO THE LUNGS EVERY 6 HOURS AS NEEDED FOR  WHEEZING OR SHORTNESS OF BREATH 18 g 3   amitriptyline (ELAVIL) 50 MG tablet TAKE 1 TABLET(50 MG) BY MOUTH AT BEDTIME 30 tablet 6   Biotin w/ Vitamins C & E (HAIR/SKIN/NAILS PO) Take 1 tablet by mouth daily.     bisoprolol (ZEBETA) 5 MG tablet Take 0.5 tablets (2.5 mg total) by mouth daily. 45 tablet 3   clopidogrel (PLAVIX) 75 MG tablet Take 1 tablet (75 mg total) by mouth daily. 90 tablet 3   diclofenac sodium (VOLTAREN) 1 % GEL Apply 2 g topically 4 (four) times daily. (Patient taking differently: Apply 2 g topically 4 (four) times daily as needed (for hand pain).) 100 g 0   fluticasone-salmeterol (ADVAIR DISKUS) 250-50 MCG/ACT AEPB Inhale 1 puff into the lungs in the morning and at bedtime. 60 each 11   HYDROcodone-acetaminophen (NORCO/VICODIN) 5-325 MG tablet Take 1 tablet by mouth every 6 (six) hours as needed for moderate pain. Chronic Pain. Dx: G89.4 130 tablet 0   isosorbide mononitrate (IMDUR) 60 MG 24 hr tablet Take 1 tablet (60 mg total) by mouth daily. 90 tablet 2   lipase/protease/amylase (CREON) 36000 UNITS CPEP capsule Take 2 capsules (72,000 Units total) by mouth 3 (three) times daily with meals. May also take 1 capsule (36,000  Units total) as needed (with snacks). 240 capsule 11   metFORMIN (GLUCOPHAGE) 1000 MG tablet TAKE 1 TABLET BY MOUTH TWICE DAILY WITH MEALS 180 tablet 0   methocarbamol (ROBAXIN) 500 MG tablet TAKE 1 TABLET BY MOUTH EVERY 6 HOURS AS NEEDED FOR MUSCLE SPASM 120 tablet 2   Multiple Vitamin (MULTIVITAMIN) tablet Take 1 tablet by mouth daily.     nitroGLYCERIN (NITROSTAT) 0.4 MG SL tablet Place 1 tablet under the tongue every 5 (five) minutes as needed for chest pain.     ondansetron (ZOFRAN) 4 MG tablet Take 1 tablet (4 mg total) by mouth every 8 (eight) hours as needed for nausea or vomiting. 90 tablet 1   pantoprazole (PROTONIX) 40 MG tablet Take 1 tablet (40 mg total) by mouth 2 (two) times daily before a meal. 180 tablet 0   pioglitazone (ACTOS) 30 MG tablet  Take 1 tablet (30 mg total) by mouth daily. 90 tablet 3   Probiotic Product (TRUBIOTICS PO) Take 1 capsule by mouth daily.     rosuvastatin (CRESTOR) 40 MG tablet TAKE 1 TABLET( 40 MG TOTAL) BY MOUTH NIGHTLY 90 tablet 3   vitamin B-12 (CYANOCOBALAMIN) 1000 MCG tablet Take 1,000 mcg by mouth daily.     Current Facility-Administered Medications  Medication Dose Route Frequency Provider Last Rate Last Admin   triamcinolone acetonide (KENALOG) 10 MG/ML injection 10 mg  10 mg Other Once Regal, Norman S, DPM        Allergies:   Gabapentin, Glucotrol [glipizide], Influenza vaccines, Latex, Nortriptyline, Tetanus toxoid, Other, and Invokana [canagliflozin]    Social History:  The patient  reports that she quit smoking about 11 years ago. Her smoking use included cigarettes. She has never used smokeless tobacco. She reports that she does not drink alcohol and does not use drugs.   Family History:  The patient's family history includes Breast cancer (age of onset: 41) in her sister; CVA in her mother; Emphysema in her father.    ROS:  Please see the history of present illness.   Otherwise, review of systems are positive for .   All other systems are reviewed and negative.    PHYSICAL EXAM: VS:  BP 133/76   Pulse 76   Ht 5\' 2"  (1.575 m)   Wt 177 lb 3.2 oz (80.4 kg)   SpO2 100%   BMI 32.41 kg/m  , BMI Body mass index is 32.41 kg/m. GEN: Well nourished, well developed, in no acute distress  HEENT: normal  Neck: no JVD, carotid bruits, or masses Cardiac: RRR; no murmurs, rubs, or gallops,no edema  Respiratory:  clear to auscultation bilaterally, normal work of breathing GI: soft, nontender, nondistended, + BS MS: no deformity or atrophy  Skin: warm and dry, no rash Neuro:  Strength and sensation are intact Psych: euthymic mood, full affect   EKG:  EKG is ordered today. EKG showed normal sinus rhythm with no significant ST or T wave changes.    Recent Labs: 04/03/2020: ALT 14; BUN 22;  Creat 1.01; Hemoglobin 12.4; Platelets 303; Potassium 4.7; Sodium 138 05/08/2020: TSH 2.02    Lipid Panel    Component Value Date/Time   CHOL 154 04/03/2020 0846   TRIG 207 (H) 04/03/2020 0846   HDL 56 04/03/2020 0846   CHOLHDL 2.8 04/03/2020 0846   VLDL 35 10/11/2019 0219   LDLCALC 71 04/03/2020 0846   LDLDIRECT 50.6 01/02/2014 1003      Wt Readings from Last 3 Encounters:  11/17/20 177 lb 3.2  oz (80.4 kg)  09/09/20 169 lb (76.7 kg)  06/30/20 163 lb 3.2 oz (74 kg)       ASSESSMENT AND PLAN:  1.  PAD with claudication: Status post left SFA atherectomy and drug-coated balloon angioplasty as well as right common femoral artery endarterectomy.  She continues to have residual right calf claudication likely due to right SFA disease.   She underwent vascular studies today which were personally reviewed by me and showed normal ABI on the left side and borderline on the right side at 0.93.  Duplex showed patent left SFA.  On the right side, the common femoral artery was patent with moderate disease in the mid SFA with velocity around 350. I recommend continuing medical therapy.  I discontinued aspirin and asked her to continue clopidogrel long-term. A lot of her leg symptoms seem to be neuropathic at this point.  2. Coronary artery disease involving native coronary arteries with other forms of angina:   Previous cardiac catheterization in 2011 showed distal LAD and small branch disease which is being managed medically.  Most recent [facility] showed evidence of mild anterior wall ischemia.  She has been treated medically given stability of symptoms.   3. Hyperlipidemia:  Continue treatment with high-dose rosuvastatin.  I reviewed most recent lipid profile done in February which showed an LDL of 71.  4. Diabetes mellitus: Managed by primary care physician.    Disposition:   FU with me in 6 months  Signed,  Lorine Bears, MD  11/17/2020 10:47 AM    [facility]

## 2020-11-18 ENCOUNTER — Other Ambulatory Visit: Payer: Self-pay

## 2020-11-18 NOTE — Telephone Encounter (Signed)
Ok to refill??  Last office visit 10/27/2020.  Last refill 10/16/2020.

## 2020-11-18 NOTE — Telephone Encounter (Signed)
Pt called in requesting a refill HYDROcodone-acetaminophen (NORCO/VICODIN) 5-325 MG tablet   Cb#; [phone]

## 2020-11-19 MED ORDER — HYDROCODONE-ACETAMINOPHEN 5-325 MG PO TABS: 1.0000 | ORAL_TABLET | Freq: Four times a day (QID) | ORAL | 0 refills | Status: DC | PRN

## 2020-12-09 ENCOUNTER — Other Ambulatory Visit: Payer: Self-pay | Admitting: Family Medicine

## 2020-12-10 ENCOUNTER — Other Ambulatory Visit: Payer: Self-pay | Admitting: Gastroenterology

## 2020-12-21 ENCOUNTER — Other Ambulatory Visit: Payer: Self-pay | Admitting: Family Medicine

## 2020-12-21 ENCOUNTER — Telehealth: Payer: Self-pay

## 2020-12-21 MED ORDER — HYDROCODONE-ACETAMINOPHEN 5-325 MG PO TABS: 1.0000 | ORAL_TABLET | Freq: Four times a day (QID) | ORAL | 0 refills | Status: DC | PRN

## 2020-12-21 NOTE — Telephone Encounter (Signed)
Letter sent.

## 2020-12-21 NOTE — Telephone Encounter (Signed)
Pt called in requesting  a refill of HYDROcodone-acetaminophen (NORCO/VICODIN) 5-325 MG tablet sent to pharmacy.  Cb#: [phone]

## 2020-12-21 NOTE — Telephone Encounter (Signed)
LOV 05/08/20 Last refill 11/19/20, #130, 0 refills  Please review. Thank you!

## 2020-12-22 ENCOUNTER — Other Ambulatory Visit: Payer: Self-pay | Admitting: Family Medicine

## 2021-01-11 ENCOUNTER — Ambulatory Visit (INDEPENDENT_AMBULATORY_CARE_PROVIDER_SITE_OTHER): Payer: 59 | Admitting: Family Medicine

## 2021-01-11 ENCOUNTER — Encounter: Payer: Self-pay | Admitting: Family Medicine

## 2021-01-11 ENCOUNTER — Other Ambulatory Visit: Payer: Self-pay

## 2021-01-11 VITALS — BP 122/62 | HR 89 | Temp 97.2°F | Resp 18 | Ht 62.0 in | Wt 178.0 lb

## 2021-01-11 DIAGNOSIS — I2510 Atherosclerotic heart disease of native coronary artery without angina pectoris: Secondary | ICD-10-CM | POA: Diagnosis not present

## 2021-01-11 DIAGNOSIS — N1831 Chronic kidney disease, stage 3a: Secondary | ICD-10-CM

## 2021-01-11 DIAGNOSIS — E1140 Type 2 diabetes mellitus with diabetic neuropathy, unspecified: Secondary | ICD-10-CM

## 2021-01-11 DIAGNOSIS — I10 Essential (primary) hypertension: Secondary | ICD-10-CM | POA: Diagnosis not present

## 2021-01-11 MED ORDER — PREGABALIN 50 MG PO CAPS: 50.0000 mg | ORAL_CAPSULE | Freq: Three times a day (TID) | ORAL | 3 refills | Status: DC

## 2021-01-11 MED ORDER — PANTOPRAZOLE SODIUM 40 MG PO TBEC: 40.0000 mg | DELAYED_RELEASE_TABLET | Freq: Two times a day (BID) | ORAL | 3 refills | Status: DC

## 2021-01-11 NOTE — Progress Notes (Signed)
Subjective:    Patient ID: [name], female    DOB: 03-29-1955, 65 y.o.   MRN: [id number]  Patient is here today to follow-up her diabetes at our request.  Her last lab work was back in February.  She states that she is slipped with her diet and has not been consistent and eating a low carbohydrate diet.  She is not checking her blood sugars regularly although she denies any polyuria polydipsia or blurry vision.  She continues to complain of neuropathy in her legs.  We tried amitriptyline which left her feeling groggy the following day and did not help with the neuropathic pain.  Gabapentin caused her to have "brain shakes".  She has never tried Lyrica and has never tried Cymbalta.  She also complains of arthritic pain in the index finger of her left hand.  She complains of pain in the MCP joint and the PIP joint.  She is unable to take NSAIDs due to her coronary artery disease.  Voltaren gel did not help.  She continues to have neuropathy in her hands due to carpal tunnel syndrome.  She gets numbness in the first second and third fingers on both hands occasionally but she declines referral to orthopedic surgery or cortisone injection Past Medical History:  Diagnosis Date   Aortic atherosclerosis (HCC)    Asthma with COPD (HCC)    PATIENT DENIES   CAD (coronary artery disease)    a. LHC 2011: Diffuse distal and branch vessel disease - patient managed medically, no interventional options. b. Nuc 03/2014 - low risk, no ischemia.   Complication of anesthesia    slow to wake up with last surgery in 2011    Diabetes mellitus with circulatory complication (HCC)    Diverticulosis    Dyslipidemia    Headache    hx of migraines    Hypertension    Osteoarthritis    severe right knee  R TKR   PAD (peripheral artery disease) (HCC)    a. Severe stenosis mid right SFA s/p atherectomy 03/25/09. b. peripheral angiography in 12/2011 which showed only about 50% diffuse right SFA stenosis.    Pancreatitis 2003   Renal artery stenosis (HCC)    a. LHC 2011 - 40-50% left RAS.   Tobacco use disorder    quit 11/10   Past Surgical History:  Procedure Laterality Date   ABDOMINAL AORTAGRAM N/A 12/21/2011   Procedure: ABDOMINAL AORTAGRAM;  Surgeon: Iran Ouch, MD;  Location: [facility];  Service: Cardiovascular;  Laterality: N/A;   ABDOMINAL AORTOGRAM W/LOWER EXTREMITY Bilateral 05/08/2019   Procedure: ABDOMINAL AORTOGRAM W/LOWER EXTREMITY;  Surgeon: Iran Ouch, MD;  [facility]: [facility];  Service: Cardiovascular;  Laterality: Bilateral;   CARDIAC CATHETERIZATION     ENDARTERECTOMY FEMORAL Right 10/10/2019   Procedure: RIGHT FEMORAL ENDARTERECTOMY;  Surgeon: Cephus Shelling, MD;  Location: [facility] OR;  Service: Vascular;  Laterality: Right;   ESOPHAGOGASTRODUODENOSCOPY (EGD) WITH PROPOFOL N/A 10/05/2017   Procedure: ESOPHAGOGASTRODUODENOSCOPY (EGD) WITH PROPOFOL;  Surgeon: Rachael Fee, MD;  Location: [facility];  Service: Endoscopy;  Laterality: N/A;   EUS N/A 10/05/2017   Procedure: UPPER ENDOSCOPIC ULTRASOUND (EUS) RADIAL;  Surgeon: Rachael Fee, MD;  Location: [facility];  Service: Endoscopy;  Laterality: N/A;   FOOT SURGERY Left    LOWER EXTREMITY ANGIOGRAM Bilateral 05/27/2015   Procedure: Lower Extremity Angiogram;  Surgeon: Iran Ouch, MD;  Location: [facility];  Service: Cardiovascular;  Laterality: Bilateral;  PATCH ANGIOPLASTY Right 10/10/2019   Procedure: PATCH ANGIOPLASTY USING Rueben Bash BIOLOGIC PATCH;  Surgeon: Marty Heck, MD;  Location: [place];  Service: Vascular;  Laterality: Right;   PERIPHERAL VASCULAR ATHERECTOMY Left 05/08/2019   Procedure: PERIPHERAL VASCULAR ATHERECTOMY;  Surgeon: Wellington Hampshire, MD;  Location: [place] CV LAB;  Service: Cardiovascular;  Laterality: Left;   PERIPHERAL VASCULAR CATHETERIZATION N/A 05/27/2015   Procedure: Abdominal Aortogram;  Surgeon: Wellington Hampshire, MD;  Location: [place]  CV LAB;  Service: Cardiovascular;  Laterality: N/A;   PERIPHERAL VASCULAR CATHETERIZATION Right 05/27/2015   Procedure: Peripheral Vascular Intervention;  Surgeon: Wellington Hampshire, MD;  Location: [place] [facility];  Service: Cardiovascular;  Laterality: Right;  SFA   TOTAL KNEE ARTHROPLASTY Right 2011   TOTAL KNEE ARTHROPLASTY Left 05/22/2014   Procedure: LEFT TOTAL KNEE ARTHROPLASTY;  Surgeon: Latanya Maudlin, MD;  Location: [facility];  Service: Orthopedics;  Laterality: Left;   TUBAL LIGATION  1980   Current Outpatient Medications on File Prior to Visit  Medication Sig Dispense Refill   acetaminophen (TYLENOL) 500 MG tablet Take 1,000 mg by mouth every 6 (six) hours as needed for moderate pain or headache.     albuterol (VENTOLIN HFA) 108 (90 Base) MCG/ACT inhaler INHALE 2 PUFFS INTO THE LUNGS EVERY 6 HOURS AS NEEDED FOR WHEEZING OR SHORTNESS OF BREATH 18 g 3   Biotin w/ Vitamins C & E (HAIR/SKIN/NAILS PO) Take 1 tablet by mouth daily.     bisoprolol (ZEBETA) 5 MG tablet Take 0.5 tablets (2.5 mg total) by mouth daily. 45 tablet 3   clopidogrel (PLAVIX) 75 MG tablet Take 1 tablet (75 mg total) by mouth daily. 90 tablet 3   diclofenac sodium (VOLTAREN) 1 % GEL Apply 2 g topically 4 (four) times daily. (Patient taking differently: Apply 2 g topically 4 (four) times daily as needed (for hand pain).) 100 g 0   fluticasone-salmeterol (ADVAIR DISKUS) 250-50 MCG/ACT AEPB Inhale 1 puff into the lungs in the morning and at bedtime. 60 each 11   HYDROcodone-acetaminophen (NORCO/VICODIN) 5-325 MG tablet Take 1 tablet by mouth every 6 (six) hours as needed for moderate pain. Chronic Pain. Dx: G89.4 130 tablet 0   isosorbide mononitrate (IMDUR) 60 MG 24 hr tablet Take 1 tablet (60 mg total) by mouth daily. 90 tablet 2   lipase/protease/amylase (CREON) 36000 UNITS CPEP capsule Take 2 capsules (72,000 Units total) by mouth 3 (three) times daily with meals. May also take 1 capsule (36,000 Units total) as needed (with  snacks). 240 capsule 11   metFORMIN (GLUCOPHAGE) 1000 MG tablet TAKE 1 TABLET BY MOUTH TWICE DAILY WITH MEALS 180 tablet 0   methocarbamol (ROBAXIN) 500 MG tablet TAKE 1 TABLET BY MOUTH EVERY 6 HOURS AS NEEDED FOR MUSCLE SPASM 120 tablet 2   Multiple Vitamin (MULTIVITAMIN) tablet Take 1 tablet by mouth daily.     nitroGLYCERIN (NITROSTAT) 0.4 MG SL tablet Place 1 tablet under the tongue every 5 (five) minutes as needed for chest pain.     ondansetron (ZOFRAN) 4 MG tablet Take 1 tablet (4 mg total) by mouth every 8 (eight) hours as needed for nausea or vomiting. 90 tablet 1   pioglitazone (ACTOS) 30 MG tablet TAKE 1 TABLET(30 MG) BY MOUTH DAILY 90 tablet 3   Probiotic Product (TRUBIOTICS PO) Take 1 capsule by mouth daily.     rosuvastatin (CRESTOR) 40 MG tablet TAKE 1 TABLET( 40 MG TOTAL) BY MOUTH NIGHTLY 90 tablet 3   vitamin  B-12 (CYANOCOBALAMIN) 1000 MCG tablet Take 1,000 mcg by mouth daily.     amitriptyline (ELAVIL) 50 MG tablet TAKE 1 TABLET(50 MG) BY MOUTH AT BEDTIME (Patient not taking: Reported on 01/11/2021) 30 tablet 6   Current Facility-Administered Medications on File Prior to Visit  Medication Dose Route Frequency Provider Last Rate Last Admin   triamcinolone acetonide (KENALOG) 10 MG/ML injection 10 mg  10 mg Other Once Regal, Norman S, DPM       Allergies  Allergen Reactions   Gabapentin Other (See Comments)    Brain shakes   Glucotrol [Glipizide] Rash and Other (See Comments)    Red rash   Influenza Vaccines Other (See Comments)    Significant arm soreness requiring 1 year of physical therapy   Latex Rash   Nortriptyline Other (See Comments)    BRAIN SHAKE   Tetanus Toxoid Hives, Swelling, Rash and Other (See Comments)    Swelling to site of injection with rash and fever   Other Itching, Other (See Comments) and Cough    Patient is highly allergic to Cats and she begins to break out in rash and turns red if someone caring for her has cats at home.  Patient takes Claritin  for this type of reaction.  Patient stated that if it goes untreated then he begins to have difficulty breathing   Invokana [Canagliflozin] Other (See Comments)    Yeast Infections   Social History   Socioeconomic History   Marital status: Married    Spouse name: Not on file   Number of children: Not on file   Years of education: 10   Highest education level: Not on file  Occupational History   Not on file  Tobacco Use   Smoking status: Former    Types: Cigarettes    Quit date: 12/13/2008    Years since quitting: 12.0   Smokeless tobacco: Never  Vaping Use   Vaping Use: Never used  Substance and Sexual Activity   Alcohol use: No    Comment: Quit drinking around year 2000   Drug use: No    Comment: Patient quit smoking around 2011   Sexual activity: Yes    Partners: Male  Other Topics Concern   Not on file  Social History Narrative   Denies any IV drug use or marijuana use.  Former smoker for 30 years, quit Nov. 2010. She is married with 2 children. Disabled from knee, not working.   Social Determinants of Health   Financial Resource Strain: Not on file  Food Insecurity: Not on file  Transportation Needs: Not on file  Physical Activity: Not on file  Stress: Not on file  Social Connections: Not on file  Intimate Partner Violence: Not on file   Family History  Problem Relation Age of Onset   CVA Mother    Emphysema Father    Breast cancer Sister 46   Colon cancer Neg Hx      Review of Systems  All other systems reviewed and are negative.     Objective:   Physical Exam Vitals reviewed.  Constitutional:      General: She is not in acute distress.    Appearance: She is well-developed. She is not diaphoretic.  HENT:     Right Ear: External ear normal.     Left Ear: External ear normal.     Nose: Nose normal.  Eyes:     Conjunctiva/sclera: Conjunctivae normal.  Neck:     Thyroid: Thyromegaly present.  Vascular: No JVD.  Cardiovascular:     Rate and  Rhythm: Normal rate and regular rhythm.     Heart sounds: Normal heart sounds. No murmur heard. Pulmonary:     Effort: Pulmonary effort is normal.     Breath sounds: Normal breath sounds. No wheezing or rales.  Chest:     Chest wall: No tenderness.  Abdominal:     General: Bowel sounds are normal. There is no distension.     Palpations: Abdomen is soft. There is no mass.     Tenderness: There is no abdominal tenderness. There is no guarding or rebound.  Musculoskeletal:     Cervical back: Neck supple.     Left foot: Tenderness and bony tenderness present.       Feet:  Lymphadenopathy:     Cervical: No cervical adenopathy.  Skin:    Findings: No erythema or rash.  Neurological:     Mental Status: She is alert and oriented to person, place, and time.     Cranial Nerves: No cranial nerve deficit.     Motor: No abnormal muscle tone.     Coordination: Coordination normal.     Deep Tendon Reflexes: Reflexes are normal and symmetric.        Assessment & Plan:  Type 2 diabetes mellitus with diabetic neuropathy, unspecified whether long term insulin use (HCC) - Plan: Hemoglobin A1c, Microalbumin, urine, COMPLETE METABOLIC PANEL WITH GFR, Lipid panel  Coronary artery disease involving native heart without angina pectoris, unspecified vessel or lesion type  Essential hypertension  Stage 3a chronic kidney disease (HCC) Blood pressures acceptable.  I do not believe that her pain in her feet is due to claudication but is rather due to diabetic neuropathy.  We discussed Cymbalta versus Lyrica and she elects to try Lyrica 50 mg p.o. 3 times daily.  Check a hemoglobin A1c.  Goal A1c is less than 6.5.  Check a fasting lipid panel.  Goal LDL cholesterol is less than 70.  Monitor kidney function with a CMP.  Offered a cortisone injection or referral to hand specialist for the arthritic pain in her left second PIP joint but she declines this as well as a declined referral due to carpal tunnel  syndrome

## 2021-01-12 ENCOUNTER — Other Ambulatory Visit (HOSPITAL_COMMUNITY): Payer: Self-pay | Admitting: Cardiovascular Disease

## 2021-01-12 DIAGNOSIS — I739 Peripheral vascular disease, unspecified: Secondary | ICD-10-CM

## 2021-01-12 DIAGNOSIS — Z95820 Peripheral vascular angioplasty status with implants and grafts: Secondary | ICD-10-CM

## 2021-01-12 LAB — COMPLETE METABOLIC PANEL WITH GFR
AG Ratio: 1.8 (calc) (ref 1.0–2.5)
ALT: 19 U/L (ref 6–29)
AST: 18 U/L (ref 10–35)
Albumin: 4.4 g/dL (ref 3.6–5.1)
Alkaline phosphatase (APISO): 97 U/L (ref 37–153)
BUN: 20 mg/dL (ref 7–25)
CO2: 25 mmol/L (ref 20–32)
Calcium: 9.4 mg/dL (ref 8.6–10.4)
Chloride: 101 mmol/L (ref 98–110)
Creat: 1 mg/dL (ref 0.50–1.05)
Globulin: 2.5 g/dL (calc) (ref 1.9–3.7)
Glucose, Bld: 228 mg/dL — ABNORMAL HIGH (ref 65–99)
Potassium: 4.3 mmol/L (ref 3.5–5.3)
Sodium: 137 mmol/L (ref 135–146)
Total Bilirubin: 0.3 mg/dL (ref 0.2–1.2)
Total Protein: 6.9 g/dL (ref 6.1–8.1)
eGFR: 63 mL/min/{1.73_m2} (ref >=60)

## 2021-01-12 LAB — LIPID PANEL
Cholesterol: 155 mg/dL (ref <200)
HDL: 49 mg/dL — ABNORMAL LOW (ref >=50)
LDL Cholesterol (Calc): 69 mg/dL (calc)
Non-HDL Cholesterol (Calc): 106 mg/dL (calc) (ref <130)
Total CHOL/HDL Ratio: 3.2 (calc) (ref <5.0)
Triglycerides: 278 mg/dL — ABNORMAL HIGH (ref <150)

## 2021-01-12 LAB — HEMOGLOBIN A1C
Hgb A1c MFr Bld: 7.1 % of total Hgb — ABNORMAL HIGH (ref <5.7)
Mean Plasma Glucose: 157 mg/dL
eAG (mmol/L): 8.7 mmol/L

## 2021-01-12 LAB — MICROALBUMIN, URINE: Microalb, Ur: 0.6 mg/dL

## 2021-01-14 ENCOUNTER — Other Ambulatory Visit: Payer: Self-pay | Admitting: Family Medicine

## 2021-01-21 ENCOUNTER — Other Ambulatory Visit: Payer: Self-pay | Admitting: Family Medicine

## 2021-01-21 ENCOUNTER — Other Ambulatory Visit: Payer: Self-pay

## 2021-01-21 ENCOUNTER — Telehealth: Payer: Self-pay

## 2021-01-21 DIAGNOSIS — M79642 Pain in left hand: Secondary | ICD-10-CM

## 2021-01-21 MED ORDER — SITAGLIPTIN PHOSPHATE 100 MG PO TABS: 100.0000 mg | ORAL_TABLET | Freq: Every day | ORAL | 5 refills | Status: DC

## 2021-01-21 MED ORDER — HYDROCODONE-ACETAMINOPHEN 5-325 MG PO TABS: 1.0000 | ORAL_TABLET | Freq: Four times a day (QID) | ORAL | 0 refills | Status: DC | PRN

## 2021-01-21 NOTE — Telephone Encounter (Signed)
Pt's spouse called in requesting a refill of HYDROcodone-acetaminophen (NORCO/VICODIN) 5-325 MG tablet   Cb#: [phone]

## 2021-01-21 NOTE — Telephone Encounter (Signed)
Pt called in asking if she could get a referral to a hand dr about getting an injection in her hand. Pt asks that she please not be referred to the same hand surgeon that her son was referred to. Please advise.  Cb#: [phone]

## 2021-01-21 NOTE — Telephone Encounter (Signed)
Pt advised referral would be sent. Pt also advised she will need to find out name of provider she does not wish to be scheduled with, when they contact her for scheduling she can try to be scheduled with someone different. Nothing further needed.

## 2021-01-22 ENCOUNTER — Telehealth: Payer: Self-pay

## 2021-01-22 NOTE — Telephone Encounter (Signed)
Pt called in stating that she was given a new med for diabetes and was not sure if she should discontinue to take her metFORMIN (GLUCOPHAGE) 1000. Please advise.   Cb#: [phone]

## 2021-01-22 NOTE — Telephone Encounter (Signed)
Patient aware.

## 2021-01-25 ENCOUNTER — Other Ambulatory Visit: Payer: Self-pay | Admitting: Family Medicine

## 2021-01-28 ENCOUNTER — Ambulatory Visit (INDEPENDENT_AMBULATORY_CARE_PROVIDER_SITE_OTHER): Payer: 59 | Admitting: Orthopedic Surgery

## 2021-01-28 ENCOUNTER — Other Ambulatory Visit: Payer: Self-pay

## 2021-01-28 DIAGNOSIS — M65322 Trigger finger, left index finger: Secondary | ICD-10-CM | POA: Insufficient documentation

## 2021-01-28 DIAGNOSIS — M65341 Trigger finger, right ring finger: Secondary | ICD-10-CM | POA: Insufficient documentation

## 2021-01-28 MED ORDER — BETAMETHASONE SOD PHOS & ACET 6 (3-3) MG/ML IJ SUSP
6.0000 mg | INTRAMUSCULAR | Status: AC | PRN
Start: 2021-01-28 — End: 2021-01-28
  Administered 2021-01-28: 6 mg via INTRA_ARTICULAR

## 2021-01-28 MED ORDER — LIDOCAINE HCL 1 % IJ SOLN
1.0000 mL | INTRAMUSCULAR | Status: AC | PRN
  Administered 2021-01-28: 1 mL

## 2021-01-28 MED ORDER — BETAMETHASONE SOD PHOS & ACET 6 (3-3) MG/ML IJ SUSP
6.0000 mg | INTRAMUSCULAR | Status: AC | PRN
  Administered 2021-01-28: 6 mg via INTRA_ARTICULAR

## 2021-01-28 NOTE — Progress Notes (Signed)
Office Visit Note   Patient: [name]           Date of Birth: 1955/12/17           MRN: [id number] Visit Date: 01/28/2021              Requested by: Donita Brooks, MD [address] [place],  [place] [address] PCP: Donita Brooks, MD   Assessment & Plan: Visit Diagnoses:  1. Trigger finger, left index finger   2. Trigger finger, right ring finger     Plan: We discussed the diagnosis, prognosis, non-operative and operative treatment options for trigger finger.  After our discussion, the patient would like to proceed with corticosteroid injection.  We reviewed the risks and benefits of conservative management.  The patient expressed understanding of the reasoning and strategy going forward.  All patient questions and concerns were addressed.    Follow-Up Instructions: No follow-ups on file.   Orders:  No orders of the defined types were placed in this encounter.  No orders of the defined types were placed in this encounter.     Procedures: Hand/UE Inj: L index A1 for trigger finger on 01/28/2021 11:14 AM Indications: tendon swelling and pain Details: 25 G needle, volar approach Medications: 1 mL lidocaine 1 %; 6 mg betamethasone acetate-betamethasone sodium phosphate 6 (3-3) MG/ML Outcome: tolerated well, no immediate complications Consent was given by the patient. Patient was prepped and draped in the usual sterile fashion.    Hand/UE Inj: R ring A1 for trigger finger on 01/28/2021 11:15 AM Indications: pain and tendon swelling Details: 25 G needle, volar approach Medications: 1 mL lidocaine 1 %; 6 mg betamethasone acetate-betamethasone sodium phosphate 6 (3-3) MG/ML Outcome: tolerated well, no immediate complications Procedure, treatment alternatives, risks and benefits explained, specific risks discussed. Consent was given by the patient. Patient was prepped and draped in the usual sterile fashion.      Clinical Data: No additional  findings.   Subjective: Chief Complaint  Patient presents with   Left Hand - Pain    This is a 65 year old right-hand-dominant female who presents with pain at the left index and right ring fingers.  Her index finger started first about 3 months ago.  She notes that they are aching and they feel "tight".  She notes that she will often flex her finger and it will get stuck in this position requiring her to use her other hand to open the index finger again.  The pain is mostly over the MP joint and A1 pulley.  She is worn a brace for this.  She is never had any other treatment.  She notes that she had similar symptoms more recently involving the right ring finger.  Does not quite get stuck but does get painful with making a fist and full flexion.   Review of Systems   Objective: Vital Signs: There were no vitals taken for this visit.  Physical Exam Constitutional:      Appearance: Normal appearance.  Cardiovascular:     Rate and Rhythm: Normal rate.     Pulses: Normal pulses.  Pulmonary:     Effort: Pulmonary effort is normal.  Skin:    General: Skin is warm and dry.     Capillary Refill: Capillary refill takes less than 2 seconds.  Neurological:     Mental Status: She is alert.    Right Hand Exam   Tenderness  Right hand tenderness location: TTP over A1  pulley w/ moderate swelling.  Other  Erythema: absent Sensation: normal Pulse: present  Comments:  Pain with terminal flexion of index finger.  No palpable triggering today.  TTP over A1 pulley with fullness.   Left Hand Exam   Tenderness  Left hand tenderness location: TTP at ring finger A1 pulley.   Other  Erythema: absent Sensation: normal Pulse: present  Comments:  Palpable nodule at ring finger A1 pulley with early triggering with ROM.      Specialty Comments:  No specialty comments available.  Imaging: No results found.   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 [place] 2011: Diffuse distal and branch vessel disease - patient managed medically, no interventional options. b. [place]uc 03/2014 - low risk, no ischemia.   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 Severe stenosis mid right SFA s/p atherectomy 03/25/09. b. peripheral angiography in 12/2011 which showed only about 50% diffuse right SFA stenosis.   Pancreatitis 2003   Renal artery stenosis ([place])    a. [place] 2011 - 40-50% left RAS.   Tobacco use disorder    quit 11/10    Family History  Problem Relation Age of Onset   CVA Mother    Emphysema Father    Breast cancer Sister 71   Colon cancer Neg Hx     Past Surgical History:  Procedure Laterality Date   ABDOMINAL AORTAGRAM N/A 12/21/2011   Procedure: ABDOMINAL AORTAGRAM;  Surgeon: Wellington Hampshire, MD;  Location: [facility];  Service: Cardiovascular;  Laterality: N/A;   ABDOMINAL AORTOGRAM W/LOWER EXTREMITY Bilateral 05/08/2019   Procedure: ABDOMINAL AORTOGRAM W/LOWER EXTREMITY;  Surgeon: Wellington Hampshire, MD;  Location: [facility];  Service: Cardiovascular;  Laterality: Bilateral;   CARDIAC CATHETERIZATION     ENDARTERECTOMY FEMORAL Right 10/10/2019   Procedure: RIGHT FEMORAL ENDARTERECTOMY;  Surgeon: Marty Heck, MD;  Location: [facility];  Service: Vascular;  Laterality: Right;   ESOPHAGOGASTRODUODENOSCOPY (EGD) WITH PROPOFOL N/A 10/05/2017   Procedure: ESOPHAGOGASTRODUODENOSCOPY (EGD) WITH PROPOFOL;  Surgeon: Milus Banister, MD;  [facility];  Service: Endoscopy;  Laterality: N/A;   EUS N/A 10/05/2017  Procedure: UPPER ENDOSCOPIC ULTRASOUND (EUS) RADIAL;  Surgeon: Milus Banister, MD;  [facility];  Service: Endoscopy;  Laterality: N/A;   FOOT SURGERY Left    LOWER EXTREMITY ANGIOGRAM Bilateral 05/27/2015   Procedure: Lower Extremity Angiogram;  Surgeon: Wellington Hampshire, MD;  Location: [facility];  Service: Cardiovascular;  Laterality: Bilateral;   PATCH ANGIOPLASTY Right 10/10/2019   Procedure: PATCH ANGIOPLASTY USING Rueben Bash BIOLOGIC PATCH;  Surgeon: Marty Heck, MD;  Location: [facility];  Service: Vascular;  Laterality: Right;   PERIPHERAL VASCULAR ATHERECTOMY Left 05/08/2019   Procedure: PERIPHERAL VASCULAR ATHERECTOMY;  Surgeon: Wellington Hampshire, MD;  Location: [facility];  Service: Cardiovascular;  Laterality: Left;   PERIPHERAL VASCULAR CATHETERIZATION N/A 05/27/2015   Procedure: Abdominal Aortogram;  Surgeon: Wellington Hampshire, MD;  Location: [facility];  Service: Cardiovascular;  Laterality: N/A;   PERIPHERAL VASCULAR CATHETERIZATION Right 05/27/2015   Procedure: Peripheral Vascular Intervention;  Surgeon: Wellington Hampshire, MD;  Location: [facility];  Service: Cardiovascular;  Laterality: Right;  SFA    TOTAL KNEE ARTHROPLASTY Right 2011   TOTAL KNEE ARTHROPLASTY Left 05/22/2014   Procedure: LEFT TOTAL KNEE ARTHROPLASTY;  Surgeon: Latanya Maudlin, MD;  [facility];  Service: Orthopedics;  Laterality: Left;   TUBAL LIGATION  1980   Social History   Occupational History   Not on file  Tobacco Use   Smoking status: Former    Types: Cigarettes    Quit date: 12/13/2008    Years since quitting: 12.1   Smokeless tobacco: Never  Vaping Use   Vaping Use: Never used  Substance and Sexual Activity   Alcohol use: No    Comment: Quit drinking around year 2000   Drug use: No    Comment: Patient quit smoking around 2011   Sexual activity: Yes    Partners: Male

## 2021-02-16 ENCOUNTER — Other Ambulatory Visit: Payer: Self-pay | Admitting: Family Medicine

## 2021-02-16 ENCOUNTER — Telehealth: Payer: Self-pay | Admitting: Family Medicine

## 2021-02-16 DIAGNOSIS — M79641 Pain in right hand: Secondary | ICD-10-CM

## 2021-02-16 NOTE — Telephone Encounter (Signed)
LM to call back to office

## 2021-02-16 NOTE — Telephone Encounter (Signed)
Pt called back in stating that she missed a phone call about getting a possible referral to a hand dr.. Pt was unsure of if she needed to make an appt to get the referral. Please advise  Cb#: [phone]

## 2021-02-16 NOTE — Telephone Encounter (Signed)
Patient fell Christmas day and reinjured left hand. Currently seeing hand specialist; unsure if appointment needed by pcp since same hand injured. Please advise at [phone].

## 2021-02-16 NOTE — Telephone Encounter (Signed)
Pt called - since she already had the appt there - I advised her to call hand specialist and ask if referral is required - if so, she will call us back.

## 2021-02-16 NOTE — Telephone Encounter (Signed)
Patient called hand specialist to ask if new referral needed for evaluation of new injury; reinjured same hand (right). Hand specialist (Dr. Alinda Money) will get back to patient with answer; patient requesting referral from pcp to expedite process. Scheduled for appt with hand specialist on 1/26 for follow up appointment of original injury; wants to be seen sooner to address new injury. Hand aching all the time. Patient unable to grip/grab.  Please advise at [phone].

## 2021-02-18 ENCOUNTER — Encounter: Payer: Self-pay | Admitting: Emergency Medicine

## 2021-02-18 ENCOUNTER — Ambulatory Visit
Admission: EM | Admit: 2021-02-18 | Discharge: 2021-02-18 | Disposition: A | Discharge status: Home / Self Care | Payer: 59 | Attending: Family Medicine | Admitting: Family Medicine

## 2021-02-18 ENCOUNTER — Ambulatory Visit (INDEPENDENT_AMBULATORY_CARE_PROVIDER_SITE_OTHER): Payer: 59

## 2021-02-18 ENCOUNTER — Other Ambulatory Visit: Payer: Self-pay

## 2021-02-18 DIAGNOSIS — M25532 Pain in left wrist: Secondary | ICD-10-CM

## 2021-02-18 DIAGNOSIS — M79642 Pain in left hand: Secondary | ICD-10-CM

## 2021-02-18 MED ORDER — DEXAMETHASONE SODIUM PHOSPHATE 10 MG/ML IJ SOLN
10.0000 mg | Freq: Once | INTRAMUSCULAR | Status: AC
  Administered 2021-02-18: 10 mg via INTRAMUSCULAR

## 2021-02-18 NOTE — Discharge Instructions (Signed)
Follow-up with your orthopedist if your symptoms or not improving.  Watch your blood sugars closely for the next week or so after the steroid shot

## 2021-02-18 NOTE — ED Triage Notes (Signed)
Pt fell on 12/24 and landed on her left hand. Pt states its difficult to grab items.

## 2021-02-18 NOTE — ED Provider Notes (Signed)
[facility]    CSN: [id number] Arrival date & time: 02/18/21  1132      History   Chief Complaint Chief Complaint  Patient presents with   Hand Injury   HPI [name] is a 66 y.o. female.   Presenting today with left proximal hand pain following a fall that occurred on 02/06/2021 onto the hand.  She states it is difficult to grab items with this hand due to pain and she has had some off-and-on bruising and swelling to the area.  Has been using her hand brace which does not seem to be providing any benefit.  States she is unable to tolerate over-the-counter pain relievers so has not taken any.  Denies numbness, tingling, decreased range of motion.    Past Medical History:  Diagnosis Date   Aortic atherosclerosis ([place])    Asthma with COPD ([place])    PATIENT DENIES   CAD (coronary artery disease)    a. [place] 2011: Diffuse distal and branch vessel disease - patient managed medically, no interventional options. b. [place]uc 03/2014 - low risk, no ischemia.   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 Severe stenosis mid right SFA s/p atherectomy 03/25/09. b. peripheral angiography in 12/2011 which showed only about 50% diffuse right SFA stenosis.   Pancreatitis 2003   Renal artery stenosis ([place])    a. [place] 2011 - 40-50% left RAS.   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[place])    Dyslipidemia    History of bilateral knee replacement 05/22/2014   Cutaneous skin tags 09/09/2012   Bilateral leg pain 07/20/2012   Arthritis of knee 10/25/2011   GERD (gastroesophageal reflux disease) 11/04/2010   PAD (peripheral artery disease) ([place]) 01/30/2009   Diabetes mellitus, type II ([place]) 01/02/2009   TOBACCO ABUSE 01/02/2009   Essential hypertension 01/02/2009    Past Surgical History:  Procedure Laterality Date   ABDOMINAL AORTAGRAM N/A 12/21/2011   Procedure: ABDOMINAL Maxcine [place];  Surgeon: Wellington Hampshire, MD;  Location: [facility];  Service: Cardiovascular;  Laterality: N/A;   ABDOMINAL AORTOGRAM W/LOWER EXTREMITY Bilateral 05/08/2019   Procedure: ABDOMINAL AORTOGRAM W/LOWER EXTREMITY;  Surgeon: Wellington Hampshire, MD;  Location: [facility];  Service: Cardiovascular;  Laterality: Bilateral;   CARDIAC CATHETERIZATION     ENDARTERECTOMY FEMORAL Right 10/10/2019   Procedure: RIGHT FEMORAL ENDARTERECTOMY;  Surgeon: Marty Heck, MD;  Location: [facility];  Service: Vascular;  Laterality: Right;   ESOPHAGOGASTRODUODENOSCOPY (EGD) WITH PROPOFOL N/A 10/05/2017   Procedure: ESOPHAGOGASTRODUODENOSCOPY (EGD) WITH PROPOFOL;  Surgeon: Milus Banister, MD;  [facility];  Service: Endoscopy;  Laterality: N/A;   EUS N/A 10/05/2017  Procedure: UPPER ENDOSCOPIC ULTRASOUND (EUS) RADIAL;  Surgeon: Milus Banister, MD;  [facility];  Service: Endoscopy;  Laterality: N/A;   FOOT SURGERY Left    LOWER EXTREMITY ANGIOGRAM Bilateral 05/27/2015   Procedure: Lower Extremity Angiogram;  Surgeon: Wellington Hampshire, MD;  Location: [facility];  Service: Cardiovascular;  Laterality: Bilateral;   PATCH ANGIOPLASTY Right 10/10/2019   Procedure: PATCH ANGIOPLASTY USING Rueben Bash BIOLOGIC PATCH;  Surgeon: Marty Heck, MD;  Location: [facility];  Service: Vascular;  Laterality: Right;    PERIPHERAL VASCULAR ATHERECTOMY Left 05/08/2019   Procedure: PERIPHERAL VASCULAR ATHERECTOMY;  Surgeon: Wellington Hampshire, MD;  Location: [facility];  Service: Cardiovascular;  Laterality: Left;   PERIPHERAL VASCULAR CATHETERIZATION N/A 05/27/2015   Procedure: Abdominal Aortogram;  Surgeon: Wellington Hampshire, MD;  Location: [facility];  Service: Cardiovascular;  Laterality: N/A;   PERIPHERAL VASCULAR CATHETERIZATION Right 05/27/2015   Procedure: Peripheral Vascular Intervention;  Surgeon: Wellington Hampshire, MD;  Location: [facility];  Service: Cardiovascular;  Laterality: Right;  SFA   TOTAL KNEE ARTHROPLASTY Right 2011   TOTAL KNEE ARTHROPLASTY Left 05/22/2014   Procedure: LEFT TOTAL KNEE ARTHROPLASTY;  Surgeon: Latanya Maudlin, MD;  [facility];  Service: Orthopedics;  Laterality: Left;   TUBAL LIGATION  1980    OB History   No obstetric history on file.      Home Medications    Prior to Admission medications   Medication Sig Start Date End Date Taking? Authorizing Provider  acetaminophen (TYLENOL) 500 MG tablet Take 1,000 mg by mouth every 6 (six) hours as needed for moderate pain or headache.    [provider]  albuterol (VENTOLIN HFA) 108 (90 Base) MCG/ACT inhaler INHALE 2 PUFFS INTO THE LUNGS EVERY 6 HOURS AS NEEDED FOR WHEEZING OR SHORTNESS OF BREATH 11/09/20   Susy Frizzle, MD  amitriptyline (ELAVIL) 50 MG tablet TAKE 1 TABLET(50 MG) BY MOUTH AT BEDTIME 01/21/21   Susy Frizzle, MD  Biotin w/ Vitamins C & E (HAIR/SKIN/NAILS PO) Take 1 tablet by mouth daily.    [provider]  bisoprolol (ZEBETA) 5 MG tablet Take 0.5 tablets (2.5 mg total) by mouth daily. 06/04/20   Susy Frizzle, MD  clopidogrel (PLAVIX) 75 MG tablet Take 1 tablet (75 mg total) by mouth daily. 04/03/20   Susy Frizzle, MD  diclofenac sodium (VOLTAREN) 1 % GEL Apply 2 g topically 4 (four) times daily. Patient taking differently: Apply 2 g topically 4 (four)  times daily as needed (for hand pain). 05/04/16   Susy Frizzle, MD  fluticasone-salmeterol (ADVAIR DISKUS) 250-50 MCG/ACT AEPB Inhale 1 puff into the lungs in the morning and at bedtime. 08/03/20   Susy Frizzle, MD  HYDROcodone-acetaminophen (NORCO/VICODIN) 5-325 MG tablet Take 1 tablet by mouth every 6 (six) hours as needed for moderate pain. Chronic Pain. Dx: G89.4 01/21/21   Susy Frizzle, MD  isosorbide mononitrate (IMDUR) 60 MG 24 hr tablet Take 1 tablet (60 mg total) by mouth daily. 06/26/20   Susy Frizzle, MD  lipase/protease/amylase (CREON) 36000 UNITS CPEP capsule Take 2 capsules (72,000 Units total) by mouth 3 (three) times daily with meals. May also take 1 capsule (36,000 Units total) as needed (with snacks). 06/16/20   Susy Frizzle, MD  metFORMIN (GLUCOPHAGE) 1000 MG tablet TAKE 1 TABLET BY MOUTH TWICE DAILY WITH MEALS 01/25/21   Susy Frizzle, MD  methocarbamol (ROBAXIN) 500 MG tablet  TAKE 1 TABLET BY MOUTH EVERY 6 HOURS AS NEEDED FOR MUSCLE SPASM 12/09/20   Susy Frizzle, MD  Multiple Vitamin (MULTIVITAMIN) tablet Take 1 tablet by mouth daily.    [provider]  nitroGLYCERIN (NITROSTAT) 0.4 MG SL tablet Place 1 tablet under the tongue every 5 (five) minutes as needed for chest pain. 09/26/18   [provider]  ondansetron (ZOFRAN) 4 MG tablet Take 1 tablet (4 mg total) by mouth every 8 (eight) hours as needed for nausea or vomiting. 10/27/20   Susy Frizzle, MD  pantoprazole (PROTONIX) 40 MG tablet Take 1 tablet (40 mg total) by mouth 2 (two) times daily before a meal. 01/11/21   Susy Frizzle, MD  pioglitazone (ACTOS) 30 MG tablet TAKE 1 TABLET(30 MG) BY MOUTH DAILY 12/23/20   Susy Frizzle, MD  pregabalin (LYRICA) 50 MG capsule Take 1 capsule (50 mg total) by mouth 3 (three) times daily. 01/11/21   Susy Frizzle, MD  Probiotic Product (TRUBIOTICS PO) Take 1 capsule by mouth daily.    [provider]  rosuvastatin  (CRESTOR) 40 MG tablet TAKE 1 TABLET(40 MG) BY MOUTH EVERY NIGHT 01/14/21   Susy Frizzle, MD  sitaGLIPtin (JANUVIA) 100 MG tablet Take 1 tablet (100 mg total) by mouth daily. 01/21/21   Susy Frizzle, MD  vitamin B-12 (CYANOCOBALAMIN) 1000 MCG tablet Take 1,000 mcg by mouth daily.    [provider]    Family History Family History  Problem Relation Age of Onset   CVA Mother    Emphysema Father    Breast cancer Sister 24   Colon cancer Neg Hx     Social History Social History   Tobacco Use   Smoking status: Former    Types: Cigarettes    Quit date: 12/13/2008    Years since quitting: 12.1   Smokeless tobacco: Never  Vaping Use   Vaping Use: Never used  Substance Use Topics   Alcohol use: No    Comment: Quit drinking around year 2000   Drug use: No    Comment: Patient quit smoking around 2011     Allergies   Gabapentin, Glucotrol [glipizide], Influenza vaccines, Latex, Nortriptyline, Tetanus toxoid, Other, and Invokana [canagliflozin]   Review of Systems Review of Systems Per HPI  Physical Exam Triage Vital Signs ED Triage Vitals  Enc Vitals Group     BP 02/18/21 1312 128/67     Pulse Rate 02/18/21 1312 85     Resp 02/18/21 1312 16     Temp 02/18/21 1312 97.8 F (36.6 C)     Temp Source 02/18/21 1312 Oral     SpO2 02/18/21 1312 97 %     Weight --      Height --      Head Circumference --      Peak Flow --      Pain Score 02/18/21 1311 8     Pain Loc --      Pain Edu? --      Excl. in [place]? --    No data found.  Updated Vital Signs BP 128/67 (BP Location: Right Arm)    Pulse 85    Temp 97.8 F (36.6 C) (Oral)    Resp 16    SpO2 97%   Visual Acuity Right Eye Distance:   Left Eye Distance:   Bilateral Distance:    Right Eye Near:   Left Eye Near:    Bilateral Near:  Physical Exam Vitals and nursing note reviewed.  Constitutional:      Appearance: Normal appearance. She is not ill-appearing.  HENT:     Head: Atraumatic.   Eyes:     Extraocular Movements: Extraocular movements intact.     Conjunctiva/sclera: Conjunctivae normal.  Cardiovascular:     Rate and Rhythm: Normal rate and regular rhythm.     Heart sounds: Normal heart sounds.  Pulmonary:     Effort: Pulmonary effort is normal.     Breath sounds: Normal breath sounds.  Musculoskeletal:        General: Tenderness and signs of injury present. No swelling or deformity. Normal range of motion.     Cervical back: Normal range of motion and neck supple.     Comments: Diffuse tenderness to palpation proximal left hand.  Range of motion full and intact of left hand and wrist.  No deformities palpable.  Skin:    General: Skin is warm and dry.     Findings: No bruising.  Neurological:     Mental Status: She is alert and oriented to person, place, and time.     Comments: Left hand neurovascularly intact  Psychiatric:        Mood and Affect: Mood normal.        Thought Content: Thought content normal.        Judgment: Judgment normal.     UC Treatments / Results  Labs (all labs ordered are listed, but only abnormal results are displayed) Labs Reviewed - No data to display  EKG   Radiology DG Hand Complete Left  Result Date: 02/18/2021 CLINICAL DATA:  Hand injury EXAM: LEFT HAND - COMPLETE 3+ VIEW COMPARISON:  None. FINDINGS: No acute fracture or dislocation identified. Bones are osteopenic. Mild arthritic changes. No significant soft tissue swelling visualized. IMPRESSION: No acute osseous abnormality visualized. Electronically Signed   By: Ofilia Neas M.D.   On: 02/18/2021 13:29    Procedures Procedures (including critical care time)  Medications Ordered in UC Medications  dexamethasone (DECADRON) injection 10 mg (10 mg Intramuscular Given 02/18/21 1356)    Initial Impression / Assessment and Plan / UC Course  I have reviewed the triage vital signs and the nursing notes.  Pertinent labs & imaging results that were available during  my care of the patient were reviewed by me and considered in my medical decision making (see chart for details).     X-ray left hand negative for acute bony abnormality, and she states she is intolerant to over-the-counter pain relievers so we will cautiously give IM Decadron to see if this helps with pain and inflammation at the site.  RICE protocol and orthopedic follow-up recommended if not fully resolving.  Final Clinical Impressions(s) / UC Diagnoses   Final diagnoses:  Left hand pain  Left wrist pain     Discharge Instructions      Follow-up with your orthopedist if your symptoms or not improving.  Watch your blood sugars closely for the next week or so after the steroid shot    ED Prescriptions   None    PDMP not reviewed this encounter.   Volney American, [place] 02/18/21 1406

## 2021-02-22 ENCOUNTER — Telehealth: Payer: Self-pay

## 2021-02-22 MED ORDER — HYDROCODONE-ACETAMINOPHEN 5-325 MG PO TABS: 1.0000 | ORAL_TABLET | Freq: Four times a day (QID) | ORAL | 0 refills | Status: DC | PRN

## 2021-02-22 NOTE — Telephone Encounter (Signed)
Pt called in requesting a refill of HYDROcodone-acetaminophen (NORCO/VICODIN) 5-325 MG tablet .   Cb#: [phone] 

## 2021-02-23 ENCOUNTER — Ambulatory Visit (INDEPENDENT_AMBULATORY_CARE_PROVIDER_SITE_OTHER): Payer: 59 | Admitting: Orthopedic Surgery

## 2021-02-23 ENCOUNTER — Other Ambulatory Visit: Payer: Self-pay

## 2021-02-23 DIAGNOSIS — M65341 Trigger finger, right ring finger: Secondary | ICD-10-CM

## 2021-02-23 DIAGNOSIS — M65322 Trigger finger, left index finger: Secondary | ICD-10-CM

## 2021-02-23 DIAGNOSIS — M79642 Pain in left hand: Secondary | ICD-10-CM | POA: Diagnosis not present

## 2021-02-23 NOTE — Progress Notes (Signed)
Office Visit Note   Patient: [name]           Date of Birth: Nov 13, 1955           MRN: [id number] Visit Date: 02/23/2021              Requested by: Susy Frizzle, MD [address] [place],  [place] [address] PCP: Susy Frizzle, MD   Assessment & Plan: Visit Diagnoses:  1. Trigger finger, left index finger   2. Trigger finger, right ring finger   3. Pain in left hand     Plan: Patient notes significant improvement of her right ring finger triggering with more modest improvement of the left index finger.  The pain at the base of her thumb and the thenar eminence seems to be related to soft tissue contusion from her GLF on Christmas day.  She does have some degenerative changes at the thumb [facility] joint so could also be related to arthritis flare.  Because her symptoms are improving, she wants to come back in another month for a follow up visit.   Follow-Up Instructions: No follow-ups on file.   Orders:  No orders of the defined types were placed in this encounter.  No orders of the defined types were placed in this encounter.     Procedures: No procedures performed   Clinical Data: No additional findings.   Subjective: Chief Complaint  Patient presents with   Right Hand - Follow-up    Had trigger finger ring injections, states that she fall on Christmas day and hurt her hands, went last week and had them x-rayed   Left Hand - Follow-up    Trigger finger injection, index    This is a 66 year old right-hand-dominant female who presents with pain at the left thumb CMC joint and thenar eminence after a GLF on Christmas day.  She was seen on 12/15 at which point she underwent CSI in to the left index and right ring finger A1 pulley for trigger finger.  Her triggering has improved.  The pain at the base of her thumb is also improving since her fall.  She has near normal ROM at this point but has some difficulty maintaining a tight grip on certain  objects.  She has been using voltaren gel as she cannot tolerate NSAIDs.    Review of Systems   Objective: Vital Signs: There were no vitals taken for this visit.  Physical Exam  Right Hand Exam   Tenderness  The patient is experiencing no tenderness.   Comments:  No TTP at ring A1 pulley with full ROM and no locking, catching, triggering.    Left Hand Exam   Tenderness  Left hand tenderness location: TTP at base of thumb at [facility] joint and distal pole scaphoid/thenar eminence.   Range of Motion  The patient has normal left wrist ROM.  Other  Erythema: absent Sensation: normal Pulse: present  Comments:  Full ROM of thumb and fingers.  No TTP at index A1 pulley and no locking, catching, triggering.  Pain w/ CMC grind test.       Specialty Comments:  No specialty comments available.  Imaging: Multiple views of the left hand from 02/18/21 reviewed and interpreted by me.  There is diffuse osteopenia.  She has scattered degenerative changes including at the thumb [facility] joint.  There are no obvious fracture or bony injury.    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 [place] 2011: Diffuse distal and branch vessel disease - patient managed medically, no interventional options. b. [place] 03/2014 - low risk, no ischemia.   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 Severe stenosis mid right SFA s/p atherectomy 03/25/09. b. peripheral angiography in 12/2011 which showed only about 50% diffuse right SFA stenosis.   Pancreatitis 2003   Renal artery stenosis ([place])    a. [place] 2011 - 40-50% left RAS.   Tobacco use disorder    quit 11/10    Family History  Problem Relation Age of Onset   CVA Mother    Emphysema Father    Breast cancer Sister 63   Colon cancer Neg Hx     Past Surgical History:  Procedure Laterality Date   ABDOMINAL AORTAGRAM N/A 12/21/2011   Procedure: ABDOMINAL AORTAGRAM;  Surgeon: Wellington Hampshire, MD;  Location: [place] CATH LAB;  Service: Cardiovascular;  Laterality: N/A;   ABDOMINAL AORTOGRAM W/LOWER EXTREMITY Bilateral 05/08/2019   Procedure: ABDOMINAL AORTOGRAM W/LOWER EXTREMITY;  Surgeon: Wellington Hampshire, MD;  Location: [place] CV LAB;  Service: Cardiovascular;  Laterality: Bilateral;   CARDIAC CATHETERIZATION     ENDARTERECTOMY FEMORAL Right 10/10/2019   Procedure: RIGHT FEMORAL ENDARTERECTOMY;  Surgeon: Marty Heck, MD;  Location: [place];  Service: Vascular;  Laterality: Right;   ESOPHAGOGASTRODUODENOSCOPY (EGD) WITH PROPOFOL N/A 10/05/2017   Procedure: ESOPHAGOGASTRODUODENOSCOPY (EGD) WITH PROPOFOL;  Surgeon: Milus Banister, MD;  Location: [facility];  Service: Endoscopy;  Laterality: N/A;   EUS N/A 10/05/2017   Procedure:  UPPER ENDOSCOPIC ULTRASOUND (EUS) RADIAL;  Surgeon: Milus Banister, MD;  Location: [facility];  Service: Endoscopy;  Laterality: N/A;   FOOT SURGERY Left    LOWER EXTREMITY ANGIOGRAM Bilateral 05/27/2015   Procedure: Lower Extremity Angiogram;  Surgeon: Wellington Hampshire, MD;  Location: [place] CV LAB;  Service: Cardiovascular;  Laterality: Bilateral;   PATCH ANGIOPLASTY Right 10/10/2019   Procedure: PATCH ANGIOPLASTY USING Rueben Bash BIOLOGIC PATCH;  Surgeon: Marty Heck, MD;  Location: [facility] OR;  Service: Vascular;  Laterality: Right;   PERIPHERAL VASCULAR ATHERECTOMY Left 05/08/2019  Procedure: PERIPHERAL VASCULAR ATHERECTOMY;  Surgeon: Wellington Hampshire, MD;  Location: [place] CV LAB;  Service: Cardiovascular;  Laterality: Left;   PERIPHERAL VASCULAR CATHETERIZATION N/A 05/27/2015   Procedure: Abdominal Aortogram;  Surgeon: Wellington Hampshire, MD;  Location: [place] CV LAB;  Service: Cardiovascular;  Laterality: N/A;   PERIPHERAL VASCULAR CATHETERIZATION Right 05/27/2015   Procedure: Peripheral Vascular Intervention;  Surgeon: Wellington Hampshire, MD;  Location: [place] CV LAB;  Service: Cardiovascular;  Laterality: Right;  SFA   TOTAL KNEE ARTHROPLASTY Right 2011   TOTAL KNEE ARTHROPLASTY Left 05/22/2014   Procedure: LEFT TOTAL KNEE ARTHROPLASTY;  Surgeon: Latanya Maudlin, MD;  [facility];  Service: Orthopedics;  Laterality: Left;   TUBAL LIGATION  1980   Social History   Occupational History   Not on file  Tobacco Use   Smoking status: Former    Types: Cigarettes    Quit date: 12/13/2008    Years since quitting: 12.2   Smokeless tobacco: Never  Vaping Use   Vaping Use: Never used  Substance and Sexual Activity   Alcohol use: No    Comment: Quit drinking around year 2000   Drug use: No    Comment: Patient quit smoking around 2011   Sexual activity: Yes    Partners: Male

## 2021-02-25 ENCOUNTER — Telehealth: Payer: Self-pay

## 2021-02-25 NOTE — Telephone Encounter (Signed)
Pt called in stating that she has a new insurance, and would need to get a prior authorization for this med sent HYDROcodone-acetaminophen (NORCO/VICODIN) 5-325 MG tablet . Please advise.  Cb#: [phone]

## 2021-02-26 NOTE — Progress Notes (Signed)
(  Key: [facility] has not yet replied to your PA request. You may close this dialog, return to your dashboard, and perform other tasks.  To check for an update later, open this request again from your dashboard.  If [facility] has not replied within 72 hours for urgent requests and up to 15 days for standard requests, please contact [facility] at [phone].

## 2021-02-26 NOTE — Telephone Encounter (Signed)
(  Key: [id number])  This request has received a Favorable outcome.  Please note any additional information provided by [facility] at the bottom of this request.  Patient aware.

## 2021-02-28 ENCOUNTER — Other Ambulatory Visit: Payer: Self-pay | Admitting: Family Medicine

## 2021-03-11 ENCOUNTER — Ambulatory Visit: Payer: 59 | Admitting: Orthopedic Surgery

## 2021-03-22 ENCOUNTER — Other Ambulatory Visit: Payer: Self-pay | Admitting: Family Medicine

## 2021-03-22 NOTE — Telephone Encounter (Signed)
Patient called to request refill of  HYDROcodone-acetaminophen (NORCO/VICODIN) 5-325 MG tablet [[id number]  Also needs Contour Next EX test strips (has new insurance).  Pharmacy confirmed as  [facility] #[id number] [place], [place] - [address] AT [facility] OF [address] [place] [address]  Phone:  [phone]  Fax:  [phone]  DEA #:  [id number]  Please advise at [phone].

## 2021-03-23 ENCOUNTER — Ambulatory Visit: Payer: 59 | Admitting: Orthopedic Surgery

## 2021-03-23 MED ORDER — HYDROCODONE-ACETAMINOPHEN 5-325 MG PO TABS: 1.0000 | ORAL_TABLET | Freq: Four times a day (QID) | ORAL | 0 refills | Status: DC | PRN

## 2021-03-23 NOTE — Telephone Encounter (Signed)
LOV 01/11/21 Last refill 02/22/21, #130, 0 refills  Please review, thanks!

## 2021-03-30 ENCOUNTER — Telehealth: Payer: Self-pay

## 2021-03-30 NOTE — Telephone Encounter (Signed)
Pt called in asking if pcp could change the way that her prescription for HYDROcodone-acetaminophen (NORCO/VICODIN) 5-325. Pt states that pharmacy instructed pt to ask if pcp could word the prescription to accommodate pt taking one more pill a day, so that the amount of pills would still be for 130 per refill? Please advise.  Cb#: [phone]

## 2021-03-30 NOTE — Telephone Encounter (Signed)
ATC pt to get more information, she was not home per the person that answered phone.  Spoke with pharmacist at [facility], he states [employer] only allows #120/30D as rx is written. Pt is asking to keep #130 and wants rx to be changed to reflect this. This change would mean rx would need be Q4H PRN, which would actually increase to #180 per month. There is no way to write it to keep pt at #130 per month, as that is not an even number of tablets per day.   Please advise, thanks!

## 2021-03-31 NOTE — Telephone Encounter (Signed)
Patient advised current rx will not be changed, she will receive #120 per month going forward. Pt voiced understanding. Nothing further needed at this time.

## 2021-04-05 ENCOUNTER — Telehealth: Payer: Self-pay | Admitting: Family Medicine

## 2021-04-05 DIAGNOSIS — E1159 Type 2 diabetes mellitus with other circulatory complications: Secondary | ICD-10-CM

## 2021-04-05 MED ORDER — CONTOUR NEXT TEST VI STRP: ORAL_STRIP | 12 refills | Status: DC

## 2021-04-05 NOTE — Telephone Encounter (Signed)
Patient requesting refill of test strips (Contour Next); needed for new machine (Contour Next Easy).    Pharmacy confirmed as  [facility] #[id number] [place], [place] - [address] AT [facility] OF [address] [place] [address]  Phone:  [phone]  Fax:  [phone]  DEA #:  [id number]  Please advise at [phone].

## 2021-04-05 NOTE — Telephone Encounter (Signed)
Rx sent as requested.

## 2021-04-06 NOTE — Telephone Encounter (Signed)
Patient called to follow up on correspondence from pharmacy about test strip refill request; frequency of testing needed for insurance to pay.  Please advise at [phone].

## 2021-04-07 ENCOUNTER — Other Ambulatory Visit: Payer: Self-pay

## 2021-04-07 DIAGNOSIS — E1159 Type 2 diabetes mellitus with other circulatory complications: Secondary | ICD-10-CM

## 2021-04-07 MED ORDER — CONTOUR NEXT TEST VI STRP: 1.0000 | ORAL_STRIP | Freq: Three times a day (TID) | 12 refills | Status: DC

## 2021-04-08 NOTE — Telephone Encounter (Signed)
Rx resent to pharmacy

## 2021-04-13 ENCOUNTER — Other Ambulatory Visit: Payer: Self-pay | Admitting: Family Medicine

## 2021-04-21 ENCOUNTER — Other Ambulatory Visit: Payer: Self-pay | Admitting: Family Medicine

## 2021-04-21 DIAGNOSIS — K861 Other chronic pancreatitis: Secondary | ICD-10-CM

## 2021-04-21 MED ORDER — ONDANSETRON HCL 4 MG PO TABS: 4.0000 mg | ORAL_TABLET | Freq: Three times a day (TID) | ORAL | 1 refills | Status: DC | PRN

## 2021-04-21 NOTE — Telephone Encounter (Signed)
Clarification provided on test strips. ?Odansetron sent in.  ? ? ? ?LOV 01/11/21 ?Last refill 03/23/21, #130, 0 refills ? ?Please review, thanks! ?

## 2021-04-21 NOTE — Telephone Encounter (Signed)
Patient called to follow up on refill request for ondansetron (ZOFRAN) 4 MG tablet ? ? ?Stated pharmacy told her prior auth needed. ? ?Also states pharmacy needs clarification of refill request for Contour next test strips; states pharmacy needs to know how often patient tests daily. ? ?Requesting refill of ? ?HYDROcodone-acetaminophen (NORCO/VICODIN) 5-325 MG tablet [[id number]]  ? ? ?Pharmacy confirmed as ? ?[facility] #[id number] - [place], [place] - [address] AT [facility] OF [address]  ?[address], [place]  [address]  ?Phone:  [phone]  Fax:  [phone]  ?DEA #:  [id number] ? ?Please advise at [phone] ?

## 2021-04-22 MED ORDER — HYDROCODONE-ACETAMINOPHEN 5-325 MG PO TABS: 1.0000 | ORAL_TABLET | Freq: Four times a day (QID) | ORAL | 0 refills | Status: DC | PRN

## 2021-04-28 ENCOUNTER — Telehealth: Payer: Self-pay

## 2021-04-28 NOTE — Telephone Encounter (Signed)
PA for Odansetron unable to be completed on CoverMyMeds ? ?Form completed and faxed with OV notes to [facility] ?[phone] ?

## 2021-05-10 ENCOUNTER — Telehealth: Payer: Self-pay | Admitting: Family Medicine

## 2021-05-10 NOTE — Telephone Encounter (Signed)
Received call from patient to follow up on prior auth for ondansetron (ZOFRAN) 4 MG tablet ? ?Patient states prior auth denied; she believes it's because the insurance only authorizes a refill for 21 days at a time. Also states insurance company suggested provider increase dose to 8mg . ? ?Insurance company will be sending another prior auth request. Patient requesting for provider to adjust quantity to suit amount covered by her insurance company. ? ?Please advise at [phone]. ?

## 2021-05-13 ENCOUNTER — Other Ambulatory Visit: Payer: Self-pay | Admitting: Family Medicine

## 2021-05-13 DIAGNOSIS — K861 Other chronic pancreatitis: Secondary | ICD-10-CM

## 2021-05-13 MED ORDER — ONDANSETRON HCL 4 MG PO TABS: 4.0000 mg | ORAL_TABLET | Freq: Three times a day (TID) | ORAL | 1 refills | Status: DC | PRN

## 2021-05-17 ENCOUNTER — Other Ambulatory Visit: Payer: Self-pay | Admitting: Family Medicine

## 2021-05-17 ENCOUNTER — Telehealth: Payer: Self-pay | Admitting: Family Medicine

## 2021-05-17 MED ORDER — PANCRELIPASE (LIP-PROT-AMYL) 36000-114000 UNITS PO CPEP: ORAL_CAPSULE | ORAL | 11 refills | Status: DC

## 2021-05-17 NOTE — Telephone Encounter (Signed)
Patient called to request refill of ? ?HYDROcodone-acetaminophen (NORCO/VICODIN) 5-325 MG tablet ? ? ?Pharmacy confirmed as ? ?[facility] #[id number] - [place], [place] - [address] AT [facility] OF [address]  ?[address],  [place] [address]  ?Phone:  [phone]  Fax:  [phone]  ?DEA #:  [id number] ? ?Please advise at [phone]. ?

## 2021-05-17 NOTE — Telephone Encounter (Signed)
Patient dropped off paperwork requiring provider's completion and signature; it's an application for CREON and LINZESS. Paperwork placed in hanging folder at [employer]. Patient requesting call when paperwork completed; she will pick it up. ? ?Please advise at [phone].  ?

## 2021-05-17 NOTE — Telephone Encounter (Signed)
Patient needs [place] to send a new prescription into the manufactory she is on an assistance program. Their fax number [phone].  ? ?lipase/protease/amylase (CREON) 36000 UNITS CPEP capsule [[id number]]  ?  Order Details ?Dose, Route, Frequency: As Directed  ?Dispense Quantity: 240 capsule Refills: 11   ?     ?Sig: Take 2 capsules (72,000 Units total) by mouth 3 (three) times daily with meals. May also take 1 capsule (36,000 Units total) as needed (with snacks).  ?She is also going to bring in paperwork from them to get certified again for this year.  ? ?CB# [phone] ?

## 2021-05-18 MED ORDER — HYDROCODONE-ACETAMINOPHEN 5-325 MG PO TABS: 1.0000 | ORAL_TABLET | Freq: Four times a day (QID) | ORAL | 0 refills | Status: DC | PRN

## 2021-05-18 NOTE — Telephone Encounter (Signed)
Given it to Dr.Pickard to sign 05/18/21 ?

## 2021-05-18 NOTE — Telephone Encounter (Signed)
LOV 01/11/21 ?Last refill 04/22/21, #130, 0 refills ? ?Please review, thanks! ? ?

## 2021-05-19 NOTE — Telephone Encounter (Signed)
Can you guys check to see if you have the form for this pt? ? ?thanks ?

## 2021-05-19 NOTE — Telephone Encounter (Signed)
Left message on patient's voicemail to advise form ready for pickup. Advised patient of variation in office hours of operation this week.  ?

## 2021-05-20 MED ORDER — PANCRELIPASE (LIP-PROT-AMYL) 36000-114000 UNITS PO CPEP: ORAL_CAPSULE | ORAL | 11 refills | Status: DC

## 2021-05-20 NOTE — Telephone Encounter (Signed)
RX ordered.

## 2021-05-20 NOTE — Addendum Note (Signed)
Addended by: Arta Silence on: 05/20/2021 05:07 PM ? ? Modules accepted: Orders ? ?

## 2021-05-20 NOTE — Addendum Note (Signed)
Addended by: Colman Cater on: 05/20/2021 05:05 PM ? ? Modules accepted: Orders ? ?

## 2021-05-20 NOTE — Telephone Encounter (Signed)
Yes, we had the form up front; she came to pick it up today. ? ?Thank you.  ?

## 2021-05-20 NOTE — Telephone Encounter (Signed)
Request for clinical documentation received 05/17/21. LOV notes printed and attached to form, form placed on provider's desk for signature.  ?

## 2021-05-24 NOTE — Telephone Encounter (Signed)
Patient called to follow up on refill for lipase/protease/amylase (CREON) 36000 UNITS CPEP capsule;  ? ?patient states Rx sent to [facility] but should've been sent to [facility], fax number [phone]. ? ?Please advise at [phone].  ?

## 2021-05-26 NOTE — Telephone Encounter (Signed)
This encounter was created in error - please disregard.

## 2021-05-26 NOTE — Telephone Encounter (Signed)
Spoke with pt re meds, per pt leave the refill at [facility] (05/20/21). When the form for the Ondansetron HCI, need to fax to [facility] per pt ?

## 2021-05-28 ENCOUNTER — Telehealth: Payer: Self-pay

## 2021-05-28 NOTE — Telephone Encounter (Signed)
Dr. Tanya Nones still has the form and hasn't signed it yet. Ill call pt once I received it back.  ?

## 2021-05-28 NOTE — Telephone Encounter (Signed)
Pt returning call to clinical staff inquiring about med ondansetron (ZOFRAN) 4 MG tablet. Please call pt back. ? ?Cb#: [phone] ? ?

## 2021-05-31 ENCOUNTER — Other Ambulatory Visit: Payer: Self-pay

## 2021-05-31 ENCOUNTER — Other Ambulatory Visit: Payer: Self-pay | Admitting: Family Medicine

## 2021-05-31 DIAGNOSIS — Z1231 Encounter for screening mammogram for malignant neoplasm of breast: Secondary | ICD-10-CM

## 2021-05-31 NOTE — Patient Outreach (Signed)
Aging Gracefully Program ? ?05/31/2021 ? ?[name] ?January 04, 1956 ?[id number] ? ?[facility] Evaluation Interviewer made contact with patient. Aging Gracefully initial survey completed.  ? ?Interviewer will send referral to RN and OT for follow up. ? ? ?Baruch Gouty ?Care Management Assistant  ?[phone] ?

## 2021-06-04 ENCOUNTER — Ambulatory Visit (HOSPITAL_BASED_OUTPATIENT_CLINIC_OR_DEPARTMENT_OTHER)
Admission: RE | Admit: 2021-06-04 | Discharge: 2021-06-04 | Disposition: A | Discharge status: Home / Self Care | Payer: 59 | Source: Ambulatory Visit | Attending: Family Medicine | Admitting: Family Medicine

## 2021-06-04 DIAGNOSIS — Z1231 Encounter for screening mammogram for malignant neoplasm of breast: Secondary | ICD-10-CM | POA: Insufficient documentation

## 2021-06-07 NOTE — Telephone Encounter (Signed)
Rec' 06/07/21  ? ?Coverage fo Ondeansetron HCL 4mg  has been Denied due to your plan does not meet criteria ?

## 2021-06-08 NOTE — Telephone Encounter (Signed)
Rec' 06/07/21  ?  ?Coverage fo Ondansetron HCL 4mg  has been Denied due to your plan does not meet criteria ? ? ?4/52/23 Spoke with pt today and per pt she already picked up the medicine. ?

## 2021-06-14 ENCOUNTER — Ambulatory Visit: Payer: 59 | Admitting: Orthopedic Surgery

## 2021-06-14 DIAGNOSIS — M65322 Trigger finger, left index finger: Secondary | ICD-10-CM | POA: Diagnosis not present

## 2021-06-14 DIAGNOSIS — M1812 Unilateral primary osteoarthritis of first carpometacarpal joint, left hand: Secondary | ICD-10-CM | POA: Diagnosis not present

## 2021-06-14 DIAGNOSIS — M65341 Trigger finger, right ring finger: Secondary | ICD-10-CM

## 2021-06-14 NOTE — H&P (View-Only) (Signed)
Office Visit Note   Patient: [name]           Date of Birth: [birth year]           MRN: [id number] Visit Date: 06/14/2021              Requested by: Susy Frizzle, MD [address] [place],  [place] [address] PCP: Susy Frizzle, MD   Assessment & Plan: Visit Diagnoses:  1. Trigger finger, left index finger   2. Trigger finger, right ring finger   3. Arthritis of carpometacarpal (CMC) joint of left thumb     Plan: Patient presents with continued triggering of the left index and right ring fingers.  She also has arthritis of the left thumb CMC joint.  She has failed conservative management of her trigger fingers with corticosteroid injections.  She is not interested in pursuing a second corticosteroid injection.  We discussed open A1 pulley release including the nature of the surgery as well as its risks and benefits.  Specifically we discussed risks of bleeding, infection, damage to neurovascular structures, persistent triggering, damage to the flexor tendons.  She does have diabetes and so she is at increased risk of infection and wound healing complications.  After discussion, she would like to proceed with open left index finger A1 pulley release.  She would also like to try corticosteroid injection in the left thumb CMC joint at the same time.  She would like to wait to the right hand when she is recovered from the left.  Surgical date and time will be confirmed with the patient.  Follow-Up Instructions: No follow-ups on file.   Orders:  No orders of the defined types were placed in this encounter.  No orders of the defined types were placed in this encounter.     Procedures: No procedures performed   Clinical Data: No additional findings.   Subjective: Chief Complaint  Patient presents with   Right Hand - Follow-up   Left Hand - Follow-up    This is a 66 year old right-hand-dominant female who presents for above left index and right ring  finger triggering and left thumb CMC joint pain.  She was seen back in December at which time she went corticosteroid injection into the left index and right ring finger A1 pulleys.  She notes that she got some symptom relief for a few weeks.  The triggering has returned and is becoming more symptomatic.  She also presented with pain at the left thumb [facility] joint which had worsened after a fall on Christmas Day.  She has been trying Voltaren gel as she cannot tolerate oral NSAIDs with modest symptom relief.     Review of Systems   Objective: Vital Signs: There were no vitals taken for this visit.  Physical Exam Constitutional:      Appearance: Normal appearance.  Cardiovascular:     Rate and Rhythm: Normal rate.     Pulses: Normal pulses.  Pulmonary:     Effort: Pulmonary effort is normal.  Skin:    General: Skin is warm and dry.     Capillary Refill: Capillary refill takes less than 2 seconds.  Neurological:     Mental Status: She is alert.    Right Hand Exam   Tenderness  Right hand tenderness location: TTP at ring finger A1 pulley.  Other  Erythema: absent Sensation: normal Pulse: present  Comments:  Palpable and visible triggering of ring finger.    Left  Hand Exam   Comments:  Palpable and visible triggering of index finger.  Positive CMC grind test w/ pain and crepitus.  No static or dynamic MP hyper-extension.  No palmar abduction contracture.      Specialty Comments:  No specialty comments available.  Imaging: No results found.   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)    Renal  artery stenosis (HCC)    CAD (coronary artery disease)    Diabetes mellitus with circulatory complication ([place])    Dyslipidemia    History of bilateral knee replacement 05/22/2014   Cutaneous skin tags 09/09/2012   Bilateral leg pain 07/20/2012   Arthritis of knee 10/25/2011   GERD (gastroesophageal reflux disease) 11/04/2010   PAD (peripheral artery disease) ([place]) 01/30/2009   Diabetes mellitus, type II ([place]) 01/02/2009   TOBACCO ABUSE 01/02/2009   Essential hypertension 01/02/2009   Past Medical History:  Diagnosis Date   Aortic atherosclerosis ([place])    Asthma with COPD ([place])    PATIENT DENIES   CAD (coronary artery disease)    a. [place] 2011: Diffuse distal and branch vessel disease - patient managed medically, no interventional options. b. Nuc 03/2014 - low risk, no ischemia.   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 Severe stenosis mid right SFA s/p atherectomy 03/25/09. b. peripheral angiography in 12/2011 which showed only about 50% diffuse right SFA stenosis.   Pancreatitis 2003   Renal artery stenosis ([place])    a. [place] 2011 - 40-50% left RAS.   Tobacco use disorder    quit 11/10    Family History  Problem Relation Age of Onset   CVA Mother    Emphysema Father    Breast cancer Sister 17   Colon cancer Neg Hx     Past Surgical History:  Procedure Laterality Date   ABDOMINAL AORTAGRAM N/A 12/21/2011   Procedure: ABDOMINAL AORTAGRAM;  Surgeon: Wellington Hampshire, MD;  Location: [place] CATH LAB;  Service: Cardiovascular;  Laterality: N/A;   ABDOMINAL AORTOGRAM W/LOWER EXTREMITY Bilateral 05/08/2019   Procedure: ABDOMINAL AORTOGRAM W/LOWER EXTREMITY;  Surgeon: Wellington Hampshire, MD;  Location: [place] CV LAB;   Service: Cardiovascular;  Laterality: Bilateral;   CARDIAC CATHETERIZATION     ENDARTERECTOMY FEMORAL Right 10/10/2019   Procedure: RIGHT FEMORAL ENDARTERECTOMY;  Surgeon: Marty Heck, MD;  Location: [place];  Service: Vascular;  Laterality: Right;   ESOPHAGOGASTRODUODENOSCOPY (EGD) WITH PROPOFOL N/A 10/05/2017   Procedure: ESOPHAGOGASTRODUODENOSCOPY (EGD) WITH PROPOFOL;  Surgeon: Milus Banister, MD;  [facility];  Service: Endoscopy;  Laterality: N/A;   EUS N/A 10/05/2017   Procedure: UPPER ENDOSCOPIC ULTRASOUND (EUS) RADIAL;  Surgeon: Milus Banister, MD;  [facility];  Service: Endoscopy;  Laterality: N/A;   FOOT  SURGERY Left    LOWER EXTREMITY ANGIOGRAM Bilateral 05/27/2015   Procedure: Lower Extremity Angiogram;  Surgeon: Wellington Hampshire, MD;  Location: [place] CV LAB;  Service: Cardiovascular;  Laterality: Bilateral;   PATCH ANGIOPLASTY Right 10/10/2019   Procedure: PATCH ANGIOPLASTY USING Rueben Bash BIOLOGIC PATCH;  Surgeon: Marty Heck, MD;  Location: [place];  Service: Vascular;  Laterality: Right;   PERIPHERAL VASCULAR ATHERECTOMY Left 05/08/2019   Procedure: PERIPHERAL VASCULAR ATHERECTOMY;  Surgeon: Wellington Hampshire, MD;  Location: [place] CV LAB;  Service: Cardiovascular;  Laterality: Left;   PERIPHERAL VASCULAR CATHETERIZATION N/A 05/27/2015   Procedure: Abdominal Aortogram;  Surgeon: Wellington Hampshire, MD;  Location: [place] CV LAB;  Service: Cardiovascular;  Laterality: N/A;   PERIPHERAL VASCULAR CATHETERIZATION Right 05/27/2015   Procedure: Peripheral Vascular Intervention;  Surgeon: Wellington Hampshire, MD;  Location: [place] CV LAB;  Service: Cardiovascular;  Laterality: Right;  SFA   TOTAL KNEE ARTHROPLASTY Right 2011   TOTAL KNEE ARTHROPLASTY Left 05/22/2014   Procedure: LEFT TOTAL KNEE ARTHROPLASTY;  Surgeon: Latanya Maudlin, MD;  [facility];  Service: Orthopedics;  Laterality: Left;   TUBAL LIGATION  1980   Social History    Occupational History   Not on file  Tobacco Use   Smoking status: Former    Types: Cigarettes    Quit date: 12/13/2008    Years since quitting: 12.5   Smokeless tobacco: Never  Vaping Use   Vaping Use: Never used  Substance and Sexual Activity   Alcohol use: No    Comment: Quit drinking around year 2000   Drug use: No    Comment: Patient quit smoking around 2011   Sexual activity: Yes    Partners: Male

## 2021-06-14 NOTE — Progress Notes (Signed)
? ?Office Visit Note ?  ?Patient: [name]           ?Date of Birth: 1955-06-26           ?MRN: [id number] ?Visit Date: 06/14/2021 ?             ?Requested by: Susy Frizzle, MD ?[address] [place],  [place] [address] ?PCP: Susy Frizzle, MD ? ? ?Assessment & Plan: ?Visit Diagnoses:  ?1. Trigger finger, left index finger   ?2. Trigger finger, right ring finger   ?3. Arthritis of carpometacarpal (CMC) joint of left thumb   ? ? ?Plan: Patient presents with continued triggering of the left index and right ring fingers.  She also has arthritis of the left thumb CMC joint.  She has failed conservative management of her trigger fingers with corticosteroid injections.  She is not interested in pursuing a second corticosteroid injection.  We discussed open A1 pulley release including the nature of the surgery as well as its risks and benefits.  Specifically we discussed risks of bleeding, infection, damage to neurovascular structures, persistent triggering, damage to the flexor tendons.  She does have diabetes and so she is at increased risk of infection and wound healing complications.  After discussion, she would like to proceed with open left index finger A1 pulley release.  She would also like to try corticosteroid injection in the left thumb CMC joint at the same time.  She would like to wait to the right hand when she is recovered from the left.  Surgical date and time will be confirmed with the patient. ? ?Follow-Up Instructions: No follow-ups on file.  ? ?Orders:  ?No orders of the defined types were placed in this encounter. ? ?No orders of the defined types were placed in this encounter. ? ? ? ? Procedures: ?No procedures performed ? ? ?Clinical Data: ?No additional findings. ? ? ?Subjective: ?Chief Complaint  ?Patient presents with  ? Right Hand - Follow-up  ? Left Hand - Follow-up  ? ? ?This is a 66 year old right-hand-dominant female who presents for above left index and right ring  finger triggering and left thumb CMC joint pain.  She was seen back in December at which time she went corticosteroid injection into the left index and right ring finger A1 pulleys.  She notes that she got some symptom relief for a few weeks.  The triggering has returned and is becoming more symptomatic.  She also presented with pain at the left thumb [facility] joint which had worsened after a fall on Christmas Day.  She has been trying Voltaren gel as she cannot tolerate oral NSAIDs with modest symptom relief. ? ? ? ? ?Review of Systems ? ? ?Objective: ?Vital Signs: There were no vitals taken for this visit. ? ?Physical Exam ?Constitutional:   ?   Appearance: Normal appearance.  ?Cardiovascular:  ?   Rate and Rhythm: Normal rate.  ?   Pulses: Normal pulses.  ?Pulmonary:  ?   Effort: Pulmonary effort is normal.  ?Skin: ?   General: Skin is warm and dry.  ?   Capillary Refill: Capillary refill takes less than 2 seconds.  ?Neurological:  ?   Mental Status: She is alert.  ? ? ?Right Hand Exam  ? ?Tenderness  ?Right hand tenderness location: TTP at ring finger A1 pulley. ? ?Other  ?Erythema: absent ?Sensation: normal ?Pulse: present ? ?Comments:  Palpable and visible triggering of ring finger.  ? ? ?Left  Hand Exam  ? ?Comments:  Palpable and visible triggering of index finger.  Positive CMC grind test w/ pain and crepitus.  No static or dynamic MP hyper-extension.  No palmar abduction contracture.  ? ? ? ? ?Specialty Comments:  ?No specialty comments available. ? ?Imaging: ?No results found. ? ? ?PMFS History: ?Patient Active Problem List  ? Diagnosis Date Noted  ? Arthritis of carpometacarpal [facility]) joint of left thumb 06/14/2021  ? Pain in left hand 02/23/2021  ? Trigger finger, left index finger 01/28/2021  ? Trigger finger, right ring finger 01/28/2021  ? 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 Chronic pancreatitis ([place]) 09/28/2018  ? Asthma with COPD ([place])   ? CKD (chronic kidney disease), stage III ([place])   ? Renal  artery stenosis ([place])   ? CAD (coronary artery disease)   ? 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 Dyslipidemia   ? History of bilateral knee replacement 05/22/2014  ? Cutaneous skin tags 09/09/2012  ? Bilateral leg pain 07/20/2012  ? Arthritis of knee 10/25/2011  ? GERD (gastroesophageal reflux disease) 11/04/2010  ? PAD (peripheral artery disease) ([place]) 01/30/2009  ? Diabetes mellitus, type II ([place]) 01/02/2009  ? TOBACCO ABUSE 01/02/2009  ? Essential hypertension 01/02/2009  ? ?Past Medical History:  ?Diagnosis Date  ? Aortic atherosclerosis ([place])   ? Asthma with COPD ([place])   ? PATIENT DENIES  ? CAD (coronary artery disease)   ? a. [place] 2011: Diffuse distal and branch vessel disease - patient managed medically, no interventional options. b. Nuc 03/2014 - low risk, no ischemia.  ? Complication of anesthesia   ? slow to wake up with last surgery in 2011   ? 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 Diverticulosis   ? Dyslipidemia   ? Headache   ? hx of migraines   ? Hypertension   ? Osteoarthritis   ? severe right knee  R TKR  ? PAD (peripheral artery disease) ([place])   ? a. Severe stenosis mid right SFA s/p atherectomy 03/25/09. b. peripheral angiography in 12/2011 which showed only about 50% diffuse right SFA stenosis.  ? Pancreatitis 2003  ? Renal artery stenosis ([place])   ? a. [place] 2011 - 40-50% left RAS.  ? Tobacco use disorder   ? quit 11/10  ?  ?Family History  ?Problem Relation Age of Onset  ? CVA Mother   ? Emphysema Father   ? Breast cancer Sister 87  ? Colon cancer Neg Hx   ?  ?Past Surgical History:  ?Procedure Laterality Date  ? ABDOMINAL AORTAGRAM N/A 12/21/2011  ? Procedure: ABDOMINAL AORTAGRAM;  Surgeon: Wellington Hampshire, MD;  [facility] CATH LAB;  Service: Cardiovascular;  Laterality: N/A;  ? ABDOMINAL AORTOGRAM W/LOWER EXTREMITY Bilateral 05/08/2019  ? Procedure: ABDOMINAL AORTOGRAM W/LOWER EXTREMITY;  Surgeon: Wellington Hampshire, MD;  Location: [place] CV LAB;   Service: Cardiovascular;  Laterality: Bilateral;  ? CARDIAC CATHETERIZATION    ? ENDARTERECTOMY FEMORAL Right 10/10/2019  ? Procedure: RIGHT FEMORAL ENDARTERECTOMY;  Surgeon: Marty Heck, MD;  Location: [place];  Service: Vascular;  Laterality: Right;  ? ESOPHAGOGASTRODUODENOSCOPY (EGD) WITH PROPOFOL N/A 10/05/2017  ? Procedure: ESOPHAGOGASTRODUODENOSCOPY (EGD) WITH PROPOFOL;  Surgeon: Milus Banister, MD;  [facility];  Service: Endoscopy;  Laterality: N/A;  ? EUS N/A 10/05/2017  ? Procedure: UPPER ENDOSCOPIC ULTRASOUND (EUS) RADIAL;  Surgeon: Milus Banister, MD;  [facility];  Service: Endoscopy;  Laterality: N/A;  ? FOOT  SURGERY Left   ? LOWER EXTREMITY ANGIOGRAM Bilateral 05/27/2015  ? Procedure: Lower Extremity Angiogram;  Surgeon: Wellington Hampshire, MD;  Location: [place] CV LAB;  Service: Cardiovascular;  Laterality: Bilateral;  ? PATCH ANGIOPLASTY Right 10/10/2019  ? Procedure: PATCH ANGIOPLASTY USING Rueben Bash BIOLOGIC PATCH;  Surgeon: Marty Heck, MD;  Location: [facility] OR;  Service: Vascular;  Laterality: Right;  ? PERIPHERAL VASCULAR ATHERECTOMY Left 05/08/2019  ? Procedure: PERIPHERAL VASCULAR ATHERECTOMY;  Surgeon: Wellington Hampshire, MD;  Location: [place] CV LAB;  Service: Cardiovascular;  Laterality: Left;  ? PERIPHERAL VASCULAR CATHETERIZATION N/A 05/27/2015  ? Procedure: Abdominal Aortogram;  Surgeon: Wellington Hampshire, MD;  Location: [place] CV LAB;  Service: Cardiovascular;  Laterality: N/A;  ? PERIPHERAL VASCULAR CATHETERIZATION Right 05/27/2015  ? Procedure: Peripheral Vascular Intervention;  Surgeon: Wellington Hampshire, MD;  Location: [place] CV LAB;  Service: Cardiovascular;  Laterality: Right;  SFA  ? TOTAL KNEE ARTHROPLASTY Right 2011  ? TOTAL KNEE ARTHROPLASTY Left 05/22/2014  ? Procedure: LEFT TOTAL KNEE ARTHROPLASTY;  Surgeon: Latanya Maudlin, MD;  [facility];  Service: Orthopedics;  Laterality: Left;  ? TUBAL LIGATION  1980  ? ?Social History   ? ?Occupational History  ? Not on file  ?Tobacco Use  ? Smoking status: Former  ?  Types: Cigarettes  ?  Quit date: 12/13/2008  ?  Years since quitting: 12.5  ? Smokeless tobacco: Never  ?Vaping Use  ? Vaping Use

## 2021-06-15 ENCOUNTER — Telehealth: Payer: Self-pay | Admitting: Family Medicine

## 2021-06-15 NOTE — Telephone Encounter (Signed)
Patient called in states that her insurance won't pay for zofran and would like to know if there is something else she can take for nausea. She uses [facility] on [address]. ? ?CB# [phone] ?

## 2021-06-18 ENCOUNTER — Other Ambulatory Visit: Payer: Self-pay | Admitting: Family Medicine

## 2021-06-18 ENCOUNTER — Other Ambulatory Visit: Payer: Self-pay

## 2021-06-18 MED ORDER — PROMETHAZINE HCL 25 MG PO TABS: 25.0000 mg | ORAL_TABLET | Freq: Three times a day (TID) | ORAL | 0 refills | Status: DC | PRN

## 2021-06-18 MED ORDER — HYDROCODONE-ACETAMINOPHEN 5-325 MG PO TABS: 1.0000 | ORAL_TABLET | Freq: Four times a day (QID) | ORAL | 0 refills | Status: DC | PRN

## 2021-06-18 NOTE — Telephone Encounter (Signed)
Call pt and is aware of Rx sent to pharmacy. Nothing at this time.  ?

## 2021-06-18 NOTE — Telephone Encounter (Signed)
LOV 01/11/21 ?Last refill 05/18/21, #130, 0 refills ? ?Please review, thanks! ? ?

## 2021-06-18 NOTE — Telephone Encounter (Signed)
Pt called in requesting a refill of HYDROcodone-acetaminophen (NORCO/VICODIN) 5-325 MG tablet to be sent to [facility] on [address] in [place]. Pt also stated that since having issues with insurance not covering a nausea med, pt wanted to know if there was another type of med that she could get that would be covered by her insurance. Please advise. ? ?Cb#: [phone], pt stated that it is ok to leave a vm if she does not answer with info about either med. ? ?

## 2021-06-18 NOTE — Telephone Encounter (Signed)
Request for pain medication refill has been forwarded to RF que. Attempted to call patient regarding nausea medication- Ondansetron it seems she has had trouble with insurance coverage per chart notes. Patient is requesting an alternative. Left message for patient to call office back. ?

## 2021-06-18 NOTE — Telephone Encounter (Signed)
Requested medication (s) are due for refill today - provider review  ? ?Requested medication (s) are on the active medication list -yes ? ?Future visit scheduled -no ? ?Last refill: 05/18/21 #130 ? ?Notes to clinic: Request RF: non delegated Rx, attempted to call patient regarding request for alternative Rx- Zofran- patient is having trouble getting insurance coverage for this medication and is requesting alternative- left message to call office ? ?Requested Prescriptions  ?Pending Prescriptions Disp Refills  ? HYDROcodone-acetaminophen (NORCO/VICODIN) 5-325 MG tablet 130 tablet 0  ?  Sig: Take 1 tablet by mouth every 6 (six) hours as needed for moderate pain. Chronic Pain. Dx: G89.4  ?  ? Not Delegated - Analgesics:  Opioid Agonist Combinations Failed - 06/18/2021 11:05 AM  ?  ?  Failed - This refill cannot be delegated  ?  ?  Failed - Urine Drug Screen completed in last 360 days  ?  ?  Failed - Valid encounter within last 3 months  ?  Recent Outpatient Visits   ? ?      ? 5 months ago Type 2 diabetes mellitus with diabetic neuropathy, unspecified whether long term insulin use (HCC)  ? [facility] [place], Priscille Heidelberg, MD  ? 1 year ago Type 2 diabetes mellitus with diabetic neuropathy, unspecified whether long term insulin use (HCC)  ? [facility] [place], Priscille Heidelberg, MD  ? 1 year ago Gynecologic exam normal  ? [facility] Cathlean Marseilles A, NP  ? 1 year ago Type 2 diabetes mellitus with other circulatory complication, without long-term current use of insulin (HCC)  ? [facility] [place], Priscille Heidelberg, MD  ? 1 year ago Injury of left toe, initial encounter  ? [facility] [place], Velna Hatchet, MD  ? ?  ?  ? ? ?  ?  ?  ? ? ? ?Requested Prescriptions  ?Pending Prescriptions Disp Refills  ? HYDROcodone-acetaminophen (NORCO/VICODIN) 5-325 MG tablet 130 tablet 0  ?  Sig: Take 1 tablet by mouth every 6 (six) hours as needed for moderate pain. Chronic  Pain. Dx: G89.4  ?  ? Not Delegated - Analgesics:  Opioid Agonist Combinations Failed - 06/18/2021 11:05 AM  ?  ?  Failed - This refill cannot be delegated  ?  ?  Failed - Urine Drug Screen completed in last 360 days  ?  ?  Failed - Valid encounter within last 3 months  ?  Recent Outpatient Visits   ? ?      ? 5 months ago Type 2 diabetes mellitus with diabetic neuropathy, unspecified whether long term insulin use (HCC)  ? [facility] [place], Priscille Heidelberg, MD  ? 1 year ago Type 2 diabetes mellitus with diabetic neuropathy, unspecified whether long term insulin use (HCC)  ? [facility] [place], Priscille Heidelberg, MD  ? 1 year ago Gynecologic exam normal  ? [facility] Cathlean Marseilles A, NP  ? 1 year ago Type 2 diabetes mellitus with other circulatory complication, without long-term current use of insulin (HCC)  ? [facility] [place], Priscille Heidelberg, MD  ? 1 year ago Injury of left toe, initial encounter  ? [facility] [place], Velna Hatchet, MD  ? ?  ?  ? ? ?  ?  ?  ? ? ? ?

## 2021-06-21 ENCOUNTER — Telehealth: Payer: Self-pay

## 2021-06-21 NOTE — Telephone Encounter (Signed)
? ?  Pre-operative Risk Assessment  ?  ?Patient Name: [name]  ?DOB: December 17, 1955 ?MRN: [id number]  ? ? ?Request for Surgical Clearance   ? ?Procedure:   Left index finger trigger release, left thumb carpometacarpal injection. ? ?Date of Surgery:  Clearance TBD                              ?   ?Surgeon:  Marlyne Beards, MD ?Surgeon's Group or Practice Name:  [facility]  ?Phone number:  [phone] ?Fax number:  [phone] ?  ?Type of Clearance Requested:   ?- Medical  ?- Pharmacy:  Hold Clopidogrel (Plavix)   ?  ?Type of Anesthesia:   Choice ?  ?Additional requests/questions:  N/A ? ?Signed, ?Cydney Ok   ?06/21/2021, 3:53 PM   ?

## 2021-06-21 NOTE — Telephone Encounter (Signed)
? ? ?  Name: [name]  ?DOB: 1955/09/16  ?MRN: [id number] ? ?Primary Cardiologist: None ?Hx of PAD, CAD, HLD.  ? ?Previous cardiac catheterization in 2011 showed distal LAD and small branch disease which is being managed medically.   Lexiscan Myoview 06/2020 showed evidence of mild anterior wall ischemia.  She has been treated medically given stability of symptoms. ? ?Dr. Kirke Corin, can patient hold plavix for 5 days? Will need phone visit prior to clearance.  ?  ?

## 2021-06-24 ENCOUNTER — Telehealth: Payer: Self-pay | Admitting: *Deleted

## 2021-06-24 NOTE — Telephone Encounter (Signed)
Pt called back and has been scheduled for tele pre op appt 06/28/21. Med rec and consent are done.  ? ?  ?Patient Consent for Virtual Visit  ? ? ?   ? ?[name] has provided verbal consent on 06/24/2021 for a virtual visit (video or telephone). ? ? ?CONSENT FOR VIRTUAL VISIT FOR:  [name]  ?By participating in this virtual visit I agree to the following: ? ?I hereby voluntarily request, consent and authorize [facility] and its employed or contracted physicians, physician assistants, nurse practitioners or other licensed health care professionals (the Practitioner), to provide me with telemedicine health care services (the ?Services") as deemed necessary by the treating Practitioner. I acknowledge and consent to receive the Services by the Practitioner via telemedicine. I understand that the telemedicine visit will involve communicating with the Practitioner through live audiovisual communication technology and the disclosure of certain medical information by electronic transmission. I acknowledge that I have been given the opportunity to request an in-person assessment or other available alternative prior to the telemedicine visit and am voluntarily participating in the telemedicine visit. ? ?I understand that I have the right to withhold or withdraw my consent to the use of telemedicine in the course of my care at any time, without affecting my right to future care or treatment, and that the Practitioner or I may terminate the telemedicine visit at any time. I understand that I have the right to inspect all information obtained and/or recorded in the course of the telemedicine visit and may receive copies of available information for a reasonable fee.  I understand that some of the potential risks of receiving the Services via telemedicine include:  ?Delay or interruption in medical evaluation due to technological equipment failure or disruption; ?Information transmitted may not be  sufficient (e.g. poor resolution of images) to allow for appropriate medical decision making by the Practitioner; and/or  ?In rare instances, security protocols could fail, causing a breach of personal health information. ? ?Furthermore, I acknowledge that it is my responsibility to provide information about my medical history, conditions and care that is complete and accurate to the best of my ability. I acknowledge that Practitioner's advice, recommendations, and/or decision may be based on factors not within their control, such as incomplete or inaccurate data provided by me or distortions of diagnostic images or specimens that may result from electronic transmissions. I understand that the practice of medicine is not an exact science and that Practitioner makes no warranties or guarantees regarding treatment outcomes. I acknowledge that a copy of this consent can be made available to me via my patient portal [facility] MyChart), or I can request a printed copy by calling the office of [facility].   ? ?I understand that my insurance will be billed for this visit.  ? ?I have read or had this consent read to me. ?I understand the contents of this consent, which adequately explains the benefits and risks of the Services being provided via telemedicine.  ?I have been provided ample opportunity to ask questions regarding this consent and the Services and have had my questions answered to my satisfaction. ?I give my informed consent for the services to be provided through the use of telemedicine in my medical care ? ? ? ?

## 2021-06-24 NOTE — Telephone Encounter (Signed)
Ok to hold Plavix.

## 2021-06-24 NOTE — Telephone Encounter (Signed)
Left message for the pt to call for tele pre op appt 

## 2021-06-24 NOTE — Telephone Encounter (Signed)
Her Plavix may be held for 5 days prior to her procedure. ? ?Preoperative team, please contact this patient and set up a phone call appointment for further cardiac evaluation.  Thank you for your help. ? ?Thomasene Ripple. Jamarii Banks NP-C ? ?  ?06/24/2021, 9:41 AM ?[facility] ?[address] ?Office [phone] Fax [phone] ? ?

## 2021-06-24 NOTE — Telephone Encounter (Signed)
Pt called back and has been scheduled for tele pre op appt 06/28/21. Med rec and consent are done.  ?

## 2021-06-28 ENCOUNTER — Ambulatory Visit (INDEPENDENT_AMBULATORY_CARE_PROVIDER_SITE_OTHER): Payer: 59 | Admitting: Nurse Practitioner

## 2021-06-28 DIAGNOSIS — Z01810 Encounter for preprocedural cardiovascular examination: Secondary | ICD-10-CM

## 2021-06-28 NOTE — Progress Notes (Signed)
? ?Virtual Visit via Telephone Note  ? ?This visit type was conducted due to national recommendations for restrictions regarding the COVID-19 Pandemic (e.g. social distancing) in an effort to limit this patient's exposure and mitigate transmission in our community.  Due to her co-morbid illnesses, this patient is at least at moderate risk for complications without adequate follow up.  This format is felt to be most appropriate for this patient at this time.  The patient did not have access to video technology/had technical difficulties with video requiring transitioning to audio format only (telephone).  All issues noted in this document were discussed and addressed.  No physical exam could be performed with this format.  Please refer to the patient's chart for her  consent to telehealth for [facility]. ? ?Evaluation Performed:  Preoperative cardiovascular risk assessment ?_____________  ? ?Date:  06/28/2021  ? ?Patient ID:  Shakerria, Garza [birth year], MRN [id number] ?Patient Location:  ?Home ?Provider location:   ?Office ? ?Primary Care Provider:  Susy Frizzle, MD ?Primary Cardiologist:  Kathlyn Sacramento, MD ? ?Chief Complaint  ?  ?66 y.o. y/o female with a h/o PAD with claudication s/p L SFA atherectomy/balloon angioplasty and R common femoral artery endarterectomy, CAD (managed medically), hypertension, hyperlipidemia, and type 2 diabetes, who is pending L index finger trigger release, left thumb carpometacarpal injection, date TBD, with Dr. Sherilyn Cooter of [place], and presents today for telephonic preoperative cardiovascular risk assessment. ? ?Past Medical History  ?  ?Past Medical History:  ?Diagnosis Date  ? Aortic atherosclerosis ([place])   ? Asthma with COPD ([place])   ? PATIENT DENIES  ? CAD (coronary artery disease)   ? a. [place] 2011: Diffuse distal and branch vessel disease - patient managed medically, no interventional options. b. [place] 03/2014 - low risk, no ischemia.  ? Complication of  anesthesia   ? slow to wake up with last surgery in 2011   ? Diabetes mellitus with circulatory complication ([place])   ? Diverticulosis   ? Dyslipidemia   ? Headache   ? hx of migraines   ? Hypertension   ? Osteoarthritis   ? severe right knee  R TKR  ? PAD (peripheral artery disease) ([place])   ? a. Severe stenosis mid right SFA s/p atherectomy 03/25/09. b. peripheral angiography in 12/2011 which showed only about 50% diffuse right SFA stenosis.  ? Pancreatitis 2003  ? Renal artery stenosis ([place])   ? a. [place] 2011 - 40-50% left RAS.  ? Tobacco use disorder   ? quit 11/10  ? ?Past Surgical History:  ?Procedure Laterality Date  ? ABDOMINAL AORTAGRAM N/A 12/21/2011  ? Procedure: ABDOMINAL AORTAGRAM;  Surgeon: Wellington Hampshire, MD;  Location: [facility] CATH LAB;  Service: Cardiovascular;  Laterality: N/A;  ? ABDOMINAL AORTOGRAM W/LOWER EXTREMITY Bilateral 05/08/2019  ? Procedure: ABDOMINAL AORTOGRAM W/LOWER EXTREMITY;  Surgeon: Wellington Hampshire, MD;  Location: [place] CV LAB;  Service: Cardiovascular;  Laterality: Bilateral;  ? CARDIAC CATHETERIZATION    ? ENDARTERECTOMY FEMORAL Right 10/10/2019  ? Procedure: RIGHT FEMORAL ENDARTERECTOMY;  Surgeon: Marty Heck, MD;  Location: [place];  Service: Vascular;  Laterality: Right;  ? ESOPHAGOGASTRODUODENOSCOPY (EGD) WITH PROPOFOL N/A 10/05/2017  ? Procedure: ESOPHAGOGASTRODUODENOSCOPY (EGD) WITH PROPOFOL;  Surgeon: Milus Banister, MD;  Location: [facility] ENDOSCOPY;  Service: Endoscopy;  Laterality: N/A;  ? EUS N/A 10/05/2017  ? Procedure: UPPER ENDOSCOPIC ULTRASOUND (EUS) RADIAL;  Surgeon: Milus Banister, MD;  Location: [facility] ENDOSCOPY;  Service: Endoscopy;  Laterality: N/A;  ?  FOOT SURGERY Left   ? LOWER EXTREMITY ANGIOGRAM Bilateral 05/27/2015  ? Procedure: Lower Extremity Angiogram;  Surgeon: Iran Ouch, MD;  Location: [facility] INVASIVE CV LAB;  Service: Cardiovascular;  Laterality: Bilateral;  ? PATCH ANGIOPLASTY Right 10/10/2019  ? Procedure: PATCH ANGIOPLASTY USING Livia Snellen BIOLOGIC  PATCH;  Surgeon: Cephus Shelling, MD;  Location: [facility] OR;  Service: Vascular;  Laterality: Right;  ? PERIPHERAL VASCULAR ATHERECTOMY Left 05/08/2019  ? Procedure: PERIPHERAL VASCULAR ATHERECTOMY;  Surgeon: Iran Ouch, MD;  Location: [facility] INVASIVE CV LAB;  Service: Cardiovascular;  Laterality: Left;  ? PERIPHERAL VASCULAR CATHETERIZATION N/A 05/27/2015  ? Procedure: Abdominal Aortogram;  Surgeon: Iran Ouch, MD;  Location: [facility] INVASIVE CV LAB;  Service: Cardiovascular;  Laterality: N/A;  ? PERIPHERAL VASCULAR CATHETERIZATION Right 05/27/2015  ? Procedure: Peripheral Vascular Intervention;  Surgeon: Iran Ouch, MD;  Location: [facility] INVASIVE CV LAB;  Service: Cardiovascular;  Laterality: Right;  SFA  ? TOTAL KNEE ARTHROPLASTY Right 2011  ? TOTAL KNEE ARTHROPLASTY Left 05/22/2014  ? Procedure: LEFT TOTAL KNEE ARTHROPLASTY;  Surgeon: Ranee Gosselin, MD;  Location: [facility] ORS;  Service: Orthopedics;  Laterality: Left;  ? TUBAL LIGATION  1980  ? ? ?Allergies ? ?Allergies  ?Allergen Reactions  ? Gabapentin Other (See Comments)  ?  Brain shakes  ? Glucotrol [Glipizide] Rash and Other (See Comments)  ?  Red rash  ? Influenza Vaccines Other (See Comments)  ?  Significant arm soreness requiring 1 year of physical therapy  ? Latex Rash  ? Nortriptyline Other (See Comments)  ?  BRAIN SHAKE  ? Tetanus Toxoid Hives, Swelling, Rash and Other (See Comments)  ?  Swelling to site of injection with rash and fever  ? Other Itching, Other (See Comments) and Cough  ?  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  Patient takes Claritin for this type of reaction.  Patient stated that if it goes untreated then he begins to have difficulty breathing  ? Invokana [Canagliflozin] Other (See Comments)  ?  Yeast Infections  ? ? ?History of Present Illness  ?  ?Florentine Avelyn Blunck is a 66 y.o. female who presents via audio/video conferencing for a telehealth  visit today. Pt was last seen in cardiology clinic on 11/17/2020 by Dr. Kirke Corin. At that time Coco Sravani Drey was doing well from a cardiac standpoint. Continued medical therapy was recommended for PAD. Most recent Lexiscan Myoview in 06/2020 showed evidence of mild anterior wall ischemia. However, patient denied symptoms concerning for angina.The patient is now pending procedure as outlined above. Since her last visit, she been stable overall from a cardiac standpoint.  She does have stable claudication with prolonged walking, however, this is unchanged.  She has noted some mild dependent non-pitting bilateral lower extremity edema in the setting of increased activity, however, this improves with elevation and rest. She denies chest pain, palpitations, dyspnea, pnd, orthopnea, n, v, dizziness, syncope, weight gain, or early satiety. All other systems reviewed and are otherwise negative except as noted above.  Overall, she reports feeling well, denies any new symptoms today. ? ?Home Medications  ?  ?Prior to Admission medications   ?Medication Sig Start Date End Date Taking? Authorizing Provider  ?acetaminophen (TYLENOL) 500 MG tablet Take 1,000 mg by mouth every 6 (six) hours as needed for moderate pain or headache.    [provider]  ?albuterol (VENTOLIN HFA)  108 (90 Base) MCG/ACT inhaler INHALE 2 PUFFS INTO THE LUNGS EVERY 6 HOURS AS NEEDED FOR WHEEZING OR SHORTNESS OF BREATH 11/09/20   Susy Frizzle, MD  ?amitriptyline (ELAVIL) 50 MG tablet TAKE 1 TABLET(50 MG) BY MOUTH AT BEDTIME ?Patient not taking: Reported on 06/24/2021 01/21/21   Susy Frizzle, MD  ?Biotin w/ Vitamins C & E (HAIR/SKIN/NAILS PO) Take 1 tablet by mouth daily.    [provider]  ?bisoprolol (ZEBETA) 5 MG tablet TAKE 1/2 TABLET(2.5 MG) BY MOUTH DAILY 03/01/21   Susy Frizzle, MD  ?clopidogrel (PLAVIX) 75 MG tablet TAKE 1 TABLET(75 MG) BY MOUTH DAILY 03/22/21   Susy Frizzle, MD  ?diclofenac sodium (VOLTAREN) 1 %  GEL Apply 2 g topically 4 (four) times daily. ?Patient taking differently: Apply 2 g topically 4 (four) times daily as needed (for hand pain). 05/04/16   Susy Frizzle, MD  ?fluticasone-salmeterol (ADVAI

## 2021-06-29 ENCOUNTER — Other Ambulatory Visit: Payer: Self-pay

## 2021-06-29 ENCOUNTER — Encounter (HOSPITAL_BASED_OUTPATIENT_CLINIC_OR_DEPARTMENT_OTHER): Payer: Self-pay | Admitting: Orthopedic Surgery

## 2021-06-29 ENCOUNTER — Other Ambulatory Visit: Payer: Self-pay | Admitting: Occupational Therapy

## 2021-06-30 ENCOUNTER — Encounter (HOSPITAL_BASED_OUTPATIENT_CLINIC_OR_DEPARTMENT_OTHER)
Admission: RE | Admit: 2021-06-30 | Discharge: 2021-06-30 | Disposition: A | Discharge status: Home / Self Care | Payer: 59 | Source: Ambulatory Visit | Attending: Orthopedic Surgery | Admitting: Orthopedic Surgery

## 2021-06-30 DIAGNOSIS — E1159 Type 2 diabetes mellitus with other circulatory complications: Secondary | ICD-10-CM | POA: Insufficient documentation

## 2021-06-30 DIAGNOSIS — Z01812 Encounter for preprocedural laboratory examination: Secondary | ICD-10-CM | POA: Insufficient documentation

## 2021-06-30 LAB — BASIC METABOLIC PANEL
Anion gap: 7 (ref 5–15)
BUN: 20 mg/dL (ref 8–23)
CO2: 25 mmol/L (ref 22–32)
Calcium: 9.7 mg/dL (ref 8.9–10.3)
Chloride: 105 mmol/L (ref 98–111)
Creatinine, Ser: 1.07 mg/dL — ABNORMAL HIGH (ref 0.44–1.00)
GFR, Estimated: 58 mL/min — ABNORMAL LOW (ref >60)
Glucose, Bld: 131 mg/dL — ABNORMAL HIGH (ref 70–99)
Potassium: 4.7 mmol/L (ref 3.5–5.1)
Sodium: 137 mmol/L (ref 135–145)

## 2021-06-30 NOTE — Progress Notes (Signed)
G2 given for eras protocol with written instruction on bottle to complete drink DOS by 0715. Pt verbalized understanding ? ? ? ? ?Enhanced Recovery after Surgery  ?Enhanced Recovery after Surgery is a protocol used to improve the stress on your body and your recovery after surgery. ? ?Patient Instructions ? ?The night before surgery:  ?No food after midnight. ONLY clear liquids after midnight ? ?The day of surgery (if you do NOT have diabetes):  ?Drink ONE (1) Pre-Surgery Clear Ensure as directed.   ?This drink was given to you during your hospital  ?pre-op appointment visit. ?The pre-op nurse will instruct you on the time to drink the  ?Pre-Surgery Ensure depending on your surgery time. ?Finish the drink at the designated time by the pre-op nurse.  ?Nothing else to drink after completing the  ?Pre-Surgery Clear Ensure. ? ?The day of surgery (if you have diabetes): ?Drink ONE (1) Gatorade 2 (G2) as directed. ?This drink was given to you during your hospital  ?pre-op appointment visit.  ?The pre-op nurse will instruct you on the time to drink the  ? Gatorade 2 (G2) depending on your surgery time. ?Color of the Gatorade may vary. Red is not allowed. ?Nothing else to drink after completing the  ?Gatorade 2 (G2). ? ?       If you have questions, please contact your surgeon?s office. ?

## 2021-07-01 NOTE — Patient Instructions (Signed)
Goals Addressed             This Visit's Progress    Patient Stated       She would like to be able to get her LB dressed more easily (sock aid, reacher, long shoe horn, dressing stick).     Patient Stated       She would like to feel more safe with showering and toileting in the guest bathroom. (Comfort height toilet with arm frame attachment, walk in shower v. Tub cut out; which ever one with grab bars, hand held shower, and shower seat as well as long handled sponge).     Patient Stated       Feel with getting things up high (the reacher can help with this too as well as a heavy duty step stool with handle)

## 2021-07-01 NOTE — Patient Outreach (Signed)
Aging Gracefully Program  OT Initial Visit  07/01/2021  Ronit Lorma Heater May 23, 1955 [id number]  Visit:  1- Initial Visit  Start Time:  1000 End Time:  1045 Total Minutes:  45  CCAP: Typical Daily Routine: Typical Daily Routine:: Gets up earlier and they go out and about unitl about 1:00 then come home, rest, get stuff done around the house. What Types Of Care Problems Are You Having Throughout The Day?: Bad knees that makes getting around a bit difficult (stagger at times), decreased safety with getting into and out of tub (standard) What Kind Of Help Do You Receive?: none What Do You Think Would Make Everyday Life Easier For You?: ramp up onto front porch, step in shower What Is A Good Day Like?: knees feel okay What Is A Bad Day Like?: knees bothering me more Do You Have Time For Yourself?: yes Patient Reported Equipment: Patient Reported Equipment Currently Used:  (none) Other Equipment::  (has tub seat but doesn't fit in walk in shower or tub) Functional Mobility-Stooping, Crouching, Kneeling To Retreive Item: Stooping, Crouching, or Kneeling To Retrieve Item: Unable To Do Do You:: No Device/No Assistance Importance Of Learning New Strategies::  (has a Sports administrator) Functional Mobility-Climb 1 Flight Of Stairs: Climb 1 Flight Of Stairs: Moderate Difficulty (increased time and rail) Do You:: No Device/No Assistance Intervention: Yes Other Comments:: ramp at front door Functional Mobility-Move In And Out Of Bath/Shower: Move In And Out Of A Bath/Shower: Moderate Difficulty Do You:: No Device/No Assistance Importance Of Learning New Strategies:: Very Much Safety: A Little Risk Efficiency: Not At All Intervention: Yes Other Comments:: grab bars, tub to shower conversion or tub cut out. Functional Mobility-Get On And Off Toilet: Getting Up From The Floor: A Lot Of Difficulty Do You:: Use Personal Assistance Importance Of Learning New Strategies:: Very Much (if can due to  knees) Intervention: Yes Other Comments:: Getting up from a fall options  Readiness To Change Score:  Readiness to Change Score: 10  Home Environment Assessment: Outside Home Entry:: Both back and front entrances could use some work. The back entrance some of the boards on the steps and landing ar bowing causing a trip/fall hazard. Front entrance also has some boards that could be replaced and a ramp would make it much easier to get up and down due to her knees and her husband's back, balance, and breathing issues. Living Room:: Several places in carpeting that have holes so they have multiple throw rugs covering them. The holes are a trip hazard and the throw rugs are as well. Kitchen:: Glue from [employer] is coming up through the counters top at the sink area--making it vey sticky Bathroom:: Guest bathroom has a tub shower combination and no grab bars or hand held shower--it is very hard for Mrs Everly to step into the tub due to her knees. It is also hard for her to get up and down from the toliet. Master bath has a garden tub and a small walk in shower that they recently had redone--there really isnt enough room to sit a seat in there and sit down on it due to her knees and her husband's decreased mobility. They both can stand in the shower but there are not any grab bars. There is a handheld shower. There also looks like there may be mold down near the floor on the walls where shower was put in. Other Home Environment Concerns:: Back door doesn't close easily or lock easily. HVAC is "on it's  last leg" 66 yo, roof 66 yo, master bath sink faucet it loose.   Goals:  Goals Addressed             This Visit's Progress    Patient Stated       She would like to be able to get her LB dressed more easily (sock aid, reacher, long shoe horn, dressing stick).     Patient Stated       She would like to feel more safe with showering and toileting in the guest bathroom. (Comfort height  toilet with arm frame attachment, walk in shower v. Tub cut out; which ever one with grab bars, hand held shower, and shower seat as well as long handled sponge).     Patient Stated       Feel with getting things up high (the reacher can help with this too as well as a heavy duty step stool with handle)      A copy of her goals was provided to her.  Post Clinical Reasoning: Clinician View Of Client Situation:: Mrs,Zingg does well for herself, she and husband go out and about almost every day so thay can get out of the house and go walking somewhere for exercise in the midst of running errands. She has had both knees replaced and neither one of of them fully flex to 90 degrees so that makes LB ADLs a bit more of a challenge for her, but she manages. They will celebrate their anniversary this month. Client View Of His/Her Situation:: Mrs. Rann feel likes she does well for herself and her husband. She manages all the indoor chores. She does feel that some modifications to their home would make things eaiser for them as they age. See goals. Next Visit Plan:: Sock Aid  Ignacia Palma, OTR/L Aging Gracefully [phone]

## 2021-07-05 ENCOUNTER — Other Ambulatory Visit: Payer: Self-pay | Admitting: Family Medicine

## 2021-07-05 NOTE — Telephone Encounter (Signed)
Requested medication (s) are due for refill today: yes  Requested medication (s) are on the active medication list: yes  Last refill:  Refill not received by pharmacy on 06/18/21  Future visit scheduled: no  Notes to clinic:  Please review for refill. Refill not delegated per protocol. Unable to send in refill to the pharmacy.     Requested Prescriptions  Pending Prescriptions Disp Refills   promethazine (PHENERGAN) 25 MG tablet 20 tablet 0    Sig: Take 1 tablet (25 mg total) by mouth every 8 (eight) hours as needed for nausea or vomiting.     Not Delegated - Gastroenterology: Antiemetics Failed - 07/05/2021 11:26 AM      Failed - This refill cannot be delegated      Passed - Valid encounter within last 6 months    Recent Outpatient Visits           5 months ago Type 2 diabetes mellitus with diabetic neuropathy, unspecified whether long term insulin use ([place])   [place] Pickard, Cammie Mcgee, MD   1 year ago Type 2 diabetes mellitus with diabetic neuropathy, unspecified whether long term insulin use ([place])   [place] Pickard, Cammie Mcgee, MD   1 year ago Gynecologic exam normal   [place] Eulogio Bear, NP   1 year ago Type 2 diabetes mellitus with other circulatory complication, without long-term current use of insulin ([place])   [place] Pickard, Cammie Mcgee, MD   1 year ago Injury of left toe, initial encounter   [place], Modena Nunnery, MD       Future Appointments             In 4 months Fletcher Anon, Mertie Clause, MD [facility] [place], [facility]

## 2021-07-05 NOTE — Telephone Encounter (Signed)
Patient called to follow up on medication; new prescription sent to pharmacy to replace old script of ondansetron (ZOFRAN)  Patient states insurance company will no longer cover odansetron; unsure of name of new medication.  Requesting hand written prescription for new medication; pharmacy still hasn't received script.  Please advise at [phone], or [phone] .

## 2021-07-05 NOTE — Telephone Encounter (Signed)
Called patient to confirm that the new prescription being requested was promethazine 25 mg tablet that was sent to the pharmacy on 06/18/21. Patient states she was not able to pick the prescription up earlier due to her husband being hospitalized. Patient states she contacted the pharmacy and was told the prescription was not received. Patient states she is ok if the prescription is resent to the pharmacy electronically.  Attempted to call pharmacy to confirm receipt of prescription that was sent in on 06/18/21, but unable to speak with pharmacy staff due to long hold.

## 2021-07-07 ENCOUNTER — Ambulatory Visit (HOSPITAL_BASED_OUTPATIENT_CLINIC_OR_DEPARTMENT_OTHER): Payer: 59 | Admitting: Certified Registered"

## 2021-07-07 ENCOUNTER — Encounter (HOSPITAL_BASED_OUTPATIENT_CLINIC_OR_DEPARTMENT_OTHER)
Admission: RE | Disposition: A | Discharge status: Home / Self Care | Payer: Self-pay | Source: Home / Self Care | Attending: Orthopedic Surgery

## 2021-07-07 ENCOUNTER — Ambulatory Visit (HOSPITAL_BASED_OUTPATIENT_CLINIC_OR_DEPARTMENT_OTHER)
Admission: RE | Admit: 2021-07-07 | Discharge: 2021-07-07 | Disposition: A | Discharge status: Home / Self Care | Payer: 59 | Attending: Orthopedic Surgery | Admitting: Orthopedic Surgery

## 2021-07-07 ENCOUNTER — Encounter (HOSPITAL_BASED_OUTPATIENT_CLINIC_OR_DEPARTMENT_OTHER): Payer: Self-pay | Admitting: Orthopedic Surgery

## 2021-07-07 ENCOUNTER — Other Ambulatory Visit: Payer: Self-pay

## 2021-07-07 DIAGNOSIS — I10 Essential (primary) hypertension: Secondary | ICD-10-CM

## 2021-07-07 DIAGNOSIS — M1812 Unilateral primary osteoarthritis of first carpometacarpal joint, left hand: Secondary | ICD-10-CM | POA: Insufficient documentation

## 2021-07-07 DIAGNOSIS — M65322 Trigger finger, left index finger: Secondary | ICD-10-CM

## 2021-07-07 DIAGNOSIS — E1159 Type 2 diabetes mellitus with other circulatory complications: Secondary | ICD-10-CM

## 2021-07-07 DIAGNOSIS — M189 Osteoarthritis of first carpometacarpal joint, unspecified: Secondary | ICD-10-CM

## 2021-07-07 DIAGNOSIS — I2510 Atherosclerotic heart disease of native coronary artery without angina pectoris: Secondary | ICD-10-CM

## 2021-07-07 HISTORY — DX: Unspecified asthma, uncomplicated: J45.909

## 2021-07-07 HISTORY — DX: Gastro-esophageal reflux disease without esophagitis: K21.9

## 2021-07-07 HISTORY — PX: TRIGGER FINGER RELEASE: SHX641

## 2021-07-07 LAB — GLUCOSE, CAPILLARY
Glucose-Capillary: 122 mg/dL — ABNORMAL HIGH (ref 70–99)
Glucose-Capillary: 101 mg/dL — ABNORMAL HIGH (ref 70–99)

## 2021-07-07 SURGERY — RELEASE, A1 PULLEY, FOR TRIGGER FINGER
Anesthesia: Monitor Anesthesia Care | Site: Index Finger | Laterality: Left

## 2021-07-07 MED ORDER — MIDAZOLAM HCL 2 MG/2ML IJ SOLN
INTRAMUSCULAR | Status: AC
  Filled 2021-07-07: qty 2

## 2021-07-07 MED ORDER — LIDOCAINE HCL 1 % IJ SOLN
INTRAMUSCULAR | Status: DC | PRN
  Administered 2021-07-07: 1 mL

## 2021-07-07 MED ORDER — PROPOFOL 500 MG/50ML IV EMUL
INTRAVENOUS | Status: DC | PRN
  Administered 2021-07-07: 50 ug/kg/min via INTRAVENOUS

## 2021-07-07 MED ORDER — PHENYLEPHRINE 80 MCG/ML (10ML) SYRINGE FOR IV PUSH (FOR BLOOD PRESSURE SUPPORT)
PREFILLED_SYRINGE | INTRAVENOUS | Status: AC
  Filled 2021-07-07: qty 10

## 2021-07-07 MED ORDER — FENTANYL CITRATE (PF) 100 MCG/2ML IJ SOLN
INTRAMUSCULAR | Status: DC | PRN
  Administered 2021-07-07: 50 ug via INTRAVENOUS

## 2021-07-07 MED ORDER — BUPIVACAINE HCL (PF) 0.25 % IJ SOLN
INTRAMUSCULAR | Status: AC
  Filled 2021-07-07: qty 30

## 2021-07-07 MED ORDER — OXYCODONE HCL 5 MG PO TABS: 5.0000 mg | ORAL_TABLET | Freq: Once | ORAL | Status: DC | PRN

## 2021-07-07 MED ORDER — ONDANSETRON HCL 4 MG/2ML IJ SOLN
INTRAMUSCULAR | Status: DC | PRN
  Administered 2021-07-07: 4 mg via INTRAVENOUS

## 2021-07-07 MED ORDER — OXYCODONE HCL 5 MG/5ML PO SOLN: 5.0000 mg | Freq: Once | ORAL | Status: DC | PRN

## 2021-07-07 MED ORDER — PROPOFOL 500 MG/50ML IV EMUL
INTRAVENOUS | Status: AC
  Filled 2021-07-07: qty 50

## 2021-07-07 MED ORDER — BUPIVACAINE HCL (PF) 0.25 % IJ SOLN
INTRAMUSCULAR | Status: DC | PRN
  Administered 2021-07-07: 10 mL

## 2021-07-07 MED ORDER — PHENYLEPHRINE HCL (PRESSORS) 10 MG/ML IV SOLN
INTRAVENOUS | Status: DC | PRN
  Administered 2021-07-07 (×2): 160 ug via INTRAVENOUS

## 2021-07-07 MED ORDER — MIDAZOLAM HCL 2 MG/2ML IJ SOLN
INTRAMUSCULAR | Status: DC | PRN
  Administered 2021-07-07: 2 mg via INTRAVENOUS

## 2021-07-07 MED ORDER — BETAMETHASONE SOD PHOS & ACET 6 (3-3) MG/ML IJ SUSP
INTRAMUSCULAR | Status: DC | PRN
  Administered 2021-07-07: 6 mg via INTRA_ARTICULAR

## 2021-07-07 MED ORDER — FENTANYL CITRATE (PF) 100 MCG/2ML IJ SOLN
INTRAMUSCULAR | Status: AC
  Filled 2021-07-07: qty 2

## 2021-07-07 MED ORDER — FENTANYL CITRATE (PF) 100 MCG/2ML IJ SOLN: 25.0000 ug | INTRAMUSCULAR | Status: DC | PRN

## 2021-07-07 MED ORDER — LACTATED RINGERS IV SOLN: INTRAVENOUS | Status: DC

## 2021-07-07 MED ORDER — ONDANSETRON HCL 4 MG/2ML IJ SOLN
INTRAMUSCULAR | Status: AC
  Filled 2021-07-07: qty 2

## 2021-07-07 MED ORDER — ONDANSETRON HCL 4 MG/2ML IJ SOLN: 4.0000 mg | Freq: Four times a day (QID) | INTRAMUSCULAR | Status: DC | PRN

## 2021-07-07 SURGICAL SUPPLY — 35 items
APL PRP STRL LF DISP 70% ISPRP (MISCELLANEOUS) ×1
BLADE SURG 15 STRL LF DISP TIS (BLADE) ×1 IMPLANT
BLADE SURG 15 STRL SS (BLADE) ×2
BNDG CMPR 9X4 STRL LF SNTH (GAUZE/BANDAGES/DRESSINGS) ×1
BNDG ELASTIC 2X5.8 VLCR STR LF (GAUZE/BANDAGES/DRESSINGS) ×2 IMPLANT
BNDG ESMARK 4X9 LF (GAUZE/BANDAGES/DRESSINGS) ×2 IMPLANT
CHLORAPREP W/TINT 26 (MISCELLANEOUS) ×2 IMPLANT
CORD BIPOLAR FORCEPS 12FT (ELECTRODE) ×2 IMPLANT
COVER BACK TABLE 60X90IN (DRAPES) ×2 IMPLANT
COVER MAYO STAND STRL (DRAPES) ×2 IMPLANT
CUFF TOURN SGL QUICK 18X4 (TOURNIQUET CUFF) IMPLANT
CUFF TOURN SGL QUICK 24 (TOURNIQUET CUFF)
CUFF TRNQT CYL 24X4X16.5-23 (TOURNIQUET CUFF) IMPLANT
DRAPE EXTREMITY T 121X128X90 (DISPOSABLE) ×2 IMPLANT
DRAPE SURG 17X23 STRL (DRAPES) ×2 IMPLANT
GAUZE SPONGE 4X4 12PLY STRL (GAUZE/BANDAGES/DRESSINGS) IMPLANT
GAUZE XEROFORM 1X8 LF (GAUZE/BANDAGES/DRESSINGS) ×2 IMPLANT
GLOVE BIO SURGEON STRL SZ7 (GLOVE) ×2 IMPLANT
GLOVE BIOGEL PI IND STRL 7.0 (GLOVE) ×1 IMPLANT
GLOVE BIOGEL PI INDICATOR 7.0 (GLOVE) ×1
GOWN STRL REUS W/ TWL LRG LVL3 (GOWN DISPOSABLE) ×1 IMPLANT
GOWN STRL REUS W/ TWL XL LVL3 (GOWN DISPOSABLE) ×1 IMPLANT
GOWN STRL REUS W/TWL LRG LVL3 (GOWN DISPOSABLE) ×2
GOWN STRL REUS W/TWL XL LVL3 (GOWN DISPOSABLE) ×2
NDL HYPO 25X1 1.5 SAFETY (NEEDLE) ×1 IMPLANT
NEEDLE HYPO 25X1 1.5 SAFETY (NEEDLE) ×2 IMPLANT
NS IRRIG 1000ML POUR BTL (IV SOLUTION) ×2 IMPLANT
PACK BASIN DAY SURGERY FS (CUSTOM PROCEDURE TRAY) ×2 IMPLANT
SLEEVE SCD COMPRESS KNEE MED (STOCKING) IMPLANT
SUT ETHILON 4 0 PS 2 18 (SUTURE) ×2 IMPLANT
SUT VICRYL 4-0 PS2 18IN ABS (SUTURE) IMPLANT
SYR BULB EAR ULCER 3OZ GRN STR (SYRINGE) ×2 IMPLANT
SYR CONTROL 10ML LL (SYRINGE) ×2 IMPLANT
TOWEL GREEN STERILE FF (TOWEL DISPOSABLE) ×2 IMPLANT
UNDERPAD 30X36 HEAVY ABSORB (UNDERPADS AND DIAPERS) ×2 IMPLANT

## 2021-07-07 NOTE — Anesthesia Preprocedure Evaluation (Signed)
Anesthesia Evaluation  Patient identified by MRN, date of birth, ID band Patient awake    Reviewed: Allergy & Precautions, H&P , NPO status , Patient's Chart, lab work & pertinent test results  Airway Mallampati: II   Neck ROM: full    Dental   Pulmonary asthma , COPD, former smoker,    breath sounds clear to auscultation       Cardiovascular hypertension, + CAD and + Peripheral Vascular Disease   Rhythm:regular Rate:Normal     Neuro/Psych  Headaches,    GI/Hepatic GERD  ,  Endo/Other  diabetes, Type 2  Renal/GU Renal disease     Musculoskeletal  (+) Arthritis ,   Abdominal   Peds  Hematology   Anesthesia Other Findings   Reproductive/Obstetrics                             Anesthesia Physical Anesthesia Plan  ASA: 3  Anesthesia Plan: MAC   Post-op Pain Management:    Induction: Intravenous  PONV Risk Score and Plan: 2 and Ondansetron, Propofol infusion, Treatment may vary due to age or medical condition and Midazolam  Airway Management Planned: Simple Face Mask  Additional Equipment:   Intra-op Plan:   Post-operative Plan:   Informed Consent: I have reviewed the patients History and Physical, chart, labs and discussed the procedure including the risks, benefits and alternatives for the proposed anesthesia with the patient or authorized representative who has indicated his/her understanding and acceptance.     Dental advisory given  Plan Discussed with: CRNA, Anesthesiologist and Surgeon  Anesthesia Plan Comments:         Anesthesia Quick Evaluation

## 2021-07-07 NOTE — Anesthesia Postprocedure Evaluation (Signed)
Anesthesia Post Note  Patient: [name]  Procedure(s) Performed: LEFT INDEX FINGER RELEASE TRIGGER FINGER/A-1 PULLEY / CARPOMETACARPAL INJECTION (Left: Index Finger)     Patient location during evaluation: PACU Anesthesia Type: MAC Level of consciousness: awake and alert Pain management: pain level controlled Vital Signs Assessment: post-procedure vital signs reviewed and stable Respiratory status: spontaneous breathing, nonlabored ventilation, respiratory function stable and patient connected to nasal cannula oxygen Cardiovascular status: stable and blood pressure returned to baseline Postop Assessment: no apparent nausea or vomiting Anesthetic complications: no   No notable events documented.  Last Vitals:  Vitals:   07/07/21 1145 07/07/21 1227  BP: 103/68 137/72  Pulse: 80 82  Resp: 12 18  Temp:  36.7 C  SpO2: 95% 96%    Last Pain:  Vitals:   07/07/21 1227  TempSrc: Oral  PainSc: 0-No pain                 Abdelaziz Westenberger S

## 2021-07-07 NOTE — Discharge Instructions (Addendum)
Audria Nine, M.D. Hand Surgery  POST-OPERATIVE DISCHARGE INSTRUCTIONS   PRESCRIPTIONS: - You may have been given a prescription to be taken as directed for post-operative pain control.  You may also take over the counter ibuprofen/aleve and tylenol for pain. Take this as directed on the packaging. Do not exceed 3000 mg tylenol/acetaminophen in 24 hours.  Ibuprofen 600-800 mg (3-4) tablets by mouth every 6 hours as needed for pain.   OR  Aleve 2 tablets by mouth every 12 hours (twice daily) as needed for pain.   AND/OR  Tylenol 1000 mg (2 tablets) every 8 hours as needed for pain.  - Please use your pain medication carefully, as refills are limited and you may not be provided with one.  As stated above, please use over the counter pain medicine - it will also be helpful with decreasing your swelling.    ANESTHESIA: -After your surgery, post-surgical discomfort or pain is likely. This discomfort can last several days to a few weeks. At certain times of the day your discomfort may be more intense.   Did you receive a nerve block?   - A nerve block can provide pain relief for one hour to two days after your surgery. As long as the nerve block is working, you will experience little or no sensation in the area the surgeon operated on.  - As the nerve block wears off, you will begin to experience pain or discomfort. It is very important that you begin taking your prescribed pain medication before the nerve block fully wears off. Treating your pain at the first sign of the block wearing off will ensure your pain is better controlled and more tolerable when full-sensation returns. Do not wait until the pain is intolerable, as the medicine will be less effective. It is better to treat pain in advance than to try and catch up.   General Anesthesia:  If you did not receive a nerve block during your surgery, you will need to start taking your pain medication shortly after your surgery and  should continue to do so as prescribed by your surgeon.     ICE AND ELEVATION: - You may use ice for the first 48-72 hours, but it is not critical.   - Motion of your fingers is very important to decrease the swelling.  - Elevation, as much as possible for the next 48 hours, is critical for decreasing swelling as well as for pain relief. Elevation means when you are seated or lying down, you hand should be at or above your heart. When walking, the hand needs to be at or above the level of your elbow.  - If the bandage gets too tight, it may need to be loosened. Please contact our office and we will instruct you in how to do this.    SURGICAL BANDAGES:  - Keep your dressing and/or splint clean and dry at all times.  You can remove your dressing 4 days from now and change with a dry dressing or Band-Aids as needed thereafter. - You may place a plastic bag over your bandage to shower, but be careful, do not get your bandages wet.  - After the bandages have been removed, it is OK to get the stitches wet in a shower or with hand washing. Do Not soak or submerge the wound yet. Please do not use lotions or creams on the stitches.      HAND THERAPY:  - You may not need any. If you  do, we will begin this at your follow up visit in the clinic.    ACTIVITY AND WORK: Post Anesthesia Home Care Instructions  Activity: Get plenty of rest for the remainder of the day. A responsible individual must stay with you for 24 hours following the procedure.  For the next 24 hours, DO NOT: -Drive a car -Advertising copywriter -Drink alcoholic beverages -Take any medication unless instructed by your physician -Make any legal decisions or sign important papers.  Meals: Start with liquid foods such as gelatin or soup. Progress to regular foods as tolerated. Avoid greasy, spicy, heavy foods. If nausea and/or vomiting occur, drink only clear liquids until the nausea and/or vomiting subsides. Call your physician if  vomiting continues.  Special Instructions/Symptoms: Your throat may feel dry or sore from the anesthesia or the breathing tube placed in your throat during surgery. If this causes discomfort, gargle with warm salt water. The discomfort should disappear within 24 hours.  If you had a scopolamine patch placed behind your ear for the management of post- operative nausea and/or vomiting:  1. The medication in the patch is effective for 72 hours, after which it should be removed.  Wrap patch in a tissue and discard in the trash. Wash hands thoroughly with soap and water. 2. You may remove the patch earlier than 72 hours if you experience unpleasant side effects which may include dry mouth, dizziness or visual disturbances. 3. Avoid touching the patch. Wash your hands with soap and water after contact with the patch.     - You are encouraged to move any fingers which are not in the bandage.  - Light use of the fingers is allowed to assist the other hand with daily hygiene and eating, but strong gripping or lifting is often uncomfortable and should be avoided.  - You might miss a variable period of time from work and hopefully this issue has been discussed prior to surgery. You may not do any heavy work with your affected hand for about 2 weeks.    [facility] [address] [place],  [place]  [address] [phone]

## 2021-07-07 NOTE — Interval H&P Note (Signed)
History and Physical Interval Note:  07/07/2021 9:24 AM  [name]  has presented today for surgery, with the diagnosis of LEFT INDEX FINGER TRIGGER FINGER, LEFT THUMB CARPOMETACARPAL ARTHRITIS.  The various methods of treatment have been discussed with the patient and family. After consideration of risks, benefits and other options for treatment, the patient has consented to  Procedure(s): LEFT INDEX FINGER RELEASE TRIGGER FINGER/A-1 PULLEY / CARPOMETACARPAL INJECTION (Left) as a surgical intervention.  The patient's history has been reviewed, patient examined, no change in status, stable for surgery.  I have reviewed the patient's chart and labs.  Questions were answered to the patient's satisfaction.     Siria Calandro Paschal Blanton

## 2021-07-07 NOTE — Op Note (Signed)
   Date of Surgery: 07/07/2021  INDICATIONS: Patient is a 66 y.o.-year-old female with left index finger triggering that has failed conservative management.  She also has left thumb CMC osteoarthritis.  Risks, benefits, and alternatives to surgery were again discussed with the patient in the preoperative area. The patient wishes to proceed with surgery.  Informed consent was signed after our discussion.   PREOPERATIVE DIAGNOSIS:  Left index trigger finger Left thumb CMC osteoarthritis  POSTOPERATIVE DIAGNOSIS: Same.  PROCEDURE:  Left index finger A1 pulley release Left thumb CMC corticosteroid injection   SURGEON: Audria Nine, M.D.  ASSIST:   ANESTHESIA:  Local, MAC  IV FLUIDS AND URINE: See anesthesia.  ESTIMATED BLOOD LOSS: <5 mL.  IMPLANTS: * No implants in log *   DRAINS: None  COMPLICATIONS: None  DESCRIPTION OF PROCEDURE: The patient was met in the preoperative holding area where the surgical site was marked and the consent form was verified.  The patient was then taken to the operating room and transferred to the operating table.  All bony prominences were well padded.  A tourniquet was applied to the left forearm.  Monitored sedation was induced.  A formal time-out was performed to confirm that this was the correct patient, surgery, side, and site. Following timeout, 1cc of 1% plain lidocaine and 1cc of 4m/mL betamethasone was injected into the left thumb CMC joint using fluoroscopic guidance.   Following corticosteroid injection, the operative extremity was prepped and draped in the usual and sterile fashion and a second timeout was performed.  The limb was exsanguinated and the tourniquet inflated to 250 mmHg.  A longitudinal incision was made over the A1 pulley.  The skin was incised.  Blunt dissection was used to identify the A1 pulley.  Two Ragnell retractors were placed on the radial and ulnar sides of the pulley to protect the respective neurovascular bundles.  A  third Ragnell was placed at the distal aspect of the wound.  The A1 pulley was clearly identified.  Under direct visualization, the pulley was entered sharply using a 15 blade.  Tenotomy scissors were used to complete the pulley release distally to the level of the A2 pulley.  The distal retractor was then placed in the proximal aspect of the wound.  Under direct visualization, the proximal aspect of the A1 pulley was completely released.   Following satisfactory A1 pulley release, the tourniquet was let down. The patient was reversed from sedation and asked to fully flex and extend the involved finger.  There was no catching or triggering present.  Hemostasis achieved with direct pressure and bipolar electrocautery.  The wound was then thoroughly irrigated.  It was closed using 4-0 nylon sutures in a horizontal mattress fashion.  The wound was dressed with xeroform, 4x4, and an ace wrap.    POSTOPERATIVE PLAN: She will be discharged to home with appropriate pain medication and discharge instructions.  I will see her back in the office in 10-14 days for her first postop visit.   CAudria Nine MD 11:34 AM

## 2021-07-07 NOTE — Telephone Encounter (Signed)
LOV 01/11/21 Last refill 5/523, #20, 0 refills  Please review, thanks!

## 2021-07-07 NOTE — Transfer of Care (Signed)
Immediate Anesthesia Transfer of Care Note  Patient: [name]  Procedure(s) Performed: LEFT INDEX FINGER RELEASE TRIGGER FINGER/A-1 PULLEY / CARPOMETACARPAL INJECTION (Left: Index Finger)  Patient Location: PACU  Anesthesia Type:MAC  Level of Consciousness: awake, alert  and oriented  Airway & Oxygen Therapy: Patient Spontanous Breathing and Patient connected to face mask oxygen  Post-op Assessment: Report given to RN and Post -op Vital signs reviewed and stable  Post vital signs: Reviewed and stable  Last Vitals:  Vitals Value Taken Time  BP 97/79 07/07/21 1137  Temp    Pulse 81 07/07/21 1138  Resp 12 07/07/21 1138  SpO2 100 % 07/07/21 1138  Vitals shown include unvalidated device data.  Last Pain:  Vitals:   07/07/21 0919  TempSrc: Oral  PainSc: 0-No pain         Complications: No notable events documented.

## 2021-07-08 ENCOUNTER — Encounter (HOSPITAL_BASED_OUTPATIENT_CLINIC_OR_DEPARTMENT_OTHER): Payer: Self-pay | Admitting: Orthopedic Surgery

## 2021-07-08 MED ORDER — PROMETHAZINE HCL 25 MG PO TABS: 25.0000 mg | ORAL_TABLET | Freq: Three times a day (TID) | ORAL | 0 refills | Status: DC | PRN

## 2021-07-08 NOTE — Progress Notes (Signed)
Left message stating courtesy call and if any questions or concerns please call the doctors office.  

## 2021-07-16 ENCOUNTER — Other Ambulatory Visit: Payer: Self-pay | Admitting: Family Medicine

## 2021-07-16 ENCOUNTER — Ambulatory Visit (INDEPENDENT_AMBULATORY_CARE_PROVIDER_SITE_OTHER): Payer: 59 | Admitting: Orthopedic Surgery

## 2021-07-16 DIAGNOSIS — M65322 Trigger finger, left index finger: Secondary | ICD-10-CM

## 2021-07-16 NOTE — Progress Notes (Signed)
Post-Op Visit Note   Patient: [name]           Date of Birth: Jun 11, 1955           MRN: [id number] Visit Date: 07/16/2021 PCP: Susy Frizzle, MD   Assessment & Plan:  Chief Complaint:  Chief Complaint  Patient presents with   Left Index Finger - Routine Post Op   Visit Diagnoses:  1. Acquired trigger finger of left index finger     Plan: Patient is approximately 10 days s/p left index trigger release.  She is doing well postoperatively.  She has no more triggering.  Her incision is well healed without surrounding erythema or induration.  The sutures were removed.  She will follow up again when she would like to address the triggering on the contralateral hand.   Follow-Up Instructions: No follow-ups on file.   Orders:  No orders of the defined types were placed in this encounter.  No orders of the defined types were placed in this encounter.   Imaging: No results found.  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   Chronic pancreatitis ([place]) 09/28/2018   Asthma with COPD ([place])    CKD (chronic kidney disease), stage III ([place])    Renal artery stenosis (HCC)    CAD (coronary artery disease)    Diabetes mellitus with circulatory complication ([place])    Dyslipidemia    History of bilateral knee replacement 05/22/2014   Cutaneous skin tags 09/09/2012   Bilateral leg pain 07/20/2012   Arthritis of knee 10/25/2011   GERD (gastroesophageal reflux disease) 11/04/2010   PAD (peripheral artery disease) ([place]) 01/30/2009   Diabetes mellitus, type II ([place]) 01/02/2009   TOBACCO ABUSE 01/02/2009   Essential hypertension 01/02/2009   Past Medical History:  Diagnosis Date   Aortic  atherosclerosis ([place])    Asthma    CAD (coronary artery disease)    a. [place] 2011: Diffuse distal and branch vessel disease - patient managed medically, no interventional options. b. [place] 03/2014 - low risk, no ischemia.   Complication of anesthesia    slow to wake up with last surgery in 2011    Diabetes mellitus with circulatory complication ([place])    Diverticulosis    Dyslipidemia    GERD (gastroesophageal reflux disease)    Headache    hx of migraines    Hypertension    Osteoarthritis    severe right knee  R TKR   PAD (peripheral artery disease) ([place])    a. Severe stenosis mid right SFA s/p atherectomy 03/25/09. b. peripheral angiography in 12/2011 which showed only about 50% diffuse right SFA stenosis.   Pancreatitis 2003   Renal artery stenosis ([place])    a. [place] 2011 - 40-50% left RAS.   Tobacco use disorder    quit 11/10    Family History  Problem Relation Age of Onset   CVA Mother    Emphysema Father    Breast cancer Sister 67   Colon cancer Neg Hx     Past Surgical History:  Procedure Laterality Date   ABDOMINAL AORTAGRAM N/A 12/21/2011   Procedure: ABDOMINAL AORTAGRAM;  Surgeon: Wellington Hampshire, MD;  Location: [place] CATH [facility];  Service: Cardiovascular;  Laterality: N/A;   ABDOMINAL AORTOGRAM W/LOWER EXTREMITY Bilateral 05/08/2019   Procedure: ABDOMINAL AORTOGRAM W/LOWER EXTREMITY;  Surgeon: Wellington Hampshire, MD;  Location: [place] CV LAB;  Service: Cardiovascular;  Laterality: Bilateral;   CARDIAC CATHETERIZATION     ENDARTERECTOMY FEMORAL Right 10/10/2019   Procedure: RIGHT FEMORAL ENDARTERECTOMY;  Surgeon: Marty Heck, MD;  Location: [place];  Service: Vascular;  Laterality: Right;   ESOPHAGOGASTRODUODENOSCOPY (EGD) WITH PROPOFOL N/A 10/05/2017   Procedure: ESOPHAGOGASTRODUODENOSCOPY (EGD) WITH PROPOFOL;  Surgeon: Milus Banister, MD;  [facility]: [facility];  Service: Endoscopy;  Laterality: N/A;   EUS N/A 10/05/2017   Procedure: UPPER ENDOSCOPIC ULTRASOUND (EUS)  RADIAL;  Surgeon: Milus Banister, MD;  [facility];  Service: Endoscopy;  Laterality: N/A;   FOOT SURGERY Left    LOWER EXTREMITY ANGIOGRAM Bilateral 05/27/2015   Procedure: Lower Extremity Angiogram;  Surgeon: Wellington Hampshire, MD;  Location: [place] CV LAB;  Service: Cardiovascular;  Laterality: Bilateral;   PATCH ANGIOPLASTY Right 10/10/2019   Procedure: PATCH ANGIOPLASTY USING Rueben Bash BIOLOGIC PATCH;  Surgeon: Marty Heck, MD;  Location: [place];  Service: Vascular;  Laterality: Right;   PERIPHERAL VASCULAR ATHERECTOMY Left 05/08/2019   Procedure: PERIPHERAL VASCULAR ATHERECTOMY;  Surgeon: Wellington Hampshire, MD;  Location: [place] CV LAB;  Service: Cardiovascular;  Laterality: Left;   PERIPHERAL VASCULAR CATHETERIZATION N/A 05/27/2015   Procedure: Abdominal Aortogram;  Surgeon: Wellington Hampshire, MD;  Location: [place] CV LAB;  Service: Cardiovascular;  Laterality: N/A;   PERIPHERAL VASCULAR CATHETERIZATION Right 05/27/2015   Procedure: Peripheral Vascular Intervention;  Surgeon: Wellington Hampshire, MD;  Location: [place] CV LAB;  Service: Cardiovascular;  Laterality: Right;  SFA   TOTAL KNEE ARTHROPLASTY Right 2011   TOTAL KNEE ARTHROPLASTY Left 05/22/2014   Procedure: LEFT TOTAL KNEE ARTHROPLASTY;  Surgeon: Latanya Maudlin, MD;  [facility];  Service: Orthopedics;  Laterality: Left;   TRIGGER FINGER RELEASE Left 07/07/2021   Procedure: LEFT INDEX FINGER RELEASE TRIGGER FINGER/A-1 PULLEY / CARPOMETACARPAL INJECTION;  Surgeon: Sherilyn Cooter, MD;  Location: [place];  Service: Orthopedics;  Laterality: Left;   TUBAL LIGATION  1980   Social History   Occupational History   Not on file  Tobacco Use   Smoking status: Former    Types: Cigarettes    Quit date: 12/13/2008    Years since quitting: 12.5   Smokeless tobacco: Never  Vaping Use   Vaping Use: Never used  Substance and Sexual Activity   Alcohol use: No    Comment: Quit  drinking around year 2000   Drug use: No    Comment: Patient quit smoking around 2011   Sexual activity: Yes    Partners: Male    Birth control/protection: Post-menopausal

## 2021-07-16 NOTE — Telephone Encounter (Signed)
Courtesy refill. Future appt in 2 weeks.  Requested Prescriptions  Pending Prescriptions Disp Refills  . JANUVIA 100 MG tablet [Pharmacy Med Name: JANUVIA 100MG  TABLETS] 30 tablet 0    Sig: TAKE 1 TABLET(100 MG) BY MOUTH DAILY     Endocrinology:  Diabetes - DPP-4 Inhibitors Failed - 07/16/2021  1:09 PM      Failed - HBA1C is between 0 and 7.9 and within 180 days    Hgb A1c MFr Bld  Date Value Ref Range Status  01/11/2021 7.1 (H) <5.7 % of total Hgb Final    Comment:    For someone without known diabetes, a hemoglobin A1c value of 6.5% or greater indicates that they may have  diabetes and this should be confirmed with a follow-up  test. . For someone with known diabetes, a value <7% indicates  that their diabetes is well controlled and a value  greater than or equal to 7% indicates suboptimal  control. A1c targets should be individualized based on  duration of diabetes, age, comorbid conditions, and  other considerations. . Currently, no consensus exists regarding use of hemoglobin A1c for diagnosis of diabetes for children. .          Failed - Cr in normal range and within 360 days    Creat  Date Value Ref Range Status  01/11/2021 1.00 0.50 - 1.05 mg/dL Final   Creatinine, Ser  Date Value Ref Range Status  06/30/2021 1.07 (H) 0.44 - 1.00 mg/dL Final   Creatinine, Urine  Date Value Ref Range Status  02/18/2019 16 (L) 20 - 275 mg/dL Final         Failed - Valid encounter within last 6 months    Recent Outpatient Visits          6 months ago Type 2 diabetes mellitus with diabetic neuropathy, unspecified whether long term insulin use (HCC)   [facility] [place], [facility], MD   1 year ago Type 2 diabetes mellitus with diabetic neuropathy, unspecified whether long term insulin use (HCC)   [facility] [place], [facility], MD   1 year ago Gynecologic exam normal   [facility], NP   1 year ago Type 2  diabetes mellitus with other circulatory complication, without long-term current use of insulin (HCC)   [facility] [place], [facility], MD   1 year ago Injury of left toe, initial encounter   [facility] [place], [place], MD      Future Appointments            In 2 weeks [place], Velna Hatchet, MD [facility], [facility]   In 4 months [facility], Kirke Corin, MD [facility] [place], [facility]

## 2021-07-17 ENCOUNTER — Other Ambulatory Visit: Payer: Self-pay | Admitting: Family Medicine

## 2021-07-19 ENCOUNTER — Other Ambulatory Visit: Payer: Self-pay | Admitting: Family Medicine

## 2021-07-19 NOTE — Telephone Encounter (Signed)
Requested medication (s) are due for refill today: yes  Requested medication (s) are on the active medication list: yes  Last refill:  04/19/21 #120/2  Future visit scheduled: yes  Notes to clinic:  Unable to refill per protocol, cannot delegate.    Requested Prescriptions  Pending Prescriptions Disp Refills   methocarbamol (ROBAXIN) 500 MG tablet [Pharmacy Med Name: METHOCARBAMOL 500MG  TABLETS] 120 tablet 2    Sig: TAKE 1 TABLET BY MOUTH EVERY 6 HOURS AS NEEDED FOR MUSCLE SPASM     Not Delegated - Analgesics:  Muscle Relaxants Failed - 07/17/2021  5:55 PM      Failed - This refill cannot be delegated      Failed - Valid encounter within last 6 months    Recent Outpatient Visits           6 months ago Type 2 diabetes mellitus with diabetic neuropathy, unspecified whether long term insulin use (HCC)   09/16/2021 Family Medicine [place], [employer], MD   1 year ago Type 2 diabetes mellitus with diabetic neuropathy, unspecified whether long term insulin use (HCC)   [facility] [place], [facility], MD   1 year ago Gynecologic exam normal   [facility], NP   1 year ago Type 2 diabetes mellitus with other circulatory complication, without long-term current use of insulin (HCC)   [facility] [place], [facility], MD   1 year ago Injury of left toe, initial encounter   [facility] [place], [place], MD       Future Appointments             In 1 week [place], Velna Hatchet, MD [facility], [facility]   In 4 months [facility], Kirke Corin, MD [facility] [place], [place]

## 2021-07-19 NOTE — Telephone Encounter (Signed)
Patient called in requesting refill on her HYDROcodone-acetaminophen (NORCO/VICODIN) 5-325 MG tablet [id number]    Order Details Dose: 1 tablet Route: Oral Frequency: Every 6 hours PRN for moderate pain  Dispense Quantity: 130 tablet Refills: 0        Sig: Take 1 tablet by mouth every 6 (six) hours as needed for moderate pain. Chronic Pain. Dx: G38.4       Start Date: 06/18/21 End Date: --  Written Date: 06/18/21 Expiration Date: 12/15/21  Earliest Fill Date: 06/18/21    Original Order:  HYDROcodone-acetaminophen (NORCO/VICODIN) 5-325 MG tablet [id number]  Providers  Authorizing Provider:   Susy Frizzle, MD  [address] [place], [place] [address]  Phone:  [phone]   Fax:  [phone]  DEA #:  [id number]   NPI:  [id number]      Ordering User:  Susy Frizzle, [place], [place]

## 2021-07-20 ENCOUNTER — Telehealth: Payer: Self-pay | Admitting: Family Medicine

## 2021-07-20 NOTE — Telephone Encounter (Signed)
Requested medication (s) are due for refill today: yes  Requested medication (s) are on the active medication list: yes  Last refill:  06/18/21  Future visit scheduled: yes  Notes to clinic:  Unable to refill per protocol, cannot delegate.      Requested Prescriptions  Pending Prescriptions Disp Refills   HYDROcodone-acetaminophen (NORCO/VICODIN) 5-325 MG tablet 130 tablet 0    Sig: Take 1 tablet by mouth every 6 (six) hours as needed for moderate pain. Chronic Pain. Dx: G89.4     Not Delegated - Analgesics:  Opioid Agonist Combinations Failed - 07/19/2021 12:03 PM      Failed - This refill cannot be delegated      Failed - Urine Drug Screen completed in last 360 days      Failed - Valid encounter within last 3 months    Recent Outpatient Visits           6 months ago Type 2 diabetes mellitus with diabetic neuropathy, unspecified whether long term insulin use (HCC)   [facility] Pickard, Priscille Heidelberg, MD   1 year ago Type 2 diabetes mellitus with diabetic neuropathy, unspecified whether long term insulin use (HCC)   [facility] Pickard, Priscille Heidelberg, MD   1 year ago Gynecologic exam normal   [facility] Cathlean Marseilles A, NP   1 year ago Type 2 diabetes mellitus with other circulatory complication, without long-term current use of insulin (HCC)   [facility] Pickard, Priscille Heidelberg, MD   1 year ago Injury of left toe, initial encounter   [facility] [place], Velna Hatchet, MD       Future Appointments             In 1 week Pickard, Priscille Heidelberg, MD [facility], [facility]   In 4 months Kirke Corin, Chelsea Aus, MD [facility] [place], [facility]

## 2021-07-21 ENCOUNTER — Other Ambulatory Visit: Payer: Self-pay | Admitting: Family Medicine

## 2021-07-21 NOTE — Telephone Encounter (Signed)
Requested medication (s) are due for refill today:   Yes  Requested medication (s) are on the active medication list:   Yes  Future visit scheduled:   Yes 07/30/2021   Last ordered: 01/25/2021 #180, 0 refills  Returned because labs are due per protocol.  Provider to review for refill prior to upcoming appt.   Requested Prescriptions  Pending Prescriptions Disp Refills   metFORMIN (GLUCOPHAGE) 1000 MG tablet [Pharmacy Med Name: METFORMIN 1000MG TABLETS] 180 tablet 0    Sig: TAKE 1 TABLET BY MOUTH TWICE DAILY WITH MEALS     Endocrinology:  Diabetes - Biguanides Failed - 07/20/2021  2:52 PM      Failed - Cr in normal range and within 360 days    Creat  Date Value Ref Range Status  01/11/2021 1.00 0.50 - 1.05 mg/dL Final   Creatinine, Ser  Date Value Ref Range Status  06/30/2021 1.07 (H) 0.44 - 1.00 mg/dL Final   Creatinine, Urine  Date Value Ref Range Status  02/18/2019 16 (L) 20 - 275 mg/dL Final         Failed - HBA1C is between 0 and 7.9 and within 180 days    Hgb A1c MFr Bld  Date Value Ref Range Status  01/11/2021 7.1 (H) <5.7 % of total Hgb Final    Comment:    For someone without known diabetes, a hemoglobin A1c value of 6.5% or greater indicates that they may have  diabetes and this should be confirmed with a follow-up  test. . For someone with known diabetes, a value <7% indicates  that their diabetes is well controlled and a value  greater than or equal to 7% indicates suboptimal  control. A1c targets should be individualized based on  duration of diabetes, age, comorbid conditions, and  other considerations. . Currently, no consensus exists regarding use of hemoglobin A1c for diagnosis of diabetes for children. .          Failed - eGFR in normal range and within 360 days    GFR, Est African American  Date Value Ref Range Status  04/03/2020 69 > OR = 60 mL/min/1.42m Final   GFR, Est Non African American  Date Value Ref Range Status  04/03/2020 59 (L)  > OR = 60 mL/min/1.728mFinal   GFR, Estimated  Date Value Ref Range Status  06/30/2021 58 (L) >60 mL/min Final    Comment:    (NOTE) Calculated using the CKD-EPI Creatinine Equation (2021)    GFR  Date Value Ref Range Status  12/07/2017 59.28 (L) >60.00 mL/min Final   eGFR  Date Value Ref Range Status  01/11/2021 63 > OR = 60 mL/min/1.7395minal    Comment:    The eGFR is based on the CKD-EPI 2021 equation. To calculate  the new eGFR from a previous Creatinine or Cystatin C result, go to https://www.kidney.org/professionals/ kdoqi/gfr%5Fcalculator          Failed - Valid encounter within last 6 months    Recent Outpatient Visits           6 months ago Type 2 diabetes mellitus with diabetic neuropathy, unspecified whether long term insulin use ([place] [name]   1 year ago Type 2 diabetes mellitus with diabetic neuropathy, unspecified whether long term insulin use ([facility] [name], [name]   1 year ago Gynecologic exam normal   [name]   1 year ago Type 2 diabetes mellitus with other circulatory complication, without long-term current use of insulin ([place])   [name]   1 year ago Injury of left toe, initial encounter   [name]       Future Appointments             In 1 week [name], [name], [name] [name], [facility]   In 4 months [name], [name] [facility]             Failed - CBC within normal limits and completed in the last 12 months    WBC  Date Value Ref Range Status  04/03/2020 7.0 3.8 - 10.8 Thousand/uL Final   RBC  Date Value Ref Range Status  04/03/2020 4.31 3.80 - 5.10 Million/uL Final   Hemoglobin  Date Value Ref Range Status  04/03/2020 12.4 11.7 - 15.5 g/dL Final  05/03/2019 12.5 11.1 - 15.9 g/dL  Final   HCT  Date Value Ref Range Status  04/03/2020 39.1 35.0 - 45.0 % Final   Hematocrit  Date Value Ref Range Status  05/03/2019 40.1 34.0 - 46.6 % Final   MCHC  Date Value Ref Range Status  04/03/2020 31.7 (L) 32.0 - 36.0 g/dL Final   [facility]  Date Value Ref Range Status  04/03/2020 28.8 27.0 - 33.0 pg Final   MCV  Date Value Ref Range Status  04/03/2020 90.7 80.0 - 100.0 fL Final  05/03/2019 91 79 - 97 fL Final   No results found for: PLTCOUNTKUC, LABPLAT, POCPLA RDW  Date Value Ref Range Status  04/03/2020 13.8 11.0 - 15.0 % Final  05/03/2019 13.3 11.7 - 15.4 % Final         Passed - B12 Level in normal range and within 720 days    Vitamin B-12  Date Value Ref Range Status  05/08/2020 313 200 - 1,100 pg/mL Final    Comment:    . Please Note: Although the reference range for vitamin B12 is [phone] pg/mL, it has been reported that between 5 and 10% of patients with values between 200 and 400 pg/mL may experience neuropsychiatric and hematologic abnormalities due to occult B12 deficiency; less than 1% of patients with values above 400 pg/mL will have symptoms. Marland Kitchen

## 2021-07-22 NOTE — Telephone Encounter (Signed)
Pharmacy faxed a refill request for methocarbamol (ROBAXIN) 500 MG tablet [[id number]]    Order Details Dose, Route, Frequency: As Directed  Dispense Quantity: 120 tablet Refills: 2        Sig: TAKE 1 TABLET BY MOUTH EVERY 6 HOURS AS NEEDED FOR MUSCLE SPASM       Start Date: 07/22/21 End Date: --  Written Date: 07/22/21 Expiration Date: 07/22/22

## 2021-07-25 MED ORDER — HYDROCODONE-ACETAMINOPHEN 5-325 MG PO TABS: 1.0000 | ORAL_TABLET | Freq: Four times a day (QID) | ORAL | 0 refills | Status: DC | PRN

## 2021-07-30 ENCOUNTER — Encounter: Payer: Self-pay | Admitting: Family Medicine

## 2021-07-30 ENCOUNTER — Other Ambulatory Visit: Payer: Self-pay

## 2021-07-30 ENCOUNTER — Ambulatory Visit (INDEPENDENT_AMBULATORY_CARE_PROVIDER_SITE_OTHER): Payer: 59 | Admitting: Family Medicine

## 2021-07-30 VITALS — BP 120/74 | HR 78 | Ht 62.0 in | Wt 179.0 lb

## 2021-07-30 DIAGNOSIS — E1159 Type 2 diabetes mellitus with other circulatory complications: Secondary | ICD-10-CM

## 2021-07-30 DIAGNOSIS — N1831 Chronic kidney disease, stage 3a: Secondary | ICD-10-CM

## 2021-07-30 DIAGNOSIS — I10 Essential (primary) hypertension: Secondary | ICD-10-CM

## 2021-07-30 DIAGNOSIS — I2510 Atherosclerotic heart disease of native coronary artery without angina pectoris: Secondary | ICD-10-CM

## 2021-07-30 MED ORDER — METFORMIN HCL 1000 MG PO TABS: 1000.0000 mg | ORAL_TABLET | Freq: Two times a day (BID) | ORAL | 0 refills | Status: DC

## 2021-07-30 MED ORDER — SITAGLIPTIN PHOSPHATE 100 MG PO TABS: ORAL_TABLET | ORAL | 0 refills | Status: DC

## 2021-07-30 MED ORDER — ISOSORBIDE MONONITRATE ER 60 MG PO TB24: ORAL_TABLET | ORAL | 2 refills | Status: DC

## 2021-07-30 MED ORDER — CLOPIDOGREL BISULFATE 75 MG PO TABS: ORAL_TABLET | ORAL | 3 refills | Status: DC

## 2021-07-30 MED ORDER — BISOPROLOL FUMARATE 5 MG PO TABS: ORAL_TABLET | ORAL | 3 refills | Status: DC

## 2021-07-30 MED ORDER — PROMETHAZINE HCL 25 MG PO TABS: 25.0000 mg | ORAL_TABLET | Freq: Three times a day (TID) | ORAL | 0 refills | Status: DC | PRN

## 2021-07-30 MED ORDER — PANCRELIPASE (LIP-PROT-AMYL) 36000-114000 UNITS PO CPEP
ORAL_CAPSULE | ORAL | 11 refills | Status: DC
  Filled 2021-11-09: qty 240, 30d supply, fill #0
  Filled 2021-12-16: qty 240, 30d supply, fill #1
  Filled 2022-01-23: qty 240, 30d supply, fill #2
  Filled 2022-03-02: qty 240, 30d supply, fill #3
  Filled 2022-04-11: qty 240, 30d supply, fill #4
  Filled 2022-05-25: qty 240, 30d supply, fill #5
  Filled 2022-07-04: qty 240, 30d supply, fill #6

## 2021-07-30 MED ORDER — PIOGLITAZONE HCL 30 MG PO TABS: ORAL_TABLET | ORAL | 3 refills | Status: DC

## 2021-07-30 NOTE — Progress Notes (Signed)
Subjective:    Patient ID: [name], female    DOB: 08-May-1955, 66 y.o.   MRN: [id number] Patient is here today for follow-up on her diabetes.  She states that her fasting blood sugars are typically ranging between 90 and 113.  She denies any hypoglycemic episodes.  She denies any polyuria polydipsia or blurry vision.  She denies any burning pain in her feet.  She does have a history of peripheral vascular disease however she has palpable dorsalis pedis pulses and posterior tibialis pulses bilaterally and she denies any symptoms of claudication.  Her blood pressure today is well controlled at 120/74. Past Medical History:  Diagnosis Date   Aortic atherosclerosis (HCC)    Asthma    CAD (coronary artery disease)    a. LHC 2011: Diffuse distal and branch vessel disease - patient managed medically, no interventional options. b. Nuc 03/2014 - low risk, no ischemia.   Complication of anesthesia    slow to wake up with last surgery in 2011    Diabetes mellitus with circulatory complication (HCC)    Diverticulosis    Dyslipidemia    GERD (gastroesophageal reflux disease)    Headache    hx of migraines    Hypertension    Osteoarthritis    severe right knee  R TKR   PAD (peripheral artery disease) (HCC)    a. Severe stenosis mid right SFA s/p atherectomy 03/25/09. b. peripheral angiography in 12/2011 which showed only about 50% diffuse right SFA stenosis.   Pancreatitis 2003   Renal artery stenosis (HCC)    a. LHC 2011 - 40-50% left RAS.   Tobacco use disorder    quit 11/10   Past Surgical History:  Procedure Laterality Date   ABDOMINAL AORTAGRAM N/A 12/21/2011   Procedure: ABDOMINAL AORTAGRAM;  Surgeon: Iran Ouch, MD;  Location: [facility] CATH [facility];  Service: Cardiovascular;  Laterality: N/A;   ABDOMINAL AORTOGRAM W/LOWER EXTREMITY Bilateral 05/08/2019   Procedure: ABDOMINAL AORTOGRAM W/LOWER EXTREMITY;  Surgeon: Iran Ouch, MD;  Location: [facility] INVASIVE CV LAB;  Service:  Cardiovascular;  Laterality: Bilateral;   CARDIAC CATHETERIZATION     ENDARTERECTOMY FEMORAL Right 10/10/2019   Procedure: RIGHT FEMORAL ENDARTERECTOMY;  Surgeon: Cephus Shelling, MD;  Location: [facility] OR;  Service: Vascular;  Laterality: Right;   ESOPHAGOGASTRODUODENOSCOPY (EGD) WITH PROPOFOL N/A 10/05/2017   Procedure: ESOPHAGOGASTRODUODENOSCOPY (EGD) WITH PROPOFOL;  Surgeon: Rachael Fee, MD;  Location: [facility] ENDOSCOPY;  Service: Endoscopy;  Laterality: N/A;   EUS N/A 10/05/2017   Procedure: UPPER ENDOSCOPIC ULTRASOUND (EUS) RADIAL;  Surgeon: Rachael Fee, MD;  Location: [facility] ENDOS[facility];  Service: Endoscopy;  Laterality: N/A;   FOOT SURGERY Left    LOWER EXTREMITY ANGIOGRAM Bilateral 05/27/2015   Procedure: Lower Extremity Angiogram;  Surgeon: Iran Ouch, MD;  Location: [facility];  Service: Cardiovascular;  Laterality: Bilateral;   PATCH ANGIOPLASTY Right 10/10/2019   Procedure: PATCH ANGIOPLASTY USING Livia Snellen BIOLOGIC PATCH;  Surgeon: Cephus Shelling, MD;  [facility];  Service: Vascular;  Laterality: Right;   PERIPHERAL VASCULAR ATHERECTOMY Left 05/08/2019   Procedure: PERIPHERAL VASCULAR ATHERECTOMY;  Surgeon: Iran Ouch, MD;  Location: [facility];  Service: Cardiovascular;  Laterality: Left;   PERIPHERAL VASCULAR CATHETERIZATION N/A 05/27/2015   Procedure: Abdominal Aortogram;  Surgeon: Iran Ouch, MD;  Location: [facility];  Service: Cardiovascular;  Laterality: N/A;   PERIPHERAL VASCULAR CATHETERIZATION Right 05/27/2015   Procedure: Peripheral Vascular Intervention;  Surgeon: Chelsea Aus  Kirke Corin, MD;  Location: [facility];  Service: Cardiovascular;  Laterality: Right;  SFA   TOTAL KNEE ARTHROPLASTY Right 2011   TOTAL KNEE ARTHROPLASTY Left 05/22/2014   Procedure: LEFT TOTAL KNEE ARTHROPLASTY;  Surgeon: Ranee Gosselin, MD;  Location: [facility] ORS;  Service: Orthopedics;  Laterality: Left;   TRIGGER FINGER RELEASE Left 07/07/2021   Procedure:  LEFT INDEX FINGER RELEASE TRIGGER FINGER/A-1 PULLEY / CARPOMETACARPAL INJECTION;  Surgeon: Marlyne Beards, MD;  Location: [facility];  Service: Orthopedics;  Laterality: Left;   TUBAL LIGATION  1980   Current Outpatient Medications on File Prior to Visit  Medication Sig Dispense Refill   acetaminophen (TYLENOL) 500 MG tablet Take 1,000 mg by mouth every 6 (six) hours as needed for moderate pain or headache.     albuterol (VENTOLIN HFA) 108 (90 Base) MCG/ACT inhaler INHALE 2 PUFFS INTO THE LUNGS EVERY 6 HOURS AS NEEDED FOR WHEEZING OR SHORTNESS OF BREATH 18 g 3   Biotin w/ Vitamins C & E (HAIR/SKIN/NAILS PO) Take 1 tablet by mouth daily.     bisoprolol (ZEBETA) 5 MG tablet TAKE 1/2 TABLET(2.5 MG) BY MOUTH DAILY 45 tablet 3   clopidogrel (PLAVIX) 75 MG tablet TAKE 1 TABLET(75 MG) BY MOUTH DAILY 90 tablet 3   diclofenac sodium (VOLTAREN) 1 % GEL Apply 2 g topically 4 (four) times daily. (Patient taking differently: Apply 2 g topically 4 (four) times daily as needed (for hand pain).) 100 g 0   fluticasone-salmeterol (ADVAIR DISKUS) 250-50 MCG/ACT AEPB Inhale 1 puff into the lungs in the morning and at bedtime. 60 each 11   glucose blood (CONTOUR NEXT TEST) test strip 1 each by Other route 3 (three) times daily. Use as instructed 100 each 12   HYDROcodone-acetaminophen (NORCO/VICODIN) 5-325 MG tablet Take 1 tablet by mouth every 6 (six) hours as needed for moderate pain. Chronic Pain. Dx: G89.4 130 tablet 0   isosorbide mononitrate (IMDUR) 60 MG 24 hr tablet TAKE 1 TABLET(60 MG) BY MOUTH DAILY 90 tablet 2   JANUVIA 100 MG tablet TAKE 1 TABLET(100 MG) BY MOUTH DAILY 30 tablet 0   lipase/protease/amylase (CREON) 36000 UNITS CPEP capsule Take 2 capsules (72,000 Units total) by mouth 3 (three) times daily with meals. May also take 1 capsule (36,000 Units total) as needed (with snacks). 240 capsule 11   metFORMIN (GLUCOPHAGE) 1000 MG tablet TAKE 1 TABLET BY MOUTH TWICE DAILY WITH MEALS 60  tablet 0   methocarbamol (ROBAXIN) 500 MG tablet TAKE 1 TABLET BY MOUTH EVERY 6 HOURS AS NEEDED FOR MUSCLE SPASM 120 tablet 2   Multiple Vitamin (MULTIVITAMIN) tablet Take 1 tablet by mouth daily.     nitroGLYCERIN (NITROSTAT) 0.4 MG SL tablet Place 1 tablet under the tongue every 5 (five) minutes as needed for chest pain.     ondansetron (ZOFRAN) 4 MG tablet Take 1 tablet (4 mg total) by mouth every 8 (eight) hours as needed for nausea or vomiting. 21 tablet 1   pantoprazole (PROTONIX) 40 MG tablet TAKE 1 TABLET BY MOUTH DAILY 90 tablet 3   pioglitazone (ACTOS) 30 MG tablet TAKE 1 TABLET(30 MG) BY MOUTH DAILY 90 tablet 3   pregabalin (LYRICA) 50 MG capsule Take 1 capsule (50 mg total) by mouth 3 (three) times daily. 90 capsule 3   Probiotic Product (TRUBIOTICS PO) Take 1 capsule by mouth daily.     promethazine (PHENERGAN) 25 MG tablet Take 1 tablet (25 mg total) by mouth every 8 (eight) hours as  needed for nausea or vomiting. 20 tablet 0   rosuvastatin (CRESTOR) 40 MG tablet TAKE 1 TABLET(40 MG) BY MOUTH EVERY NIGHT 90 tablet 3   vitamin B-12 (CYANOCOBALAMIN) 1000 MCG tablet Take 1,000 mcg by mouth daily.     Current Facility-Administered Medications on File Prior to Visit  Medication Dose Route Frequency Provider Last Rate Last Admin   triamcinolone acetonide (KENALOG) 10 MG/ML injection 10 mg  10 mg Other Once Regal, Norman S, DPM       Allergies  Allergen Reactions   Gabapentin Other (See Comments)    Brain shakes   Glucotrol [Glipizide] Rash and Other (See Comments)    Red rash   Influenza Vaccines Other (See Comments)    Significant arm soreness requiring 1 year of physical therapy   Latex Rash   Nortriptyline Other (See Comments)    BRAIN SHAKE   Tetanus Toxoid Hives, Swelling, Rash and Other (See Comments)    Swelling to site of injection with rash and fever   Other Itching, Other (See Comments) and Cough    Patient is highly allergic to Cats and she begins to break out in  rash and turns red if someone caring for her has cats at home.  Patient takes Claritin for this type of reaction.  Patient stated that if it goes untreated then he begins to have difficulty breathing   Invokana [Canagliflozin] Other (See Comments)    Yeast Infections   Social History   Socioeconomic History   Marital status: Married    Spouse name: Not on file   Number of children: Not on file   Years of education: 10   Highest education level: Not on file  Occupational History   Not on file  Tobacco Use   Smoking status: Former    Types: Cigarettes    Quit date: 12/13/2008    Years since quitting: 12.6   Smokeless tobacco: Never  Vaping Use   Vaping Use: Never used  Substance and Sexual Activity   Alcohol use: No    Comment: Quit drinking around year 2000   Drug use: No    Comment: Patient quit smoking around 2011   Sexual activity: Yes    Partners: Male    Birth control/protection: Post-menopausal  Other Topics Concern   Not on file  Social History Narrative   Denies any IV drug use or marijuana use.  Former smoker for 30 years, quit Nov. 2010. She is married with 2 children. Disabled from knee, not working.   Social Determinants of Health   Financial Resource Strain: Medium Risk (09/28/2018)   Overall Financial Resource Strain (CARDIA)    Difficulty of Paying Living Expenses: Somewhat hard  Food Insecurity: Not on file  Transportation Needs: No Transportation Needs (09/28/2018)   PRAPARE - Administrator, Civil Service (Medical): No    Lack of Transportation (Non-Medical): No  Physical Activity: Not on file  Stress: Not on file  Social Connections: Unknown (09/28/2018)   Social Connection and Isolation Panel [NHANES]    Frequency of Communication with Friends and Family: Once a week    Frequency of Social Gatherings with Friends and Family: Once a week    Attends Religious Services: Not on Insurance claims handler of Clubs or Organizations: Not on file     Attends Banker Meetings: Not on file    Marital Status: Married  Intimate Partner Violence: Not At Risk (09/28/2018)   Humiliation, Afraid, Rape, and  Kick questionnaire    Fear of Current or Ex-Partner: No    Emotionally Abused: No    Physically Abused: No    Sexually Abused: No   Family History  Problem Relation Age of Onset   CVA Mother    Emphysema Father    Breast cancer Sister 65   Colon cancer Neg Hx      Review of Systems  All other systems reviewed and are negative.      Objective:   Physical Exam Vitals reviewed.  Constitutional:      General: She is not in acute distress.    Appearance: She is well-developed. She is not diaphoretic.  Eyes:     Conjunctiva/sclera: Conjunctivae normal.  Neck:     Vascular: No carotid bruit or JVD.  Cardiovascular:     Rate and Rhythm: Normal rate and regular rhythm.     Heart sounds: Normal heart sounds. No murmur heard. Pulmonary:     Effort: Pulmonary effort is normal.     Breath sounds: Normal breath sounds. No wheezing or rales.  Chest:     Chest wall: No tenderness.  Abdominal:     General: Bowel sounds are normal. There is no distension.     Palpations: Abdomen is soft. There is no mass.     Tenderness: There is no abdominal tenderness. There is no guarding or rebound.  Musculoskeletal:     Cervical back: Neck supple.     Right lower leg: No edema.     Left lower leg: No edema.     Left foot: Tenderness and bony tenderness present.  Skin:    Findings: No erythema or rash.  Neurological:     Mental Status: She is alert and oriented to person, place, and time.     Cranial Nerves: No cranial nerve deficit.     Motor: No abnormal muscle tone.     Coordination: Coordination normal.     Deep Tendon Reflexes: Reflexes are normal and symmetric.         Assessment & Plan:  Type 2 diabetes mellitus with other circulatory complication, without long-term current use of insulin (HCC) - Plan: Hemoglobin  A1c, CBC with Differential/Platelet, Lipid panel, Microalbumin, urine, COMPLETE METABOLIC PANEL WITH GFR  Coronary artery disease involving native heart without angina pectoris, unspecified vessel or lesion type - Plan: Hemoglobin A1c, CBC with Differential/Platelet, Lipid panel, Microalbumin, urine, COMPLETE METABOLIC PANEL WITH GFR Her blood pressure today is excellent.  Check a CBC, CMP, lipid panel.  Her goal LDL cholesterol is less than 70.  Check an A1c.  Her goal A1c is less than 6.5.  I would prefer to have the patient off of pioglitazone and replace with Jardiance due to the cardiovascular benefits of the medication.  She had a previous history of yeast infections on Invokana and she is not sure she can afford the cost of the [place].  She will check on the price and let me know her decision.  Otherwise we will make no changes at the present time.

## 2021-07-30 NOTE — Telephone Encounter (Signed)
Pt was seen today. Per pt Dr. Tanya Nones, refill all pts meds as needed. Rx's sent to pharmacy

## 2021-07-31 LAB — COMPLETE METABOLIC PANEL WITH GFR
AG Ratio: 2 (calc) (ref 1.0–2.5)
ALT: 16 U/L (ref 6–29)
AST: 17 U/L (ref 10–35)
Albumin: 4.2 g/dL (ref 3.6–5.1)
Alkaline phosphatase (APISO): 81 U/L (ref 37–153)
BUN: 23 mg/dL (ref 7–25)
CO2: 23 mmol/L (ref 20–32)
Calcium: 9.4 mg/dL (ref 8.6–10.4)
Chloride: 105 mmol/L (ref 98–110)
Creat: 1.05 mg/dL (ref 0.50–1.05)
Globulin: 2.1 g/dL (calc) (ref 1.9–3.7)
Glucose, Bld: 124 mg/dL — ABNORMAL HIGH (ref 65–99)
Potassium: 4.4 mmol/L (ref 3.5–5.3)
Sodium: 138 mmol/L (ref 135–146)
Total Bilirubin: 0.3 mg/dL (ref 0.2–1.2)
Total Protein: 6.3 g/dL (ref 6.1–8.1)
eGFR: 59 mL/min/{1.73_m2} — ABNORMAL LOW (ref >=60)

## 2021-07-31 LAB — MICROALBUMIN, URINE: Microalb, Ur: 0.4 mg/dL

## 2021-07-31 LAB — CBC WITH DIFFERENTIAL/PLATELET
Absolute Monocytes: 395 cells/uL (ref 200–950)
Basophils Absolute: 60 cells/uL (ref 0–200)
Basophils Relative: 1.2 %
Eosinophils Absolute: 275 cells/uL (ref 15–500)
Eosinophils Relative: 5.5 %
HCT: 38.6 % (ref 35.0–45.0)
Hemoglobin: 12.4 g/dL (ref 11.7–15.5)
Lymphs Abs: 1415 cells/uL (ref 850–3900)
MCH: 28.1 pg (ref 27.0–33.0)
MCHC: 32.1 g/dL (ref 32.0–36.0)
MCV: 87.5 fL (ref 80.0–100.0)
MPV: 9.7 fL (ref 7.5–12.5)
Monocytes Relative: 7.9 %
Neutro Abs: 2855 cells/uL (ref 1500–7800)
Neutrophils Relative %: 57.1 %
Platelets: 184 10*3/uL (ref 140–400)
RBC: 4.41 10*6/uL (ref 3.80–5.10)
RDW: 12.9 % (ref 11.0–15.0)
Total Lymphocyte: 28.3 %
WBC: 5 10*3/uL (ref 3.8–10.8)

## 2021-07-31 LAB — LIPID PANEL
Cholesterol: 159 mg/dL (ref <200)
HDL: 53 mg/dL (ref >=50)
LDL Cholesterol (Calc): 75 mg/dL (calc)
Non-HDL Cholesterol (Calc): 106 mg/dL (calc) (ref <130)
Total CHOL/HDL Ratio: 3 (calc) (ref <5.0)
Triglycerides: 224 mg/dL — ABNORMAL HIGH (ref <150)

## 2021-07-31 LAB — HEMOGLOBIN A1C
Hgb A1c MFr Bld: 6.3 % of total Hgb — ABNORMAL HIGH (ref <5.7)
Mean Plasma Glucose: 134 mg/dL
eAG (mmol/L): 7.4 mmol/L

## 2021-08-04 ENCOUNTER — Other Ambulatory Visit: Payer: Self-pay

## 2021-08-04 NOTE — Patient Instructions (Addendum)
Goals Addressed             This Visit's Progress    Aging Gracefully Patient will verbalize DM management strategies over the next 4 months       Aging Gracefully RN  08/04/21 Assessment: Per Client, received report from provider office that A1C is good, but she does not know the level. Usually eats two meals/day. She does not eat breakfast. Antinausea medication for stomach issues. She Is very receptive to diabetes education reinforcement. Per chart A1C 6.3 on 07/30/61.  Interventions: Provide Diabetes booklet Encourage to take medications as prescribed Check blood sugar as prescribed and notify provider if consistently outside recommended range. Target 80-130 before meals and <180 after meals. Encouraged to contact provider with any questions or concerns  Kathyrn Sheriff, RN, MSN, BSN, CCM Aging Gracefully Care Management Coordinator [phone]

## 2021-08-04 NOTE — Patient Outreach (Signed)
Aging Gracefully Program  RN Visit  08/04/2021  [name] 1956-01-17 [id number]  Visit:  Initial Visit   Start Time: 0945   End Time:   11:45 Total Minutes:  2hr   Readiness To Change Score:  Readiness to Change Score: 10  Universal RN Interventions: Calendar Distribution: Yes (provided calendar and discussed how to use) Exercise Review: Yes (provided excerise booklet and demonstrated exercise) Medications: Yes Medication Changes: No Mood: No Pain: Yes (legs hurt all the time due to neuropathy, PAD,history of blood clots in legs) PCP Advocacy/Support: No (reports has a good provider relationship) Fall Prevention: No Incontinence: No Clinician View Of Client Situation: RN was Greeted at the door by [name]. Very pleasant. [name] is eager to do what she needs to do to help improve health. Client View Of His/Her Situation: Client reports she feels she has to do what she has to do when it comes to her health. She states she has good doctors and she reports a good support system.  Healthcare Provider Communication: Did Surveyor, mining With [employer] Provider?: No According to Client, Did PCP Report Communication With An Aging Gracefully RN?: No  Clinician View of Client Situation: Clinician View Of Client Situation: RN was Greeted at the door by [name]. Very pleasant. [name] is eager to do what she needs to do to help improve health. Client's View of His/Her Situation: Client View Of His/Her Situation: Client reports she feels she has to do what she has to do when it comes to her health. She states she has good doctors and she reports a good support system.  Medication Assessment: Do You Have Any Problems Paying For Medications?: Yes (sometimes. currently has medications) Where Does Client Store Medications?: Other: (bedroom) Can Client Read Pill Bottles?: Yes Does Client Use A Pillbox?: No Does Anyone Assist Client In Taking Medications?:  No Do You Take Vitamin D?: Yes (in a multiviatamin) Total Number Of Medications That The Client Takes: 16 Does Client Have Any Questions Or Concerns About Medictions?: No Is Client Complaining Of Any Symptoms That Could Be Side Effects To Medications?: No Any Possible Changes In Medication Regimen?: No  OT Update: n/a  Session Summary: Initial assessment completed. Medications reviewed. She reports she also has pancreatitis that also interferes with eating at times. She reports she rarely eats breakfast. She staes her A1C was good per provider office staff, but she does not know what her A1C level is. [name] is eager to have diabetes education/reinforcement of diabetes education.   Kathyrn Sheriff, RN, MSN, BSN, CCM Aging Gracefully Care Management Coordinator [phone]

## 2021-08-19 ENCOUNTER — Telehealth: Payer: Self-pay | Admitting: Family Medicine

## 2021-08-19 NOTE — Telephone Encounter (Signed)
Patient left vm requesting a refill on her pain medication. LOV 07/30/2021 HYDROcodone-acetaminophen (NORCO/VICODIN) 5-325 MG tablet [[id number]]    Order Details Dose: 1 tablet Route: Oral Frequency: Every 6 hours PRN for moderate pain  Dispense Quantity: 130 tablet Refills: 0        Sig: Take 1 tablet by mouth every 6 (six) hours as needed for moderate pain. Chronic Pain. Dx: G66.4       Start Date: 07/25/21 End Date: --  Written Date: 07/25/21 Expiration Date: 01/21/22  Earliest Fill Date: 07/25/21    Original Order:  HYDROcodone-acetaminophen (NORCO/VICODIN) 5-325 MG tablet [[id number]]  Providers  Authorizing Provider:   Donita Brooks, MD  [address] [place], [place] [address]  Phone:  [phone]   Fax:  [phone]  DEA #:  [id number]   NPI:  [id number]      Ordering User:  Donita Brooks, MD       Pharmacy  [facility] [phone] - [place], [place] - [address] AT [facility] OF [address]  [address], [place]

## 2021-08-20 ENCOUNTER — Telehealth: Payer: Self-pay | Admitting: Family Medicine

## 2021-08-20 ENCOUNTER — Other Ambulatory Visit: Payer: Self-pay | Admitting: Family Medicine

## 2021-08-20 MED ORDER — HYDROCODONE-ACETAMINOPHEN 5-325 MG PO TABS: 1.0000 | ORAL_TABLET | Freq: Four times a day (QID) | ORAL | 0 refills | Status: DC | PRN

## 2021-08-20 NOTE — Telephone Encounter (Signed)
Patient called in to let us know that she looses her insurance at this end of this month so she would like to hold off on trying the Jardiance since she isn't sure they will cover and she would like to wait until she figures out what type of insurance she will have.  CB# [phone]

## 2021-08-21 ENCOUNTER — Other Ambulatory Visit: Payer: Self-pay | Admitting: Family Medicine

## 2021-08-29 ENCOUNTER — Other Ambulatory Visit: Payer: Self-pay | Admitting: Family Medicine

## 2021-08-31 NOTE — Telephone Encounter (Signed)
Requested Prescriptions  Pending Prescriptions Disp Refills  . sitaGLIPtin (JANUVIA) 100 MG tablet [Pharmacy Med Name: JANUVIA 100MG  TABLETS] 90 tablet 1    Sig: TAKE 1 TABLET(100 MG) BY MOUTH DAILY     Endocrinology:  Diabetes - DPP-4 Inhibitors Failed - 08/29/2021  8:58 AM      Failed - Valid encounter within last 6 months    Recent Outpatient Visits          7 months ago Type 2 diabetes mellitus with diabetic neuropathy, unspecified whether long term insulin use (HCC)   08/31/2021 [facility] [place], [employer], MD   1 year ago Type 2 diabetes mellitus with diabetic neuropathy, unspecified whether long term insulin use (HCC)   [facility] [place], [facility], MD   1 year ago Gynecologic exam normal   [facility], NP   1 year ago Type 2 diabetes mellitus with other circulatory complication, without long-term current use of insulin (HCC)   [facility] [place], [facility], MD   1 year ago Injury of left toe, initial encounter   [facility] [place], [place], MD      Future Appointments            In 2 months Arida, Velna Hatchet, MD [facility], [facility]           Passed - HBA1C is between 0 and 7.9 and within 180 days    Hgb A1c MFr Bld  Date Value Ref Range Status  07/30/2021 6.3 (H) <5.7 % of total Hgb Final    Comment:    For someone without known diabetes, a hemoglobin  A1c value between 5.7% and 6.4% is consistent with prediabetes and should be confirmed with a  follow-up test. . For someone with known diabetes, a value <7% indicates that their diabetes is well controlled. A1c targets should be individualized based on duration of diabetes, age, comorbid conditions, and other considerations. . This assay result is consistent with an increased risk of diabetes. . Currently, no consensus exists regarding use of hemoglobin A1c for diagnosis of diabetes for children. .           Passed - Cr in normal range and within 360 days    Creat  Date Value Ref Range Status  07/30/2021 1.05 0.50 - 1.05 mg/dL Final   Creatinine, Urine  Date Value Ref Range Status  02/18/2019 16 (L) 20 - 275 mg/dL Final

## 2021-09-01 ENCOUNTER — Other Ambulatory Visit: Payer: Self-pay

## 2021-09-01 NOTE — Patient Outreach (Signed)
Aging Gracefully Program  RN Visit  09/01/2021  Zuri Roquel Burgin May 31, 1955 [id number]  Visit:   AG RN home visit #2  Start Time:   0930 End Time:   1030 Total Minutes:   60  Readiness To Change Score:     Universal RN Interventions: Calendar Distribution: Yes Exercise Review: Yes Medications: Yes Medication Changes: No Mood: Yes Pain: Yes PCP Advocacy/Support: No Fall Prevention: Yes Incontinence: No Clinician View Of Client Situation: Patient met me outside mobile home. Home neat and clean.  Ambualating  without difficulty Client View Of His/Her Situation: Client reports that she is doing well. Reports she is managing her DM well .  Reports CBG of 80-100.  Reports A1c is good. Unable to provide me numbers.  Denies any changes to medications.  Reports she is not follow a special diet,, states that she watches her carbs.  Reports her feet are in good shape.  Healthcare Provider Communication: Did Higher education careers adviser With [employer] Provider?: No According to Client, Did PCP Report Communication With An Aging Gracefully RN?: No  Clinician View of Client Situation: Clinician View Of Client Situation: Patient met me outside mobile home. Home neat and clean.  Ambualating  without difficulty Client's View of His/Her Situation: Client View Of His/Her Situation: Client reports that she is doing well. Reports she is managing her DM well .  Reports CBG of 80-100.  Reports A1c is good. Unable to provide me numbers.  Denies any changes to medications.  Reports she is not follow a special diet,, states that she watches her carbs.  Reports her feet are in good shape.  Medication Assessment: denies any changes to medications    OT Update: pending community housing solutions contracts  Session Summary:Patient doing well with DM at this time. No recent falls.  Reviewed goals.   Goals Addressed             This Visit's Progress    Aging Gracefully Patient will verbalize DM  management strategies over the next 4 months       Aging Gracefully RN  08/04/21 Assessment: Per Client, received report from provider office that A1C is good, but she does not know the level. Usually eats two meals/day. She does not eat breakfast. Antinausea medication for stomach issues. She Is very receptive to diabetes education reinforcement. Per chart A1C 6.3 on 07/30/61.  Interventions: Provide Diabetes booklet Encourage to take medications as prescribed Check blood sugar as prescribed and notify provider if consistently outside recommended range. Target 80-130 before meals and <180 after meals. Encouraged to contact provider with any questions or concerns  Thea Silversmith, RN, MSN, BSN, CCM Aging Gracefully Care Management Coordinator [phone]   09/01/2021 Assessment:  Reviewed current CBG readings of 80-100. Reports taking medications as prescribed. Exercising daily.  Interventions:  Reviewed home exercise plan. Encouraged patient to do her exercises daily for increase strength of her legs.  Reviewed precautions of checking feet.  Plan: follow up home visit planned in 1 month.  Tomasa Rand RN, BSN, Careers information officer for [employer] Mobile: [phone]        Tomasa Rand RN, BSN, Careers information officer for [employer] Mobile: [phone]

## 2021-09-01 NOTE — Patient Instructions (Signed)
Visit Information  Thank you for taking time to visit with me today. Please don't hesitate to contact me if I can be of assistance to you before our next scheduled telephone appointment.  Following are the goals we discussed today:   Goals Addressed             This Visit's Progress    Aging Gracefully Patient will verbalize DM management strategies over the next 4 months       Aging Gracefully RN  08/04/21 Assessment: Per Client, received report from provider office that A1C is good, but she does not know the level. Usually eats two meals/day. She does not eat breakfast. Antinausea medication for stomach issues. She Is very receptive to diabetes education reinforcement. Per chart A1C 6.3 on 07/30/61.  Interventions: Provide Diabetes booklet Encourage to take medications as prescribed Check blood sugar as prescribed and notify provider if consistently outside recommended range. Target 80-130 before meals and <180 after meals. Encouraged to contact provider with any questions or concerns  Kathyrn Sheriff, RN, MSN, BSN, CCM Aging Gracefully Care Management Coordinator [phone]   09/01/2021 Assessment:  Reviewed current CBG readings of 80-100. Reports taking medications as prescribed. Exercising daily.  Interventions:  Reviewed home exercise plan. Encouraged patient to do her exercises daily for increase strength of her legs.  Reviewed precautions of checking feet.  Plan: follow up home visit planned in 1 month.  Rowe Pavy RN, BSN, CEN RN Case Production designer, theatre/television/film for Aging Gracefully [facility] Mobile: [phone]          Our next appointment is  in person  on 10/06/2021 at 0930  Please call the care guide team at [phone] if you need to cancel or reschedule your appointment.   If you are experiencing a Mental Health or Behavioral Health Crisis or need someone to talk to, please call the Suicide and Crisis Lifeline: 988 call the [place] National Suicide Prevention  Lifeline: (786) 101-6820 or TTY: 628-779-0255 TTY 867-221-5005) to talk to a trained counselor call 1-800-273-TALK (toll free, 24 hour hotline) call 911   The patient verbalized understanding of instructions, educational materials, and care plan provided today and agreed to receive a mailed copy of patient instructions, educational materials, and care plan.   Rowe Pavy RN, BSN, Careers adviser for [employer] Mobile: [phone]

## 2021-09-08 ENCOUNTER — Other Ambulatory Visit: Payer: Self-pay | Admitting: Family Medicine

## 2021-09-09 NOTE — Telephone Encounter (Signed)
Requested medication (s) are due for refill today: Yes  Requested medication (s) are on the active medication list: yes  Last refill:  07/30/21  Future visit scheduled: No  Notes to clinic:  See request.    Requested Prescriptions  Pending Prescriptions Disp Refills   promethazine (PHENERGAN) 25 MG tablet [Pharmacy Med Name: PROMETHAZINE 25MG  TABLETS] 20 tablet 0    Sig: TAKE 1 TABLET(25 MG) BY MOUTH EVERY 8 HOURS AS NEEDED FOR NAUSEA OR VOMITING     Not Delegated - Gastroenterology: Antiemetics Failed - 09/08/2021  6:05 PM      Failed - This refill cannot be delegated      Failed - Valid encounter within last 6 months    Recent Outpatient Visits           8 months ago Type 2 diabetes mellitus with diabetic neuropathy, unspecified whether long term insulin use (HCC)   09/10/2021 Family Medicine [employer], MD   1 year ago Type 2 diabetes mellitus with diabetic neuropathy, unspecified whether long term insulin use (HCC)   [facility] [place], [facility], MD   1 year ago Gynecologic exam normal   [facility], NP   1 year ago Type 2 diabetes mellitus with other circulatory complication, without long-term current use of insulin (HCC)   [facility] [place], [facility], MD   1 year ago Injury of left toe, initial encounter   [facility] Medicine [place], [place], MD       Future Appointments             In 2 months Velna Hatchet, Kirke Corin, MD [facility] [place], [facility]

## 2021-09-13 NOTE — Telephone Encounter (Signed)
Patient called to request a refill and to check the status of the previous request on her phenergan. Please advise, patient was last seen on 07/30/2021.   She is also calling in for a refill on her   HYDROcodone-acetaminophen (NORCO/VICODIN) 5-325 MG tablet [[id number]]    Order Details Dose: 1 tablet Route: Oral Frequency: Every 6 hours PRN for moderate pain  Dispense Quantity: 130 tablet Refills: 0        Sig: Take 1 tablet by mouth every 6 (six) hours as needed for moderate pain. Chronic Pain. Dx: G65.4       Start Date: 08/20/21 End Date: --  Written Date: 08/20/21 Expiration Date: 02/16/22  Earliest Fill Date: 08/20/21    Original Order:  HYDROcodone-acetaminophen (NORCO/VICODIN) 5-325 MG tablet [[id number]]  Providers  Authorizing Provider:   Donita Brooks, MD  [address] [place], [place] [address]  Phone:  [phone]   Fax:  [phone]  DEA #:  [id number]   NPI:  [id number]      Ordering User:  Donita Brooks, MD       Pharmacy  [facility] [phone] - [place], [place] - [address] AT [facility] OF [address] & [address]

## 2021-09-17 ENCOUNTER — Other Ambulatory Visit: Payer: Self-pay | Admitting: Family Medicine

## 2021-09-17 ENCOUNTER — Telehealth: Payer: Self-pay

## 2021-09-17 ENCOUNTER — Other Ambulatory Visit: Payer: Self-pay

## 2021-09-17 ENCOUNTER — Other Ambulatory Visit: Payer: Self-pay | Admitting: Occupational Therapy

## 2021-09-17 MED ORDER — HYDROCODONE-ACETAMINOPHEN 5-325 MG PO TABS: 1.0000 | ORAL_TABLET | Freq: Four times a day (QID) | ORAL | 0 refills | Status: DC | PRN

## 2021-09-17 NOTE — Telephone Encounter (Signed)
Pt has changed insurance to [place] and they are [facility]. Pt asks [facility] [address]? Thank you.

## 2021-09-17 NOTE — Telephone Encounter (Signed)
LOV 07/30/21 Last refill 08/20/21, #130, 0 refills  Please review, thanks!

## 2021-09-18 NOTE — Patient Instructions (Signed)
She would like to be able to get her LB dressed more easily (sock aid, reacher, long shoe horn, dressing stick). MET  Action Planning--Dressing Target Problem Area: Intermittent trouble with LBD  Why Problem May Occur: Increased pain, decreased bend in knees due to knee surgeries.  Target Goal: Independence/increased ease on all days for LBD no matter what her pain may be on a given day   STRATEGIES Saving Your Energy: DO: DON'T:  Sit down to get dressed (standing takes more energy) Stand too long while dressing  Use appropriate adaptive equipment:  long handled shoehorn, sock aide, reacher   Keep all items you'll need within easy reach    Simplifying the way you set up tasks or daily routines: DO: DON'T:  Gather all items before getting started--so you are not up and down a lot when working on getting dressed.   Wear clothing that is easily manipulated    Practice It is important to practice the strategies so we can determine if they will be effective in helping to reach your goal. Follow these specific recommendations: 1.Use the AE regularly so you get used to using it  If a strategy does not work the first time, try it again and again (and maybe again). We may make some changes over the next few sessions, based on how they work.  Golden Circle, OTR/L       09/17/2021

## 2021-09-18 NOTE — Patient Outreach (Signed)
Aging Gracefully Program  OT Follow-Up Visit  09/18/2021  [name] 1955/03/30 [id number]  Visit:  2- Second Visit  Start Time:  1620 End Time:  2751 Total Minutes:  30  Readiness to Change Score :  Readiness to Change Score: 10  Durable Medical Equipment: Adaptive Equipment: Long Handled Shoehorn, Long Handled Sponge, Sock Aid, Designer, multimedia Equipment Distribution Date: 09/17/21   Goals:   Goals Addressed             This Visit's Progress    Patient Stated       She would like to be able to get her LB dressed more easily (sock aid, reacher, long shoe horn, dressing stick). MET  Action Planning--Dressing Target Problem Area: Intermittent trouble with LBD  Why Problem May Occur: Increased pain, decreased bend in knees due to knee surgeries.  Target Goal: Independence/increased ease on all days for LBD no matter what her pain may be on a given day   STRATEGIES Saving Your Energy: DO: DON'T:  Sit down to get dressed (standing takes more energy) Stand too long while dressing  Use appropriate adaptive equipment:  long handled shoehorn, sock aide, reacher   Keep all items you'll need within easy reach   Simplifying the way you set up tasks or daily routines: DO: DON'T:  Gather all items before getting started--so you are not up and down a lot when working on getting dressed.   Wear clothing that is easily manipulated    Practice It is important to practice the strategies so we can determine if they will be effective in helping to reach your goal. Follow these specific recommendations: 1.Use the AE regularly so you get used to using it  If a strategy does not work the first time, try it again and again (and maybe again). We may make some changes over the next few sessions, based on how they work.  Golden Circle, OTR/L       09/17/2021        Post Clinical Reasoning: Client Action (Goal) One Interventions: Pt provided with AE for LBD Did Client  Try?: Yes Targeted Problem Area Status: A Little Better Clinician View Of Client Situation:: [name] continues to do well despite her medical issues.Her biggest concern currently is getting all of their finances figured out due to drop in supplemental payments for Mr. Finamore and both of them having new medical insurances. Client View Of His/Her Situation:: She feels she is doing overall well. She was appreaciative of the AE. She and Mr. Krasowski like to do as much as they can for themselves and manage their health as best they can given changes in insurance and medication costs (she has been resourceful in the past of getting these lowered when needed).  Golden Circle, OTR/L Acute Rehab Services Aging Gracefully [phone] Office [phone]

## 2021-09-20 ENCOUNTER — Telehealth: Payer: Self-pay

## 2021-09-20 NOTE — Telephone Encounter (Signed)
Returned call to pt, LM on VM asking where does she want her med refills to be call into?

## 2021-09-20 NOTE — Telephone Encounter (Signed)
Pt called in stating that she needs a prior authorization for her pain med (HYDROcodone-acetaminophen (NORCO/VICODIN) 5-325 MG tablet). Pt also stated that she would now like for all meds to [facility] [address] [facility].  Cb#: [phone]

## 2021-09-23 ENCOUNTER — Telehealth: Payer: Self-pay

## 2021-09-23 NOTE — Telephone Encounter (Signed)
Pt called in stating that she needs a prior authorization for this med lipase/protease/amylase (CREON) 36000 UNITS CPEP capsule [[id number]]    Cb#: [phone]

## 2021-09-27 ENCOUNTER — Telehealth: Payer: Self-pay

## 2021-09-27 NOTE — Telephone Encounter (Signed)
[name] [employer]ford Craze - PA Case ID: [id number] - Rx #: [id number] Need help? Call us at [phone]   Outcome Approved today Your PA request has been approved. Additional information will be provided in the approval communication. (Message 1145) Drug Creon [id number] UNIT dr capsules

## 2021-09-27 NOTE — Telephone Encounter (Signed)
PA-Creon sent to plan

## 2021-09-28 NOTE — Telephone Encounter (Signed)
Called spoke with pt, advice pt that her Creon was approve by her insurance 09/27/21

## 2021-09-30 ENCOUNTER — Ambulatory Visit (INDEPENDENT_AMBULATORY_CARE_PROVIDER_SITE_OTHER): Payer: 59 | Admitting: Family Medicine

## 2021-09-30 VITALS — BP 138/72 | HR 91 | Temp 97.6°F | Ht 62.0 in | Wt 184.2 lb

## 2021-09-30 DIAGNOSIS — R0789 Other chest pain: Secondary | ICD-10-CM

## 2021-09-30 DIAGNOSIS — M549 Dorsalgia, unspecified: Secondary | ICD-10-CM

## 2021-09-30 MED ORDER — PREDNISONE 20 MG PO TABS
ORAL_TABLET | ORAL | 0 refills | Status: DC
Start: 2021-09-30 — End: 2021-11-23

## 2021-09-30 MED ORDER — PREDNISONE 20 MG PO TABS: ORAL_TABLET | ORAL | 0 refills | Status: DC

## 2021-09-30 NOTE — Progress Notes (Signed)
Subjective:    Patient ID: [name], female    DOB: [birth year], 66 y.o.   MRN: [id number]  Patient states that approximately 2 weeks ago, she may have twisted awkwardly and injured the center of the middle of her back.  Ever since that time she is having pain in the center of her back roughly around the level of T4.  The pain radiates into her chest.  It hurts worse when she lays down at night.  She feels a stabbing pain in the center of her back whenever she lays down that will radiate into her chest.  She states that it will literally take her breath away.  After she lays there for a while the pain will go away.  When she is up moving around during the day the pain is much better and manageable and sometimes goes away however lying down or sitting up out of bed causes intense pain.  She states it feels like something is shifting inside of her whenever she lies down.  She is tender to palpation of the spinous processes of T3 and T4.  She is also tender to palpation over the ribs adjacent to the spinous processes in that area.  She also has tenderness with palpation and pressure against her sternum.  She denies any cough or fever or chills or hemoptysis.  Her lungs are clear to auscultation bilaterally today.  There is no wheezes crackles or rails.  She denies any angina.  Her heart is in normal sinus rhythm today there are no murmurs.  Past Medical History:  Diagnosis Date   Aortic atherosclerosis (HCC)    Asthma    CAD (coronary artery disease)    a. LHC 2011: Diffuse distal and branch vessel disease - patient managed medically, no interventional options. b. Nuc 03/2014 - low risk, no ischemia.   Complication of anesthesia    slow to wake up with last surgery in 2011    Diabetes mellitus with circulatory complication (HCC)    Diverticulosis    Dyslipidemia    GERD (gastroesophageal reflux disease)    Headache    hx of migraines    Hypertension    Osteoarthritis    severe right  knee  R TKR   PAD (peripheral artery disease) (HCC)    a. Severe stenosis mid right SFA s/p atherectomy 03/25/09. b. peripheral angiography in 12/2011 which showed only about 50% diffuse right SFA stenosis.   Pancreatitis 2003   Renal artery stenosis (HCC)    a. LHC 2011 - 40-50% left RAS.   Tobacco use disorder    quit 11/10   Past Surgical History:  Procedure Laterality Date   ABDOMINAL AORTAGRAM N/A 12/21/2011   Procedure: ABDOMINAL AORTAGRAM;  Surgeon: Iran OuchMuhammad A Arida, MD;  Location: [facility];  Service: Cardiovascular;  Laterality: N/A;   ABDOMINAL AORTOGRAM W/LOWER EXTREMITY Bilateral 05/08/2019   Procedure: ABDOMINAL AORTOGRAM W/LOWER EXTREMITY;  Surgeon: Iran OuchArida, Muhammad A, MD;  Location: [facility];  Service: Cardiovascular;  Laterality: Bilateral;   CARDIAC CATHETERIZATION     ENDARTERECTOMY FEMORAL Right 10/10/2019   Procedure: RIGHT FEMORAL ENDARTERECTOMY;  Surgeon: Cephus Shellinglark, Christopher J, MD;  [facility];  Service: Vascular;  Laterality: Right;   ESOPHAGOGASTRODUODENOSCOPY (EGD) WITH PROPOFOL N/A 10/05/2017   Procedure: ESOPHAGOGASTRODUODENOSCOPY (EGD) WITH PROPOFOL;  Surgeon: Rachael FeeJacobs, Daniel P, MD;  Location: [facility];  Service: Endoscopy;  Laterality: N/A;   EUS N/A 10/05/2017   Procedure: UPPER ENDOSCOPIC ULTRASOUND (EUS) RADIAL;  Surgeon:  Rachael Fee, MD;  Location: [facility];  Service: Endoscopy;  Laterality: N/A;   FOOT SURGERY Left    LOWER EXTREMITY ANGIOGRAM Bilateral 05/27/2015   Procedure: Lower Extremity Angiogram;  Surgeon: Iran Ouch, MD;  [facility]: [facility];  Service: Cardiovascular;  Laterality: Bilateral;   PATCH ANGIOPLASTY Right 10/10/2019   Procedure: PATCH ANGIOPLASTY USING Livia Snellen BIOLOGIC PATCH;  Surgeon: Cephus Shelling, MD;  Location: [facility] OR;  Service: Vascular;  Laterality: Right;   PERIPHERAL VASCULAR ATHERECTOMY Left 05/08/2019   Procedure: PERIPHERAL VASCULAR ATHERECTOMY;  Surgeon: Iran Ouch, MD;   [facility];  Service: Cardiovascular;  Laterality: Left;   PERIPHERAL VASCULAR CATHETERIZATION N/A 05/27/2015   Procedure: Abdominal Aortogram;  Surgeon: Iran Ouch, MD;  [facility]: [facility];  Service: Cardiovascular;  Laterality: N/A;   PERIPHERAL VASCULAR CATHETERIZATION Right 05/27/2015   Procedure: Peripheral Vascular Intervention;  Surgeon: Iran Ouch, MD;  [facility]: [facility];  Service: Cardiovascular;  Laterality: Right;  SFA   TOTAL KNEE ARTHROPLASTY Right 2011   TOTAL KNEE ARTHROPLASTY Left 05/22/2014   Procedure: LEFT TOTAL KNEE ARTHROPLASTY;  Surgeon: Ranee Gosselin, MD;  Location: [facility];  Service: Orthopedics;  Laterality: Left;   TRIGGER FINGER RELEASE Left 07/07/2021   Procedure: LEFT INDEX FINGER RELEASE TRIGGER FINGER/A-1 PULLEY / CARPOMETACARPAL INJECTION;  Surgeon: Marlyne Beards, MD;  Location: [facility];  Service: Orthopedics;  Laterality: Left;   TUBAL LIGATION  1980   Current Outpatient Medications on File Prior to Visit  Medication Sig Dispense Refill   acetaminophen (TYLENOL) 500 MG tablet Take 1,000 mg by mouth every 6 (six) hours as needed for moderate pain or headache.     albuterol (VENTOLIN HFA) 108 (90 Base) MCG/ACT inhaler INHALE 2 PUFFS INTO THE LUNGS EVERY 6 HOURS AS NEEDED FOR WHEEZING OR SHORTNESS OF BREATH 18 g 3   Biotin w/ Vitamins C & E (HAIR/SKIN/NAILS PO) Take 1 tablet by mouth daily.     bisoprolol (ZEBETA) 5 MG tablet TAKE 1/2 TABLET(2.5 MG) BY MOUTH DAILY 45 tablet 3   clopidogrel (PLAVIX) 75 MG tablet TAKE 1 TABLET(75 MG) BY MOUTH DAILY 90 tablet 3   diclofenac sodium (VOLTAREN) 1 % GEL Apply 2 g topically 4 (four) times daily. (Patient taking differently: Apply 2 g topically 4 (four) times daily as needed (for hand pain).) 100 g 0   fluticasone-salmeterol (ADVAIR DISKUS) 250-50 MCG/ACT AEPB Inhale 1 puff into the lungs in the morning and at bedtime. 60 each 11   glucose blood (CONTOUR  NEXT TEST) test strip 1 each by Other route 3 (three) times daily. Use as instructed 100 each 12   HYDROcodone-acetaminophen (NORCO/VICODIN) 5-325 MG tablet Take 1 tablet by mouth every 6 (six) hours as needed for moderate pain. Chronic Pain. Dx: G89.4 130 tablet 0   isosorbide mononitrate (IMDUR) 60 MG 24 hr tablet TAKE 1 TABLET(60 MG) BY MOUTH DAILY 90 tablet 2   lipase/protease/amylase (CREON) 36000 UNITS CPEP capsule Take 2 capsules (72,000 Units total) by mouth 3 (three) times daily with meals. May also take 1 capsule (36,000 Units total) as needed (with snacks). 240 capsule 11   metFORMIN (GLUCOPHAGE) 1000 MG tablet TAKE 1 TABLET BY MOUTH TWICE DAILY WITH MEALS 60 tablet 0   methocarbamol (ROBAXIN) 500 MG tablet TAKE 1 TABLET BY MOUTH EVERY 6 HOURS AS NEEDED FOR MUSCLE SPASM 120 tablet 2   Multiple Vitamin (MULTIVITAMIN) tablet Take 1 tablet by mouth daily.  nitroGLYCERIN (NITROSTAT) 0.4 MG SL tablet Place 1 tablet under the tongue every 5 (five) minutes as needed for chest pain.     ondansetron (ZOFRAN) 4 MG tablet Take 1 tablet (4 mg total) by mouth every 8 (eight) hours as needed for nausea or vomiting. (Patient not taking: Reported on 08/04/2021) 21 tablet 1   pantoprazole (PROTONIX) 40 MG tablet TAKE 1 TABLET BY MOUTH DAILY 90 tablet 3   pioglitazone (ACTOS) 30 MG tablet TAKE 1 TABLET(30 MG) BY MOUTH DAILY 90 tablet 3   pregabalin (LYRICA) 50 MG capsule Take 1 capsule (50 mg total) by mouth 3 (three) times daily. 90 capsule 3   Probiotic Product (TRUBIOTICS PO) Take 1 capsule by mouth daily.     promethazine (PHENERGAN) 25 MG tablet TAKE 1 TABLET(25 MG) BY MOUTH EVERY 8 HOURS AS NEEDED FOR NAUSEA OR VOMITING 20 tablet 0   rosuvastatin (CRESTOR) 40 MG tablet TAKE 1 TABLET(40 MG) BY MOUTH EVERY NIGHT 90 tablet 3   sitaGLIPtin (JANUVIA) 100 MG tablet TAKE 1 TABLET(100 MG) BY MOUTH DAILY 90 tablet 1   vitamin B-12 (CYANOCOBALAMIN) 1000 MCG tablet Take 1,000 mcg by mouth daily.     Current  Facility-Administered Medications on File Prior to Visit  Medication Dose Route Frequency Provider Last Rate Last Admin   triamcinolone acetonide (KENALOG) 10 MG/ML injection 10 mg  10 mg Other Once Regal, Norman S, DPM       Allergies  Allergen Reactions   Gabapentin Other (See Comments)    Brain shakes   Glucotrol [Glipizide] Rash and Other (See Comments)    Red rash   Influenza Vaccines Other (See Comments)    Significant arm soreness requiring 1 year of physical therapy   Latex Rash   Nortriptyline Other (See Comments)    BRAIN SHAKE   Tetanus Toxoid Hives, Swelling, Rash and Other (See Comments)    Swelling to site of injection with rash and fever   Other Itching, Other (See Comments) and Cough    Patient is highly allergic to Cats and she begins to break out in rash and turns red if someone caring for her has cats at home.  Patient takes Claritin for this type of reaction.  Patient stated that if it goes untreated then he begins to have difficulty breathing   Invokana [Canagliflozin] Other (See Comments)    Yeast Infections   Social History   Socioeconomic History   Marital status: Married    Spouse name: Not on file   Number of children: Not on file   Years of education: 10   Highest education level: Not on file  Occupational History   Not on file  Tobacco Use   Smoking status: Former    Types: Cigarettes    Quit date: 12/13/2008    Years since quitting: 12.8   Smokeless tobacco: Never  Vaping Use   Vaping Use: Never used  Substance and Sexual Activity   Alcohol use: No    Comment: Quit drinking around year 2000   Drug use: No    Comment: Patient quit smoking around 2011   Sexual activity: Yes    Partners: Male    Birth control/protection: Post-menopausal  Other Topics Concern   Not on file  Social History Narrative   Denies any IV drug use or marijuana use.  Former smoker for 30 years, quit Nov. 2010. She is married with 2 children. Disabled from knee, not  working.   Social Determinants of Health  Financial Resource Strain: Medium Risk (09/28/2018)   Overall Financial Resource Strain (CARDIA)    Difficulty of Paying Living Expenses: Somewhat hard  Food Insecurity: Not on file  Transportation Needs: No Transportation Needs (09/28/2018)   PRAPARE - Administrator, Civil Service (Medical): No    Lack of Transportation (Non-Medical): No  Physical Activity: Not on file  Stress: Not on file  Social Connections: Unknown (09/28/2018)   Social Connection and Isolation Panel [NHANES]    Frequency of Communication with Friends and Family: Once a week    Frequency of Social Gatherings with Friends and Family: Once a week    Attends Religious Services: Not on Marketing executive or Organizations: Not on file    Attends Banker Meetings: Not on file    Marital Status: Married  Intimate Partner Violence: Not At Risk (09/28/2018)   Humiliation, Afraid, Rape, and Kick questionnaire    Fear of Current or Ex-Partner: No    Emotionally Abused: No    Physically Abused: No    Sexually Abused: No   Family History  Problem Relation Age of Onset   CVA Mother    Emphysema Father    Breast cancer Sister 62   Colon cancer Neg Hx      Review of Systems  All other systems reviewed and are negative.      Objective:   Physical Exam Vitals reviewed.  Constitutional:      General: She is not in acute distress.    Appearance: She is well-developed. She is not diaphoretic.  Eyes:     Conjunctiva/sclera: Conjunctivae normal.  Neck:     Vascular: No carotid bruit or JVD.  Cardiovascular:     Rate and Rhythm: Normal rate and regular rhythm.     Heart sounds: Normal heart sounds. No murmur heard. Pulmonary:     Effort: Pulmonary effort is normal.     Breath sounds: Normal breath sounds. No wheezing or rales.  Chest:     Chest wall: Tenderness present. No mass, deformity, swelling or crepitus.    Abdominal:      General: Bowel sounds are normal. There is no distension.     Palpations: Abdomen is soft. There is no mass.     Tenderness: There is no abdominal tenderness. There is no guarding or rebound.  Musculoskeletal:     Cervical back: Neck supple.     Thoracic back: Tenderness and bony tenderness present. No swelling or deformity. Decreased range of motion.     Lumbar back: Normal.       Back:  Skin:    Findings: No erythema or rash.  Neurological:     Mental Status: She is alert and oriented to person, place, and time.     Cranial Nerves: No cranial nerve deficit.     Motor: No abnormal muscle tone.     Coordination: Coordination normal.     Deep Tendon Reflexes: Reflexes are normal and symmetric.         Assessment & Plan:  Mid-back pain, acute - Plan: DG Thoracic Spine W/Swimmers  Chest wall pain - Plan: DG Chest 2 View I believe her pain is musculoskeletal.  I do not believe that it is cardiac or pulmonary in origin.  I am suspicious that the patient may have cracked a vertebrae in her thoracic spine or even herniated disc of her thoracic spine with thoracic radiculopathy in the chest.  I recommended that we  get a chest x-ray and a thoracic spine x-ray to evaluate further.  I will empirically treat the patient with prednisone taper pack for possible herniated disc or arthritis and await results of the x-ray.

## 2021-10-01 ENCOUNTER — Ambulatory Visit
Admission: RE | Admit: 2021-10-01 | Discharge: 2021-10-01 | Disposition: A | Discharge status: Home / Self Care | Payer: 59 | Source: Ambulatory Visit | Attending: Family Medicine | Admitting: Family Medicine

## 2021-10-01 DIAGNOSIS — M2578 Osteophyte, vertebrae: Secondary | ICD-10-CM | POA: Diagnosis not present

## 2021-10-01 DIAGNOSIS — R0789 Other chest pain: Secondary | ICD-10-CM

## 2021-10-01 DIAGNOSIS — M4804 Spinal stenosis, thoracic region: Secondary | ICD-10-CM | POA: Diagnosis not present

## 2021-10-01 DIAGNOSIS — M5134 Other intervertebral disc degeneration, thoracic region: Secondary | ICD-10-CM | POA: Diagnosis not present

## 2021-10-01 DIAGNOSIS — R079 Chest pain, unspecified: Secondary | ICD-10-CM | POA: Diagnosis not present

## 2021-10-01 DIAGNOSIS — M549 Dorsalgia, unspecified: Secondary | ICD-10-CM

## 2021-10-04 ENCOUNTER — Telehealth: Payer: Self-pay | Admitting: Family Medicine

## 2021-10-04 ENCOUNTER — Other Ambulatory Visit: Payer: Self-pay | Admitting: Family Medicine

## 2021-10-04 MED ORDER — PREGABALIN 50 MG PO CAPS
50.0000 mg | ORAL_CAPSULE | Freq: Three times a day (TID) | ORAL | 3 refills | Status: DC
Start: 2021-10-04 — End: 2021-10-07

## 2021-10-04 NOTE — Telephone Encounter (Signed)
[facility] is [facility] [address] She is requesting a new prescription for the following medication:  pregabalin (LYRICA) 50 MG capsule [[id number]]    Order Details Dose: 50 mg Route: Oral Frequency: 3 times daily  Dispense Quantity: 90 capsule Refills: 3        Sig: Take 1 capsule (50 mg total) by mouth 3 (three) times daily.       Start Date: 01/11/21 End Date: --  Written Date: 01/11/21     promethazine (PHENERGAN) 25 MG tablet [[id number]]    Order Details Dose, Route, Frequency: As Directed  Dispense Quantity: 20 tablet Refills: 0        Sig: TAKE 1 TABLET(25 MG) BY MOUTH EVERY 8 HOURS AS NEEDED FOR NAUSEA OR VOMITING       Start Date: 09/13/21 End Date: --  Written Date: 09/13/21       CB# [phone]

## 2021-10-05 ENCOUNTER — Telehealth: Payer: Self-pay

## 2021-10-05 ENCOUNTER — Other Ambulatory Visit: Payer: Self-pay | Admitting: Family Medicine

## 2021-10-05 MED ORDER — HYDROCODONE-ACETAMINOPHEN 5-325 MG PO TABS
1.0000 | ORAL_TABLET | Freq: Four times a day (QID) | ORAL | 0 refills | Status: DC | PRN
Start: 2021-10-05 — End: 2021-11-11

## 2021-10-05 NOTE — Telephone Encounter (Signed)
Advised pt of CXR and Back x-ray results. Pt states that she had to stop the Prednisone for her back pain because it triggered a migraine that caused her to be in bed x 2 days. Is there anything else she can do for her back pain? Thanks.

## 2021-10-05 NOTE — Telephone Encounter (Signed)
Pt called in stating that her recent refill of pain meds were sent to the wrong pharmacy. [facility] [address]   Cb#: [phone]

## 2021-10-06 ENCOUNTER — Other Ambulatory Visit: Payer: Self-pay

## 2021-10-06 NOTE — Patient Outreach (Signed)
Aging Gracefully Program  RN Visit  10/06/2021  [name] 08/07/1955 [id number]  Visit:   RN home visit #3  Start Time:   0930 End Time:   1015 Total Minutes:   45  Readiness To Change Score:     Universal RN Interventions: Calendar Distribution: Yes Exercise Review: Yes Medications: Yes Mood: Yes Pain: Yes PCP Advocacy/Support: No Fall Prevention: Yes Clinician View Of Client Situation: Patient met me at the door. Nored ramp is built and boards replaced at front entrance.  Noted the top step seems to be unlevel. Patient is awake, alert, oreinted and ambulating well.  Pleaant mood. Client View Of His/Her Situation: Patient reports that she is doing well. Reports she had to change her insurance and is now in the process of [facility]  Reports she is also getting a new CBG meter that goes on her arm. Reports she has a friend who is helping her set up.  Reports she has been busy taking care of her husabnd due to his eye surgery. Continues to walk daily but is not dong her home exercises as she should be. Denies falls.  Reports CBG around 100.  07/30/2021  A1c  6.3  Healthcare Provider Communication: Did RN Contact With Client's Healthcare Provider?: No According to Client, Did PCP Report Communication With An Aging Gracefully RN?: No  Clinician View of Client Situation: Clinician View Of Client Situation: Patient met me at the door. Nored ramp is built and boards replaced at front entrance.  Noted the top step seems to be unlevel. Patient is awake, alert, oreinted and ambulating well.  Pleaant mood. Client's View of His/Her Situation: Client View Of His/Her Situation: Patient reports that she is doing well. Reports she had to change her insurance and is now in the process of [facility]  Reports she is also getting a new CBG meter that goes on her arm. Reports she has a friend who is helping her set up.  Reports she has been  busy taking care of her husabnd due to his eye surgery. Continues to walk daily but is not dong her home exercises as she should be. Denies falls.  Reports CBG around 100.  07/30/2021  A1c  6.3  Medication Assessment: denies any changes to medications    OT Update: home modifications underway. Pending roof and completion of ramp  Session Summary: Patient doing well. No new concerns today.   Goals Addressed             This Visit's Progress    Aging Gracefully Patient will verbalize DM management strategies over the next 4 months       Aging Gracefully RN  08/04/21 Assessment: Per Client, received report from provider office that A1C is good, but she does not know the level. Usually eats two meals/day. She does not eat breakfast. Antinausea medication for stomach issues. She Is very receptive to diabetes education reinforcement. Per chart A1C 6.3 on 07/30/61.  Interventions: Provide Diabetes booklet Encourage to take medications as prescribed Check blood sugar as prescribed and notify provider if consistently outside recommended range. Target 80-130 before meals and <180 after meals. Encouraged to contact provider with any questions or concerns  Juana Wallace, RN, MSN, BSN, CCM Aging Gracefully Care Management Coordinator [phone]   09/01/2021 Assessment:  Reviewed current CBG readings of 80-100. Reports taking medications as prescribed. Exercising daily.  Interventions:  Reviewed home exercise plan. Encouraged   patient to do her exercises daily for increase strength of her legs.  Reviewed precautions of checking feet.  Plan: follow up home visit planned in 1 month.  Amanda Cook RN, BSN, CEN RN Case Manager for Aging Gracefully [facility] Mobile: [phone]   10/06/2021 Assessment:  Patient reports CBG normal. Reports taking all medication as prescribed.  Last A1C f 6.3.   Is in the process of getting a new meter .  Intervention: Encouraged patient to  continue to self monitor and report any abnormal readings to MD.  Encouraged patient to call me or the pharmacy if she has problems setting up her new meter.   Plan: will see patient in 1 month for follow up.  Amanda Cook RN, BSN, CEN RN Case Manager for Aging Gracefully [facility] Mobile: [phone]           

## 2021-10-06 NOTE — Patient Instructions (Signed)
Visit Information  Thank you for taking time to visit with me today. Please don't hesitate to contact me if I can be of assistance to you before our next scheduled telephone appointment.  Following are the goals we discussed today:   Goals Addressed             This Visit's Progress    Aging Gracefully Patient will verbalize DM management strategies over the next 4 months       Aging Gracefully RN  08/04/21 Assessment: Per Client, received report from provider office that A1C is good, but she does not know the level. Usually eats two meals/day. She does not eat breakfast. Antinausea medication for stomach issues. She Is very receptive to diabetes education reinforcement. Per chart A1C 6.3 on 07/30/61.  Interventions: Provide Diabetes booklet Encourage to take medications as prescribed Check blood sugar as prescribed and notify provider if consistently outside recommended range. Target 80-130 before meals and <180 after meals. Encouraged to contact provider with any questions or concerns  Kathyrn Sheriff, RN, MSN, BSN, CCM Aging Gracefully Care Management Coordinator [phone]   09/01/2021 Assessment:  Reviewed current CBG readings of 80-100. Reports taking medications as prescribed. Exercising daily.  Interventions:  Reviewed home exercise plan. Encouraged patient to do her exercises daily for increase strength of her legs.  Reviewed precautions of checking feet.  Plan: follow up home visit planned in 1 month.  Rowe Pavy RN, BSN, CEN RN Case Production designer, theatre/television/film for [employer] [facility] Mobile: [phone]   10/06/2021 Assessment:  Patient reports CBG normal. Reports taking all medication as prescribed.  Last A1C f 6.3.   Is in the process of getting a new meter .  Intervention: Encouraged patient to continue to self monitor and report any abnormal readings to MD.  Encouraged patient to call me or the pharmacy if she has problems setting up her new meter.   Plan:  will see patient in 1 month for follow up.  Rowe Pavy RN, BSN, CEN RN Case Production designer, theatre/television/film for Aging Gracefully [facility] Mobile: [phone]          Our next appointment is  in person  on 11/02/2021 at 0930  Please call the care guide team at [phone] if you need to cancel or reschedule your appointment.   If you are experiencing a Mental Health or Behavioral Health Crisis or need someone to talk to, please call the Suicide and Crisis Lifeline: 988 call the [place] National Suicide Prevention Lifeline: (224)686-3559 or TTY: 848-579-0762 TTY (336)350-7572) to talk to a trained counselor call 1-800-273-TALK (toll free, 24 hour hotline) go to [facility] [address], [place] [phone]) call 911   The patient verbalized understanding of instructions, educational materials, and care plan provided today and agreed to receive a mailed copy of patient instructions, educational materials, and care plan.   Rowe Pavy RN, BSN, Careers adviser for [employer] Mobile: [phone]

## 2021-10-07 ENCOUNTER — Telehealth: Payer: Self-pay

## 2021-10-07 ENCOUNTER — Other Ambulatory Visit: Payer: Self-pay | Admitting: Family Medicine

## 2021-10-07 DIAGNOSIS — K861 Other chronic pancreatitis: Secondary | ICD-10-CM

## 2021-10-07 MED ORDER — PREGABALIN 50 MG PO CAPS
50.0000 mg | ORAL_CAPSULE | Freq: Three times a day (TID) | ORAL | 3 refills | Status: DC
Start: 2021-10-07 — End: 2021-10-28

## 2021-10-07 MED ORDER — ONDANSETRON HCL 4 MG PO TABS: 4.0000 mg | ORAL_TABLET | Freq: Three times a day (TID) | ORAL | 1 refills | Status: DC | PRN

## 2021-10-07 NOTE — Telephone Encounter (Signed)
Pt called in stating that these two meds were sent to the pharmacy she no longer uses. [facility] [address] [facility]  ondansetron (ZOFRAN) 4 MG tablet [[id number]]  pregabalin (LYRICA) 50 MG capsule [[id number]]    Cb#: [phone]

## 2021-10-08 ENCOUNTER — Other Ambulatory Visit: Payer: Self-pay

## 2021-10-08 NOTE — Telephone Encounter (Signed)
Pt called stating PA is required for Lyrica 50 mg. PA sent through [employer]. Response as follows:  Your information has been submitted to [employer]. [employer] will review the request and will issue a decision, typically within 1-3 days from your submission. You can check the updated outcome later by reopening this request.  If [employer] has not responded in 1-3 days or if you have any questions about your ePA request, please contact [employer] at [phone]. If you think there may be a problem with your PA request, use our live chat feature at the bottom right.  Pt advised and verbalized understanding of all. Mjp,lpn

## 2021-10-11 ENCOUNTER — Telehealth: Payer: Self-pay | Admitting: Family Medicine

## 2021-10-11 NOTE — Telephone Encounter (Signed)
Called pharm to confirm pt has not picked up Lyrica rx. Pharm states she has not and it can not be transferred, would need to be sent to correct pharm on [address]

## 2021-10-11 NOTE — Telephone Encounter (Signed)
Patient called to follow up on refills for       Lyrica 50 MG promethazine (PHENERGAN) 25 MG tablet   [facility] [address]  Patient spoke with pharmacist several times. They still haven't received the refill requests. Patient states she is no longer using [facility].  Please advise at [phone]. If she doesn't answer, please call her husband Tommy's cell at [phone].

## 2021-10-11 NOTE — Telephone Encounter (Signed)
Requested medication (s) are due for refill today: yes  Requested medication (s) are on the active medication list: yes  Last refill:  Lyrica 10/07/21 #90 with 3 RF, CALLED TO INCORRECT PHARM[facility] BUT THEY CAN NOT TRANSFER IT TO [address] Phenergan 09/13/21 #25 with 0 RF  Future visit scheduled: no, just seen 09/30/21  Notes to clinic:  The Lyrica is at incorrect pharm and has not been picked up by pt, [facility] [address] Lyrica and Phenergan are not delegated, please assess.      Requested Prescriptions  Pending Prescriptions Disp Refills   promethazine (PHENERGAN) 25 MG tablet 20 tablet 0     Not Delegated - Gastroenterology: Antiemetics Failed - 10/11/2021 12:35 PM      Failed - This refill cannot be delegated      Failed - Valid encounter within last 6 months    Recent Outpatient Visits           9 months ago Type 2 diabetes mellitus with diabetic neuropathy, unspecified whether long term insulin use (HCC)   [facility] Pickard, Priscille Heidelberg, MD   1 year ago Type 2 diabetes mellitus with diabetic neuropathy, unspecified whether long term insulin use (HCC)   [facility] Pickard, Priscille Heidelberg, MD   1 year ago Gynecologic exam normal   [facility] Cathlean Marseilles A, NP   1 year ago Type 2 diabetes mellitus with other circulatory complication, without long-term current use of insulin (HCC)   [facility] Pickard, Priscille Heidelberg, MD   1 year ago Injury of left toe, initial encounter   [facility] [place], Velna Hatchet, MD       Future Appointments             In 1 month Kirke Corin, Chelsea Aus, MD [facility] [address] [facility] Dept Of [place]. [facility]             pregabalin (LYRICA) 50 MG capsule 90 capsule 3    Sig: Take 1 capsule (50 mg total) by mouth 3 (three) times daily.     Not Delegated - Neurology:  Anticonvulsants - Controlled -  pregabalin Failed - 10/11/2021 12:35 PM      Failed - This refill cannot be delegated      Passed - Cr in normal range and within 360 days    Creat  Date Value Ref Range Status  07/30/2021 1.05 0.50 - 1.05 mg/dL Final   Creatinine, Urine  Date Value Ref Range Status  02/18/2019 16 (L) 20 - 275 mg/dL Final         Passed - Completed PHQ-2 or PHQ-9 in the last 360 days      Passed - Valid encounter within last 12 months    Recent Outpatient Visits           9 months ago Type 2 diabetes mellitus with diabetic neuropathy, unspecified whether long term insulin use (HCC)   [facility] Pickard, Priscille Heidelberg, MD   1 year ago Type 2 diabetes mellitus with diabetic neuropathy, unspecified whether long term insulin use (HCC)   [facility] Pickard, Priscille Heidelberg, MD   1 year ago Gynecologic exam normal   [facility] Cathlean Marseilles A, NP   1 year ago Type 2 diabetes mellitus with other circulatory complication, without long-term  current use of insulin (HCC)   [facility] Pickard, Priscille Heidelberg, MD   1 year ago Injury of left toe, initial encounter   [facility] [place], Velna Hatchet, MD       Future Appointments             In 1 month Kirke Corin, Chelsea Aus, MD [facility] [address] [facility] Dept Of [place]. [facility]

## 2021-10-11 NOTE — Telephone Encounter (Signed)
Patient called to follow up on refills; states they've been sent to [facility] but she's no longer using that pharmacy. [facility]; she's currently out of meds.   [facility] [address] [phone]  MEDS:  (ZOFRAN) 4 MG tablet [[id number]]  pregabalin (LYRICA) 50 MG capsule [[id number]]  LOV:  09/30/2021  Please advise at [phone].

## 2021-10-11 NOTE — Telephone Encounter (Signed)
Called and spoked w/pt regarding her following meds.   Pregabalin (Lyrica): told pt that PA came back today 10/11/21, denied. Told pt we'll appeal it due to pt can't take the alternaltive'gabapentin'due allergies.   Per pt, does NOT take Zofran. Pt takes Promethazine (phenergan). Told pt will call [employer] and get PA started.   Told pt will get to her sometime tom or Wednesday. Nothing further.

## 2021-10-11 NOTE — Addendum Note (Signed)
Addended by: Wilford Corner on: 10/11/2021 12:35 PM   Modules accepted: Orders

## 2021-10-12 ENCOUNTER — Other Ambulatory Visit: Payer: Self-pay | Admitting: Family Medicine

## 2021-10-12 MED ORDER — PROMETHAZINE HCL 25 MG PO TABS: ORAL_TABLET | ORAL | 0 refills | Status: DC

## 2021-10-12 NOTE — Telephone Encounter (Signed)
Please advise. Thank you

## 2021-10-13 NOTE — Telephone Encounter (Signed)
Update 10/13/21  Spoke w/pt this afternoon, re her meds.   Per pt already picked up her Phenergan yesterday. No PA needed.   As for the Lyrica, told pt that an appeal for the PA has already been sent to her insurance and now we are just waiting for a response back. Will call and let her know as soon as we hear from them.   Pt voiced understanding. Nothing further.

## 2021-10-14 ENCOUNTER — Other Ambulatory Visit: Payer: Self-pay

## 2021-10-20 ENCOUNTER — Other Ambulatory Visit: Payer: Self-pay

## 2021-10-20 NOTE — Telephone Encounter (Signed)
Received another e-fax from pharmacy to request refill of  methocarbamol (ROBAXIN) 500 MG tablet [[id number]]   Ph[facility]acy[facility]   [id number] [place], [place] [address]  [address] [place], [place] [address]  Phone:  [phone]  Fax:  [phone]  DEA #:  [id number]  LOV: 09/30/2021  Please advise pharmaicst at [phone].

## 2021-10-20 NOTE — Telephone Encounter (Signed)
Pharmacy faxed a refill request for metFORMIN (GLUCOPHAGE) 1000 MG tablet [[id number]]    Order Details Dose, Route, Frequency: As Directed  Dispense Quantity: 60 tablet Refills: 0        Sig: TAKE 1 TABLET BY MOUTH TWICE DAILY WITH MEALS       Start Date: 08/23/21 End Date: --  Written Date: 08/23/21 Expiration Date: 08/23/22  Original Order:  metFORMIN (GLUCOPHAGE) 1000 MG tablet [[id number]]

## 2021-10-21 MED ORDER — METFORMIN HCL 1000 MG PO TABS: 1000.0000 mg | ORAL_TABLET | Freq: Two times a day (BID) | ORAL | 2 refills | Status: DC

## 2021-10-21 MED ORDER — METHOCARBAMOL 500 MG PO TABS: ORAL_TABLET | ORAL | 2 refills | Status: DC

## 2021-10-21 NOTE — Telephone Encounter (Signed)
Requested medication (s) are due for refill today -yes  Requested medication (s) are on the active medication list -yes  Future visit scheduled -no  Last refill: 07/22/21 #120 2RF  Notes to clinic: non delegated Rx  Requested Prescriptions  Pending Prescriptions Disp Refills   methocarbamol (ROBAXIN) 500 MG tablet 120 tablet 2     Not Delegated - Analgesics:  Muscle Relaxants Failed - 10/20/2021 11:38 AM      Failed - This refill cannot be delegated      Failed - Valid encounter within last 6 months    Recent Outpatient Visits           9 months ago Type 2 diabetes mellitus with diabetic neuropathy, unspecified whether long term insulin use ([place])   [place] Pickard, Cammie Mcgee, MD   1 year ago Type 2 diabetes mellitus with diabetic neuropathy, unspecified whether long term insulin use ([place])   [place] Pickard, Cammie Mcgee, MD   1 year ago Gynecologic exam normal   [place] Noemi Chapel A, NP   1 year ago Type 2 diabetes mellitus with other circulatory complication, without long-term current use of insulin ([place])   [place] Pickard, Cammie Mcgee, MD   1 year ago Injury of left toe, initial encounter   [place], Modena Nunnery, MD       Future Appointments             In 1 month Fletcher Anon, Mertie Clause, MD [place] [facility] Dept Of . [facility]            Signed Prescriptions Disp Refills   metFORMIN (GLUCOPHAGE) 1000 MG tablet 60 tablet 2    Sig: Take 1 tablet (1,000 mg total) by mouth 2 (two) times daily with a meal.     Endocrinology:  Diabetes - Biguanides Failed - 10/20/2021 11:38 AM      Failed - eGFR in normal range and within 360 days    GFR, Est African American  Date Value Ref Range Status  04/03/2020 69 > OR = 60 mL/min/1.56m Final   GFR, Est Non African American  Date Value Ref Range Status  04/03/2020 59 (L) > OR = 60 mL/min/1.723m Final   GFR, Estimated  Date Value Ref Range Status  06/30/2021 58 (L) >60 mL/min Final    Comment:    (NOTE) Calculated using the CKD-EPI Creatinine Equation (2021)    GFR  Date Value Ref Range Status  12/07/2017 59.28 (L) >60.00 mL/min Final   eGFR  Date Value Ref Range Status  07/30/2021 59 (L) > OR = 60 mL/min/1.7332minal    Comment:    The eGFR is based on the CKD-EPI 2021 equation. To calculate  the new eGFR from a previous Creatinine or Cystatin C result, go to https://www.kidney.org/professionals/ kdoqi/gfr%5Fcalculator          Failed - Valid encounter within last 6 months    Recent Outpatient Visits           9 months ago Type 2 diabetes mellitus with diabetic neuropathy, unspecified whether long term insulin use (HCCAp[place] BroLuthercSusy FrizzleD   1 year ago Type 2 diabetes mellitus with diabetic neuropathy, unspecified whether long term insulin use (HCCRanch[facility] [place] Pickard, WarCammie McgeeD   1 year ago Gynecologic exam normal   [facility] Eulogio Bear, NP   1 year ago Type 2 diabetes mellitus with other circulatory complication, without long-term current use of insulin ([place])   [place] Pickard, Cammie Mcgee, MD   1 year ago Injury of left toe, initial encounter   [place], Modena Nunnery, MD       Future Appointments             In 1 month Fletcher Anon, Mertie Clause, MD [place] [facility] Dept Of [place]. [facility]            Passed - Cr in normal range and within 360 days    Creat  Date Value Ref Range Status  07/30/2021 1.05 0.50 - 1.05 mg/dL Final   Creatinine, Urine  Date Value Ref Range Status  02/18/2019 16 (L) 20 - 275 mg/dL Final         Passed - HBA1C is between 0 and 7.9 and within 180 days    Hgb A1c MFr Bld  Date Value Ref Range Status  07/30/2021 6.3 (H) <5.7 % of total Hgb Final    Comment:    For someone  without known diabetes, a hemoglobin  A1c value between 5.7% and 6.4% is consistent with prediabetes and should be confirmed with a  follow-up test. . For someone with known diabetes, a value <7% indicates that their diabetes is well controlled. A1c targets should be individualized based on duration of diabetes, age, comorbid conditions, and other considerations. . This assay result is consistent with an increased risk of diabetes. . Currently, no consensus exists regarding use of hemoglobin A1c for diagnosis of diabetes for children. .          Passed - B12 Level in normal range and within 720 days    Vitamin B-12  Date Value Ref Range Status  05/08/2020 313 200 - 1,100 pg/mL Final    Comment:    . Please Note: Although the reference range for vitamin B12 is [phone] pg/mL, it has been reported that between 5 and 10% of patients with values between 200 and 400 pg/mL may experience neuropsychiatric and hematologic abnormalities due to occult B12 deficiency; less than 1% of patients with values above 400 pg/mL will have symptoms. .          Passed - CBC within normal limits and completed in the last 12 months    WBC  Date Value Ref Range Status  07/30/2021 5.0 3.8 - 10.8 Thousand/uL Final   RBC  Date Value Ref Range Status  07/30/2021 4.41 3.80 - 5.10 Million/uL Final   Hemoglobin  Date Value Ref Range Status  07/30/2021 12.4 11.7 - 15.5 g/dL Final  05/03/2019 12.5 11.1 - 15.9 g/dL Final   HCT  Date Value Ref Range Status  07/30/2021 38.6 35.0 - 45.0 % Final   Hematocrit  Date Value Ref Range Status  05/03/2019 40.1 34.0 - 46.6 % Final   MCHC  Date Value Ref Range Status  07/30/2021 32.1 32.0 - 36.0 g/dL Final   [facility]  Date Value Ref Range Status  07/30/2021 28.1 27.0 - 33.0 pg Final   MCV  Date Value Ref Range Status  07/30/2021 87.5 80.0 - 100.0 fL Final  05/03/2019 91 79 - 97 fL Final   No results found for: "PLTCOUNTKUC", "LABPLAT",  "POCPLA" RDW  Date Value Ref Range Status  07/30/2021 12.9 11.0 - 15.0 % Final  05/03/2019 13.3 11.7 -  15.4 % Final            Requested Prescriptions  Pending Prescriptions Disp Refills   methocarbamol (ROBAXIN) 500 MG tablet 120 tablet 2     Not Delegated - Analgesics:  Muscle Relaxants Failed - 10/20/2021 11:38 AM      Failed - This refill cannot be delegated      Failed - Valid encounter within last 6 months    Recent Outpatient Visits           9 months ago Type 2 diabetes mellitus with diabetic neuropathy, unspecified whether long term insulin use ([place])   [place] Pickard, Cammie Mcgee, MD   1 year ago Type 2 diabetes mellitus with diabetic neuropathy, unspecified whether long term insulin use ([place])   [place] Pickard, Cammie Mcgee, MD   1 year ago Gynecologic exam normal   [place] Noemi Chapel A, NP   1 year ago Type 2 diabetes mellitus with other circulatory complication, without long-term current use of insulin ([place])   [place] Pickard, Cammie Mcgee, MD   1 year ago Injury of left toe, initial encounter   [place], Modena Nunnery, MD       Future Appointments             In 1 month Fletcher Anon, Mertie Clause, MD [place] [facility] Dept Of [place]. [facility]            Signed Prescriptions Disp Refills   metFORMIN (GLUCOPHAGE) 1000 MG tablet 60 tablet 2    Sig: Take 1 tablet (1,000 mg total) by mouth 2 (two) times daily with a meal.     Endocrinology:  Diabetes - Biguanides Failed - 10/20/2021 11:38 AM      Failed - eGFR in normal range and within 360 days    GFR, Est African American  Date Value Ref Range Status  04/03/2020 69 > OR = 60 mL/min/1.78m Final   GFR, Est Non African American  Date Value Ref Range Status  04/03/2020 59 (L) > OR = 60 mL/min/1.773mFinal   GFR, Estimated  Date Value Ref Range Status  06/30/2021 58 (L) >60 mL/min  Final    Comment:    (NOTE) Calculated using the CKD-EPI Creatinine Equation (2021)    GFR  Date Value Ref Range Status  12/07/2017 59.28 (L) >60.00 mL/min Final   eGFR  Date Value Ref Range Status  07/30/2021 59 (L) > OR = 60 mL/min/1.7343minal    Comment:    The eGFR is based on the CKD-EPI 2021 equation. To calculate  the new eGFR from a previous Creatinine or Cystatin C result, go to https://www.kidney.org/professionals/ kdoqi/gfr%5Fcalculator          Failed - Valid encounter within last 6 months    Recent Outpatient Visits           9 months ago Type 2 diabetes mellitus with diabetic neuropathy, unspecified whether long term insulin use (HCCPenuelas BroTaylorckard, WarCammie McgeeD   1 year ago Type 2 diabetes mellitus with diabetic neuropathy, unspecified whether long term insulin use (HCCCrest[place] BroNewportckard, WarCammie McgeeD   1 year ago Gynecologic exam normal   BroSuwaneerNoemi Chapel NP   1 year ago Type 2 diabetes mellitus with other circulatory complication, without long-term current use of insulin (  [place])   [place] Pickard, Cammie Mcgee, MD   1 year ago Injury of left toe, initial encounter   [place], Modena Nunnery, MD       Future Appointments             In 1 month Fletcher Anon, Mertie Clause, MD [place] [facility] Dept Of [place]. [facility]            Passed - Cr in normal range and within 360 days    Creat  Date Value Ref Range Status  07/30/2021 1.05 0.50 - 1.05 mg/dL Final   Creatinine, Urine  Date Value Ref Range Status  02/18/2019 16 (L) 20 - 275 mg/dL Final         Passed - HBA1C is between 0 and 7.9 and within 180 days    Hgb A1c MFr Bld  Date Value Ref Range Status  07/30/2021 6.3 (H) <5.7 % of total Hgb Final    Comment:    For someone without known diabetes, a hemoglobin  A1c value between 5.7% and 6.4% is consistent  with prediabetes and should be confirmed with a  follow-up test. . For someone with known diabetes, a value <7% indicates that their diabetes is well controlled. A1c targets should be individualized based on duration of diabetes, age, comorbid conditions, and other considerations. . This assay result is consistent with an increased risk of diabetes. . Currently, no consensus exists regarding use of hemoglobin A1c for diagnosis of diabetes for children. .          Passed - B12 Level in normal range and within 720 days    Vitamin B-12  Date Value Ref Range Status  05/08/2020 313 200 - 1,100 pg/mL Final    Comment:    . Please Note: Although the reference range for vitamin B12 is [phone] pg/mL, it has been reported that between 5 and 10% of patients with values between 200 and 400 pg/mL may experience neuropsychiatric and hematologic abnormalities due to occult B12 deficiency; less than 1% of patients with values above 400 pg/mL will have symptoms. .          Passed - CBC within normal limits and completed in the last 12 months    WBC  Date Value Ref Range Status  07/30/2021 5.0 3.8 - 10.8 Thousand/uL Final   RBC  Date Value Ref Range Status  07/30/2021 4.41 3.80 - 5.10 Million/uL Final   Hemoglobin  Date Value Ref Range Status  07/30/2021 12.4 11.7 - 15.5 g/dL Final  05/03/2019 12.5 11.1 - 15.9 g/dL Final   HCT  Date Value Ref Range Status  07/30/2021 38.6 35.0 - 45.0 % Final   Hematocrit  Date Value Ref Range Status  05/03/2019 40.1 34.0 - 46.6 % Final   MCHC  Date Value Ref Range Status  07/30/2021 32.1 32.0 - 36.0 g/dL Final   [facility]  Date Value Ref Range Status  07/30/2021 28.1 27.0 - 33.0 pg Final   MCV  Date Value Ref Range Status  07/30/2021 87.5 80.0 - 100.0 fL Final  05/03/2019 91 79 - 97 fL Final   No results found for: "PLTCOUNTKUC", "LABPLAT", "POCPLA" RDW  Date Value Ref Range Status  07/30/2021 12.9 11.0 - 15.0 % Final  05/03/2019  13.3 11.7 - 15.4 % Final

## 2021-10-21 NOTE — Telephone Encounter (Signed)
Appointment 07/30/21- OV-cont medications Requested Prescriptions  Pending Prescriptions Disp Refills  . methocarbamol (ROBAXIN) 500 MG tablet 120 tablet 2     Not Delegated - Analgesics:  Muscle Relaxants Failed - 10/20/2021 11:38 AM      Failed - This refill cannot be delegated      Failed - Valid encounter within last 6 months    Recent Outpatient Visits          9 months ago Type 2 diabetes mellitus with diabetic neuropathy, unspecified whether long term insulin use ([place])   [place] Pickard, Cammie Mcgee, MD   1 year ago Type 2 diabetes mellitus with diabetic neuropathy, unspecified whether long term insulin use ([place])   [place] Pickard, Cammie Mcgee, MD   1 year ago Gynecologic exam normal   [place] Eulogio Bear, NP   1 year ago Type 2 diabetes mellitus with other circulatory complication, without long-term current use of insulin ([place])   [place] Pickard, Cammie Mcgee, MD   1 year ago Injury of left toe, initial encounter   [place], Modena Nunnery, MD      Future Appointments            In 1 month Fletcher Anon, Mertie Clause, MD [place] [facility] Dept Of [place]. [facility]           . metFORMIN (GLUCOPHAGE) 1000 MG tablet 60 tablet 2    Sig: Take 1 tablet (1,000 mg total) by mouth 2 (two) times daily with a meal.     Endocrinology:  Diabetes - Biguanides Failed - 10/20/2021 11:38 AM      Failed - eGFR in normal range and within 360 days    GFR, Est African American  Date Value Ref Range Status  04/03/2020 69 > OR = 60 mL/min/1.52m Final   GFR, Est Non African American  Date Value Ref Range Status  04/03/2020 59 (L) > OR = 60 mL/min/1.785mFinal   GFR, Estimated  Date Value Ref Range Status  06/30/2021 58 (L) >60 mL/min Final    Comment:    (NOTE) Calculated using the CKD-EPI Creatinine Equation (2021)    GFR  Date Value Ref Range Status  12/07/2017  59.28 (L) >60.00 mL/min Final   eGFR  Date Value Ref Range Status  07/30/2021 59 (L) > OR = 60 mL/min/1.7350minal    Comment:    The eGFR is based on the CKD-EPI 2021 equation. To calculate  the new eGFR from a previous Creatinine or Cystatin C result, go to https://www.kidney.org/professionals/ kdoqi/gfr%5Fcalculator          Failed - Valid encounter within last 6 months    Recent Outpatient Visits          9 months ago Type 2 diabetes mellitus with diabetic neuropathy, unspecified whether long term insulin use (HCCCasa [place] BroTeackard, WarCammie McgeeD   1 year ago Type 2 diabetes mellitus with diabetic neuropathy, unspecified whether long term insulin use (HCCFort Myers Shores BroPort O'Connorckard, WarCammie McgeeD   1 year ago Gynecologic exam normal   BroRussellrNoemi Chapel NP   1 year ago Type 2 diabetes mellitus with other circulatory complication, without long-term current use of insulin (HCCTroxelville BroHial[facility] Hospitaldicine Pickard, WarCammie McgeeD   1 year ago Injury of left toe,  initial encounter   [place], Modena Nunnery, MD      Future Appointments            In 1 month Fletcher Anon, Mertie Clause, MD [place] A Dept Of [place]. [facility]           Passed - Cr in normal range and within 360 days    Creat  Date Value Ref Range Status  07/30/2021 1.05 0.50 - 1.05 mg/dL Final   Creatinine, Urine  Date Value Ref Range Status  02/18/2019 16 (L) 20 - 275 mg/dL Final         Passed - HBA1C is between 0 and 7.9 and within 180 days    Hgb A1c MFr Bld  Date Value Ref Range Status  07/30/2021 6.3 (H) <5.7 % of total Hgb Final    Comment:    For someone without known diabetes, a hemoglobin  A1c value between 5.7% and 6.4% is consistent with prediabetes and should be confirmed with a  follow-up test. . For someone with known diabetes, a value <7% indicates that their diabetes is  well controlled. A1c targets should be individualized based on duration of diabetes, age, comorbid conditions, and other considerations. . This assay result is consistent with an increased risk of diabetes. . Currently, no consensus exists regarding use of hemoglobin A1c for diagnosis of diabetes for children. .          Passed - B12 Level in normal range and within 720 days    Vitamin B-12  Date Value Ref Range Status  05/08/2020 313 200 - 1,100 pg/mL Final    Comment:    . Please Note: Although the reference range for vitamin B12 is [phone] pg/mL, it has been reported that between 5 and 10% of patients with values between 200 and 400 pg/mL may experience neuropsychiatric and hematologic abnormalities due to occult B12 deficiency; less than 1% of patients with values above 400 pg/mL will have symptoms. .          Passed - CBC within normal limits and completed in the last 12 months    WBC  Date Value Ref Range Status  07/30/2021 5.0 3.8 - 10.8 Thousand/uL Final   RBC  Date Value Ref Range Status  07/30/2021 4.41 3.80 - 5.10 Million/uL Final   Hemoglobin  Date Value Ref Range Status  07/30/2021 12.4 11.7 - 15.5 g/dL Final  05/03/2019 12.5 11.1 - 15.9 g/dL Final   HCT  Date Value Ref Range Status  07/30/2021 38.6 35.0 - 45.0 % Final   Hematocrit  Date Value Ref Range Status  05/03/2019 40.1 34.0 - 46.6 % Final   MCHC  Date Value Ref Range Status  07/30/2021 32.1 32.0 - 36.0 g/dL Final   [facility]  Date Value Ref Range Status  07/30/2021 28.1 27.0 - 33.0 pg Final   MCV  Date Value Ref Range Status  07/30/2021 87.5 80.0 - 100.0 fL Final  05/03/2019 91 79 - 97 fL Final   No results found for: "PLTCOUNTKUC", "LABPLAT", "POCPLA" RDW  Date Value Ref Range Status  07/30/2021 12.9 11.0 - 15.0 % Final  05/03/2019 13.3 11.7 - 15.4 % Final

## 2021-10-22 ENCOUNTER — Telehealth: Payer: Self-pay | Admitting: Family Medicine

## 2021-10-22 NOTE — Telephone Encounter (Signed)
Patient requesting call back. Test strips received were the wrong ones for the new meter she's waiting for. Stated she's unable to receive the meter until she has the correct test strips.   Name of strips needed: Accu-check guide me meter test strtips.   Pharmacy confirmed as   [id number] [place], [place] - [address]  [address] [place] [address]  Phone:  [phone]  Fax:  [phone]  DEA #:  [id number]  LOV: 09/30/21  Patient also stated insurance company needs explanation of why patient is unable to take gabapentin (stated it makes her brain feel like it's shaking; makes her feel weird - allergic). Pregabalin prescribed instead.   Please advise at [phone]. Patient has to go to an appt. Requesting detailed msg on vmail.

## 2021-10-28 ENCOUNTER — Other Ambulatory Visit: Payer: Self-pay

## 2021-10-28 ENCOUNTER — Telehealth: Payer: Self-pay

## 2021-10-28 DIAGNOSIS — I739 Peripheral vascular disease, unspecified: Secondary | ICD-10-CM

## 2021-10-28 DIAGNOSIS — M1710 Unilateral primary osteoarthritis, unspecified knee: Secondary | ICD-10-CM

## 2021-10-28 DIAGNOSIS — M79604 Pain in right leg: Secondary | ICD-10-CM

## 2021-10-28 MED ORDER — PREGABALIN 50 MG PO CAPS: 50.0000 mg | ORAL_CAPSULE | Freq: Three times a day (TID) | ORAL | 3 refills | Status: DC

## 2021-10-28 NOTE — Telephone Encounter (Addendum)
Nhyira Randa Evens [place] Reusing - Rx #: [id number] Need help? Call us at [phone] Archivedon September 6 Outcome Approvedon September 6 Drug Pregabalin 50MG  capsules Form [employer] Electronic PA Form [phone] NCPDP)

## 2021-11-02 ENCOUNTER — Other Ambulatory Visit: Payer: Self-pay

## 2021-11-02 NOTE — Patient Instructions (Signed)
Visit Information  Thank you for taking time to visit with me today. Please don't hesitate to contact me if I can be of assistance to you before our next scheduled telephone appointment.  Following are the goals we discussed today:   Goals Addressed             This Visit's Progress    COMPLETED: Aging Gracefully Patient will verbalize DM management strategies over the next 4 months       Aging Gracefully RN  08/04/21 Assessment: Per Client, received report from provider office that A1C is good, but she does not know the level. Usually eats two meals/day. She does not eat breakfast. Antinausea medication for stomach issues. She Is very receptive to diabetes education reinforcement. Per chart A1C 6.3 on 07/30/61.  Interventions: Provide Diabetes booklet Encourage to take medications as prescribed Check blood sugar as prescribed and notify provider if consistently outside recommended range. Target 80-130 before meals and <180 after meals. Encouraged to contact provider with any questions or concerns  Thea Silversmith, RN, MSN, BSN, CCM Aging Gracefully Care Management Coordinator [phone]   09/01/2021 Assessment:  Reviewed current CBG readings of 80-100. Reports taking medications as prescribed. Exercising daily.  Interventions:  Reviewed home exercise plan. Encouraged patient to do her exercises daily for increase strength of her legs.  Reviewed precautions of checking feet.  Plan: follow up home visit planned in 1 month.  Tomasa Rand RN, BSN, CEN RN Case Freight forwarder for [place] Mobile: [phone]   10/06/2021 Assessment:  Patient reports CBG normal. Reports taking all medication as prescribed.  Last A1C f 6.3.   Is in the process of getting a new meter .  Intervention: Encouraged patient to continue to self monitor and report any abnormal readings to MD.  Encouraged patient to call me or the pharmacy if she has problems setting up her new meter.    Plan: will see patient in 1 month for follow up.  Tomasa Rand RN, BSN, CEN RN Case Freight forwarder for [employer] Mobile: [phone]   11/02/2021 Assessment:  DM under good control.   Reports difficulty getting test strips.   Waiting for new test strips to be mailed.  Interventions: encouraged patient to continue to monitor CBG and call MD for changes in condition.  Plan: goals met  Tomasa Rand RN, BSN, CEN RN Case Freight forwarder for [place] Mobile: [phone]           If you are experiencing a [place] or [place] or need someone to talk to, please call the Suicide and Crisis Lifeline: 988 call the [place] National Suicide Prevention Lifeline: 205-295-6898 or TTY: 760-243-3494 TTY 318-192-2484) to talk to a trained counselor call 1-800-273-TALK (toll free, 24 hour hotline) go to [facility] [place] [phone]) call 911   The patient verbalized understanding of instructions, educational materials, and care plan provided today and agreed to receive a mailed copy of patient instructions, educational materials, and care plan.   Tomasa Rand RN, BSN, Careers information officer for [employer] Mobile: [phone]

## 2021-11-02 NOTE — Patient Outreach (Signed)
Aging Gracefully Program  RN Visit  11/02/2021  [name] 07-18-1955 [id number]  Visit:   RN home visit #4  Start Time:   7341 End Time:   1038 Total Minutes:   31  Readiness To Change Score:     Universal RN Interventions: Calendar Distribution: Yes Medications: Yes Medication Changes: No Mood: No Pain: Yes PCP Advocacy/Support: No Fall Prevention: Yes Incontinence: No Clinician View Of Client Situation: ambulating without difficulty. home neat and clean. Client View Of His/Her Situation: Patient reports continued [facility] in [facility]  reports waiting for test strips. reorts the scanning device did not work.   CBG range 100's.  pain today 5/10.  Out of lyrcia.  continues to be active.  Healthcare Provider Communication: Did Higher education careers adviser With [employer] Provider?: No According to Client, Did PCP Report Communication With An Aging Gracefully RN?: No  Clinician View of Client Situation: Clinician View Of Client Situation: ambulating without difficulty. home neat and clean. Client's View of His/Her Situation: Client View Of His/Her Situation: Patient reports continued frustration in [facility].  reports waiting for test strips. reorts the scanning device did not work.   CBG range 100's.  pain today 5/10.  Out of lyrcia.  continues to be active.  Medication Assessment:denies changes to medications. Reports difficulty getting lyrica and creon.  [facility] [address] [facility] [address]  Prior authorization was approved on 10/20/2021    OT Update: home modifications almost complete  Session Summary: doing well. Assisted with getting RX.   Goals Addressed             This Visit's Progress    COMPLETED: Aging Gracefully Patient will verbalize DM management strategies over the next 4 months       Aging Gracefully RN  08/04/21 Assessment: Per Client, received report from provider office that A1C  is good, but she does not know the level. Usually eats two meals/day. She does not eat breakfast. Antinausea medication for stomach issues. She Is very receptive to diabetes education reinforcement. Per chart A1C 6.3 on 07/30/61.  Interventions: Provide Diabetes booklet Encourage to take medications as prescribed Check blood sugar as prescribed and notify provider if consistently outside recommended range. Target 80-130 before meals and <180 after meals. Encouraged to contact provider with any questions or concerns  Thea Silversmith, RN, MSN, BSN, CCM Aging Gracefully Care Management Coordinator [phone]   09/01/2021 Assessment:  Reviewed current CBG readings of 80-100. Reports taking medications as prescribed. Exercising daily.  Interventions:  Reviewed home exercise plan. Encouraged patient to do her exercises daily for increase strength of her legs.  Reviewed precautions of checking feet.  Plan: follow up home visit planned in 1 month.  Tomasa Rand RN, BSN, CEN RN Case Freight forwarder for [place] Mobile: [phone]   10/06/2021 Assessment:  Patient reports CBG normal. Reports taking all medication as prescribed.  Last A1C f 6.3.   Is in the process of getting a new meter .  Intervention: Encouraged patient to continue to self monitor and report any abnormal readings to MD.  Encouraged patient to call me or the pharmacy if she has problems setting up her new meter.   Plan: will see patient in 1 month for follow up.  Tomasa Rand RN, BSN, CEN RN Case Freight forwarder for [facility] Mobile: [phone]   11/02/2021 Assessment:  DM under good control.   Reports difficulty getting  test strips.   Waiting for new test strips to be mailed.  Interventions: encouraged patient to continue to monitor CBG and call MD for changes in condition.  Plan: goals met  Tomasa Rand RN, BSN, CEN RN Case Freight forwarder for [place] Mobile: [phone]        Tomasa Rand RN, BSN, Careers information officer for [employer] Mobile: [phone]

## 2021-11-04 NOTE — Telephone Encounter (Signed)
Patient called to follow up on prior auth for pregabalin. Stated she spoke with the insurance company and they haven't received the appeal or spoken with anyone at this office since August. Patient has been without medication for over a month and is requesting for appeal to be sent in asap.  Patient requesting call back with update. Please advise at [phone], or call husband's cell [phone] if she doesn't answer. Stated she has to go out for an appointment shortly.

## 2021-11-08 ENCOUNTER — Other Ambulatory Visit: Payer: Self-pay | Admitting: Occupational Therapy

## 2021-11-08 ENCOUNTER — Telehealth: Payer: Self-pay

## 2021-11-08 NOTE — Telephone Encounter (Signed)
Called pt's insurance, [employer]. Was able to speak with Dena and explained situation to appeal denial request for pt's Pregabalin. Dena was able to send me the correct appeal form. Form faxed to [employer] at [phone] along with chart notes. Pt advised and verbalized understanding.

## 2021-11-09 ENCOUNTER — Other Ambulatory Visit (HOSPITAL_COMMUNITY): Payer: Self-pay

## 2021-11-10 ENCOUNTER — Other Ambulatory Visit (HOSPITAL_COMMUNITY): Payer: Self-pay

## 2021-11-10 DIAGNOSIS — E1151 Type 2 diabetes mellitus with diabetic peripheral angiopathy without gangrene: Secondary | ICD-10-CM | POA: Diagnosis not present

## 2021-11-10 DIAGNOSIS — E785 Hyperlipidemia, unspecified: Secondary | ICD-10-CM | POA: Diagnosis not present

## 2021-11-10 DIAGNOSIS — I25118 Atherosclerotic heart disease of native coronary artery with other forms of angina pectoris: Secondary | ICD-10-CM | POA: Diagnosis not present

## 2021-11-10 DIAGNOSIS — E669 Obesity, unspecified: Secondary | ICD-10-CM | POA: Diagnosis not present

## 2021-11-10 DIAGNOSIS — K219 Gastro-esophageal reflux disease without esophagitis: Secondary | ICD-10-CM | POA: Diagnosis not present

## 2021-11-10 DIAGNOSIS — I10 Essential (primary) hypertension: Secondary | ICD-10-CM | POA: Diagnosis not present

## 2021-11-10 DIAGNOSIS — E1140 Type 2 diabetes mellitus with diabetic neuropathy, unspecified: Secondary | ICD-10-CM | POA: Diagnosis not present

## 2021-11-10 DIAGNOSIS — Z6834 Body mass index (BMI) 34.0-34.9, adult: Secondary | ICD-10-CM | POA: Diagnosis not present

## 2021-11-10 DIAGNOSIS — J45909 Unspecified asthma, uncomplicated: Secondary | ICD-10-CM | POA: Diagnosis not present

## 2021-11-10 DIAGNOSIS — K8689 Other specified diseases of pancreas: Secondary | ICD-10-CM | POA: Diagnosis not present

## 2021-11-10 DIAGNOSIS — M1990 Unspecified osteoarthritis, unspecified site: Secondary | ICD-10-CM | POA: Diagnosis not present

## 2021-11-10 DIAGNOSIS — I70219 Atherosclerosis of native arteries of extremities with intermittent claudication, unspecified extremity: Secondary | ICD-10-CM | POA: Diagnosis not present

## 2021-11-11 ENCOUNTER — Telehealth: Payer: Self-pay

## 2021-11-11 ENCOUNTER — Other Ambulatory Visit: Payer: Self-pay | Admitting: Family Medicine

## 2021-11-11 MED ORDER — HYDROCODONE-ACETAMINOPHEN 5-325 MG PO TABS
1.0000 | ORAL_TABLET | Freq: Four times a day (QID) | ORAL | 0 refills | Status: DC | PRN
Start: 2021-11-11 — End: 2021-12-14

## 2021-11-11 NOTE — Patient Instructions (Signed)
Feel with getting things up high (the reacher can help with this too as well as a heavy duty step stool with handle) MET  ACTION PLANNING - FUNCTIONAL MOBILITY Target Problem Area: Safety getting items out of higher cabinets  Why Problem May Occur: Upper cabinets too high  Target Goal: Safety with getting items out of higher cabinets (heavy duty step stool with handle and reacher)   STRATEGIES Modifying your home environment and making it safe: DO: DON'T:  Do use step stool to get items out of higher cabinets along with reacher if item is appropriate for reacher use (not too heavy and non breakable) Don't stand on your tip toes on step stool, if it is that high then get someone else that is more capable to get it down for you OR if it requires two hands and is heavy--someone else should get it down for you for safety reasons.  Do hold onto other surface that his steady with one hand while you reach up with other hand or reacher to get item(s) Don't pick up glass items with reacher  Provide adequate lighting Don't have dim lights or lights that cast a lot of shadows   Practice It is important to practice the strategies so we can determine if they will be effective in helping to reach your goal. Follow these specific recommendations: Always have at least one hand on the handle of the step stool when stepping up or down from it. Always have one hand on a steady surface while you are reaching up to get an item. May need someone next to you to get the item from you as you lower it down from upper cabinet  If a strategy does not work the first time, try it again and again (and maybe again). We may make some changes over the next few sessions, based on how they work.  Golden Circle, OTR/L      11/08/2021

## 2021-11-11 NOTE — Patient Outreach (Signed)
Aging Gracefully Program  OT Follow-Up Visit  11/11/2021  [name] [birth year] [id number]  Visit:  3- Third Visit  Start Time:  2202 End Time:  5427 Total Minutes:  30  Readiness to Change Score :  Readiness to Change Score: 6.67  Durable Medical Equipment: Durable Medical Equipment: Other (heavy duty step stool with handle) Durable Medical Equipment Distribution Date: 11/08/21 Adaptive Equipment: Reacher Adaptive Equipment Distribution Date: 11/08/21  Goals:   Goals Addressed             This Visit's Progress    COMPLETED: Patient Stated       She would like to be able to get her LB dressed more easily (sock aid, reacher, long shoe horn, dressing stick). MET  Action Planning--Dressing Target Problem Area: Intermittent trouble with LBD  Why Problem May Occur: Increased pain, decreased bend in knees due to knee surgeries.  Target Goal: Independence/increased ease on all days for LBD no matter what her pain may be on a given day   STRATEGIES Saving Your Energy: DO: DON'T:  Sit down to get dressed (standing takes more energy) Stand too long while dressing  Use appropriate adaptive equipment:  long handled shoehorn, sock aide, reacher   Keep all items you'll need within easy reach   Simplifying the way you set up tasks or daily routines: DO: DON'T:  Gather all items before getting started--so you are not up and down a lot when working on getting dressed.   Wear clothing that is easily manipulated    Practice It is important to practice the strategies so we can determine if they will be effective in helping to reach your goal. Follow these specific recommendations: 1.Use the AE regularly so you get used to using it  If a strategy does not work the first time, try it again and again (and maybe again). We may make some changes over the next few sessions, based on how they work.  Golden Circle, OTR/L       09/17/2021     COMPLETED: Patient Stated        Feel with getting things up high (the reacher can help with this too as well as a heavy duty step stool with handle) MET  ACTION PLANNING - FUNCTIONAL MOBILITY Target Problem Area: Safety getting items out of higher cabinets  Why Problem May Occur: Upper cabinets too high  Target Goal: Safety with getting items out of higher cabinets (heavy duty step stool with handle and reacher)   STRATEGIES Modifying your home environment and making it safe: DO: DON'T:  Do use step stool to get items out of higher cabinets along with reacher if item is appropriate for reacher use (not too heavy and non breakable) Don't stand on your tip toes on step stool, if it is that high then get someone else that is more capable to get it down for you OR if it requires two hands and is heavy--someone else should get it down for you for safety reasons.  Do hold onto other surface that his steady with one hand while you reach up with other hand or reacher to get item(s) Don't pick up glass items with reacher  Provide adequate lighting Don't have dim lights or lights that cast a lot of shadows  Practice It is important to practice the strategies so we can determine if they will be effective in helping to reach your goal. Follow these specific recommendations: Always have at least one hand on the  handle of the step stool when stepping up or down from it. Always have one hand on a steady surface while you are reaching up to get an item. May need someone next to you to get the item from you as you lower it down from upper cabinet  If a strategy does not work the first time, try it again and again (and maybe again). We may make some changes over the next few sessions, based on how they work.  Golden Circle, OTR/L      11/08/2021          Post Clinical Reasoning: Clinician View Of Client Situation:: [name] is doing well. She is still trying to get medications figured out with her new health plan and starting  to think about what Medicare plan she wants to go with when she turns 81 in Feb of 2024. Client View Of His/Her Situation:: She is happy with all of their home modifications and equipment that she has received. Next Visit Plan:: It will be my final visit, will go over educational material.  Golden Circle, OTR/L [place] [phone] Office [phone]

## 2021-11-11 NOTE — Telephone Encounter (Signed)
Pt called in to request a refill of  HYDROcodone-acetaminophen (NORCO/VICODIN) 5-325 MG tablet [[id number]]    Order Details Dose: 1 tablet Route: Oral Frequency: Every 6 hours PRN for moderate pain    LOV: 09/30/21  [facility] [address]

## 2021-11-17 ENCOUNTER — Encounter (HOSPITAL_COMMUNITY): Payer: 59

## 2021-11-22 ENCOUNTER — Other Ambulatory Visit: Payer: Self-pay | Admitting: Family Medicine

## 2021-11-23 ENCOUNTER — Encounter: Payer: Self-pay | Admitting: Cardiovascular Disease

## 2021-11-23 ENCOUNTER — Ambulatory Visit: Payer: 59 | Admitting: Cardiovascular Disease

## 2021-11-23 ENCOUNTER — Ambulatory Visit (HOSPITAL_COMMUNITY)
Admission: RE | Admit: 2021-11-23 | Discharge: 2021-11-23 | Disposition: A | Discharge status: Home / Self Care | Payer: 59 | Source: Ambulatory Visit | Attending: Cardiovascular Disease | Admitting: Cardiovascular Disease

## 2021-11-23 VITALS — BP 132/68 | HR 74 | Ht 63.0 in | Wt 187.4 lb

## 2021-11-23 DIAGNOSIS — I25118 Atherosclerotic heart disease of native coronary artery with other forms of angina pectoris: Secondary | ICD-10-CM | POA: Insufficient documentation

## 2021-11-23 DIAGNOSIS — E785 Hyperlipidemia, unspecified: Secondary | ICD-10-CM | POA: Insufficient documentation

## 2021-11-23 DIAGNOSIS — I739 Peripheral vascular disease, unspecified: Secondary | ICD-10-CM | POA: Insufficient documentation

## 2021-11-23 DIAGNOSIS — Z95820 Peripheral vascular angioplasty status with implants and grafts: Secondary | ICD-10-CM | POA: Insufficient documentation

## 2021-11-23 NOTE — Progress Notes (Signed)
Cardiology Office Note   Date:  11/23/2021   ID:  [name], [name] 02-12-1956, MRN [id number]  PCP:  Susy Frizzle, MD  Cardiologist:  Fletcher Anon  Chief Complaint  Patient presents with   Follow-up       History of Present Illness: [name] is a 66 y.o. female who presents for a follow up visit regarding regarding peripheral arterial disease and coronary artery disease. She has known history of peripheral arterial disease status post atherectomy of the right SFA in 2011 by Dr. Julianne Handler. She has other chronic medical conditions including CAD, DM, HTN, chronic back pain, recurrent idiopathic pancreatitis and hyperlipidemia. She is a former smoker.  She is s/p bilateral TKR.   She had worsening right leg claudication in April 2017. Angiography showed no significant aortoiliac disease. There was moderate right common femoral artery stenosis and severe discrete stenosis in the proximal right SFA with three-vessel runoff below the knee. There was no significant obstructive disease involving the left lower extremity. A self-expanding stent was placed to the proximal right SFA. She had previous bilateral knee replacement and chronic bilateral leg pain.   She had worsening left leg claudication in 2021.  Vascular studies showed mildly reduced ABI bilaterally in the 0.8 range.  Duplex showed moderate right common femoral artery stenosis and moderate SFA disease.  On the left, there was severe new stenosis in the proximal SFA. Angiography was done in March of 2021 which showed borderline stenosis affecting the left common iliac artery, severe calcified stenosis affecting the left proximal SFA and three-vessel runoff below the knee.  On the right, there was severe calcified disease affecting the common femoral artery with patent proximal SFA stent followed by 60% disease throughout the whole SFA and three-vessel runoff below the knee.  I performed successful orbital atherectomy  and drug-coated balloon angioplasty to the left SFA.   She underwent staged right common femoral artery endarterectomy by Dr. Carlis Abbott in August of 2021.  She underwent Doppler studies today which showed a drop in ABI on the right side to 0.75 and normal on the left at 0.98.  Duplex showed moderately elevated velocities in the right SFA and popliteal arteries in the 300 range.  No obstructive disease on the left.  She reports mild right calf claudication.  She continues to have significant numbness in both legs.  She is concerned about her husband who is having abdominal aortic aneurysm repair tomorrow.  She reports mild exertional dyspnea with no chest pain.   Past Medical History:  Diagnosis Date   Aortic atherosclerosis ([place])    Asthma    CAD (coronary artery disease)    a. [place] 2011: Diffuse distal and branch vessel disease - patient managed medically, no interventional options. b. [place] 03/2014 - low risk, no ischemia.   Complication of anesthesia    slow to wake up with last surgery in 2011    Diabetes mellitus with circulatory complication ([place])    Diverticulosis    Dyslipidemia    GERD (gastroesophageal reflux disease)    Headache    hx of migraines    Hypertension    Osteoarthritis    severe right knee  R TKR   PAD (peripheral artery disease) ([place])    a. Severe stenosis mid right SFA s/p atherectomy 03/25/09. b. peripheral angiography in 12/2011 which showed only about 50% diffuse right SFA stenosis.   Pancreatitis 2003   Renal artery stenosis ([place])    a. [place] 2011 -  40-50% left RAS.   Tobacco use disorder    quit 11/10    Past Surgical History:  Procedure Laterality Date   ABDOMINAL AORTAGRAM N/A 12/21/2011   Procedure: ABDOMINAL AORTAGRAM;  Surgeon: Wellington Hampshire, MD;  Location: [place] [facility];  Service: Cardiovascular;  Laterality: N/A;   ABDOMINAL AORTOGRAM W/LOWER EXTREMITY Bilateral 05/08/2019   Procedure: ABDOMINAL AORTOGRAM W/LOWER EXTREMITY;  Surgeon: Wellington Hampshire,  MD;  Location: [place] [facility];  Service: Cardiovascular;  Laterality: Bilateral;   CARDIAC CATHETERIZATION     ENDARTERECTOMY FEMORAL Right 10/10/2019   Procedure: RIGHT FEMORAL ENDARTERECTOMY;  Surgeon: Marty Heck, MD;  Location: [place];  Service: Vascular;  Laterality: Right;   ESOPHAGOGASTRODUODENOSCOPY (EGD) WITH PROPOFOL N/A 10/05/2017   Procedure: ESOPHAGOGASTRODUODENOSCOPY (EGD) WITH PROPOFOL;  Surgeon: Milus Banister, MD;  [facility];  Service: Endoscopy;  Laterality: N/A;   EUS N/A 10/05/2017   Procedure: UPPER ENDOSCOPIC ULTRASOUND (EUS) RADIAL;  Surgeon: Milus Banister, MD;  [facility];  Service: Endoscopy;  Laterality: N/A;   FOOT SURGERY Left    LOWER EXTREMITY ANGIOGRAM Bilateral 05/27/2015   Procedure: Lower Extremity Angiogram;  Surgeon: Wellington Hampshire, MD;  Location: [place] [facility];  Service: Cardiovascular;  Laterality: Bilateral;   PATCH ANGIOPLASTY Right 10/10/2019   Procedure: PATCH ANGIOPLASTY USING Rueben Bash BIOLOGIC PATCH;  Surgeon: Marty Heck, MD;  Location: [place];  Service: Vascular;  Laterality: Right;   PERIPHERAL VASCULAR ATHERECTOMY Left 05/08/2019   Procedure: PERIPHERAL VASCULAR ATHERECTOMY;  Surgeon: Wellington Hampshire, MD;  Location: [place] [facility];  Service: Cardiovascular;  Laterality: Left;   PERIPHERAL VASCULAR CATHETERIZATION N/A 05/27/2015   Procedure: Abdominal Aortogram;  Surgeon: Wellington Hampshire, MD;  Location: [place] [facility];  Service: Cardiovascular;  Laterality: N/A;   PERIPHERAL VASCULAR CATHETERIZATION Right 05/27/2015   Procedure: Peripheral Vascular Intervention;  Surgeon: Wellington Hampshire, MD;  Location: [place] [facility];  Service: Cardiovascular;  Laterality: Right;  SFA   TOTAL KNEE ARTHROPLASTY Right 2011   TOTAL KNEE ARTHROPLASTY Left 05/22/2014   Procedure: LEFT TOTAL KNEE ARTHROPLASTY;  Surgeon: Latanya Maudlin, MD;  [facility];  Service: Orthopedics;  Laterality: Left;    TRIGGER FINGER RELEASE Left 07/07/2021   Procedure: LEFT INDEX FINGER RELEASE TRIGGER FINGER/A-1 PULLEY / CARPOMETACARPAL INJECTION;  Surgeon: Sherilyn Cooter, MD;  Location: [place];  Service: Orthopedics;  Laterality: Left;   TUBAL LIGATION  1980     Current Outpatient Medications  Medication Sig Dispense Refill   acetaminophen (TYLENOL) 500 MG tablet Take 1,000 mg by mouth every 6 (six) hours as needed for moderate pain or headache.     albuterol (VENTOLIN HFA) 108 (90 Base) MCG/ACT inhaler INHALE 2 PUFFS INTO THE LUNGS EVERY 6 HOURS AS NEEDED FOR WHEEZING OR SHORTNESS OF BREATH 18 g 3   Biotin w/ Vitamins C & E (HAIR/SKIN/NAILS PO) Take 1 tablet by mouth daily.     bisoprolol (ZEBETA) 5 MG tablet TAKE 1/2 TABLET(2.5 MG) BY MOUTH DAILY 45 tablet 3   clopidogrel (PLAVIX) 75 MG tablet TAKE 1 TABLET(75 MG) BY MOUTH DAILY 90 tablet 3   diclofenac sodium (VOLTAREN) 1 % GEL Apply 2 g topically 4 (four) times daily. (Patient taking differently: Apply 2 g topically 4 (four) times daily as needed (for hand pain).) 100 g 0   fluticasone-salmeterol (ADVAIR DISKUS) 250-50 MCG/ACT AEPB Inhale 1 puff into the lungs in the morning and at bedtime. 60 each 11  glucose blood (CONTOUR NEXT TEST) test strip 1 each by Other route 3 (three) times daily. Use as instructed 100 each 12   HYDROcodone-acetaminophen (NORCO/VICODIN) 5-325 MG tablet Take 1 tablet by mouth every 6 (six) hours as needed for moderate pain. Chronic Pain. Dx: G89.4 130 tablet 0   isosorbide mononitrate (IMDUR) 60 MG 24 hr tablet TAKE 1 TABLET(60 MG) BY MOUTH DAILY 90 tablet 2   lipase/protease/amylase (CREON) 36000 UNITS CPEP capsule Take 2 capsules (72,000 Units total) by mouth 3 (three) times daily with meals. May also take 1 capsule (36,000 Units total) as needed (with snacks). (Patient taking differently: Take 3 capsules (108,000 Units total) by mouth 3 (three) times daily with meals. May also take 1 capsule (36,000 Units  total) as needed (with snacks).) 240 capsule 11   Loratadine (CLARITIN PO) Take 1 tablet by mouth daily.     metFORMIN (GLUCOPHAGE) 1000 MG tablet Take 1 tablet (1,000 mg total) by mouth 2 (two) times daily with a meal. 60 tablet 2   methocarbamol (ROBAXIN) 500 MG tablet TAKE 1 TABLET BY MOUTH EVERY 6 HOURS AS NEEDED FOR MUSCLE SPASM 120 tablet 2   Multiple Vitamin (MULTIVITAMIN) tablet Take 1 tablet by mouth daily.     nitroGLYCERIN (NITROSTAT) 0.4 MG SL tablet Place 1 tablet under the tongue every 5 (five) minutes as needed for chest pain.     ondansetron (ZOFRAN) 4 MG tablet Take 1 tablet (4 mg total) by mouth every 8 (eight) hours as needed for nausea or vomiting. 21 tablet 1   pantoprazole (PROTONIX) 40 MG tablet TAKE 1 TABLET BY MOUTH DAILY 90 tablet 3   pioglitazone (ACTOS) 30 MG tablet TAKE 1 TABLET(30 MG) BY MOUTH DAILY 90 tablet 3   Probiotic Product (TRUBIOTICS PO) Take 1 capsule by mouth daily.     promethazine (PHENERGAN) 25 MG tablet TAKE 1 TABLET(25 MG) BY MOUTH EVERY 8 HOURS AS NEEDED FOR NAUSEA OR VOMITING 30 tablet 0   rosuvastatin (CRESTOR) 40 MG tablet TAKE 1 TABLET(40 MG) BY MOUTH EVERY NIGHT 90 tablet 3   sitaGLIPtin (JANUVIA) 100 MG tablet TAKE 1 TABLET(100 MG) BY MOUTH DAILY 90 tablet 1   vitamin B-12 (CYANOCOBALAMIN) 1000 MCG tablet Take 1,000 mcg by mouth daily.     pregabalin (LYRICA) 50 MG capsule Take 1 capsule (50 mg total) by mouth 3 (three) times daily. 90 capsule 3   Current Facility-Administered Medications  Medication Dose Route Frequency Provider Last Rate Last Admin   triamcinolone acetonide (KENALOG) 10 MG/ML injection 10 mg  10 mg Other Once Regal, Norman S, DPM        Allergies:   Gabapentin, Glucotrol [glipizide], Influenza vaccines, Latex, Nortriptyline, Tetanus toxoid, Other, and Invokana [canagliflozin]    Social History:  The patient  reports that she quit smoking about 12 years ago. Her smoking use included cigarettes. She has never used smokeless  tobacco. She reports that she does not drink alcohol and does not use drugs.   Family History:  The patient's family history includes Breast cancer (age of onset: 19) in her sister; CVA in her mother; Emphysema in her father.    ROS:  Please see the history of present illness.   Otherwise, review of systems are positive for .   All other systems are reviewed and negative.    PHYSICAL EXAM: VS:  BP 132/68   Pulse 74   Ht 5\' 3"  (1.6 m)   Wt 187 lb 6.4 oz (85 kg)   BMI  33.20 kg/m  , BMI Body mass index is 33.2 kg/m. GEN: Well nourished, well developed, in no acute distress  HEENT: normal  Neck: no JVD, carotid bruits, or masses Cardiac: RRR; no murmurs, rubs, or gallops,no edema  Respiratory:  clear to auscultation bilaterally, normal work of breathing GI: soft, nontender, nondistended, + BS MS: no deformity or atrophy  Skin: warm and dry, no rash Neuro:  Strength and sensation are intact Psych: euthymic mood, full affect   EKG:  EKG is ordered today. EKG showed normal sinus rhythm with no significant ST or T wave changes.    Recent Labs: 07/30/2021: ALT 16; BUN 23; Creat 1.05; Hemoglobin 12.4; Platelets 184; Potassium 4.4; Sodium 138    Lipid Panel    Component Value Date/Time   CHOL 159 07/30/2021 1144   TRIG 224 (H) 07/30/2021 1144   HDL 53 07/30/2021 1144   CHOLHDL 3.0 07/30/2021 1144   VLDL 35 10/11/2019 0219   LDLCALC 75 07/30/2021 1144   LDLDIRECT 50.6 01/02/2014 1003      Wt Readings from Last 3 Encounters:  11/23/21 187 lb 6.4 oz (85 kg)  09/30/21 184 lb 3.2 oz (83.6 kg)  07/30/21 179 lb (81.2 kg)       ASSESSMENT AND PLAN:  1.  PAD with claudication: Status post left SFA atherectomy and drug-coated balloon angioplasty as well as right common femoral artery endarterectomy.   Right calf claudication is due to diffuse right SFA and popliteal artery disease.  At the present time, her symptoms are not lifestyle limiting.  I discussed with her the  importance of a daily walking program and provided her with instructions.  2. Coronary artery disease involving native coronary arteries with other forms of angina:   Previous cardiac catheterization in 2011 showed distal LAD and small branch disease which is being managed medically.  Most recent [place] in May 2022 showed evidence of mild anterior wall ischemia.  She has been treated medically given stability of symptoms.  3. Hyperlipidemia:  Continue treatment with high-dose rosuvastatin.  I reviewed most recent lipid profile done in June which showed an LDL of 75 which is close to target.  4. Diabetes mellitus: Managed by primary care physician.    Disposition:   FU with me in 6 months  Signed,  Kathlyn Sacramento, MD  11/23/2021 10:51 AM    [place]

## 2021-11-23 NOTE — Patient Instructions (Signed)
Medication Instructions:  Your physician recommends that you continue on your current medications as directed. Please refer to the Current Medication list given to you today.  *If you need a refill on your cardiac medications before your next appointment, please call your pharmacy*  Lab Work: NONE ordered at this time of appointment   If you have labs (blood work) drawn today and your tests are completely normal, you will receive your results only by: [place] (if you have MyChart) OR A paper copy in the mail If you have any lab test that is abnormal or we need to change your treatment, we will call you to review the results.  Testing/Procedures: NONE ordered at this time of appointment   Follow-Up: At [facility] you and your health needs are our priority.  As part of our continuing mission to provide you with exceptional heart care, we have created designated Provider Care Teams.  These Care Teams include your primary Cardiologist (physician) and Advanced Practice Providers (APPs -  Physician Assistants and Nurse Practitioners) who all work together to provide you with the care you need, when you need it.  We recommend signing up for the patient portal called "MyChart".  Sign up information is provided on this After Visit Summary.  MyChart is used to connect with patients for Virtual Visits (Telemedicine).  Patients are able to view lab/test results, encounter notes, upcoming appointments, etc.  Non-urgent messages can be sent to your provider as well.   To learn more about what you can do with MyChart, go to [email or web address].    Your next appointment:   6 month(s)  The format for your next appointment:   In Person  Provider:   Kathlyn Sacramento, MD     Other Instructions  EXERCISE PROGRAM FOR INDIVIDUALS WITH  PERIPHERAL ARTERIAL DISEASE (PAD)   General Information:   Research in vascular exercise has demonstrated remarkable improvement in symptoms of  leg pain (claudication) without expensive or invasive interventions. Regular walking programs are extremely helpful for patients with PAD and intermittent claudication.  These steps are designed to help you get started with a safe and effective program to help you walk farther with less pain:   Walk at least three times a week (preferably every day).  Your goal is to build up to 30-45 minutes of total walking time (not counting rest breaks). It may take you several weeks to build up your exercise time starting at 5-10 minutes or whatever you can tolerate.  Walk as far as possible using moderate to maximal pain (7-8 on the scale below) as a signal to stop, and resume walking when the pain goes away.  On a treadmill, set the speed and grade at a level that brings on the claudication pain within 3 to 5 minutes. Walk at this rate until you experience claudication of moderate severity, rest until the pain improves, and then resume walking.  Over time, you will be able to walk longer at the designated speed and grade; workload should then be increased until you develop the pain within 3 to 5 minutes once again.  This regimen will induce a significant benefit. Studies have demonstrated that participants may be able to walk up to three or four times farther and have less leg pain, within twelve weeks, by following this protocol.  Pain Scale    0_____1_____2_____3_____4_____5_____6_____7_____8_____9_____10   No Pain  Moderate Pain                               Maximal Pain  Important Information About Sugar

## 2021-12-13 ENCOUNTER — Telehealth: Payer: Self-pay

## 2021-12-13 NOTE — Telephone Encounter (Signed)
  Prescription Request  12/13/2021  Is this a "Controlled Substance" medicine? Yes  LOV: 11/22/2021   What is the name of the medication or equipment? HYDROcodone-acetaminophen   Have you contacted your pharmacy to request a refill? No   Which pharmacy would you like this sent to?  [id number] [address], [place] [place] [address] Phone: [phone] Fax: [phone]   Patient notified that their request is being sent to the clinical staff for review and that they should receive a response within 2 business days.   Please advise at [phone]

## 2021-12-14 ENCOUNTER — Other Ambulatory Visit: Payer: Self-pay

## 2021-12-14 ENCOUNTER — Other Ambulatory Visit: Payer: Self-pay | Admitting: Family Medicine

## 2021-12-14 DIAGNOSIS — K219 Gastro-esophageal reflux disease without esophagitis: Secondary | ICD-10-CM

## 2021-12-14 MED ORDER — PANTOPRAZOLE SODIUM 40 MG PO TBEC: 40.0000 mg | DELAYED_RELEASE_TABLET | Freq: Every day | ORAL | 3 refills | Status: DC

## 2021-12-14 MED ORDER — HYDROCODONE-ACETAMINOPHEN 5-325 MG PO TABS
1.0000 | ORAL_TABLET | Freq: Four times a day (QID) | ORAL | 0 refills | Status: DC | PRN
Start: 2021-12-14 — End: 2022-01-11

## 2021-12-14 NOTE — Telephone Encounter (Signed)
  Prescription Request  12/14/2021  Is this a "Controlled Substance" medicine? No  LOV: 11/22/2021   What is the name of the medication or equipment? pantoprazole (PROTONIX) 40 MG tablet [[id number]]   Have you contacted your pharmacy to request a refill? Yes   Which pharmacy would you like this sent to?  [id number] [address], [place] [address] [place] [address] Phone: [phone] Fax: [phone]   Patient notified that their request is being sent to the clinical staff for review and that they should receive a response within 2 business days.   Please advise at [phone]

## 2021-12-17 ENCOUNTER — Other Ambulatory Visit: Payer: Self-pay | Admitting: Occupational Therapy

## 2021-12-17 ENCOUNTER — Other Ambulatory Visit (HOSPITAL_COMMUNITY): Payer: Self-pay

## 2021-12-17 NOTE — Patient Instructions (Addendum)
She would like to feel more safe with showering and toileting in the guest bathroom. (Comfort height toilet with arm frame attachment, walk in shower v. Tub cut out; which ever one with grab bars, hand held shower, and shower seat as well as long handled sponge).Nothing was done with tub in the guest bath, it was decided that Mrs. Ashurst wanted to keep her tub the way it is. The toilet was replaced with a higher toilet and the toilet frame was put on the toilet so there are handles to help get up and down. They already have a shower seat they are using in the other bathroom. Long handled sponge was provided.  Feel with getting things up high (the reacher can help with this too as well as a heavy duty step stool with handle) MET  ACTION PLANNING - FUNCTIONAL MOBILITY Target Problem Area: Safety getting items out of higher cabinets  Why Problem May Occur: Upper cabinets too high  Target Goal: Safety with getting items out of higher cabinets (heavy duty step stool with handle and reacher)   STRATEGIES Modifying your home environment and making it safe: DO: DON'T:  Do use step stool to get items out of higher cabinets along with reacher if item is appropriate for reacher use (not too heavy and non breakable) Don't stand on your tip toes on step stool, if it is that high then get someone else that is more capable to get it down for you OR if it requires two hands and is heavy--someone else should get it down for you for safety reasons.  Do hold onto other surface that his steady with one hand while you reach up with other hand or reacher to get item(s) Don't pick up glass items with reacher  Provide adequate lighting Don't have dim lights or lights that cast a lot of shadows   Practice It is important to practice the strategies so we can determine if they will be effective in helping to reach your goal. Follow these specific recommendations: Always have at least one hand on the handle of the step  stool when stepping up or down from it. Always have one hand on a steady surface while you are reaching up to get an item. May need someone next to you to get the item from you as you lower it down from upper cabinet  If a strategy does not work the first time, try it again and again (and maybe again). We may make some changes over the next few sessions, based on how they work.  [place], OTR/L      11/08/2021   She would like to be able to get her LB dressed more easily (sock aid, reacher, long shoe horn, dressing stick). MET  Action Planning--Dressing Target Problem Area: Intermittent trouble with LBD  Why Problem May Occur: Increased pain, decreased bend in knees due to knee surgeries.  Target Goal: Independence/increased ease on all days for LBD no matter what her pain may be on a given day   STRATEGIES Saving Your Energy: DO: DON'T:  Sit down to get dressed (standing takes more energy) Stand too long while dressing  Use appropriate adaptive equipment:  long handled shoehorn, sock aide, reacher   Keep all items you'll need within easy reach    Simplifying the way you set up tasks or daily routines: DO: DON'T:  Gather all items before getting started--so you are not up and down a lot when working on getting dressed.  Wear clothing that is easily manipulated    Practice It is important to practice the strategies so we can determine if they will be effective in helping to reach your goal. Follow these specific recommendations: 1.Use the AE regularly so you get used to using it  If a strategy does not work the first time, try it again and again (and maybe again). We may make some changes over the next few sessions, based on how they work.  [place] Circle, OTR/L       09/17/2021

## 2021-12-23 ENCOUNTER — Other Ambulatory Visit: Payer: Self-pay | Admitting: Family Medicine

## 2021-12-23 NOTE — Telephone Encounter (Signed)
Requested medications are due for refill today.  unsure  Requested medications are on the active medications list.  yes  Last refill. varied  Future visit scheduled.   no  Notes to clinic.  Refill not delegated.    Requested Prescriptions  Pending Prescriptions Disp Refills   methocarbamol (ROBAXIN) 500 MG tablet [Pharmacy Med Name: METHOCARBAMOL 500 MG TABLET] 120 tablet 2    Sig: TAKE 1 TABLET BY MOUTH EVERY 6 HOURS AS NEEDED FOR MUSCLE SPASM     Not Delegated - Analgesics:  Muscle Relaxants Failed - 12/23/2021  5:41 PM      Failed - This refill cannot be delegated      Failed - Valid encounter within last 6 months    Recent Outpatient Visits           11 months ago Type 2 diabetes mellitus with diabetic neuropathy, unspecified whether long term insulin use (HCC)   [facility] [place], Priscille Heidelberg, MD   1 year ago Type 2 diabetes mellitus with diabetic neuropathy, unspecified whether long term insulin use (HCC)   [facility] [place], Priscille Heidelberg, MD   1 year ago Gynecologic exam normal   [facility] Cathlean Marseilles A, NP   1 year ago Type 2 diabetes mellitus with other circulatory complication, without long-term current use of insulin (HCC)   [facility] [place], Priscille Heidelberg, MD   2 years ago Injury of left toe, initial encounter   [facility] [place], Velna Hatchet, MD               promethazine (PHENERGAN) 25 MG tablet [Pharmacy Med Name: PROMETHAZINE 25 MG TABLET] 30 tablet 0    Sig: TAKE 1 TABLET(25 MG) BY MOUTH EVERY 8 HOURS AS NEEDED FOR NAUSEA OR VOMITING     Not Delegated - Gastroenterology: Antiemetics Failed - 12/23/2021  5:41 PM      Failed - This refill cannot be delegated      Failed - Valid encounter within last 6 months    Recent Outpatient Visits           11 months ago Type 2 diabetes mellitus with diabetic neuropathy, unspecified whether long term insulin use (HCC)   [facility] [place], Priscille Heidelberg, MD   1 year ago Type 2 diabetes mellitus with diabetic neuropathy, unspecified whether long term insulin use (HCC)   [facility] [place], Priscille Heidelberg, MD   1 year ago Gynecologic exam normal   [facility] Cathlean Marseilles A, NP   1 year ago Type 2 diabetes mellitus with other circulatory complication, without long-term current use of insulin (HCC)   [facility] [place], Priscille Heidelberg, MD   2 years ago Injury of left toe, initial encounter   [facility] [place], Velna Hatchet, MD

## 2021-12-24 ENCOUNTER — Other Ambulatory Visit: Payer: Self-pay | Admitting: Family Medicine

## 2021-12-24 DIAGNOSIS — I739 Peripheral vascular disease, unspecified: Secondary | ICD-10-CM

## 2021-12-24 DIAGNOSIS — M1710 Unilateral primary osteoarthritis, unspecified knee: Secondary | ICD-10-CM

## 2021-12-24 DIAGNOSIS — M79604 Pain in right leg: Secondary | ICD-10-CM

## 2021-12-24 NOTE — Telephone Encounter (Signed)
Requested medications are due for refill today.  no  Requested medications are on the active medications list.  yes  Last refill. 10/28/2021 #90 3 rf - Rx was printed  Future visit scheduled.   no  Notes to clinic.  Refill not delegated    Requested Prescriptions  Pending Prescriptions Disp Refills   pregabalin (LYRICA) 50 MG capsule 90 capsule 3    Sig: Take 1 capsule (50 mg total) by mouth 3 (three) times daily.     Not Delegated - Neurology:  Anticonvulsants - Controlled - pregabalin Failed - 12/24/2021  3:27 PM      Failed - This refill cannot be delegated      Passed - Cr in normal range and within 360 days    Creat  Date Value Ref Range Status  07/30/2021 1.05 0.50 - 1.05 mg/dL Final   Creatinine, Urine  Date Value Ref Range Status  02/18/2019 16 (L) 20 - 275 mg/dL Final         Passed - Completed PHQ-2 or PHQ-9 in the last 360 days      Passed - Valid encounter within last 12 months    Recent Outpatient Visits           11 months ago Type 2 diabetes mellitus with diabetic neuropathy, unspecified whether long term insulin use (HCC)   [facility] Pick[place], Priscille Heidelberg, MD   1 year ago Type 2 diabetes mellitus with diabetic neuropathy, unspecified whether long term insulin use (HCC)   [facility] Pickard, Priscille Heidelberg, MD   1 year ago Gynecologic exam normal   [facility] Valentino Nose, NP   1 year ago Type 2 diabetes mellitus with other circulatory complication, without long-term current use of insulin (HCC)   [facility] Pickard, Priscille Heidelberg, MD   2 years ago Injury of left toe, initial encounter   [facility] [place], Velna Hatchet, MD

## 2021-12-24 NOTE — Telephone Encounter (Signed)
    Prescription Request  12/24/2021  Is this a "Controlled Substance" medicine? No  LOV: 12/23/2021   What is the name of the medication or equipment?   pregabalin (LYRICA) 50 MG capsule   Have you contacted your pharmacy to request a refill? Yes   Which pharmacy would you like this sent to?  [id number] [place], [place] - [address] [place] [address] Phone: [phone] Fax: [phone]   Patient notified that their request is being sent to the clinical staff for review and that they should receive a response within 2 business days.   Please advise at  [phone].   **Patient called to follow up on appeal approval to see when the refill will be sent in. **

## 2021-12-27 MED ORDER — PREGABALIN 50 MG PO CAPS: 50.0000 mg | ORAL_CAPSULE | Freq: Three times a day (TID) | ORAL | 3 refills | Status: DC

## 2021-12-29 ENCOUNTER — Telehealth: Payer: Self-pay

## 2021-12-29 NOTE — Telephone Encounter (Signed)
LETTER RECEIVED FROM [employer] THAT LYRICA APPEAL HAS BEEN APPROVED FOR 12 MONTHS.

## 2022-01-07 ENCOUNTER — Other Ambulatory Visit: Payer: Self-pay | Admitting: Family Medicine

## 2022-01-11 ENCOUNTER — Other Ambulatory Visit: Payer: Self-pay | Admitting: Family Medicine

## 2022-01-11 ENCOUNTER — Telehealth: Payer: Self-pay | Admitting: Family Medicine

## 2022-01-11 MED ORDER — HYDROCODONE-ACETAMINOPHEN 5-325 MG PO TABS
1.0000 | ORAL_TABLET | Freq: Four times a day (QID) | ORAL | 0 refills | Status: DC | PRN
Start: 2022-01-11 — End: 2022-02-10

## 2022-01-11 NOTE — Telephone Encounter (Signed)
  Prescription Request  01/11/2022  Is this a "Controlled Substance" medicine? Yes  LOV: 01/07/2022   What is the name of the medication or equipment?   HYDROcodone-acetaminophen (NORCO/VICODIN) 5-325 MG tablet   Have you contacted your pharmacy to request a refill? Yes   Which pharmacy would you like this sent to?  [id number] [place], [place] - [address] [place] [address] Phone: [phone] Fax: [phone]   Patient notified that their request is being sent to the clinical staff for review and that they should receive a response within 2 business days.   Please advise patient at [phone].

## 2022-01-12 NOTE — Telephone Encounter (Signed)
Requested medication (s) are due for refill today: due 01/20/22  Requested medication (s) are on the active medication list: yes    Last refill: 10/21/21  #120   2 refills  Future visit scheduled no  Notes to clinic:Not delegated, please review. Thank you.  Requested Prescriptions  Pending Prescriptions Disp Refills   methocarbamol (ROBAXIN) 500 MG tablet [Pharmacy Med Name: METHOCARBAMOL 500 MG TABLET] 120 tablet 2    Sig: TAKE 1 TABLET BY MOUTH EVERY 6 HOURS AS NEEDED FOR MUSCLE SPASM     Not Delegated - Analgesics:  Muscle Relaxants Failed - 01/11/2022  7:10 PM      Failed - This refill cannot be delegated      Failed - Valid encounter within last 6 months    Recent Outpatient Visits           1 year ago Type 2 diabetes mellitus with diabetic neuropathy, unspecified whether long term insulin use (HCC)   [facility] Pickard, Priscille Heidelberg, MD   1 year ago Type 2 diabetes mellitus with diabetic neuropathy, unspecified whether long term insulin use (HCC)   [facility] Pickard, Priscille Heidelberg, MD   1 year ago Gynecologic exam normal   [facility] Valentino Nose, NP   1 year ago Type 2 diabetes mellitus with other circulatory complication, without long-term current use of insulin (HCC)   [facility] Pickard, Priscille Heidelberg, MD   2 years ago Injury of left toe, initial encounter   [facility] [place], Velna Hatchet, MD

## 2022-01-13 ENCOUNTER — Other Ambulatory Visit: Payer: Self-pay | Admitting: Family Medicine

## 2022-01-13 NOTE — Telephone Encounter (Signed)
Unable to refill per protocol, Rx was refuse due to refill was too soon. Will refuse duplicate request.  Requested Prescriptions  Pending Prescriptions Disp Refills   methocarbamol (ROBAXIN) 500 MG tablet [Pharmacy Med Name: METHOCARBAMOL 500 MG TABLET] 120 tablet 2    Sig: TAKE 1 TABLET BY MOUTH EVERY 6 HOURS AS NEEDED FOR MUSCLE SPASM     Not Delegated - Analgesics:  Muscle Relaxants Failed - 01/13/2022  1:16 PM      Failed - This refill cannot be delegated      Failed - Valid encounter within last 6 months    Recent Outpatient Visits           1 year ago Type 2 diabetes mellitus with diabetic neuropathy, unspecified whether long term insulin use ([place])   [place] Pickard, Cammie Mcgee, MD   1 year ago Type 2 diabetes mellitus with diabetic neuropathy, unspecified whether long term insulin use ([place])   [place] Pickard, Cammie Mcgee, MD   1 year ago Gynecologic exam normal   [place] Noemi Chapel A, NP   1 year ago Type 2 diabetes mellitus with other circulatory complication, without long-term current use of insulin ([place])   [place] Pickard, Cammie Mcgee, MD   2 years ago Injury of left toe, initial encounter   [place], Modena Nunnery, MD               metFORMIN (GLUCOPHAGE) 1000 MG tablet [Pharmacy Med Name: METFORMIN HCL 1,000 MG TABLET] 60 tablet 2    Sig: TAKE 1 TABLET (1,000 MG TOTAL) BY MOUTH TWICE A DAY WITH FOOD     Endocrinology:  Diabetes - Biguanides Failed - 01/13/2022  1:16 PM      Failed - eGFR in normal range and within 360 days    GFR, Est African American  Date Value Ref Range Status  04/03/2020 69 > OR = 60 mL/min/1.24m Final   GFR, Est Non African American  Date Value Ref Range Status  04/03/2020 59 (L) > OR = 60 mL/min/1.778mFinal   GFR, Estimated  Date Value Ref Range Status  06/30/2021 58 (L) >60 mL/min Final    Comment:    (NOTE) Calculated using  the CKD-EPI Creatinine Equation (2021)    GFR  Date Value Ref Range Status  12/07/2017 59.28 (L) >60.00 mL/min Final   eGFR  Date Value Ref Range Status  07/30/2021 59 (L) > OR = 60 mL/min/1.7323minal    Comment:    The eGFR is based on the CKD-EPI 2021 equation. To calculate  the new eGFR from a previous Creatinine or Cystatin C result, go to https://www.kidney.org/professionals/ kdoqi/gfr%5Fcalculator          Failed - Valid encounter within last 6 months    Recent Outpatient Visits           1 year ago Type 2 diabetes mellitus with diabetic neuropathy, unspecified whether long term insulin use (HCCCraig [place], WarCammie McgeeD   1 year ago Type 2 diabetes mellitus with diabetic neuropathy, unspecified whether long term insulin use (HCCRow[place] BroWoodstockckard, WarCammie McgeeD   1 year ago Gynecologic exam normal   [place]   1 year ago Type 2 diabetes mellitus with other circulatory complication, without long-term current use of insulin ([place]CC[place]  [facility] Susy Frizzle, MD   2 years ago Injury of left toe, initial encounter   [place], Mod[place] Nunnery, MD              Passed - Cr in normal range and within 360 days    Creat  Date Value Ref Range Status  07/30/2021 1.05 0.50 - 1.05 mg/dL Final   Creatinine, Urine  Date Value Ref Range Status  02/18/2019 16 (L) 20 - 275 mg/dL Final         Passed - HBA1C is between 0 and 7.9 and within 180 days    Hgb A1c MFr Bld  Date Value Ref Range Status  07/30/2021 6.3 (H) <5.7 % of total Hgb Final    Comment:    For someone without known diabetes, a hemoglobin  A1c value between 5.7% and 6.4% is consistent with prediabetes and should be confirmed with a  follow-up test. . For someone with known diabetes, a value <7% indicates that their diabetes is well controlled. A1c targets should be  individualized based on duration of diabetes, age, comorbid conditions, and other considerations. . This assay result is consistent with an increased risk of diabetes. . Currently, no consensus exists regarding use of hemoglobin A1c for diagnosis of diabetes for children. .          Passed - B12 Level in normal range and within 720 days    Vitamin B-12  Date Value Ref Range Status  05/08/2020 313 200 - 1,100 pg/mL Final    Comment:    . Please Note: Although the reference range for vitamin B12 is [phone] pg/mL, it has been reported that between 5 and 10% of patients with values between 200 and 400 pg/mL may experience neuropsychiatric and hematologic abnormalities due to occult B12 deficiency; less than 1% of patients with values above 400 pg/mL will have symptoms. .          Passed - CBC within normal limits and completed in the last 12 months    WBC  Date Value Ref Range Status  07/30/2021 5.0 3.8 - 10.8 Thousand/uL Final   RBC  Date Value Ref Range Status  07/30/2021 4.41 3.80 - 5.10 Million/uL Final   Hemoglobin  Date Value Ref Range Status  07/30/2021 12.4 11.7 - 15.5 g/dL Final  05/03/2019 12.5 11.1 - 15.9 g/dL Final   HCT  Date Value Ref Range Status  07/30/2021 38.6 35.0 - 45.0 % Final   Hematocrit  Date Value Ref Range Status  05/03/2019 40.1 34.0 - 46.6 % Final   MCHC  Date Value Ref Range Status  07/30/2021 32.1 32.0 - 36.0 g/dL Final   [facility]  Date Value Ref Range Status  07/30/2021 28.1 27.0 - 33.0 pg Final   MCV  Date Value Ref Range Status  07/30/2021 87.5 80.0 - 100.0 fL Final  05/03/2019 91 79 - 97 fL Final   No results found for: "PLTCOUNTKUC", "LABPLAT", "POCPLA" RDW  Date Value Ref Range Status  07/30/2021 12.9 11.0 - 15.0 % Final  05/03/2019 13.3 11.7 - 15.4 % Final

## 2022-01-14 ENCOUNTER — Telehealth: Payer: Self-pay

## 2022-01-14 ENCOUNTER — Other Ambulatory Visit: Payer: Self-pay

## 2022-01-14 DIAGNOSIS — M79604 Pain in right leg: Secondary | ICD-10-CM

## 2022-01-14 DIAGNOSIS — I739 Peripheral vascular disease, unspecified: Secondary | ICD-10-CM

## 2022-01-14 DIAGNOSIS — E1159 Type 2 diabetes mellitus with other circulatory complications: Secondary | ICD-10-CM

## 2022-01-14 MED ORDER — METHOCARBAMOL 500 MG PO TABS: ORAL_TABLET | ORAL | 2 refills | Status: DC

## 2022-01-14 MED ORDER — METFORMIN HCL 1000 MG PO TABS: 1000.0000 mg | ORAL_TABLET | Freq: Two times a day (BID) | ORAL | 3 refills | Status: DC

## 2022-01-14 NOTE — Telephone Encounter (Signed)
Pt called in to check to see why these meds were not refilled:  methocarbamol (ROBAXIN) 500 MG tablet [[id number]] metFORMIN (GLUCOPHAGE) 1000 MG tablet [[id number]]  LOV: 09/30/21  CB: [phone]  [facility] [address]

## 2022-01-19 ENCOUNTER — Telehealth: Payer: Self-pay

## 2022-01-19 NOTE — Telephone Encounter (Signed)
PRIOR AUTH FOR PROTONIXKEILANA MORLOCK (KeyAundra Millet) PA Case ID #: [id number] Need Help? Call us at [phone] Outcome Approved today Your PA request has been approved. Additional information will be provided in the approval communication. (Message 1145) Authorization Expiration Date: 01/19/2023 Drug Pantoprazole Sodium 40MG  dr tablets Form [employer] Electronic PA Form (2017 [place]  Pt advised and verbalized understanding of all. Mjp,lpn

## 2022-01-26 ENCOUNTER — Other Ambulatory Visit: Payer: Self-pay | Admitting: Family Medicine

## 2022-01-26 MED ORDER — ROSUVASTATIN CALCIUM 40 MG PO TABS: ORAL_TABLET | ORAL | 1 refills | Status: DC

## 2022-01-26 NOTE — Telephone Encounter (Signed)
Prescription Request  01/26/2022  Is this a "Controlled Substance" medicine? No  LOV: 09/30/2021  What is the name of the medication or equipment? Rosuvastatin calcium 40 mg tab   Have you contacted your pharmacy to request a refill? Yes   Which pharmacy would you like this sent to?  [id number] [place], [place] - [address] [place] [address] Phone: [phone] Fax: [phone]    Patient notified that their request is being sent to the clinical staff for review and that they should receive a response within 2 business days.   Please advise at [facility] [phone]

## 2022-01-26 NOTE — Telephone Encounter (Signed)
Prescription Request  01/26/2022  Is this a "Controlled Substance" medicine? No  LOV: 09/30/2021  What is the name of the medication or equipment? Creon dr 36,000 unit capsule   Have you contacted your pharmacy to request a refill? Yes   Which pharmacy would you like this sent to?  [id number] [place], [place] - [address] AT [address] [facility] [address] [place] [address] Phone: [phone] Fax: [phone]    Patient notified that their request is being sent to the clinical staff for review and that they should receive a response within 2 business days.   Please advise at [facility] [phone]

## 2022-01-26 NOTE — Telephone Encounter (Signed)
Requested Prescriptions  Pending Prescriptions Disp Refills   rosuvastatin (CRESTOR) 40 MG tablet 90 tablet 3    Sig: TAKE 1 TABLET(40 MG) BY MOUTH EVERY NIGHT     Cardiovascular:  Antilipid - Statins 2 Failed - 01/26/2022  5:09 PM      Failed - Valid encounter within last 12 months    Recent Outpatient Visits           1 year ago Type 2 diabetes mellitus with diabetic neuropathy, unspecified whether long term insulin use (HCC)   [facility] [place], Priscille Heidelberg, MD   1 year ago Type 2 diabetes mellitus with diabetic neuropathy, unspecified whether long term insulin use (HCC)   [facility] Pickard, Priscille Heidelberg, MD   1 year ago Gynecologic exam normal   [facility] Cathlean Marseilles A, NP   1 year ago Type 2 diabetes mellitus with other circulatory complication, without long-term current use of insulin (HCC)   [facility] Pickard, Priscille Heidelberg, MD   2 years ago Injury of left toe, initial encounter   [facility] [place], Velna Hatchet, MD              Failed - Lipid Panel in normal range within the last 12 months    Cholesterol  Date Value Ref Range Status  07/30/2021 159 <200 mg/dL Final   LDL Cholesterol (Calc)  Date Value Ref Range Status  07/30/2021 75 mg/dL (calc) Final    Comment:    Reference range: <100 . Desirable range <100 mg/dL for primary prevention;   <70 mg/dL for patients with CHD or diabetic patients  with > or = 2 CHD risk factors. Marland Kitchen LDL-C is now calculated using the Martin-Hopkins  calculation, which is a validated novel method providing  better accuracy than the Friedewald equation in the  estimation of LDL-C.  Horald Pollen et al. Lenox Ahr. 7939;030(09): 2061-2068  (http://education.QuestDiagnostics.com/faq/FAQ164)    Direct LDL  Date Value Ref Range Status  01/02/2014 50.6 mg/dL Final    Comment:    Optimal:  <100 mg/dLNear or Above Optimal:  100-129 mg/dLBorderline High:  130-159  mg/dLHigh:  160-189 mg/dLVery High:  >190 mg/dL   HDL  Date Value Ref Range Status  07/30/2021 53 > OR = 50 mg/dL Final   Triglycerides  Date Value Ref Range Status  07/30/2021 224 (H) <150 mg/dL Final    Comment:    . If a non-fasting specimen was collected, consider repeat triglyceride testing on a fasting specimen if clinically indicated.  Perry Mount et al. J. of Clin. Lipidol. 2015;9:129-169. Marland Kitchen          Passed - Cr in normal range and within 360 days    Creat  Date Value Ref Range Status  07/30/2021 1.05 0.50 - 1.05 mg/dL Final   Creatinine, Urine  Date Value Ref Range Status  02/18/2019 16 (L) 20 - 275 mg/dL Final         Passed - Patient is not pregnant

## 2022-02-09 ENCOUNTER — Telehealth: Payer: Self-pay | Admitting: Family Medicine

## 2022-02-09 NOTE — Telephone Encounter (Signed)
Prescription Request  02/09/2022  Is this a "Controlled Substance" medicine? Yes  LOV: 09/30/2021  What is the name of the medication or equipment?   HYDROcodone-acetaminophen (NORCO/VICODIN) 5-325 MG tablet   Have you contacted your pharmacy to request a refill? Yes   Which pharmacy would you like this sent to?  [id number] [place], [place] - [address] [place] [address] Phone: [phone] Fax: [phone]    Patient notified that their request is being sent to the clinical staff for review and that they should receive a response within 2 business days.   Please advise at [phone].   **Patient has a couple of pills left**

## 2022-02-10 ENCOUNTER — Other Ambulatory Visit: Payer: Self-pay | Admitting: Family Medicine

## 2022-02-10 ENCOUNTER — Ambulatory Visit (INDEPENDENT_AMBULATORY_CARE_PROVIDER_SITE_OTHER): Payer: 59 | Admitting: Family Medicine

## 2022-02-10 ENCOUNTER — Encounter: Payer: Self-pay | Admitting: Family Medicine

## 2022-02-10 VITALS — BP 102/78 | HR 85 | Temp 97.7°F | Ht 63.0 in | Wt 191.0 lb

## 2022-02-10 DIAGNOSIS — B9689 Other specified bacterial agents as the cause of diseases classified elsewhere: Secondary | ICD-10-CM | POA: Diagnosis not present

## 2022-02-10 DIAGNOSIS — J019 Acute sinusitis, unspecified: Secondary | ICD-10-CM

## 2022-02-10 MED ORDER — HYDROCODONE-ACETAMINOPHEN 5-325 MG PO TABS
1.0000 | ORAL_TABLET | Freq: Four times a day (QID) | ORAL | 0 refills | Status: DC | PRN
Start: 2022-02-10 — End: 2022-03-14

## 2022-02-10 MED ORDER — AMOXICILLIN-POT CLAVULANATE 500-125 MG PO TABS
1.0000 | ORAL_TABLET | Freq: Two times a day (BID) | ORAL | 0 refills | Status: AC
Start: 2022-02-10 — End: 2022-02-15

## 2022-02-10 NOTE — Progress Notes (Signed)
Acute Office Visit  Subjective:     Patient ID: [name], female    DOB: 1955-12-20, 66 y.o.   MRN: [id number]  Chief Complaint  Patient presents with   Acute Visit    cough, congestion, and wheezing    HPI Patient is in today for 2 weeks of mucopurulent cough and rhinorrhea, congestion, wheezing, shortness of breath with coughing, "killer" headache, fever Denies sinus pressure, ear pain, sore throat Her husband has been sick, son, granddaughter. Has tried Nyquil, Tussin DM, Alka Seltzer cold and flu, and albuterol BID.  Review of Systems  All other systems reviewed and are negative.   Past Medical History:  Diagnosis Date   Aortic atherosclerosis (HCC)    Asthma    CAD (coronary artery disease)    a. LHC 2011: Diffuse distal and branch vessel disease - patient managed medically, no interventional options. b. Nuc 03/2014 - low risk, no ischemia.   Complication of anesthesia    slow to wake up with last surgery in 2011    Diabetes mellitus with circulatory complication (HCC)    Diverticulosis    Dyslipidemia    GERD (gastroesophageal reflux disease)    Headache    hx of migraines    Hypertension    Osteoarthritis    severe right knee  R TKR   PAD (peripheral artery disease) (HCC)    a. Severe stenosis mid right SFA s/p atherectomy 03/25/09. b. peripheral angiography in 12/2011 which showed only about 50% diffuse right SFA stenosis.   Pancreatitis 2003   Renal artery stenosis (HCC)    a. LHC 2011 - 40-50% left RAS.   Tobacco use disorder    quit 11/10   Past Surgical History:  Procedure Laterality Date   ABDOMINAL AORTAGRAM N/A 12/21/2011   Procedure: ABDOMINAL AORTAGRAM;  Surgeon: Iran Ouch, MD;  [facility]: [facility];  Service: Cardiovascular;  Laterality: N/A;   ABDOMINAL AORTOGRAM W/LOWER EXTREMITY Bilateral 05/08/2019   Procedure: ABDOMINAL AORTOGRAM W/LOWER EXTREMITY;  Surgeon: Iran Ouch, MD;  [facility];  Service:  Cardiovascular;  Laterality: Bilateral;   CARDIAC CATHETERIZATION     ENDARTERECTOMY FEMORAL Right 10/10/2019   Procedure: RIGHT FEMORAL ENDARTERECTOMY;  Surgeon: Cephus Shelling, MD;  Location: [facility] OR;  Service: Vascular;  Laterality: Right;   ESOPHAGOGASTRODUODENOSCOPY (EGD) WITH PROPOFOL N/A 10/05/2017   Procedure: ESOPHAGOGASTRODUODENOSCOPY (EGD) WITH PROPOFOL;  Surgeon: Rachael Fee, MD;  Location: [facility];  Service: Endoscopy;  Laterality: N/A;   EUS N/A 10/05/2017   Procedure: UPPER ENDOSCOPIC ULTRASOUND (EUS) RADIAL;  Surgeon: Rachael Fee, MD;  Location: [facility];  Service: Endoscopy;  Laterality: N/A;   FOOT SURGERY Left    LOWER EXTREMITY ANGIOGRAM Bilateral 05/27/2015   Procedure: Lower Extremity Angiogram;  Surgeon: Iran Ouch, MD;  [facility];  Service: Cardiovascular;  Laterality: Bilateral;   PATCH ANGIOPLASTY Right 10/10/2019   Procedure: PATCH ANGIOPLASTY USING Livia Snellen BIOLOGIC PATCH;  Surgeon: Cephus Shelling, MD;  Location: [facility] OR;  Service: Vascular;  Laterality: Right;   PERIPHERAL VASCULAR ATHERECTOMY Left 05/08/2019   Procedure: PERIPHERAL VASCULAR ATHERECTOMY;  Surgeon: Iran Ouch, MD;  [facility];  Service: Cardiovascular;  Laterality: Left;   PERIPHERAL VASCULAR CATHETERIZATION N/A 05/27/2015   Procedure: Abdominal Aortogram;  Surgeon: Iran Ouch, MD;  [facility];  Service: Cardiovascular;  Laterality: N/A;   PERIPHERAL VASCULAR CATHETERIZATION Right 05/27/2015   Procedure: Peripheral Vascular Intervention;  Surgeon: Wellington Hampshire, MD;  Location: [place] CV LAB;  Service: Cardiovascular;  Laterality: Right;  SFA   TOTAL KNEE ARTHROPLASTY Right 2011   TOTAL KNEE ARTHROPLASTY Left 05/22/2014   Procedure: LEFT TOTAL KNEE ARTHROPLASTY;  Surgeon: Latanya Maudlin, MD;  [facility]: [facility] ORS;  Service: Orthopedics;  Laterality: Left;   TRIGGER FINGER RELEASE Left 07/07/2021   Procedure:  LEFT INDEX FINGER RELEASE TRIGGER FINGER/A-1 PULLEY / CARPOMETACARPAL INJECTION;  Surgeon: Sherilyn Cooter, MD;  Location: [place];  Service: Orthopedics;  Laterality: Left;   TUBAL LIGATION  1980   Current Outpatient Medications on File Prior to Visit  Medication Sig Dispense Refill   acetaminophen (TYLENOL) 500 MG tablet Take 1,000 mg by mouth every 6 (six) hours as needed for moderate pain or headache.     albuterol (VENTOLIN HFA) 108 (90 Base) MCG/ACT inhaler INHALE 2 PUFFS INTO THE LUNGS EVERY 6 HOURS AS NEEDED FOR WHEEZING OR SHORTNESS OF BREATH 18 g 3   Biotin w/ Vitamins C & E (HAIR/SKIN/NAILS PO) Take 1 tablet by mouth daily.     bisoprolol (ZEBETA) 5 MG tablet TAKE 1/2 TABLET(2.5 MG) BY MOUTH DAILY 45 tablet 3   clopidogrel (PLAVIX) 75 MG tablet TAKE 1 TABLET(75 MG) BY MOUTH DAILY 90 tablet 3   diclofenac sodium (VOLTAREN) 1 % GEL Apply 2 g topically 4 (four) times daily. (Patient taking differently: Apply 2 g topically 4 (four) times daily as needed (for hand pain).) 100 g 0   fluticasone-salmeterol (ADVAIR DISKUS) 250-50 MCG/ACT AEPB Inhale 1 puff into the lungs in the morning and at bedtime. 60 each 11   glucose blood (CONTOUR NEXT TEST) test strip 1 each by Other route 3 (three) times daily. Use as instructed 100 each 12   isosorbide mononitrate (IMDUR) 60 MG 24 hr tablet TAKE 1 TABLET(60 MG) BY MOUTH DAILY 90 tablet 2   lipase/protease/amylase (CREON) 36000 UNITS CPEP capsule Take 2 capsules (72,000 Units total) by mouth 3 (three) times daily with meals. May also take 1 capsule (36,000 Units total) as needed (with snacks). (Patient taking differently: Take 3 capsules (108,000 Units total) by mouth 3 (three) times daily with meals. May also take 1 capsule (36,000 Units total) as needed (with snacks).) 240 capsule 11   Loratadine (CLARITIN PO) Take 1 tablet by mouth daily.     metFORMIN (GLUCOPHAGE) 1000 MG tablet Take 1 tablet (1,000 mg total) by mouth 2 (two) times  daily with a meal. 180 tablet 3   methocarbamol (ROBAXIN) 500 MG tablet TAKE 1 TABLET BY MOUTH EVERY 6 HOURS AS NEEDED FOR MUSCLE SPASM 120 tablet 2   Multiple Vitamin (MULTIVITAMIN) tablet Take 1 tablet by mouth daily.     nitroGLYCERIN (NITROSTAT) 0.4 MG SL tablet Place 1 tablet under the tongue every 5 (five) minutes as needed for chest pain.     ondansetron (ZOFRAN) 4 MG tablet Take 1 tablet (4 mg total) by mouth every 8 (eight) hours as needed for nausea or vomiting. 21 tablet 1   pantoprazole (PROTONIX) 40 MG tablet Take 1 tablet (40 mg total) by mouth daily. 90 tablet 3   pioglitazone (ACTOS) 30 MG tablet TAKE 1 TABLET(30 MG) BY MOUTH DAILY 90 tablet 3   pregabalin (LYRICA) 50 MG capsule Take 1 capsule (50 mg total) by mouth 3 (three) times daily. 90 capsule 3   Probiotic Product (TRUBIOTICS PO) Take 1 capsule by mouth daily.     promethazine (PHENERGAN) 25 MG tablet TAKE 1 TABLET(25  MG) BY MOUTH EVERY 8 HOURS AS NEEDED FOR NAUSEA OR VOMITING 30 tablet 0   rosuvastatin (CRESTOR) 40 MG tablet TAKE 1 TABLET(40 MG) BY MOUTH EVERY NIGHT 90 tablet 1   sitaGLIPtin (JANUVIA) 100 MG tablet TAKE 1 TABLET(100 MG) BY MOUTH DAILY 90 tablet 1   vitamin B-12 (CYANOCOBALAMIN) 1000 MCG tablet Take 1,000 mcg by mouth daily.     Current Facility-Administered Medications on File Prior to Visit  Medication Dose Route Frequency Provider Last Rate Last Admin   triamcinolone acetonide (KENALOG) 10 MG/ML injection 10 mg  10 mg Other Once Regal, Norman S, DPM       Allergies  Allergen Reactions   Gabapentin Other (See Comments)    Brain shakes   Glucotrol [Glipizide] Rash and Other (See Comments)    Red rash   Influenza Vaccines Other (See Comments)    Significant arm soreness requiring 1 year of physical therapy   Latex Rash   Nortriptyline Other (See Comments)    BRAIN SHAKE   Tetanus Toxoid Hives, Swelling, Rash and Other (See Comments)    Swelling to site of injection with rash and fever   Other  Itching, Other (See Comments) and Cough    Patient is highly allergic to Cats and she begins to break out in rash and turns red if someone caring for her has cats at home.  Patient takes Claritin for this type of reaction.  Patient stated that if it goes untreated then he begins to have difficulty breathing   Prednisone & Diphenhydramine     Pt get really headaches   Invokana [Canagliflozin] Other (See Comments)    Yeast Infections       Objective:    BP 102/78   Pulse 85   Temp 97.7 F (36.5 C) (Oral)   Ht 5\' 3"  (1.6 m)   Wt 191 lb (86.6 kg)   SpO2 93%   BMI 33.83 kg/m    Physical Exam Vitals and nursing note reviewed.  Constitutional:      Appearance: Normal appearance. She is normal weight.  HENT:     Head: Normocephalic and atraumatic.  Cardiovascular:     Rate and Rhythm: Normal rate and regular rhythm.     Pulses: Normal pulses.     Heart sounds: Normal heart sounds.  Pulmonary:     Effort: Pulmonary effort is normal.     Breath sounds: Examination of the right-upper field reveals decreased breath sounds. Examination of the left-upper field reveals decreased breath sounds. Examination of the right-lower field reveals decreased breath sounds. Examination of the left-lower field reveals decreased breath sounds. Decreased breath sounds present.  Skin:    General: Skin is warm and dry.  Neurological:     General: No focal deficit present.     Mental Status: She is alert and oriented to person, place, and time. Mental status is at baseline.  Psychiatric:        Mood and Affect: Mood normal.        Behavior: Behavior normal.        Thought Content: Thought content normal.        Judgment: Judgment normal.     No results found for any visits on 02/10/22.      Assessment & Plan:   Problem List Items Addressed This Visit       Respiratory   Acute bacterial sinusitis - Primary    Patients symptoms consistent with bacterial sinusitis with greater than 10 days of  mucopurulent rhinorrhea, headache, and fever. Start Augmentin BID for 5 days, continue supportive treatment at home, rest, push fluids. May use Delsym for cough, Flonase for nasal congestion, Tylenol for fever and aches. Return to office if symptoms persist or worsen.      Relevant Medications   amoxicillin-clavulanate (AUGMENTIN) 500-125 MG tablet    Meds ordered this encounter  Medications   amoxicillin-clavulanate (AUGMENTIN) 500-125 MG tablet    Sig: Take 1 tablet by mouth 2 (two) times daily for 5 days.    Dispense:  10 tablet    Refill:  0    Order Specific Question:   Supervising Provider    Answer:   Jenna Luo T [id number]    Return if symptoms worsen or fail to improve.  Rubie Maid, FNP

## 2022-02-10 NOTE — Assessment & Plan Note (Signed)
Patients symptoms consistent with bacterial sinusitis with greater than 10 days of mucopurulent rhinorrhea, headache, and fever. Start Augmentin BID for 5 days, continue supportive treatment at home, rest, push fluids. May use Delsym for cough, Flonase for nasal congestion, Tylenol for fever and aches. Return to office if symptoms persist or worsen.

## 2022-02-15 ENCOUNTER — Telehealth: Payer: Self-pay

## 2022-02-15 NOTE — Telephone Encounter (Signed)
Sasha Santaana (Key: [id number])  Your information has been sent to [place] [employer].

## 2022-02-23 ENCOUNTER — Other Ambulatory Visit: Payer: Self-pay

## 2022-02-23 NOTE — Patient Outreach (Signed)
Aging Gracefully Program  02/23/2022  [name] 1955-08-06 [id number]   [facility] Evaluation Interviewer made contact with patient. Aging Gracefully 5 month survey completed.    [place] Management Assistant [phone]

## 2022-03-03 ENCOUNTER — Other Ambulatory Visit (HOSPITAL_COMMUNITY): Payer: Self-pay

## 2022-03-03 ENCOUNTER — Other Ambulatory Visit: Payer: Self-pay | Admitting: Family Medicine

## 2022-03-04 NOTE — Telephone Encounter (Signed)
Requested medications are due for refill today.  yes  Requested medications are on the active medications list.  yes  Last refill. 12/27/2021 #30 0 rf  Future visit scheduled.   no  Notes to clinic.  Refill not delegated    Requested Prescriptions  Pending Prescriptions Disp Refills   promethazine (PHENERGAN) 25 MG tablet [Pharmacy Med Name: PROMETHAZINE 25 MG TABLET] 30 tablet 0    Sig: TAKE 1 TABLET(25 MG) BY MOUTH EVERY 8 HOURS AS NEEDED FOR NAUSEA OR VOMITING     Not Delegated - Gastroenterology: Antiemetics Failed - 03/03/2022  5:47 PM      Failed - This refill cannot be delegated      Failed - Valid encounter within last 6 months    Recent Outpatient Visits           1 year ago Type 2 diabetes mellitus with diabetic neuropathy, unspecified whether long term insulin use ([place])   [place] Pickard, Cammie Mcgee, MD   1 year ago Type 2 diabetes mellitus with diabetic neuropathy, unspecified whether long term insulin use ([place])   [place] Pickard, Cammie Mcgee, MD   1 year ago Gynecologic exam normal   [place] Eulogio Bear, NP   1 year ago Type 2 diabetes mellitus with other circulatory complication, without long-term current use of insulin ([place])   [place] Pickard, Cammie Mcgee, MD   2 years ago Injury of left toe, initial encounter   [place], Modena Nunnery, MD

## 2022-03-14 ENCOUNTER — Telehealth: Payer: Self-pay

## 2022-03-14 ENCOUNTER — Other Ambulatory Visit: Payer: Self-pay | Admitting: Family Medicine

## 2022-03-14 DIAGNOSIS — M79604 Pain in right leg: Secondary | ICD-10-CM

## 2022-03-14 DIAGNOSIS — I739 Peripheral vascular disease, unspecified: Secondary | ICD-10-CM

## 2022-03-14 MED ORDER — HYDROCODONE-ACETAMINOPHEN 5-325 MG PO TABS: 1.0000 | ORAL_TABLET | Freq: Four times a day (QID) | ORAL | 0 refills | Status: DC | PRN

## 2022-03-14 NOTE — Telephone Encounter (Signed)
Prescription Request  03/14/2022  Is this a "Controlled Substance" medicine? Yes  LOV: 02/10/22  What is the name of the medication or equipment? HYDROcodone-acetaminophen (NORCO/VICODIN) 5-325  Have you contacted your pharmacy to request a refill? No   Which pharmacy would you like this sent to?  [id number] [address], [address] [place] [address] Phone: [phone] Fax: [phone]    Patient notified that their request is being sent to the clinical staff for review and that they should receive a response within 2 business days.   Please advise at [facility] [phone]

## 2022-03-15 NOTE — Telephone Encounter (Signed)
Requested medication (s) are due for refill today: Yes  Requested medication (s) are on the active medication list: Yes  Last refill:  01/14/22  Future visit scheduled: No  Notes to clinic:  Unable to refill per protocol, cannot delegate.      Requested Prescriptions  Pending Prescriptions Disp Refills   methocarbamol (ROBAXIN) 500 MG tablet [Pharmacy Med Name: METHOCARBAMOL 500 MG TABLET] 120 tablet 2    Sig: TAKE 1 TABLET BY MOUTH EVERY 6 HOURS AS NEEDED FOR MUSCLE SPASM     Not Delegated - Analgesics:  Muscle Relaxants Failed - 03/14/2022  5:59 PM      Failed - This refill cannot be delegated      Failed - Valid encounter within last 6 months    Recent Outpatient Visits           1 year ago Type 2 diabetes mellitus with diabetic neuropathy, unspecified whether long term insulin use ([place])   [place] Pickard, Cammie Mcgee, MD   1 year ago Type 2 diabetes mellitus with diabetic neuropathy, unspecified whether long term insulin use ([place])   [place] Pickard, Cammie Mcgee, MD   1 year ago Gynecologic exam normal   [place] Eulogio Bear, NP   1 year ago Type 2 diabetes mellitus with other circulatory complication, without long-term current use of insulin ([place])   [place] Pickard, Cammie Mcgee, MD   2 years ago Injury of left toe, initial encounter   [place], Modena Nunnery, MD

## 2022-03-16 ENCOUNTER — Other Ambulatory Visit: Payer: Self-pay | Admitting: Family Medicine

## 2022-03-16 MED ORDER — SITAGLIPTIN PHOSPHATE 100 MG PO TABS: ORAL_TABLET | ORAL | 0 refills | Status: DC

## 2022-03-16 NOTE — Telephone Encounter (Signed)
Prescription Request  03/16/2022  Is this a "Controlled Substance" medicine? No  LOV: 09/30/2021  What is the name of the medication or equipment? sitaGLIPtin (JANUVIA) 100 MG tablet    Have you contacted your pharmacy to request a refill? Yes   Which pharmacy would you like this sent to?  [id number] [address], [address] [place] [address] Phone: [phone] Fax: [phone]    Patient notified that their request is being sent to the clinical staff for review and that they should receive a response within 2 business days.   Please advise at [facility] [phone]

## 2022-03-16 NOTE — Telephone Encounter (Signed)
Requested Prescriptions  Pending Prescriptions Disp Refills   sitaGLIPtin (JANUVIA) 100 MG tablet 90 tablet 0    Sig: TAKE 1 TABLET(100 MG) BY MOUTH DAILY     Endocrinology:  Diabetes - DPP-4 Inhibitors Failed - 03/16/2022 12:01 PM      Failed - HBA1C is between 0 and 7.9 and within 180 days    Hgb A1c MFr Bld  Date Value Ref Range Status  07/30/2021 6.3 (H) <5.7 % of total Hgb Final    Comment:    For someone without known diabetes, a hemoglobin  A1c value between 5.7% and 6.4% is consistent with prediabetes and should be confirmed with a  follow-up test. . For someone with known diabetes, a value <7% indicates that their diabetes is well controlled. A1c targets should be individualized based on duration of diabetes, age, comorbid conditions, and other considerations. . This assay result is consistent with an increased risk of diabetes. . Currently, no consensus exists regarding use of hemoglobin A1c for diagnosis of diabetes for children. .          Failed - Valid encounter within last 6 months    Recent Outpatient Visits           1 year ago Type 2 diabetes mellitus with diabetic neuropathy, unspecified whether long term insulin use ([place])   [place] Pickard, Cammie Mcgee, MD   1 year ago Type 2 diabetes mellitus with diabetic neuropathy, unspecified whether long term insulin use ([place])   [place] Pickard, Cammie Mcgee, MD   1 year ago Gynecologic exam normal   [place] Eulogio Bear, NP   1 year ago Type 2 diabetes mellitus with other circulatory complication, without long-term current use of insulin ([place])   [place] Pickard, Cammie Mcgee, MD   2 years ago Injury of left toe, initial encounter   [place], Modena Nunnery, MD              Passed - Cr in normal range and within 360 days    Creat  Date Value Ref Range Status  07/30/2021 1.05 0.50 - 1.05 mg/dL Final    Creatinine, Urine  Date Value Ref Range Status  02/18/2019 16 (L) 20 - 275 mg/dL Final

## 2022-04-11 ENCOUNTER — Telehealth: Payer: Self-pay | Admitting: Family Medicine

## 2022-04-11 ENCOUNTER — Other Ambulatory Visit (HOSPITAL_COMMUNITY): Payer: Self-pay

## 2022-04-11 NOTE — Telephone Encounter (Signed)
Prescription Request  04/11/2022  Is this a "Controlled Substance" medicine? Yes  LOV: 09/30/2021  What is the name of the medication or equipment?   HYDROcodone-acetaminophen (NORCO/VICODIN) 5-325 MG tablet   Have you contacted your pharmacy to request a refill? Yes   Which pharmacy would you like this sent to?  [id number] [place]Phone: [phone]: [phone]   Patient notified that their request is being sent to the clinical staff for review and that they should receive a response within 2 business days.   Please advise pharmacist at [phone]

## 2022-04-12 ENCOUNTER — Other Ambulatory Visit: Payer: Self-pay | Admitting: Family Medicine

## 2022-04-12 MED ORDER — HYDROCODONE-ACETAMINOPHEN 5-325 MG PO TABS
1.0000 | ORAL_TABLET | Freq: Four times a day (QID) | ORAL | 0 refills | Status: DC | PRN
Start: 2022-04-12 — End: 2022-05-16

## 2022-04-15 ENCOUNTER — Other Ambulatory Visit: Payer: Self-pay

## 2022-04-15 ENCOUNTER — Telehealth: Payer: Self-pay | Admitting: Family Medicine

## 2022-04-15 ENCOUNTER — Other Ambulatory Visit: Payer: Self-pay | Admitting: Family Medicine

## 2022-04-15 DIAGNOSIS — E1159 Type 2 diabetes mellitus with other circulatory complications: Secondary | ICD-10-CM

## 2022-04-15 DIAGNOSIS — I739 Peripheral vascular disease, unspecified: Secondary | ICD-10-CM

## 2022-04-15 DIAGNOSIS — M79604 Pain in right leg: Secondary | ICD-10-CM

## 2022-04-15 MED ORDER — PIOGLITAZONE HCL 30 MG PO TABS: ORAL_TABLET | ORAL | 1 refills | Status: DC

## 2022-04-15 NOTE — Telephone Encounter (Signed)
Prescription Request  04/15/2022   LOV: 09/30/2021  What is the name of the medication or equipment? pioglitazone (ACTOS) 30 MG table   Have you contacted your pharmacy to request a refill? Yes   Which pharmacy would you like this sent to?  [id number] [place]Phone: [phone]: [phone]   Patient notified that their request is being sent to the clinical staff for review and that they should receive a response within 2 business days.   [phone]

## 2022-04-15 NOTE — Telephone Encounter (Signed)
Prescription Request  04/15/2022   LOV: 09/30/2021  What is the name of the medication or equipment?   methocarbamol (ROBAXIN) 500 MG tablet   Have you contacted your pharmacy to request a refill? Yes   Which pharmacy would you like this sent to?  [id number] [place] [place]Phone: [phone]: [phone]   Patient notified that their request is being sent to the clinical staff for review and that they should receive a response within 2 business days.   Please advise pharmacist at [phone]

## 2022-04-18 ENCOUNTER — Other Ambulatory Visit: Payer: Self-pay | Admitting: Family Medicine

## 2022-04-18 ENCOUNTER — Telehealth: Payer: Self-pay

## 2022-04-18 NOTE — Telephone Encounter (Signed)
Requested medication (s) are due for refill today: yes  Requested medication (s) are on the active medication list: yes  Last refill:  last RF 03/15/22 120  Future visit scheduled: no  Notes to clinic:  called pt to schedule appt for CPE- LM on VM to call back to schedule   Requested Prescriptions  Pending Prescriptions Disp Refills   methocarbamol (ROBAXIN) 500 MG tablet 120 tablet 0     Not Delegated - Analgesics:  Muscle Relaxants Failed - 04/15/2022  3:47 PM      Failed - This refill cannot be delegated      Failed - Valid encounter within last 6 months    Recent Outpatient Visits           1 year ago Type 2 diabetes mellitus with diabetic neuropathy, unspecified whether long term insulin use ([place])   [place] Susy Frizzle, MD   1 year ago Type 2 diabetes mellitus with diabetic neuropathy, unspecified whether long term insulin use ([place])   [place] Pickard, Cammie Mcgee, MD   2 years ago Gynecologic exam normal   [place] Noemi Chapel A, NP   2 years ago Type 2 diabetes mellitus with other circulatory complication, without long-term current use of insulin ([place])   [place] Pickard, Cammie Mcgee, MD   2 years ago Injury of left toe, initial encounter   [place], Modena Nunnery, MD

## 2022-04-18 NOTE — Telephone Encounter (Signed)
Patient called to follow up on refill request; stated the pharmacist told her the insurance only approves 1 week of medication at a time. Pharmacist also told her they're unable to supply an additional amount because the prescription is worded wrong.  Patient is completely out (took her last dose yesterday).  Patient no longer has [employer] (terminated end of December) ; she now has [employer] as of 03/17/22, ID # [id number], Health plan [phone]  Please advise patient at  [phone].

## 2022-04-18 NOTE — Telephone Encounter (Signed)
PA FOR HYDROCODONE:  [name] (Key: [id number])Need help? Call us at [phone] Outcome Electronic Prior Authorization not supported. Submit via other methods Next Steps The plan will fax you a determination, typically within 1 to 5 business days.  How do I follow up? Drug HYDROcodone-Acetaminophen 5-'325MG'$  tablets Form [employer] Medicare Coverage Determination Form Prior Authorization for [place] [employer] Medicare Members. [phone] [phone]

## 2022-04-20 ENCOUNTER — Telehealth: Payer: Self-pay

## 2022-04-20 NOTE — Telephone Encounter (Signed)
Received a fax from [employer] that pt's Hydrocodone-Acetaminophen 5/325 mg has been approved from 04/18/2022-02/14/2023. Request ID [id number]. Pt advised. Mjp,lpn

## 2022-04-21 NOTE — Telephone Encounter (Signed)
CPE scheduled for 06/03/22. Patient will call back if she runs out of medication beforehand for a courtesy refill.

## 2022-04-25 MED ORDER — METHOCARBAMOL 500 MG PO TABS: ORAL_TABLET | ORAL | 0 refills | Status: DC

## 2022-04-26 ENCOUNTER — Other Ambulatory Visit: Payer: Self-pay | Admitting: Family Medicine

## 2022-04-28 ENCOUNTER — Other Ambulatory Visit: Payer: Self-pay | Admitting: Family Medicine

## 2022-04-28 DIAGNOSIS — I739 Peripheral vascular disease, unspecified: Secondary | ICD-10-CM

## 2022-04-28 DIAGNOSIS — M1710 Unilateral primary osteoarthritis, unspecified knee: Secondary | ICD-10-CM

## 2022-04-28 DIAGNOSIS — M79604 Pain in right leg: Secondary | ICD-10-CM

## 2022-04-28 NOTE — Telephone Encounter (Signed)
Prescription Request  04/28/2022  LOV: 09/30/2021  What is the name of the medication or equipment? pregabalin (LYRICA) 50 MG capsule   Have you contacted your pharmacy to request a refill? Yes   Which pharmacy would you like this sent to?  [id number] [place]Phone: [phone]: [phone]   Patient notified that their request is being sent to the clinical staff for review and that they should receive a response within 2 business days.   Please advise at [phone]

## 2022-04-28 NOTE — Telephone Encounter (Signed)
Requested medication (s) are due for refill today:   Provider to review  Requested medication (s) are on the active medication list:   Yes  Future visit scheduled:   Yes 06/03/2022   LOV 09/30/2021   Last ordered: 12/27/2021  Returned because it's a non delegated refill  Requested Prescriptions  Pending Prescriptions Disp Refills   pregabalin (LYRICA) 50 MG capsule 90 capsule 3    Sig: Take 1 capsule (50 mg total) by mouth 3 (three) times daily.     Not Delegated - Neurology:  Anticonvulsants - Controlled - pregabalin Failed - 04/28/2022 10:25 AM      Failed - This refill cannot be delegated      Failed - Valid encounter within last 12 months    Recent Outpatient Visits           1 year ago Type 2 diabetes mellitus with diabetic neuropathy, unspecified whether long term insulin use ([place])   [place] Pickard, Cammie Mcgee, MD   1 year ago Type 2 diabetes mellitus with diabetic neuropathy, unspecified whether long term insulin use ([place])   [place] Pickard, Cammie Mcgee, MD   2 years ago Gynecologic exam normal   [place] Noemi Chapel A, NP   2 years ago Type 2 diabetes mellitus with other circulatory complication, without long-term current use of insulin ([place])   [place] Pickard, Cammie Mcgee, MD   2 years ago Injury of left toe, initial encounter   [place], Modena Nunnery, MD       Future Appointments             In 1 month Pickard, Cammie Mcgee, MD [place], [facility]            Passed - Cr in normal range and within 360 days    Creat  Date Value Ref Range Status  07/30/2021 1.05 0.50 - 1.05 mg/dL Final   Creatinine, Urine  Date Value Ref Range Status  02/18/2019 16 (L) 20 - 275 mg/dL Final         Passed - Completed PHQ-2 or PHQ-9 in the last 360 days

## 2022-04-29 ENCOUNTER — Other Ambulatory Visit: Payer: Self-pay | Admitting: Family Medicine

## 2022-04-29 DIAGNOSIS — I739 Peripheral vascular disease, unspecified: Secondary | ICD-10-CM

## 2022-04-29 DIAGNOSIS — M79604 Pain in right leg: Secondary | ICD-10-CM

## 2022-04-29 DIAGNOSIS — M1710 Unilateral primary osteoarthritis, unspecified knee: Secondary | ICD-10-CM

## 2022-04-29 MED ORDER — PREGABALIN 50 MG PO CAPS: 50.0000 mg | ORAL_CAPSULE | Freq: Three times a day (TID) | ORAL | 3 refills | Status: DC

## 2022-05-05 ENCOUNTER — Ambulatory Visit (INDEPENDENT_AMBULATORY_CARE_PROVIDER_SITE_OTHER): Payer: HMO

## 2022-05-05 ENCOUNTER — Encounter: Payer: Self-pay | Admitting: Podiatry

## 2022-05-05 ENCOUNTER — Ambulatory Visit (INDEPENDENT_AMBULATORY_CARE_PROVIDER_SITE_OTHER): Payer: HMO | Admitting: Podiatry

## 2022-05-05 DIAGNOSIS — M778 Other enthesopathies, not elsewhere classified: Secondary | ICD-10-CM

## 2022-05-05 DIAGNOSIS — M7752 Other enthesopathy of left foot: Secondary | ICD-10-CM | POA: Diagnosis not present

## 2022-05-05 DIAGNOSIS — M76822 Posterior tibial tendinitis, left leg: Secondary | ICD-10-CM

## 2022-05-05 MED ORDER — TRIAMCINOLONE ACETONIDE 10 MG/ML IJ SUSP
20.0000 mg | Freq: Once | INTRAMUSCULAR | Status: AC
  Administered 2022-05-05: 20 mg

## 2022-05-06 NOTE — Progress Notes (Signed)
Subjective:   Patient ID: [name], female   DOB: 67 y.o.   MRN: [id number]   HPI Patient presents stating she is developed a lot of pain in her left ankle and also on the inside of her left ankle.  Points to 2 different distinct areas that are bothering her.  Patient's heel is doing well and she is recovered well from that surgery of a number of years ago   ROS      Objective:  Physical Exam  Neurovascular status intact with inflammation pain of the sinus tarsi left fluid buildup around the area with patient also having pain in the posterior tibial region left with moderate collapse medial longitudinal arch     Assessment:  2 separate distinct problems with 1 being inflammatory capsulitis sinus tarsi left #2 being posterior tibial tendinitis left     Plan:  H&P reviewed both conditions and at this point organ to focus on the acute inflammation.  I did do sterile prep I injected the sinus tarsi left 3 mg Kenalog 5 mg Xylocaine and then went to posterior tib explained injections of tendons and did a careful sheath injection 3 mg dexamethasone Kenalog 5 mg Xylocaine advised on supportive shoes and reappoint to recheck  X-rays indicate moderate depression of the arch of about note any arthritis of the subtalar joint or ankle joint

## 2022-05-09 ENCOUNTER — Telehealth: Payer: Self-pay

## 2022-05-11 ENCOUNTER — Other Ambulatory Visit: Payer: Self-pay | Admitting: Podiatry

## 2022-05-11 ENCOUNTER — Other Ambulatory Visit: Payer: Self-pay | Admitting: Family Medicine

## 2022-05-11 MED ORDER — METHYLPREDNISOLONE 4 MG PO TBPK: ORAL_TABLET | ORAL | 0 refills | Status: DC

## 2022-05-11 NOTE — Telephone Encounter (Signed)
Prescription Request  05/11/2022  LOV: 09/30/2021  What is the name of the medication or equipment?   isosorbide mononitrate (IMDUR) 60 MG 24 hr tablet  **90 DAY SCRIPT REQUEST**  Have you contacted your pharmacy to request a refill? Yes   Which pharmacy would you like this sent to?  [id number] [address], [address] [address] Phone: [phone] Fax: [phone]    Patient notified that their request is being sent to the clinical staff for review and that they should receive a response within 2 business days.   Please advise pharmacist at [phone].

## 2022-05-11 NOTE — Telephone Encounter (Signed)
Sent in a steroid pack

## 2022-05-12 MED ORDER — ISOSORBIDE MONONITRATE ER 60 MG PO TB24: ORAL_TABLET | ORAL | 0 refills | Status: DC

## 2022-05-12 NOTE — Telephone Encounter (Signed)
Requested Prescriptions  Pending Prescriptions Disp Refills   isosorbide mononitrate (IMDUR) 60 MG 24 hr tablet 90 tablet 0    Sig: TAKE 1 TABLET(60 MG) BY MOUTH DAILY     Cardiovascular:  Nitrates Failed - 05/11/2022  4:00 PM      Failed - Valid encounter within last 12 months    Recent Outpatient Visits           1 year ago Type 2 diabetes mellitus with diabetic neuropathy, unspecified whether long term insulin use ([place])   [place] Pickard, Cammie Mcgee, MD   2 years ago Type 2 diabetes mellitus with diabetic neuropathy, unspecified whether long term insulin use ([place])   [place] Pickard, Cammie Mcgee, MD   2 years ago Gynecologic exam normal   [place] Noemi Chapel A, NP   2 years ago Type 2 diabetes mellitus with other circulatory complication, without long-term current use of insulin ([place])   [place] Pickard, Cammie Mcgee, MD   2 years ago Injury of left toe, initial encounter   [place], Modena Nunnery, MD       Future Appointments             In 3 weeks Pickard, Cammie Mcgee, MD [place], [facility]            Passed - Last BP in normal range    BP Readings from Last 1 Encounters:  02/10/22 102/78         Passed - Last Heart Rate in normal range    Pulse Readings from Last 1 Encounters:  02/10/22 85

## 2022-05-16 ENCOUNTER — Other Ambulatory Visit: Payer: Self-pay | Admitting: Family Medicine

## 2022-05-16 ENCOUNTER — Telehealth: Payer: Self-pay | Admitting: Family Medicine

## 2022-05-16 MED ORDER — HYDROCODONE-ACETAMINOPHEN 5-325 MG PO TABS: 1.0000 | ORAL_TABLET | Freq: Four times a day (QID) | ORAL | 0 refills | Status: DC | PRN

## 2022-05-16 NOTE — Telephone Encounter (Signed)
Prescription Request  05/16/2022  LOV: 09/30/2021  What is the name of the medication or equipment?  HYDROcodone-acetaminophen (NORCO/VICODIN) 5-325 MG tablet   Have you contacted your pharmacy to request a refill? Yes   Which pharmacy would you like this sent to?  [id number] [address], [address] [place] [address] Phone: [phone] Fax: [phone]    Patient notified that their request is being sent to the clinical staff for review and that they should receive a response within 2 business days.   Please advise patient when refill sent in at [phone].

## 2022-05-20 ENCOUNTER — Other Ambulatory Visit: Payer: Self-pay | Admitting: Family Medicine

## 2022-05-20 DIAGNOSIS — M79604 Pain in right leg: Secondary | ICD-10-CM

## 2022-05-20 DIAGNOSIS — I739 Peripheral vascular disease, unspecified: Secondary | ICD-10-CM

## 2022-05-23 NOTE — Telephone Encounter (Signed)
Requested medication (s) are due for refill today: yes  Requested medication (s) are on the active medication list: yes  Last refill:  04/25/22  Future visit scheduled: yes  Notes to clinic:  Unable to refill per protocol, cannot delegate.      Requested Prescriptions  Pending Prescriptions Disp Refills   methocarbamol (ROBAXIN) 500 MG tablet [Pharmacy Med Name: METHOCARBAMOL 500 MG TABLET] 120 tablet 0    Sig: TAKE 1 TABLET BY MOUTH EVERY 6 HOURS AS NEEDED FOR MUSCLE SPASM     Not Delegated - Analgesics:  Muscle Relaxants Failed - 05/20/2022  6:35 PM      Failed - This refill cannot be delegated      Failed - Valid encounter within last 6 months    Recent Outpatient Visits           1 year ago Type 2 diabetes mellitus with diabetic neuropathy, unspecified whether long term insulin use (HCC)   [facility] Pickard, Priscille Heidelberg, MD   2 years ago Type 2 diabetes mellitus with diabetic neuropathy, unspecified whether long term insulin use (HCC)   [facility] Pickard, Priscille Heidelberg, MD   2 years ago Gynecologic exam normal   [facility] Cathlean Marseilles A, NP   2 years ago Type 2 diabetes mellitus with other circulatory complication, without long-term current use of insulin (HCC)   [facility] Pickard, Priscille Heidelberg, MD   2 years ago Injury of left toe, initial encounter   [facility] [place], Velna Hatchet, MD       Future Appointments             In 1 week Pickard, Priscille Heidelberg, MD [facility]

## 2022-05-31 ENCOUNTER — Other Ambulatory Visit: Payer: Self-pay | Admitting: Family Medicine

## 2022-06-03 ENCOUNTER — Ambulatory Visit (INDEPENDENT_AMBULATORY_CARE_PROVIDER_SITE_OTHER): Payer: HMO | Admitting: Family Medicine

## 2022-06-03 VITALS — BP 128/86 | HR 83 | Temp 98.6°F | Ht 63.0 in | Wt 200.0 lb

## 2022-06-03 DIAGNOSIS — Z23 Encounter for immunization: Secondary | ICD-10-CM

## 2022-06-03 DIAGNOSIS — Z0001 Encounter for general adult medical examination with abnormal findings: Secondary | ICD-10-CM

## 2022-06-03 DIAGNOSIS — E1159 Type 2 diabetes mellitus with other circulatory complications: Secondary | ICD-10-CM | POA: Diagnosis not present

## 2022-06-03 DIAGNOSIS — Z1211 Encounter for screening for malignant neoplasm of colon: Secondary | ICD-10-CM

## 2022-06-03 DIAGNOSIS — I739 Peripheral vascular disease, unspecified: Secondary | ICD-10-CM | POA: Diagnosis not present

## 2022-06-03 DIAGNOSIS — Z780 Asymptomatic menopausal state: Secondary | ICD-10-CM | POA: Diagnosis not present

## 2022-06-03 DIAGNOSIS — I2510 Atherosclerotic heart disease of native coronary artery without angina pectoris: Secondary | ICD-10-CM

## 2022-06-03 DIAGNOSIS — Z0000 Encounter for general adult medical examination without abnormal findings: Secondary | ICD-10-CM

## 2022-06-03 NOTE — Addendum Note (Signed)
Addended by: Venia Carbon K on: 06/03/2022 12:02 PM   Modules accepted: Orders

## 2022-06-03 NOTE — Progress Notes (Signed)
Subjective:    Patient ID: [name], female    DOB: 10/04/1955, 67 y.o.   MRN: [id number]   Patient presents today for complete physical exam.  She is due for Prevnar 20.  She is also due for Shingrix.  She is due for mammogram but she prefers to schedule this herself.  Her last Cologuard was in 2019.  She refuses a colonoscopy but she will consent to Cologuard.  Her last Pap smear was in 2022 and is up-to-date.  She is due for a bone density test.  She is long overdue for fasting lab work to look at her diabetes. Past Medical History:  Diagnosis Date   Aortic atherosclerosis (HCC)    Asthma    CAD (coronary artery disease)    a. LHC 2011: Diffuse distal and branch vessel disease - patient managed medically, no interventional options. b. Nuc 03/2014 - low risk, no ischemia.   Complication of anesthesia    slow to wake up with last surgery in 2011    Diabetes mellitus with circulatory complication (HCC)    Diverticulosis    Dyslipidemia    GERD (gastroesophageal reflux disease)    Headache    hx of migraines    Hypertension    Osteoarthritis    severe right knee  R TKR   PAD (peripheral artery disease) (HCC)    a. Severe stenosis mid right SFA s/p atherectomy 03/25/09. b. peripheral angiography in 12/2011 which showed only about 50% diffuse right SFA stenosis.   Pancreatitis 2003   Renal artery stenosis (HCC)    a. LHC 2011 - 40-50% left RAS.   Tobacco use disorder    quit 11/10   Past Surgical History:  Procedure Laterality Date   ABDOMINAL AORTAGRAM N/A 12/21/2011   Procedure: ABDOMINAL AORTAGRAM;  Surgeon: Iran Ouch, MD;  Location: [facility]ATH [facility];  Service: Cardiovascular;  Laterality: N/A;   ABDOMINAL AORTOGRAM W/LOWER EXTREMITY Bilateral 05/08/2019   Procedure: ABDOMINAL AORTOGRAM W/LOWER EXTREMITY;  Surgeon: Iran Ouch, MD;  Location: [facility] INVAS[facility] CV LAB;  Service: Cardiovascular;  Laterality: Bilateral;   CARDIAC CATHETERIZATION     ENDARTERECTOMY  FEMORAL Right 10/10/2019   Procedure: RIGHT FEMORAL ENDARTERECTOMY;  Surgeon: Cephus Shelling, MD;  [facility];  Service: Vascular;  Laterality: Right;   ESOPHAGOGASTRODUODENOSCOPY (EGD) WITH PROPOFOL N/A 10/05/2017   Procedure: ESOPHAGOGASTRODUODENOSCOPY (EGD) WITH PROPOFOL;  Surgeon: Rachael Fee, MD;  Location: [facility]OS[facility];  Service: Endoscopy;  Laterality: N/A;   EUS N/A 10/05/2017   Procedure: UPPER ENDOSCOPIC ULTRASOUND (EUS) RADIAL;  Surgeon: Rachael Fee, MD;  Location: [facility];  Service: Endoscopy;  Laterality: N/A;   FOOT SURGERY Left    LOWER EXTREMITY ANGIOGRAM Bilateral 05/27/2015   Procedure: Lower Extremity Angiogram;  Surgeon: Iran Ouch, MD;  [facility];  Service: Cardiovascular;  Laterality: Bilateral;   PATCH ANGIOPLASTY Right 10/10/2019   Procedure: PATCH ANGIOPLASTY USING Livia Snellen BIOLOGIC PATCH;  Surgeon: Cephus Shelling, MD;  Location: [facility] OR;  Service: Vascular;  Laterality: Right;   PERIPHERAL VASCULAR ATHERECTOMY Left 05/08/2019   Procedure: PERIPHERAL VASCULAR ATHERECTOMY;  Surgeon: Iran Ouch, MD;  [facility];  Service: Cardiovascular;  Laterality: Left;   PERIPHERAL VASCULAR CATHETERIZATION N/A 05/27/2015   Procedure: Abdominal Aortogram;  Surgeon: Iran Ouch, MD;  [facility];  Service: Cardiovascular;  Laterality: N/A;   PERIPHERAL VASCULAR CATHETERIZATION Right 05/27/2015   Procedure: Peripheral Vascular Intervention;  Surgeon: Jerolyn Center  Argentina Donovan, MD;  Location: [facility];  Service: Cardiovascular;  Laterality: Right;  SFA   TOTAL KNEE ARTHROPLASTY Right 2011   TOTAL KNEE ARTHROPLASTY Left 05/22/2014   Procedure: LEFT TOTAL KNEE ARTHROPLASTY;  Surgeon: Ranee Gosselin, MD;  Location: [facility] ORS;  Service: Orthopedics;  Laterality: Left;   TRIGGER FINGER RELEASE Left 07/07/2021   Procedure: LEFT INDEX FINGER RELEASE TRIGGER FINGER/A-1 PULLEY / CARPOMETACARPAL INJECTION;   Surgeon: Marlyne Beards, MD;  Location: [facility];  Service: Orthopedics;  Laterality: Left;   TUBAL LIGATION  1980   Current Outpatient Medications on File Prior to Visit  Medication Sig Dispense Refill   acetaminophen (TYLENOL) 500 MG tablet Take 1,000 mg by mouth every 6 (six) hours as needed for moderate pain or headache.     albuterol (VENTOLIN HFA) 108 (90 Base) MCG/ACT inhaler INHALE 2 PUFFS INTO THE LUNGS EVERY 6 HOURS AS NEEDED FOR WHEEZING OR SHORTNESS OF BREATH 18 g 3   Biotin w/ Vitamins C & E (HAIR/SKIN/NAILS PO) Take 1 tablet by mouth daily.     bisoprolol (ZEBETA) 5 MG tablet TAKE 1/2 TABLET(2.5 MG) BY MOUTH DAILY 45 tablet 3   clopidogrel (PLAVIX) 75 MG tablet TAKE 1 TABLET(75 MG) BY MOUTH DAILY 90 tablet 3   diclofenac sodium (VOLTAREN) 1 % GEL Apply 2 g topically 4 (four) times daily. (Patient taking differently: Apply 2 g topically 4 (four) times daily as needed (for hand pain).) 100 g 0   fluticasone-salmeterol (ADVAIR DISKUS) 250-50 MCG/ACT AEPB Inhale 1 puff into the lungs in the morning and at bedtime. 60 each 11   glucose blood (CONTOUR NEXT TEST) test strip 1 each by Other route 3 (three) times daily. Use as instructed 100 each 12   HYDROcodone-acetaminophen (NORCO/VICODIN) 5-325 MG tablet Take 1 tablet by mouth every 6 (six) hours as needed for moderate pain. Chronic Pain. Dx: G89.4 Patient needs to schedule office visit 130 tablet 0   isosorbide mononitrate (IMDUR) 60 MG 24 hr tablet TAKE 1 TABLET(60 MG) BY MOUTH DAILY 90 tablet 0   lipase/protease/amylase (CREON) 36000 UNITS CPEP capsule Take 2 capsules (72,000 Units total) by mouth 3 (three) times daily with meals. May also take 1 capsule (36,000 Units total) as needed (with snacks). (Patient taking differently: Take 3 capsules (108,000 Units total) by mouth 3 (three) times daily with meals. May also take 1 capsule (36,000 Units total) as needed (with snacks).) 240 capsule 11   Loratadine (CLARITIN PO)  Take 1 tablet by mouth daily.     metFORMIN (GLUCOPHAGE) 1000 MG tablet Take 1 tablet (1,000 mg total) by mouth 2 (two) times daily with a meal. 180 tablet 3   methocarbamol (ROBAXIN) 500 MG tablet TAKE 1 TABLET BY MOUTH EVERY 6 HOURS AS NEEDED FOR MUSCLE SPASM 120 tablet 0   methylPREDNISolone (MEDROL DOSEPAK) 4 MG TBPK tablet follow package directions 21 tablet 0   Multiple Vitamin (MULTIVITAMIN) tablet Take 1 tablet by mouth daily.     nitroGLYCERIN (NITROSTAT) 0.4 MG SL tablet Place 1 tablet under the tongue every 5 (five) minutes as needed for chest pain.     pantoprazole (PROTONIX) 40 MG tablet Take 1 tablet (40 mg total) by mouth daily. 90 tablet 3   pioglitazone (ACTOS) 30 MG tablet TAKE 1 TABLET(30 MG) BY MOUTH DAILY 90 tablet 1   pregabalin (LYRICA) 50 MG capsule Take 1 capsule (50 mg total) by mouth 3 (three) times daily. 90 capsule 3   Probiotic Product (TRUBIOTICS  PO) Take 1 capsule by mouth daily.     promethazine (PHENERGAN) 25 MG tablet TAKE 1 TABLET(25 MG) BY MOUTH EVERY 8 HOURS AS NEEDED FOR NAUSEA OR VOMITING 30 tablet 0   rosuvastatin (CRESTOR) 40 MG tablet TAKE 1 TABLET(40 MG) BY MOUTH EVERY NIGHT 90 tablet 1   sitaGLIPtin (JANUVIA) 100 MG tablet TAKE 1 TABLET BY MOUTH EVERY DAY 30 tablet 2   vitamin B-12 (CYANOCOBALAMIN) 1000 MCG tablet Take 1,000 mcg by mouth daily.     Current Facility-Administered Medications on File Prior to Visit  Medication Dose Route Frequency Provider Last Rate Last Admin   triamcinolone acetonide (KENALOG) 10 MG/ML injection 10 mg  10 mg Other Once Regal, Norman S, DPM       Allergies  Allergen Reactions   Gabapentin Other (See Comments)    Brain shakes   Glucotrol [Glipizide] Rash and Other (See Comments)    Red rash   Influenza Vaccines Other (See Comments)    Significant arm soreness requiring 1 year of physical therapy   Latex Rash   Nortriptyline Other (See Comments)    BRAIN SHAKE   Tetanus Toxoid Hives, Swelling, Rash and Other (See  Comments)    Swelling to site of injection with rash and fever   Other Itching, Other (See Comments) and Cough    Patient is highly allergic to Cats and she begins to break out in rash and turns red if someone caring for her has cats at home.  Patient takes Claritin for this type of reaction.  Patient stated that if it goes untreated then he begins to have difficulty breathing   Prednisone & Diphenhydramine     Pt get really headaches   Invokana [Canagliflozin] Other (See Comments)    Yeast Infections   Social History   Socioeconomic History   Marital status: Married    Spouse name: Not on file   Number of children: Not on file   Years of education: 10   Highest education level: Not on file  Occupational History   Not on file  Tobacco Use   Smoking status: Former    Types: Cigarettes    Quit date: 12/13/2008    Years since quitting: 13.4   Smokeless tobacco: Never  Vaping Use   Vaping Use: Never used  Substance and Sexual Activity   Alcohol use: No    Comment: Quit drinking around year 2000   Drug use: No    Comment: Patient quit smoking around 2011   Sexual activity: Yes    Partners: Male    Birth control/protection: Post-menopausal  Other Topics Concern   Not on file  Social History Narrative   Denies any IV drug use or marijuana use.  Former smoker for 30 years, quit Nov. 2010. She is married with 2 children. Disabled from knee, not working.   Social Determinants of Health   Financial Resource Strain: Medium Risk (09/28/2018)   Overall Financial Resource Strain (CARDIA)    Difficulty of Paying Living Expenses: Somewhat hard  Food Insecurity: Not on file  Transportation Needs: No Transportation Needs (09/28/2018)   PRAPARE - Administrator, Civil Service (Medical): No    Lack of Transportation (Non-Medical): No  Physical Activity: Not on file  Stress: Not on file  Social Connections: Unknown (09/28/2018)   Social Connection and Isolation Panel [NHANES]     Frequency of Communication with Friends and Family: Once a week    Frequency of Social Gatherings with Friends and  Family: Once a week    Attends Religious Services: Not on file    Active Member of Clubs or Organizations: Not on file    Attends Club or Organization Meetings: Not on file    Marital Status: Married  Intimate Partner Violence: Not At Risk (09/28/2018)   Humiliation, Afraid, Rape, and Kick questionnaire    Fear of Current or Ex-Partner: No    Emotionally Abused: No    Physically Abused: No    Sexually Abused: No   Family History  Problem Relation Age of Onset   CVA Mother    Emphysema Father    Breast cancer Sister 97   Colon cancer Neg Hx      Review of Systems  All other systems reviewed and are negative.      Objective:   Physical Exam Vitals reviewed.  Constitutional:      General: She is not in acute distress.    Appearance: She is well-developed. She is not diaphoretic.  Eyes:     Conjunctiva/sclera: Conjunctivae normal.  Neck:     Vascular: No carotid bruit or JVD.  Cardiovascular:     Rate and Rhythm: Normal rate and regular rhythm.     Heart sounds: Normal heart sounds. No murmur heard. Pulmonary:     Effort: Pulmonary effort is normal.     Breath sounds: Normal breath sounds. No wheezing or rales.  Chest:     Chest wall: No tenderness.  Abdominal:     General: Bowel sounds are normal. There is no distension.     Palpations: Abdomen is soft. There is no mass.     Tenderness: There is no abdominal tenderness. There is no guarding or rebound.  Musculoskeletal:     Cervical back: Neck supple.     Right lower leg: No edema.     Left lower leg: No edema.  Skin:    Findings: No erythema or rash.  Neurological:     Mental Status: She is alert and oriented to person, place, and time.     Cranial Nerves: No cranial nerve deficit.     Motor: No abnormal muscle tone.     Coordination: Coordination normal.     Deep Tendon Reflexes: Reflexes are  normal and symmetric.         Assessment & Plan:  Colon cancer screening - Plan: Cologuard  Postmenopausal estrogen deficiency - Plan: DG Bone Density  Type 2 diabetes mellitus with other circulatory complication, without long-term current use of insulin - Plan: Hemoglobin A1c, CBC with Differential/Platelet, Lipid panel, COMPLETE METABOLIC PANEL WITH GFR, Protein / Creatinine Ratio, Urine  PAD (peripheral artery disease)  Coronary artery disease involving native heart without angina pectoris, unspecified vessel or lesion type  General medical exam Blood pressure is good.  I will check fasting lab work.  I like to see her A1c less than 6.5 and her LDL cholesterol less than 55.  Recommended Shingrix.  Recommended Prevnar 20.  Recommended the patient schedule her mammogram.  I will order Cologuard to screen for colon cancer and I will also order a bone density test to screen for osteoporosis.  Patient also endorses significant pain in her anterior right hip.  Exam suggest underlying osteoarthritis however she can NSAIDs due to her history of coronary artery disease.  Therefore I recommended that she contact her orthopedist for evaluation because she may benefit from a cortisone injection in the hip.

## 2022-06-04 LAB — CBC WITH DIFFERENTIAL/PLATELET
Absolute Monocytes: 440 cells/uL (ref 200–950)
Basophils Absolute: 48 cells/uL (ref 0–200)
Basophils Relative: 0.9 %
Eosinophils Absolute: 329 cells/uL (ref 15–500)
Eosinophils Relative: 6.2 %
HCT: 35.9 % (ref 35.0–45.0)
Hemoglobin: 11.6 g/dL — ABNORMAL LOW (ref 11.7–15.5)
Lymphs Abs: 1267 cells/uL (ref 850–3900)
MCH: 27.2 pg (ref 27.0–33.0)
MCHC: 32.3 g/dL (ref 32.0–36.0)
MCV: 84.1 fL (ref 80.0–100.0)
MPV: 9.5 fL (ref 7.5–12.5)
Monocytes Relative: 8.3 %
Neutro Abs: 3217 cells/uL (ref 1500–7800)
Neutrophils Relative %: 60.7 %
Platelets: 224 10*3/uL (ref 140–400)
RBC: 4.27 10*6/uL (ref 3.80–5.10)
RDW: 14.2 % (ref 11.0–15.0)
Total Lymphocyte: 23.9 %
WBC: 5.3 10*3/uL (ref 3.8–10.8)

## 2022-06-04 LAB — COMPLETE METABOLIC PANEL WITH GFR
AG Ratio: 1.8 (calc) (ref 1.0–2.5)
ALT: 17 U/L (ref 6–29)
AST: 18 U/L (ref 10–35)
Albumin: 4.2 g/dL (ref 3.6–5.1)
Alkaline phosphatase (APISO): 82 U/L (ref 37–153)
BUN/Creatinine Ratio: 19 (calc) (ref 6–22)
BUN: 20 mg/dL (ref 7–25)
CO2: 24 mmol/L (ref 20–32)
Calcium: 9.6 mg/dL (ref 8.6–10.4)
Chloride: 106 mmol/L (ref 98–110)
Creat: 1.07 mg/dL — ABNORMAL HIGH (ref 0.50–1.05)
Globulin: 2.3 g/dL (calc) (ref 1.9–3.7)
Glucose, Bld: 138 mg/dL — ABNORMAL HIGH (ref 65–99)
Potassium: 4.6 mmol/L (ref 3.5–5.3)
Sodium: 141 mmol/L (ref 135–146)
Total Bilirubin: 0.2 mg/dL (ref 0.2–1.2)
Total Protein: 6.5 g/dL (ref 6.1–8.1)
eGFR: 58 mL/min/{1.73_m2} — ABNORMAL LOW (ref >=60)

## 2022-06-04 LAB — PROTEIN / CREATININE RATIO, URINE
Creatinine, Urine: 33 mg/dL (ref 20–275)
Protein/Creat Ratio: 364 mg/g creat — ABNORMAL HIGH (ref 24–184)
Protein/Creatinine Ratio: 0.364 mg/mg creat — ABNORMAL HIGH (ref 0.024–0.184)
Total Protein, Urine: 12 mg/dL (ref 5–24)

## 2022-06-04 LAB — LIPID PANEL
Cholesterol: 157 mg/dL (ref <200)
HDL: 50 mg/dL (ref >=50)
LDL Cholesterol (Calc): 71 mg/dL (calc)
Non-HDL Cholesterol (Calc): 107 mg/dL (calc) (ref <130)
Total CHOL/HDL Ratio: 3.1 (calc) (ref <5.0)
Triglycerides: 277 mg/dL — ABNORMAL HIGH (ref <150)

## 2022-06-04 LAB — HEMOGLOBIN A1C
Hgb A1c MFr Bld: 7.6 % of total Hgb — ABNORMAL HIGH (ref <5.7)
Mean Plasma Glucose: 171 mg/dL
eAG (mmol/L): 9.5 mmol/L

## 2022-06-06 ENCOUNTER — Other Ambulatory Visit: Payer: Self-pay

## 2022-06-06 MED ORDER — DAPAGLIFLOZIN PROPANEDIOL 10 MG PO TABS: 10.0000 mg | ORAL_TABLET | Freq: Every day | ORAL | 1 refills | Status: DC

## 2022-06-07 ENCOUNTER — Other Ambulatory Visit: Payer: Self-pay | Admitting: Family Medicine

## 2022-06-07 DIAGNOSIS — I739 Peripheral vascular disease, unspecified: Secondary | ICD-10-CM

## 2022-06-07 DIAGNOSIS — M79604 Pain in right leg: Secondary | ICD-10-CM

## 2022-06-07 NOTE — Telephone Encounter (Signed)
Requested medications are due for refill today.  no  Requested medications are on the active medications list.  yes  Last refill. 05/24/2022 #120 0 rf  Future visit scheduled.   no  Notes to clinic.  Refill/refusal not delegated.    Requested Prescriptions  Pending Prescriptions Disp Refills   methocarbamol (ROBAXIN) 500 MG tablet [Pharmacy Med Name: METHOCARBAMOL 500 MG TABLET] 120 tablet 0    Sig: TAKE 1 TABLET BY MOUTH EVERY 6 HOURS AS NEEDED FOR MUSCLE SPASM     Not Delegated - Analgesics:  Muscle Relaxants Failed - 06/07/2022  6:45 AM      Failed - This refill cannot be delegated      Failed - Valid encounter within last 6 months    Recent Outpatient Visits           1 year ago Type 2 diabetes mellitus with diabetic neuropathy, unspecified whether long term insulin use (HCC)   [facility] Pickard, Priscille Heidelberg, MD   2 years ago Type 2 diabetes mellitus with diabetic neuropathy, unspecified whether long term insulin use (HCC)   [facility] Pickard, Priscille Heidelberg, MD   2 years ago Gynecologic exam normal   [facility] Cathlean Marseilles A, NP   2 years ago Type 2 diabetes mellitus with other circulatory complication, without long-term current use of insulin (HCC)   [facility] Pickard, Priscille Heidelberg, MD   2 years ago Injury of left toe, initial encounter   [facility] [place], Velna Hatchet, MD

## 2022-06-09 ENCOUNTER — Other Ambulatory Visit: Payer: Self-pay | Admitting: Family Medicine

## 2022-06-09 ENCOUNTER — Telehealth: Payer: Self-pay

## 2022-06-09 DIAGNOSIS — Z0000 Encounter for general adult medical examination without abnormal findings: Secondary | ICD-10-CM

## 2022-06-09 DIAGNOSIS — Z1211 Encounter for screening for malignant neoplasm of colon: Secondary | ICD-10-CM | POA: Diagnosis not present

## 2022-06-09 NOTE — Telephone Encounter (Signed)
Pt called in stating that she was seen by pcp who has put in an order for a breast/and bone density exam. Pt states that the next appt they had available is in Nov. Pt would like to know if she could have a referral sent to another place where they may be able to get her to be seen sooner.  Cb#: [id number]

## 2022-06-13 ENCOUNTER — Other Ambulatory Visit: Payer: Self-pay

## 2022-06-13 ENCOUNTER — Telehealth: Payer: Self-pay

## 2022-06-13 ENCOUNTER — Ambulatory Visit: Payer: HMO

## 2022-06-13 DIAGNOSIS — J4489 Other specified chronic obstructive pulmonary disease: Secondary | ICD-10-CM

## 2022-06-13 MED ORDER — ALBUTEROL SULFATE HFA 108 (90 BASE) MCG/ACT IN AERS
2.0000 | INHALATION_SPRAY | Freq: Four times a day (QID) | RESPIRATORY_TRACT | 1 refills | Status: DC | PRN
Start: 2022-06-13 — End: 2022-07-21

## 2022-06-13 NOTE — Telephone Encounter (Signed)
Prescription Request  06/13/2022  LOV: 06/03/22  What is the name of the medication or equipment? albuterol (VENTOLIN HFA) 108 (90 Base) MCG/ACT inhaler [[id number]]  Have you contacted your pharmacy to request a refill? No   Which pharmacy would you like this sent to?  [id number] [place], [place] - [address] [facility] [address] [place] [address] Phone: [phone] Fax: [phone]    Patient notified that their request is being sent to the clinical staff for review and that they should receive a response within 2 business days.   Please advise at [facility] [phone]  Prescription Request  06/13/2022  LOV:  06/03/22  What is the name of the medication or equipment? fluticasone-salmeterol (ADVAIR DISKUS) 250-50 MCG/ACT AEPB [[id number]]  Have you contacted your pharmacy to request a refill? No   Which pharmacy would you like this sent to?  [id number] [place], [place] - [address] [facility] [address] AT [facility] [address] [place] [address] Phone: [phone] Fax: [phone]    Patient notified that their request is being sent to the clinical staff for review and that they should receive a response within 2 business days.   Please advise at [facility] [phone]  Prescription Request  06/13/2022  LOV: 06/03/22  What is the name of the medication or equipment? HYDROcodone-acetaminophen (NORCO/VICODIN) 5-325 MG tablet [[id number]]  Have you contacted your pharmacy to request a refill? No   Which pharmacy would you like this sent to?  [id number] [place], [place] - [address] [facility] [address] [place] [address] Phone: [phone] Fax: [phone]    Patient notified that their request is being sent to the clinical staff for review and that they should receive a response within 2 business days.   Please advise at [facility] [phone]

## 2022-06-14 ENCOUNTER — Other Ambulatory Visit: Payer: Self-pay

## 2022-06-14 DIAGNOSIS — J4489 Other specified chronic obstructive pulmonary disease: Secondary | ICD-10-CM

## 2022-06-14 MED ORDER — FLUTICASONE-SALMETEROL 250-50 MCG/ACT IN AEPB
1.0000 | INHALATION_SPRAY | Freq: Two times a day (BID) | RESPIRATORY_TRACT | 11 refills | Status: DC
Start: 2022-06-14 — End: 2022-11-02

## 2022-06-15 ENCOUNTER — Other Ambulatory Visit: Payer: Self-pay

## 2022-06-15 DIAGNOSIS — I739 Peripheral vascular disease, unspecified: Secondary | ICD-10-CM

## 2022-06-15 DIAGNOSIS — I70209 Unspecified atherosclerosis of native arteries of extremities, unspecified extremity: Secondary | ICD-10-CM

## 2022-06-15 DIAGNOSIS — I701 Atherosclerosis of renal artery: Secondary | ICD-10-CM

## 2022-06-15 MED ORDER — CLOPIDOGREL BISULFATE 75 MG PO TABS
ORAL_TABLET | ORAL | 1 refills | Status: DC
Start: 2022-06-15 — End: 2022-10-20

## 2022-06-15 NOTE — Telephone Encounter (Signed)
Prescription Request  06/15/2022  LOV: 06/03/22  What is the name of the medication or equipment? clopidogrel (PLAVIX) 75 MG tablet [[id number]]  Have you contacted your pharmacy to request a refill? No   Which pharmacy would you like this sent to?  [id number] [place], [place] - [address] AT [facility] [address] [place] [address] Phone: [phone] Fax: [phone]    Patient notified that their request is being sent to the clinical staff for review and that they should receive a response within 2 business days.   Please advise at [facility] [phone]

## 2022-06-15 NOTE — Telephone Encounter (Signed)
Prescription Request  06/15/2022  LOV:06/03/22  What is the name of the medication or equipment? pioglitazone (ACTOS) 30 MG tablet [[id number]]   Have you contacted your pharmacy to request a refill? Yes   Which pharmacy would you like this sent to?  [id number] [place], [place] - [address] [facility] [address] AT [facility] [address] [place] [address] [place] [address] Phone: [phone] Fax: [phone]    Patient notified that their request is being sent to the clinical staff for review and that they should receive a response within 2 business days.   Please advise at [facility] [phone]

## 2022-06-16 ENCOUNTER — Telehealth: Payer: Self-pay | Admitting: Family Medicine

## 2022-06-16 NOTE — Telephone Encounter (Signed)
Prescription Request  06/16/2022  LOV: 06/03/2022  What is the name of the medication or equipment?   HYDROcodone-acetaminophen (NORCO/VICODIN) 5-325 MG tablet  **patient unsure if provider wants her script to be for 130 pills; stated Dr. Tanya Nones has to word it that way for them to fill it**  Have you contacted your pharmacy to request a refill? Yes   Which pharmacy would you like this sent to?  [id number] [place], [place] - [address] [facility] [address] AT [facility] [address] [place] [address] Phone: [phone] Fax: [phone]    Patient notified that their request is being sent to the clinical staff for review and that they should receive a response within 2 business days.   Please advise when script sent at [phone].

## 2022-06-17 ENCOUNTER — Other Ambulatory Visit: Payer: Self-pay | Admitting: Family Medicine

## 2022-06-17 ENCOUNTER — Other Ambulatory Visit: Payer: Self-pay

## 2022-06-17 LAB — COLOGUARD: COLOGUARD: NEGATIVE

## 2022-06-17 MED ORDER — HYDROCODONE-ACETAMINOPHEN 5-325 MG PO TABS: 1.0000 | ORAL_TABLET | Freq: Four times a day (QID) | ORAL | 0 refills | Status: DC | PRN

## 2022-06-17 NOTE — Telephone Encounter (Signed)
Unable to refill per protocol, Rx request is too soon  Requested Prescriptions  Pending Prescriptions Disp Refills   rosuvastatin (CRESTOR) 40 MG tablet 90 tablet 1    Sig: TAKE 1 TABLET(40 MG) BY MOUTH EVERY NIGHT     Cardiovascular:  Antilipid - Statins 2 Failed - 06/17/2022 11:07 AM      Failed - Cr in normal range and within 360 days    Creat  Date Value Ref Range Status  06/03/2022 1.07 (H) 0.50 - 1.05 mg/dL Final   Creatinine, Urine  Date Value Ref Range Status  06/03/2022 33 20 - 275 mg/dL Final         Failed - Valid encounter within last 12 months    Recent Outpatient Visits           1 year ago Type 2 diabetes mellitus with diabetic neuropathy, unspecified whether long term insulin use (HCC)   [facility] [place], Priscille Heidelberg, MD   2 years ago Type 2 diabetes mellitus with diabetic neuropathy, unspecified whether long term insulin use (HCC)   [facility] [place], Priscille Heidelberg, MD   2 years ago Gynecologic exam normal   [facility] Cathlean Marseilles A, NP   2 years ago Type 2 diabetes mellitus with other circulatory complication, without long-term current use of insulin (HCC)   [facility] [place]ard, Priscille Heidelberg, MD   2 years ago Injury of left toe, initial encounter   [facility] [place], Velna Hatchet, MD              Failed - Lipid Panel in normal range within the last 12 months    Cholesterol  Date Value Ref Range Status  06/03/2022 157 <200 mg/dL Final   LDL Cholesterol (Calc)  Date Value Ref Range Status  06/03/2022 71 mg/dL (calc) Final    Comment:    Reference range: <100 . Desirable range <100 mg/dL for primary prevention;   <70 mg/dL for patients with CHD or diabetic patients  with > or = 2 CHD risk factors. Marland Kitchen LDL-C is now calculated using the Martin-Hopkins  calculation, which is a validated novel method providing  better accuracy than the Friedewald equation in the   estimation of LDL-C.  Horald Pollen et al. Lenox Ahr. 1610;960(45): 2061-2068  (http://education.[employer].com/faq/FAQ164)    Direct LDL  Date Value Ref Range Status  01/02/2014 50.6 mg/dL Final    Comment:    Optimal:  <100 mg/dLNear or Above Optimal:  100-129 mg/dLBorderline High:  130-159 mg/dLHigh:  160-189 mg/dLVery High:  >190 mg/dL   HDL  Date Value Ref Range Status  06/03/2022 50 > OR = 50 mg/dL Final   Triglycerides  Date Value Ref Range Status  06/03/2022 277 (H) <150 mg/dL Final    Comment:    . If a non-fasting specimen was collected, consider repeat triglyceride testing on a fasting specimen if clinically indicated.  Perry Mount et al. J. of Clin. Lipidol. 2015;9:129-169. Marland Kitchen          Passed - Patient is not pregnant       bisoprolol (ZEBETA) 5 MG tablet 45 tablet 3    Sig: TAKE 1/2 TABLET(2.5 MG) BY MOUTH DAILY     Cardiovascular: Beta Blockers 2 Failed - 06/17/2022 11:07 AM      Failed - Cr in normal range and within 360 days    Creat  Date Value Ref Range Status  06/03/2022 1.07 (H) 0.50 - 1.05 mg/dL  Final   Creatinine, Urine  Date Value Ref Range Status  06/03/2022 33 20 - 275 mg/dL Final         Failed - Valid encounter within last 6 months    Recent Outpatient Visits           1 year ago Type 2 diabetes mellitus with diabetic neuropathy, unspecified whether long term insulin use (HCC)   [facility] Pickard, Priscille Heidelberg, MD   2 years ago Type 2 diabetes mellitus with diabetic neuropathy, unspecified whether long term insulin use (HCC)   [facility] Pickard, Priscille Heidelberg, MD   2 years ago Gynecologic exam normal   [facility] Cathlean Marseilles A, NP   2 years ago Type 2 diabetes mellitus with other circulatory complication, without long-term current use of insulin (HCC)   [facility] Pickard, Priscille Heidelberg, MD   2 years ago Injury of left toe, initial encounter   [facility]  [place], Velna Hatchet, MD              Passed - Last BP in normal range    BP Readings from Last 1 Encounters:  06/03/22 128/86         Passed - Last Heart Rate in normal range    Pulse Readings from Last 1 Encounters:  06/03/22 83

## 2022-06-17 NOTE — Telephone Encounter (Signed)
Prescription Request  06/17/2022  LOV: 06/03/22  What is the name of the medication or equipment? rosuvastatin (CRESTOR) 40 MG tablet [[id number]]  Have you contacted your pharmacy to request a refill? Yes   Which pharmacy would you like this sent to?  [id number] [place], [place] - [address] [facility] [address] [facility] [address] [place] [address] Phone: [phone] Fax: [phone]    Patient notified that their request is being sent to the clinical staff for review and that they should receive a response within 2 business days.   Please advise at [facility] [phone]  Prescription Request  06/17/2022  LOV: 06/03/22  What is the name of the medication or equipment? bisoprolol (ZEBETA) 5 MG tablet [[id number]]  Have you contacted your pharmacy to request a refill? Yes   Which pharmacy would you like this sent to?  [id number] [place], [place] - [address] [facility] [address] AT [facility] [address] [place] [address] Phone: [phone] Fax: [phone]    Patient notified that their request is being sent to the clinical staff for review and that they should receive a response within 2 business days.   Please advise at [facility] [phone]

## 2022-06-20 NOTE — Telephone Encounter (Signed)
Requested medication (s) are due for refill today: yes  Requested medication (s) are on the active medication list: yes  Last refill:  04/27/22  Future visit scheduled: yes  Notes to clinic:  Unable to refill per protocol, cannot delegate.      Requested Prescriptions  Pending Prescriptions Disp Refills   promethazine (PHENERGAN) 25 MG tablet [Pharmacy Med Name: PROMETHAZINE 25 MG TABLET] 30 tablet 0    Sig: TAKE 1 TABLET(25 MG) BY MOUTH EVERY 8 HOURS AS NEEDED FOR NAUSEA OR VOMITING     Not Delegated - Gastroenterology: Antiemetics Failed - 06/17/2022  7:27 PM      Failed - This refill cannot be delegated      Failed - Valid encounter within last 6 months    Recent Outpatient Visits           1 year ago Type 2 diabetes mellitus with diabetic neuropathy, unspecified whether long term insulin use (HCC)   [facility] Pickard, Priscille Heidelberg, MD   2 years ago Type 2 diabetes mellitus with diabetic neuropathy, unspecified whether long term insulin use (HCC)   [facility] Pickard, Priscille Heidelberg, MD   2 years ago Gynecologic exam normal   [facility] Cathlean Marseilles A, NP   2 years ago Type 2 diabetes mellitus with other circulatory complication, without long-term current use of insulin (HCC)   [facility] [place], Priscille Heidelberg, MD   2 years ago Injury of left toe, initial encounter   [facility] [place], Velna Hatchet, MD

## 2022-06-22 ENCOUNTER — Other Ambulatory Visit: Payer: Self-pay | Admitting: Family Medicine

## 2022-06-22 DIAGNOSIS — M79604 Pain in right leg: Secondary | ICD-10-CM

## 2022-06-22 DIAGNOSIS — I739 Peripheral vascular disease, unspecified: Secondary | ICD-10-CM

## 2022-06-24 ENCOUNTER — Ambulatory Visit
Admission: RE | Admit: 2022-06-24 | Discharge: 2022-06-24 | Disposition: A | Discharge status: Home / Self Care | Payer: HMO | Source: Ambulatory Visit | Attending: Family Medicine | Admitting: Family Medicine

## 2022-06-24 DIAGNOSIS — Z0000 Encounter for general adult medical examination without abnormal findings: Secondary | ICD-10-CM

## 2022-06-24 DIAGNOSIS — Z1231 Encounter for screening mammogram for malignant neoplasm of breast: Secondary | ICD-10-CM | POA: Diagnosis not present

## 2022-06-27 ENCOUNTER — Other Ambulatory Visit: Payer: Self-pay

## 2022-06-27 NOTE — Telephone Encounter (Signed)
Prescription Request  06/27/2022  LOV: 06/03/22  What is the name of the medication or equipment? rosuvastatin (CRESTOR) 40 MG tablet [[id number]]  Have you contacted your pharmacy to request a refill? Yes   Which pharmacy would you like this sent to?  [id number] [place], [place] - [address] [facility] [address] AT [facility] [address] [place] [address] Phone: [phone] Fax: [phone]    Patient notified that their request is being sent to the clinical staff for review and that they should receive a response within 2 business days.   Please advise at [facility] [phone]  Prescription Request  06/27/2022  LOV: 06/03/22  What is the name of the medication or equipment? bisoprolol (ZEBETA) 5 MG tablet [[id number]]  Have you contacted your pharmacy to request a refill? Yes   Which pharmacy would you like this sent to?  [id number] [place], [place] - [address] [address] AT [facility] [address] [place] [address] Phone: [phone] Fax: [phone]    Patient notified that their request is being sent to the clinical staff for review and that they should receive a response within 2 business days.   Please advise at [facility] [phone]   Prescription Request  06/27/2022  LOV: 06/03/22  What is the name of the medication or equipment? pioglitazone (ACTOS) 30 MG tablet [[id number]]  Have you contacted your pharmacy to request a refill? Yes   Which pharmacy would you like this sent to?  [id number] [place], [place] - [address] [facility] [address] AT [facility] [address] [place] [address] Phone: [phone] Fax: [phone]    Patient notified that their request is being sent to the clinical staff for review and that they should receive a response within 2 business days.   Please advise at [facility] [phone]

## 2022-06-28 NOTE — Telephone Encounter (Signed)
Unable to refill per protocol, Rx request is too soon, duplicate request. Next refill due in June.  Requested Prescriptions  Pending Prescriptions Disp Refills   rosuvastatin (CRESTOR) 40 MG tablet 90 tablet 1    Sig: TAKE 1 TABLET(40 MG) BY MOUTH EVERY NIGHT     Cardiovascular:  Antilipid - Statins 2 Failed - 06/27/2022 11:16 AM      Failed - Cr in normal range and within 360 days    Creat  Date Value Ref Range Status  06/03/2022 1.07 (H) 0.50 - 1.05 mg/dL Final   Creatinine, Urine  Date Value Ref Range Status  06/03/2022 33 20 - 275 mg/dL Final         Failed - Valid encounter within last 12 months    Recent Outpatient Visits           1 year ago Type 2 diabetes mellitus with diabetic neuropathy, unspecified whether long term insulin use (HCC)   [facility] [place], Priscille Heidelberg, MD   2 years ago Type 2 diabetes mellitus with diabetic neuropathy, unspecified whether long term insulin use (HCC)   [facility] Pick[place], Priscille Heidelberg, MD   2 years ago Gynecologic exam normal   [facility] Medicine Cathlean Marseilles A, NP   2 years ago Type 2 diabetes mellitus with other circulatory complication, without long-term current use of insulin (HCC)   [facility] Pick[place], Priscille Heidelberg, MD   2 years ago Injury of left toe, initial encounter   [facility] [place], Velna Hatchet, MD              Failed - Lipid Panel in normal range within the last 12 months    Cholesterol  Date Value Ref Range Status  06/03/2022 157 <200 mg/dL Final   LDL Cholesterol (Calc)  Date Value Ref Range Status  06/03/2022 71 mg/dL (calc) Final    Comment:    Reference range: <100 . Desirable range <100 mg/dL for primary prevention;   <70 mg/dL for patients with CHD or diabetic patients  with > or = 2 CHD risk factors. Marland Kitchen LDL-C is now calculated using the Martin-Hopkins  calculation, which is a validated novel method providing  better accuracy  than the Friedewald equation in the  estimation of LDL-C.  Horald Pollen et al. Lenox Ahr. 1610;960(45): 2061-2068  (http://education.[employer].com/faq/FAQ164)    Direct LDL  Date Value Ref Range Status  01/02/2014 50.6 mg/dL Final    Comment:    Optimal:  <100 mg/dLNear or Above Optimal:  100-129 mg/dLBorderline High:  130-159 mg/dLHigh:  160-189 mg/dLVery High:  >190 mg/dL   HDL  Date Value Ref Range Status  06/03/2022 50 > OR = 50 mg/dL Final   Triglycerides  Date Value Ref Range Status  06/03/2022 277 (H) <150 mg/dL Final    Comment:    . If a non-fasting specimen was collected, consider repeat triglyceride testing on a fasting specimen if clinically indicated.  Perry Mount et al. J. of Clin. Lipidol. 2015;9:129-169. Marland Kitchen          Passed - Patient is not pregnant

## 2022-06-30 ENCOUNTER — Other Ambulatory Visit: Payer: Self-pay | Admitting: Family Medicine

## 2022-06-30 NOTE — Telephone Encounter (Signed)
Requested medication (s) are due for refill today: for review  Requested medication (s) are on the active medication list: yes    Last refill: 06/06/22  #90 1 refill  Future visit scheduled no  Notes to clinic: Per pharmacy, please review   : Alternative Requested:NOT COVERED PLEASE ADVISE.    Requested Prescriptions  Pending Prescriptions Disp Refills   dapagliflozin propanediol (FARXIGA) 10 MG TABS tablet [Pharmacy Med Name: DAPAGLIFLOZIN 10 MG TABLET] 90 tablet 1    Sig: TAKE 1 TABLET BY MOUTH DAILY BEFORE BREAKFAST.     Endocrinology:  Diabetes - SGLT2 Inhibitors Failed - 06/30/2022  3:20 PM      Failed - Cr in normal range and within 360 days    Creat  Date Value Ref Range Status  06/03/2022 1.07 (H) 0.50 - 1.05 mg/dL Final   Creatinine, Urine  Date Value Ref Range Status  06/03/2022 33 20 - 275 mg/dL Final         Failed - eGFR in normal range and within 360 days    GFR, Est African American  Date Value Ref Range Status  04/03/2020 69 > OR = 60 mL/min/1.102m2 Final   GFR, Est Non African American  Date Value Ref Range Status  04/03/2020 59 (L) > OR = 60 mL/min/1.76m2 Final   GFR, Estimated  Date Value Ref Range Status  06/30/2021 58 (L) >60 mL/min Final    Comment:    (NOTE) Calculated using the CKD-EPI Creatinine Equation (2021)    GFR  Date Value Ref Range Status  12/07/2017 59.28 (L) >60.00 mL/min Final   eGFR  Date Value Ref Range Status  06/03/2022 58 (L) > OR = 60 mL/min/1.76m2 Final         Failed - Valid encounter within last 6 months    Recent Outpatient Visits           1 year ago Type 2 diabetes mellitus with diabetic neuropathy, unspecified whether long term insulin use (HCC)   [facility] Pickard, Priscille Heidelberg, MD   2 years ago Type 2 diabetes mellitus with diabetic neuropathy, unspecified whether long term insulin use (HCC)   [facility] Donita Brooks, MD   2 years ago Gynecologic exam normal   [facility] Valentino Nose, NP   2 years ago Type 2 diabetes mellitus with other circulatory complication, without long-term current use of insulin (HCC)   [facility] Pickard, Priscille Heidelberg, MD   2 years ago Injury of left toe, initial encounter   [facility] [place], Velna Hatchet, MD              Passed - HBA1C is between 0 and 7.9 and within 180 days    Hgb A1c MFr Bld  Date Value Ref Range Status  06/03/2022 7.6 (H) <5.7 % of total Hgb Final    Comment:    For someone without known diabetes, a hemoglobin A1c value of 6.5% or greater indicates that they may have  diabetes and this should be confirmed with a follow-up  test. . For someone with known diabetes, a value <7% indicates  that their diabetes is well controlled and a value  greater than or equal to 7% indicates suboptimal  control. A1c targets should be individualized based on  duration of diabetes, age, comorbid conditions, and  other considerations. . Currently, no consensus exists regarding use of hemoglobin A1c for diagnosis of diabetes for children. Marland Kitchen

## 2022-07-05 ENCOUNTER — Other Ambulatory Visit: Payer: Self-pay | Admitting: Family Medicine

## 2022-07-05 DIAGNOSIS — I739 Peripheral vascular disease, unspecified: Secondary | ICD-10-CM

## 2022-07-05 DIAGNOSIS — M79604 Pain in right leg: Secondary | ICD-10-CM

## 2022-07-05 NOTE — Telephone Encounter (Signed)
Requested medication (s) are due for refill today - no  Requested medication (s) are on the active medication list -yes  Future visit scheduled -no  Last refill: 06/23/22 #120  Notes to clinic: non delegated Rx  Requested Prescriptions  Pending Prescriptions Disp Refills   methocarbamol (ROBAXIN) 500 MG tablet [Pharmacy Med Name: METHOCARBAMOL 500 MG TABLET] 120 tablet 0    Sig: TAKE 1 TABLET BY MOUTH EVERY 6 HOURS AS NEEDED FOR MUSCLE SPASM     Not Delegated - Analgesics:  Muscle Relaxants Failed - 07/05/2022  9:28 AM      Failed - This refill cannot be delegated      Failed - Valid encounter within last 6 months    Recent Outpatient Visits           1 year ago Type 2 diabetes mellitus with diabetic neuropathy, unspecified whether long term insulin use (HCC)   [facility] Pickard, Priscille Heidelberg, MD   2 years ago Type 2 diabetes mellitus with diabetic neuropathy, unspecified whether long term insulin use (HCC)   [facility] Pickard, Priscille Heidelberg, MD   2 years ago Gynecologic exam normal   [facility] Cathlean Marseilles A, NP   2 years ago Type 2 diabetes mellitus with other circulatory complication, without long-term current use of insulin (HCC)   [facility] Pickard, Priscille Heidelberg, MD   2 years ago Injury of left toe, initial encounter   [facility] [place], Velna Hatchet, MD                 Requested Prescriptions  Pending Prescriptions Disp Refills   methocarbamol (ROBAXIN) 500 MG tablet [Pharmacy Med Name: METHOCARBAMOL 500 MG TABLET] 120 tablet 0    Sig: TAKE 1 TABLET BY MOUTH EVERY 6 HOURS AS NEEDED FOR MUSCLE SPASM     Not Delegated - Analgesics:  Muscle Relaxants Failed - 07/05/2022  9:28 AM      Failed - This refill cannot be delegated      Failed - Valid encounter within last 6 months    Recent Outpatient Visits           1 year ago Type 2 diabetes mellitus with diabetic neuropathy,  unspecified whether long term insulin use (HCC)   [facility] Pickard, Priscille Heidelberg, MD   2 years ago Type 2 diabetes mellitus with diabetic neuropathy, unspecified whether long term insulin use (HCC)   [facility] Pickard, Priscille Heidelberg, MD   2 years ago Gynecologic exam normal   [facility] Medicine Cathlean Marseilles A, NP   2 years ago Type 2 diabetes mellitus with other circulatory complication, without long-term current use of insulin (HCC)   [facility] [place], Priscille Heidelberg, MD   2 years ago Injury of left toe, initial encounter   [facility] [place], Velna Hatchet, MD

## 2022-07-05 NOTE — Telephone Encounter (Signed)
Notes to clinic: Duplicate request, non delegated Rx  Requested Prescriptions  Pending Prescriptions Disp Refills   methocarbamol (ROBAXIN) 500 MG tablet [Pharmacy Med Name: METHOCARBAMOL 500 MG TABLET] 120 tablet 0    Sig: TAKE 1 TABLET BY MOUTH EVERY 6 HOURS AS NEEDED FOR MUSCLE SPASM     Not Delegated - Analgesics:  Muscle Relaxants Failed - 07/05/2022  9:28 AM      Failed - This refill cannot be delegated      Failed - Valid encounter within last 6 months    Recent Outpatient Visits           1 year ago Type 2 diabetes mellitus with diabetic neuropathy, unspecified whether long term insulin use (HCC)   [facility] [place], Priscille Heidelberg, MD   2 years ago Type 2 diabetes mellitus with diabetic neuropathy, unspecified whether long term insulin use (HCC)   [facility] [place], Priscille Heidelberg, MD   2 years ago Gynecologic exam normal   [facility] Cathlean Marseilles A, NP   2 years ago Type 2 diabetes mellitus with other circulatory complication, without long-term current use of insulin (HCC)   [facility] [place], Priscille Heidelberg, MD   2 years ago Injury of left toe, initial encounter   [facility] [place], Velna Hatchet, MD                 Requested Prescriptions  Pending Prescriptions Disp Refills   methocarbamol (ROBAXIN) 500 MG tablet [Pharmacy Med Name: METHOCARBAMOL 500 MG TABLET] 120 tablet 0    Sig: TAKE 1 TABLET BY MOUTH EVERY 6 HOURS AS NEEDED FOR MUSCLE SPASM     Not Delegated - Analgesics:  Muscle Relaxants Failed - 07/05/2022  9:28 AM      Failed - This refill cannot be delegated      Failed - Valid encounter within last 6 months    Recent Outpatient Visits           1 year ago Type 2 diabetes mellitus with diabetic neuropathy, unspecified whether long term insulin use (HCC)   [facility] [place], Priscille Heidelberg, MD   2 years ago Type 2 diabetes mellitus with diabetic neuropathy,  unspecified whether long term insulin use (HCC)   [facility] [place], Priscille Heidelberg, MD   2 years ago Gynecologic exam normal   [facility] Cathlean Marseilles A, NP   2 years ago Type 2 diabetes mellitus with other circulatory complication, without long-term current use of insulin (HCC)   [facility] [place], Priscille Heidelberg, MD   2 years ago Injury of left toe, initial encounter   [facility] [place], Velna Hatchet, MD

## 2022-07-05 NOTE — Telephone Encounter (Signed)
Prescription Request  07/05/2022  LOV: 06/03/2022  What is the name of the medication or equipment? pioglitazone (ACTOS) 30 MG tablet [[id number]]   Have you contacted your pharmacy to request a refill? Yes   Which pharmacy would you like this sent to?  [id number] [place], [place] - [address] [place] [address] Phone: [phone] Fax: [phone]    Patient notified that their request is being sent to the clinical staff for review and that they should receive a response within 2 business days.   Please advise at [facility] [phone]

## 2022-07-05 NOTE — Telephone Encounter (Signed)
Prescription Request  07/05/2022  LOV: 06/03/2022  What is the name of the medication or equipment? bisoprolol (ZEBETA) 5 MG tablet [[id number]]  Have you contacted your pharmacy to request a refill? Yes   Which pharmacy would you like this sent to?  [id number] [place], [place] - [address] [place] [address] Phone: [phone] Fax: [phone]    Patient notified that their request is being sent to the clinical staff for review and that they should receive a response within 2 business days.   Please advise at [facility] [phone]

## 2022-07-05 NOTE — Telephone Encounter (Signed)
Requested medication (s) are due for refill today - no  Requested medication (s) are on the active medication list -yes  Future visit scheduled -no  Last refill: 06/23/22 #120  Notes to clinic: duplicate request, non delegated Rx  Requested Prescriptions  Pending Prescriptions Disp Refills   methocarbamol (ROBAXIN) 500 MG tablet [Pharmacy Med Name: METHOCARBAMOL 500 MG TABLET] 120 tablet 0    Sig: TAKE 1 TABLET BY MOUTH EVERY 6 HOURS AS NEEDED FOR MUSCLE SPASM     Not Delegated - Analgesics:  Muscle Relaxants Failed - 07/05/2022  9:28 AM      Failed - This refill cannot be delegated      Failed - Valid encounter within last 6 months    Recent Outpatient Visits           1 year ago Type 2 diabetes mellitus with diabetic neuropathy, unspecified whether long term insulin use (HCC)   [facility] [place], Priscille Heidelberg, MD   2 years ago Type 2 diabetes mellitus with diabetic neuropathy, unspecified whether long term insulin use (HCC)   [facility] [place], Priscille Heidelberg, MD   2 years ago Gynecologic exam normal   [facility] Cathlean Marseilles A, NP   2 years ago Type 2 diabetes mellitus with other circulatory complication, without long-term current use of insulin (HCC)   [facility] [place], Priscille Heidelberg, MD   2 years ago Injury of left toe, initial encounter   [facility] [place], Velna Hatchet, MD                 Requested Prescriptions  Pending Prescriptions Disp Refills   methocarbamol (ROBAXIN) 500 MG tablet [Pharmacy Med Name: METHOCARBAMOL 500 MG TABLET] 120 tablet 0    Sig: TAKE 1 TABLET BY MOUTH EVERY 6 HOURS AS NEEDED FOR MUSCLE SPASM     Not Delegated - Analgesics:  Muscle Relaxants Failed - 07/05/2022  9:28 AM      Failed - This refill cannot be delegated      Failed - Valid encounter within last 6 months    Recent Outpatient Visits           1 year ago Type 2 diabetes mellitus with diabetic  neuropathy, unspecified whether long term insulin use (HCC)   [facility] [place], Priscille Heidelberg, MD   2 years ago Type 2 diabetes mellitus with diabetic neuropathy, unspecified whether long term insulin use (HCC)   [facility] [place], Priscille Heidelberg, MD   2 years ago Gynecologic exam normal   [facility] Cathlean Marseilles A, NP   2 years ago Type 2 diabetes mellitus with other circulatory complication, without long-term current use of insulin (HCC)   [facility] [place], Priscille Heidelberg, MD   2 years ago Injury of left toe, initial encounter   [facility] [place], Velna Hatchet, MD

## 2022-07-06 ENCOUNTER — Ambulatory Visit (INDEPENDENT_AMBULATORY_CARE_PROVIDER_SITE_OTHER): Payer: HMO

## 2022-07-06 DIAGNOSIS — E1140 Type 2 diabetes mellitus with diabetic neuropathy, unspecified: Secondary | ICD-10-CM

## 2022-07-06 NOTE — Progress Notes (Signed)
Pt here with c/o being unable to get One Touch Ultra glucometer to work. I was able to take a look at the patient's glucometer and was able to get the meter to work and demonstrate how to work it for the patient. Patient, after observing me, was able to demonstrate how to use the meter, without issue. Mjp,lpn

## 2022-07-07 ENCOUNTER — Telehealth: Payer: Self-pay | Admitting: Cardiovascular Disease

## 2022-07-07 NOTE — Telephone Encounter (Signed)
Pt c/o swelling: STAT is pt has developed SOB within 24 hours  How much weight have you gained and in what time span? Not sure   If swelling, where is the swelling located? Right leg mostly, but some swelling in left.    Are you currently taking a fluid pill? No   Are you currently SOB? No   Do you have a log of your daily weights (if so, list)? No   Have you gained 3 pounds in a day or 5 pounds in a week? N/A   Have you traveled recently? No.     Patient would like to also discuss upcoming appt and test she states she needs to have done.

## 2022-07-07 NOTE — Telephone Encounter (Signed)
Message states "[employer] customer is not available"  LM to cal our office

## 2022-07-08 NOTE — Telephone Encounter (Signed)
LVM to please return call to office 

## 2022-07-08 NOTE — Telephone Encounter (Signed)
Patient is returning call.  °

## 2022-07-08 NOTE — Telephone Encounter (Signed)
Pt reports that both of her legs hurt  The right one hurts the most; and the left one has the most swelling. She said that it does not swell too bad.   She was told to do more walking to see if that helps, she says that it hasn't made a difference. Pt reports that she gets cramps in her legs at night and has pain in both legs throughout the day and night. Sometimes it keeps her up at night.   She is overdue for an appt; Appt made for 07/19/22 at 0800. She was wondering if she needed to have any testing prior to this appointment due to all the pain and swelling in her legs.   I told her that I would send this information to her provider and we would get back to her with any recommendations. She verbalized understanding.

## 2022-07-08 NOTE — Telephone Encounter (Signed)
She does not need any testing before her appointment.  Thanks.

## 2022-07-12 NOTE — Telephone Encounter (Signed)
Spouse (dpr) answered phone, pt in the shower. Gave him the Dr's message, that she does not need any testing before her appointment. He will give this message to the patient.

## 2022-07-15 ENCOUNTER — Other Ambulatory Visit: Payer: Self-pay | Admitting: Family Medicine

## 2022-07-15 ENCOUNTER — Telehealth: Payer: Self-pay

## 2022-07-15 MED ORDER — HYDROCODONE-ACETAMINOPHEN 5-325 MG PO TABS: 1.0000 | ORAL_TABLET | Freq: Four times a day (QID) | ORAL | 0 refills | Status: DC | PRN

## 2022-07-15 NOTE — Telephone Encounter (Signed)
Pt called requesting a refill on her Hydrocodne 5/325 mg.  Last RF 06/17/2022. Last OV 06/03/2022. Thanks.

## 2022-07-18 NOTE — Progress Notes (Unsigned)
Cardiology Office Note   Date:  07/19/2022   ID:  [name], DOB [birth year], MRN [id number]  PCP:  Donita Brooks, MD  Cardiologist:  Jessy Oto chief complaint on file.      History of Present Illness: [name] is a 67 y.o. female who presents for a follow up visit regarding regarding peripheral arterial disease and coronary artery disease. She has known history of peripheral arterial disease status post atherectomy of the right SFA in 2011 by Dr. Sanjuana Kava. She has other chronic medical conditions including CAD, DM, HTN, chronic back pain, recurrent idiopathic pancreatitis and hyperlipidemia. She is a former smoker.  She is s/p bilateral TKR.   She had worsening right leg claudication in April 2017. Angiography showed no significant aortoiliac disease. There was moderate right common femoral artery stenosis and severe discrete stenosis in the proximal right SFA with three-vessel runoff below the knee. There was no significant obstructive disease involving the left lower extremity. A self-expanding stent was placed to the proximal right SFA. She had previous bilateral knee replacement and chronic bilateral leg pain.   She had worsening left leg claudication in 2021.  Vascular studies showed mildly reduced ABI bilaterally in the 0.8 range.  Duplex showed moderate right common femoral artery stenosis and moderate SFA disease.  On the left, there was severe new stenosis in the proximal SFA. Angiography was done in March of 2021 which showed borderline stenosis affecting the left common iliac artery, severe calcified stenosis affecting the left proximal SFA and three-vessel runoff below the knee.  On the right, there was severe calcified disease affecting the common femoral artery with patent proximal SFA stent followed by 60% disease throughout the whole SFA and three-vessel runoff below the knee.  I performed successful orbital atherectomy and drug-coated balloon  angioplasty to the left SFA.   She underwent staged right common femoral artery endarterectomy by Dr. Chestine Spore in August of 2021.  Most recent arterial Doppler studies in October 2023 showed a drop in ABI on the right side to 0.75 and normal on the left at 0.98.  Duplex showed moderately elevated velocities in the right SFA and popliteal arteries in the 300 range.  No obstructive disease on the left.  She reports worsening bilateral calf claudication especially on the right side.  She used to be able to walk [phone] steps per day last year but had to cut down significantly due to her claudication.  She denies chest pain or shortness of breath.  On the left side, she is mostly bothered by lower extremity edema.   Past Medical History:  Diagnosis Date   Aortic atherosclerosis (HCC)    Asthma    CAD (coronary artery disease)    a. LHC 2011: Diffuse distal and branch vessel disease - patient managed medically, no interventional options. b. Nuc 03/2014 - low risk, no ischemia.   Complication of anesthesia    slow to wake up with last surgery in 2011    Diabetes mellitus with circulatory complication (HCC)    Diverticulosis    Dyslipidemia    GERD (gastroesophageal reflux disease)    Headache    hx of migraines    Hypertension    Osteoarthritis    severe right knee  R TKR   PAD (peripheral artery disease) (HCC)    a. Severe stenosis mid right SFA s/p atherectomy 03/25/09. b. peripheral angiography in 12/2011 which showed only about 50% diffuse right SFA stenosis.   Pancreatitis  2003   Renal artery stenosis (HCC)    a. LHC 2011 - 40-50% left RAS.   Tobacco use disorder    quit 11/10    Past Surgical History:  Procedure Laterality Date   ABDOMINAL AORTAGRAM N/A 12/21/2011   Procedure: ABDOMINAL AORTAGRAM;  Surgeon: Iran Ouch, MD;  Location: [facility];  Service: Cardiovascular;  Laterality: N/A;   ABDOMINAL AORTOGRAM W/LOWER EXTREMITY Bilateral 05/08/2019   Procedure: ABDOMINAL  AORTOGRAM W/LOWER EXTREMITY;  Surgeon: Iran Ouch, MD;  Location: [facility];  Service: Cardiovascular;  Laterality: Bilateral;   CARDIAC CATHETERIZATION     ENDARTERECTOMY FEMORAL Right 10/10/2019   Procedure: RIGHT FEMORAL ENDARTERECTOMY;  Surgeon: Cephus Shelling, MD;  Location: [facility] OR;  Service: Vascular;  Laterality: Right;   ESOPHAGOGASTRODUODENOSCOPY (EGD) WITH PROPOFOL N/A 10/05/2017   Procedure: ESOPHAGOGASTRODUODENOSCOPY (EGD) WITH PROPOFOL;  Surgeon: Rachael Fee, MD;  Location: [facility];  Service: Endoscopy;  Laterality: N/A;   EUS N/A 10/05/2017   Procedure: UPPER ENDOSCOPIC ULTRASOUND (EUS) RADIAL;  Surgeon: Rachael Fee, MD;  Location: [facility];  Service: Endoscopy;  Laterality: N/A;   FOOT SURGERY Left    LOWER EXTREMITY ANGIOGRAM Bilateral 05/27/2015   Procedure: Lower Extremity Angiogram;  Surgeon: Iran Ouch, MD;  Location: [facility];  Service: Cardiovascular;  Laterality: Bilateral;   PATCH ANGIOPLASTY Right 10/10/2019   Procedure: PATCH ANGIOPLASTY USING Livia Snellen BIOLOGIC PATCH;  Surgeon: Cephus Shelling, MD;  Location: [facility] OR;  Service: Vascular;  Laterality: Right;   PERIPHERAL VASCULAR ATHERECTOMY Left 05/08/2019   Procedure: PERIPHERAL VASCULAR ATHERECTOMY;  Surgeon: Iran Ouch, MD;  Location: [facility];  Service: Cardiovascular;  Laterality: Left;   PERIPHERAL VASCULAR CATHETERIZATION N/A 05/27/2015   Procedure: Abdominal Aortogram;  Surgeon: Iran Ouch, MD;  Location: [facility];  Service: Cardiovascular;  Laterality: N/A;   PERIPHERAL VASCULAR CATHETERIZATION Right 05/27/2015   Procedure: Peripheral Vascular Intervention;  Surgeon: Iran Ouch, MD;  Location: [facility];  Service: Cardiovascular;  Laterality: Right;  SFA   TOTAL KNEE ARTHROPLASTY Right 2011   TOTAL KNEE ARTHROPLASTY Left 05/22/2014   Procedure: LEFT TOTAL KNEE ARTHROPLASTY;  Surgeon: Ranee Gosselin, MD;  Location:  [facility]S;  Service: Orthopedics;  Laterality: Left;   TRIGGER FINGER RELEASE Left 07/07/2021   Procedure: LEFT INDEX FINGER RELEASE TRIGGER FINGER/A-1 PULLEY / CARPOMETACARPAL INJECTION;  Surgeon: Marlyne Beards, MD;  Location: [facility];  Service: Orthopedics;  Laterality: Left;   TUBAL LIGATION  1980     Current Outpatient Medications  Medication Sig Dispense Refill   acetaminophen (TYLENOL) 500 MG tablet Take 1,000 mg by mouth every 6 (six) hours as needed for moderate pain or headache.     albuterol (VENTOLIN HFA) 108 (90 Base) MCG/ACT inhaler Inhale 2 puffs into the lungs every 6 (six) hours as needed for wheezing or shortness of breath. 18 g 1   Biotin w/ Vitamins C & E (HAIR/SKIN/NAILS PO) Take 1 tablet by mouth daily.     bisoprolol (ZEBETA) 5 MG tablet TAKE 1/2 TABLET(2.5 MG) BY MOUTH DAILY 45 tablet 3   cilostazol (PLETAL) 50 MG tablet Take 1 tablet (50 mg total) by mouth 2 (two) times daily. 180 tablet 0   clopidogrel (PLAVIX) 75 MG tablet TAKE 1 TABLET(75 MG) BY MOUTH DAILY 90 tablet 1   dapagliflozin propanediol (FARXIGA) 10 MG TABS tablet TAKE 1 TABLET BY MOUTH DAILY BEFORE BREAKFAST. 90 tablet 1   diclofenac  sodium (VOLTAREN) 1 % GEL Apply 2 g topically 4 (four) times daily. (Patient taking differently: Apply 2 g topically 4 (four) times daily as needed (for hand pain).) 100 g 0   fluticasone-salmeterol (ADVAIR DISKUS) 250-50 MCG/ACT AEPB Inhale 1 puff into the lungs in the morning and at bedtime. 60 each 11   HYDROcodone-acetaminophen (NORCO/VICODIN) 5-325 MG tablet Take 1 tablet by mouth every 6 (six) hours as needed for moderate pain. Chronic Pain. Dx: G89.4 Patient needs to schedule office visit 130 tablet 0   isosorbide mononitrate (IMDUR) 60 MG 24 hr tablet TAKE 1 TABLET(60 MG) BY MOUTH DAILY 90 tablet 0   lipase/protease/amylase (CREON) 36000 UNITS CPEP capsule Take 2 capsules (72,000 Units total) by mouth 3 (three) times daily with meals. May also take 1  capsule (36,000 Units total) as needed (with snacks). (Patient taking differently: Take 3 capsules (108,000 Units total) by mouth 3 (three) times daily with meals. May also take 1 capsule (36,000 Units total) as needed (with snacks).) 240 capsule 11   Loratadine (CLARITIN PO) Take 1 tablet by mouth daily.     metFORMIN (GLUCOPHAGE) 1000 MG tablet Take 1 tablet (1,000 mg total) by mouth 2 (two) times daily with a meal. 180 tablet 3   methocarbamol (ROBAXIN) 500 MG tablet TAKE 1 TABLET BY MOUTH EVERY 6 HOURS AS NEEDED FOR MUSCLE SPASM 120 tablet 0   methylPREDNISolone (MEDROL DOSEPAK) 4 MG TBPK tablet follow package directions 21 tablet 0   Multiple Vitamin (MULTIVITAMIN) tablet Take 1 tablet by mouth daily.     nitroGLYCERIN (NITROSTAT) 0.4 MG SL tablet Place 1 tablet under the tongue every 5 (five) minutes as needed for chest pain.     pantoprazole (PROTONIX) 40 MG tablet Take 1 tablet (40 mg total) by mouth daily. 90 tablet 3   pioglitazone (ACTOS) 30 MG tablet TAKE 1 TABLET(30 MG) BY MOUTH DAILY 90 tablet 1   pregabalin (LYRICA) 50 MG capsule Take 1 capsule (50 mg total) by mouth 3 (three) times daily. 90 capsule 3   Probiotic Product (TRUBIOTICS PO) Take 1 capsule by mouth daily.     promethazine (PHENERGAN) 25 MG tablet TAKE 1 TABLET(25 MG) BY MOUTH EVERY 8 HOURS AS NEEDED FOR NAUSEA OR VOMITING 30 tablet 0   rosuvastatin (CRESTOR) 40 MG tablet TAKE 1 TABLET(40 MG) BY MOUTH EVERY NIGHT 90 tablet 1   sitaGLIPtin (JANUVIA) 100 MG tablet TAKE 1 TABLET BY MOUTH EVERY DAY 30 tablet 2   vitamin B-12 (CYANOCOBALAMIN) 1000 MCG tablet Take 1,000 mcg by mouth daily.     Current Facility-Administered Medications  Medication Dose Route Frequency Provider Last Rate Last Admin   triamcinolone acetonide (KENALOG) 10 MG/ML injection 10 mg  10 mg Other Once Regal, Norman S, DPM        Allergies:   Gabapentin, Glucotrol [glipizide], Influenza vaccines, Latex, Nortriptyline, Tetanus toxoid, Other, Prednisone &  diphenhydramine, and Invokana [canagliflozin]    Social History:  The patient  reports that she quit smoking about 13 years ago. Her smoking use included cigarettes. She has never used smokeless tobacco. She reports that she does not drink alcohol and does not use drugs.   Family History:  The patient's family history includes Breast cancer (age of onset: 55) in her sister; CVA in her mother; Emphysema in her father.    ROS:  Please see the history of present illness.   Otherwise, review of systems are positive for .   All other systems are reviewed and negative.  PHYSICAL EXAM: VS:  BP 128/76 (BP Location: Left Arm, Patient Position: Sitting, Cuff Size: Large)   Pulse 84   Ht 5' 2.5" (1.588 m)   Wt 201 lb 12.8 oz (91.5 kg)   SpO2 97%   BMI 36.32 kg/m  , BMI Body mass index is 36.32 kg/m. GEN: Well nourished, well developed, in no acute distress  HEENT: normal  Neck: no JVD, carotid bruits, or masses Cardiac: RRR; no murmurs, rubs, or gallops,no edema  Respiratory:  clear to auscultation bilaterally, normal work of breathing GI: soft, nontender, nondistended, + BS MS: no deformity or atrophy  Skin: warm and dry, no rash Neuro:  Strength and sensation are intact Psych: euthymic mood, full affect Vascular: Femoral pulses: Normal bilaterally.  Distal pulses are normal on the left and diminished on the right  EKG:  EKG is ordered today. EKG showed normal sinus rhythm with no significant ST or T wave changes.  Low voltage.    Recent Labs: 06/03/2022: ALT 17; BUN 20; Creat 1.07; Hemoglobin 11.6; Platelets 224; Potassium 4.6; Sodium 141    Lipid Panel    Component Value Date/Time   CHOL 157 06/03/2022 1054   TRIG 277 (H) 06/03/2022 1054   HDL 50 06/03/2022 1054   CHOLHDL 3.1 06/03/2022 1054   VLDL 35 10/11/2019 0219   LDLCALC 71 06/03/2022 1054   LDLDIRECT 50.6 01/02/2014 1003      Wt Readings from Last 3 Encounters:  07/19/22 201 lb 12.8 oz (91.5 kg)  06/03/22 200  lb (90.7 kg)  02/10/22 191 lb (86.6 kg)       ASSESSMENT AND PLAN:  1.  PAD with severe lifestyle limiting claudication: Status post left SFA atherectomy and drug-coated balloon angioplasty as well as right common femoral artery endarterectomy.   She is mostly bothered by severe right calf claudication likely due to diffuse SFA and popliteal artery disease.  I discussed with her different management options and recommended a trial of cilostazol 50 mg twice daily and a walking exercise program.  Follow-up in 3 months and if there is no improvement, angiography and endovascular intervention can be considered. The pain involving the left leg is likely due to chronic venous insufficiency and not PAD.  2. Coronary artery disease involving native coronary arteries with other forms of angina:   Previous cardiac catheterization in 2011 showed distal LAD and small branch disease which is being managed medically.  Most recent Lexiscan Myoview in May 2022 showed evidence of mild anterior wall ischemia.  She has been treated medically given stability of symptoms.  3. Hyperlipidemia:  Continue treatment with high-dose rosuvastatin.  I reviewed recent lipid profile done in April which showed an LDL of 71.  4. Diabetes mellitus: Managed by primary care physician.    Disposition:   FU with me in 3 months  Signed,  Lorine Bears, MD  07/19/2022 8:29 AM    [facility]

## 2022-07-19 ENCOUNTER — Ambulatory Visit: Payer: HMO | Attending: Cardiovascular Disease | Admitting: Cardiovascular Disease

## 2022-07-19 ENCOUNTER — Encounter: Payer: Self-pay | Admitting: Cardiovascular Disease

## 2022-07-19 ENCOUNTER — Other Ambulatory Visit: Payer: Self-pay | Admitting: Family Medicine

## 2022-07-19 VITALS — BP 128/76 | HR 84 | Ht 62.5 in | Wt 201.8 lb

## 2022-07-19 DIAGNOSIS — E785 Hyperlipidemia, unspecified: Secondary | ICD-10-CM | POA: Diagnosis not present

## 2022-07-19 DIAGNOSIS — I25118 Atherosclerotic heart disease of native coronary artery with other forms of angina pectoris: Secondary | ICD-10-CM

## 2022-07-19 DIAGNOSIS — I739 Peripheral vascular disease, unspecified: Secondary | ICD-10-CM

## 2022-07-19 DIAGNOSIS — M79604 Pain in right leg: Secondary | ICD-10-CM

## 2022-07-19 MED ORDER — CILOSTAZOL 50 MG PO TABS: 50.0000 mg | ORAL_TABLET | Freq: Two times a day (BID) | ORAL | 0 refills | Status: DC

## 2022-07-19 NOTE — Telephone Encounter (Signed)
Requested medications are due for refill today.  yes  Requested medications are on the active medications list.  yes  Last refill. 06/23/2022 #120 0 rf  Future visit scheduled.   no  Notes to clinic.  Refill not delegated.    Requested Prescriptions  Pending Prescriptions Disp Refills   methocarbamol (ROBAXIN) 500 MG tablet [Pharmacy Med Name: METHOCARBAMOL 500 MG TABLET] 120 tablet 0    Sig: TAKE 1 TABLET BY MOUTH EVERY 6 HOURS AS NEEDED FOR MUSCLE SPASM     Not Delegated - Analgesics:  Muscle Relaxants Failed - 07/19/2022 11:24 AM      Failed - This refill cannot be delegated      Failed - Valid encounter within last 6 months    Recent Outpatient Visits           1 year ago Type 2 diabetes mellitus with diabetic neuropathy, unspecified whether long term insulin use (HCC)   [facility] [place], Priscille Heidelberg, MD   2 years ago Type 2 diabetes mellitus with diabetic neuropathy, unspecified whether long term insulin use (HCC)   [facility] [place], Priscille Heidelberg, MD   2 years ago Gynecologic exam normal   [facility] Cathlean Marseilles A, NP   2 years ago Type 2 diabetes mellitus with other circulatory complication, without long-term current use of insulin (HCC)   [facility] [place], Priscille Heidelberg, MD   2 years ago Injury of left toe, initial encounter   [facility] [place], Velna Hatchet, MD       Future Appointments             In 3 months Kirke Corin, Chelsea Aus, MD [facility] at [facility]

## 2022-07-19 NOTE — Patient Instructions (Signed)
Medication Instructions:  START Cilostazol (Pletal) 50 mg twice daily  *If you need a refill on your cardiac medications before your next appointment, please call your pharmacy*   Lab Work: None ordered If you have labs (blood work) drawn today and your tests are completely normal, you will receive your results only by: MyChart Message (if you have MyChart) OR A paper copy in the mail If you have any lab test that is abnormal or we need to change your treatment, we will call you to review the results.   Testing/Procedures: None ordered   Follow-Up: At [facility], you and your health needs are our priority.  As part of our continuing mission to provide you with exceptional heart care, we have created designated Provider Care Teams.  These Care Teams include your primary Cardiologist (physician) and Advanced Practice Providers (APPs -  Physician Assistants and Nurse Practitioners) who all work together to provide you with the care you need, when you need it.  We recommend signing up for the patient portal called "MyChart".  Sign up information is provided on this After Visit Summary.  MyChart is used to connect with patients for Virtual Visits (Telemedicine).  Patients are able to view lab/test results, encounter notes, upcoming appointments, etc.  Non-urgent messages can be sent to your provider as well.   To learn more about what you can do with MyChart, go to [email or web address].    Your next appointment:   3 month(s)  Provider:   Dr. Kirke Corin Other Instructions EXERCISE PROGRAM FOR INDIVIDUALS WITH  PERIPHERAL ARTERIAL DISEASE (PAD)   General Information:   Research in vascular exercise has demonstrated remarkable improvement in symptoms of leg pain (claudication) without expensive or invasive interventions. Regular walking programs are extremely helpful for patients with PAD and intermittent claudication.  These steps are designed to help you get started with a safe  and effective program to help you walk farther with less pain:   Walk at least three times a week (preferably every day).  Your goal is to build up to 30-45 minutes of total walking time (not counting rest breaks). It may take you several weeks to build up your exercise time starting at 5-10 minutes or whatever you can tolerate.  Walk as far as possible using moderate to maximal pain (7-8 on the scale below) as a signal to stop, and resume walking when the pain goes away.  On a treadmill, set the speed and grade at a level that brings on the claudication pain within 3 to 5 minutes. Walk at this rate until you experience claudication of moderate severity, rest until the pain improves, and then resume walking.  Over time, you will be able to walk longer at the designated speed and grade; workload should then be increased until you develop the pain within 3 to 5 minutes once again.  This regimen will induce a significant benefit. Studies have demonstrated that participants may be able to walk up to three or four times farther and have less leg pain, within twelve weeks, by following this protocol.  Pain Scale    0_____1_____2_____3_____4_____5_____6_____7_____8_____9_____10   No Pain                                   Moderate Pain  Maximal Pain

## 2022-07-20 ENCOUNTER — Other Ambulatory Visit: Payer: Self-pay

## 2022-07-20 NOTE — Telephone Encounter (Signed)
Requested medication (s) are due for refill today - yes  Requested medication (s) are on the active medication list -yes  Future visit scheduled -no  Last refill: 06/23/22 #120  Notes to clinic: non delegated Rx  Requested Prescriptions  Pending Prescriptions Disp Refills   methocarbamol (ROBAXIN) 500 MG tablet [Pharmacy Med Name: METHOCARBAMOL 500 MG TABLET] 120 tablet 0    Sig: TAKE 1 TABLET BY MOUTH EVERY 6 HOURS AS NEEDED FOR MUSCLE SPASM     Not Delegated - Analgesics:  Muscle Relaxants Failed - 07/20/2022  9:41 AM      Failed - This refill cannot be delegated      Failed - Valid encounter within last 6 months    Recent Outpatient Visits           1 year ago Type 2 diabetes mellitus with diabetic neuropathy, unspecified whether long term insulin use (HCC)   [facility] [place], Priscille Heidelberg, MD   2 years ago Type 2 diabetes mellitus with diabetic neuropathy, unspecified whether long term insulin use (HCC)   [facility] [place], Priscille Heidelberg, MD   2 years ago Gynecologic exam normal   [facility] Cathlean Marseilles A, NP   2 years ago Type 2 diabetes mellitus with other circulatory complication, without long-term current use of insulin (HCC)   [facility] [place], Priscille Heidelberg, MD   2 years ago Injury of left toe, initial encounter   [facility] [place], Velna Hatchet, MD       Future Appointments             In 3 months Kirke Corin, Chelsea Aus, MD [place] [facility] at [facility]               Requested Prescriptions  Pending Prescriptions Disp Refills   methocarbamol (ROBAXIN) 500 MG tablet [Pharmacy Med Name: METHOCARBAMOL 500 MG TABLET] 120 tablet 0    Sig: TAKE 1 TABLET BY MOUTH EVERY 6 HOURS AS NEEDED FOR MUSCLE SPASM     Not Delegated - Analgesics:  Muscle Relaxants Failed - 07/20/2022  9:41 AM      Failed - This refill cannot be delegated      Failed - Valid encounter within last 6  months    Recent Outpatient Visits           1 year ago Type 2 diabetes mellitus with diabetic neuropathy, unspecified whether long term insulin use (HCC)   [facility] [place], Priscille Heidelberg, MD   2 years ago Type 2 diabetes mellitus with diabetic neuropathy, unspecified whether long term insulin use (HCC)   [facility] [place], Priscille Heidelberg, MD   2 years ago Gynecologic exam normal   [facility] Cathlean Marseilles A, NP   2 years ago Type 2 diabetes mellitus with other circulatory complication, without long-term current use of insulin (HCC)   [facility] [place], Priscille Heidelberg, MD   2 years ago Injury of left toe, initial encounter   [facility] [place], Velna Hatchet, MD       Future Appointments             In 3 months Kirke Corin, Chelsea Aus, MD [facility] at [facility]

## 2022-07-20 NOTE — Patient Outreach (Signed)
Aging Gracefully Program  07/20/2022  [name] [id number] [id number]   [facility] Evaluation Interviewer made contact with patient. Aging Gracefully 9 month survey completed.     Vanice Sarah Care Management Assistant [phone]

## 2022-07-20 NOTE — Telephone Encounter (Signed)
Prescription Request  07/20/2022  LOV: 06/03/2022  What is the name of the medication or equipment? rosuvastatin (CRESTOR) 40 MG tablet [[id number]]   Have you contacted your pharmacy to request a refill? Yes   Which pharmacy would you like this sent to?  [id number] [place], [place] - [address] [place] [address] Phone: [phone] Fax: [phone]    Patient notified that their request is being sent to the clinical staff for review and that they should receive a response within 2 business days.   Please advise at [facility] [phone]

## 2022-07-21 ENCOUNTER — Other Ambulatory Visit: Payer: Self-pay | Admitting: Family Medicine

## 2022-07-21 ENCOUNTER — Other Ambulatory Visit: Payer: Self-pay | Admitting: Cardiovascular Disease

## 2022-07-21 DIAGNOSIS — J4489 Other specified chronic obstructive pulmonary disease: Secondary | ICD-10-CM

## 2022-07-21 DIAGNOSIS — I739 Peripheral vascular disease, unspecified: Secondary | ICD-10-CM

## 2022-07-21 DIAGNOSIS — M79604 Pain in right leg: Secondary | ICD-10-CM

## 2022-07-21 NOTE — Telephone Encounter (Signed)
Refill request

## 2022-07-21 NOTE — Telephone Encounter (Signed)
Requested Prescriptions  Pending Prescriptions Disp Refills   methocarbamol (ROBAXIN) 500 MG tablet [Pharmacy Med Name: METHOCARBAMOL 500 MG TABLET] 120 tablet 0    Sig: TAKE 1 TABLET BY MOUTH EVERY 6 HOURS AS NEEDED FOR MUSCLE SPASM     Not Delegated - Analgesics:  Muscle Relaxants Failed - 07/21/2022  9:49 AM      Failed - This refill cannot be delegated      Failed - Valid encounter within last 6 months    Recent Outpatient Visits           1 year ago Type 2 diabetes mellitus with diabetic neuropathy, unspecified whether long term insulin use (HCC)   [facility] [place], Priscille Heidelberg, MD   2 years ago Type 2 diabetes mellitus with diabetic neuropathy, unspecified whether long term insulin use (HCC)   [facility] [place], Priscille Heidelberg, MD   2 years ago Gynecologic exam normal   [facility] Cathlean Marseilles A, NP   2 years ago Type 2 diabetes mellitus with other circulatory complication, without long-term current use of insulin (HCC)   [facility] [place], Priscille Heidelberg, MD   2 years ago Injury of left toe, initial encounter   [facility] [place], Velna Hatchet, MD       Future Appointments             In 3 months Kirke Corin, Chelsea Aus, MD [place] [facility] at [facility]             albuterol (VENTOLIN HFA) 108 (90 Base) MCG/ACT inhaler [Pharmacy Med Name: ALBUTEROL HFA (PROAIR) INHALER] 8.5 each 0    Sig: TAKE 2 PUFFS BY MOUTH EVERY 6 HOURS AS NEEDED FOR WHEEZE OR SHORTNESS OF BREATH     Pulmonology:  Beta Agonists 2 Failed - 07/21/2022  9:49 AM      Failed - Valid encounter within last 12 months    Recent Outpatient Visits           1 year ago Type 2 diabetes mellitus with diabetic neuropathy, unspecified whether long term insulin use (HCC)   [name] [place], Priscille Heidelberg, MD   2 years ago Type 2 diabetes mellitus with diabetic neuropathy, unspecified whether long term insulin use  (HCC)   [facility] [place], Priscille Heidelberg, MD   2 years ago Gynecologic exam normal   [facility] Cathlean Marseilles A, NP   2 years ago Type 2 diabetes mellitus with other circulatory complication, without long-term current use of insulin (HCC)   [facility] [place], Priscille Heidelberg, MD   2 years ago Injury of left toe, initial encounter   [facility] [place], Velna Hatchet, MD       Future Appointments             In 3 months Iran Ouch, MD [facility] at [facility] - Last BP in normal range    BP Readings from Last 1 Encounters:  07/19/22 128/76         Passed - Last Heart Rate in normal range    Pulse Readings from Last 1 Encounters:  07/19/22 84

## 2022-07-22 NOTE — Telephone Encounter (Signed)
Patient called to request status update on refill request for   methocarbamol (ROBAXIN) 500 MG tablet [[id number]]  Patient stated other refill requests were sent by the pharmacy (5 in total). Names of other meds requested. Patient stated she isn't sure of their names.  Requesting call back. Please advise at [phone].

## 2022-07-25 ENCOUNTER — Telehealth: Payer: Self-pay | Admitting: Cardiovascular Disease

## 2022-07-25 NOTE — Telephone Encounter (Signed)
Stop cilostazol

## 2022-07-25 NOTE — Telephone Encounter (Signed)
Pt c/o medication issue:  1. Name of Medication:   cilostazol (PLETAL) 50 MG tablet    2. How are you currently taking this medication (dosage and times per day)?   Take 1 tablet (50 mg total) by mouth 2 (two) times daily.    3. Are you having a reaction (difficulty breathing--STAT)? no  4. What is your medication issue? The pills that was given to her made her sick to her stomach and also gave her a headache. She has stop taking medication.

## 2022-07-26 ENCOUNTER — Other Ambulatory Visit: Payer: Self-pay | Admitting: Family Medicine

## 2022-07-26 ENCOUNTER — Telehealth: Payer: Self-pay

## 2022-07-26 ENCOUNTER — Other Ambulatory Visit: Payer: Self-pay

## 2022-07-26 DIAGNOSIS — M79604 Pain in right leg: Secondary | ICD-10-CM

## 2022-07-26 DIAGNOSIS — I739 Peripheral vascular disease, unspecified: Secondary | ICD-10-CM

## 2022-07-26 MED ORDER — HYDROCODONE-ACETAMINOPHEN 5-325 MG PO TABS: 1.0000 | ORAL_TABLET | Freq: Four times a day (QID) | ORAL | 0 refills | Status: DC | PRN

## 2022-07-26 NOTE — Telephone Encounter (Signed)
Prescription Request  07/26/2022  LOV: 06/03/22 CPE  What is the name of the medication or equipment? methocarbamol (ROBAXIN) 500 MG tablet [[id number]]  Have you contacted your pharmacy to request a refill? Yes   Which pharmacy would you like this sent to?  [id number] [place], [place] - [address] [place] [address] Phone: [phone] Fax: [phone]    Patient notified that their request is being sent to the clinical staff for review and that they should receive a response within 2 business days.   Please advise at [facility] [phone]

## 2022-07-26 NOTE — Telephone Encounter (Signed)
Pt was returning nurse call and is requesting a callback. Please advise. 

## 2022-07-26 NOTE — Telephone Encounter (Signed)
Left a message for the patient to call back.  

## 2022-07-26 NOTE — Telephone Encounter (Signed)
Pt made aware and inquiring as to what she needs to do going forward for leg pain.  Will forward to MD for recommendations.

## 2022-07-27 NOTE — Telephone Encounter (Signed)
Requested medication (s) are due for refill today - yes  Requested medication (s) are on the active medication list -yes  Future visit scheduled -no  Last refill: 06/23/22 #120  Notes to clinic: non delegated Rx  Requested Prescriptions  Pending Prescriptions Disp Refills   methocarbamol (ROBAXIN) 500 MG tablet [Pharmacy Med Name: METHOCARBAMOL 500 MG TABLET] 120 tablet 0    Sig: TAKE 1 TABLET BY MOUTH EVERY 6 HOURS AS NEEDED FOR MUSCLE SPASM     Not Delegated - Analgesics:  Muscle Relaxants Failed - 07/26/2022  3:04 PM      Failed - This refill cannot be delegated      Failed - Valid encounter within last 6 months    Recent Outpatient Visits           1 year ago Type 2 diabetes mellitus with diabetic neuropathy, unspecified whether long term insulin use (HCC)   [facility] [place], Priscille Heidelberg, MD   2 years ago Type 2 diabetes mellitus with diabetic neuropathy, unspecified whether long term insulin use (HCC)   [facility] [place], Priscille Heidelberg, MD   2 years ago Gynecologic exam normal   [facility] Cathlean Marseilles A, NP   2 years ago Type 2 diabetes mellitus with other circulatory complication, without long-term current use of insulin (HCC)   [facility] [place], Priscille Heidelberg, MD   2 years ago Injury of left toe, initial encounter   [facility] [place], Velna Hatchet, MD       Future Appointments             In 3 months Kirke Corin, Chelsea Aus, MD [place] [facility] at [facility]               Requested Prescriptions  Pending Prescriptions Disp Refills   methocarbamol (ROBAXIN) 500 MG tablet [Pharmacy Med Name: METHOCARBAMOL 500 MG TABLET] 120 tablet 0    Sig: TAKE 1 TABLET BY MOUTH EVERY 6 HOURS AS NEEDED FOR MUSCLE SPASM     Not Delegated - Analgesics:  Muscle Relaxants Failed - 07/26/2022  3:04 PM      Failed - This refill cannot be delegated      Failed - Valid encounter within last 6  months    Recent Outpatient Visits           1 year ago Type 2 diabetes mellitus with diabetic neuropathy, unspecified whether long term insulin use (HCC)   [facility] [place], Priscille Heidelberg, MD   2 years ago Type 2 diabetes mellitus with diabetic neuropathy, unspecified whether long term insulin use (HCC)   [facility] [place], Priscille Heidelberg, MD   2 years ago Gynecologic exam normal   [facility] Cathlean Marseilles A, NP   2 years ago Type 2 diabetes mellitus with other circulatory complication, without long-term current use of insulin (HCC)   [facility] [place], Priscille Heidelberg, MD   2 years ago Injury of left toe, initial encounter   [facility] [place], Velna Hatchet, MD       Future Appointments             In 3 months Kirke Corin, Chelsea Aus, MD [facility] at [facility]

## 2022-07-27 NOTE — Telephone Encounter (Signed)
Pt is returning call.  

## 2022-07-27 NOTE — Telephone Encounter (Signed)
She should continue with a walking exercise program as instructed and see if there is gradual improvement.  If she does not improve, we will plan on proceeding with angiography in few months.

## 2022-07-27 NOTE — Telephone Encounter (Signed)
Patient has been made aware and will keep [place] updated.

## 2022-07-27 NOTE — Telephone Encounter (Signed)
Left a message for the patient to call back.  

## 2022-08-03 ENCOUNTER — Other Ambulatory Visit: Payer: Self-pay | Admitting: Family Medicine

## 2022-08-04 ENCOUNTER — Telehealth: Payer: Self-pay

## 2022-08-04 ENCOUNTER — Other Ambulatory Visit: Payer: Self-pay

## 2022-08-04 DIAGNOSIS — E1159 Type 2 diabetes mellitus with other circulatory complications: Secondary | ICD-10-CM

## 2022-08-04 DIAGNOSIS — I10 Essential (primary) hypertension: Secondary | ICD-10-CM

## 2022-08-04 DIAGNOSIS — I701 Atherosclerosis of renal artery: Secondary | ICD-10-CM

## 2022-08-04 MED ORDER — BISOPROLOL FUMARATE 5 MG PO TABS
ORAL_TABLET | ORAL | 3 refills | Status: DC
Start: 2022-08-04 — End: 2023-07-21

## 2022-08-04 NOTE — Telephone Encounter (Signed)
Prescription Request  08/04/2022  LOV: 06/03/22 cpe  What is the name of the medication or equipment? bisoprolol (ZEBETA) 5 MG tablet [[id number]]   Have you contacted your pharmacy to request a refill? Yes   Which pharmacy would you like this sent to?  [id number] [place], [place] - [address] [place] [address] Phone: [phone] Fax: [phone]    Patient notified that their request is being sent to the clinical staff for review and that they should receive a response within 2 business days.   Please advise at [facility] [phone]  Prescription Request  08/04/2022  LOV: 06/03/22 cpe  What is the name of the medication or equipment? rosuvastatin (CRESTOR) 40 MG tablet [[id number]]  Have you contacted your pharmacy to request a refill? Yes   Which pharmacy would you like this sent to?  [id number] [place], [place] - [address] [place] [address] Phone: [phone] Fax: [phone]    Patient notified that their request is being sent to the clinical staff for review and that they should receive a response within 2 business days.   Please advise at [facility] [phone]

## 2022-08-08 ENCOUNTER — Other Ambulatory Visit: Payer: Self-pay

## 2022-08-08 DIAGNOSIS — I25118 Atherosclerotic heart disease of native coronary artery with other forms of angina pectoris: Secondary | ICD-10-CM

## 2022-08-08 DIAGNOSIS — E1159 Type 2 diabetes mellitus with other circulatory complications: Secondary | ICD-10-CM

## 2022-08-08 MED ORDER — ROSUVASTATIN CALCIUM 40 MG PO TABS
ORAL_TABLET | ORAL | 1 refills | Status: DC
Start: 2022-08-08 — End: 2022-11-07

## 2022-08-08 MED ORDER — PIOGLITAZONE HCL 30 MG PO TABS
ORAL_TABLET | ORAL | 1 refills | Status: AC
Start: 2022-08-08 — End: ?

## 2022-08-15 ENCOUNTER — Other Ambulatory Visit: Payer: Self-pay | Admitting: Family Medicine

## 2022-08-15 NOTE — Telephone Encounter (Signed)
Prescription Request  08/15/2022  LOV: 06/03/2022  What is the name of the medication or equipment?   HYDROcodone-acetaminophen (NORCO/VICODIN) 5-325 MG tablet   Have you contacted your pharmacy to request a refill? Yes   Which pharmacy would you like this sent to?  [id number] [place], [place] - [address] AT [address] [place] [address] Phone: [phone] Fax: [phone]    Patient notified that their request is being sent to the clinical staff for review and that they should receive a response within 2 business days.   Please advise patient when refill sent at [phone].

## 2022-08-16 MED ORDER — HYDROCODONE-ACETAMINOPHEN 5-325 MG PO TABS: 1.0000 | ORAL_TABLET | Freq: Four times a day (QID) | ORAL | 0 refills | Status: DC | PRN

## 2022-08-21 ENCOUNTER — Other Ambulatory Visit: Payer: Self-pay | Admitting: Family Medicine

## 2022-08-22 NOTE — Telephone Encounter (Signed)
Requested medications are due for refill today.  yes  Requested medications are on the active medications list.  yes  Last refill. 07/30/2021 #240 11 rf  Future visit scheduled.   no  Notes to clinic.  Medication not assigned to a protocol. Please review for refill.    Requested Prescriptions  Pending Prescriptions Disp Refills   lipase/protease/amylase (CREON) 36000 UNITS CPEP capsule 240 capsule 11    Sig: Take 2 capsules (72,000 Units total) by mouth 3 (three) times daily with meals. May also take 1 capsule (36,000 Units total) as needed (with snacks).     Off-Protocol Failed - 08/21/2022  7:11 PM      Failed - Medication not assigned to a protocol, review manually.      Failed - Valid encounter within last 12 months    Recent Outpatient Visits           1 year ago Type 2 diabetes mellitus with diabetic neuropathy, unspecified whether long term insulin use (HCC)   [facility] Donita Brooks, MD   2 years ago Type 2 diabetes mellitus with diabetic neuropathy, unspecified whether long term insulin use (HCC)   [facility] [place], Priscille Heidelberg, MD   2 years ago Gynecologic exam normal   [facility] Cathlean Marseilles A, NP   2 years ago Type 2 diabetes mellitus with other circulatory complication, without long-term current use of insulin (HCC)   [facility] [place], Priscille Heidelberg, MD   2 years ago Injury of left toe, initial encounter   [facility] [place], Velna Hatchet, MD       Future Appointments             In 2 months Kirke Corin, Chelsea Aus, MD [facility] at [facility]

## 2022-08-23 ENCOUNTER — Other Ambulatory Visit: Payer: Self-pay | Admitting: Family Medicine

## 2022-08-23 ENCOUNTER — Other Ambulatory Visit (HOSPITAL_COMMUNITY): Payer: Self-pay

## 2022-08-23 DIAGNOSIS — I739 Peripheral vascular disease, unspecified: Secondary | ICD-10-CM

## 2022-08-23 DIAGNOSIS — M79604 Pain in right leg: Secondary | ICD-10-CM

## 2022-08-23 MED ORDER — PANCRELIPASE (LIP-PROT-AMYL) 36000-114000 UNITS PO CPEP
ORAL_CAPSULE | ORAL | 0 refills | Status: DC
  Filled 2022-08-23: qty 200, 25d supply, fill #0

## 2022-08-24 ENCOUNTER — Telehealth: Payer: Self-pay | Admitting: Cardiovascular Disease

## 2022-08-24 NOTE — Telephone Encounter (Signed)
Call placed back to the patient. She stated that she has been having feelings of numbness in both of her legs that feels like they have fallen asleep. This happens when she is ambulating and at rest.  She has also has worsening pain when ambulating. This has been progressing for the past two weeks. She denies discoloration and a temperature change to her legs.   The patient stated that she is willing to proceed with a procedure.   She stated that she cannot come in tomorrow for an appointment because she cares for her husband and he has appointments tomorrow.   She has been advised to go to the ED if the pain and numbness progress but declined that as well.

## 2022-08-24 NOTE — Telephone Encounter (Signed)
Called patient back to discuss symptoms. Patient last seen 07/19/22 by Dr. Kirke Corin. 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 She was started on pletal at her last visit (50 mg BID) and advised to start walking program.  Today patient reports that she is having worsening bilateral leg pain, right greater than left. She reports numbness and tingling as well as pain. She states her legs are chronically swollen, left greater that right. She reports that she has not been able to walk as much due to worsening pain and the pain is even waking her up at night. She reports that she tried the pletal for about a week but then stopped due to headache. She reports her limbs are chronically "on the cool side" and currently notices no difference in temperature between left and right.   Spoke with Dr. Herbie Baltimore DOD and Faustino Congress RN as Dr. Kirke Corin is in procedures today. Forwarding to Faustino Congress so patient can be advised of plan once Dr. Kirke Corin is contacted.

## 2022-08-24 NOTE — Telephone Encounter (Signed)
Patient states from the knee down her legs are numb, but also tingling and in pain. No discoloration and minimal swelling. Pain is mainly in right leg from the calf down.

## 2022-08-25 NOTE — Telephone Encounter (Signed)
I can see her on Tuesday

## 2022-08-25 NOTE — Telephone Encounter (Signed)
Appointment made for 7/16 with Dr. Kirke Corin. She has been advised to call back for worsening symptoms and go to the ED.

## 2022-08-30 ENCOUNTER — Ambulatory Visit: Payer: HMO | Attending: Cardiovascular Disease | Admitting: Cardiovascular Disease

## 2022-08-30 ENCOUNTER — Encounter: Payer: Self-pay | Admitting: Cardiovascular Disease

## 2022-08-30 ENCOUNTER — Telehealth: Payer: Self-pay | Admitting: Cardiovascular Disease

## 2022-08-30 VITALS — BP 144/60 | HR 93 | Ht 62.5 in | Wt 202.4 lb

## 2022-08-30 DIAGNOSIS — I25118 Atherosclerotic heart disease of native coronary artery with other forms of angina pectoris: Secondary | ICD-10-CM | POA: Diagnosis not present

## 2022-08-30 DIAGNOSIS — I739 Peripheral vascular disease, unspecified: Secondary | ICD-10-CM | POA: Diagnosis not present

## 2022-08-30 DIAGNOSIS — E785 Hyperlipidemia, unspecified: Secondary | ICD-10-CM

## 2022-08-30 NOTE — Telephone Encounter (Signed)
Patient states during her appointment this morning she thinks her husband left a Environmental manager back stimulator in the office. She states it looks like a cell phone. Please advise if found.

## 2022-08-30 NOTE — Progress Notes (Signed)
Cardiology Office Note   Date:  08/30/2022   ID:  [name], DOB 12-27-1955, MRN [id number]  PCP:  Donita Brooks, MD  Cardiologist:  Jessy Oto chief complaint on file.      History of Present Illness: [name] is a 67 y.o. female who presents for a follow up visit regarding regarding peripheral arterial disease and coronary artery disease. She has known history of peripheral arterial disease status post atherectomy of the right SFA in 2011 by Dr. Sanjuana Kava. She has other chronic medical conditions including CAD, DM, HTN, chronic back pain, recurrent idiopathic pancreatitis and hyperlipidemia. She is a former smoker.  She is s/p bilateral TKR.   She had worsening right leg claudication in April 2017. Angiography showed no significant aortoiliac disease. There was moderate right common femoral artery stenosis and severe discrete stenosis in the proximal right SFA with three-vessel runoff below the knee. There was no significant obstructive disease involving the left lower extremity. A self-expanding stent was placed to the proximal right SFA. She had previous bilateral knee replacement and chronic bilateral leg pain.   She had worsening left leg claudication in 2021.  Vascular studies showed mildly reduced ABI bilaterally in the 0.8 range.  Duplex showed moderate right common femoral artery stenosis and moderate SFA disease.  On the left, there was severe new stenosis in the proximal SFA. Angiography was done in March of 2021 which showed borderline stenosis affecting the left common iliac artery, severe calcified stenosis affecting the left proximal SFA and three-vessel runoff below the knee.  On the right, there was severe calcified disease affecting the common femoral artery with patent proximal SFA stent followed by 60% disease throughout the whole SFA and three-vessel runoff below the knee.  I performed successful orbital atherectomy and drug-coated balloon  angioplasty to the left SFA.   She underwent staged right common femoral artery endarterectomy by Dr. Chestine Spore in August of 2021.  Most recent arterial Doppler studies in October 2023 showed a drop in ABI on the right side to 0.75 and normal on the left at 0.98.  Duplex showed moderately elevated velocities in the right SFA and popliteal arteries in the 300 range.  No obstructive disease on the left.  She was seen recently for worsening right calf claudication.  She was prescribed cilostazol but she did not tolerate the medication due to GI symptoms and headache.  She stopped the medication. She reports worsening right calf claudication and bilateral leg numbness.  She feels that her legs fall asleep.  Her right calf claudication worsens with minimal activities. No chest pain.   Past Medical History:  Diagnosis Date   Aortic atherosclerosis (HCC)    Asthma    CAD (coronary artery disease)    a. LHC 2011: Diffuse distal and branch vessel disease - patient managed medically, no interventional options. b. Nuc 03/2014 - low risk, no ischemia.   Complication of anesthesia    slow to wake up with last surgery in 2011    Diabetes mellitus with circulatory complication (HCC)    Diverticulosis    Dyslipidemia    GERD (gastroesophageal reflux disease)    Headache    hx of migraines    Hypertension    Osteoarthritis    severe right knee  R TKR   PAD (peripheral artery disease) (HCC)    a. Severe stenosis mid right SFA s/p atherectomy 03/25/09. b. peripheral angiography in 12/2011 which showed only about 50% diffuse right  SFA stenosis.   Pancreatitis 2003   Renal artery stenosis (HCC)    a. LHC 2011 - 40-50% left RAS.   Tobacco use disorder    quit 11/10    Past Surgical History:  Procedure Laterality Date   ABDOMINAL AORTAGRAM N/A 12/21/2011   Procedure: ABDOMINAL AORTAGRAM;  Surgeon: Iran Ouch, MD;  Location: [facility];  Service: Cardiovascular;  Laterality: N/A;   ABDOMINAL  AORTOGRAM W/LOWER EXTREMITY Bilateral 05/08/2019   Procedure: ABDOMINAL AORTOGRAM W/LOWER EXTREMITY;  Surgeon: Iran Ouch, MD;  Location: [facility];  Service: Cardiovascular;  Laterality: Bilateral;   CARDIAC CATHETERIZATION     ENDARTERECTOMY FEMORAL Right 10/10/2019   Procedure: RIGHT FEMORAL ENDARTERECTOMY;  Surgeon: Cephus Shelling, MD;  Location[facility];  Service: Vascular;  Laterality: Right;   ESOPHAGOGASTRODUODENOSCOPY (EGD) WITH PROPOFOL N/A 10/05/2017   Procedure: ESOPHAGOGASTRODUODENOSCOPY (EGD) WITH PROPOFOL;  Surgeon: Rachael Fee, MD;  Location: [facility];  Service: Endoscopy;  Laterality: N/A;   EUS N/A 10/05/2017   Procedure: UPPER ENDOSCOPIC ULTRASOUND (EUS) RADIAL;  Surgeon: Rachael Fee, MD;  Location: [facility];  Service: Endoscopy;  Laterality: N/A;   FOOT SURGERY Left    LOWER EXTREMITY ANGIOGRAM Bilateral 05/27/2015   Procedure: Lower Extremity Angiogram;  Surgeon: Iran Ouch, MD;  Location: [facility];  Service: Cardiovascular;  Laterality: Bilateral;   PATCH ANGIOPLASTY Right 10/10/2019   Procedure: PATCH ANGIOPLASTY USING Livia Snellen BIOLOGIC PATCH;  Surgeon: Cephus Shelling, MD;  Location: [facility] OR;  Service: Vascular;  Laterality: Right;   PERIPHERAL VASCULAR ATHERECTOMY Left 05/08/2019   Procedure: PERIPHERAL VASCULAR ATHERECTOMY;  Surgeon: Iran Ouch, MD;  Location: [facility];  Service: Cardiovascular;  Laterality: Left;   PERIPHERAL VASCULAR CATHETERIZATION N/A 05/27/2015   Procedure: Abdominal Aortogram;  Surgeon: Iran Ouch, MD;  Location: [facility];  Service: Cardiovascular;  Laterality: N/A;   PERIPHERAL VASCULAR CATHETERIZATION Right 05/27/2015   Procedure: Peripheral Vascular Intervention;  Surgeon: Iran Ouch, MD;  Location: [facility];  Service: Cardiovascular;  Laterality: Right;  SFA   TOTAL KNEE ARTHROPLASTY Right 2011   TOTAL KNEE ARTHROPLASTY Left 05/22/2014   Procedure:  LEFT TOTAL KNEE ARTHROPLASTY;  Surgeon: Ranee Gosselin, MD;  Location: [facility]S;  Service: Orthopedics;  Laterality: Left;   TRIGGER FINGER RELEASE Left 07/07/2021   Procedure: LEFT INDEX FINGER RELEASE TRIGGER FINGER/A-1 PULLEY / CARPOMETACARPAL INJECTION;  Surgeon: Marlyne Beards, MD;  Location: [facility];  Service: Orthopedics;  Laterality: Left;   TUBAL LIGATION  1980     Current Outpatient Medications  Medication Sig Dispense Refill   acetaminophen (TYLENOL) 500 MG tablet Take 1,000 mg by mouth every 6 (six) hours as needed for moderate pain or headache.     albuterol (VENTOLIN HFA) 108 (90 Base) MCG/ACT inhaler TAKE 2 PUFFS BY MOUTH EVERY 6 HOURS AS NEEDED FOR WHEEZE OR SHORTNESS OF BREATH 8.5 each 0   Biotin w/ Vitamins C & E (HAIR/SKIN/NAILS PO) Take 1 tablet by mouth daily.     bisoprolol (ZEBETA) 5 MG tablet TAKE 1/2 TABLET(2.5 MG) BY MOUTH DAILY 45 tablet 3   clopidogrel (PLAVIX) 75 MG tablet TAKE 1 TABLET(75 MG) BY MOUTH DAILY 90 tablet 1   dapagliflozin propanediol (FARXIGA) 10 MG TABS tablet TAKE 1 TABLET BY MOUTH DAILY BEFORE BREAKFAST. 90 tablet 1   diclofenac sodium (VOLTAREN) 1 % GEL Apply 2 g topically 4 (four) times daily. (Patient taking differently: Apply 2 g  topically 4 (four) times daily as needed (for hand pain).) 100 g 0   fluticasone-salmeterol (ADVAIR DISKUS) 250-50 MCG/ACT AEPB Inhale 1 puff into the lungs in the morning and at bedtime. 60 each 11   HYDROcodone-acetaminophen (NORCO/VICODIN) 5-325 MG tablet Take 1 tablet by mouth every 6 (six) hours as needed for moderate pain. Chronic Pain. Dx: G89.4 Patient needs to schedule office visit 130 tablet 0   isosorbide mononitrate (IMDUR) 60 MG 24 hr tablet TAKE 1 TABLET BY MOUTH EVERY DAY 90 tablet 1   lipase/protease/amylase (CREON) 36000 UNITS CPEP capsule Take 2 capsules (72,000 Units total) by mouth 3 (three) times daily with meals. May also take 1 capsule (36,000 Units total) as needed (with snacks).  200 capsule 0   Loratadine (CLARITIN PO) Take 1 tablet by mouth daily.     metFORMIN (GLUCOPHAGE) 1000 MG tablet Take 1 tablet (1,000 mg total) by mouth 2 (two) times daily with a meal. 180 tablet 3   methocarbamol (ROBAXIN) 500 MG tablet TAKE 1 TABLET BY MOUTH EVERY 6 HOURS AS NEEDED FOR MUSCLE SPASM 120 tablet 1   methylPREDNISolone (MEDROL DOSEPAK) 4 MG TBPK tablet follow package directions 21 tablet 0   Multiple Vitamin (MULTIVITAMIN) tablet Take 1 tablet by mouth daily.     nitroGLYCERIN (NITROSTAT) 0.4 MG SL tablet Place 1 tablet under the tongue every 5 (five) minutes as needed for chest pain.     ONETOUCH ULTRA TEST test strip SMARTSIG:Via Meter 1-3 Times Daily     pantoprazole (PROTONIX) 40 MG tablet Take 1 tablet (40 mg total) by mouth daily. 90 tablet 3   pioglitazone (ACTOS) 30 MG tablet TAKE 1 TABLET(30 MG) BY MOUTH DAILY 90 tablet 1   pregabalin (LYRICA) 50 MG capsule Take 1 capsule (50 mg total) by mouth 3 (three) times daily. 90 capsule 3   Probiotic Product (TRUBIOTICS PO) Take 1 capsule by mouth daily.     promethazine (PHENERGAN) 25 MG tablet TAKE 1 TABLET(25 MG) BY MOUTH EVERY 8 HOURS AS NEEDED FOR NAUSEA OR VOMITING 30 tablet 0   rosuvastatin (CRESTOR) 40 MG tablet TAKE 1 TABLET(40 MG) BY MOUTH EVERY NIGHT 90 tablet 1   sitaGLIPtin (JANUVIA) 100 MG tablet TAKE 1 TABLET BY MOUTH EVERY DAY 30 tablet 2   vitamin B-12 (CYANOCOBALAMIN) 1000 MCG tablet Take 1,000 mcg by mouth daily.     Current Facility-Administered Medications  Medication Dose Route Frequency Provider Last Rate Last Admin   triamcinolone acetonide (KENALOG) 10 MG/ML injection 10 mg  10 mg Other Once Regal, Norman S, DPM        Allergies:   Gabapentin, Glucotrol [glipizide], Influenza vaccines, Latex, Nortriptyline, Tetanus toxoid, Other, Prednisone & diphenhydramine, and Invokana [canagliflozin]    Social History:  The patient  reports that she quit smoking about 13 years ago. Her smoking use included  cigarettes. She has never used smokeless tobacco. She reports that she does not drink alcohol and does not use drugs.   Family History:  The patient's family history includes Breast cancer (age of onset: 47) in her sister; CVA in her mother; Emphysema in her father.    ROS:  Please see the history of present illness.   Otherwise, review of systems are positive for .   All other systems are reviewed and negative.    PHYSICAL EXAM: VS:  BP (!) 144/60 (BP Location: Left Arm, Patient Position: Sitting, Cuff Size: Normal)   Pulse 93   Ht 5' 2.5" (1.588 m)   [employer]  202 lb 6.4 oz (91.8 kg)   SpO2 97%   BMI 36.43 kg/m  , BMI Body mass index is 36.43 kg/m. GEN: Well nourished, well developed, in no acute distress  HEENT: normal  Neck: no JVD, carotid bruits, or masses Cardiac: RRR; no murmurs, rubs, or gallops,no edema  Respiratory:  clear to auscultation bilaterally, normal work of breathing GI: soft, nontender, nondistended, + BS MS: no deformity or atrophy  Skin: warm and dry, no rash Neuro:  Strength and sensation are intact Psych: euthymic mood, full affect Vascular: Femoral pulses: Normal bilaterally.  Distal pulses are normal on the left and diminished on the right  EKG:  EKG is not ordered today.    Recent Labs: 06/03/2022: ALT 17; BUN 20; Creat 1.07; Hemoglobin 11.6; Platelets 224; Potassium 4.6; Sodium 141    Lipid Panel    Component Value Date/Time   CHOL 157 06/03/2022 1054   TRIG 277 (H) 06/03/2022 1054   HDL 50 06/03/2022 1054   CHOLHDL 3.1 06/03/2022 1054   VLDL 35 10/11/2019 0219   LDLCALC 71 06/03/2022 1054   LDLDIRECT 50.6 01/02/2014 1003      Wt Readings from Last 3 Encounters:  08/30/22 202 lb 6.4 oz (91.8 kg)  07/19/22 201 lb 12.8 oz (91.5 kg)  06/03/22 200 lb (90.7 kg)       ASSESSMENT AND PLAN:  1.  PAD with severe lifestyle limiting claudication: Status post left SFA atherectomy and drug-coated balloon angioplasty as well as right common femoral  artery endarterectomy.   She is mostly bothered by severe right calf claudication likely due to diffuse SFA and popliteal artery disease.  She did not tolerate cilostazol and reports worsening symptoms.  I explained to her that the numbness is likely due to peripheral neuropathy and not necessarily due to peripheral arterial disease.  She feels very limited by right calf claudication and given failure of an exercise program and intolerance of cilostazol, I recommend proceeding with abdominal aortogram lower extremity angiography and possible endovascular intervention.  I discussed the procedure in details as well as risk and benefits.  Planned access is via the left common femoral artery.  2. Coronary artery disease involving native coronary arteries with other forms of angina:   Previous cardiac catheterization in 2011 showed distal LAD and small branch disease which is being managed medically.  Most recent Lexiscan Myoview in May 2022 showed evidence of mild anterior wall ischemia.  She has been treated medically given stability of symptoms.  3. Hyperlipidemia:  Continue treatment with high-dose rosuvastatin.  I reviewed recent lipid profile done in April which showed an LDL of 71.  4. Diabetes mellitus: Managed by primary care physician.    Disposition:   Proceed with angiography and follow-up after.  Signed,  Lorine Bears, MD  08/30/2022 1:18 PM    [facility]

## 2022-08-30 NOTE — H&P (View-Only) (Signed)
Cardiology Office Note   Date:  08/30/2022   ID:  [name], DOB 12-27-1955, MRN [id number]  PCP:  Donita Brooks, MD  Cardiologist:  Jessy Oto chief complaint on file.      History of Present Illness: [name] is a 67 y.o. female who presents for a follow up visit regarding regarding peripheral arterial disease and coronary artery disease. She has known history of peripheral arterial disease status post atherectomy of the right SFA in 2011 by Dr. Sanjuana Kava. She has other chronic medical conditions including CAD, DM, HTN, chronic back pain, recurrent idiopathic pancreatitis and hyperlipidemia. She is a former smoker.  She is s/p bilateral TKR.   She had worsening right leg claudication in April 2017. Angiography showed no significant aortoiliac disease. There was moderate right common femoral artery stenosis and severe discrete stenosis in the proximal right SFA with three-vessel runoff below the knee. There was no significant obstructive disease involving the left lower extremity. A self-expanding stent was placed to the proximal right SFA. She had previous bilateral knee replacement and chronic bilateral leg pain.   She had worsening left leg claudication in 2021.  Vascular studies showed mildly reduced ABI bilaterally in the 0.8 range.  Duplex showed moderate right common femoral artery stenosis and moderate SFA disease.  On the left, there was severe new stenosis in the proximal SFA. Angiography was done in March of 2021 which showed borderline stenosis affecting the left common iliac artery, severe calcified stenosis affecting the left proximal SFA and three-vessel runoff below the knee.  On the right, there was severe calcified disease affecting the common femoral artery with patent proximal SFA stent followed by 60% disease throughout the whole SFA and three-vessel runoff below the knee.  I performed successful orbital atherectomy and drug-coated balloon  angioplasty to the left SFA.   She underwent staged right common femoral artery endarterectomy by Dr. Chestine Spore in August of 2021.  Most recent arterial Doppler studies in October 2023 showed a drop in ABI on the right side to 0.75 and normal on the left at 0.98.  Duplex showed moderately elevated velocities in the right SFA and popliteal arteries in the 300 range.  No obstructive disease on the left.  She was seen recently for worsening right calf claudication.  She was prescribed cilostazol but she did not tolerate the medication due to GI symptoms and headache.  She stopped the medication. She reports worsening right calf claudication and bilateral leg numbness.  She feels that her legs fall asleep.  Her right calf claudication worsens with minimal activities. No chest pain.   Past Medical History:  Diagnosis Date   Aortic atherosclerosis (HCC)    Asthma    CAD (coronary artery disease)    a. LHC 2011: Diffuse distal and branch vessel disease - patient managed medically, no interventional options. b. Nuc 03/2014 - low risk, no ischemia.   Complication of anesthesia    slow to wake up with last surgery in 2011    Diabetes mellitus with circulatory complication (HCC)    Diverticulosis    Dyslipidemia    GERD (gastroesophageal reflux disease)    Headache    hx of migraines    Hypertension    Osteoarthritis    severe right knee  R TKR   PAD (peripheral artery disease) (HCC)    a. Severe stenosis mid right SFA s/p atherectomy 03/25/09. b. peripheral angiography in 12/2011 which showed only about 50% diffuse right  SFA stenosis.   Pancreatitis 2003   Renal artery stenosis (HCC)    a. LHC 2011 - 40-50% left RAS.   Tobacco use disorder    quit 11/10    Past Surgical History:  Procedure Laterality Date   ABDOMINAL AORTAGRAM N/A 12/21/2011   Procedure: ABDOMINAL AORTAGRAM;  Surgeon: Iran Ouch, MD;  Location: [facility];  Service: Cardiovascular;  Laterality: N/A;   ABDOMINAL  AORTOGRAM W/LOWER EXTREMITY Bilateral 05/08/2019   Procedure: ABDOMINAL AORTOGRAM W/LOWER EXTREMITY;  Surgeon: Iran Ouch, MD;  Location: [facility];  Service: Cardiovascular;  Laterality: Bilateral;   CARDIAC CATHETERIZATION     ENDARTERECTOMY FEMORAL Right 10/10/2019   Procedure: RIGHT FEMORAL ENDARTERECTOMY;  Surgeon: Cephus Shelling, MD;  [facility];  Service: Vascular;  Laterality: Right;   ESOPHAGOGASTRODUODENOSCOPY (EGD) WITH PROPOFOL N/A 10/05/2017   Procedure: ESOPHAGOGASTRODUODENOSCOPY (EGD) WITH PROPOFOL;  Surgeon: Rachael Fee, MD;  Location: [facility];  Service: Endoscopy;  Laterality: N/A;   EUS N/A 10/05/2017   Procedure: UPPER ENDOSCOPIC ULTRASOUND (EUS) RADIAL;  Surgeon: Rachael Fee, MD;  Location: [facility];  Service: Endoscopy;  Laterality: N/A;   FOOT SURGERY Left    LOWER EXTREMITY ANGIOGRAM Bilateral 05/27/2015   Procedure: Lower Extremity Angiogram;  Surgeon: Iran Ouch, MD;  [facility];  Service: Cardiovascular;  Laterality: Bilateral;   PATCH ANGIOPLASTY Right 10/10/2019   Procedure: PATCH ANGIOPLASTY USING Livia Snellen BIOLOGIC PATCH;  Surgeon: Cephus Shelling, MD;  Location: [facility] OR;  Service: Vascular;  Laterality: Right;   PERIPHERAL VASCULAR ATHERECTOMY Left 05/08/2019   Procedure: PERIPHERAL VASCULAR ATHERECTOMY;  Surgeon: Iran Ouch, MD;  [facility];  Service: Cardiovascular;  Laterality: Left;   PERIPHERAL VASCULAR CATHETERIZATION N/A 05/27/2015   Procedure: Abdominal Aortogram;  Surgeon: Iran Ouch, MD;  [facility];  Service: Cardiovascular;  Laterality: N/A;   PERIPHERAL VASCULAR CATHETERIZATION Right 05/27/2015   Procedure: Peripheral Vascular Intervention;  Surgeon: Iran Ouch, MD;  [facility];  Service: Cardiovascular;  Laterality: Right;  SFA   TOTAL KNEE ARTHROPLASTY Right 2011   TOTAL KNEE ARTHROPLASTY Left 05/22/2014   Procedure:  LEFT TOTAL KNEE ARTHROPLASTY;  Surgeon: Ranee Gosselin, MD;  Location: [facility] ORS;  Service: Orthopedics;  Laterality: Left;   TRIGGER FINGER RELEASE Left 07/07/2021   Procedure: LEFT INDEX FINGER RELEASE TRIGGER FINGER/A-1 PULLEY / CARPOMETACARPAL INJECTION;  Surgeon: Marlyne Beards, MD;  Location: [facility];  Service: Orthopedics;  Laterality: Left;   TUBAL LIGATION  1980     Current Outpatient Medications  Medication Sig Dispense Refill   acetaminophen (TYLENOL) 500 MG tablet Take 1,000 mg by mouth every 6 (six) hours as needed for moderate pain or headache.     albuterol (VENTOLIN HFA) 108 (90 Base) MCG/ACT inhaler TAKE 2 PUFFS BY MOUTH EVERY 6 HOURS AS NEEDED FOR WHEEZE OR SHORTNESS OF BREATH 8.5 each 0   Biotin w/ Vitamins C & E (HAIR/SKIN/NAILS PO) Take 1 tablet by mouth daily.     bisoprolol (ZEBETA) 5 MG tablet TAKE 1/2 TABLET(2.5 MG) BY MOUTH DAILY 45 tablet 3   clopidogrel (PLAVIX) 75 MG tablet TAKE 1 TABLET(75 MG) BY MOUTH DAILY 90 tablet 1   dapagliflozin propanediol (FARXIGA) 10 MG TABS tablet TAKE 1 TABLET BY MOUTH DAILY BEFORE BREAKFAST. 90 tablet 1   diclofenac sodium (VOLTAREN) 1 % GEL Apply 2 g topically 4 (four) times daily. (Patient taking differently: Apply 2 g  topically 4 (four) times daily as needed (for hand pain).) 100 g 0   fluticasone-salmeterol (ADVAIR DISKUS) 250-50 MCG/ACT AEPB Inhale 1 puff into the lungs in the morning and at bedtime. 60 each 11   HYDROcodone-acetaminophen (NORCO/VICODIN) 5-325 MG tablet Take 1 tablet by mouth every 6 (six) hours as needed for moderate pain. Chronic Pain. Dx: G89.4 Patient needs to schedule office visit 130 tablet 0   isosorbide mononitrate (IMDUR) 60 MG 24 hr tablet TAKE 1 TABLET BY MOUTH EVERY DAY 90 tablet 1   lipase/protease/amylase (CREON) 36000 UNITS CPEP capsule Take 2 capsules (72,000 Units total) by mouth 3 (three) times daily with meals. May also take 1 capsule (36,000 Units total) as needed (with snacks).  200 capsule 0   Loratadine (CLARITIN PO) Take 1 tablet by mouth daily.     metFORMIN (GLUCOPHAGE) 1000 MG tablet Take 1 tablet (1,000 mg total) by mouth 2 (two) times daily with a meal. 180 tablet 3   methocarbamol (ROBAXIN) 500 MG tablet TAKE 1 TABLET BY MOUTH EVERY 6 HOURS AS NEEDED FOR MUSCLE SPASM 120 tablet 1   methylPREDNISolone (MEDROL DOSEPAK) 4 MG TBPK tablet follow package directions 21 tablet 0   Multiple Vitamin (MULTIVITAMIN) tablet Take 1 tablet by mouth daily.     nitroGLYCERIN (NITROSTAT) 0.4 MG SL tablet Place 1 tablet under the tongue every 5 (five) minutes as needed for chest pain.     ONETOUCH ULTRA TEST test strip SMARTSIG:Via Meter 1-3 Times Daily     pantoprazole (PROTONIX) 40 MG tablet Take 1 tablet (40 mg total) by mouth daily. 90 tablet 3   pioglitazone (ACTOS) 30 MG tablet TAKE 1 TABLET(30 MG) BY MOUTH DAILY 90 tablet 1   pregabalin (LYRICA) 50 MG capsule Take 1 capsule (50 mg total) by mouth 3 (three) times daily. 90 capsule 3   Probiotic Product (TRUBIOTICS PO) Take 1 capsule by mouth daily.     promethazine (PHENERGAN) 25 MG tablet TAKE 1 TABLET(25 MG) BY MOUTH EVERY 8 HOURS AS NEEDED FOR NAUSEA OR VOMITING 30 tablet 0   rosuvastatin (CRESTOR) 40 MG tablet TAKE 1 TABLET(40 MG) BY MOUTH EVERY NIGHT 90 tablet 1   sitaGLIPtin (JANUVIA) 100 MG tablet TAKE 1 TABLET BY MOUTH EVERY DAY 30 tablet 2   vitamin B-12 (CYANOCOBALAMIN) 1000 MCG tablet Take 1,000 mcg by mouth daily.     Current Facility-Administered Medications  Medication Dose Route Frequency Provider Last Rate Last Admin   triamcinolone acetonide (KENALOG) 10 MG/ML injection 10 mg  10 mg Other Once Regal, Norman S, DPM        Allergies:   Gabapentin, Glucotrol [glipizide], Influenza vaccines, Latex, Nortriptyline, Tetanus toxoid, Other, Prednisone & diphenhydramine, and Invokana [canagliflozin]    Social History:  The patient  reports that she quit smoking about 13 years ago. Her smoking use included  cigarettes. She has never used smokeless tobacco. She reports that she does not drink alcohol and does not use drugs.   Family History:  The patient's family history includes Breast cancer (age of onset: 47) in her sister; CVA in her mother; Emphysema in her father.    ROS:  Please see the history of present illness.   Otherwise, review of systems are positive for .   All other systems are reviewed and negative.    PHYSICAL EXAM: VS:  BP (!) 144/60 (BP Location: Left Arm, Patient Position: Sitting, Cuff Size: Normal)   Pulse 93   Ht 5' 2.5" (1.588 m)   [employer]  202 lb 6.4 oz (91.8 kg)   SpO2 97%   BMI 36.43 kg/m  , BMI Body mass index is 36.43 kg/m. GEN: Well nourished, well developed, in no acute distress  HEENT: normal  Neck: no JVD, carotid bruits, or masses Cardiac: RRR; no murmurs, rubs, or gallops,no edema  Respiratory:  clear to auscultation bilaterally, normal work of breathing GI: soft, nontender, nondistended, + BS MS: no deformity or atrophy  Skin: warm and dry, no rash Neuro:  Strength and sensation are intact Psych: euthymic mood, full affect Vascular: Femoral pulses: Normal bilaterally.  Distal pulses are normal on the left and diminished on the right  EKG:  EKG is not ordered today.    Recent Labs: 06/03/2022: ALT 17; BUN 20; Creat 1.07; Hemoglobin 11.6; Platelets 224; Potassium 4.6; Sodium 141    Lipid Panel    Component Value Date/Time   CHOL 157 06/03/2022 1054   TRIG 277 (H) 06/03/2022 1054   HDL 50 06/03/2022 1054   CHOLHDL 3.1 06/03/2022 1054   VLDL 35 10/11/2019 0219   LDLCALC 71 06/03/2022 1054   LDLDIRECT 50.6 01/02/2014 1003      Wt Readings from Last 3 Encounters:  08/30/22 202 lb 6.4 oz (91.8 kg)  07/19/22 201 lb 12.8 oz (91.5 kg)  06/03/22 200 lb (90.7 kg)       ASSESSMENT AND PLAN:  1.  PAD with severe lifestyle limiting claudication: Status post left SFA atherectomy and drug-coated balloon angioplasty as well as right common femoral  artery endarterectomy.   She is mostly bothered by severe right calf claudication likely due to diffuse SFA and popliteal artery disease.  She did not tolerate cilostazol and reports worsening symptoms.  I explained to her that the numbness is likely due to peripheral neuropathy and not necessarily due to peripheral arterial disease.  She feels very limited by right calf claudication and given failure of an exercise program and intolerance of cilostazol, I recommend proceeding with abdominal aortogram lower extremity angiography and possible endovascular intervention.  I discussed the procedure in details as well as risk and benefits.  Planned access is via the left common femoral artery.  2. Coronary artery disease involving native coronary arteries with other forms of angina:   Previous cardiac catheterization in 2011 showed distal LAD and small branch disease which is being managed medically.  Most recent Lexiscan Myoview in May 2022 showed evidence of mild anterior wall ischemia.  She has been treated medically given stability of symptoms.  3. Hyperlipidemia:  Continue treatment with high-dose rosuvastatin.  I reviewed recent lipid profile done in April which showed an LDL of 71.  4. Diabetes mellitus: Managed by primary care physician.    Disposition:   Proceed with angiography and follow-up after.  Signed,  Lorine Bears, MD  08/30/2022 1:18 PM    [facility]

## 2022-08-30 NOTE — Patient Instructions (Addendum)
Medication Instructions:  No changes *If you need a refill on your cardiac medications before your next appointment, please call your pharmacy*   Testing/Procedures: Your physician has requested that you have a peripheral vascular angiogram. This exam is performed at the hospital. During this exam IV contrast is used to look at arterial blood flow. Please review the information sheet given for details.    Follow-Up: At [facility], you and your health needs are our priority.  As part of our continuing mission to provide you with exceptional heart care, we have created designated Provider Care Teams.  These Care Teams include your primary Cardiologist (physician) and Advanced Practice Providers (APPs -  Physician Assistants and Nurse Practitioners) who all work together to provide you with the care you need, when you need it.  We recommend signing up for the patient portal called "MyChart".  Sign up information is provided on this After Visit Summary.  MyChart is used to connect with patients for Virtual Visits (Telemedicine).  Patients are able to view lab/test results, encounter notes, upcoming appointments, etc.  Non-urgent messages can be sent to your provider as well.   To learn more about what you can do with MyChart, go to [email or web address].    Your next appointment:   Keep your appointment in September Other Instructions  [facility] A DEPT OF [place]. [facility] AT [facility] [id number] [place] [address] Dept: [phone] Loc: [phone]  [name]  08/30/2022  You are scheduled for a Peripheral Angiogram on Wednesday, August 14 with Dr. Lorine Bears.  1. Please arrive at the [facility] (Main Entrance A) at [facility]: [address] [place], [place] [address] at 6:30 AM (This time is 2 hour(s) before your procedure to ensure your preparation). Free  valet parking service is available. You will check in at ADMITTING. The support person will be asked to wait in the waiting room.  It is OK to have someone drop you off and come back when you are ready to be discharged.    Special note: Every effort is made to have your procedure done on time. Please understand that emergencies sometimes delay scheduled procedures.  2. Diet: Do not eat solid foods after midnight.  The patient may have clear liquids until 5am upon the day of the procedure.  3. Labs: You will need to have blood drawn on 08/30/22. You do not need to be fasting.  4. Medication instructions in preparation for your procedure: Hold all diabetic medication the morning of the procedure  -hold the metformin the morning of and 48 hours after  -hold the Januvia  -hold  the farxiga  On the morning of your procedure, take your Plavix/Clopidogrel and any morning medicines NOT listed above.  You may use sips of water.  5. Plan to go home the same day, you will only stay overnight if medically necessary. 6. Bring a current list of your medications and current insurance cards. 7. You MUST have a responsible person to drive you home. 8. Someone MUST be with you the first 24 hours after you arrive home or your discharge will be delayed. 9. Please wear clothes that are easy to get on and off and wear slip-on shoes.  Thank you for allowing [place] to care for you!   -- [facility]

## 2022-08-30 NOTE — Telephone Encounter (Signed)
I went to look in the rooms on pod C. As I was asking front desk did anyone turn in something that look like a cell phone. Patient walked in lobby and looked around and we did not see anything nor did anyone turn in anything

## 2022-08-31 LAB — BASIC METABOLIC PANEL
BUN/Creatinine Ratio: 23 (ref 12–28)
BUN: 23 mg/dL (ref 8–27)
CO2: 22 mmol/L (ref 20–29)
Calcium: 9.9 mg/dL (ref 8.7–10.3)
Chloride: 104 mmol/L (ref 96–106)
Creatinine, Ser: 1.01 mg/dL — ABNORMAL HIGH (ref 0.57–1.00)
Glucose: 91 mg/dL (ref 70–99)
Potassium: 4.5 mmol/L (ref 3.5–5.2)
Sodium: 143 mmol/L (ref 134–144)
eGFR: 62 mL/min/{1.73_m2} (ref >59)

## 2022-08-31 LAB — CBC
Hematocrit: 39.2 % (ref 34.0–46.6)
Hemoglobin: 11.9 g/dL (ref 11.1–15.9)
MCH: 26 pg — ABNORMAL LOW (ref 26.6–33.0)
MCHC: 30.4 g/dL — ABNORMAL LOW (ref 31.5–35.7)
MCV: 86 fL (ref 79–97)
Platelets: 239 10*3/uL (ref 150–450)
RBC: 4.58 x10E6/uL (ref 3.77–5.28)
RDW: 14.3 % (ref 11.7–15.4)
WBC: 7 10*3/uL (ref 3.4–10.8)

## 2022-09-09 ENCOUNTER — Other Ambulatory Visit: Payer: Self-pay | Admitting: Family Medicine

## 2022-09-09 DIAGNOSIS — J4489 Other specified chronic obstructive pulmonary disease: Secondary | ICD-10-CM

## 2022-09-09 MED ORDER — ALBUTEROL SULFATE HFA 108 (90 BASE) MCG/ACT IN AERS: 2.0000 | INHALATION_SPRAY | Freq: Four times a day (QID) | RESPIRATORY_TRACT | 3 refills | Status: DC | PRN

## 2022-09-09 NOTE — Telephone Encounter (Signed)
Prescription Request  09/09/2022  LOV: 06/03/2022  What is the name of the medication or equipment?   ALBUTEROL HFA (PROAIR) INHALER Qty. Prescribed: 8.5 GM [place](s) SIG; TAKE 2 PUFFS BY MOUTH EVERY 6 HOURS AS NEEDED FOR WHEEZE OR SHORTNESS OF BREATH  Have you contacted your pharmacy to request a refill? Yes   Which pharmacy would you like this sent to?  [id number] [place], [place] - [address] [place] [address] Phone: [phone] Fax: [phone]    Patient notified that their request is being sent to the clinical staff for review and that they should receive a response within 2 business days.   Please advise pharmacist.

## 2022-09-09 NOTE — Telephone Encounter (Signed)
Pharmacy also sent script to request refill of pantoprazole (PROTONIX) 40 MG tablet   Please see previous message in thread for other refill requested.

## 2022-09-19 ENCOUNTER — Other Ambulatory Visit: Payer: Self-pay | Admitting: Family Medicine

## 2022-09-19 ENCOUNTER — Telehealth: Payer: Self-pay | Admitting: Family Medicine

## 2022-09-19 ENCOUNTER — Other Ambulatory Visit: Payer: Self-pay

## 2022-09-19 ENCOUNTER — Other Ambulatory Visit: Payer: Self-pay | Admitting: Cardiovascular Disease

## 2022-09-19 DIAGNOSIS — K219 Gastro-esophageal reflux disease without esophagitis: Secondary | ICD-10-CM

## 2022-09-19 DIAGNOSIS — E1159 Type 2 diabetes mellitus with other circulatory complications: Secondary | ICD-10-CM

## 2022-09-19 MED ORDER — HYDROCODONE-ACETAMINOPHEN 5-325 MG PO TABS: 1.0000 | ORAL_TABLET | Freq: Four times a day (QID) | ORAL | 0 refills | Status: DC | PRN

## 2022-09-19 MED ORDER — SITAGLIPTIN PHOSPHATE 100 MG PO TABS
ORAL_TABLET | ORAL | 0 refills | Status: DC
Start: 2022-09-19 — End: 2022-10-21

## 2022-09-19 MED ORDER — PANTOPRAZOLE SODIUM 40 MG PO TBEC
40.0000 mg | DELAYED_RELEASE_TABLET | Freq: Every day | ORAL | 0 refills | Status: DC
Start: 2022-09-19 — End: 2022-10-21

## 2022-09-19 NOTE — Telephone Encounter (Signed)
Prescription Request  09/19/2022  LOV: 06/03/2022  What is the name of the medication or equipment?   HYDROcodone-acetaminophen (NORCO/VICODIN) 5-325 MG tablet   Have you contacted your pharmacy to request a refill? Yes   Which pharmacy would you like this sent to?  [id number] [place], [place] - [address] [place] [address] Phone: [phone] Fax: [phone]    Patient notified that their request is being sent to the clinical staff for review and that they should receive a response within 2 business days.   Please advise pharmacist.

## 2022-09-19 NOTE — Telephone Encounter (Signed)
Prescription Request  09/19/2022  LOV: 06/03/2022  What is the name of the medication or equipment?   sitaGLIPtin (JANUVIA) 100 MG tablet   pantoprazole (PROTONIX) 40 MG tablet [[id number]] - requesting 90 day supply  Have you contacted your pharmacy to request a refill? Yes   Which pharmacy would you like this sent to?  [id number] [place], [place] - [address] [facility] [address] [place] [address] Phone: [phone] Fax: [phone]    Patient notified that their request is being sent to the clinical staff for review and that they should receive a response within 2 business days.   Please advise pharmacist.

## 2022-09-20 ENCOUNTER — Other Ambulatory Visit: Payer: Self-pay

## 2022-09-20 ENCOUNTER — Telehealth: Payer: Self-pay | Admitting: Family Medicine

## 2022-09-20 ENCOUNTER — Other Ambulatory Visit (HOSPITAL_COMMUNITY): Payer: Self-pay

## 2022-09-20 DIAGNOSIS — E1159 Type 2 diabetes mellitus with other circulatory complications: Secondary | ICD-10-CM

## 2022-09-20 DIAGNOSIS — K861 Other chronic pancreatitis: Secondary | ICD-10-CM

## 2022-09-20 DIAGNOSIS — I25118 Atherosclerotic heart disease of native coronary artery with other forms of angina pectoris: Secondary | ICD-10-CM

## 2022-09-20 MED ORDER — PANCRELIPASE (LIP-PROT-AMYL) 36000-114000 UNITS PO CPEP
108000.0000 [IU] | ORAL_CAPSULE | Freq: Three times a day (TID) | ORAL | 1 refills | Status: DC
  Filled 2022-09-20: qty 200, 22d supply, fill #0
  Filled 2022-11-01: qty 200, 22d supply, fill #1

## 2022-09-20 NOTE — Telephone Encounter (Signed)
Requested medication (s) are due for refill today: routing for review  Requested medication (s) are on the active medication list: yes  Last refill:  08/23/22  Future visit scheduled: no  Notes to clinic:  Medication not assigned to a protocol, review manually.       Requested Prescriptions  Pending Prescriptions Disp Refills   lipase/protease/amylase (CREON) 36000 UNITS CPEP capsule 200 capsule 0    Sig: Take 2 capsules (72,000 Units total) by mouth 3 (three) times daily with meals. May also take 1 capsule (36,000 Units total) as needed (with snacks).     Off-Protocol Failed - 09/19/2022  5:56 PM      Failed - Medication not assigned to a protocol, review manually.      Failed - Valid encounter within last 12 months    Recent Outpatient Visits           1 year ago Type 2 diabetes mellitus with diabetic neuropathy, unspecified whether long term insulin use (HCC)   [facility] Donita Brooks, MD   2 years ago Type 2 diabetes mellitus with diabetic neuropathy, unspecified whether long term insulin use (HCC)   [facility] Pickard, Priscille Heidelberg, MD   2 years ago Gynecologic exam normal   [facility] Cathlean Marseilles A, NP   2 years ago Type 2 diabetes mellitus with other circulatory complication, without long-term current use of insulin (HCC)   [facility] Pickard, Priscille Heidelberg, MD   2 years ago Injury of left toe, initial encounter   [facility] [place], Velna Hatchet, MD       Future Appointments             In 1 month Kirke Corin, Chelsea Aus, MD [facility]

## 2022-09-20 NOTE — Telephone Encounter (Signed)
Prescription Request  09/20/2022  LOV: 06/03/2022  What is the name of the medication or equipment?   HYDROcodone-acetaminophen (NORCO/VICODIN) 5-325 MG tablet [[id number]]  **[facility]**  Have you contacted your pharmacy to request a refill? Yes   Which pharmacy would you like this sent to?  [facility] [address] [place] [address] Phone: [phone] Fax: [phone]    Patient notified that their request is being sent to the clinical staff for review and that they should receive a response within 2 business days.   Please advise at [phone].

## 2022-09-20 NOTE — Telephone Encounter (Signed)
Last OV 06/03/22 Requested Prescriptions  Pending Prescriptions Disp Refills   metFORMIN (GLUCOPHAGE) 1000 MG tablet [Pharmacy Med Name: METFORMIN HCL 1,000 MG TABLET] 180 tablet 3    Sig: TAKE 1 TABLET (1,000 MG TOTAL) BY MOUTH TWICE A DAY WITH FOOD     Endocrinology:  Diabetes - Biguanides Failed - 09/19/2022 10:11 AM      Failed - Cr in normal range and within 360 days    Creat  Date Value Ref Range Status  06/03/2022 1.07 (H) 0.50 - 1.05 mg/dL Final   Creatinine, Ser  Date Value Ref Range Status  08/30/2022 1.01 (H) 0.57 - 1.00 mg/dL Final   Creatinine, Urine  Date Value Ref Range Status  06/03/2022 33 20 - 275 mg/dL Final         Failed - B12 Level in normal range and within 720 days    Vitamin B-12  Date Value Ref Range Status  05/08/2020 313 200 - 1,100 pg/mL Final    Comment:    . Please Note: Although the reference range for vitamin B12 is 610 280 1653 pg/mL, it has been reported that between 5 and 10% of patients with values between 200 and 400 pg/mL may experience neuropsychiatric and hematologic abnormalities due to occult B12 deficiency; less than 1% of patients with values above 400 pg/mL will have symptoms. .          Failed - Valid encounter within last 6 months    Recent Outpatient Visits           1 year ago Type 2 diabetes mellitus with diabetic neuropathy, unspecified whether long term insulin use (HCC)   [facility] Pickard, Priscille Heidelberg, MD   2 years ago Type 2 diabetes mellitus with diabetic neuropathy, unspecified whether long term insulin use (HCC)   [facility] Pickard, Priscille Heidelberg, MD   2 years ago Gynecologic exam normal   [facility] Valentino Nose, NP   2 years ago Type 2 diabetes mellitus with other circulatory complication, without long-term current use of insulin (HCC)   [facility] Pickard, Priscille Heidelberg, MD   2 years ago Injury of left toe, initial encounter   [facility] Thoreau, Velna Hatchet, MD       Future Appointments             In 1 month Kirke Corin, Chelsea Aus, MD [place] [facility]            Passed - HBA1C is between 0 and 7.9 and within 180 days    Hgb A1c MFr Bld  Date Value Ref Range Status  06/03/2022 7.6 (H) <5.7 % of total Hgb Final    Comment:    For someone without known diabetes, a hemoglobin A1c value of 6.5% or greater indicates that they may have  diabetes and this should be confirmed with a follow-up  test. . For someone with known diabetes, a value <7% indicates  that their diabetes is well controlled and a value  greater than or equal to 7% indicates suboptimal  control. A1c targets should be individualized based on  duration of diabetes, age, comorbid conditions, and  other considerations. . Currently, no consensus exists regarding use of hemoglobin A1c for diagnosis of diabetes for children. .          Passed - eGFR in normal range and within 360 days    GFR, Est African American  Date  Value Ref Range Status  04/03/2020 69 > OR = 60 mL/min/1.66m2 Final   GFR, Est Non African American  Date Value Ref Range Status  04/03/2020 59 (L) > OR = 60 mL/min/1.17m2 Final   GFR, Estimated  Date Value Ref Range Status  06/30/2021 58 (L) >60 mL/min Final    Comment:    (NOTE) Calculated using the CKD-EPI Creatinine Equation (2021)    GFR  Date Value Ref Range Status  12/07/2017 59.28 (L) >60.00 mL/min Final   eGFR  Date Value Ref Range Status  08/30/2022 62 >59 mL/min/1.73 Final         Passed - CBC within normal limits and completed in the last 12 months    WBC  Date Value Ref Range Status  08/30/2022 7.0 3.4 - 10.8 x10E3/uL Final  06/03/2022 5.3 3.8 - 10.8 Thousand/uL Final   RBC  Date Value Ref Range Status  08/30/2022 4.58 3.77 - 5.28 x10E6/uL Final  06/03/2022 4.27 3.80 - 5.10 Million/uL Final   Hemoglobin  Date Value Ref Range Status  08/30/2022 11.9 11.1 - 15.9  g/dL Final   Hematocrit  Date Value Ref Range Status  08/30/2022 39.2 34.0 - 46.6 % Final   MCHC  Date Value Ref Range Status  08/30/2022 30.4 (L) 31.5 - 35.7 g/dL Final  86/57/8469 62.9 32.0 - 36.0 g/dL Final   [facility]  Date Value Ref Range Status  08/30/2022 26.0 (L) 26.6 - 33.0 pg Final  06/03/2022 27.2 27.0 - 33.0 pg Final   MCV  Date Value Ref Range Status  08/30/2022 86 79 - 97 fL Final   No results found for: "PLTCOUNTKUC", "LABPLAT", "POCPLA" RDW  Date Value Ref Range Status  08/30/2022 14.3 11.7 - 15.4 % Final

## 2022-09-20 NOTE — Telephone Encounter (Signed)
Patient called back to also request refill of lipase/protease/amylase (CREON) 36000 UNITS CPEP capsule   Please see previous message in thread.

## 2022-09-21 ENCOUNTER — Other Ambulatory Visit (HOSPITAL_COMMUNITY): Payer: Self-pay

## 2022-09-21 ENCOUNTER — Telehealth: Payer: Self-pay | Admitting: Family Medicine

## 2022-09-21 NOTE — Telephone Encounter (Signed)
Patient called asking if we can send her hydrocodone to the [facility] she states [facility] told [facility] they [facility]'t [facility] it at this time.  CB# [phone]

## 2022-09-22 ENCOUNTER — Other Ambulatory Visit (HOSPITAL_COMMUNITY): Payer: Self-pay

## 2022-09-22 ENCOUNTER — Other Ambulatory Visit: Payer: Self-pay | Admitting: Family Medicine

## 2022-09-22 MED ORDER — HYDROCODONE-ACETAMINOPHEN 5-325 MG PO TABS
1.0000 | ORAL_TABLET | Freq: Four times a day (QID) | ORAL | 0 refills | Status: DC | PRN
  Filled 2022-09-22: qty 130, 33d supply, fill #0

## 2022-09-26 ENCOUNTER — Telehealth: Payer: Self-pay | Admitting: Cardiovascular Disease

## 2022-09-26 ENCOUNTER — Telehealth: Payer: Self-pay | Admitting: *Deleted

## 2022-09-26 NOTE — Telephone Encounter (Signed)
Patient called because she was not sure what medications she should take or hold for her procedure on 8/14. Did discuss medication instructions. However she has already taken metformin this morning. Informed to not take anymore of her metformin. She verbalized understanding of procedure instructions.  She would like to know if she can keep her teeth in for the procedure

## 2022-09-26 NOTE — Telephone Encounter (Signed)
Pt c/o medication issue:  1. Name of Medication:   clopidogrel (PLAVIX) 75 MG tablet    2. How are you currently taking this medication (dosage and times per day)?  TAKE 1 TABLET(75 MG) BY MOUTH DAILY       3. Are you having a reaction (difficulty breathing--STAT)? No  4. What is your medication issue? Pt's spouse Orvilla Fus is requesting a callback to see if pt should stop taking this medication tomorrow or not. Please advise

## 2022-09-26 NOTE — Telephone Encounter (Signed)
Abdominal aortogram scheduled at [facility] for: Wednesday September 28, 2022 8:30 AM Arrival time [facility] Main Entrance A at: 6:30 AM  Nothing to eat after midnight prior to procedure, clear liquids until 5 AM day of procedure.  Medication instructions: -Hold:  Metformin-day of procedure and 48 hours post procedure  Actos/Januvia/Farxiga-AM of procedure -Other usual morning medications can be taken with sips of water including aspirin 81 mg and Plavix 75 mg.  Plan to go home the same day, you will only stay overnight if medically necessary.  You must have responsible adult to drive you home.  Someone must be with you the first 24 hours after you arrive home.  Reviewed procedure instructions with patient.

## 2022-09-28 ENCOUNTER — Encounter (HOSPITAL_COMMUNITY)
Admission: RE | Disposition: A | Discharge status: Home / Self Care | Payer: Self-pay | Source: Home / Self Care | Attending: Cardiovascular Disease

## 2022-09-28 ENCOUNTER — Other Ambulatory Visit: Payer: Self-pay | Admitting: *Deleted

## 2022-09-28 ENCOUNTER — Other Ambulatory Visit: Payer: Self-pay

## 2022-09-28 ENCOUNTER — Ambulatory Visit (HOSPITAL_COMMUNITY): Admission: RE | Admit: 2022-09-28 | Payer: HMO | Source: Home / Self Care | Admitting: Cardiovascular Disease

## 2022-09-28 DIAGNOSIS — Z7984 Long term (current) use of oral hypoglycemic drugs: Secondary | ICD-10-CM | POA: Insufficient documentation

## 2022-09-28 DIAGNOSIS — Z96653 Presence of artificial knee joint, bilateral: Secondary | ICD-10-CM | POA: Diagnosis not present

## 2022-09-28 DIAGNOSIS — Z79899 Other long term (current) drug therapy: Secondary | ICD-10-CM | POA: Diagnosis not present

## 2022-09-28 DIAGNOSIS — I70211 Atherosclerosis of native arteries of extremities with intermittent claudication, right leg: Secondary | ICD-10-CM

## 2022-09-28 DIAGNOSIS — I25119 Atherosclerotic heart disease of native coronary artery with unspecified angina pectoris: Secondary | ICD-10-CM | POA: Diagnosis not present

## 2022-09-28 DIAGNOSIS — E785 Hyperlipidemia, unspecified: Secondary | ICD-10-CM | POA: Insufficient documentation

## 2022-09-28 DIAGNOSIS — E1151 Type 2 diabetes mellitus with diabetic peripheral angiopathy without gangrene: Secondary | ICD-10-CM | POA: Insufficient documentation

## 2022-09-28 DIAGNOSIS — Z87891 Personal history of nicotine dependence: Secondary | ICD-10-CM | POA: Diagnosis not present

## 2022-09-28 DIAGNOSIS — I739 Peripheral vascular disease, unspecified: Secondary | ICD-10-CM

## 2022-09-28 HISTORY — PX: ABDOMINAL AORTOGRAM W/LOWER EXTREMITY: CATH118223

## 2022-09-28 HISTORY — PX: PERIPHERAL INTRAVASCULAR LITHOTRIPSY: CATH118324

## 2022-09-28 HISTORY — PX: PERIPHERAL VASCULAR INTERVENTION: CATH118257

## 2022-09-28 LAB — GLUCOSE, CAPILLARY
Glucose-Capillary: 133 mg/dL — ABNORMAL HIGH (ref 70–99)
Glucose-Capillary: 152 mg/dL — ABNORMAL HIGH (ref 70–99)

## 2022-09-28 SURGERY — ABDOMINAL AORTOGRAM W/LOWER EXTREMITY
Anesthesia: LOCAL

## 2022-09-28 MED ORDER — SODIUM CHLORIDE 0.9 % IV SOLN: INTRAVENOUS | Status: DC

## 2022-09-28 MED ORDER — ONDANSETRON HCL 4 MG/2ML IJ SOLN: 4.0000 mg | Freq: Four times a day (QID) | INTRAMUSCULAR | Status: DC | PRN

## 2022-09-28 MED ORDER — FENTANYL CITRATE (PF) 100 MCG/2ML IJ SOLN
INTRAMUSCULAR | Status: AC
  Filled 2022-09-28: qty 2

## 2022-09-28 MED ORDER — LIDOCAINE HCL (PF) 1 % IJ SOLN
INTRAMUSCULAR | Status: AC
  Filled 2022-09-28: qty 30

## 2022-09-28 MED ORDER — LABETALOL HCL 5 MG/ML IV SOLN: 10.0000 mg | INTRAVENOUS | Status: DC | PRN

## 2022-09-28 MED ORDER — MIDAZOLAM HCL 2 MG/2ML IJ SOLN
INTRAMUSCULAR | Status: AC
  Filled 2022-09-28: qty 2

## 2022-09-28 MED ORDER — HEPARIN SODIUM (PORCINE) 1000 UNIT/ML IJ SOLN
INTRAMUSCULAR | Status: DC | PRN
  Administered 2022-09-28: 8000 [IU] via INTRAVENOUS

## 2022-09-28 MED ORDER — SODIUM CHLORIDE 0.9% FLUSH: 3.0000 mL | INTRAVENOUS | Status: DC | PRN

## 2022-09-28 MED ORDER — IODIXANOL 320 MG/ML IV SOLN
INTRAVENOUS | Status: DC | PRN
  Administered 2022-09-28: 92 mL via INTRA_ARTERIAL

## 2022-09-28 MED ORDER — FENTANYL CITRATE (PF) 100 MCG/2ML IJ SOLN
50.0000 ug | Freq: Once | INTRAMUSCULAR | Status: AC
  Administered 2022-09-28: 50 ug via INTRAVENOUS
  Filled 2022-09-28: qty 2

## 2022-09-28 MED ORDER — HEPARIN (PORCINE) IN NACL 1000-0.9 UT/500ML-% IV SOLN
INTRAVENOUS | Status: DC | PRN
  Administered 2022-09-28 (×2): 500 mL

## 2022-09-28 MED ORDER — LIDOCAINE HCL (PF) 1 % IJ SOLN
INTRAMUSCULAR | Status: DC | PRN
  Administered 2022-09-28: 15 mL

## 2022-09-28 MED ORDER — FENTANYL CITRATE (PF) 100 MCG/2ML IJ SOLN
INTRAMUSCULAR | Status: DC | PRN
  Administered 2022-09-28: 25 ug via INTRAVENOUS
  Administered 2022-09-28 (×2): 50 ug via INTRAVENOUS

## 2022-09-28 MED ORDER — SODIUM CHLORIDE 0.9 % IV SOLN: 250.0000 mL | INTRAVENOUS | Status: DC | PRN

## 2022-09-28 MED ORDER — ASPIRIN 81 MG PO CHEW: 81.0000 mg | CHEWABLE_TABLET | ORAL | Status: DC

## 2022-09-28 MED ORDER — ACETAMINOPHEN 325 MG PO TABS
650.0000 mg | ORAL_TABLET | ORAL | Status: DC | PRN
  Filled 2022-09-28: qty 2

## 2022-09-28 MED ORDER — ASPIRIN 81 MG PO TBEC: 81.0000 mg | DELAYED_RELEASE_TABLET | Freq: Every day | ORAL | Status: AC

## 2022-09-28 MED ORDER — MIDAZOLAM HCL 2 MG/2ML IJ SOLN
INTRAMUSCULAR | Status: DC | PRN
  Administered 2022-09-28 (×2): 1 mg via INTRAVENOUS

## 2022-09-28 MED ORDER — SODIUM CHLORIDE 0.9% FLUSH: 3.0000 mL | Freq: Two times a day (BID) | INTRAVENOUS | Status: DC

## 2022-09-28 SURGICAL SUPPLY — 22 items
BAG SNAP BAND KOVER 36X36 (MISCELLANEOUS) IMPLANT
CATH ANGIO 5F PIGTAIL 65CM (CATHETERS) IMPLANT
CATH CROSS OVER TEMPO 5F (CATHETERS) IMPLANT
CATH SHOCKWAVE M5 5.5X60 (CATHETERS) IMPLANT
CATH STRAIGHT 5FR 65CM (CATHETERS) IMPLANT
COVER DOME SNAP 22 D (MISCELLANEOUS) IMPLANT
DCB RANGER 5.0X150 150 (BALLOONS) IMPLANT
DEVICE CLOSURE MYNXGRIP 6/7F (Vascular Products) IMPLANT
KIT ENCORE 26 ADVANTAGE (KITS) IMPLANT
KIT MICROPUNCTURE NIT STIFF (SHEATH) IMPLANT
KIT SYRINGE INJ CVI SPIKEX1 (MISCELLANEOUS) IMPLANT
PROTECTION STATION PRESSURIZED (MISCELLANEOUS) ×2
RANGER DCB 5.0X150 150 (BALLOONS) ×4
SET ATX-X65L (MISCELLANEOUS) IMPLANT
SHEATH CATAPULT 6FR 45 (SHEATH) IMPLANT
SHEATH PINNACLE 5F 10CM (SHEATH) IMPLANT
SHEATH PINNACLE 6F 10CM (SHEATH) IMPLANT
SHEATH PROBE COVER 6X72 (BAG) IMPLANT
STATION PROTECTION PRESSURIZED (MISCELLANEOUS) IMPLANT
TRAY PV CATH (CUSTOM PROCEDURE TRAY) ×2 IMPLANT
WIRE BENTSON .035X145CM (WIRE) IMPLANT
WIRE SHEPHERD 4G .014 (WIRE) IMPLANT

## 2022-09-28 NOTE — Discharge Instructions (Signed)
Femoral Site Care This sheet gives you information about how to care for yourself after your procedure. Your health care provider may also give you more specific instructions. If you have problems or questions, contact your health care provider. What can I expect after the procedure?  After the procedure, it is common to have: Bruising that usually fades within 1-2 weeks. Tenderness at the site. Follow these instructions at home: Wound care Follow instructions from your health care provider about how to take care of your insertion site. Make sure you: Wash your hands with soap and water before you change your bandage (dressing). If soap and water are not available, use hand sanitizer. Remove your dressing as told by your health care provider. 24 hours Do not take baths, swim, or use a hot tub until your health care provider approves. You may shower 24-48 hours after the procedure or as told by your health care provider. Gently wash the site with plain soap and water. Pat the area dry with a clean towel. Do not rub the site. This may cause bleeding. Do not apply powder or lotion to the site. Keep the site clean and dry. Check your femoral site every day for signs of infection. Check for: Redness, swelling, or pain. Fluid or blood. Warmth. Pus or a bad smell. Activity For the first 2-3 days after your procedure, or as long as directed: Avoid climbing stairs as much as possible. Do not squat. Do not lift anything that is heavier than 10 lb (4.5 kg), or the limit that you are told, until your health care provider says that it is safe. For 5 days Rest as directed. Avoid sitting for a long time without moving. Get up to take short walks every 1-2 hours. Do not drive for 24 hours if you were given a medicine to help you relax (sedative). General instructions Take over-the-counter and prescription medicines only as told by your health care provider. Keep all follow-up visits as told by your  health care provider. This is important. Contact a health care provider if you have: A fever or chills. You have redness, swelling, or pain around your insertion site. Get help right away if: The catheter insertion area swells very fast. You pass out. You suddenly start to sweat or your skin gets clammy. The catheter insertion area is bleeding, and the bleeding does not stop when you hold steady pressure on the area. The area near or just beyond the catheter insertion site becomes pale, cool, tingly, or numb. These symptoms may represent a serious problem that is an emergency. Do not wait to see if the symptoms will go away. Get medical help right away. Call your local emergency services (911 in the [place] Do not drive yourself to the hospital. Summary After the procedure, it is common to have bruising that usually fades within 1-2 weeks. Check your femoral site every day for signs of infection. Do not lift anything that is heavier than 10 lb (4.5 kg), or the limit that you are told, until your health care provider says that it is safe. This information is not intended to replace advice given to you by your health care provider. Make sure you discuss any questions you have with your health care provider. Document Revised: 02/13/2017 Document Reviewed: 02/13/2017 Elsevier Patient Education  2020 Elsevier Inc.  

## 2022-09-28 NOTE — Interval H&P Note (Signed)
History and Physical Interval Note:  09/28/2022 8:38 AM  [name]  has presented today for surgery, with the diagnosis of pad.  The various methods of treatment have been discussed with the patient and family. After consideration of risks, benefits and other options for treatment, the patient has consented to  Procedure(s): ABDOMINAL AORTOGRAM W/LOWER EXTREMITY (N/A) as a surgical intervention.  The patient's history has been reviewed, patient examined, no change in status, stable for surgery.  I have reviewed the patient's chart and labs.  Questions were answered to the patient's satisfaction.     Lorine Bears

## 2022-09-29 ENCOUNTER — Encounter (HOSPITAL_COMMUNITY): Payer: Self-pay | Admitting: Cardiovascular Disease

## 2022-09-29 LAB — POCT ACTIVATED CLOTTING TIME
Activated Clotting Time: 263 s
Activated Clotting Time: 281 s

## 2022-10-13 ENCOUNTER — Other Ambulatory Visit: Payer: Self-pay | Admitting: Family Medicine

## 2022-10-13 ENCOUNTER — Other Ambulatory Visit: Payer: Self-pay | Admitting: Cardiovascular Disease

## 2022-10-13 DIAGNOSIS — M79604 Pain in right leg: Secondary | ICD-10-CM

## 2022-10-13 DIAGNOSIS — I739 Peripheral vascular disease, unspecified: Secondary | ICD-10-CM

## 2022-10-14 NOTE — Telephone Encounter (Signed)
Pharmacy requesting refills of  clopidogrel (PLAVIX) 75 MG tablet   methocarbamol (ROBAXIN) 500 MG tablet [[id number]]   Pharmacy:   [id number] [place], [place] - [address] [place] [address] Phone: [phone]  Fax: [phone] DEA #: [id number]   Please advise pharmacist.

## 2022-10-14 NOTE — Telephone Encounter (Signed)
Requests are too soon for refill.  Requested Prescriptions  Pending Prescriptions Disp Refills   methocarbamol (ROBAXIN) 500 MG tablet [Pharmacy Med Name: METHOCARBAMOL 500 MG TABLET] 120 tablet 1    Sig: TAKE 1 TABLET BY MOUTH EVERY 6 HOURS AS NEEDED FOR MUSCLE SPASM     Not Delegated - Analgesics:  Muscle Relaxants Failed - 10/13/2022  4:37 PM      Failed - This refill cannot be delegated      Failed - Valid encounter within last 6 months    Recent Outpatient Visits           1 year ago Type 2 diabetes mellitus with diabetic neuropathy, unspecified whether long term insulin use (HCC)   [facility] Pickard, Priscille Heidelberg, MD   2 years ago Type 2 diabetes mellitus with diabetic neuropathy, unspecified whether long term insulin use (HCC)   [facility] Pickard, Priscille Heidelberg, MD   2 years ago Gynecologic exam normal   [facility] Cathlean Marseilles A, NP   2 years ago Type 2 diabetes mellitus with other circulatory complication, without long-term current use of insulin (HCC)   [facility] Pickard, Priscille Heidelberg, MD   2 years ago Injury of left toe, initial encounter   [facility] Driscoll, Velna Hatchet, MD       Future Appointments             In 1 week Iran Ouch, MD [facility]             FARXIGA 10 MG TABS tablet [Pharmacy Med Name: FARXIGA 10 MG TABLET] 90 tablet 1    Sig: TAKE 1 TABLET BY MOUTH EVERY DAY BEFORE BREAKFAST     Endocrinology:  Diabetes - SGLT2 Inhibitors Failed - 10/13/2022  4:37 PM      Failed - Cr in normal range and within 360 days    Creat  Date Value Ref Range Status  06/03/2022 1.07 (H) 0.50 - 1.05 mg/dL Final   Creatinine, Ser  Date Value Ref Range Status  08/30/2022 1.01 (H) 0.57 - 1.00 mg/dL Final   Creatinine, Urine  Date Value Ref Range Status  06/03/2022 33 20 - 275 mg/dL Final         Failed - Valid encounter within last 6 months     Recent Outpatient Visits           1 year ago Type 2 diabetes mellitus with diabetic neuropathy, unspecified whether long term insulin use (HCC)   [facility] Donita Brooks, MD   2 years ago Type 2 diabetes mellitus with diabetic neuropathy, unspecified whether long term insulin use (HCC)   [facility] Pickard, Priscille Heidelberg, MD   2 years ago Gynecologic exam normal   [facility] Cathlean Marseilles A, NP   2 years ago Type 2 diabetes mellitus with other circulatory complication, without long-term current use of insulin (HCC)   [facility] Pickard, Priscille Heidelberg, MD   2 years ago Injury of left toe, initial encounter   [facility] [place], Velna Hatchet, MD       Future Appointments             In 1 week Kirke Corin, Chelsea Aus, MD [facility]            Passed - HBA1C is between 0 and  7.9 and within 180 days    Hgb A1c MFr Bld  Date Value Ref Range Status  06/03/2022 7.6 (H) <5.7 % of total Hgb Final    Comment:    For someone without known diabetes, a hemoglobin A1c value of 6.5% or greater indicates that they may have  diabetes and this should be confirmed with a follow-up  test. . For someone with known diabetes, a value <7% indicates  that their diabetes is well controlled and a value  greater than or equal to 7% indicates suboptimal  control. A1c targets should be individualized based on  duration of diabetes, age, comorbid conditions, and  other considerations. . Currently, no consensus exists regarding use of hemoglobin A1c for diagnosis of diabetes for children. .          Passed - eGFR in normal range and within 360 days    GFR, Est African American  Date Value Ref Range Status  04/03/2020 69 > OR = 60 mL/min/1.84m2 Final   GFR, Est Non African American  Date Value Ref Range Status  04/03/2020 59 (L) > OR = 60 mL/min/1.32m2 Final   GFR, Estimated  Date  Value Ref Range Status  06/30/2021 58 (L) >60 mL/min Final    Comment:    (NOTE) Calculated using the CKD-EPI Creatinine Equation (2021)    GFR  Date Value Ref Range Status  12/07/2017 59.28 (L) >60.00 mL/min Final   eGFR  Date Value Ref Range Status  08/30/2022 62 >59 mL/min/1.73 Final

## 2022-10-14 NOTE — Telephone Encounter (Addendum)
Patient called to follow up on refill; requesting courtesy refill. Patient will run out of pills in a few days.  LOV 06/03/22  Patient scheduled for med refill appointment on 10/21/22.  Pharmacy confirmed as:  [id number] [place], [place] - [address] [place] [address] Phone: [phone]  Fax: [phone] DEA #: [id number]   Please advise patient at [phone].

## 2022-10-18 ENCOUNTER — Encounter (HOSPITAL_COMMUNITY): Payer: HMO

## 2022-10-20 ENCOUNTER — Telehealth: Payer: Self-pay | Admitting: Family Medicine

## 2022-10-20 ENCOUNTER — Other Ambulatory Visit: Payer: Self-pay

## 2022-10-20 DIAGNOSIS — I739 Peripheral vascular disease, unspecified: Secondary | ICD-10-CM

## 2022-10-20 DIAGNOSIS — I70209 Unspecified atherosclerosis of native arteries of extremities, unspecified extremity: Secondary | ICD-10-CM

## 2022-10-20 DIAGNOSIS — I701 Atherosclerosis of renal artery: Secondary | ICD-10-CM

## 2022-10-20 MED ORDER — CLOPIDOGREL BISULFATE 75 MG PO TABS: ORAL_TABLET | ORAL | 1 refills | Status: DC

## 2022-10-20 NOTE — Telephone Encounter (Signed)
Prescription Request  10/20/2022  LOV: 06/03/2022  What is the name of the medication or equipment? clopidogrel (PLAVIX) 75 MG tablet [[id number]]   Have you contacted your pharmacy to request a refill? Yes   Which pharmacy would you like this sent to?  [id number] [place], [place] - [address] AT [address] [place] [address] Phone: [phone] Fax: [phone]    Patient notified that their request is being sent to the clinical staff for review and that they should receive a response within 2 business days.   Please advise at [facility] [phone]

## 2022-10-21 ENCOUNTER — Other Ambulatory Visit: Payer: Self-pay | Admitting: Family Medicine

## 2022-10-21 ENCOUNTER — Other Ambulatory Visit (HOSPITAL_COMMUNITY): Payer: Self-pay

## 2022-10-21 ENCOUNTER — Ambulatory Visit (INDEPENDENT_AMBULATORY_CARE_PROVIDER_SITE_OTHER): Payer: HMO | Admitting: Family Medicine

## 2022-10-21 ENCOUNTER — Telehealth: Payer: Self-pay

## 2022-10-21 ENCOUNTER — Other Ambulatory Visit: Payer: Self-pay

## 2022-10-21 ENCOUNTER — Encounter: Payer: Self-pay | Admitting: Family Medicine

## 2022-10-21 VITALS — BP 130/82 | HR 85 | Temp 98.1°F | Ht 62.0 in | Wt 202.8 lb

## 2022-10-21 DIAGNOSIS — E1140 Type 2 diabetes mellitus with diabetic neuropathy, unspecified: Secondary | ICD-10-CM

## 2022-10-21 DIAGNOSIS — M1710 Unilateral primary osteoarthritis, unspecified knee: Secondary | ICD-10-CM

## 2022-10-21 DIAGNOSIS — M79604 Pain in right leg: Secondary | ICD-10-CM

## 2022-10-21 DIAGNOSIS — K219 Gastro-esophageal reflux disease without esophagitis: Secondary | ICD-10-CM

## 2022-10-21 DIAGNOSIS — E1159 Type 2 diabetes mellitus with other circulatory complications: Secondary | ICD-10-CM

## 2022-10-21 DIAGNOSIS — I739 Peripheral vascular disease, unspecified: Secondary | ICD-10-CM | POA: Diagnosis not present

## 2022-10-21 DIAGNOSIS — M79605 Pain in left leg: Secondary | ICD-10-CM | POA: Diagnosis not present

## 2022-10-21 DIAGNOSIS — Z7984 Long term (current) use of oral hypoglycemic drugs: Secondary | ICD-10-CM | POA: Diagnosis not present

## 2022-10-21 MED ORDER — HYDROCODONE-ACETAMINOPHEN 5-325 MG PO TABS: 1.0000 | ORAL_TABLET | Freq: Four times a day (QID) | ORAL | 0 refills | Status: DC | PRN

## 2022-10-21 MED ORDER — PREGABALIN 50 MG PO CAPS: 50.0000 mg | ORAL_CAPSULE | Freq: Three times a day (TID) | ORAL | 1 refills | Status: DC

## 2022-10-21 MED ORDER — HYDROCODONE-ACETAMINOPHEN 5-325 MG PO TABS
1.0000 | ORAL_TABLET | Freq: Four times a day (QID) | ORAL | 0 refills | Status: DC | PRN
Start: 2022-10-21 — End: 2022-11-18
  Filled 2022-10-21 – 2022-10-24 (×2): qty 130, 33d supply, fill #0

## 2022-10-21 MED ORDER — SITAGLIPTIN PHOSPHATE 100 MG PO TABS
ORAL_TABLET | ORAL | 1 refills | Status: DC
Start: 2022-10-21 — End: 2023-06-05

## 2022-10-21 MED ORDER — PANTOPRAZOLE SODIUM 40 MG PO TBEC
40.0000 mg | DELAYED_RELEASE_TABLET | Freq: Every day | ORAL | 1 refills | Status: DC
Start: 2022-10-21 — End: 2023-07-20

## 2022-10-21 MED ORDER — PREGABALIN 50 MG PO CAPS
50.0000 mg | ORAL_CAPSULE | Freq: Three times a day (TID) | ORAL | 1 refills | Status: DC
Start: 2022-10-21 — End: 2022-11-01

## 2022-10-21 MED ORDER — METHOCARBAMOL 500 MG PO TABS
ORAL_TABLET | ORAL | 1 refills | Status: DC
Start: 2022-10-21 — End: 2022-12-08

## 2022-10-21 MED ORDER — DOXYCYCLINE HYCLATE 100 MG PO TABS: 100.0000 mg | ORAL_TABLET | Freq: Two times a day (BID) | ORAL | 0 refills | Status: DC

## 2022-10-21 NOTE — Addendum Note (Signed)
Addended by: Lynnea Ferrier T on: 10/21/2022 11:57 AM   Modules accepted: Orders

## 2022-10-21 NOTE — Telephone Encounter (Signed)
Pt called in about a few of her refills;   Pt would like for refill of this med (HYDROcodone-acetaminophen) to be resent to [facility]. Pt also stated that this med methocarbamol (ROBAXIN) 500 MG tablet [[id number]] was not refilled along with her other meds. Please advise.  Cb#: [phone]

## 2022-10-21 NOTE — Progress Notes (Signed)
Subjective:    Patient ID: [name], female    DOB: 13-May-1955, 67 y.o.   MRN: [id number]  Patient is here today for a follow-up of her diabetes.  She is currently on metformin, Actos, Januvia, and [place].  She is unable to take GLP-1 agonist due to her history of pancreatitis.  Her recent A1c was greater than 7 in April.  She is due to recheck that today.  She is not checking her blood sugars.  She denies any polyuria polydipsia or blurry vision.  She denies any chest pain or shortness of breath or dyspnea on exertion.  There is a 1.5 cm erythematous scaly excoriated lesion in the center of her chest between her breasts.  She states that it began as a pustule/pimple.  She picked that up to try to pop it and it became swollen red and irritated.  Today is nonfluctuant on exam.  It is erythematous warm and tender.  It is firm to the touch.  However she has scratched and picked at it so much that it is difficult to determine what it is.  I am concerned about malignancy Past Medical History:  Diagnosis Date   Aortic atherosclerosis (HCC)    Asthma    CAD (coronary artery disease)    a. LHC 2011: Diffuse distal and branch vessel disease - patient managed medically, no interventional options. b. Nuc 03/2014 - low risk, no ischemia.   Complication of anesthesia    slow to wake up with last surgery in 2011    Diabetes mellitus with circulatory complication (HCC)    Diverticulosis    Dyslipidemia    GERD (gastroesophageal reflux disease)    Headache    hx of migraines    Hypertension    Osteoarthritis    severe right knee  R TKR   PAD (peripheral artery disease) (HCC)    a. Severe stenosis mid right SFA s/p atherectomy 03/25/09. b. peripheral angiography in 12/2011 which showed only about 50% diffuse right SFA stenosis.   Pancreatitis 2003   Renal artery stenosis (HCC)    a. LHC 2011 - 40-50% left RAS.   Tobacco use disorder    quit 11/10   Past Surgical History:  Procedure  Laterality Date   ABDOMINAL AORTAGRAM N/A 12/21/2011   Procedure: ABDOMINAL AORTAGRAM;  Surgeon: Iran Ouch, MD;  Location: [facility];  Service: Cardiovascular;  Laterality: N/A;   ABDOMINAL AORTOGRAM W/LOWER EXTREMITY Bilateral 05/08/2019   Procedure: ABDOMINAL AORTOGRAM W/LOWER EXTREMITY;  Surgeon: Iran Ouch, MD;  Location: [facility];  Service: Cardiovascular;  Laterality: Bilateral;   ABDOMINAL AORTOGRAM W/LOWER EXTREMITY N/A 09/28/2022   Procedure: ABDOMINAL AORTOGRAM W/LOWER EXTREMITY;  Surgeon: Iran Ouch, MD;  Location: [facility];  Service: Cardiovascular;  Laterality: N/A;   CARDIAC CATHETERIZATION     ENDARTERECTOMY FEMORAL Right 10/10/2019   Procedure: RIGHT FEMORAL ENDARTERECTOMY;  Surgeon: Cephus Shelling, MD;  Location: [facility] OR;  Service: Vascular;  Laterality: Right;   ESOPHAGOGASTRODUODENOSCOPY (EGD) WITH PROPOFOL N/A 10/05/2017   Procedure: ESOPHAGOGASTRODUODENOSCOPY (EGD) WITH PROPOFOL;  Surgeon: Rachael Fee, MD;  Location: [facility];  Service: Endoscopy;  Laterality: N/A;   EUS N/A 10/05/2017   Procedure: UPPER ENDOSCOPIC ULTRASOUND (EUS) RADIAL;  Surgeon: Rachael Fee, MD;  Location: [facility];  Service: Endoscopy;  Laterality: N/A;   FOOT SURGERY Left    LOWER EXTREMITY ANGIOGRAM Bilateral 05/27/2015   Procedure: Lower Extremity Angiogram;  Surgeon: Iran Ouch, MD;  Location: [facility];  Service: Cardiovascular;  Laterality: Bilateral;   PATCH ANGIOPLASTY Right 10/10/2019   Procedure: PATCH ANGIOPLASTY USING Livia Snellen BIOLOGIC PATCH;  Surgeon: Cephus Shelling, MD;  Location: [facility] OR;  Service: Vascular;  Laterality: Right;   PERIPHERAL INTRAVASCULAR LITHOTRIPSY  09/28/2022   Procedure: PERIPHERAL INTRAVASCULAR LITHOTRIPSY;  Surgeon: Iran Ouch, MD;  Location: [facility];  Service: Cardiovascular;;   PERIPHERAL VASCULAR ATHERECTOMY Left 05/08/2019   Procedure: PERIPHERAL VASCULAR ATHERECTOMY;   Surgeon: Iran Ouch, MD;  [facility]: [facility];  Service: Cardiovascular;  Laterality: Left;   PERIPHERAL VASCULAR CATHETERIZATION N/A 05/27/2015   Procedure: Abdominal Aortogram;  Surgeon: Iran Ouch, MD;  [facility]: [facility];  Service: Cardiovascular;  Laterality: N/A;   PERIPHERAL VASCULAR CATHETERIZATION Right 05/27/2015   Procedure: Peripheral Vascular Intervention;  Surgeon: Iran Ouch, MD;  Location: [facility];  Service: Cardiovascular;  Laterality: Right;  SFA   PERIPHERAL VASCULAR INTERVENTION  09/28/2022   Procedure: PERIPHERAL VASCULAR INTERVENTION;  Surgeon: Iran Ouch, MD;  Location: [facility];  Service: Cardiovascular;;   TOTAL KNEE ARTHROPLASTY Right 2011   TOTAL KNEE ARTHROPLASTY Left 05/22/2014   Procedure: LEFT TOTAL KNEE ARTHROPLASTY;  Surgeon: Ranee Gosselin, MD;  Location: [facility];  Service: Orthopedics;  Laterality: Left;   TRIGGER FINGER RELEASE Left 07/07/2021   Procedure: LEFT INDEX FINGER RELEASE TRIGGER FINGER/A-1 PULLEY / CARPOMETACARPAL INJECTION;  Surgeon: Marlyne Beards, MD;  Location: [facility];  Service: Orthopedics;  Laterality: Left;   TUBAL LIGATION  1980   Current Outpatient Medications on File Prior to Visit  Medication Sig Dispense Refill   acetaminophen (TYLENOL) 500 MG tablet Take 1,000 mg by mouth every 6 (six) hours as needed for moderate pain or headache.     albuterol (VENTOLIN HFA) 108 (90 Base) MCG/ACT inhaler Inhale 2 puffs into the lungs every 6 (six) hours as needed for wheezing or shortness of breath. 8.5 each 3   aspirin EC 81 MG tablet Take 1 tablet (81 mg total) by mouth daily. Swallow whole.     bisoprolol (ZEBETA) 5 MG tablet TAKE 1/2 TABLET(2.5 MG) BY MOUTH DAILY 45 tablet 3   Calcium Polycarbophil (FIBER-CAPS PO) Take 1 capsule by mouth daily.     clopidogrel (PLAVIX) 75 MG tablet TAKE 1 TABLET(75 MG) BY MOUTH DAILY 90 tablet 1   COLLAGEN PO Take 1,000 mg by mouth  daily.     Cyanocobalamin (VITAMIN B-12) 5000 MCG TBDP Take 5,000 mcg by mouth daily.     dapagliflozin propanediol (FARXIGA) 10 MG TABS tablet TAKE 1 TABLET BY MOUTH DAILY BEFORE BREAKFAST. 90 tablet 1   diclofenac sodium (VOLTAREN) 1 % GEL Apply 2 g topically 4 (four) times daily. (Patient taking differently: Apply 2 g topically 4 (four) times daily as needed (for hand pain).) 100 g 0   fluticasone-salmeterol (ADVAIR DISKUS) 250-50 MCG/ACT AEPB Inhale 1 puff into the lungs in the morning and at bedtime. 60 each 11   fluticasone-salmeterol (WIXELA INHUB) 250-50 MCG/ACT AEPB Inhale 1 puff into the lungs in the morning and at bedtime.     HYDROcodone-acetaminophen (NORCO/VICODIN) 5-325 MG tablet Take 1 tablet by mouth every 6 (six) hours as needed for moderate pain. Chronic Pain. Dx: G89.4 Patient needs to schedule office visit 130 tablet 0   isosorbide mononitrate (IMDUR) 60 MG 24 hr tablet TAKE 1 TABLET BY MOUTH EVERY DAY 90 tablet 1   lipase/protease/amylase (CREON) 36000 UNITS  CPEP capsule Take 3 capsules (108,000 Units total) by mouth 3 (three) times daily with meals. May also take 1 capsule (36,000 Units total) as needed (with snacks). 200 capsule 1   loratadine (CLARITIN) 10 MG tablet Take 10 mg by mouth daily.     metFORMIN (GLUCOPHAGE) 1000 MG tablet TAKE 1 TABLET (1,000 MG TOTAL) BY MOUTH TWICE A DAY WITH FOOD 180 tablet 1   methocarbamol (ROBAXIN) 500 MG tablet TAKE 1 TABLET BY MOUTH EVERY 6 HOURS AS NEEDED FOR MUSCLE SPASM 120 tablet 1   Multiple Vitamin (MULTIVITAMIN) tablet Take 1 tablet by mouth daily.     nitroGLYCERIN (NITROSTAT) 0.4 MG SL tablet Place 1 tablet under the tongue every 5 (five) minutes as needed for chest pain.     ONETOUCH ULTRA TEST test strip SMARTSIG:Via Meter 1-3 Times Daily     pantoprazole (PROTONIX) 40 MG tablet Take 1 tablet (40 mg total) by mouth daily. 90 tablet 0   pioglitazone (ACTOS) 30 MG tablet TAKE 1 TABLET(30 MG) BY MOUTH DAILY 90 tablet 1    pregabalin (LYRICA) 50 MG capsule Take 1 capsule (50 mg total) by mouth 3 (three) times daily. 90 capsule 3   Probiotic Product (TRUBIOTICS PO) Take 1 capsule by mouth daily.     promethazine (PHENERGAN) 25 MG tablet TAKE 1 TABLET(25 MG) BY MOUTH EVERY 8 HOURS AS NEEDED FOR NAUSEA OR VOMITING (Patient taking differently: Take 25 mg by mouth every morning.) 30 tablet 0   rosuvastatin (CRESTOR) 40 MG tablet TAKE 1 TABLET(40 MG) BY MOUTH EVERY NIGHT 90 tablet 1   sitaGLIPtin (JANUVIA) 100 MG tablet TAKE 1 TABLET BY MOUTH EVERY DAY 90 tablet 0   Current Facility-Administered Medications on File Prior to Visit  Medication Dose Route Frequency Provider Last Rate Last Admin   triamcinolone acetonide (KENALOG) 10 MG/ML injection 10 mg  10 mg Other Once Regal, Norman S, DPM       Allergies  Allergen Reactions   Gabapentin Other (See Comments)    Brain shakes   Glucotrol [Glipizide] Rash and Other (See Comments)    Red rash   Influenza Vaccines Other (See Comments)    Significant arm soreness requiring 1 year of physical therapy   Latex Rash   Nortriptyline Other (See Comments)    BRAIN SHAKE   Tetanus Toxoid Hives, Swelling, Rash and Other (See Comments)    Swelling to site of injection with rash and fever   Other Itching, Other (See Comments) and Cough    Patient is highly allergic to Cats and she begins to break out in rash and turns red if someone caring for her has cats at home.  Patient takes Claritin for this type of reaction.  Patient stated that if it goes untreated then he begins to have difficulty breathing   Prednisone & Diphenhydramine     Pt get really headaches   Invokana [Canagliflozin] Other (See Comments)    Yeast Infections   Social History   Socioeconomic History   Marital status: Married    Spouse name: Not on file   Number of children: Not on file   Years of education: 10   Highest education level: Not on file  Occupational History   Not on file  Tobacco Use    Smoking status: Former    Current packs/day: 0.00    Types: Cigarettes    Quit date: 12/13/2008    Years since quitting: 13.8   Smokeless tobacco: Never  Vaping Use  Vaping status: Never Used  Substance and Sexual Activity   Alcohol use: No    Comment: Quit drinking around year 2000   Drug use: No    Comment: Patient quit smoking around 2011   Sexual activity: Yes    Partners: Male    Birth control/protection: Post-menopausal  Other Topics Concern   Not on file  Social History Narrative   Denies any IV drug use or marijuana use.  Former smoker for 30 years, quit Nov. 2010. She is married with 2 children. Disabled from knee, not working.   Social Determinants of Health   Financial Resource Strain: Medium Risk (09/28/2018)   Overall Financial Resource Strain (CARDIA)    Difficulty of Paying Living Expenses: Somewhat hard  Food Insecurity: Not on file  Transportation Needs: No Transportation Needs (09/28/2018)   PRAPARE - Administrator, Civil Service (Medical): No    Lack of Transportation (Non-Medical): No  Physical Activity: Not on file  Stress: Not on file  Social Connections: Unknown (09/28/2018)   Social Connection and Isolation Panel [NHANES]    Frequency of Communication with Friends and Family: Once a week    Frequency of Social Gatherings with Friends and Family: Once a week    Attends Religious Services: Not on Marketing executive or Organizations: Not on file    Attends Banker Meetings: Not on file    Marital Status: Married  Intimate Partner Violence: Not At Risk (09/28/2018)   Humiliation, Afraid, Rape, and Kick questionnaire    Fear of Current or Ex-Partner: No    Emotionally Abused: No    Physically Abused: No    Sexually Abused: No   Family History  Problem Relation Age of Onset   CVA Mother    Emphysema Father    Breast cancer Sister 68   Colon cancer Neg Hx      Review of Systems  All other systems reviewed and  are negative.      Objective:   Physical Exam Vitals reviewed.  Constitutional:      General: She is not in acute distress.    Appearance: She is well-developed. She is not diaphoretic.  Eyes:     Conjunctiva/sclera: Conjunctivae normal.  Neck:     Vascular: No carotid bruit or JVD.  Cardiovascular:     Rate and Rhythm: Normal rate and regular rhythm.     Heart sounds: Normal heart sounds. No murmur heard. Pulmonary:     Effort: Pulmonary effort is normal.     Breath sounds: Normal breath sounds. No wheezing or rales.  Chest:     Chest wall: No tenderness.    Abdominal:     General: Bowel sounds are normal. There is no distension.     Palpations: Abdomen is soft. There is no mass.     Tenderness: There is no abdominal tenderness. There is no guarding or rebound.  Musculoskeletal:     Cervical back: Neck supple.     Right lower leg: No edema.     Left lower leg: No edema.  Skin:    Findings: No erythema or rash.  Neurological:     Mental Status: She is alert and oriented to person, place, and time.     Cranial Nerves: No cranial nerve deficit.     Motor: No abnormal muscle tone.     Coordination: Coordination normal.     Deep Tendon Reflexes: Reflexes are normal and symmetric.  Assessment & Plan:  Type 2 diabetes mellitus with diabetic neuropathy, unspecified whether long term insulin use (HCC) - Plan: Hemoglobin A1c, CBC with Differential/Platelet, COMPLETE METABOLIC PANEL WITH GFR, Lipid panel Her blood pressure today is excellent.  Check a CBC, CMP, lipid panel.  Her goal LDL cholesterol is less than 70.  Check an A1c.  Her goal A1c is less than 6.5.  Patient refuses insulin.  If hemoglobin A1c is elevated, her choices would be to try Ozempic and discontinue Januvia or to increase pioglitazone.  I am hesitant to try the Ozempic given her history of pancreatitis.  Await the results of the lab work.  We will try her on doxycycline 100 mg twice daily for 1 week  for the erythematous pustular lesion in the center of her chest.  If persistent, I would recommend shave biopsy to rule out malignancy.

## 2022-10-22 LAB — COMPLETE METABOLIC PANEL WITH GFR
AG Ratio: 1.6 (calc) (ref 1.0–2.5)
ALT: 18 U/L (ref 6–29)
AST: 19 U/L (ref 10–35)
Albumin: 4.3 g/dL (ref 3.6–5.1)
Alkaline phosphatase (APISO): 85 U/L (ref 37–153)
BUN: 20 mg/dL (ref 7–25)
CO2: 22 mmol/L (ref 20–32)
Calcium: 9.5 mg/dL (ref 8.6–10.4)
Chloride: 107 mmol/L (ref 98–110)
Creat: 1.04 mg/dL (ref 0.50–1.05)
Globulin: 2.7 g/dL (calc) (ref 1.9–3.7)
Glucose, Bld: 133 mg/dL — ABNORMAL HIGH (ref 65–99)
Potassium: 4.4 mmol/L (ref 3.5–5.3)
Sodium: 140 mmol/L (ref 135–146)
Total Bilirubin: 0.3 mg/dL (ref 0.2–1.2)
Total Protein: 7 g/dL (ref 6.1–8.1)
eGFR: 60 mL/min/{1.73_m2} (ref >=60)

## 2022-10-22 LAB — CBC WITH DIFFERENTIAL/PLATELET
Absolute Monocytes: 485 {cells}/uL (ref 200–950)
Basophils Absolute: 51 {cells}/uL (ref 0–200)
Basophils Relative: 0.9 %
Eosinophils Absolute: 422 {cells}/uL (ref 15–500)
Eosinophils Relative: 7.4 %
HCT: 37.5 % (ref 35.0–45.0)
Hemoglobin: 11.9 g/dL (ref 11.7–15.5)
Lymphs Abs: 1436 {cells}/uL (ref 850–3900)
MCH: 27.5 pg (ref 27.0–33.0)
MCHC: 31.7 g/dL — ABNORMAL LOW (ref 32.0–36.0)
MCV: 86.8 fL (ref 80.0–100.0)
MPV: 9.8 fL (ref 7.5–12.5)
Monocytes Relative: 8.5 %
Neutro Abs: 3306 {cells}/uL (ref 1500–7800)
Neutrophils Relative %: 58 %
Platelets: 235 10*3/uL (ref 140–400)
RBC: 4.32 10*6/uL (ref 3.80–5.10)
RDW: 13.6 % (ref 11.0–15.0)
Total Lymphocyte: 25.2 %
WBC: 5.7 10*3/uL (ref 3.8–10.8)

## 2022-10-22 LAB — LIPID PANEL
Cholesterol: 154 mg/dL (ref <200)
HDL: 44 mg/dL — ABNORMAL LOW (ref >=50)
LDL Cholesterol (Calc): 70 mg/dL
Non-HDL Cholesterol (Calc): 110 mg/dL (ref <130)
Total CHOL/HDL Ratio: 3.5 (calc) (ref <5.0)
Triglycerides: 331 mg/dL — ABNORMAL HIGH (ref <150)

## 2022-10-22 LAB — HEMOGLOBIN A1C
Hgb A1c MFr Bld: 7.5 %{Hb} — ABNORMAL HIGH (ref <5.7)
Mean Plasma Glucose: 169 mg/dL
eAG (mmol/L): 9.3 mmol/L

## 2022-10-24 ENCOUNTER — Other Ambulatory Visit (HOSPITAL_COMMUNITY): Payer: Self-pay

## 2022-10-24 ENCOUNTER — Other Ambulatory Visit: Payer: Self-pay

## 2022-10-24 NOTE — Telephone Encounter (Signed)
Requested medications are due for refill today.  yes  Requested medications are on the active medications list.  yes  Last refill. 08/04/2022 #30 0 rf  Future visit scheduled.   no  Notes to clinic.  Refill not delegated.    Requested Prescriptions  Pending Prescriptions Disp Refills   promethazine (PHENERGAN) 25 MG tablet [Pharmacy Med Name: PROMETHAZINE 25 MG TABLET] 30 tablet 0    Sig: TAKE 1 TABLET(25 MG) BY MOUTH EVERY 8 HOURS AS NEEDED FOR NAUSEA OR VOMITING     Not Delegated - Gastroenterology: Antiemetics Failed - 10/21/2022  5:12 PM      Failed - This refill cannot be delegated      Failed - Valid encounter within last 6 months    Recent Outpatient Visits           1 year ago Type 2 diabetes mellitus with diabetic neuropathy, unspecified whether long term insulin use (HCC)   [facility] Pickard, Priscille Heidelberg, MD   2 years ago Type 2 diabetes mellitus with diabetic neuropathy, unspecified whether long term insulin use (HCC)   [facility] Pickard, Priscille Heidelberg, MD   2 years ago Gynecologic exam normal   [facility] Cathlean Marseilles A, NP   2 years ago Type 2 diabetes mellitus with other circulatory complication, without long-term current use of insulin (HCC)   [facility] Pickard, Priscille Heidelberg, MD   2 years ago Injury of left toe, initial encounter   [facility] Napoleon, Velna Hatchet, MD       Future Appointments             Tomorrow Kirke Corin, Chelsea Aus, MD [facility]

## 2022-10-25 ENCOUNTER — Ambulatory Visit (HOSPITAL_COMMUNITY)
Admission: RE | Admit: 2022-10-25 | Discharge: 2022-10-25 | Disposition: A | Discharge status: Home / Self Care | Payer: HMO | Source: Ambulatory Visit | Attending: Cardiology | Admitting: Cardiology

## 2022-10-25 ENCOUNTER — Encounter: Payer: Self-pay | Admitting: Cardiovascular Disease

## 2022-10-25 ENCOUNTER — Ambulatory Visit (HOSPITAL_BASED_OUTPATIENT_CLINIC_OR_DEPARTMENT_OTHER)
Admission: RE | Admit: 2022-10-25 | Discharge: 2022-10-25 | Disposition: A | Discharge status: Home / Self Care | Payer: HMO | Source: Ambulatory Visit | Attending: Cardiovascular Disease | Admitting: Cardiovascular Disease

## 2022-10-25 ENCOUNTER — Ambulatory Visit (INDEPENDENT_AMBULATORY_CARE_PROVIDER_SITE_OTHER): Payer: HMO | Admitting: Cardiovascular Disease

## 2022-10-25 VITALS — BP 122/68 | HR 86 | Ht 62.0 in | Wt 203.8 lb

## 2022-10-25 DIAGNOSIS — E785 Hyperlipidemia, unspecified: Secondary | ICD-10-CM

## 2022-10-25 DIAGNOSIS — I739 Peripheral vascular disease, unspecified: Secondary | ICD-10-CM | POA: Insufficient documentation

## 2022-10-25 DIAGNOSIS — I25118 Atherosclerotic heart disease of native coronary artery with other forms of angina pectoris: Secondary | ICD-10-CM | POA: Diagnosis not present

## 2022-10-25 NOTE — Progress Notes (Signed)
Cardiology Office Note   Date:  10/25/2022   ID:  Shashana, Rear 03/26/1955, MRN [id number]  PCP:  Donita Brooks, MD  Cardiologist:  Jessy Oto chief complaint on file.      History of Present Illness: [name] is a 67 y.o. female who presents for a follow up visit regarding regarding peripheral arterial disease and coronary artery disease. She has known history of peripheral arterial disease status post atherectomy of the right SFA in 2011 by Dr. Sanjuana Kava. She has other chronic medical conditions including CAD, DM, HTN, chronic back pain, recurrent idiopathic pancreatitis and hyperlipidemia. She is a former smoker.  She is s/p bilateral TKR.   She had worsening right leg claudication in April 2017. Angiography showed no significant aortoiliac disease. There was moderate right common femoral artery stenosis and severe discrete stenosis in the proximal right SFA with three-vessel runoff below the knee. There was no significant obstructive disease involving the left lower extremity. A self-expanding stent was placed to the proximal right SFA. She had previous bilateral knee replacement and chronic bilateral leg pain.   She had worsening left leg claudication in 2021.  Vascular studies showed mildly reduced ABI bilaterally in the 0.8 range.  Duplex showed moderate right common femoral artery stenosis and moderate SFA disease.  On the left, there was severe new stenosis in the proximal SFA. Angiography was done in March of 2021 which showed borderline stenosis affecting the left common iliac artery, severe calcified stenosis affecting the left proximal SFA and three-vessel runoff below the knee.  On the right, there was severe calcified disease affecting the common femoral artery with patent proximal SFA stent followed by 60% disease throughout the whole SFA and three-vessel runoff below the knee.  I performed successful orbital atherectomy and drug-coated balloon  angioplasty to the left SFA.   She underwent staged right common femoral artery endarterectomy by Dr. Chestine Spore in August of 2021.  She was seen recently for worsening right calf claudication.  She was prescribed cilostazol but she did not tolerate the medication due to GI symptoms and headache.  She stopped the medication.  I proceeded with angiography last month which showed severe diffuse and calcified disease affecting the whole length of the right SFA and proximal popliteal artery with three-vessel runoff below the knee.  There was mild disease on the left side.  I performed intravascular lithotripsy and drug-coated balloon angioplasty to the right SFA.  She reports significant improvement in right calf claudication.  No symptoms on the left.  No chest pain or worsening dyspnea.   Past Medical History:  Diagnosis Date   Aortic atherosclerosis (HCC)    Asthma    CAD (coronary artery disease)    a. LHC 2011: Diffuse distal and branch vessel disease - patient managed medically, no interventional options. b. Nuc 03/2014 - low risk, no ischemia.   Complication of anesthesia    slow to wake up with last surgery in 2011    Diabetes mellitus with circulatory complication (HCC)    Diverticulosis    Dyslipidemia    GERD (gastroesophageal reflux disease)    Headache    hx of migraines    Hypertension    Osteoarthritis    severe right knee  R TKR   PAD (peripheral artery disease) (HCC)    a. Severe stenosis mid right SFA s/p atherectomy 03/25/09. b. peripheral angiography in 12/2011 which showed only about 50% diffuse right SFA stenosis.   Pancreatitis  2003   Renal artery stenosis (HCC)    a. LHC 2011 - 40-50% left RAS.   Tobacco use disorder    quit 11/10    Past Surgical History:  Procedure Laterality Date   ABDOMINAL AORTAGRAM N/A 12/21/2011   Procedure: ABDOMINAL AORTAGRAM;  Surgeon: Iran Ouch, MD;  Location: [facility];  Service: Cardiovascular;  Laterality: N/A;   ABDOMINAL  AORTOGRAM W/LOWER EXTREMITY Bilateral 05/08/2019   Procedure: ABDOMINAL AORTOGRAM W/LOWER EXTREMITY;  Surgeon: Iran Ouch, MD;  Location: [facility];  Service: Cardiovascular;  Laterality: Bilateral;   ABDOMINAL AORTOGRAM W/LOWER EXTREMITY N/A 09/28/2022   Procedure: ABDOMINAL AORTOGRAM W/LOWER EXTREMITY;  Surgeon: Iran Ouch, MD;  Location: [facility];  Service: Cardiovascular;  Laterality: N/A;   CARDIAC CATHETERIZATION     ENDARTERECTOMY FEMORAL Right 10/10/2019   Procedure: RIGHT FEMORAL ENDARTERECTOMY;  Surgeon: Cephus Shelling, MD;  Location: [facility] OR;  Service: Vascular;  Laterality: Right;   ESOPHAGOGASTRODUODENOSCOPY (EGD) WITH PROPOFOL N/A 10/05/2017   Procedure: ESOPHAGOGASTRODUODENOSCOPY (EGD) WITH PROPOFOL;  Surgeon: Rachael Fee, MD;  Location: [facility];  Service: Endoscopy;  Laterality: N/A;   EUS N/A 10/05/2017   Procedure: UPPER ENDOSCOPIC ULTRASOUND (EUS) RADIAL;  Surgeon: Rachael Fee, MD;  Location: [facility];  Service: Endoscopy;  Laterality: N/A;   FOOT SURGERY Left    LOWER EXTREMITY ANGIOGRAM Bilateral 05/27/2015   Procedure: Lower Extremity Angiogram;  Surgeon: Iran Ouch, MD;  [facility]: [facility];  Service: Cardiovascular;  Laterality: Bilateral;   PATCH ANGIOPLASTY Right 10/10/2019   Procedure: PATCH ANGIOPLASTY USING Livia Snellen BIOLOGIC PATCH;  Surgeon: Cephus Shelling, MD;  Location: [facility] OR;  Service: Vascular;  Laterality: Right;   PERIPHERAL INTRAVASCULAR LITHOTRIPSY  09/28/2022   Procedure: PERIPHERAL INTRAVASCULAR LITHOTRIPSY;  Surgeon: Iran Ouch, MD;  [facility]: [facility];  Service: Cardiovascular;;   PERIPHERAL VASCULAR ATHERECTOMY Left 05/08/2019   Procedure: PERIPHERAL VASCULAR ATHERECTOMY;  Surgeon: Iran Ouch, MD;  [facility]: [facility];  Service: Cardiovascular;  Laterality: Left;   PERIPHERAL VASCULAR CATHETERIZATION N/A 05/27/2015   Procedure: Abdominal Aortogram;   Surgeon: Iran Ouch, MD;  [facility]: [facility];  Service: Cardiovascular;  Laterality: N/A;   PERIPHERAL VASCULAR CATHETERIZATION Right 05/27/2015   Procedure: Peripheral Vascular Intervention;  Surgeon: Iran Ouch, MD;  [facility]: [facility];  Service: Cardiovascular;  Laterality: Right;  SFA   PERIPHERAL VASCULAR INTERVENTION  09/28/2022   Procedure: PERIPHERAL VASCULAR INTERVENTION;  Surgeon: Iran Ouch, MD;  [facility]: [facility];  Service: Cardiovascular;;   TOTAL KNEE ARTHROPLASTY Right 2011   TOTAL KNEE ARTHROPLASTY Left 05/22/2014   Procedure: LEFT TOTAL KNEE ARTHROPLASTY;  Surgeon: Ranee Gosselin, MD;  Location: [facility];  Service: Orthopedics;  Laterality: Left;   TRIGGER FINGER RELEASE Left 07/07/2021   Procedure: LEFT INDEX FINGER RELEASE TRIGGER FINGER/A-1 PULLEY / CARPOMETACARPAL INJECTION;  Surgeon: Marlyne Beards, MD;  Location:  SURGERY CENTER;  Service: Orthopedics;  Laterality: Left;   TUBAL LIGATION  1980     Current Outpatient Medications  Medication Sig Dispense Refill   acetaminophen (TYLENOL) 500 MG tablet Take 1,000 mg by mouth every 6 (six) hours as needed for moderate pain or headache.     albuterol (VENTOLIN HFA) 108 (90 Base) MCG/ACT inhaler Inhale 2 puffs into the lungs every 6 (six) hours as needed for wheezing or shortness of breath. 8.5 each 3   aspirin EC 81 MG tablet Take  1 tablet (81 mg total) by mouth daily. Swallow whole.     bisoprolol (ZEBETA) 5 MG tablet TAKE 1/2 TABLET(2.5 MG) BY MOUTH DAILY 45 tablet 3   Calcium Polycarbophil (FIBER-CAPS PO) Take 1 capsule by mouth daily.     clopidogrel (PLAVIX) 75 MG tablet TAKE 1 TABLET(75 MG) BY MOUTH DAILY 90 tablet 1   COLLAGEN PO Take 1,000 mg by mouth daily.     Cyanocobalamin (VITAMIN B-12) 5000 MCG TBDP Take 5,000 mcg by mouth daily.     dapagliflozin propanediol (FARXIGA) 10 MG TABS tablet TAKE 1 TABLET BY MOUTH DAILY BEFORE BREAKFAST. 90 tablet 1    diclofenac sodium (VOLTAREN) 1 % GEL Apply 2 g topically 4 (four) times daily. (Patient taking differently: Apply 2 g topically 4 (four) times daily as needed (for hand pain).) 100 g 0   doxycycline (VIBRA-TABS) 100 MG tablet Take 1 tablet (100 mg total) by mouth 2 (two) times daily. 14 tablet 0   fluticasone-salmeterol (WIXELA INHUB) 250-50 MCG/ACT AEPB Inhale 1 puff into the lungs in the morning and at bedtime.     HYDROcodone-acetaminophen (NORCO/VICODIN) 5-325 MG tablet Take 1 tablet by mouth every 6 (six) hours as needed for moderate pain. Patient needs to schedule office visit 130 tablet 0   isosorbide mononitrate (IMDUR) 60 MG 24 hr tablet TAKE 1 TABLET BY MOUTH EVERY DAY 90 tablet 1   lipase/protease/amylase (CREON) 36000 UNITS CPEP capsule Take 3 capsules (108,000 Units total) by mouth 3 (three) times daily with meals. May also take 1 capsule (36,000 Units total) as needed (with snacks). 200 capsule 1   loratadine (CLARITIN) 10 MG tablet Take 10 mg by mouth daily.     metFORMIN (GLUCOPHAGE) 1000 MG tablet TAKE 1 TABLET (1,000 MG TOTAL) BY MOUTH TWICE A DAY WITH FOOD 180 tablet 1   methocarbamol (ROBAXIN) 500 MG tablet TAKE 1 TABLET BY MOUTH EVERY 6 HOURS AS NEEDED FOR MUSCLE SPASM 120 tablet 1   Multiple Vitamin (MULTIVITAMIN) tablet Take 1 tablet by mouth daily.     nitroGLYCERIN (NITROSTAT) 0.4 MG SL tablet Place 1 tablet under the tongue every 5 (five) minutes as needed for chest pain.     ONETOUCH ULTRA TEST test strip SMARTSIG:Via Meter 1-3 Times Daily     pantoprazole (PROTONIX) 40 MG tablet Take 1 tablet (40 mg total) by mouth daily. 90 tablet 1   pioglitazone (ACTOS) 30 MG tablet TAKE 1 TABLET(30 MG) BY MOUTH DAILY 90 tablet 1   pregabalin (LYRICA) 50 MG capsule Take 1 capsule (50 mg total) by mouth 3 (three) times daily. 90 capsule 1   Probiotic Product (TRUBIOTICS PO) Take 1 capsule by mouth daily.     promethazine (PHENERGAN) 25 MG tablet TAKE 1 TABLET(25 MG) BY MOUTH EVERY 8  HOURS AS NEEDED FOR NAUSEA OR VOMITING (Patient taking differently: Take 25 mg by mouth every morning.) 30 tablet 0   rosuvastatin (CRESTOR) 40 MG tablet TAKE 1 TABLET(40 MG) BY MOUTH EVERY NIGHT 90 tablet 1   sitaGLIPtin (JANUVIA) 100 MG tablet TAKE 1 TABLET BY MOUTH EVERY DAY 90 tablet 1   fluticasone-salmeterol (ADVAIR DISKUS) 250-50 MCG/ACT AEPB Inhale 1 puff into the lungs in the morning and at bedtime. (Patient not taking: Reported on 10/25/2022) 60 each 11   Current Facility-Administered Medications  Medication Dose Route Frequency Provider Last Rate Last Admin   triamcinolone acetonide (KENALOG) 10 MG/ML injection 10 mg  10 mg Other Once Lenn Sink, DPM  Allergies:   Gabapentin, Glucotrol [glipizide], Influenza vaccines, Latex, Nortriptyline, Tetanus toxoid, Other, Prednisone & diphenhydramine, and Invokana [canagliflozin]    Social History:  The patient  reports that she quit smoking about 13 years ago. Her smoking use included cigarettes. She has never used smokeless tobacco. She reports that she does not drink alcohol and does not use drugs.   Family History:  The patient's family history includes Breast cancer (age of onset: 59) in her sister; CVA in her mother; Emphysema in her father.    ROS:  Please see the history of present illness.   Otherwise, review of systems are positive for .   All other systems are reviewed and negative.    PHYSICAL EXAM: VS:  BP 122/68 (BP Location: Left Arm, Patient Position: Sitting, Cuff Size: Normal)   Pulse 86   Ht 5\' 2"  (1.575 m)   Wt 203 lb 12.8 oz (92.4 kg)   SpO2 96%   BMI 37.28 kg/m  , BMI Body mass index is 37.28 kg/m. GEN: Well nourished, well developed, in no acute distress  HEENT: normal  Neck: no JVD, carotid bruits, or masses Cardiac: RRR; no murmurs, rubs, or gallops,no edema  Respiratory:  clear to auscultation bilaterally, normal work of breathing GI: soft, nontender, nondistended, + BS MS: no deformity or  atrophy  Skin: warm and dry, no rash Neuro:  Strength and sensation are intact Psych: euthymic mood, full affect Vascular: Femoral pulses: Normal bilaterally.  Distal pulses are palpable.  EKG:  EKG is not ordered today.    Recent Labs: 10/21/2022: ALT 18; BUN 20; Creat 1.04; Hemoglobin 11.9; Platelets 235; Potassium 4.4; Sodium 140    Lipid Panel    Component Value Date/Time   CHOL 154 10/21/2022 1118   TRIG 331 (H) 10/21/2022 1118   HDL 44 (L) 10/21/2022 1118   CHOLHDL 3.5 10/21/2022 1118   VLDL 35 10/11/2019 0219   LDLCALC 70 10/21/2022 1118   LDLDIRECT 50.6 01/02/2014 1003      Wt Readings from Last 3 Encounters:  10/25/22 203 lb 12.8 oz (92.4 kg)  10/21/22 202 lb 12.8 oz (92 kg)  09/28/22 190 lb (86.2 kg)       ASSESSMENT AND PLAN:  1.  PAD with severe lifestyle limiting claudication: Status post bilateral SFA interventions most recently involving the right SFA and the proximal portion of the popliteal artery.  She had successful intravascular lithotripsy and drug-coated balloon angioplasty.  Vascular studies were done today and were reviewed by me.  It showed normalization of ABI and relatively normal velocities in the SFA.  She does have moderately elevated velocity in the popliteal artery.  Continue dual antiplatelet therapy.  Repeat Doppler studies in 6 months.    2. Coronary artery disease involving native coronary arteries with other forms of angina:   Previous cardiac catheterization in 2011 showed distal LAD and small branch disease which is being managed medically.  Most recent Lexiscan Myoview in May 2022 showed evidence of mild anterior wall ischemia.  She has been treated medically given stability of symptoms.  3. Hyperlipidemia:  Continue treatment with high-dose rosuvastatin.  I reviewed her recent labs done last week which showed an LDL of 70.  4. Diabetes mellitus: Managed by primary care physician.  Most recent hemoglobin A1c was 7.5.  She is on [place]  and other medications.    Disposition:   Follow-up in 6 months.  Signed,  Lorine Bears, MD  10/25/2022 9:40 AM    [facility]

## 2022-10-25 NOTE — Patient Instructions (Signed)
Medication Instructions:  No changes *If you need a refill on your cardiac medications before your next appointment, please call your pharmacy*   Lab Work: None ordered If you have labs (blood work) drawn today and your tests are completely normal, you will receive your results only by: MyChart Message (if you have MyChart) OR A paper copy in the mail If you have any lab test that is abnormal or we need to change your treatment, we will call you to review the results.   Testing/Procedures: Your physician has requested that you have an ankle brachial index (ABI) in 6 months. During this test an ultrasound and blood pressure cuff are used to evaluate the arteries that supply the arms and legs with blood. Allow thirty minutes for this exam. There are no restrictions or special instructions. This will take place at [address], [address].   Your physician has requested that you have a lower extremity arterial duplex in 6 months. During this test, ultrasound is used to evaluate arterial blood flow in the legs. Allow one hour for this exam. There are no restrictions or special instructions. This will take place at [address], [address].    Follow-Up: At [facility], you and your health needs are our priority.  As part of our continuing mission to provide you with exceptional heart care, we have created designated Provider Care Teams.  These Care Teams include your primary Cardiologist (physician) and Advanced Practice Providers (APPs -  Physician Assistants and Nurse Practitioners) who all work together to provide you with the care you need, when you need it.  We recommend signing up for the patient portal called "MyChart".  Sign up information is provided on this After Visit Summary.  MyChart is used to connect with patients for Virtual Visits (Telemedicine).  Patients are able to view lab/test results, encounter notes, upcoming appointments, etc.  Non-urgent messages can  be sent to your provider as well.   To learn more about what you can do with MyChart, go to [email or web address].    Your next appointment:   Follow up with Dr. Kirke Corin in 6 months after testing

## 2022-10-25 NOTE — Telephone Encounter (Signed)
Pharmacy sent script to request refills of the following:  promethazine (PHENERGAN) 25 MG tablet [[id number]]   **HYDROcodone-acetaminophen (NORCO/VICODIN) 5-325 MG tablet - **Product backordered/unavailable. NO ETA**  Pharmacy:  [id number] [place], [place] - [address] [place] [address] Phone: [phone]  Fax: [phone] DEA #: [id number]    Please advise pharmacist.

## 2022-10-27 ENCOUNTER — Other Ambulatory Visit: Payer: Self-pay | Admitting: Cardiovascular Disease

## 2022-10-28 MED ORDER — CLOPIDOGREL BISULFATE 75 MG PO TABS
ORAL_TABLET | ORAL | 1 refills | Status: DC
Start: 2022-10-28 — End: 2022-11-01

## 2022-10-28 NOTE — Telephone Encounter (Signed)
Pharmacy requesting for recent refill of clopidogrel (PLAVIX) 75 MG tablet to be adjusted to a 90 day supply.  Pharmacy:  [id number] [place], [place] - [address] [place] [address] Phone: [phone]  Fax: [phone] DEA #: [id number]   Please advise pharmacist.

## 2022-10-28 NOTE — Addendum Note (Signed)
Addended by: Arta Silence on: 10/28/2022 03:42 PM   Modules accepted: Orders

## 2022-10-30 LAB — VAS US ABI WITH/WO TBI
Left ABI: 1.11
Right ABI: 1.06

## 2022-10-31 ENCOUNTER — Telehealth: Payer: Self-pay | Admitting: Family Medicine

## 2022-10-31 NOTE — Telephone Encounter (Signed)
Prescription Request  10/31/2022  LOV: 10/21/2022  What is the name of the medication or equipment?   pregabalin (LYRICA) 50 MG capsule **no pills left**  clopidogrel (PLAVIX) 75 MG tablet [[id number]]   promethazine (PHENERGAN) 25 MG tablet [[id number]]   Have you contacted your pharmacy to request a refill? Yes   Which pharmacy would you like this sent to?  [id number] [place], [place] - [address] AT [address] [place] [address] Phone: [phone] Fax: [phone]    Patient notified that their request is being sent to the clinical staff for review and that they should receive a response within 2 business days.   Please advise patient at [phone].

## 2022-11-01 ENCOUNTER — Telehealth: Payer: Self-pay | Admitting: Family Medicine

## 2022-11-01 ENCOUNTER — Other Ambulatory Visit: Payer: Self-pay | Admitting: Family Medicine

## 2022-11-01 ENCOUNTER — Other Ambulatory Visit: Payer: Self-pay

## 2022-11-01 DIAGNOSIS — M1710 Unilateral primary osteoarthritis, unspecified knee: Secondary | ICD-10-CM

## 2022-11-01 DIAGNOSIS — I701 Atherosclerosis of renal artery: Secondary | ICD-10-CM

## 2022-11-01 DIAGNOSIS — J4489 Other specified chronic obstructive pulmonary disease: Secondary | ICD-10-CM

## 2022-11-01 DIAGNOSIS — I739 Peripheral vascular disease, unspecified: Secondary | ICD-10-CM

## 2022-11-01 DIAGNOSIS — M79604 Pain in right leg: Secondary | ICD-10-CM

## 2022-11-01 DIAGNOSIS — I70209 Unspecified atherosclerosis of native arteries of extremities, unspecified extremity: Secondary | ICD-10-CM

## 2022-11-01 MED ORDER — CLOPIDOGREL BISULFATE 75 MG PO TABS
ORAL_TABLET | ORAL | 1 refills | Status: DC
Start: 2022-11-01 — End: 2023-11-07

## 2022-11-01 MED ORDER — PROMETHAZINE HCL 25 MG PO TABS: ORAL_TABLET | ORAL | 0 refills | Status: DC

## 2022-11-01 MED ORDER — PREGABALIN 50 MG PO CAPS
50.0000 mg | ORAL_CAPSULE | Freq: Three times a day (TID) | ORAL | 1 refills | Status: DC
Start: 2022-11-01 — End: 2023-04-24

## 2022-11-01 NOTE — Telephone Encounter (Signed)
Patient called to ask for provider to switch back to ADVAIR: stated it worked better than her current script for fluticasone-salmeterol [facility] INHUB) 250-50 MCG/ACT AEPB   Please advise at [phone].

## 2022-11-02 ENCOUNTER — Other Ambulatory Visit: Payer: Self-pay

## 2022-11-02 DIAGNOSIS — J4489 Other specified chronic obstructive pulmonary disease: Secondary | ICD-10-CM

## 2022-11-02 MED ORDER — FLUTICASONE-SALMETEROL 250-50 MCG/ACT IN AEPB: 1.0000 | INHALATION_SPRAY | Freq: Two times a day (BID) | RESPIRATORY_TRACT | 11 refills | Status: DC

## 2022-11-03 ENCOUNTER — Ambulatory Visit: Payer: HMO | Admitting: Family Medicine

## 2022-11-03 ENCOUNTER — Other Ambulatory Visit (HOSPITAL_COMMUNITY): Payer: Self-pay

## 2022-11-03 ENCOUNTER — Other Ambulatory Visit: Payer: Self-pay | Admitting: Family Medicine

## 2022-11-03 VITALS — BP 112/62 | HR 85 | Temp 98.1°F | Ht 62.0 in | Wt 202.2 lb

## 2022-11-03 DIAGNOSIS — D485 Neoplasm of uncertain behavior of skin: Secondary | ICD-10-CM

## 2022-11-03 DIAGNOSIS — L989 Disorder of the skin and subcutaneous tissue, unspecified: Secondary | ICD-10-CM | POA: Diagnosis not present

## 2022-11-03 NOTE — Progress Notes (Signed)
Subjective:    Patient ID: [name], female    DOB: 1955/11/14, 67 y.o.   MRN: [id number]  Patient is here today  with a 1.5 cm erythematous scaly excoriated lesion in the center of her chest between her breasts.  She states that it began as a pustule/pimple.  She picked that up to try to pop it and it became swollen red and irritated.  Today is nonfluctuant on exam.  It is erythematous warm and tender.  It is firm to the touch.  However she has scratched and picked at it so much that it is difficult to determine what it is.  I am concerned about malignancy.  We tried treating it empirically with doxycycline in case it is a pustule however this has not improved the lesion at all.  Is not as red and irritated today however it appears to be a malignancy  Past Medical History:  Diagnosis Date   Aortic atherosclerosis (HCC)    Asthma    CAD (coronary artery disease)    a. LHC 2011: Diffuse distal and branch vessel disease - patient managed medically, no interventional options. b. Nuc 03/2014 - low risk, no ischemia.   Complication of anesthesia    slow to wake up with last surgery in 2011    Diabetes mellitus with circulatory complication (HCC)    Diverticulosis    Dyslipidemia    GERD (gastroesophageal reflux disease)    Headache    hx of migraines    Hypertension    Osteoarthritis    severe right knee  R TKR   PAD (peripheral artery disease) (HCC)    a. Severe stenosis mid right SFA s/p atherectomy 03/25/09. b. peripheral angiography in 12/2011 which showed only about 50% diffuse right SFA stenosis.   Pancreatitis 2003   Renal artery stenosis (HCC)    a. LHC 2011 - 40-50% left RAS.   Tobacco use disorder    quit 11/10   Past Surgical History:  Procedure Laterality Date   ABDOMINAL AORTAGRAM N/A 12/21/2011   Procedure: ABDOMINAL AORTAGRAM;  Surgeon: Iran Ouch, MD;  Location: [facility];  Service: Cardiovascular;  Laterality: N/A;   ABDOMINAL AORTOGRAM W/LOWER  EXTREMITY Bilateral 05/08/2019   Procedure: ABDOMINAL AORTOGRAM W/LOWER EXTREMITY;  Surgeon: Iran Ouch, MD;  Location: [facility];  Service: Cardiovascular;  Laterality: Bilateral;   ABDOMINAL AORTOGRAM W/LOWER EXTREMITY N/A 09/28/2022   Procedure: ABDOMINAL AORTOGRAM W/LOWER EXTREMITY;  Surgeon: Iran Ouch, MD;  Location: [facility];  Service: Cardiovascular;  Laterality: N/A;   CARDIAC CATHETERIZATION     ENDARTERECTOMY FEMORAL Right 10/10/2019   Procedure: RIGHT FEMORAL ENDARTERECTOMY;  Surgeon: Cephus Shelling, MD;  Location: [facility] OR;  Service: Vascular;  Laterality: Right;   ESOPHAGOGASTRODUODENOSCOPY (EGD) WITH PROPOFOL N/A 10/05/2017   Procedure: ESOPHAGOGASTRODUODENOSCOPY (EGD) WITH PROPOFOL;  Surgeon: Rachael Fee, MD;  Location: [facility];  Service: Endoscopy;  Laterality: N/A;   EUS N/A 10/05/2017   Procedure: UPPER ENDOSCOPIC ULTRASOUND (EUS) RADIAL;  Surgeon: Rachael Fee, MD;  Location: [facility];  Service: Endoscopy;  Laterality: N/A;   FOOT SURGERY Left    LOWER EXTREMITY ANGIOGRAM Bilateral 05/27/2015   Procedure: Lower Extremity Angiogram;  Surgeon: Iran Ouch, MD;  [facility]: [facility];  Service: Cardiovascular;  Laterality: Bilateral;   PATCH ANGIOPLASTY Right 10/10/2019   Procedure: PATCH ANGIOPLASTY USING Livia Snellen BIOLOGIC PATCH;  Surgeon: Cephus Shelling, MD;  Location: [facility] OR;  Service: Vascular;  Laterality: Right;   PERIPHERAL INTRAVASCULAR LITHOTRIPSY  09/28/2022   Procedure: PERIPHERAL INTRAVASCULAR LITHOTRIPSY;  Surgeon: Iran Ouch, MD;  [facility]: [facility];  Service: Cardiovascular;;   PERIPHERAL VASCULAR ATHERECTOMY Left 05/08/2019   Procedure: PERIPHERAL VASCULAR ATHERECTOMY;  Surgeon: Iran Ouch, MD;  [facility]: [facility];  Service: Cardiovascular;  Laterality: Left;   PERIPHERAL VASCULAR CATHETERIZATION N/A 05/27/2015   Procedure: Abdominal Aortogram;  Surgeon: Iran Ouch, MD;  [facility]: [facility];  Service: Cardiovascular;  Laterality: N/A;   PERIPHERAL VASCULAR CATHETERIZATION Right 05/27/2015   Procedure: Peripheral Vascular Intervention;  Surgeon: Iran Ouch, MD;  [facility]: [facility];  Service: Cardiovascular;  Laterality: Right;  SFA   PERIPHERAL VASCULAR INTERVENTION  09/28/2022   Procedure: PERIPHERAL VASCULAR INTERVENTION;  Surgeon: Iran Ouch, MD;  [facility]: [facility];  Service: Cardiovascular;;   TOTAL KNEE ARTHROPLASTY Right 2011   TOTAL KNEE ARTHROPLASTY Left 05/22/2014   Procedure: LEFT TOTAL KNEE ARTHROPLASTY;  Surgeon: Ranee Gosselin, MD;  Location: [facility];  Service: Orthopedics;  Laterality: Left;   TRIGGER FINGER RELEASE Left 07/07/2021   Procedure: LEFT INDEX FINGER RELEASE TRIGGER FINGER/A-1 PULLEY / CARPOMETACARPAL INJECTION;  Surgeon: Marlyne Beards, MD;  Location: [facility];  Service: Orthopedics;  Laterality: Left;   TUBAL LIGATION  1980   Current Outpatient Medications on File Prior to Visit  Medication Sig Dispense Refill   acetaminophen (TYLENOL) 500 MG tablet Take 1,000 mg by mouth every 6 (six) hours as needed for moderate pain or headache.     albuterol (VENTOLIN HFA) 108 (90 Base) MCG/ACT inhaler Inhale 2 puffs into the lungs every 6 (six) hours as needed for wheezing or shortness of breath. 8.5 each 3   aspirin EC 81 MG tablet Take 1 tablet (81 mg total) by mouth daily. Swallow whole.     bisoprolol (ZEBETA) 5 MG tablet TAKE 1/2 TABLET(2.5 MG) BY MOUTH DAILY 45 tablet 3   Calcium Polycarbophil (FIBER-CAPS PO) Take 1 capsule by mouth daily.     clopidogrel (PLAVIX) 75 MG tablet TAKE 1 TABLET(75 MG) BY MOUTH DAILY 90 tablet 1   COLLAGEN PO Take 1,000 mg by mouth daily.     Cyanocobalamin (VITAMIN B-12) 5000 MCG TBDP Take 5,000 mcg by mouth daily.     dapagliflozin propanediol (FARXIGA) 10 MG TABS tablet TAKE 1 TABLET BY MOUTH DAILY BEFORE BREAKFAST. 90 tablet 1   diclofenac  sodium (VOLTAREN) 1 % GEL Apply 2 g topically 4 (four) times daily. (Patient taking differently: Apply 2 g topically 4 (four) times daily as needed (for hand pain).) 100 g 0   fluticasone-salmeterol (ADVAIR DISKUS) 250-50 MCG/ACT AEPB Inhale 1 puff into the lungs in the morning and at bedtime. 60 each 11   HYDROcodone-acetaminophen (NORCO/VICODIN) 5-325 MG tablet Take 1 tablet by mouth every 6 (six) hours as needed for moderate pain. Patient needs to schedule office visit 130 tablet 0   isosorbide mononitrate (IMDUR) 60 MG 24 hr tablet TAKE 1 TABLET BY MOUTH EVERY DAY 90 tablet 1   lipase/protease/amylase (CREON) 36000 UNITS CPEP capsule Take 3 capsules (108,000 Units total) by mouth 3 (three) times daily with meals. May also take 1 capsule (36,000 Units total) as needed (with snacks). 200 capsule 1   loratadine (CLARITIN) 10 MG tablet Take 10 mg by mouth daily.     metFORMIN (GLUCOPHAGE) 1000 MG tablet TAKE 1 TABLET (1,000 MG TOTAL) BY MOUTH TWICE A DAY WITH FOOD  180 tablet 1   methocarbamol (ROBAXIN) 500 MG tablet TAKE 1 TABLET BY MOUTH EVERY 6 HOURS AS NEEDED FOR MUSCLE SPASM 120 tablet 1   Multiple Vitamin (MULTIVITAMIN) tablet Take 1 tablet by mouth daily.     nitroGLYCERIN (NITROSTAT) 0.4 MG SL tablet Place 1 tablet under the tongue every 5 (five) minutes as needed for chest pain.     ONETOUCH ULTRA TEST test strip SMARTSIG:Via Meter 1-3 Times Daily     pantoprazole (PROTONIX) 40 MG tablet Take 1 tablet (40 mg total) by mouth daily. 90 tablet 1   pioglitazone (ACTOS) 30 MG tablet TAKE 1 TABLET(30 MG) BY MOUTH DAILY 90 tablet 1   Probiotic Product (TRUBIOTICS PO) Take 1 capsule by mouth daily.     promethazine (PHENERGAN) 25 MG tablet TAKE 1 TABLET(25 MG) BY MOUTH EVERY 8 HOURS AS NEEDED FOR NAUSEA OR VOMITING 30 tablet 0   rosuvastatin (CRESTOR) 40 MG tablet TAKE 1 TABLET(40 MG) BY MOUTH EVERY NIGHT 90 tablet 1   sitaGLIPtin (JANUVIA) 100 MG tablet TAKE 1 TABLET BY MOUTH EVERY DAY 90 tablet 1    doxycycline (VIBRA-TABS) 100 MG tablet Take 1 tablet (100 mg total) by mouth 2 (two) times daily. (Patient not taking: Reported on 11/03/2022) 14 tablet 0   pregabalin (LYRICA) 50 MG capsule Take 1 capsule (50 mg total) by mouth 3 (three) times daily. (Patient not taking: Reported on 11/03/2022) 90 capsule 1   Current Facility-Administered Medications on File Prior to Visit  Medication Dose Route Frequency Provider Last Rate Last Admin   triamcinolone acetonide (KENALOG) 10 MG/ML injection 10 mg  10 mg Other Once Regal, Norman S, DPM       Allergies  Allergen Reactions   Gabapentin Other (See Comments)    Brain shakes   Glucotrol [Glipizide] Rash and Other (See Comments)    Red rash   Influenza Vaccines Other (See Comments)    Significant arm soreness requiring 1 year of physical therapy   Latex Rash   Nortriptyline Other (See Comments)    BRAIN SHAKE   Tetanus Toxoid Hives, Swelling, Rash and Other (See Comments)    Swelling to site of injection with rash and fever   Other Itching, Other (See Comments) and Cough    Patient is highly allergic to Cats and she begins to break out in rash and turns red if someone caring for her has cats at home.  Patient takes Claritin for this type of reaction.  Patient stated that if it goes untreated then he begins to have difficulty breathing   Prednisone & Diphenhydramine     Pt get really headaches   Invokana [Canagliflozin] Other (See Comments)    Yeast Infections   Social History   Socioeconomic History   Marital status: Married    Spouse name: Not on file   Number of children: Not on file   Years of education: 10   Highest education level: Not on file  Occupational History   Not on file  Tobacco Use   Smoking status: Former    Current packs/day: 0.00    Types: Cigarettes    Quit date: 12/13/2008    Years since quitting: 13.8   Smokeless tobacco: Never  Vaping Use   Vaping status: Never Used  Substance and Sexual Activity    Alcohol use: No    Comment: Quit drinking around year 2000   Drug use: No    Comment: Patient quit smoking around 2011   Sexual activity: Yes  Partners: Male    Birth control/protection: Post-menopausal  Other Topics Concern   Not on file  Social History Narrative   Denies any IV drug use or marijuana use.  Former smoker for 30 years, quit Nov. 2010. She is married with 2 children. Disabled from knee, not working.   Social Determinants of Health   Financial Resource Strain: Medium Risk (09/28/2018)   Overall Financial Resource Strain (CARDIA)    Difficulty of Paying Living Expenses: Somewhat hard  Food Insecurity: Not on file  Transportation Needs: No Transportation Needs (09/28/2018)   PRAPARE - Administrator, Civil Service (Medical): No    Lack of Transportation (Non-Medical): No  Physical Activity: Not on file  Stress: Not on file  Social Connections: Unknown (09/28/2018)   Social Connection and Isolation Panel [NHANES]    Frequency of Communication with Friends and Family: Once a week    Frequency of Social Gatherings with Friends and Family: Once a week    Attends Religious Services: Not on Marketing executive or Organizations: Not on file    Attends Banker Meetings: Not on file    Marital Status: Married  Intimate Partner Violence: Not At Risk (09/28/2018)   Humiliation, Afraid, Rape, and Kick questionnaire    Fear of Current or Ex-Partner: No    Emotionally Abused: No    Physically Abused: No    Sexually Abused: No   Family History  Problem Relation Age of Onset   CVA Mother    Emphysema Father    Breast cancer Sister 54   Colon cancer Neg Hx      Review of Systems  All other systems reviewed and are negative.      Objective:   Physical Exam Vitals reviewed.  Constitutional:      General: She is not in acute distress.    Appearance: She is well-developed. She is not diaphoretic.  Eyes:     Conjunctiva/sclera:  Conjunctivae normal.  Neck:     Vascular: No carotid bruit or JVD.  Cardiovascular:     Rate and Rhythm: Normal rate and regular rhythm.     Heart sounds: Normal heart sounds. No murmur heard. Pulmonary:     Effort: Pulmonary effort is normal.     Breath sounds: Normal breath sounds. No wheezing or rales.  Chest:     Chest wall: No tenderness.    Abdominal:     General: Bowel sounds are normal. There is no distension.     Palpations: Abdomen is soft. There is no mass.     Tenderness: There is no abdominal tenderness. There is no guarding or rebound.  Musculoskeletal:     Cervical back: Neck supple.     Right lower leg: No edema.     Left lower leg: No edema.  Skin:    Findings: No erythema or rash.  Neurological:     Mental Status: She is alert and oriented to person, place, and time.     Cranial Nerves: No cranial nerve deficit.     Motor: No abnormal muscle tone.     Coordination: Coordination normal.     Deep Tendon Reflexes: Reflexes are normal and symmetric.         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  I anesthetized the lesion with 0.1% lidocaine with epinephrine.  I then prepped and draped the  lesion in sterile fashion.  I made a 2.5 x 2 centimeter elliptical incision around the lesion down to the subcutaneous fat.  I removed the lesion in its entirety.  I then approximated the skin edges with 4 simple interrupted 3-0 Ethilon sutures.  The lesion was sent to pathology in a labeled container.

## 2022-11-04 ENCOUNTER — Emergency Department (HOSPITAL_COMMUNITY)
Admission: EM | Admit: 2022-11-04 | Discharge: 2022-11-04 | Disposition: A | Discharge status: Home / Self Care | Payer: HMO | Attending: Emergency Medicine | Admitting: Emergency Medicine

## 2022-11-04 ENCOUNTER — Other Ambulatory Visit: Payer: Self-pay

## 2022-11-04 ENCOUNTER — Emergency Department (HOSPITAL_COMMUNITY): Payer: HMO

## 2022-11-04 ENCOUNTER — Encounter (HOSPITAL_COMMUNITY): Payer: Self-pay | Admitting: Emergency Medicine

## 2022-11-04 DIAGNOSIS — M160 Bilateral primary osteoarthritis of hip: Secondary | ICD-10-CM | POA: Diagnosis not present

## 2022-11-04 DIAGNOSIS — W010XXA Fall on same level from slipping, tripping and stumbling without subsequent striking against object, initial encounter: Secondary | ICD-10-CM | POA: Diagnosis not present

## 2022-11-04 DIAGNOSIS — Z7982 Long term (current) use of aspirin: Secondary | ICD-10-CM | POA: Insufficient documentation

## 2022-11-04 DIAGNOSIS — W19XXXA Unspecified fall, initial encounter: Secondary | ICD-10-CM | POA: Diagnosis not present

## 2022-11-04 DIAGNOSIS — I10 Essential (primary) hypertension: Secondary | ICD-10-CM | POA: Diagnosis not present

## 2022-11-04 DIAGNOSIS — Z91040 Latex allergy status: Secondary | ICD-10-CM | POA: Insufficient documentation

## 2022-11-04 DIAGNOSIS — R1032 Left lower quadrant pain: Secondary | ICD-10-CM | POA: Diagnosis not present

## 2022-11-04 DIAGNOSIS — M25552 Pain in left hip: Secondary | ICD-10-CM | POA: Diagnosis not present

## 2022-11-04 DIAGNOSIS — M25559 Pain in unspecified hip: Secondary | ICD-10-CM | POA: Diagnosis not present

## 2022-11-04 DIAGNOSIS — S7002XA Contusion of left hip, initial encounter: Secondary | ICD-10-CM | POA: Insufficient documentation

## 2022-11-04 DIAGNOSIS — S79912A Unspecified injury of left hip, initial encounter: Secondary | ICD-10-CM | POA: Diagnosis present

## 2022-11-04 DIAGNOSIS — Y92513 Shop (commercial) as the place of occurrence of the external cause: Secondary | ICD-10-CM | POA: Diagnosis not present

## 2022-11-04 DIAGNOSIS — R0781 Pleurodynia: Secondary | ICD-10-CM | POA: Diagnosis not present

## 2022-11-04 MED ORDER — ACETAMINOPHEN 325 MG PO TABS: 650.0000 mg | ORAL_TABLET | Freq: Once | ORAL | Status: DC

## 2022-11-04 MED ORDER — MELOXICAM 15 MG PO TBDP: 15.0000 mg | ORAL_TABLET | Freq: Every day | ORAL | 0 refills | Status: AC

## 2022-11-04 NOTE — ED Triage Notes (Signed)
Pt arrives via [facility] with reports of mechanical fall while walking out of [employer]. Pt reports left side rib pain and left hip pain. Pt also reports headache. Denies hitting head. Pt taking plavix.

## 2022-11-04 NOTE — ED Provider Triage Note (Signed)
Emergency Medicine Provider Triage Evaluation Note  [name] , a 68 y.o. female  was evaluated in triage.  Pt complains of left hip pain. Patient fell over loose tile at [facility] and fell on her left side and hip. She did not hit her head or lose consciousness. She takes Plavix and Baby Aspirin daily.  Review of Systems  Positive: Left hip pain, headache Negative: LOC, head injury  Physical Exam  BP 139/74 (BP Location: Right Arm)   Pulse 87   Temp 98.4 F (36.9 C)   Resp 18   SpO2 100%  Gen:   Awake, no distress   Resp:  Normal effort  MSK:   Moves extremities without difficulty, able to ambulate Other:  Tenderness to palpation of left hip without bruising  Medical Decision Making  Medically screening exam initiated at 2:37 PM.  Appropriate orders placed.  [name] was informed that the remainder of the evaluation will be completed by another provider, this initial triage assessment does not replace that evaluation, and the importance of remaining in the ED until their evaluation is complete.    Maxwell Marion, PA-C 11/04/22 1441

## 2022-11-04 NOTE — ED Provider Notes (Signed)
[place] EMERGENCY DEPARTMENT AT [facility] Provider Note   CSN: [id number] Arrival date & time: 11/04/22  1319     History  Chief Complaint  Patient presents with   Fall    [name] is a 67 y.o. female.   Fall   67 year old female, history of chronic pain, takes hydrocodone frequently presents after falling in [place] shopping [place], states that she caught her foot on a lip of concrete and fell to the left side landing on her left hip, she has no trouble breathing, she has mild pain over the left lower ribs but no abdominal pain, no back pain, the pain is in her left hip.  She has been able to ambulate with minimal difficulty.  She denies it injuring her head and has no numbness or weakness.  This occurred just prior to arrival    Home Medications Prior to Admission medications   Medication Sig Start Date End Date Taking? Authorizing Provider  Meloxicam 15 MG TBDP Take 15 mg by mouth daily for 14 days. 11/04/22 11/18/22 Yes Eber Hong, MD  acetaminophen (TYLENOL) 500 MG tablet Take 1,000 mg by mouth every 6 (six) hours as needed for moderate pain or headache.    [provider]  albuterol (VENTOLIN HFA) 108 (90 Base) MCG/ACT inhaler Inhale 2 puffs into the lungs every 6 (six) hours as needed for wheezing or shortness of breath. 09/09/22   Donita Brooks, MD  aspirin EC 81 MG tablet Take 1 tablet (81 mg total) by mouth daily. Swallow whole. 09/28/22 09/28/23  Iran Ouch, MD  bisoprolol (ZEBETA) 5 MG tablet TAKE 1/2 TABLET(2.5 MG) BY MOUTH DAILY 08/04/22   Donita Brooks, MD  Calcium Polycarbophil (FIBER-CAPS PO) Take 1 capsule by mouth daily.    [provider]  clopidogrel (PLAVIX) 75 MG tablet TAKE 1 TABLET(75 MG) BY MOUTH DAILY 11/01/22   Donita Brooks, MD  COLLAGEN PO Take 1,000 mg by mouth daily.    [provider]  Cyanocobalamin (VITAMIN B-12) 5000 MCG TBDP Take 5,000 mcg by mouth daily.    [provider]  dapagliflozin propanediol (FARXIGA) 10 MG TABS tablet TAKE 1 TABLET BY MOUTH DAILY BEFORE BREAKFAST. 06/30/22   Donita Brooks, MD  diclofenac sodium (VOLTAREN) 1 % GEL Apply 2 g topically 4 (four) times daily. Patient taking differently: Apply 2 g topically 4 (four) times daily as needed (for hand pain). 05/04/16   Donita Brooks, MD  doxycycline (VIBRA-TABS) 100 MG tablet Take 1 tablet (100 mg total) by mouth 2 (two) times daily. Patient not taking: Reported on 11/03/2022 10/21/22   Donita Brooks, MD  fluticasone-salmeterol (ADVAIR DISKUS) 250-50 MCG/ACT AEPB Inhale 1 puff into the lungs in the morning and at bedtime. 11/02/22   Donita Brooks, MD  HYDROcodone-acetaminophen (NORCO/VICODIN) 5-325 MG tablet Take 1 tablet by mouth every 6 (six) hours as needed for moderate pain. Patient needs to schedule office visit 10/21/22   Donita Brooks, MD  isosorbide mononitrate (IMDUR) 60 MG 24 hr tablet TAKE 1 TABLET BY MOUTH EVERY DAY 08/23/22   Donita Brooks, MD  lipase/protease/amylase (CREON) 36000 UNITS CPEP capsule Take 3 capsules (108,000 Units total) by mouth 3 (three) times daily with meals. May also take 1 capsule (36,000 Units total) as needed (with snacks). 09/20/22   Donita Brooks, MD  loratadine (CLARITIN) 10 MG tablet Take 10 mg by mouth daily.    [provider]  metFORMIN (  GLUCOPHAGE) 1000 MG tablet TAKE 1 TABLET (1,000 MG TOTAL) BY MOUTH TWICE A DAY WITH FOOD 09/20/22   Donita Brooks, MD  methocarbamol (ROBAXIN) 500 MG tablet TAKE 1 TABLET BY MOUTH EVERY 6 HOURS AS NEEDED FOR MUSCLE SPASM 10/21/22   Donita Brooks, MD  Multiple Vitamin (MULTIVITAMIN) tablet Take 1 tablet by mouth daily.    [provider]  nitroGLYCERIN (NITROSTAT) 0.4 MG SL tablet Place 1 tablet under the tongue every 5 (five) minutes as needed for chest pain. 09/26/18   [provider]  Koren Bound TEST test strip SMARTSIG:Via Meter 1-3 Times Daily 07/19/22    [provider]  pantoprazole (PROTONIX) 40 MG tablet Take 1 tablet (40 mg total) by mouth daily. 10/21/22   Donita Brooks, MD  pioglitazone (ACTOS) 30 MG tablet TAKE 1 TABLET(30 MG) BY MOUTH DAILY 08/08/22   Donita Brooks, MD  pregabalin (LYRICA) 50 MG capsule Take 1 capsule (50 mg total) by mouth 3 (three) times daily. Patient not taking: Reported on 11/03/2022 11/01/22   Donita Brooks, MD  Probiotic Product (TRUBIOTICS PO) Take 1 capsule by mouth daily.    [provider]  promethazine (PHENERGAN) 25 MG tablet TAKE 1 TABLET(25 MG) BY MOUTH EVERY 8 HOURS AS NEEDED FOR NAUSEA OR VOMITING 11/01/22   Donita Brooks, MD  rosuvastatin (CRESTOR) 40 MG tablet TAKE 1 TABLET(40 MG) BY MOUTH EVERY NIGHT 08/08/22   Donita Brooks, MD  sitaGLIPtin (JANUVIA) 100 MG tablet TAKE 1 TABLET BY MOUTH EVERY DAY 10/21/22   Donita Brooks, MD      Allergies    Gabapentin, Glucotrol [glipizide], Influenza vaccines, Latex, Nortriptyline, Tetanus toxoid, Other, Prednisone & diphenhydramine, and Invokana [canagliflozin]    Review of Systems   Review of Systems  All other systems reviewed and are negative.   Physical Exam Updated Vital Signs BP (!) 153/76 (BP Location: Right Arm)   Pulse 86   Temp 98.1 F (36.7 C)   Resp 18   SpO2 100%  Physical Exam Vitals and nursing note reviewed.  Constitutional:      General: She is not in acute distress.    Appearance: She is well-developed.  HENT:     Head: Normocephalic and atraumatic.     Mouth/Throat:     Pharynx: No oropharyngeal exudate.  Eyes:     General: No scleral icterus.       Right eye: No discharge.        Left eye: No discharge.     Conjunctiva/sclera: Conjunctivae normal.     Pupils: Pupils are equal, round, and reactive to light.  Neck:     Thyroid: No thyromegaly.     Vascular: No JVD.  Cardiovascular:     Rate and Rhythm: Normal rate and regular rhythm.     Heart sounds: Normal heart sounds. No murmur  heard.    No friction rub. No gallop.  Pulmonary:     Effort: Pulmonary effort is normal. No respiratory distress.     Breath sounds: Normal breath sounds. No wheezing or rales.  Abdominal:     General: Bowel sounds are normal. There is no distension.     Palpations: Abdomen is soft. There is no mass.     Tenderness: There is no abdominal tenderness.  Musculoskeletal:        General: Tenderness present. Normal range of motion.     Cervical back: Normal range of motion and neck supple.  Right lower leg: No edema.     Left lower leg: No edema.     Comments: Totally normal range of motion of the bilateral lower extremities, she has mild tenderness over the left lateral hip over the bursa, she has normal range of motion of the knee and the ankle.  She has good pulses at the feet bilaterally.  She has no tenderness over the left ribs  Lymphadenopathy:     Cervical: No cervical adenopathy.  Skin:    General: Skin is warm and dry.     Findings: No erythema or rash.  Neurological:     Mental Status: She is alert.     Coordination: Coordination normal.  Psychiatric:        Behavior: Behavior normal.     ED Results / Procedures / Treatments   Labs (all labs ordered are listed, but only abnormal results are displayed) Labs Reviewed - No data to display  EKG None  Radiology DG Hip Unilat W or Wo Pelvis 2-3 Views Left  Result Date: 11/04/2022 CLINICAL DATA:  Fall.  Left-sided pain. EXAM: DG HIP (WITH OR WITHOUT PELVIS) 2-3V LEFT COMPARISON:  None Available. FINDINGS: Pelvis is intact with normal and symmetric sacroiliac joints. No acute fracture or dislocation. No aggressive osseous lesion. Visualized sacral arcuate lines are unremarkable. Unremarkable symphysis pubis. There are mild degenerative changes of bilateral hip joints without significant joint space narrowing. Osteophytosis of the superior acetabulum. No radiopaque foreign bodies. Right proximal thigh vascular stent noted.  IMPRESSION: 1. No acute osseous abnormalities. 2. 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 Electronically Signed   By: Jules Schick M.D.   On: 11/04/2022 16:25    Procedures Procedures    Medications Ordered in ED Medications  acetaminophen (TYLENOL) tablet 650 mg (has no administration in time range)    ED Course/ Medical Decision Making/ A&P                                 Medical Decision Making Amount and/or Complexity of Data Reviewed Radiology: ordered.  Risk OTC drugs. Prescription drug management.   Overall patient well-appearing, vitals unremarkable, no hypoxia or tachypnea, no pain with breathing, minimal tenderness over the left chest wall.  Will proceed with imaging of the hip,  I personally viewed and interpreted the imaging of the left hip which shows that there is no fractures or dislocations of the hip or pelvis.  There does appear to be mild arthritis.  I have discussed with the patient at the bedside the results, and the meaning of these results.  They have had opportunity to ask questions,  expressed their understanding to the need for follow-up with primary care physician  Home with an anti-inflammatory and RICE therapy        Final Clinical Impression(s) / ED Diagnoses Final diagnoses:  Contusion of left hip, initial encounter    Rx / DC Orders ED Discharge Orders          Ordered    Meloxicam 15 MG TBDP  Daily        11/04/22 2101              Eber Hong, MD 11/04/22 2103

## 2022-11-04 NOTE — Discharge Instructions (Signed)
Your x-ray shows no broken bones, mild arthritis, Mobic once a day to help for pain, see your doctor in 3 days if no better  Thank you for allowing [place] to treat you in the emergency department today.  After reviewing your examination and potential testing that was done it appears that you are safe to go home.  I would like for you to follow-up with your doctor within the next several days, have them obtain your records and follow-up with them to review all potential tests and results from your visit.  If you should develop severe or worsening symptoms return to the emergency department immediately  RICE therapy:  Apply ice wrapped in a towel intermittently keeping it on the skin no longer than 10 minutes a couple of times an hour  Elevate the affected extremity to help reduce blood flow and prevent swelling  Use an anti-inflammatory if you are not allergic to it such as ibuprofen or Naprosyn to help with pain and swelling  Use a compressive device whether it is an Ace wrap or other  immobilizer to help minimize movement and compress the swelling.

## 2022-11-05 ENCOUNTER — Other Ambulatory Visit: Payer: Self-pay | Admitting: Family Medicine

## 2022-11-05 DIAGNOSIS — I25118 Atherosclerotic heart disease of native coronary artery with other forms of angina pectoris: Secondary | ICD-10-CM

## 2022-11-05 DIAGNOSIS — E1159 Type 2 diabetes mellitus with other circulatory complications: Secondary | ICD-10-CM

## 2022-11-07 ENCOUNTER — Telehealth: Payer: Self-pay | Admitting: Family Medicine

## 2022-11-07 LAB — PATHOLOGY REPORT

## 2022-11-07 LAB — TISSUE SPECIMEN

## 2022-11-07 NOTE — Telephone Encounter (Signed)
Requested Prescriptions  Refused Prescriptions Disp Refills   FARXIGA 10 MG TABS tablet [Pharmacy Med Name: FARXIGA 10 MG TABLET] 90 tablet 1    Sig: TAKE 1 TABLET BY MOUTH EVERY DAY BEFORE BREAKFAST     Endocrinology:  Diabetes - SGLT2 Inhibitors Failed - 11/03/2022  4:24 PM      Failed - Valid encounter within last 6 months    Recent Outpatient Visits           1 year ago Type 2 diabetes mellitus with diabetic neuropathy, unspecified whether long term insulin use (HCC)   [facility] Pickard, Priscille Heidelberg, MD   2 years ago Type 2 diabetes mellitus with diabetic neuropathy, unspecified whether long term insulin use (HCC)   [facility] Pickard, Priscille Heidelberg, MD   2 years ago Gynecologic exam normal   [facility] Valentino Nose, NP   2 years ago Type 2 diabetes mellitus with other circulatory complication, without long-term current use of insulin (HCC)   [facility] Pickard, Priscille Heidelberg, MD   2 years ago Injury of left toe, initial encounter   [facility] [place], Velna Hatchet, MD       Future Appointments             In 3 days Pickard, Priscille Heidelberg, MD [facility]            Passed - Cr in normal range and within 360 days    Creat  Date Value Ref Range Status  10/21/2022 1.04 0.50 - 1.05 mg/dL Final   Creatinine, Urine  Date Value Ref Range Status  06/03/2022 33 20 - 275 mg/dL Final         Passed - HBA1C is between 0 and 7.9 and within 180 days    Hgb A1c MFr Bld  Date Value Ref Range Status  10/21/2022 7.5 (H) <5.7 % of total Hgb Final    Comment:    For someone without known diabetes, a hemoglobin A1c value of 6.5% or greater indicates that they may have  diabetes and this should be confirmed with a follow-up  test. . For someone with known diabetes, a value <7% indicates  that their diabetes is well controlled and a value  greater than or equal to 7%  indicates suboptimal  control. A1c targets should be individualized based on  duration of diabetes, age, comorbid conditions, and  other considerations. . Currently, no consensus exists regarding use of hemoglobin A1c for diagnosis of diabetes for children. .          Passed - eGFR in normal range and within 360 days    GFR, Est African American  Date Value Ref Range Status  04/03/2020 69 > OR = 60 mL/min/1.11m2 Final   GFR, Est Non African American  Date Value Ref Range Status  04/03/2020 59 (L) > OR = 60 mL/min/1.52m2 Final   GFR, Estimated  Date Value Ref Range Status  06/30/2021 58 (L) >60 mL/min Final    Comment:    (NOTE) Calculated using the CKD-EPI Creatinine Equation (2021)    GFR  Date Value Ref Range Status  12/07/2017 59.28 (L) >60.00 mL/min Final   eGFR  Date Value Ref Range Status  10/21/2022 60 > OR = 60 mL/min/1.6m2 Final  08/30/2022 62 >59 mL/min/1.73 Final

## 2022-11-07 NOTE — Telephone Encounter (Signed)
Patient just called to follow up on fall at [facility] last Friday, 11/04/22. Patient stated the provider who examined her advised her to follow up within 3 days with her pcp which is today; schedule already full. Patient asked if she can just wait until her appointment this Thursday. As per nurse, patient advised to come in for soonest available which is tomorrow morning. Appointment scheduled.

## 2022-11-07 NOTE — Telephone Encounter (Signed)
Last OV 10/21/22 Requested Prescriptions  Pending Prescriptions Disp Refills   rosuvastatin (CRESTOR) 40 MG tablet [Pharmacy Med Name: ROSUVASTATIN CALCIUM 40 MG TAB] 90 tablet 1    Sig: TAKE 1 TABLET(40 MG) BY MOUTH EVERY NIGHT     Cardiovascular:  Antilipid - Statins 2 Failed - 11/05/2022  9:58 AM      Failed - Valid encounter within last 12 months    Recent Outpatient Visits           1 year ago Type 2 diabetes mellitus with diabetic neuropathy, unspecified whether long term insulin use (HCC)   [facility] Pickard, Priscille Heidelberg, MD   2 years ago Type 2 diabetes mellitus with diabetic neuropathy, unspecified whether long term insulin use (HCC)   [facility] Pickard, Priscille Heidelberg, MD   2 years ago Gynecologic exam normal   [facility] Cathlean Marseilles A, NP   2 years ago Type 2 diabetes mellitus with other circulatory complication, without long-term current use of insulin (HCC)   [facility] Pickard, Priscille Heidelberg, MD   2 years ago Injury of left toe, initial encounter   [facility] [place], Velna Hatchet, MD       Future Appointments             In 3 days Tanya Nones, Priscille Heidelberg, MD [facility]            Failed - Lipid Panel in normal range within the last 12 months    Cholesterol  Date Value Ref Range Status  10/21/2022 154 <200 mg/dL Final   LDL Cholesterol (Calc)  Date Value Ref Range Status  10/21/2022 70 mg/dL (calc) Final    Comment:    Reference range: <100 . Desirable range <100 mg/dL for primary prevention;   <70 mg/dL for patients with CHD or diabetic patients  with > or = 2 CHD risk factors. Marland Kitchen LDL-C is now calculated using the Martin-Hopkins  calculation, which is a validated novel method providing  better accuracy than the Friedewald equation in the  estimation of LDL-C.  Horald Pollen et al. Lenox Ahr. 4540;981(19): 2061-2068   (http://education.[employer].com/faq/FAQ164)    Direct LDL  Date Value Ref Range Status  01/02/2014 50.6 mg/dL Final    Comment:    Optimal:  <100 mg/dLNear or Above Optimal:  100-129 mg/dLBorderline High:  130-159 mg/dLHigh:  160-189 mg/dLVery High:  >190 mg/dL   HDL  Date Value Ref Range Status  10/21/2022 44 (L) > OR = 50 mg/dL Final   Triglycerides  Date Value Ref Range Status  10/21/2022 331 (H) <150 mg/dL Final    Comment:    . If a non-fasting specimen was collected, consider repeat triglyceride testing on a fasting specimen if clinically indicated.  Perry Mount et al. J. of Clin. Lipidol. 2015;9:129-169. Marland Kitchen          Passed - Cr in normal range and within 360 days    Creat  Date Value Ref Range Status  10/21/2022 1.04 0.50 - 1.05 mg/dL Final   Creatinine, Urine  Date Value Ref Range Status  06/03/2022 33 20 - 275 mg/dL Final         Passed - Patient is not pregnant

## 2022-11-08 ENCOUNTER — Ambulatory Visit: Payer: HMO | Admitting: Family Medicine

## 2022-11-10 ENCOUNTER — Encounter: Payer: Self-pay | Admitting: Family Medicine

## 2022-11-10 ENCOUNTER — Ambulatory Visit: Payer: HMO | Admitting: Family Medicine

## 2022-11-10 VITALS — BP 120/70 | HR 89 | Temp 97.6°F | Ht 62.0 in | Wt 204.6 lb

## 2022-11-10 DIAGNOSIS — Z4802 Encounter for removal of sutures: Secondary | ICD-10-CM

## 2022-11-10 NOTE — Progress Notes (Signed)
Subjective:    Patient ID: [name], female    DOB: [birth year], 67 y.o.   MRN: [id number]  Please see my last office visit.  I removed a lesion in the center of her chest.  The biopsy revealed atypical squamous cell proliferation.  Thankfully the margins were clear of any residual lesion.  She is here today for suture removal  Past Medical History:  Diagnosis Date   Aortic atherosclerosis (HCC)    Asthma    CAD (coronary artery disease)    a. LHC 2011: Diffuse distal and branch vessel disease - patient managed medically, no interventional options. b. Nuc 03/2014 - low risk, no ischemia.   Complication of anesthesia    slow to wake up with last surgery in 2011    Diabetes mellitus with circulatory complication (HCC)    Diverticulosis    Dyslipidemia    GERD (gastroesophageal reflux disease)    Headache    hx of migraines    Hypertension    Osteoarthritis    severe right knee  R TKR   PAD (peripheral artery disease) (HCC)    a. Severe stenosis mid right SFA s/p atherectomy 03/25/09. b. peripheral angiography in 12/2011 which showed only about 50% diffuse right SFA stenosis.   Pancreatitis 2003   Renal artery stenosis (HCC)    a. LHC 2011 - 40-50% left RAS.   Tobacco use disorder    quit 11/10   Past Surgical History:  Procedure Laterality Date   ABDOMINAL AORTAGRAM N/A 12/21/2011   Procedure: ABDOMINAL AORTAGRAM;  Surgeon: Iran Ouch, MD;  Location: [facility] CATH [facility];  Service: Cardiovascular;  Laterality: N/A;   ABDOMINAL AORTOGRAM W/LOWER EXTREMITY Bilateral 05/08/2019   Procedure: ABDOMINAL AORTOGRAM W/LOWER EXTREMITY;  Surgeon: Iran Ouch, MD;  Location: [facility] INVASIVE CV LAB;  Service: Cardiovascular;  Laterality: Bilateral;   ABDOMINAL AORTOGRAM W/LOWER EXTREMITY N/A 09/28/2022   Procedure: ABDOMINAL AORTOGRAM W/LOWER EXTREMITY;  Surgeon: Iran Ouch, MD;  Location: [facility] INVAS[facility] CV LAB;  Service: Cardiovascular;  Laterality: N/A;   CARDIAC CATHETERIZATION      ENDARTERECTOMY FEMORAL Right 10/10/2019   Procedure: RIGHT FEMORAL ENDARTERECTOMY;  Surgeon: Cephus Shelling, MD;  Location: [facility] OR;  Service: Vascular;  Laterality: Right;   ESOPHAGOGASTRODUODENOSCOPY (EGD) WITH PROPOFOL N/A 10/05/2017   Procedure: ESOPHAGOGASTRODUODENOSCOPY (EGD) WITH PROPOFOL;  Surgeon: Rachael Fee, MD;  Location: [facility];  Service: Endoscopy;  Laterality: N/A;   EUS N/A 10/05/2017   Procedure: UPPER ENDOSCOPIC ULTRASOUND (EUS) RADIAL;  Surgeon: Rachael Fee, MD;  Location: [facility];  Service: Endoscopy;  Laterality: N/A;   FOOT SURGERY Left    LOWER EXTREMITY ANGIOGRAM Bilateral 05/27/2015   Procedure: Lower Extremity Angiogram;  Surgeon: Iran Ouch, MD;  Location: [facility];  Service: Cardiovascular;  Laterality: Bilateral;   PATCH ANGIOPLASTY Right 10/10/2019   Procedure: PATCH ANGIOPLASTY USING Livia Snellen BIOLOGIC PATCH;  Surgeon: Cephus Shelling, MD;  Location: [facility] OR;  Service: Vascular;  Laterality: Right;   PERIPHERAL INTRAVASCULAR LITHOTRIPSY  09/28/2022   Procedure: PERIPHERAL INTRAVASCULAR LITHOTRIPSY;  Surgeon: Iran Ouch, MD;  Location: [facility];  Service: Cardiovascular;;   PERIPHERAL VASCULAR ATHERECTOMY Left 05/08/2019   Procedure: PERIPHERAL VASCULAR ATHERECTOMY;  Surgeon: Iran Ouch, MD;  [facility]: [facility];  Service: Cardiovascular;  Laterality: Left;   PERIPHERAL VASCULAR CATHETERIZATION N/A 05/27/2015   Procedure: Abdominal Aortogram;  Surgeon: Iran Ouch, MD;  [facility]: [facility];  Service: Cardiovascular;  Laterality: N/A;   PERIPHERAL VASCULAR CATHETERIZATION Right 05/27/2015   Procedure: Peripheral Vascular Intervention;  Surgeon: Iran Ouch, MD;  Location: [facility];  Service: Cardiovascular;  Laterality: Right;  SFA   PERIPHERAL VASCULAR INTERVENTION  09/28/2022   Procedure: PERIPHERAL VASCULAR INTERVENTION;  Surgeon: Iran Ouch, MD;  Location: [facility]  INVASIVE CV LAB;  Service: Cardiovascular;;   TOTAL KNEE ARTHROPLASTY Right 2011   TOTAL KNEE ARTHROPLASTY Left 05/22/2014   Procedure: LEFT TOTAL KNEE ARTHROPLASTY;  Surgeon: Ranee Gosselin, MD;  Location: [facility]S;  Service: Orthopedics;  Laterality: Left;   TRIGGER FINGER RELEASE Left 07/07/2021   Procedure: LEFT INDEX FINGER RELEASE TRIGGER FINGER/A-1 PULLEY / CARPOMETACARPAL INJECTION;  Surgeon: Marlyne Beards, MD;  Location: [facility];  Service: Orthopedics;  Laterality: Left;   TUBAL LIGATION  1980   Current Outpatient Medications on File Prior to Visit  Medication Sig Dispense Refill   acetaminophen (TYLENOL) 500 MG tablet Take 1,000 mg by mouth every 6 (six) hours as needed for moderate pain or headache.     albuterol (VENTOLIN HFA) 108 (90 Base) MCG/ACT inhaler Inhale 2 puffs into the lungs every 6 (six) hours as needed for wheezing or shortness of breath. 8.5 each 3   aspirin EC 81 MG tablet Take 1 tablet (81 mg total) by mouth daily. Swallow whole.     bisoprolol (ZEBETA) 5 MG tablet TAKE 1/2 TABLET(2.5 MG) BY MOUTH DAILY 45 tablet 3   Calcium Polycarbophil (FIBER-CAPS PO) Take 1 capsule by mouth daily.     clopidogrel (PLAVIX) 75 MG tablet TAKE 1 TABLET(75 MG) BY MOUTH DAILY 90 tablet 1   COLLAGEN PO Take 1,000 mg by mouth daily.     Cyanocobalamin (VITAMIN B-12) 5000 MCG TBDP Take 5,000 mcg by mouth daily.     dapagliflozin propanediol (FARXIGA) 10 MG TABS tablet TAKE 1 TABLET BY MOUTH DAILY BEFORE BREAKFAST. 90 tablet 1   diclofenac sodium (VOLTAREN) 1 % GEL Apply 2 g topically 4 (four) times daily. (Patient taking differently: Apply 2 g topically 4 (four) times daily as needed (for hand pain).) 100 g 0   doxycycline (VIBRA-TABS) 100 MG tablet Take 1 tablet (100 mg total) by mouth 2 (two) times daily. 14 tablet 0   fluticasone-salmeterol (ADVAIR DISKUS) 250-50 MCG/ACT AEPB Inhale 1 puff into the lungs in the morning and at bedtime. 60 each 11    HYDROcodone-acetaminophen (NORCO/VICODIN) 5-325 MG tablet Take 1 tablet by mouth every 6 (six) hours as needed for moderate pain. Patient needs to schedule office visit 130 tablet 0   isosorbide mononitrate (IMDUR) 60 MG 24 hr tablet TAKE 1 TABLET BY MOUTH EVERY DAY 90 tablet 1   lipase/protease/amylase (CREON) 36000 UNITS CPEP capsule Take 3 capsules (108,000 Units total) by mouth 3 (three) times daily with meals. May also take 1 capsule (36,000 Units total) as needed (with snacks). 200 capsule 1   loratadine (CLARITIN) 10 MG tablet Take 10 mg by mouth daily.     Meloxicam 15 MG TBDP Take 15 mg by mouth daily for 14 days. 14 tablet 0   metFORMIN (GLUCOPHAGE) 1000 MG tablet TAKE 1 TABLET (1,000 MG TOTAL) BY MOUTH TWICE A DAY WITH FOOD 180 tablet 1   methocarbamol (ROBAXIN) 500 MG tablet TAKE 1 TABLET BY MOUTH EVERY 6 HOURS AS NEEDED FOR MUSCLE SPASM 120 tablet 1   Multiple Vitamin (MULTIVITAMIN) tablet Take 1 tablet by mouth daily.     nitroGLYCERIN (NITROSTAT) 0.4 MG SL tablet Place  1 tablet under the tongue every 5 (five) minutes as needed for chest pain.     ONETOUCH ULTRA TEST test strip SMARTSIG:Via Meter 1-3 Times Daily     pantoprazole (PROTONIX) 40 MG tablet Take 1 tablet (40 mg total) by mouth daily. 90 tablet 1   pioglitazone (ACTOS) 30 MG tablet TAKE 1 TABLET(30 MG) BY MOUTH DAILY 90 tablet 1   pregabalin (LYRICA) 50 MG capsule Take 1 capsule (50 mg total) by mouth 3 (three) times daily. 90 capsule 1   Probiotic Product (TRUBIOTICS PO) Take 1 capsule by mouth daily.     promethazine (PHENERGAN) 25 MG tablet TAKE 1 TABLET(25 MG) BY MOUTH EVERY 8 HOURS AS NEEDED FOR NAUSEA OR VOMITING 30 tablet 0   rosuvastatin (CRESTOR) 40 MG tablet TAKE 1 TABLET(40 MG) BY MOUTH EVERY NIGHT 90 tablet 1   sitaGLIPtin (JANUVIA) 100 MG tablet TAKE 1 TABLET BY MOUTH EVERY DAY 90 tablet 1   Current Facility-Administered Medications on File Prior to Visit  Medication Dose Route Frequency Provider Last Rate  Last Admin   triamcinolone acetonide (KENALOG) 10 MG/ML injection 10 mg  10 mg Other Once Regal, Norman S, DPM       Allergies  Allergen Reactions   Gabapentin Other (See Comments)    Brain shakes   Glucotrol [Glipizide] Rash and Other (See Comments)    Red rash   Influenza Vaccines Other (See Comments)    Significant arm soreness requiring 1 year of physical therapy   Latex Rash   Nortriptyline Other (See Comments)    BRAIN SHAKE   Tetanus Toxoid Hives, Swelling, Rash and Other (See Comments)    Swelling to site of injection with rash and fever   Other Itching, Other (See Comments) and Cough    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  Patient takes Claritin for this type of reaction.  Patient stated that if it goes untreated then he begins to have difficulty breathing   Prednisone & Diphenhydramine     Pt get really headaches   Invokana [Canagliflozin] Other (See Comments)    Yeast Infections   Social History   Socioeconomic History   Marital status: Married    Spouse name: Not on file   Number of children: Not on file   Years of education: 10   Highest education level: Not on file  Occupational History   Not on file  Tobacco Use   Smoking status: Former    Current packs/day: 0.00    Types: Cigarettes    Quit date: 12/13/2008    Years since quitting: 13.9   Smokeless tobacco: Never  Vaping Use   Vaping status: Never Used  Substance and Sexual Activity   Alcohol use: No    Comment: Quit drinking around year 2000   Drug use: No    Comment: Patient quit smoking around 2011   Sexual activity: Yes    Partners: Male    Birth control/protection: Post-menopausal  Other Topics Concern   Not on file  Social History Narrative   Denies any IV drug use or marijuana use.  Former smoker for 30 years, quit Nov. 2010. She is married with 2 children. Disabled from knee, not working.   Social  Determinants of Health   Financial Resource Strain: Medium Risk (09/28/2018)   Overall Financial Resource Strain (CARDIA)    Difficulty of Paying Living Expenses:  Somewhat hard  Food Insecurity: Not on file  Transportation Needs: No Transportation Needs (09/28/2018)   PRAPARE - Administrator, Civil Service (Medical): No    Lack of Transportation (Non-Medical): No  Physical Activity: Not on file  Stress: Not on file  Social Connections: Unknown (09/28/2018)   Social Connection and Isolation Panel [NHANES]    Frequency of Communication with Friends and Family: Once a week    Frequency of Social Gatherings with Friends and Family: Once a week    Attends Religious Services: Not on Marketing executive or Organizations: Not on file    Attends Banker Meetings: Not on file    Marital Status: Married  Intimate Partner Violence: Not At Risk (09/28/2018)   Humiliation, Afraid, Rape, and Kick questionnaire    Fear of Current or Ex-Partner: No    Emotionally Abused: No    Physically Abused: No    Sexually Abused: No   Family History  Problem Relation Age of Onset   CVA Mother    Emphysema Father    Breast cancer Sister 7   Colon cancer Neg Hx      Review of Systems  All other systems reviewed and are negative.      Objective:   Physical Exam Vitals reviewed.  Constitutional:      General: She is not in acute distress.    Appearance: She is well-developed. She is not diaphoretic.  Eyes:     Conjunctiva/sclera: Conjunctivae normal.  Neck:     Vascular: No carotid bruit or JVD.  Cardiovascular:     Rate and Rhythm: Normal rate and regular rhythm.     Heart sounds: Normal heart sounds. No murmur heard. Pulmonary:     Effort: Pulmonary effort is normal.     Breath sounds: Normal breath sounds. No wheezing or rales.  Chest:     Chest wall: No tenderness.    Abdominal:     General: Bowel sounds are normal. There is no distension.      Palpations: Abdomen is soft. There is no mass.     Tenderness: There is no abdominal tenderness. There is no guarding or rebound.  Musculoskeletal:     Cervical back: Neck supple.     Right lower leg: No edema.     Left lower leg: No edema.  Skin:    Findings: No erythema or rash.  Neurological:     Mental Status: She is alert and oriented to person, place, and time.     Cranial Nerves: No cranial nerve deficit.     Motor: No abnormal muscle tone.     Coordination: Coordination normal.     Deep Tendon Reflexes: Reflexes are normal and symmetric.         Assessment & Plan:  Visit for suture removal I removed all 4 sutures without difficulty.  I reinforced the skin with Steri-Strips.  Biopsy shows clear margins.  No follow-up is necessary for this condition unless arise.

## 2022-11-14 ENCOUNTER — Other Ambulatory Visit: Payer: Self-pay | Admitting: Family Medicine

## 2022-11-14 ENCOUNTER — Other Ambulatory Visit: Payer: Self-pay | Admitting: Cardiovascular Disease

## 2022-11-14 DIAGNOSIS — E1159 Type 2 diabetes mellitus with other circulatory complications: Secondary | ICD-10-CM

## 2022-11-15 NOTE — Telephone Encounter (Signed)
Requested by interface surescripts. Last OV 10/21/22.  Requested Prescriptions  Pending Prescriptions Disp Refills   FARXIGA 10 MG TABS tablet [Pharmacy Med Name: FARXIGA 10 MG TABLET] 90 tablet 1    Sig: TAKE 1 TABLET BY MOUTH EVERY DAY BEFORE BREAKFAST     Endocrinology:  Diabetes - SGLT2 Inhibitors Failed - 11/14/2022  7:33 PM      Failed - Valid encounter within last 6 months    Recent Outpatient Visits           1 year ago Type 2 diabetes mellitus with diabetic neuropathy, unspecified whether long term insulin use (HCC)   [facility] [place], Priscille Heidelberg, MD   2 years ago Type 2 diabetes mellitus with diabetic neuropathy, unspecified whether long term insulin use (HCC)   [facility] [place], Priscille Heidelberg, MD   2 years ago Gynecologic exam normal   [facility] Valentino Nose, NP   2 years ago Type 2 diabetes mellitus with other circulatory complication, without long-term current use of insulin (HCC)   [facility] [place], Priscille Heidelberg, MD   2 years ago Injury of left toe, initial encounter   [facility] [place], Velna Hatchet, MD              Passed - Cr in normal range and within 360 days    Creat  Date Value Ref Range Status  10/21/2022 1.04 0.50 - 1.05 mg/dL Final   Creatinine, Urine  Date Value Ref Range Status  06/03/2022 33 20 - 275 mg/dL Final         Passed - HBA1C is between 0 and 7.9 and within 180 days    Hgb A1c MFr Bld  Date Value Ref Range Status  10/21/2022 7.5 (H) <5.7 % of total Hgb Final    Comment:    For someone without known diabetes, a hemoglobin A1c value of 6.5% or greater indicates that they may have  diabetes and this should be confirmed with a follow-up  test. . For someone with known diabetes, a value <7% indicates  that their diabetes is well controlled and a value  greater than or equal to 7% indicates suboptimal  control. A1c targets should be individualized  based on  duration of diabetes, age, comorbid conditions, and  other considerations. . Currently, no consensus exists regarding use of hemoglobin A1c for diagnosis of diabetes for children. .          Passed - eGFR in normal range and within 360 days    GFR, Est African American  Date Value Ref Range Status  04/03/2020 69 > OR = 60 mL/min/1.55m2 Final   GFR, Est Non African American  Date Value Ref Range Status  04/03/2020 59 (L) > OR = 60 mL/min/1.4m2 Final   GFR, Estimated  Date Value Ref Range Status  06/30/2021 58 (L) >60 mL/min Final    Comment:    (NOTE) Calculated using the CKD-EPI Creatinine Equation (2021)    GFR  Date Value Ref Range Status  12/07/2017 59.28 (L) >60.00 mL/min Final   eGFR  Date Value Ref Range Status  10/21/2022 60 > OR = 60 mL/min/1.74m2 Final  08/30/2022 62 >59 mL/min/1.73 Final          pioglitazone (ACTOS) 30 MG tablet [Pharmacy Med Name: PIOGLITAZONE HCL 30 MG TABLET] 90 tablet 1    Sig: TAKE 1 TABLET BY MOUTH EVERY DAY     Endocrinology:  Diabetes - Glitazones - pioglitazone Failed - 11/14/2022  7:33 PM      Failed - Valid encounter within last 6 months    Recent Outpatient Visits           1 year ago Type 2 diabetes mellitus with diabetic neuropathy, unspecified whether long term insulin use (HCC)   [facility] [place], Priscille Heidelberg, MD   2 years ago Type 2 diabetes mellitus with diabetic neuropathy, unspecified whether long term insulin use (HCC)   [facility] [place], Priscille Heidelberg, MD   2 years ago Gynecologic exam normal   [facility] Valentino Nose, NP   2 years ago Type 2 diabetes mellitus with other circulatory complication, without long-term current use of insulin (HCC)   [facility] [place], Priscille Heidelberg, MD   2 years ago Injury of left toe, initial encounter   [facility] [place], Velna Hatchet, MD              Passed - HBA1C is between 0  and 7.9 and within 180 days    Hgb A1c MFr Bld  Date Value Ref Range Status  10/21/2022 7.5 (H) <5.7 % of total Hgb Final    Comment:    For someone without known diabetes, a hemoglobin A1c value of 6.5% or greater indicates that they may have  diabetes and this should be confirmed with a follow-up  test. . For someone with known diabetes, a value <7% indicates  that their diabetes is well controlled and a value  greater than or equal to 7% indicates suboptimal  control. A1c targets should be individualized based on  duration of diabetes, age, comorbid conditions, and  other considerations. . Currently, no consensus exists regarding use of hemoglobin A1c for diagnosis of diabetes for children. Marland Kitchen

## 2022-11-17 ENCOUNTER — Telehealth: Payer: Self-pay

## 2022-11-17 NOTE — Telephone Encounter (Signed)
Pt called in to request a refill of this med:  HYDROcodone-acetaminophen (NORCO/VICODIN) 5-325 MG tablet [[id number]]  Pharmacy: [facility]  LOV: 11/10/22

## 2022-11-18 ENCOUNTER — Other Ambulatory Visit: Payer: Self-pay | Admitting: Family Medicine

## 2022-11-18 MED ORDER — HYDROCODONE-ACETAMINOPHEN 5-325 MG PO TABS: 1.0000 | ORAL_TABLET | Freq: Four times a day (QID) | ORAL | 0 refills | Status: DC | PRN

## 2022-11-21 ENCOUNTER — Other Ambulatory Visit: Payer: Self-pay | Admitting: Family Medicine

## 2022-11-21 DIAGNOSIS — I739 Peripheral vascular disease, unspecified: Secondary | ICD-10-CM

## 2022-11-21 DIAGNOSIS — M79604 Pain in right leg: Secondary | ICD-10-CM

## 2022-11-22 ENCOUNTER — Other Ambulatory Visit (HOSPITAL_COMMUNITY): Payer: Self-pay

## 2022-11-22 NOTE — Telephone Encounter (Signed)
Requested medication (s) are due for refill today:   Requested medication (s) are on the active medication list: Yes  Last refill:  10/21/22  Future visit scheduled: No  Notes to clinic:  Not delegated.    Requested Prescriptions  Pending Prescriptions Disp Refills   methocarbamol (ROBAXIN) 500 MG tablet [Pharmacy Med Name: METHOCARBAMOL 500 MG TABLET] 120 tablet 1    Sig: TAKE 1 TABLET BY MOUTH EVERY 6 HOURS AS NEEDED FOR MUSCLE SPASM     Not Delegated - Analgesics:  Muscle Relaxants Failed - 11/21/2022  4:32 PM      Failed - This refill cannot be delegated      Failed - Valid encounter within last 6 months    Recent Outpatient Visits           1 year ago Type 2 diabetes mellitus with diabetic neuropathy, unspecified whether long term insulin use (HCC)   [facility] [place], Priscille Heidelberg, MD   2 years ago Type 2 diabetes mellitus with diabetic neuropathy, unspecified whether long term insulin use (HCC)   [facility] [place], Priscille Heidelberg, MD   2 years ago Gynecologic exam normal   [facility] Cathlean Marseilles A, NP   2 years ago Type 2 diabetes mellitus with other circulatory complication, without long-term current use of insulin (HCC)   [facility] [place], Priscille Heidelberg, MD   2 years ago Injury of left toe, initial encounter   [facility] [place], Velna Hatchet, MD

## 2022-12-02 ENCOUNTER — Other Ambulatory Visit: Payer: Self-pay | Admitting: Family Medicine

## 2022-12-02 DIAGNOSIS — K861 Other chronic pancreatitis: Secondary | ICD-10-CM

## 2022-12-02 NOTE — Telephone Encounter (Signed)
Requested medication (s) are due for refill today: yes  Requested medication (s) are on the active medication list: yes  Last refill:  09/20/22  Future visit scheduled: no  Notes to clinic:  Medication not assigned to a protocol, review manually.      Requested Prescriptions  Pending Prescriptions Disp Refills   lipase/protease/amylase (CREON) 36000 UNITS CPEP capsule 200 capsule 1    Sig: Take 3 capsules (108,000 Units total) by mouth 3 (three) times daily with meals. May also take 1 capsule (36,000 Units total) as needed (with snacks).     Off-Protocol Failed - 12/02/2022 12:29 PM      Failed - Medication not assigned to a protocol, review manually.      Failed - Valid encounter within last 12 months    Recent Outpatient Visits           1 year ago Type 2 diabetes mellitus with diabetic neuropathy, unspecified whether long term insulin use (HCC)   [facility] Donita Brooks, MD   2 years ago Type 2 diabetes mellitus with diabetic neuropathy, unspecified whether long term insulin use (HCC)   [facility] [place], Priscille Heidelberg, MD   2 years ago Gynecologic exam normal   [facility] Cathlean Marseilles A, NP   2 years ago Type 2 diabetes mellitus with other circulatory complication, without long-term current use of insulin (HCC)   [facility] [place], Priscille Heidelberg, MD   2 years ago Injury of left toe, initial encounter   [facility] [place], Velna Hatchet, MD

## 2022-12-04 ENCOUNTER — Other Ambulatory Visit: Payer: Self-pay | Admitting: Cardiovascular Disease

## 2022-12-04 ENCOUNTER — Other Ambulatory Visit: Payer: Self-pay | Admitting: Family Medicine

## 2022-12-06 ENCOUNTER — Telehealth: Payer: Self-pay

## 2022-12-06 ENCOUNTER — Other Ambulatory Visit (HOSPITAL_COMMUNITY): Payer: Self-pay

## 2022-12-06 ENCOUNTER — Other Ambulatory Visit: Payer: Self-pay

## 2022-12-06 DIAGNOSIS — K861 Other chronic pancreatitis: Secondary | ICD-10-CM

## 2022-12-06 MED ORDER — PANCRELIPASE (LIP-PROT-AMYL) 36000-114000 UNITS PO CPEP
108000.0000 [IU] | ORAL_CAPSULE | Freq: Three times a day (TID) | ORAL | 3 refills | Status: DC
Start: 2022-12-06 — End: 2023-02-17

## 2022-12-06 NOTE — Telephone Encounter (Signed)
Requested medication (s) are due for refill today: yes  Requested medication (s) are on the active medication list: yes  Last refill:  11/01/22  Future visit scheduled: no  Notes to clinic:  Unable to refill per protocol, cannot delegate.      Requested Prescriptions  Pending Prescriptions Disp Refills   promethazine (PHENERGAN) 25 MG tablet [Pharmacy Med Name: PROMETHAZINE 25 MG TABLET] 30 tablet 0    Sig: TAKE 1 TABLET(25 MG) BY MOUTH EVERY 8 HOURS AS NEEDED FOR NAUSEA OR VOMITING     Not Delegated - Gastroenterology: Antiemetics Failed - 12/04/2022  7:03 PM      Failed - This refill cannot be delegated      Failed - Valid encounter within last 6 months    Recent Outpatient Visits           1 year ago Type 2 diabetes mellitus with diabetic neuropathy, unspecified whether long term insulin use (HCC)   [facility] [place], Priscille Heidelberg, MD   2 years ago Type 2 diabetes mellitus with diabetic neuropathy, unspecified whether long term insulin use (HCC)   [facility] [place], Priscille Heidelberg, MD   2 years ago Gynecologic exam normal   [facility] Cathlean Marseilles A, NP   2 years ago Type 2 diabetes mellitus with other circulatory complication, without long-term current use of insulin (HCC)   [facility] [place], Priscille Heidelberg, MD   2 years ago Injury of left toe, initial encounter   [facility] [place], Velna Hatchet, MD              Refused Prescriptions Disp Refills   isosorbide mononitrate (IMDUR) 60 MG 24 hr tablet [Pharmacy Med Name: ISOSORBIDE MONONIT ER 60 MG TB] 90 tablet 1    Sig: TAKE 1 TABLET BY MOUTH EVERY DAY     Cardiovascular:  Nitrates Failed - 12/04/2022  7:03 PM      Failed - Valid encounter within last 12 months    Recent Outpatient Visits           1 year ago Type 2 diabetes mellitus with diabetic neuropathy, unspecified whether long term insulin use (HCC)   [facility] [place], Priscille Heidelberg, MD   2 years ago Type 2 diabetes mellitus with diabetic neuropathy, unspecified whether long term insulin use (HCC)   [facility] [place], Priscille Heidelberg, MD   2 years ago Gynecologic exam normal   [facility] Cathlean Marseilles A, NP   2 years ago Type 2 diabetes mellitus with other circulatory complication, without long-term current use of insulin (HCC)   [facility] [place], Priscille Heidelberg, MD   2 years ago Injury of left toe, initial encounter   [facility] [place], Velna Hatchet, MD              Passed - Last BP in normal range    BP Readings from Last 1 Encounters:  11/10/22 120/70         Passed - Last Heart Rate in normal range    Pulse Readings from Last 1 Encounters:  11/10/22 89

## 2022-12-06 NOTE — Telephone Encounter (Signed)
Pt's husband called in to request a refill of this med Rx #: [id number]  lipase/protease/amylase (CREON) 36000 UNITS CPEP capsule [[id number]]    LOV: 11/08/22  PHARMACY: [facility] [address], [place] [address] Phone: [phone]  Fax: [phone]    CB#: [phone]

## 2022-12-06 NOTE — Telephone Encounter (Signed)
Request is too soon for Imdur.  Requested Prescriptions  Pending Prescriptions Disp Refills   isosorbide mononitrate (IMDUR) 60 MG 24 hr tablet [Pharmacy Med Name: ISOSORBIDE MONONIT ER 60 MG TB] 90 tablet 1    Sig: TAKE 1 TABLET BY MOUTH EVERY DAY     Cardiovascular:  Nitrates Failed - 12/04/2022  7:03 PM      Failed - Valid encounter within last 12 months    Recent Outpatient Visits           1 year ago Type 2 diabetes mellitus with diabetic neuropathy, unspecified whether long term insulin use (HCC)   [facility] [place], Priscille Heidelberg, MD   2 years ago Type 2 diabetes mellitus with diabetic neuropathy, unspecified whether long term insulin use (HCC)   [facility] [place], Priscille Heidelberg, MD   2 years ago Gynecologic exam normal   [facility] Cathlean Marseilles A, NP   2 years ago Type 2 diabetes mellitus with other circulatory complication, without long-term current use of insulin (HCC)   [facility] [place], Priscille Heidelberg, MD   2 years ago Injury of left toe, initial encounter   [facility] [place], Velna Hatchet, MD              Passed - Last BP in normal range    BP Readings from Last 1 Encounters:  11/10/22 120/70         Passed - Last Heart Rate in normal range    Pulse Readings from Last 1 Encounters:  11/10/22 89          promethazine (PHENERGAN) 25 MG tablet [Pharmacy Med Name: PROMETHAZINE 25 MG TABLET] 30 tablet 0    Sig: TAKE 1 TABLET(25 MG) BY MOUTH EVERY 8 HOURS AS NEEDED FOR NAUSEA OR VOMITING     Not Delegated - Gastroenterology: Antiemetics Failed - 12/04/2022  7:03 PM      Failed - This refill cannot be delegated      Failed - Valid encounter within last 6 months    Recent Outpatient Visits           1 year ago Type 2 diabetes mellitus with diabetic neuropathy, unspecified whether long term insulin use (HCC)   [facility] [place], Priscille Heidelberg, MD   2 years ago Type 2  diabetes mellitus with diabetic neuropathy, unspecified whether long term insulin use (HCC)   [facility] [place], Priscille Heidelberg, MD   2 years ago Gynecologic exam normal   [facility] Cathlean Marseilles A, NP   2 years ago Type 2 diabetes mellitus with other circulatory complication, without long-term current use of insulin (HCC)   [facility] [place], Priscille Heidelberg, MD   2 years ago Injury of left toe, initial encounter   [facility] [place], Velna Hatchet, MD

## 2022-12-07 ENCOUNTER — Other Ambulatory Visit: Payer: Self-pay | Admitting: Cardiovascular Disease

## 2022-12-07 ENCOUNTER — Other Ambulatory Visit (HOSPITAL_COMMUNITY): Payer: Self-pay

## 2022-12-08 ENCOUNTER — Telehealth: Payer: Self-pay

## 2022-12-08 ENCOUNTER — Other Ambulatory Visit: Payer: Self-pay | Admitting: Family Medicine

## 2022-12-08 DIAGNOSIS — I739 Peripheral vascular disease, unspecified: Secondary | ICD-10-CM

## 2022-12-08 DIAGNOSIS — M79604 Pain in right leg: Secondary | ICD-10-CM

## 2022-12-08 MED ORDER — PROMETHAZINE HCL 25 MG PO TABS: ORAL_TABLET | ORAL | 0 refills | Status: DC

## 2022-12-08 MED ORDER — METHOCARBAMOL 500 MG PO TABS
ORAL_TABLET | ORAL | 1 refills | Status: DC
Start: 2022-12-08 — End: 2023-01-31

## 2022-12-08 NOTE — Telephone Encounter (Signed)
Pt called in to check on status of these refills:   methocarbamol (ROBAXIN) 500 MG tablet [[id number]] promethazine (PHENERGAN) 25 MG tablet [[id number]]  LOV: 11/10/22  PHARMACY: [id number] [place], [place] - [address] [place] [address] Phone: [phone]  Fax: [phone]    CB#: [phone]

## 2022-12-15 ENCOUNTER — Telehealth: Payer: Self-pay

## 2022-12-15 ENCOUNTER — Other Ambulatory Visit: Payer: Self-pay | Admitting: Family Medicine

## 2022-12-15 MED ORDER — HYDROCODONE-ACETAMINOPHEN 5-325 MG PO TABS: 1.0000 | ORAL_TABLET | Freq: Four times a day (QID) | ORAL | 0 refills | Status: DC | PRN

## 2022-12-15 NOTE — Telephone Encounter (Signed)
Pt called in to request a refill of this med HYDROcodone-acetaminophen (NORCO/VICODIN) 5-325 MG tablet [[id number]].   LOV: 11/10/22  PHARMACY: [id number] [place], [place] - [address] [place] [address] Phone: [phone]  Fax: [phone] DEA #: [id number]    CB#: [phone]

## 2022-12-22 ENCOUNTER — Other Ambulatory Visit: Payer: Self-pay | Admitting: Family Medicine

## 2022-12-22 ENCOUNTER — Other Ambulatory Visit: Payer: Self-pay | Admitting: Cardiovascular Disease

## 2022-12-22 NOTE — Telephone Encounter (Signed)
Last OV 10/21/22 Requested Prescriptions  Pending Prescriptions Disp Refills   isosorbide mononitrate (IMDUR) 60 MG 24 hr tablet [Pharmacy Med Name: ISOSORBIDE MONONIT ER 60 MG TB] 90 tablet 1    Sig: TAKE 1 TABLET BY MOUTH EVERY DAY     Cardiovascular:  Nitrates Failed - 12/22/2022  9:10 AM      Failed - Valid encounter within last 12 months    Recent Outpatient Visits           1 year ago Type 2 diabetes mellitus with diabetic neuropathy, unspecified whether long term insulin use (HCC)   [facility] [place], Priscille Heidelberg, MD   2 years ago Type 2 diabetes mellitus with diabetic neuropathy, unspecified whether long term insulin use (HCC)   [facility] [place], Priscille Heidelberg, MD   2 years ago Gynecologic exam normal   [facility] Cathlean Marseilles A, NP   2 years ago Type 2 diabetes mellitus with other circulatory complication, without long-term current use of insulin (HCC)   [facility] [place], Priscille Heidelberg, MD   3 years ago Injury of left toe, initial encounter   [facility] [place], Velna Hatchet, MD              Passed - Last BP in normal range    BP Readings from Last 1 Encounters:  11/10/22 120/70         Passed - Last Heart Rate in normal range    Pulse Readings from Last 1 Encounters:  11/10/22 89

## 2022-12-27 ENCOUNTER — Inpatient Hospital Stay: Admission: RE | Admit: 2022-12-27 | Payer: HMO | Source: Ambulatory Visit

## 2023-01-04 DIAGNOSIS — E119 Type 2 diabetes mellitus without complications: Secondary | ICD-10-CM | POA: Diagnosis not present

## 2023-01-09 ENCOUNTER — Other Ambulatory Visit: Payer: Self-pay | Admitting: Family Medicine

## 2023-01-09 ENCOUNTER — Telehealth: Payer: Self-pay

## 2023-01-09 MED ORDER — HYDROCODONE-ACETAMINOPHEN 5-325 MG PO TABS: 1.0000 | ORAL_TABLET | Freq: Four times a day (QID) | ORAL | 0 refills | Status: DC | PRN

## 2023-01-09 NOTE — Telephone Encounter (Signed)
Copied from CRM [id number]. Topic: Clinical - Medication Refill >> Jan 09, 2023  9:04 AM Theodis Sato wrote: Most Recent Primary Care Visit:  Provider: Lynnea Ferrier T  [facility]  Visit Type: OFFICE VISIT  Date: 11/10/2022  Medication: HYDROcodone-acetaminophen (NORCO/VICODIN) 5-325 MG tablet  Has the patient contacted their pharmacy? Out of re-fills (Agent: If no, request that the patient contact the pharmacy for the refill. If patient does not wish to contact the pharmacy document the reason why and proceed with request.) (Agent: If yes, when and what did the pharmacy advise?)  Is this the correct pharmacy for this prescription? Yes If no, delete pharmacy and type the correct one.  This is the patient's preferred pharmacy:  [id number] [place], [place] - [address] [place] [address] Phone: [phone] Fax: [phone]   Has the prescription been filled recently? Yes  Is the patient out of the medication? No  Has the patient been seen for an appointment in the last year OR does the patient have an upcoming appointment? Yes  Can we respond through MyChart? No  Agent: Please be advised that Rx refills may take up to 3 business days. We ask that you follow-up with your pharmacy.

## 2023-01-17 ENCOUNTER — Other Ambulatory Visit: Payer: Self-pay | Admitting: Cardiovascular Disease

## 2023-01-26 ENCOUNTER — Other Ambulatory Visit: Payer: Self-pay | Admitting: Family Medicine

## 2023-01-26 DIAGNOSIS — M79604 Pain in right leg: Secondary | ICD-10-CM

## 2023-01-26 DIAGNOSIS — I739 Peripheral vascular disease, unspecified: Secondary | ICD-10-CM

## 2023-01-26 NOTE — Telephone Encounter (Signed)
Requested medications are due for refill today.  A little too soon  Requested medications are on the active medications list.  yes  Last refill. 12/08/2022 #120 1 rf  Future visit scheduled.   no  Notes to clinic.  Refill not delegated.    Requested Prescriptions  Pending Prescriptions Disp Refills   methocarbamol (ROBAXIN) 500 MG tablet [Pharmacy Med Name: METHOCARBAMOL 500 MG TABLET] 120 tablet 1    Sig: TAKE 1 TABLET BY MOUTH EVERY 6 HOURS AS NEEDED FOR MUSCLE SPASM     Not Delegated - Analgesics:  Muscle Relaxants Failed - 01/26/2023  4:49 PM      Failed - This refill cannot be delegated      Failed - Valid encounter within last 6 months    Recent Outpatient Visits           2 years ago Type 2 diabetes mellitus with diabetic neuropathy, unspecified whether long term insulin use (HCC)   [facility] [place], Priscille Heidelberg, MD   2 years ago Type 2 diabetes mellitus with diabetic neuropathy, unspecified whether long term insulin use (HCC)   [facility] [place], Priscille Heidelberg, MD   2 years ago Gynecologic exam normal   [facility] Cathlean Marseilles A, NP   2 years ago Type 2 diabetes mellitus with other circulatory complication, without long-term current use of insulin (HCC)   [facility] [place], Priscille Heidelberg, MD   3 years ago Injury of left toe, initial encounter   [facility] [place], Velna Hatchet, MD

## 2023-01-31 ENCOUNTER — Telehealth: Payer: Self-pay

## 2023-01-31 ENCOUNTER — Other Ambulatory Visit: Payer: Self-pay | Admitting: Family Medicine

## 2023-01-31 DIAGNOSIS — I739 Peripheral vascular disease, unspecified: Secondary | ICD-10-CM

## 2023-01-31 DIAGNOSIS — M79604 Pain in right leg: Secondary | ICD-10-CM

## 2023-01-31 MED ORDER — METHOCARBAMOL 500 MG PO TABS
ORAL_TABLET | ORAL | 1 refills | Status: DC
Start: 2023-01-31 — End: 2023-03-29

## 2023-01-31 NOTE — Telephone Encounter (Signed)
Last RF was 12/08/2022. Last OV was 11/10/2022. Thanks.   Copied from CRM [id number]. Topic: Clinical - Prescription Issue >> Jan 31, 2023 10:54 AM [name] wrote: Reason for CRM: pt states her pharmacy will not refill methocarbamol (ROBAXIN) 500 MG tablet and told her to contact us. Please give pt a call

## 2023-02-10 ENCOUNTER — Other Ambulatory Visit: Payer: Self-pay | Admitting: Family Medicine

## 2023-02-10 MED ORDER — HYDROCODONE-ACETAMINOPHEN 5-325 MG PO TABS: 1.0000 | ORAL_TABLET | Freq: Four times a day (QID) | ORAL | 0 refills | Status: DC | PRN

## 2023-02-10 NOTE — Telephone Encounter (Signed)
Copied from CRM [id number]. Topic: Clinical - Medication Refill >> Feb 10, 2023  1:55 PM [name] wrote: Most Recent Primary Care Visit:  Provider: Lynnea Ferrier T  Department: [facility]  Visit Type: OFFICE VISIT  Date: 11/10/2022  Medication: ***  Has the patient contacted their pharmacy?  (Agent: If no, request that the patient contact the pharmacy for the refill. If patient does not wish to contact the pharmacy document the reason why and proceed with request.) (Agent: If yes, when and what did the pharmacy advise?)  Is this the correct pharmacy for this prescription?  If no, delete pharmacy and type the correct one.  This is the patient's preferred pharmacy:  [id number] [place], [place] - [address] [place] [address] Phone: [phone] Fax: [phone]   Has the prescription been filled recently?   Is the patient out of the medication?   Has the patient been seen for an appointment in the last year OR does the patient have an upcoming appointment?   Can we respond through MyChart?   Agent: Please be advised that Rx refills may take up to 3 business days. We ask that you follow-up with your pharmacy.

## 2023-02-14 ENCOUNTER — Other Ambulatory Visit: Payer: Self-pay

## 2023-02-14 ENCOUNTER — Other Ambulatory Visit: Payer: Self-pay | Admitting: Family Medicine

## 2023-02-14 ENCOUNTER — Telehealth: Payer: Self-pay | Admitting: Family Medicine

## 2023-02-14 DIAGNOSIS — J4489 Other specified chronic obstructive pulmonary disease: Secondary | ICD-10-CM

## 2023-02-14 MED ORDER — ALBUTEROL SULFATE HFA 108 (90 BASE) MCG/ACT IN AERS: 2.0000 | INHALATION_SPRAY | Freq: Four times a day (QID) | RESPIRATORY_TRACT | 3 refills | Status: AC | PRN

## 2023-02-14 NOTE — Telephone Encounter (Signed)
 Prescription Request  02/14/2023  LOV: 11/10/2022  What is the name of the medication or equipment? albuterol  (VENTOLIN  HFA) 108 (90 Base) MCG/ACT inhaler   Have you contacted your pharmacy to request a refill? Yes   Which pharmacy would you like this sent to?  [id number] [place], [place] - [address] [place] [address] Phone: [phone] Fax: [phone]    Patient notified that their request is being sent to the clinical staff for review and that they should receive a response within 2 business days.   Please advise at [facility] [phone]

## 2023-02-17 ENCOUNTER — Other Ambulatory Visit: Payer: Self-pay | Admitting: Family Medicine

## 2023-02-17 ENCOUNTER — Other Ambulatory Visit: Payer: Self-pay | Admitting: Cardiovascular Disease

## 2023-02-17 ENCOUNTER — Telehealth: Payer: Self-pay | Admitting: Family Medicine

## 2023-02-17 ENCOUNTER — Telehealth: Payer: Self-pay

## 2023-02-17 DIAGNOSIS — E1159 Type 2 diabetes mellitus with other circulatory complications: Secondary | ICD-10-CM

## 2023-02-17 DIAGNOSIS — K861 Other chronic pancreatitis: Secondary | ICD-10-CM

## 2023-02-17 MED ORDER — PANCRELIPASE (LIP-PROT-AMYL) 36000-114000 UNITS PO CPEP: 108000.0000 [IU] | ORAL_CAPSULE | Freq: Three times a day (TID) | ORAL | 3 refills | Status: DC

## 2023-02-17 NOTE — Telephone Encounter (Signed)
 Copied from CRM [id number]. Topic: Clinical - Medication Refill >> Feb 17, 2023  9:57 AM Elle L wrote: Most Recent Primary Care Visit:  Provider: DUANNE LOWERS T  Department: [facility]  Visit Type: OFFICE VISIT  Date: 11/10/2022  Medication: lipase/protease/amylase (CREON ) 36000 UNITS CPEP capsule  Has the patient contacted their pharmacy? Yes, the pharmacy contacted   Is this the correct pharmacy for this prescription? Yes  This is the patient's preferred pharmacy:  [id number] [place], [place] - [address] [place] [address] Phone: [phone] Fax: [phone]  Has the prescription been filled recently? Yes  Is the patient out of the medication? Yes  Has the patient been seen for an appointment in the last year OR does the patient have an upcoming appointment? Yes  Can we respond through MyChart? No  Agent: Please be advised that Rx refills may take up to 3 business days. We ask that you follow-up with your pharmacy.

## 2023-02-17 NOTE — Telephone Encounter (Signed)
 Prior auth needed for [facility] ULTRA TEST test strip  KEY: [id number]

## 2023-02-17 NOTE — Telephone Encounter (Signed)
 Copied from CRM [id number]. Topic: Clinical - Medication Refill >> Feb 17, 2023  9:57 AM Elle L wrote: Most Recent Primary Care Visit:  Provider: DUANNE LOWERS T  Department: [facility]  Visit Type: OFFICE VISIT  Date: 11/10/2022  Medication: lipase/protease/amylase (CREON ) 36000 UNITS CPEP capsule  Has the patient contacted their pharmacy? Yes, the pharmacy advised the patient to reach out to her provider.  Is this the correct pharmacy for this prescription? Yes  This is the patient's preferred pharmacy:  [id number] [place], [place] - [address] [place] [address] Phone: [phone] Fax: [phone]  Has the prescription been filled recently? Yes  Is the patient out of the medication? Yes  Has the patient been seen for an appointment in the last year OR does the patient have an upcoming appointment? Yes  Can we respond through MyChart? No  Agent: Please be advised that Rx refills may take up to 3 business days. We ask that you follow-up with your pharmacy.

## 2023-02-17 NOTE — Telephone Encounter (Signed)
 Prescription Request  02/17/2023  LOV: 11/10/2022  What is the name of the medication or equipment? metFORMIN  (GLUCOPHAGE ) 1000 MG tablet   Have you contacted your pharmacy to request a refill? Yes   Which pharmacy would you like this sent to?  [id number] [place], [place] - [address] [facility] [address] [place] [address] Phone: [phone] Fax: [phone]    Patient notified that their request is being sent to the clinical staff for review and that they should receive a response within 2 business days.   Please advise at [facility] [phone]

## 2023-02-20 NOTE — Telephone Encounter (Signed)
 Please review for prior authorization: ONETOUCH ULTRA TEST test strip  KEY: [id number]

## 2023-02-21 MED ORDER — METFORMIN HCL 1000 MG PO TABS: 1000.0000 mg | ORAL_TABLET | Freq: Two times a day (BID) | ORAL | 0 refills | Status: DC

## 2023-02-21 NOTE — Telephone Encounter (Signed)
 Requested Prescriptions  Pending Prescriptions Disp Refills   metFORMIN  (GLUCOPHAGE ) 1000 MG tablet 180 tablet 0    Sig: Take 1 tablet (1,000 mg total) by mouth 2 (two) times daily with a meal.     Endocrinology:  Diabetes - Biguanides Failed - 02/21/2023 10:23 AM      Failed - B12 Level in normal range and within 720 days    Vitamin B-12  Date Value Ref Range Status  05/08/2020 313 200 - 1,100 pg/mL Final    Comment:    . Please Note: Although the reference range for vitamin B12 is [phone] pg/mL, it has been reported that between 5 and 10% of patients with values between 200 and 400 pg/mL may experience neuropsychiatric and hematologic abnormalities due to occult B12 deficiency; less than 1% of patients with values above 400 pg/mL will have symptoms. .          Failed - Valid encounter within last 6 months    Recent Outpatient Visits           2 years ago Type 2 diabetes mellitus with diabetic neuropathy, unspecified whether long term insulin  use (HCC)   [facility] [place], Butler DASEN, MD   2 years ago Type 2 diabetes mellitus with diabetic neuropathy, unspecified whether long term insulin  use (HCC)   [facility] Duanne Butler DASEN, MD   2 years ago Gynecologic exam normal   [facility] Chandra Harlene LABOR, NP   2 years ago Type 2 diabetes mellitus with other circulatory complication, without long-term current use of insulin  (HCC)   [facility] Duanne Butler DASEN, MD   3 years ago Injury of left toe, initial encounter   [facility] [place], Theodoro FALCON, MD              Passed - Cr in normal range and within 360 days    Creat  Date Value Ref Range Status  10/21/2022 1.04 0.50 - 1.05 mg/dL Final   Creatinine, Urine  Date Value Ref Range Status  06/03/2022 33 20 - 275 mg/dL Final         Passed - HBA1C is between 0 and 7.9 and within 180 days    Hgb A1c MFr Bld  Date Value Ref Range  Status  10/21/2022 7.5 (H) <5.7 % of total Hgb Final    Comment:    For someone without known diabetes, a hemoglobin A1c value of 6.5% or greater indicates that they may have  diabetes and this should be confirmed with a follow-up  test. . For someone with known diabetes, a value <7% indicates  that their diabetes is well controlled and a value  greater than or equal to 7% indicates suboptimal  control. A1c targets should be individualized based on  duration of diabetes, age, comorbid conditions, and  other considerations. . Currently, no consensus exists regarding use of hemoglobin A1c for diagnosis of diabetes for children. .          Passed - eGFR in normal range and within 360 days    GFR, Est African American  Date Value Ref Range Status  04/03/2020 69 > OR = 60 mL/min/1.54m2 Final   GFR, Est Non African American  Date Value Ref Range Status  04/03/2020 59 (L) > OR = 60 mL/min/1.65m2 Final   GFR, Estimated  Date Value Ref Range Status  06/30/2021 58 (L) >60 mL/min Final    Comment:    (  NOTE) Calculated using the CKD-EPI Creatinine Equation (2021)    GFR  Date Value Ref Range Status  12/07/2017 59.28 (L) >60.00 mL/min Final   eGFR  Date Value Ref Range Status  10/21/2022 60 > OR = 60 mL/min/1.62m2 Final  08/30/2022 62 >59 mL/min/1.73 Final         Passed - CBC within normal limits and completed in the last 12 months    WBC  Date Value Ref Range Status  10/21/2022 5.7 3.8 - 10.8 Thousand/uL Final   RBC  Date Value Ref Range Status  10/21/2022 4.32 3.80 - 5.10 Million/uL Final   Hemoglobin  Date Value Ref Range Status  10/21/2022 11.9 11.7 - 15.5 g/dL Final  92/83/7975 88.0 11.1 - 15.9 g/dL Final   HCT  Date Value Ref Range Status  10/21/2022 37.5 35.0 - 45.0 % Final   Hematocrit  Date Value Ref Range Status  08/30/2022 39.2 34.0 - 46.6 % Final   MCHC  Date Value Ref Range Status  10/21/2022 31.7 (L) 32.0 - 36.0 g/dL Final   [facility]  Date Value  Ref Range Status  10/21/2022 27.5 27.0 - 33.0 pg Final   MCV  Date Value Ref Range Status  10/21/2022 86.8 80.0 - 100.0 fL Final  08/30/2022 86 79 - 97 fL Final   No results found for: PLTCOUNTKUC, LABPLAT, POCPLA RDW  Date Value Ref Range Status  10/21/2022 13.6 11.0 - 15.0 % Final  08/30/2022 14.3 11.7 - 15.4 % Final

## 2023-03-08 ENCOUNTER — Other Ambulatory Visit: Payer: Self-pay | Admitting: Cardiovascular Disease

## 2023-03-08 DIAGNOSIS — E119 Type 2 diabetes mellitus without complications: Secondary | ICD-10-CM | POA: Diagnosis not present

## 2023-03-08 DIAGNOSIS — H25812 Combined forms of age-related cataract, left eye: Secondary | ICD-10-CM | POA: Diagnosis not present

## 2023-03-14 ENCOUNTER — Other Ambulatory Visit: Payer: Self-pay | Admitting: Family Medicine

## 2023-03-14 MED ORDER — HYDROCODONE-ACETAMINOPHEN 5-325 MG PO TABS: 1.0000 | ORAL_TABLET | Freq: Four times a day (QID) | ORAL | 0 refills | Status: DC | PRN

## 2023-03-14 NOTE — Telephone Encounter (Signed)
Copied from CRM [id number]. Topic: Clinical - Medication Refill >> Mar 14, 2023  9:31 AM Bobbye Morton wrote: Most Recent Primary Care Visit:  Provider: Lynnea Ferrier T  Department: [facility]  Visit Type: OFFICE VISIT  Date: 11/10/2022  Medication: HYDROcodone-acetaminophen (NORCO/VICODIN) 5-325 MG tablet   Has the patient contacted their pharmacy? No (Agent: If no, request that the patient contact the pharmacy for the refill. If patient does not wish to contact the pharmacy document the reason why and proceed with request.) (Agent: If yes, when and what did the pharmacy advise?)  Is this the correct pharmacy for this prescription? Yes If no, delete pharmacy and type the correct one.  This is the patient's preferred pharmacy:  [id number] [place], [place] - [address] [place] [address] Phone: [phone] Fax: [phone]   Has the prescription been filled recently? No  Is the patient out of the medication? Yes, has 2 days left   Has the patient been seen for an appointment in the last year OR does the patient have an upcoming appointment? Yes  Can we respond through MyChart? No  Agent: Please be advised that Rx refills may take up to 3 business days. We ask that you follow-up with your pharmacy.

## 2023-03-21 DIAGNOSIS — H2512 Age-related nuclear cataract, left eye: Secondary | ICD-10-CM | POA: Diagnosis not present

## 2023-03-28 ENCOUNTER — Other Ambulatory Visit: Payer: Self-pay | Admitting: Family Medicine

## 2023-03-28 DIAGNOSIS — M79604 Pain in right leg: Secondary | ICD-10-CM

## 2023-03-28 DIAGNOSIS — I739 Peripheral vascular disease, unspecified: Secondary | ICD-10-CM

## 2023-03-29 ENCOUNTER — Ambulatory Visit: Payer: Self-pay | Admitting: Family Medicine

## 2023-03-29 NOTE — Telephone Encounter (Signed)
Chief Complaint: productive cough Symptoms: productive cough, right sided chest pain, SOB when coughing Frequency: x 1 week Pertinent Negatives: Patient denies recent fevers. Disposition: [x] ED /[] Urgent Care (no appt availability in office) / [] Appointment(In office/virtual)/ []  [facility]/ [] Home Care/ [x] Refused Recommended Disposition /[] [facility]/ []  Follow-up with PCP Additional Notes: Patient states she has been taking Dayquil, Nyquil, Robitussin cough syrup, Tylenol sinus/cold medicine. Advised patient to place home pulse oximeter on and she states she is reading 85-87%. Advised ED and patient refused stating she has had too may bad experiences lately. Called [facility] and staff aware of ED refusal   Copied from CRM [id number]. Topic: Clinical - Red Word Triage >> Mar 29, 2023  9:24 AM [name] wrote: Red Word that prompted transfer to Nurse Triage: Hurts to breathe pain in chest and back. Cough. Husband treated for pneumonia last week. Unable to take a deep breath. Patient has pancreatitis aggravates blood sugar and other things to take cough syrup. Reason for Disposition  [1] MODERATE difficulty breathing (e.g., speaks in phrases, SOB even at rest, pulse 100-120) AND [2] still present when not coughing  Answer Assessment - Initial Assessment Questions 1. ONSET: "When did the cough begin?"      X 1 week.  2. SEVERITY: "How bad is the cough today?"      "As long as I be still it's not as bad, but I cough a lot"  3. SPUTUM: "Describe the color of your sputum" (none, dry cough; clear, white, yellow, green)     Sometimes its gray looking.  4. HEMOPTYSIS: "Are you coughing up any blood?" If so ask: "How much?" (flecks, streaks, tablespoons, etc.)     Denies.  5. DIFFICULTY BREATHING: "Are you having difficulty breathing?" If Yes, ask: "How bad is it?" (e.g., mild, moderate, severe)    - MILD: No SOB at rest, mild SOB with walking, speaks normally in sentences, can  lie down, no retractions, pulse < 100.    - MODERATE: SOB at rest, SOB with minimal exertion and prefers to sit, cannot lie down flat, speaks in phrases, mild retractions, audible wheezing, pulse 100-120.    - SEVERE: Very SOB at rest, speaks in single words, struggling to breathe, sitting hunched forward, retractions, pulse > 120      She states "it does get hard to breath at times" and states its hard to explain. Patient speaking in full sentences.  6. FEVER: "Do you have a fever?" If Yes, ask: "What is your temperature, how was it measured, and when did it start?"     Patient states she has not checked in a couple of days but states she had a low grade fever last week.  7. CARDIAC HISTORY: "Do you have any history of heart disease?" (e.g., heart attack, congestive heart failure)      She states she has some blockages that are being treated with medication and states it has been under control for years.  8. LUNG HISTORY: "Do you have any history of lung disease?"  (e.g., pulmonary embolus, asthma, emphysema)     Asthma, she states it feels like an attack when the coughing gets bad.  9. PE RISK FACTORS: "Do you have a history of blood clots?" (or: recent major surgery, recent prolonged travel, bedridden)     Clots throughout legs.  10. OTHER SYMPTOMS: "Do you have any other symptoms?" (e.g., runny nose, wheezing, chest pain)       Right sided chest pain  11. TRAVEL: "Have you traveled out of the country in the last month?" (e.g., travel history, exposures)       Denies travel, states  her husband had pneumonia last week.  Protocols used: Cough - Acute Productive-A-AH

## 2023-03-29 NOTE — Telephone Encounter (Signed)
Spoke w/pt advise pt of Dr. Mervyn Skeeters. Msg " Given her symptoms I recommend ER evaluation. I know she does not want to go but this is what I recommend. Thanks."  However pt dont want to go to ER/wants appt w/pcp. Appt sent for 330pm tom. W/pcp

## 2023-03-30 ENCOUNTER — Other Ambulatory Visit: Payer: Self-pay | Admitting: Family Medicine

## 2023-03-30 ENCOUNTER — Ambulatory Visit (INDEPENDENT_AMBULATORY_CARE_PROVIDER_SITE_OTHER): Payer: HMO | Admitting: Family Medicine

## 2023-03-30 ENCOUNTER — Encounter: Payer: Self-pay | Admitting: Family Medicine

## 2023-03-30 ENCOUNTER — Telehealth: Payer: Self-pay

## 2023-03-30 VITALS — BP 128/62 | HR 91 | Temp 98.1°F | Ht 62.0 in | Wt 202.0 lb

## 2023-03-30 DIAGNOSIS — R051 Acute cough: Secondary | ICD-10-CM | POA: Diagnosis not present

## 2023-03-30 MED ORDER — HYDROCOD POLI-CHLORPHE POLI ER 10-8 MG/5ML PO SUER
5.0000 mL | Freq: Two times a day (BID) | ORAL | 0 refills | Status: DC | PRN
Start: 2023-03-30 — End: 2023-03-30

## 2023-03-30 MED ORDER — PROMETHAZINE-DM 6.25-15 MG/5ML PO SYRP: 5.0000 mL | ORAL_SOLUTION | Freq: Four times a day (QID) | ORAL | 0 refills | Status: DC | PRN

## 2023-03-30 MED ORDER — LEVOFLOXACIN 500 MG PO TABS: 500.0000 mg | ORAL_TABLET | Freq: Every day | ORAL | 0 refills | Status: AC

## 2023-03-30 NOTE — Progress Notes (Signed)
Subjective:    Patient ID: [name], female    DOB: January 07, 1956, 68 y.o.   MRN: [id number]  Patient has been coughing for more than a week.  She reports severe right-sided pleurisy in her right posterior ribs.  She reports a cough with brown sputum.  She reports subjective fevers.  She states that yesterday her pulse oximetry was 88% on room air.  Today is 96%.  She is nontoxic-appearing and afebrile however she does have right basilar crackles Past Medical History:  Diagnosis Date   Aortic atherosclerosis (HCC)    Asthma    CAD (coronary artery disease)    a. LHC 2011: Diffuse distal and branch vessel disease - patient managed medically, no interventional options. b. Nuc 03/2014 - low risk, no ischemia.   Complication of anesthesia    slow to wake up with last surgery in 2011    Diabetes mellitus with circulatory complication (HCC)    Diverticulosis    Dyslipidemia    GERD (gastroesophageal reflux disease)    Headache    hx of migraines    Hypertension    Osteoarthritis    severe right knee  R TKR   PAD (peripheral artery disease) (HCC)    a. Severe stenosis mid right SFA s/p atherectomy 03/25/09. b. peripheral angiography in 12/2011 which showed only about 50% diffuse right SFA stenosis.   Pancreatitis 2003   Renal artery stenosis (HCC)    a. LHC 2011 - 40-50% left RAS.   Tobacco use disorder    quit 11/10   Past Surgical History:  Procedure Laterality Date   ABDOMINAL AORTAGRAM N/A 12/21/2011   Procedure: ABDOMINAL AORTAGRAM;  Surgeon: Iran Ouch, MD;  Location: [facility];  Service: Cardiovascular;  Laterality: N/A;   ABDOMINAL AORTOGRAM W/LOWER EXTREMITY Bilateral 05/08/2019   Procedure: ABDOMINAL AORTOGRAM W/LOWER EXTREMITY;  Surgeon: Iran Ouch, MD;  [facility]: [facility];  Service: Cardiovascular;  Laterality: Bilateral;   ABDOMINAL AORTOGRAM W/LOWER EXTREMITY N/A 09/28/2022   Procedure: ABDOMINAL AORTOGRAM W/LOWER EXTREMITY;  Surgeon: Iran Ouch, MD;  [facility];  Service: Cardiovascular;  Laterality: N/A;   CARDIAC CATHETERIZATION     ENDARTERECTOMY FEMORAL Right 10/10/2019   Procedure: RIGHT FEMORAL ENDARTERECTOMY;  Surgeon: Cephus Shelling, MD;  Location: [facility];  Service: Vascular;  Laterality: Right;   ESOPHAGOGASTRODUODENOSCOPY (EGD) WITH PROPOFOL N/A 10/05/2017   Procedure: ESOPHAGOGASTRODUODENOSCOPY (EGD) WITH PROPOFOL;  Surgeon: Rachael Fee, MD;  Location: [facility];  Service: Endoscopy;  Laterality: N/A;   EUS N/A 10/05/2017   Procedure: UPPER ENDOSCOPIC ULTRASOUND (EUS) RADIAL;  Surgeon: Rachael Fee, MD;  Location: [facility];  Service: Endoscopy;  Laterality: N/A;   FOOT SURGERY Left    LOWER EXTREMITY ANGIOGRAM Bilateral 05/27/2015   Procedure: Lower Extremity Angiogram;  Surgeon: Iran Ouch, MD;  [facility];  Service: Cardiovascular;  Laterality: Bilateral;   PATCH ANGIOPLASTY Right 10/10/2019   Procedure: PATCH ANGIOPLASTY USING Livia Snellen BIOLOGIC PATCH;  Surgeon: Cephus Shelling, MD;  Location: [facility] OR;  Service: Vascular;  Laterality: Right;   PERIPHERAL INTRAVASCULAR LITHOTRIPSY  09/28/2022   Procedure: PERIPHERAL INTRAVASCULAR LITHOTRIPSY;  Surgeon: Iran Ouch, MD;  [facility];  Service: Cardiovascular;;   PERIPHERAL VASCULAR ATHERECTOMY Left 05/08/2019   Procedure: PERIPHERAL VASCULAR ATHERECTOMY;  Surgeon: Iran Ouch, MD;  [facility];  Service: Cardiovascular;  Laterality: Left;   PERIPHERAL VASCULAR CATHETERIZATION N/A 05/27/2015  Procedure: Abdominal Aortogram;  Surgeon: Iran Ouch, MD;  [facility];  Service: Cardiovascular;  Laterality: N/A;   PERIPHERAL VASCULAR CATHETERIZATION Right 05/27/2015   Procedure: Peripheral Vascular Intervention;  Surgeon: Iran Ouch, MD;  [facility];  Service: Cardiovascular;  Laterality: Right;  SFA   PERIPHERAL VASCULAR  INTERVENTION  09/28/2022   Procedure: PERIPHERAL VASCULAR INTERVENTION;  Surgeon: Iran Ouch, MD;  [facility];  Service: Cardiovascular;;   TOTAL KNEE ARTHROPLASTY Right 2011   TOTAL KNEE ARTHROPLASTY Left 05/22/2014   Procedure: LEFT TOTAL KNEE ARTHROPLASTY;  Surgeon: Ranee Gosselin, MD;  Location: [facility];  Service: Orthopedics;  Laterality: Left;   TRIGGER FINGER RELEASE Left 07/07/2021   Procedure: LEFT INDEX FINGER RELEASE TRIGGER FINGER/A-1 PULLEY / CARPOMETACARPAL INJECTION;  Surgeon: Marlyne Beards, MD;  Location: [facility];  Service: Orthopedics;  Laterality: Left;   TUBAL LIGATION  1980   Current Outpatient Medications on File Prior to Visit  Medication Sig Dispense Refill   acetaminophen (TYLENOL) 500 MG tablet Take 1,000 mg by mouth every 6 (six) hours as needed for moderate pain or headache.     albuterol (VENTOLIN HFA) 108 (90 Base) MCG/ACT inhaler Inhale 2 puffs into the lungs every 6 (six) hours as needed for wheezing or shortness of breath. 8.5 each 3   aspirin EC 81 MG tablet Take 1 tablet (81 mg total) by mouth daily. Swallow whole.     bisoprolol (ZEBETA) 5 MG tablet TAKE 1/2 TABLET(2.5 MG) BY MOUTH DAILY 45 tablet 3   Calcium Polycarbophil (FIBER-CAPS PO) Take 1 capsule by mouth daily.     clopidogrel (PLAVIX) 75 MG tablet TAKE 1 TABLET(75 MG) BY MOUTH DAILY 90 tablet 1   COLLAGEN PO Take 1,000 mg by mouth daily.     Cyanocobalamin (VITAMIN B-12) 5000 MCG TBDP Take 5,000 mcg by mouth daily.     diclofenac sodium (VOLTAREN) 1 % GEL Apply 2 g topically 4 (four) times daily. (Patient taking differently: Apply 2 g topically 4 (four) times daily as needed (for hand pain).) 100 g 0   doxycycline (VIBRA-TABS) 100 MG tablet Take 1 tablet (100 mg total) by mouth 2 (two) times daily. 14 tablet 0   FARXIGA 10 MG TABS tablet TAKE 1 TABLET BY MOUTH EVERY DAY BEFORE BREAKFAST 90 tablet 1   fluticasone-salmeterol (ADVAIR DISKUS) 250-50 MCG/ACT AEPB  Inhale 1 puff into the lungs in the morning and at bedtime. 60 each 11   HYDROcodone-acetaminophen (NORCO/VICODIN) 5-325 MG tablet Take 1 tablet by mouth every 6 (six) hours as needed for moderate pain (pain score 4-6). Patient needs to schedule office visit 130 tablet 0   isosorbide mononitrate (IMDUR) 60 MG 24 hr tablet TAKE 1 TABLET BY MOUTH EVERY DAY 90 tablet 1   lipase/protease/amylase (CREON) 36000 UNITS CPEP capsule Take 3 capsules (108,000 Units total) by mouth 3 (three) times daily with meals. May also take 1 capsule (36,000 Units total) as needed (with snacks). 200 capsule 3   loratadine (CLARITIN) 10 MG tablet Take 10 mg by mouth daily.     metFORMIN (GLUCOPHAGE) 1000 MG tablet Take 1 tablet (1,000 mg total) by mouth 2 (two) times daily with a meal. 180 tablet 0   methocarbamol (ROBAXIN) 500 MG tablet TAKE 1 TABLET BY MOUTH EVERY 6 HOURS AS NEEDED FOR MUSCLE SPASM 120 tablet 1   Multiple Vitamin (MULTIVITAMIN) tablet Take 1 tablet by mouth daily.     nitroGLYCERIN (NITROSTAT)  0.4 MG SL tablet Place 1 tablet under the tongue every 5 (five) minutes as needed for chest pain.     ONETOUCH ULTRA TEST test strip SMARTSIG:Via Meter 1-3 Times Daily     pantoprazole (PROTONIX) 40 MG tablet Take 1 tablet (40 mg total) by mouth daily. 90 tablet 1   pioglitazone (ACTOS) 30 MG tablet TAKE 1 TABLET BY MOUTH EVERY DAY 90 tablet 1   pregabalin (LYRICA) 50 MG capsule Take 1 capsule (50 mg total) by mouth 3 (three) times daily. 90 capsule 1   Probiotic Product (TRUBIOTICS PO) Take 1 capsule by mouth daily.     promethazine (PHENERGAN) 25 MG tablet TAKE 1 TABLET(25 MG) BY MOUTH EVERY 8 HOURS AS NEEDED FOR NAUSEA OR VOMITING 30 tablet 0   rosuvastatin (CRESTOR) 40 MG tablet TAKE 1 TABLET(40 MG) BY MOUTH EVERY NIGHT 90 tablet 1   sitaGLIPtin (JANUVIA) 100 MG tablet TAKE 1 TABLET BY MOUTH EVERY DAY 90 tablet 1   Current Facility-Administered Medications on File Prior to Visit  Medication Dose Route  Frequency Provider Last Rate Last Admin   triamcinolone acetonide (KENALOG) 10 MG/ML injection 10 mg  10 mg Other Once Regal, Norman S, DPM       Allergies  Allergen Reactions   Gabapentin Other (See Comments)    Brain shakes   Glucotrol [Glipizide] Rash and Other (See Comments)    Red rash   Influenza Vaccines Other (See Comments)    Significant arm soreness requiring 1 year of physical therapy   Latex Rash   Nortriptyline Other (See Comments)    BRAIN SHAKE   Tetanus Toxoid Hives, Swelling, Rash and Other (See Comments)    Swelling to site of injection with rash and fever   Other Itching, Other (See Comments) and Cough    Patient is highly allergic to Cats and she begins to break out in rash and turns red if someone caring for her has cats at home.  Patient takes Claritin for this type of reaction.  Patient stated that if it goes untreated then he begins to have difficulty breathing   Prednisone & Diphenhydramine     Pt get really headaches   Invokana [Canagliflozin] Other (See Comments)    Yeast Infections   Social History   Socioeconomic History   Marital status: Married    Spouse name: Not on file   Number of children: Not on file   Years of education: 10   Highest education level: Not on file  Occupational History   Not on file  Tobacco Use   Smoking status: Former    Current packs/day: 0.00    Types: Cigarettes    Quit date: 12/13/2008    Years since quitting: 14.3   Smokeless tobacco: Never  Vaping Use   Vaping status: Never Used  Substance and Sexual Activity   Alcohol use: No    Comment: Quit drinking around year 2000   Drug use: No    Comment: Patient quit smoking around 2011   Sexual activity: Yes    Partners: Male    Birth control/protection: Post-menopausal  Other Topics Concern   Not on file  Social History Narrative   Denies any IV drug use or marijuana use.  Former smoker for 30 years, quit Nov. 2010. She is married with 2 children. Disabled from  knee, not working.   Social Drivers of Health   Financial Resource Strain: Medium Risk (09/28/2018)   Overall Financial Resource Strain (CARDIA)  Difficulty of Paying Living Expenses: Somewhat hard  Food Insecurity: Not on file  Transportation Needs: No Transportation Needs (09/28/2018)   PRAPARE - Administrator, Civil Service (Medical): No    Lack of Transportation (Non-Medical): No  Physical Activity: Not on file  Stress: Not on file  Social Connections: Unknown (09/28/2018)   Social Connection and Isolation Panel [NHANES]    Frequency of Communication with Friends and Family: Once a week    Frequency of Social Gatherings with Friends and Family: Once a week    Attends Religious Services: Not on Marketing executive or Organizations: Not on file    Attends Banker Meetings: Not on file    Marital Status: Married  Intimate Partner Violence: Not At Risk (09/28/2018)   Humiliation, Afraid, Rape, and Kick questionnaire    Fear of Current or Ex-Partner: No    Emotionally Abused: No    Physically Abused: No    Sexually Abused: No   Family History  Problem Relation Age of Onset   CVA Mother    Emphysema Father    Breast cancer Sister 55   Colon cancer Neg Hx      Review of Systems  All other systems reviewed and are negative.      Objective:   Physical Exam Vitals reviewed.  Constitutional:      General: She is not in acute distress.    Appearance: She is well-developed. She is not diaphoretic.  Neck:     Vascular: No carotid bruit or JVD.  Cardiovascular:     Rate and Rhythm: Normal rate and regular rhythm.     Heart sounds: Normal heart sounds. No murmur heard. Pulmonary:     Effort: Pulmonary effort is normal.     Breath sounds: Rales present. No wheezing.    Chest:     Chest wall: No tenderness or crepitus.  Musculoskeletal:     Cervical back: Neck supple.     Right lower leg: No edema.     Left lower leg: No edema.   Skin:    Findings: No erythema or rash.  Neurological:     Mental Status: She is alert and oriented to person, place, and time.     Cranial Nerves: No cranial nerve deficit.     Motor: No abnormal muscle tone.     Coordination: Coordination normal.     Deep Tendon Reflexes: Reflexes are normal and symmetric.         Assessment & Plan:  Acute cough Patient has significant pleurisy and what appears to be crackles in the right lower lobe.  Start the patient on Levaquin 500 mg a day for 7 days for possible early community-acquired pneumonia.  Reassess in 1 week or sooner if worse.  I will give her Tussionex for cough.

## 2023-03-30 NOTE — Telephone Encounter (Signed)
Copied from CRM [id number]. Topic: Clinical - Prescription Issue >> Mar 30, 2023  4:14 PM Geroge Baseman wrote: Reason for CRM: HYDROcodone (TUSSIONEX) 10-8 MG/5ML. Insurance does not cover this medication patient is wanting to see if she can get one sent over that is covered.

## 2023-04-04 DIAGNOSIS — H25812 Combined forms of age-related cataract, left eye: Secondary | ICD-10-CM | POA: Diagnosis not present

## 2023-04-07 ENCOUNTER — Other Ambulatory Visit: Payer: HMO

## 2023-04-07 DIAGNOSIS — E785 Hyperlipidemia, unspecified: Secondary | ICD-10-CM | POA: Diagnosis not present

## 2023-04-07 DIAGNOSIS — E119 Type 2 diabetes mellitus without complications: Secondary | ICD-10-CM | POA: Diagnosis not present

## 2023-04-07 DIAGNOSIS — I25118 Atherosclerotic heart disease of native coronary artery with other forms of angina pectoris: Secondary | ICD-10-CM

## 2023-04-07 DIAGNOSIS — I10 Essential (primary) hypertension: Secondary | ICD-10-CM

## 2023-04-07 DIAGNOSIS — I739 Peripheral vascular disease, unspecified: Secondary | ICD-10-CM

## 2023-04-08 LAB — COMPLETE METABOLIC PANEL WITH GFR
AG Ratio: 1.8 (calc) (ref 1.0–2.5)
ALT: 17 U/L (ref 6–29)
AST: 20 U/L (ref 10–35)
Albumin: 4.3 g/dL (ref 3.6–5.1)
Alkaline phosphatase (APISO): 72 U/L (ref 37–153)
BUN/Creatinine Ratio: 20 (calc) (ref 6–22)
BUN: 27 mg/dL — ABNORMAL HIGH (ref 7–25)
CO2: 23 mmol/L (ref 20–32)
Calcium: 10.5 mg/dL — ABNORMAL HIGH (ref 8.6–10.4)
Chloride: 103 mmol/L (ref 98–110)
Creat: 1.34 mg/dL — ABNORMAL HIGH (ref 0.50–1.05)
Globulin: 2.4 g/dL (ref 1.9–3.7)
Glucose, Bld: 102 mg/dL — ABNORMAL HIGH (ref 65–99)
Potassium: 4.7 mmol/L (ref 3.5–5.3)
Sodium: 140 mmol/L (ref 135–146)
Total Bilirubin: 0.3 mg/dL (ref 0.2–1.2)
Total Protein: 6.7 g/dL (ref 6.1–8.1)
eGFR: 44 mL/min/{1.73_m2} — ABNORMAL LOW (ref >=60)

## 2023-04-08 LAB — CBC WITH DIFFERENTIAL/PLATELET
Absolute Lymphocytes: 1445 {cells}/uL (ref 850–3900)
Absolute Monocytes: 642 {cells}/uL (ref 200–950)
Basophils Absolute: 73 {cells}/uL (ref 0–200)
Basophils Relative: 1 %
Eosinophils Absolute: 248 {cells}/uL (ref 15–500)
Eosinophils Relative: 3.4 %
HCT: 36.9 % (ref 35.0–45.0)
Hemoglobin: 11.8 g/dL (ref 11.7–15.5)
MCH: 27 pg (ref 27.0–33.0)
MCHC: 32 g/dL (ref 32.0–36.0)
MCV: 84.4 fL (ref 80.0–100.0)
MPV: 9.8 fL (ref 7.5–12.5)
Monocytes Relative: 8.8 %
Neutro Abs: 4891 {cells}/uL (ref 1500–7800)
Neutrophils Relative %: 67 %
Platelets: 306 10*3/uL (ref 140–400)
RBC: 4.37 10*6/uL (ref 3.80–5.10)
RDW: 14.1 % (ref 11.0–15.0)
Total Lymphocyte: 19.8 %
WBC: 7.3 10*3/uL (ref 3.8–10.8)

## 2023-04-08 LAB — LIPID PANEL
Cholesterol: 142 mg/dL (ref <200)
HDL: 51 mg/dL (ref >=50)
LDL Cholesterol (Calc): 61 mg/dL
Non-HDL Cholesterol (Calc): 91 mg/dL (ref <130)
Total CHOL/HDL Ratio: 2.8 (calc) (ref <5.0)
Triglycerides: 252 mg/dL — ABNORMAL HIGH (ref <150)

## 2023-04-08 LAB — HEMOGLOBIN A1C
Hgb A1c MFr Bld: 8.3 %{Hb} — ABNORMAL HIGH (ref <5.7)
Mean Plasma Glucose: 192 mg/dL
eAG (mmol/L): 10.6 mmol/L

## 2023-04-10 ENCOUNTER — Other Ambulatory Visit: Payer: HMO

## 2023-04-10 ENCOUNTER — Telehealth: Payer: Self-pay

## 2023-04-10 NOTE — Telephone Encounter (Signed)
 Copied from CRM [id number]. Topic: Clinical - Lab/Test Results >> Apr 10, 2023 12:56 PM Desma Mcgregor wrote: Reason for CRM: Read pt her lab results. She said she will not discuss insulin and will tell the doctor herself. Asking for Germaine Pomfret to call her back.

## 2023-04-11 ENCOUNTER — Other Ambulatory Visit: Payer: Self-pay | Admitting: Family Medicine

## 2023-04-11 NOTE — Telephone Encounter (Unsigned)
 Copied from CRM [id number]. Topic: Clinical - Medication Refill >> Apr 11, 2023  9:49 AM Shelah Lewandowsky wrote: Most Recent Primary Care Visit:  Provider: [facility]  Department: [facility]  Visit Type: LAB VISIT  Date: 04/07/2023  Medication: HYDROcodone-acetaminophen (NORCO/VICODIN) 5-325 MG tablet  Has the patient contacted their pharmacy? No (Agent: If no, request that the patient contact the pharmacy for the refill. If patient does not wish to contact the pharmacy document the reason why and proceed with request.) (Agent: If yes, when and what did the pharmacy advise?)  Is this the correct pharmacy for this prescription? Yes If no, delete pharmacy and type the correct one.  This is the patient's preferred pharmacy:  [id number] [place], [place] - [address] [place] [address] Phone: [phone] Fax: [phone]   Has the prescription been filled recently? {yes/no:20286}  Is the patient out of the medication? Yes  Has the patient been seen for an appointment in the last year OR does the patient have an upcoming appointment? Yes  Can we respond through MyChart? No  Agent: Please be advised that Rx refills may take up to 3 business days. We ask that you follow-up with your pharmacy.

## 2023-04-12 NOTE — Telephone Encounter (Signed)
 Requested medication (s) are due for refill today: Yes  Requested medication (s) are on the active medication list: Yes  Last refill:  03/14/23  Future visit scheduled: No  Notes to clinic:  Unable to refill per protocol, cannot delegate.      Requested Prescriptions  Pending Prescriptions Disp Refills   HYDROcodone-acetaminophen (NORCO/VICODIN) 5-325 MG tablet 130 tablet 0    Sig: Take 1 tablet by mouth every 6 (six) hours as needed for moderate pain (pain score 4-6). Patient needs to schedule office visit     Not Delegated - Analgesics:  Opioid Agonist Combinations Failed - 04/12/2023 10:49 AM      Failed - This refill cannot be delegated      Failed - Urine Drug Screen completed in last 360 days      Failed - Valid encounter within last 3 months    Recent Outpatient Visits           2 years ago Type 2 diabetes mellitus with diabetic neuropathy, unspecified whether long term insulin use (HCC)   [facility] Pickard, Priscille Heidelberg, MD   2 years ago Type 2 diabetes mellitus with diabetic neuropathy, unspecified whether long term insulin use (HCC)   [facility] Pickard, Priscille Heidelberg, MD   3 years ago Gynecologic exam normal   [facility] Cathlean Marseilles A, NP   3 years ago Type 2 diabetes mellitus with other circulatory complication, without long-term current use of insulin (HCC)   [facility] Pickard, Priscille Heidelberg, MD   3 years ago Injury of left toe, initial encounter   [facility] [place], Velna Hatchet, MD

## 2023-04-13 ENCOUNTER — Ambulatory Visit (INDEPENDENT_AMBULATORY_CARE_PROVIDER_SITE_OTHER): Payer: HMO | Admitting: Family Medicine

## 2023-04-13 ENCOUNTER — Encounter: Payer: Self-pay | Admitting: Family Medicine

## 2023-04-13 VITALS — BP 132/72 | HR 76 | Temp 97.7°F | Ht 62.0 in | Wt 201.6 lb

## 2023-04-13 DIAGNOSIS — E1159 Type 2 diabetes mellitus with other circulatory complications: Secondary | ICD-10-CM | POA: Diagnosis not present

## 2023-04-13 DIAGNOSIS — Z7984 Long term (current) use of oral hypoglycemic drugs: Secondary | ICD-10-CM | POA: Diagnosis not present

## 2023-04-13 MED ORDER — HYDROCODONE-ACETAMINOPHEN 5-325 MG PO TABS: 1.0000 | ORAL_TABLET | Freq: Four times a day (QID) | ORAL | 0 refills | Status: DC | PRN

## 2023-04-13 NOTE — Progress Notes (Signed)
 Subjective:    Patient ID: [name], female    DOB: 1955-12-08, 68 y.o.   MRN: [id number]  Patient is a 68 year old female with a history of coronary artery disease as well as type 2 diabetes mellitus and chronic pancreatic insufficiency.  Recent hemoglobin A1c was 8.3 currently on metformin, Actos, Januvia, and [place].  Therefore I recommended adding insulin.  I did not feel a GLP-1 agonist would be a good option for her because of her history of pancreatitis and the fact she is already on Januvia.  She has a history of allergies to glipizide.  Patient however states adamantly she refuses insulin.  She is unwilling to take insulin.  She states that she has always been able to manage her sugars on her own and she would like to try this first.  She does eat fast food including [place] fries frequently.  She denies drinking soda or juices. Past Medical History:  Diagnosis Date   Aortic atherosclerosis (HCC)    Asthma    CAD (coronary artery disease)    a. LHC 2011: Diffuse distal and branch vessel disease - patient managed medically, no interventional options. b. Nuc 03/2014 - low risk, no ischemia.   Complication of anesthesia    slow to wake up with last surgery in 2011    Diabetes mellitus with circulatory complication (HCC)    Diverticulosis    Dyslipidemia    GERD (gastroesophageal reflux disease)    Headache    hx of migraines    Hypertension    Osteoarthritis    severe right knee  R TKR   PAD (peripheral artery disease) (HCC)    a. Severe stenosis mid right SFA s/p atherectomy 03/25/09. b. peripheral angiography in 12/2011 which showed only about 50% diffuse right SFA stenosis.   Pancreatitis 2003   Renal artery stenosis (HCC)    a. LHC 2011 - 40-50% left RAS.   Tobacco use disorder    quit 11/10   Past Surgical History:  Procedure Laterality Date   ABDOMINAL AORTAGRAM N/A 12/21/2011   Procedure: ABDOMINAL AORTAGRAM;  Surgeon: Iran Ouch, MD;  Location: [facility];  Service: Cardiovascular;  Laterality: N/A;   ABDOMINAL AORTOGRAM W/LOWER EXTREMITY Bilateral 05/08/2019   Procedure: ABDOMINAL AORTOGRAM W/LOWER EXTREMITY;  Surgeon: Iran Ouch, MD;  Location: [facility];  Service: Cardiovascular;  Laterality: Bilateral;   ABDOMINAL AORTOGRAM W/LOWER EXTREMITY N/A 09/28/2022   Procedure: ABDOMINAL AORTOGRAM W/LOWER EXTREMITY;  Surgeon: Iran Ouch, MD;  Location: [facility];  Service: Cardiovascular;  Laterality: N/A;   CARDIAC CATHETERIZATION     ENDARTERECTOMY FEMORAL Right 10/10/2019   Procedure: RIGHT FEMORAL ENDARTERECTOMY;  Surgeon: Cephus Shelling, MD;  Location[facility];  Service: Vascular;  Laterality: Right;   ESOPHAGOGASTRODUODENOSCOPY (EGD) WITH PROPOFOL N/A 10/05/2017   Procedure: ESOPHAGOGASTRODUODENOSCOPY (EGD) WITH PROPOFOL;  Surgeon: Rachael Fee, MD;  Location: [facility];  Service: Endoscopy;  Laterality: N/A;   EUS N/A 10/05/2017   Procedure: UPPER ENDOSCOPIC ULTRASOUND (EUS) RADIAL;  Surgeon: Rachael Fee, MD;  Location: [facility];  Service: Endoscopy;  Laterality: N/A;   FOOT SURGERY Left    LOWER EXTREMITY ANGIOGRAM Bilateral 05/27/2015   Procedure: Lower Extremity Angiogram;  Surgeon: Iran Ouch, MD;  Location: [facility];  Service: Cardiovascular;  Laterality: Bilateral;   PATCH ANGIOPLASTY Right 10/10/2019   Procedure: PATCH ANGIOPLASTY USING Livia Snellen BIOLOGIC PATCH;  Surgeon: Cephus Shelling, MD;  Location: [facility] OR;  Service: Vascular;  Laterality: Right;   PERIPHERAL INTRAVASCULAR LITHOTRIPSY  09/28/2022   Procedure: PERIPHERAL INTRAVASCULAR LITHOTRIPSY;  Surgeon: Iran Ouch, MD;  [facility]: [facility];  Service: Cardiovascular;;   PERIPHERAL VASCULAR ATHERECTOMY Left 05/08/2019   Procedure: PERIPHERAL VASCULAR ATHERECTOMY;  Surgeon: Iran Ouch, MD;  [facility];  Service: Cardiovascular;  Laterality: Left;   PERIPHERAL VASCULAR  CATHETERIZATION N/A 05/27/2015   Procedure: Abdominal Aortogram;  Surgeon: Iran Ouch, MD;  [facility];  Service: Cardiovascular;  Laterality: N/A;   PERIPHERAL VASCULAR CATHETERIZATION Right 05/27/2015   Procedure: Peripheral Vascular Intervention;  Surgeon: Iran Ouch, MD;  [facility]: [facility];  Service: Cardiovascular;  Laterality: Right;  SFA   PERIPHERAL VASCULAR INTERVENTION  09/28/2022   Procedure: PERIPHERAL VASCULAR INTERVENTION;  Surgeon: Iran Ouch, MD;  [facility]: [facility];  Service: Cardiovascular;;   TOTAL KNEE ARTHROPLASTY Right 2011   TOTAL KNEE ARTHROPLASTY Left 05/22/2014   Procedure: LEFT TOTAL KNEE ARTHROPLASTY;  Surgeon: Ranee Gosselin, MD;  Location: [facility];  Service: Orthopedics;  Laterality: Left;   TRIGGER FINGER RELEASE Left 07/07/2021   Procedure: LEFT INDEX FINGER RELEASE TRIGGER FINGER/A-1 PULLEY / CARPOMETACARPAL INJECTION;  Surgeon: Marlyne Beards, MD;  Location: [facility];  Service: Orthopedics;  Laterality: Left;   TUBAL LIGATION  1980   Current Outpatient Medications on File Prior to Visit  Medication Sig Dispense Refill   acetaminophen (TYLENOL) 500 MG tablet Take 1,000 mg by mouth every 6 (six) hours as needed for moderate pain or headache.     albuterol (VENTOLIN HFA) 108 (90 Base) MCG/ACT inhaler Inhale 2 puffs into the lungs every 6 (six) hours as needed for wheezing or shortness of breath. 8.5 each 3   aspirin EC 81 MG tablet Take 1 tablet (81 mg total) by mouth daily. Swallow whole.     bisoprolol (ZEBETA) 5 MG tablet TAKE 1/2 TABLET(2.5 MG) BY MOUTH DAILY 45 tablet 3   Calcium Polycarbophil (FIBER-CAPS PO) Take 1 capsule by mouth daily.     clopidogrel (PLAVIX) 75 MG tablet TAKE 1 TABLET(75 MG) BY MOUTH DAILY 90 tablet 1   COLLAGEN PO Take 1,000 mg by mouth daily.     Cyanocobalamin (VITAMIN B-12) 5000 MCG TBDP Take 5,000 mcg by mouth daily.     diclofenac sodium (VOLTAREN) 1 % GEL  Apply 2 g topically 4 (four) times daily. (Patient taking differently: Apply 2 g topically 4 (four) times daily as needed (for hand pain).) 100 g 0   doxycycline (VIBRA-TABS) 100 MG tablet Take 1 tablet (100 mg total) by mouth 2 (two) times daily. 14 tablet 0   FARXIGA 10 MG TABS tablet TAKE 1 TABLET BY MOUTH EVERY DAY BEFORE BREAKFAST 90 tablet 1   fluticasone-salmeterol (ADVAIR DISKUS) 250-50 MCG/ACT AEPB Inhale 1 puff into the lungs in the morning and at bedtime. 60 each 11   HYDROcodone-acetaminophen (NORCO/VICODIN) 5-325 MG tablet Take 1 tablet by mouth every 6 (six) hours as needed for moderate pain (pain score 4-6). Patient needs to schedule office visit 130 tablet 0   isosorbide mononitrate (IMDUR) 60 MG 24 hr tablet TAKE 1 TABLET BY MOUTH EVERY DAY 90 tablet 1   ketorolac (ACULAR) 0.5 % ophthalmic solution Place 1 drop into the left eye 4 (four) times daily.     lipase/protease/amylase (CREON) 36000 UNITS CPEP capsule Take 3 capsules (108,000 Units total) by mouth 3 (three) times daily with meals. May also  take 1 capsule (36,000 Units total) as needed (with snacks). 200 capsule 3   loratadine (CLARITIN) 10 MG tablet Take 10 mg by mouth daily.     metFORMIN (GLUCOPHAGE) 1000 MG tablet Take 1 tablet (1,000 mg total) by mouth 2 (two) times daily with a meal. 180 tablet 0   methocarbamol (ROBAXIN) 500 MG tablet TAKE 1 TABLET BY MOUTH EVERY 6 HOURS AS NEEDED FOR MUSCLE SPASM 120 tablet 1   Multiple Vitamin (MULTIVITAMIN) tablet Take 1 tablet by mouth daily.     nitroGLYCERIN (NITROSTAT) 0.4 MG SL tablet Place 1 tablet under the tongue every 5 (five) minutes as needed for chest pain.     ofloxacin (OCUFLOX) 0.3 % ophthalmic solution Place 1 drop into the left eye 4 (four) times daily.     ONETOUCH ULTRA TEST test strip SMARTSIG:Via Meter 1-3 Times Daily     pantoprazole (PROTONIX) 40 MG tablet Take 1 tablet (40 mg total) by mouth daily. 90 tablet 1   pioglitazone (ACTOS) 30 MG tablet TAKE 1 TABLET  BY MOUTH EVERY DAY 90 tablet 1   prednisoLONE acetate (PRED FORTE) 1 % ophthalmic suspension Place 1 drop into the left eye 4 (four) times daily.     pregabalin (LYRICA) 50 MG capsule Take 1 capsule (50 mg total) by mouth 3 (three) times daily. 90 capsule 1   Probiotic Product (TRUBIOTICS PO) Take 1 capsule by mouth daily.     promethazine (PHENERGAN) 25 MG tablet TAKE 1 TABLET(25 MG) BY MOUTH EVERY 8 HOURS AS NEEDED FOR NAUSEA OR VOMITING 30 tablet 0   promethazine-dextromethorphan (PROMETHAZINE-DM) 6.25-15 MG/5ML syrup Take 5 mLs by mouth 4 (four) times daily as needed for cough. 118 mL 0   rosuvastatin (CRESTOR) 40 MG tablet TAKE 1 TABLET(40 MG) BY MOUTH EVERY NIGHT 90 tablet 1   sitaGLIPtin (JANUVIA) 100 MG tablet TAKE 1 TABLET BY MOUTH EVERY DAY 90 tablet 1   No current facility-administered medications on file prior to visit.   Allergies  Allergen Reactions   Gabapentin Other (See Comments)    Brain shakes   Glucotrol [Glipizide] Rash and Other (See Comments)    Red rash   Influenza Vaccines Other (See Comments)    Significant arm soreness requiring 1 year of physical therapy   Latex Rash   Nortriptyline Other (See Comments)    BRAIN SHAKE   Tetanus Toxoid Hives, Swelling, Rash and Other (See Comments)    Swelling to site of injection with rash and fever   Other Itching, Other (See Comments) and Cough    Patient is highly allergic to Cats and she begins to break out in rash and turns red if someone caring for her has cats at home.  Patient takes Claritin for this type of reaction.  Patient stated that if it goes untreated then he begins to have difficulty breathing   Prednisone & Diphenhydramine     Pt get really headaches   Invokana [Canagliflozin] Other (See Comments)    Yeast Infections   Social History   Socioeconomic History   Marital status: Married    Spouse name: Not on file   Number of children: Not on file   Years of education: 10   Highest education level: Not  on file  Occupational History   Not on file  Tobacco Use   Smoking status: Former    Current packs/day: 0.00    Types: Cigarettes    Quit date: 12/13/2008    Years since quitting: 14.3  Smokeless tobacco: Never  Vaping Use   Vaping status: Never Used  Substance and Sexual Activity   Alcohol use: No    Comment: Quit drinking around year 2000   Drug use: No    Comment: Patient quit smoking around 2011   Sexual activity: Yes    Partners: Male    Birth control/protection: Post-menopausal  Other Topics Concern   Not on file  Social History Narrative   Denies any IV drug use or marijuana use.  Former smoker for 30 years, quit Nov. 2010. She is married with 2 children. Disabled from knee, not working.   Social Drivers of Health   Financial Resource Strain: Medium Risk (09/28/2018)   Overall Financial Resource Strain (CARDIA)    Difficulty of Paying Living Expenses: Somewhat hard  Food Insecurity: Not on file  Transportation Needs: No Transportation Needs (09/28/2018)   PRAPARE - Administrator, Civil Service (Medical): No    Lack of Transportation (Non-Medical): No  Physical Activity: Not on file  Stress: Not on file  Social Connections: Unknown (09/28/2018)   Social Connection and Isolation Panel [NHANES]    Frequency of Communication with Friends and Family: Once a week    Frequency of Social Gatherings with Friends and Family: Once a week    Attends Religious Services: Not on Marketing executive or Organizations: Not on file    Attends Banker Meetings: Not on file    Marital Status: Married  Intimate Partner Violence: Not At Risk (09/28/2018)   Humiliation, Afraid, Rape, and Kick questionnaire    Fear of Current or Ex-Partner: No    Emotionally Abused: No    Physically Abused: No    Sexually Abused: No   Family History  Problem Relation Age of Onset   CVA Mother    Emphysema Father    Breast cancer Sister 21   Colon cancer Neg Hx       Review of Systems  All other systems reviewed and are negative.      Objective:   Physical Exam Vitals reviewed.  Constitutional:      General: She is not in acute distress.    Appearance: She is well-developed. She is not diaphoretic.  Neck:     Vascular: No JVD.  Cardiovascular:     Rate and Rhythm: Normal rate and regular rhythm.     Heart sounds: Normal heart sounds. No murmur heard. Pulmonary:     Effort: Pulmonary effort is normal.  Chest:     Chest wall: No crepitus.         Assessment & Plan:  Type 2 diabetes mellitus with other circulatory complication, without long-term current use of insulin (HCC) Diabetes is poorly controlled.  I recommended adding insulin and discontinuing Januvia and Actos.  Patient refuses insulin and plans to make dietary changes recheck blood work in 3 months.

## 2023-04-16 ENCOUNTER — Ambulatory Visit
Admission: EM | Admit: 2023-04-16 | Discharge: 2023-04-16 | Disposition: A | Discharge status: Home / Self Care | Attending: Nurse Practitioner | Admitting: Nurse Practitioner

## 2023-04-16 ENCOUNTER — Ambulatory Visit (HOSPITAL_COMMUNITY)
Admission: RE | Admit: 2023-04-16 | Discharge: 2023-04-16 | Disposition: A | Discharge status: Home / Self Care | Source: Ambulatory Visit | Attending: Nurse Practitioner | Admitting: Nurse Practitioner

## 2023-04-16 DIAGNOSIS — M7732 Calcaneal spur, left foot: Secondary | ICD-10-CM | POA: Diagnosis not present

## 2023-04-16 DIAGNOSIS — W010XXA Fall on same level from slipping, tripping and stumbling without subsequent striking against object, initial encounter: Secondary | ICD-10-CM | POA: Insufficient documentation

## 2023-04-16 DIAGNOSIS — M79675 Pain in left toe(s): Secondary | ICD-10-CM | POA: Insufficient documentation

## 2023-04-16 DIAGNOSIS — M7989 Other specified soft tissue disorders: Secondary | ICD-10-CM | POA: Diagnosis not present

## 2023-04-16 DIAGNOSIS — M79672 Pain in left foot: Secondary | ICD-10-CM | POA: Diagnosis not present

## 2023-04-16 DIAGNOSIS — M19072 Primary osteoarthritis, left ankle and foot: Secondary | ICD-10-CM | POA: Diagnosis not present

## 2023-04-16 DIAGNOSIS — Z043 Encounter for examination and observation following other accident: Secondary | ICD-10-CM | POA: Diagnosis not present

## 2023-04-16 NOTE — ED Triage Notes (Addendum)
 Pt reports left foot pain, pt states she tripped over something and fell last night left great is bruised the entire foot has swelling.

## 2023-04-16 NOTE — Discharge Instructions (Addendum)
 Go to [facility] for an xray of your foot.  You will need to go to the emergency department.  Please let them know that you are there for an x-ray only.  You will be contacted once the xray results are received.  You also have access to your results via MyChart. May take over-the-counter Tylenol as needed for pain or discomfort. RICE therapy, rest, ice, compression, and elevation.  Elevate the left foot above the level of the heart is much as possible to help with swelling.  Apply ice for 20 minutes, remove for 1 hour, repeat as needed. Recommend staying off of the foot is much as possible. Wear the CAM boot to allow for additional compression and support. I would like for you to follow-up with orthopedics within the next 24 to 48 hours.  I have given you information for [facility] and for [facility]o care [facility]. Follow-up as needed.

## 2023-04-16 NOTE — ED Provider Notes (Signed)
 [facility]    CSN: [id number] Arrival date & time: 04/16/23  1234      History   Chief Complaint Chief Complaint  Patient presents with   Toe Pain    HPI [name] is a 68 y.o. female.   The history is provided by the patient.   Patient presents for complaints of left foot pain after she tripped over rugs at home in her floor.  She presents with bruising and swelling of the left great toe, with generalized swelling of the left foot.  Patient states that she has taken over-the-counter analgesics for pain.  She has been walking on the foot as well.  Denies inability to bear weight, numbness, or tingling.  Past Medical History:  Diagnosis Date   Aortic atherosclerosis (HCC)    Asthma    CAD (coronary artery disease)    a. LHC 2011: Diffuse distal and branch vessel disease - patient managed medically, no interventional options. b. Nuc 03/2014 - low risk, no ischemia.   Complication of anesthesia    slow to wake up with last surgery in 2011    Diabetes mellitus with circulatory complication (HCC)    Diverticulosis    Dyslipidemia    GERD (gastroesophageal reflux disease)    Headache    hx of migraines    Hypertension    Osteoarthritis    severe right knee  R TKR   PAD (peripheral artery disease) (HCC)    a. Severe stenosis mid right SFA s/p atherectomy 03/25/09. b. peripheral angiography in 12/2011 which showed only about 50% diffuse right SFA stenosis.   Pancreatitis 2003   Renal artery stenosis (HCC)    a. LHC 2011 - 40-50% left RAS.   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   Chronic pancreatitis (HCC) 09/28/2018    Asthma with COPD (HCC)    CKD (chronic kidney disease), stage III (HCC)    Chronic pain syndrome 03/19/2018   Renal artery stenosis (HCC)    CAD (coronary artery disease)    Diabetes mellitus with circulatory complication (HCC)    Dyslipidemia    History of bilateral knee replacement 05/22/2014   Cutaneous skin tags 09/09/2012   Bilateral leg pain 07/20/2012   Arthritis of knee 10/25/2011   GERD (gastroesophageal reflux disease) 11/04/2010   PAD (peripheral artery disease) (HCC) 01/30/2009   Diabetes mellitus, type II (HCC) 01/02/2009   TOBACCO ABUSE 01/02/2009   Essential hypertension 01/02/2009    Past Surgical History:  Procedure Laterality Date   ABDOMINAL AORTAGRAM N/A 12/21/2011   Procedure: ABDOMINAL Ronny Flurry;  Surgeon: Iran Ouch, MD;  [facility];  Service: Cardiovascular;  Laterality: N/A;   ABDOMINAL AORTOGRAM W/LOWER EXTREMITY Bilateral 05/08/2019   Procedure: ABDOMINAL AORTOGRAM W/LOWER EXTREMITY;  Surgeon: Iran Ouch, MD;  [facility];  Service: Cardiovascular;  Laterality: Bilateral;   ABDOMINAL AORTOGRAM W/LOWER EXTREMITY N/A 09/28/2022   Procedure: ABDOMINAL AORTOGRAM W/LOWER EXTREMITY;  Surgeon: Iran Ouch, MD;  [facility];  Service: Cardiovascular;  Laterality: N/A;   CARDIAC CATHETERIZATION     ENDARTERECTOMY FEMORAL Right 10/10/2019  Procedure: RIGHT FEMORAL ENDARTERECTOMY;  Surgeon: Cephus Shelling, MD;  Location: [facility] OR;  Service: Vascular;  Laterality: Right;   ESOPHAGOGASTRODUODENOSCOPY (EGD) WITH PROPOFOL N/A 10/05/2017   Procedure: ESOPHAGOGASTRODUODENOSCOPY (EGD) WITH PROPOFOL;  Surgeon: Rachael Fee, MD;  [facility];  Service: Endoscopy;  Laterality: N/A;   EUS N/A 10/05/2017   Procedure: UPPER ENDOSCOPIC ULTRASOUND (EUS) RADIAL;  Surgeon: Rachael Fee, MD;  [facility];  Service: Endoscopy;  Laterality: N/A;   FOOT SURGERY Left    LOWER EXTREMITY ANGIOGRAM  Bilateral 05/27/2015   Procedure: Lower Extremity Angiogram;  Surgeon: Iran Ouch, MD;  [facility];  Service: Cardiovascular;  Laterality: Bilateral;   PATCH ANGIOPLASTY Right 10/10/2019   Procedure: PATCH ANGIOPLASTY USING Livia Snellen BIOLOGIC PATCH;  Surgeon: Cephus Shelling, MD;  Location: [facility] OR;  Service: Vascular;  Laterality: Right;   PERIPHERAL INTRAVASCULAR LITHOTRIPSY  09/28/2022   Procedure: PERIPHERAL INTRAVASCULAR LITHOTRIPSY;  Surgeon: Iran Ouch, MD;  [facility];  Service: Cardiovascular;;   PERIPHERAL VASCULAR ATHERECTOMY Left 05/08/2019   Procedure: PERIPHERAL VASCULAR ATHERECTOMY;  Surgeon: Iran Ouch, MD;  [facility];  Service: Cardiovascular;  Laterality: Left;   PERIPHERAL VASCULAR CATHETERIZATION N/A 05/27/2015   Procedure: Abdominal Aortogram;  Surgeon: Iran Ouch, MD;  [facility];  Service: Cardiovascular;  Laterality: N/A;   PERIPHERAL VASCULAR CATHETERIZATION Right 05/27/2015   Procedure: Peripheral Vascular Intervention;  Surgeon: Iran Ouch, MD;  [facility];  Service: Cardiovascular;  Laterality: Right;  SFA   PERIPHERAL VASCULAR INTERVENTION  09/28/2022   Procedure: PERIPHERAL VASCULAR INTERVENTION;  Surgeon: Iran Ouch, MD;  [facility];  Service: Cardiovascular;;   TOTAL KNEE ARTHROPLASTY Right 2011   TOTAL KNEE ARTHROPLASTY Left 05/22/2014   Procedure: LEFT TOTAL KNEE ARTHROPLASTY;  Surgeon: Ranee Gosselin, MD;  [facility];  Service: Orthopedics;  Laterality: Left;   TRIGGER FINGER RELEASE Left 07/07/2021   Procedure: LEFT INDEX FINGER RELEASE TRIGGER FINGER/A-1 PULLEY / CARPOMETACARPAL INJECTION;  Surgeon: Marlyne Beards, MD;  Location: [facility];  Service: Orthopedics;  Laterality: Left;   TUBAL LIGATION  1980    OB History   No obstetric history on file.      Home Medications    Prior to  Admission medications   Medication Sig Start Date End Date Taking? Authorizing Provider  acetaminophen (TYLENOL) 500 MG tablet Take 1,000 mg by mouth every 6 (six) hours as needed for moderate pain or headache.    [provider]  albuterol (VENTOLIN HFA) 108 (90 Base) MCG/ACT inhaler Inhale 2 puffs into the lungs every 6 (six) hours as needed for wheezing or shortness of breath. 02/14/23   Donita Brooks, MD  aspirin EC 81 MG tablet Take 1 tablet (81 mg total) by mouth daily. Swallow whole. 09/28/22 09/28/23  Iran Ouch, MD  bisoprolol (ZEBETA) 5 MG tablet TAKE 1/2 TABLET(2.5 MG) BY MOUTH DAILY 08/04/22   Donita Brooks, MD  Calcium Polycarbophil (FIBER-CAPS PO) Take 1 capsule by mouth daily.    [provider]  clopidogrel (PLAVIX) 75 MG tablet TAKE 1 TABLET(75 MG) BY MOUTH DAILY 11/01/22   Donita Brooks, MD  COLLAGEN PO Take 1,000 mg by mouth daily.    [provider]  Cyanocobalamin (VITAMIN B-12) 5000 MCG TBDP Take 5,000 mcg by mouth daily.    [provider]  diclofenac sodium (VOLTAREN) 1 % GEL  Apply 2 g topically 4 (four) times daily. Patient taking differently: Apply 2 g topically 4 (four) times daily as needed (for hand pain). 05/04/16   Donita Brooks, MD  doxycycline (VIBRA-TABS) 100 MG tablet Take 1 tablet (100 mg total) by mouth 2 (two) times daily. 10/21/22   Donita Brooks, MD  FARXIGA 10 MG TABS tablet TAKE 1 TABLET BY MOUTH EVERY DAY BEFORE BREAKFAST 11/15/22   Donita Brooks, MD  fluticasone-salmeterol (ADVAIR DISKUS) 250-50 MCG/ACT AEPB Inhale 1 puff into the lungs in the morning and at bedtime. 11/02/22   Donita Brooks, MD  HYDROcodone-acetaminophen (NORCO/VICODIN) 5-325 MG tablet Take 1 tablet by mouth every 6 (six) hours as needed for moderate pain (pain score 4-6). Patient needs to schedule office visit 04/13/23   Donita Brooks, MD  isosorbide mononitrate (IMDUR) 60 MG 24 hr tablet TAKE 1 TABLET BY MOUTH EVERY DAY  12/22/22   Donita Brooks, MD  ketorolac (ACULAR) 0.5 % ophthalmic solution Place 1 drop into the left eye 4 (four) times daily. 04/09/23   [provider]  lipase/protease/amylase (CREON) 36000 UNITS CPEP capsule Take 3 capsules (108,000 Units total) by mouth 3 (three) times daily with meals. May also take 1 capsule (36,000 Units total) as needed (with snacks). 02/17/23   Donita Brooks, MD  loratadine (CLARITIN) 10 MG tablet Take 10 mg by mouth daily.    [provider]  metFORMIN (GLUCOPHAGE) 1000 MG tablet Take 1 tablet (1,000 mg total) by mouth 2 (two) times daily with a meal. 02/21/23   Donita Brooks, MD  methocarbamol (ROBAXIN) 500 MG tablet TAKE 1 TABLET BY MOUTH EVERY 6 HOURS AS NEEDED FOR MUSCLE SPASM 03/29/23   Donita Brooks, MD  Multiple Vitamin (MULTIVITAMIN) tablet Take 1 tablet by mouth daily.    [provider]  nitroGLYCERIN (NITROSTAT) 0.4 MG SL tablet Place 1 tablet under the tongue every 5 (five) minutes as needed for chest pain. 09/26/18   [provider]  ofloxacin (OCUFLOX) 0.3 % ophthalmic solution Place 1 drop into the left eye 4 (four) times daily. 04/09/23   [provider]  Koren Bound TEST test strip SMARTSIG:Via Meter 1-3 Times Daily 07/19/22   [provider]  pantoprazole (PROTONIX) 40 MG tablet Take 1 tablet (40 mg total) by mouth daily. 10/21/22   Donita Brooks, MD  pioglitazone (ACTOS) 30 MG tablet TAKE 1 TABLET BY MOUTH EVERY DAY 11/15/22   Donita Brooks, MD  prednisoLONE acetate (PRED FORTE) 1 % ophthalmic suspension Place 1 drop into the left eye 4 (four) times daily. 04/09/23   [provider]  pregabalin (LYRICA) 50 MG capsule Take 1 capsule (50 mg total) by mouth 3 (three) times daily. 11/01/22   Donita Brooks, MD  Probiotic Product (TRUBIOTICS PO) Take 1 capsule by mouth daily.    [provider]  promethazine (PHENERGAN) 25 MG tablet TAKE 1 TABLET(25 MG) BY MOUTH EVERY 8  HOURS AS NEEDED FOR NAUSEA OR VOMITING 02/16/23   Donita Brooks, MD  promethazine-dextromethorphan (PROMETHAZINE-DM) 6.25-15 MG/5ML syrup Take 5 mLs by mouth 4 (four) times daily as needed for cough. 03/30/23   Donita Brooks, MD  rosuvastatin (CRESTOR) 40 MG tablet TAKE 1 TABLET(40 MG) BY MOUTH EVERY NIGHT 11/07/22   Donita Brooks, MD  sitaGLIPtin (JANUVIA) 100 MG tablet TAKE 1 TABLET BY MOUTH EVERY DAY 10/21/22   Donita Brooks, MD    Family History Family History  Problem Relation Age of Onset   CVA Mother    Emphysema Father    Breast cancer Sister 48   Colon cancer Neg Hx     Social History Social History   Tobacco Use   Smoking status: Former    Current packs/day: 0.00    Types: Cigarettes    Quit date: 12/13/2008    Years since quitting: 14.3   Smokeless tobacco: Never  Vaping Use   Vaping status: Never Used  Substance Use Topics   Alcohol use: No    Comment: Quit drinking around year 2000   Drug use: No    Comment: Patient quit smoking around 2011     Allergies   Gabapentin, Glucotrol [glipizide], Influenza vaccines, Latex, Nortriptyline, Tetanus toxoid, Other, Prednisone & diphenhydramine, and Invokana [canagliflozin]   Review of Systems Review of Systems Per HPI  Physical Exam Triage Vital Signs ED Triage Vitals  Encounter Vitals Group     BP 04/16/23 1308 126/68     Systolic BP Percentile --      Diastolic BP Percentile --      Pulse Rate 04/16/23 1308 87     Resp 04/16/23 1308 20     Temp 04/16/23 1308 97.7 F (36.5 C)     Temp Source 04/16/23 1308 Oral     SpO2 04/16/23 1308 95 %     Weight --      Height --      Head Circumference --      Peak Flow --      Pain Score 04/16/23 1311 8     Pain Loc --      Pain Education --      Exclude from Growth Chart --    No data found.  Updated Vital Signs BP 126/68 (BP Location: Right Arm)   Pulse 87   Temp 97.7 F (36.5 C) (Oral)   Resp 20   SpO2 95%   Visual Acuity Right Eye  Distance:   Left Eye Distance:   Bilateral Distance:    Right Eye Near:   Left Eye Near:    Bilateral Near:     Physical Exam Vitals and nursing note reviewed.  Constitutional:      General: She is not in acute distress.    Appearance: Normal appearance.  HENT:     Head: Normocephalic.  Eyes:     Extraocular Movements: Extraocular movements intact.     Pupils: Pupils are equal, round, and reactive to light.  Pulmonary:     Effort: Pulmonary effort is normal.  Musculoskeletal:     Left foot: Swelling (left foot) present.     Comments: Ecchymosis of left great toe. Generalized swelling of left foot including toes. No obvious deformity. +2 DP/PT pulse.  Neurovascular status is intact.  Skin:    General: Skin is warm and dry.  Neurological:     General: No focal deficit present.     Mental Status: She is alert and oriented to person, place, and time.  Psychiatric:        Mood and Affect: Mood normal.        Behavior: Behavior normal.      UC Treatments / Results  Labs (all labs ordered are listed, but only abnormal results are displayed) Labs Reviewed - No data to display  EKG   Radiology DG Foot Complete Left Result Date: 04/16/2023 CLINICAL DATA:  Fall last night with swelling in left great toe pain. EXAM: LEFT FOOT - COMPLETE  3+ VIEW COMPARISON:  05/05/2022, without report FINDINGS: Degenerative changes of the first metatarsophalangeal joint. No acute fracture or dislocation. Mild midfoot and forefoot soft tissue swelling. Small calcaneal spur. IMPRESSION: No acute osseous abnormality. Electronically Signed   By: Jeronimo Greaves M.D.   On: 04/16/2023 15:31    Procedures Procedures (including critical care time)  Medications Ordered in UC Medications - No data to display  Initial Impression / Assessment and Plan / UC Course  I have reviewed the triage vital signs and the nursing notes.  Pertinent labs & imaging results that were available during my care of the  patient were reviewed by me and considered in my medical decision making (see chart for details).  Patient presents after a left foot injury after a fall.  Patient with moderate ecchymosis noted to the left great toe with generalized swelling of the left foot.  CAM boot provided for additional compression and support.Cathlean Sauer of the left foot is pending.  Advised patient, weightbearing only as tolerated.  Supportive care recommendations were provided and discussed with the patient to include RICE therapy, and over-the-counter analgesics for pain or discomfort.  Patient was advised to follow-up with orthopedics within the next 24 to 48 hours for reevaluation.  Patient was given information for [facility] and for [employer].  Patient was in agreement with this plan of care and verbalized understanding.  All questions were answered.  Patient stable for discharge.  Update 1601pm: Reviewed x-ray results.  Call patient to discuss x-ray results.  Verified patient with 2 patient identifiers.  Patient was advised that x-ray was negative for fracture or dislocation.  Patient was advised to continue with current treatment plan, recommending that she follow-up with orthopedics for reevaluation.  Patient was in agreement with this plan of care and verbalized understanding.  All questions were answered.   Final Clinical Impressions(s) / UC Diagnoses   Final diagnoses:  Pain and swelling of toe of left foot  Swelling of left foot     Discharge Instructions      Go to [facility] for an xray of your foot.  You will need to go to the emergency department.  Please let them know that you are there for an x-ray only.  You will be contacted once the xray results are received.  You also have access to your results via MyChart. May take over-the-counter Tylenol as needed for pain or discomfort. RICE therapy, rest, ice, compression, and elevation.  Elevate the left foot above the level of the heart  is much as possible to help with swelling.  Apply ice for 20 minutes, remove for 1 hour, repeat as needed. Recommend staying off of the foot is much as possible. Wear the CAM boot to allow for additional compression and support. I would like for you to follow-up with orthopedics within the next 24 to 48 hours.  I have given you information for [facility] and for [facility] [place]. Follow-up as needed.     ED Prescriptions   None    PDMP not reviewed this encounter.   Abran Cantor, NP 04/16/23 [phone]

## 2023-04-24 ENCOUNTER — Other Ambulatory Visit: Payer: Self-pay

## 2023-04-24 ENCOUNTER — Other Ambulatory Visit: Payer: Self-pay | Admitting: Cardiovascular Disease

## 2023-04-24 ENCOUNTER — Telehealth: Payer: Self-pay | Admitting: Family Medicine

## 2023-04-24 DIAGNOSIS — M1710 Unilateral primary osteoarthritis, unspecified knee: Secondary | ICD-10-CM

## 2023-04-24 DIAGNOSIS — I739 Peripheral vascular disease, unspecified: Secondary | ICD-10-CM

## 2023-04-24 DIAGNOSIS — M79604 Pain in right leg: Secondary | ICD-10-CM

## 2023-04-24 MED ORDER — PREGABALIN 50 MG PO CAPS: 50.0000 mg | ORAL_CAPSULE | Freq: Three times a day (TID) | ORAL | 2 refills | Status: DC

## 2023-04-24 NOTE — Telephone Encounter (Signed)
 Prescription Request  04/24/2023  LOV: 04/13/2023  What is the name of the medication or equipment?   pregabalin (LYRICA) 50 MG capsule   Have you contacted your pharmacy to request a refill? Yes   Which pharmacy would you like this sent to?  [id number] [place], [place] - [address] [place] [address] Phone: [phone] Fax: [phone]    Patient notified that their request is being sent to the clinical staff for review and that they should receive a response within 2 business days.   Please advise pharmacist.

## 2023-04-26 DIAGNOSIS — H2511 Age-related nuclear cataract, right eye: Secondary | ICD-10-CM | POA: Diagnosis not present

## 2023-04-28 DIAGNOSIS — H2512 Age-related nuclear cataract, left eye: Secondary | ICD-10-CM | POA: Diagnosis not present

## 2023-04-28 DIAGNOSIS — H25811 Combined forms of age-related cataract, right eye: Secondary | ICD-10-CM | POA: Diagnosis not present

## 2023-05-03 ENCOUNTER — Telehealth: Payer: Self-pay | Admitting: Family Medicine

## 2023-05-03 NOTE — Telephone Encounter (Signed)
 Prescription Request  05/03/2023  LOV: 04/13/2023  What is the name of the medication or equipment?   promethazine (PHENERGAN) 25 MG tablet   Have you contacted your pharmacy to request a refill? Yes   Which pharmacy would you like this sent to?  [id number] [place], [place] - [address] [place] [address] Phone: [phone] Fax: [phone]    Patient notified that their request is being sent to the clinical staff for review and that they should receive a response within 2 business days.   Please advise pharmacist.

## 2023-05-04 ENCOUNTER — Other Ambulatory Visit: Payer: Self-pay | Admitting: Family Medicine

## 2023-05-04 MED ORDER — PROMETHAZINE HCL 25 MG PO TABS: ORAL_TABLET | ORAL | 2 refills | Status: DC

## 2023-05-15 ENCOUNTER — Other Ambulatory Visit: Payer: Self-pay | Admitting: Family Medicine

## 2023-05-15 NOTE — Telephone Encounter (Signed)
 Copied from CRM [id number]. Topic: Clinical - Medication Refill >> May 15, 2023  2:28 PM Yolanda T wrote: Most Recent Primary Care Visit:  Provider: Lynnea Ferrier T  Department[facility]  Visit Type: OFFICE VISIT  Date: 04/13/2023  Medication: HYDROcodone-acetaminophen (NORCO/VICODIN) 5-325 MG tablet  Has the patient contacted their pharmacy? No  Is this the correct pharmacy for this prescription? Yes If no, delete pharmacy and type the correct one.  This is the patient's preferred pharmacy:  [id number] [place], [place] - [address] [place] [address] Phone: [phone] Fax: [phone]   Has the prescription been filled recently? Yes  Is the patient out of the medication? Yes  Has the patient been seen for an appointment in the last year OR does the patient have an upcoming appointment? Yes  Can we respond through MyChart? No  Agent: Please be advised that Rx refills may take up to 3 business days. We ask that you follow-up with your pharmacy.

## 2023-05-17 NOTE — Telephone Encounter (Signed)
 Requested medication (s) are due for refill today: yes  Requested medication (s) are on the active medication list: yes  Last refill:  04/13/23 #130  Future visit scheduled: yes  Notes to clinic:  med not delegated to NT to RF   Requested Prescriptions  Pending Prescriptions Disp Refills   HYDROcodone-acetaminophen (NORCO/VICODIN) 5-325 MG tablet 130 tablet 0    Sig: Take 1 tablet by mouth every 6 (six) hours as needed for moderate pain (pain score 4-6). Patient needs to schedule office visit     Not Delegated - Analgesics:  Opioid Agonist Combinations Failed - 05/17/2023  1:47 PM      Failed - This refill cannot be delegated      Failed - Urine Drug Screen completed in last 360 days      Passed - Valid encounter within last 3 months    Recent Outpatient Visits           1 month ago Type 2 diabetes mellitus with other circulatory complication, without long-term current use of insulin (HCC)   [facility] Pickard, Priscille Heidelberg, MD   1 month ago Acute cough   [place] [facility] Tanya Nones, Priscille Heidelberg, MD   6 months ago Visit for suture removal   [place] [facility] Donita Brooks, MD   6 months ago Type 2 diabetes mellitus with diabetic neuropathy, unspecified whether long term insulin use [facility])   [place] [facility] Pickard, Priscille Heidelberg, MD   11 months ago Colon cancer screening   [facility] Pickard, Priscille Heidelberg, MD

## 2023-05-18 MED ORDER — HYDROCODONE-ACETAMINOPHEN 5-325 MG PO TABS: 1.0000 | ORAL_TABLET | Freq: Four times a day (QID) | ORAL | 0 refills | Status: DC | PRN

## 2023-05-19 ENCOUNTER — Ambulatory Visit
Admission: RE | Admit: 2023-05-19 | Discharge: 2023-05-19 | Disposition: A | Discharge status: Home / Self Care | Payer: HMO | Source: Ambulatory Visit | Attending: Family Medicine | Admitting: Family Medicine

## 2023-05-19 DIAGNOSIS — N958 Other specified menopausal and perimenopausal disorders: Secondary | ICD-10-CM | POA: Diagnosis not present

## 2023-05-19 DIAGNOSIS — Z780 Asymptomatic menopausal state: Secondary | ICD-10-CM

## 2023-06-05 ENCOUNTER — Other Ambulatory Visit: Payer: Self-pay

## 2023-06-05 DIAGNOSIS — E1159 Type 2 diabetes mellitus with other circulatory complications: Secondary | ICD-10-CM

## 2023-06-05 NOTE — Telephone Encounter (Signed)
 Prescription Request  06/05/2023  LOV: 04/13/23  What is the name of the medication or equipment? sitaGLIPtin  (JANUVIA ) 100 MG tablet [[id number]]   Have you contacted your pharmacy to request a refill? Yes   Which pharmacy would you like this sent to?  [id number] [name], [place] - [address] [place] [address] Phone: [phone] Fax: [phone]    Patient notified that their request is being sent to the clinical staff for review and that they should receive a response within 2 business days.   Please advise at [place] [phone]

## 2023-06-06 MED ORDER — SITAGLIPTIN PHOSPHATE 100 MG PO TABS: ORAL_TABLET | ORAL | 0 refills | Status: DC

## 2023-06-06 NOTE — Telephone Encounter (Signed)
 Requested Prescriptions  Pending Prescriptions Disp Refills   sitaGLIPtin  (JANUVIA ) 100 MG tablet 90 tablet 0    Sig: TAKE 1 TABLET BY MOUTH EVERY DAY     Endocrinology:  Diabetes - DPP-4 Inhibitors Failed - 06/06/2023  8:07 AM      Failed - HBA1C is between 0 and 7.9 and within 180 days    Hgb A1c MFr Bld  Date Value Ref Range Status  04/07/2023 8.3 (H) <5.7 % of total Hgb Final    Comment:    For someone without known diabetes, a hemoglobin A1c value of 6.5% or greater indicates that they may have  diabetes and this should be confirmed with a follow-up  test. . For someone with known diabetes, a value <7% indicates  that their diabetes is well controlled and a value  greater than or equal to 7% indicates suboptimal  control. A1c targets should be individualized based on  duration of diabetes, age, comorbid conditions, and  other considerations. . Currently, no consensus exists regarding use of hemoglobin A1c for diagnosis of diabetes for children. .          Failed - Cr in normal range and within 360 days    Creat  Date Value Ref Range Status  04/07/2023 1.34 (H) 0.50 - 1.05 mg/dL Final   Creatinine, Urine  Date Value Ref Range Status  06/03/2022 33 20 - 275 mg/dL Final         Passed - Valid encounter within last 6 months    Recent Outpatient Visits           1 month ago Type 2 diabetes mellitus with other circulatory complication, without long-term current use of insulin  [facility])   [place] [facility] Pickard, Cisco Crest, MD   2 months ago Acute cough   [place] [facility] Cheril Cork, Cisco Crest, MD   6 months ago Visit for suture removal   [place] [facility] Austine Lefort, MD   7 months ago Type 2 diabetes mellitus with diabetic neuropathy, unspecified whether long term insulin  use [facility])   [place] [facility] Austine Lefort, MD   1 year ago Colon cancer screening   [place]  [facility] Pickard, Cisco Crest, MD

## 2023-06-13 ENCOUNTER — Other Ambulatory Visit: Payer: Self-pay

## 2023-06-13 ENCOUNTER — Ambulatory Visit: Payer: Self-pay

## 2023-06-13 ENCOUNTER — Other Ambulatory Visit: Payer: Self-pay | Admitting: Family Medicine

## 2023-06-13 DIAGNOSIS — I739 Peripheral vascular disease, unspecified: Secondary | ICD-10-CM

## 2023-06-13 DIAGNOSIS — E1159 Type 2 diabetes mellitus with other circulatory complications: Secondary | ICD-10-CM

## 2023-06-13 DIAGNOSIS — M79604 Pain in right leg: Secondary | ICD-10-CM

## 2023-06-13 MED ORDER — HYDROCODONE-ACETAMINOPHEN 5-325 MG PO TABS: 1.0000 | ORAL_TABLET | Freq: Four times a day (QID) | ORAL | 0 refills | Status: DC | PRN

## 2023-06-13 MED ORDER — METHOCARBAMOL 500 MG PO TABS: ORAL_TABLET | ORAL | 1 refills | Status: DC

## 2023-06-13 MED ORDER — METFORMIN HCL 1000 MG PO TABS: 1000.0000 mg | ORAL_TABLET | Freq: Two times a day (BID) | ORAL | 2 refills | Status: DC

## 2023-06-13 MED ORDER — METFORMIN HCL 1000 MG PO TABS: 1000.0000 mg | ORAL_TABLET | Freq: Two times a day (BID) | ORAL | 0 refills | Status: DC

## 2023-06-13 NOTE — Telephone Encounter (Signed)
 [facility] . Pt states that she is also down to her last week of Norco, and has always been advised to call a week out. Pt would like refills on all three.  Reason for Disposition  [1] Prescription refill request for NON-ESSENTIAL medicine (i.e., no harm to patient if med not taken) AND [2] triager unable to refill per department policy  Answer Assessment - Initial Assessment Questions 1. DRUG NAME: "What medicine do you need to have refilled?"     Norco, metformin , and robaxin  2. REFILLS REMAINING: "How many refills are remaining?" (Note: The label on the medicine or pill bottle will show how many refills are remaining. If there are no refills remaining, then a renewal may be needed.)     Robaxin  in the mar should have 1 more refill, per pt her bottle says 0 refills  Protocols used: Medication Refill and Renewal Call-A-AH

## 2023-06-13 NOTE — Telephone Encounter (Signed)
 1st attempt, no answer, left message to call back.   Copied from CRM [id number]. Topic: Clinical Medication Question >> Jun 13, 2023  2:29 PM [name] wrote: HYDROcodone -acetaminophen  (NORCO/VICODIN) 5-325 MG tablet Patient doesn't know which medications she needs refilled please call her to get clarity  [name], [place] - [address] [place] [address] Phone: [phone] Fax: [phone] Hours: Not open 24 hours

## 2023-06-22 ENCOUNTER — Other Ambulatory Visit: Payer: Self-pay

## 2023-06-22 ENCOUNTER — Other Ambulatory Visit: Payer: Self-pay | Admitting: Family Medicine

## 2023-06-22 DIAGNOSIS — I739 Peripheral vascular disease, unspecified: Secondary | ICD-10-CM

## 2023-06-22 DIAGNOSIS — M79604 Pain in right leg: Secondary | ICD-10-CM

## 2023-06-22 NOTE — Telephone Encounter (Signed)
 Prescription Request  06/22/2023  LOV: 04/13/23  What is the name of the medication or equipment? FARXIGA  10 MG TABS tablet [[id number]]   Have you contacted your pharmacy to request a refill? Yes   Which pharmacy would you like this sent to?  [name], [place] - [address]  [place] [address] Phone: [phone] Fax: [phone]    Patient notified that their request is being sent to the clinical staff for review and that they should receive a response within 2 business days.   Please advise at [facility] [phone]

## 2023-06-22 NOTE — Telephone Encounter (Signed)
 Requested medication (s) are due for refill today: Yes  Requested medication (s) are on the active medication list: Yes  Last refill:  06/13/23  Future visit scheduled: Yes  Notes to clinic:  Unable to refill per protocol, cannot delegate. Will need to be resent to the pharmacy, initially sent as PRINT, not Normal.      Requested Prescriptions  Pending Prescriptions Disp Refills   methocarbamol  (ROBAXIN ) 500 MG tablet 120 tablet 1    Sig: TAKE 1 TABLET BY MOUTH EVERY 6 HOURS AS NEEDED FOR MUSCLE SPASM     Not Delegated - Analgesics:  Muscle Relaxants Failed - 06/22/2023 10:38 AM      Failed - This refill cannot be delegated      Passed - Valid encounter within last 6 months    Recent Outpatient Visits           2 months ago Type 2 diabetes mellitus with other circulatory complication, without long-term current use of insulin  [facility])   [facility] Pickard, Cisco Crest, MD   2 months ago Acute cough   [facility] Cheril Cork, Cisco Crest, MD   7 months ago Visit for suture removal   [place] [facility] Austine Lefort, MD   8 months ago Type 2 diabetes mellitus with diabetic neuropathy, unspecified whether long term insulin  use [facility])   [place] [facility] Austine Lefort, MD   1 year ago Colon cancer screening    [facility] Pickard, Cisco Crest, MD       Future Appointments             In 1 month Alvenia Aus, Tia Flowers, MD [facility] at [facility] A Dept of The [place] [facility], H&V

## 2023-06-22 NOTE — Telephone Encounter (Signed)
 Prescription Request  06/22/2023  LOV: 04/13/2023  What is the name of the medication or equipment? methocarbamol  (ROBAXIN ) 500 MG tablet   Have you contacted your pharmacy to request a refill? Yes   Which pharmacy would you like this sent to?  [name], [place] - [address] [facility] [address] [facility] [address] [place] [address] Phone: [phone] Fax: [phone]    Patient notified that their request is being sent to the clinical staff for review and that they should receive a response within 2 business days.   Please advise at [facility] [phone]

## 2023-06-26 MED ORDER — DAPAGLIFLOZIN PROPANEDIOL 10 MG PO TABS: 10.0000 mg | ORAL_TABLET | Freq: Every day | ORAL | 0 refills | Status: DC

## 2023-06-26 NOTE — Telephone Encounter (Signed)
 Requested Prescriptions  Pending Prescriptions Disp Refills   dapagliflozin  propanediol (FARXIGA ) 10 MG TABS tablet 90 tablet 0    Sig: Take 1 tablet (10 mg total) by mouth daily.     Endocrinology:  Diabetes - SGLT2 Inhibitors Failed - 06/26/2023  9:39 AM      Failed - Cr in normal range and within 360 days    Creat  Date Value Ref Range Status  04/07/2023 1.34 (H) 0.50 - 1.05 mg/dL Final   Creatinine, Urine  Date Value Ref Range Status  06/03/2022 33 20 - 275 mg/dL Final         Failed - HBA1C is between 0 and 7.9 and within 180 days    Hgb A1c MFr Bld  Date Value Ref Range Status  04/07/2023 8.3 (H) <5.7 % of total Hgb Final    Comment:    For someone without known diabetes, a hemoglobin A1c value of 6.5% or greater indicates that they may have  diabetes and this should be confirmed with a follow-up  test. . For someone with known diabetes, a value <7% indicates  that their diabetes is well controlled and a value  greater than or equal to 7% indicates suboptimal  control. A1c targets should be individualized based on  duration of diabetes, age, comorbid conditions, and  other considerations. . Currently, no consensus exists regarding use of hemoglobin A1c for diagnosis of diabetes for children. .          Failed - eGFR in normal range and within 360 days    GFR, Est African American  Date Value Ref Range Status  04/03/2020 69 > OR = 60 mL/min/1.26m2 Final   GFR, Est Non African American  Date Value Ref Range Status  04/03/2020 59 (L) > OR = 60 mL/min/1.36m2 Final   GFR, Estimated  Date Value Ref Range Status  06/30/2021 58 (L) >60 mL/min Final    Comment:    (NOTE) Calculated using the CKD-EPI Creatinine Equation (2021)    GFR  Date Value Ref Range Status  12/07/2017 59.28 (L) >60.00 mL/min Final   eGFR  Date Value Ref Range Status  04/07/2023 44 (L) > OR = 60 mL/min/1.3m2 Final  08/30/2022 62 >59 mL/min/1.73 Final         Passed - Valid encounter  within last 6 months    Recent Outpatient Visits           2 months ago Type 2 diabetes mellitus with other circulatory complication, without long-term current use of insulin  [facility])   [place] [facility] Pickard, Cisco Crest, MD   2 months ago Acute cough   [place] [facility] Cheril Cork, Cisco Crest, MD   7 months ago Visit for suture removal   [place] [facility] Family Medicine Austine Lefort, MD   8 months ago Type 2 diabetes mellitus with diabetic neuropathy, unspecified whether long term insulin  use [facility])   [place] [facility] Austine Lefort, MD   1 year ago Colon cancer screening   [place] [facility] Pickard, Cisco Crest, MD       Future Appointments             In 1 month Alvenia Aus, Tia Flowers, MD [facility] at [facility] A Dept of The [place] [facility]ities, H&V

## 2023-06-27 ENCOUNTER — Other Ambulatory Visit: Payer: Self-pay | Admitting: Family Medicine

## 2023-06-27 DIAGNOSIS — M79604 Pain in right leg: Secondary | ICD-10-CM

## 2023-06-27 DIAGNOSIS — I739 Peripheral vascular disease, unspecified: Secondary | ICD-10-CM

## 2023-06-27 NOTE — Telephone Encounter (Signed)
 Prescription Request  06/27/2023  LOV: 04/13/2023  What is the name of the medication or equipment? methocarbamol  (ROBAXIN ) 500 MG tablet   Have you contacted your pharmacy to request a refill? Yes   Which pharmacy would you like this sent to?  [name], [place] - [address] [address] [place] [address] Phone: [phone] Fax: [phone]    Patient notified that their request is being sent to the clinical staff for review and that they should receive a response within 2 business days.   Please advise at [facility] [phone]

## 2023-06-28 NOTE — Telephone Encounter (Signed)
 Requested medication (s) are due for refill today -no  Requested medication (s) are on the active medication list -yes  Future visit scheduled -yes  Last refill: 06/13/23 #120 1RF  Notes to clinic: non delegated Rx- listed as print  Requested Prescriptions  Pending Prescriptions Disp Refills   methocarbamol  (ROBAXIN ) 500 MG tablet 120 tablet 1    Sig: TAKE 1 TABLET BY MOUTH EVERY 6 HOURS AS NEEDED FOR MUSCLE SPASM     Not Delegated - Analgesics:  Muscle Relaxants Failed - 06/28/2023 10:27 AM      Failed - This refill cannot be delegated      Passed - Valid encounter within last 6 months    Recent Outpatient Visits           2 months ago Type 2 diabetes mellitus with other circulatory complication, without long-term current use of insulin  [facility])   [place] [facility] Pickard, Cisco Crest, MD   3 months ago Acute cough   [place] [facility] Cheril Cork, Cisco Crest, MD   7 months ago Visit for suture removal   [place] [facility] Austine Lefort, MD   8 months ago Type 2 diabetes mellitus with diabetic neuropathy, unspecified whether long term insulin  use [facility])   [place] [facility] Family Medicine Pickard, Cisco Crest, MD   1 year ago Colon cancer screening   [place] [facility] Medicine Pickard, Cisco Crest, MD       Future Appointments             In 1 month Alvenia Aus, Tia Flowers, MD [facility] at [facility] A Dept of The [place] [facility] Utilities, H&V               Requested Prescriptions  Pending Prescriptions Disp Refills   methocarbamol  (ROBAXIN ) 500 MG tablet 120 tablet 1    Sig: TAKE 1 TABLET BY MOUTH EVERY 6 HOURS AS NEEDED FOR MUSCLE SPASM     Not Delegated - Analgesics:  Muscle Relaxants Failed - 06/28/2023 10:27 AM      Failed - This refill cannot be delegated      Passed - Valid encounter within last 6 months    Recent Outpatient Visits           2 months ago Type 2 diabetes mellitus with  other circulatory complication, without long-term current use of insulin  [facility])   [place] [facility] Pickard, Cisco Crest, MD   3 months ago Acute cough   [place] [facility] Family Medicine Cheril Cork, Cisco Crest, MD   7 months ago Visit for suture removal   [place] [facility] Medicine Austine Lefort, MD   8 months ago Type 2 diabetes mellitus with diabetic neuropathy, unspecified whether long term insulin  use [facility])   [place] [facility] Medicine Austine Lefort, MD   1 year ago Colon cancer screening   [place] [facility] Family Medicine Pickard, Cisco Crest, MD       Future Appointments             In 1 month Alvenia Aus, Tia Flowers, MD [facility] HeartCare at [facility] A Dept of The [place] [facility]ities, H&V

## 2023-06-29 MED ORDER — METHOCARBAMOL 500 MG PO TABS: ORAL_TABLET | ORAL | 1 refills | Status: DC

## 2023-07-03 NOTE — Telephone Encounter (Signed)
 The patient called in checking on the status of her refill request and after speaking with Nanette she said she will bring this to the attention of Laraine Plate hoping to get Dr Ashok Blake to sign off of it before he leaves for the day. If not it will be done tomorrow and I let the patient know that as well.

## 2023-07-06 ENCOUNTER — Telehealth: Payer: Self-pay | Admitting: Family Medicine

## 2023-07-06 ENCOUNTER — Other Ambulatory Visit: Payer: Self-pay | Admitting: Family Medicine

## 2023-07-06 DIAGNOSIS — M79604 Pain in right leg: Secondary | ICD-10-CM

## 2023-07-06 DIAGNOSIS — I739 Peripheral vascular disease, unspecified: Secondary | ICD-10-CM

## 2023-07-06 NOTE — Telephone Encounter (Unsigned)
 Copied from CRM [id number]. Topic: Clinical - Medication Refill >> Jul 06, 2023  8:09 AM Baldomero Bone wrote: Medication: methocarbamol  (ROBAXIN ) 500 MG tablet  Has the patient contacted their pharmacy? Yes (Agent: If no, request that the patient contact the pharmacy for the refill. If patient does not wish to contact the pharmacy document the reason why and proceed with request.) (Agent: If yes, when and what did the pharmacy advise?)  This is the patient's preferred pharmacy:  [name], [place] - [address] [place] [address] Phone: [phone] Fax: [phone]  Is this the correct pharmacy for this prescription? Yes If no, delete pharmacy and type the correct one.   Has the prescription been filled recently? No  Is the patient out of the medication? Yes  Has the patient been seen for an appointment in the last year OR does the patient have an upcoming appointment? Yes  Can we respond through MyChart? No  Agent: Please be advised that Rx refills may take up to 3 business days. We ask that you follow-up with your pharmacy.

## 2023-07-06 NOTE — Telephone Encounter (Signed)
 Copied from CRM [id number]. Topic: Clinical - Medication Refill >> Jul 06, 2023  8:11 AM Baldomero Bone wrote: Medication: methocarbamol  (ROBAXIN ) 500 MG tablet  Has the patient contacted their pharmacy? Yes (Agent: If no, request that the patient contact the pharmacy for the refill. If patient does not wish to contact the pharmacy document the reason why and proceed with request.) (Agent: If yes, when and what did the pharmacy advise?)They have not received the refill request.  This is the patient's preferred pharmacy:  [name], [place] - [address] [place] [address] Phone: [phone] Fax: [phone]  Is this the correct pharmacy for this prescription? Yes If no, delete pharmacy and type the correct one.   Has the prescription been filled recently? No  Is the patient out of the medication? Yes  Has the patient been seen for an appointment in the last year OR does the patient have an upcoming appointment? Yes  Can we respond through MyChart? No  Agent: Please be advised that Rx refills may take up to 3 business days. We ask that you follow-up with your pharmacy.

## 2023-07-07 ENCOUNTER — Other Ambulatory Visit: Payer: Self-pay | Admitting: Family Medicine

## 2023-07-07 ENCOUNTER — Other Ambulatory Visit

## 2023-07-07 DIAGNOSIS — E1159 Type 2 diabetes mellitus with other circulatory complications: Secondary | ICD-10-CM

## 2023-07-07 DIAGNOSIS — K861 Other chronic pancreatitis: Secondary | ICD-10-CM | POA: Diagnosis not present

## 2023-07-07 DIAGNOSIS — I10 Essential (primary) hypertension: Secondary | ICD-10-CM

## 2023-07-07 DIAGNOSIS — E785 Hyperlipidemia, unspecified: Secondary | ICD-10-CM

## 2023-07-07 DIAGNOSIS — I25118 Atherosclerotic heart disease of native coronary artery with other forms of angina pectoris: Secondary | ICD-10-CM | POA: Diagnosis not present

## 2023-07-07 DIAGNOSIS — I739 Peripheral vascular disease, unspecified: Secondary | ICD-10-CM | POA: Diagnosis not present

## 2023-07-07 NOTE — Telephone Encounter (Signed)
 Requested medication (s) are due for refill today: yes  Requested medication (s) are on the active medication list: yes  Last refill:  06/29/23 #120 class: Print  Future visit scheduled: yes  Notes to clinic:  [facility]. Last received was Jan.    Requested Prescriptions  Pending Prescriptions Disp Refills   methocarbamol  (ROBAXIN ) 500 MG tablet 120 tablet 1    Sig: TAKE 1 TABLET BY MOUTH EVERY 6 HOURS AS NEEDED FOR MUSCLE SPASM     Not Delegated - Analgesics:  Muscle Relaxants Failed - 07/07/2023 11:21 AM      Failed - This refill cannot be delegated      Passed - Valid encounter within last 6 months    Recent Outpatient Visits           2 months ago Type 2 diabetes mellitus with other circulatory complication, without long-term current use of insulin  [facility])   [facility] Pickard, Cisco Crest, MD   3 months ago Acute cough   [place] [facility] Cheril Cork, Cisco Crest, MD   7 months ago Visit for suture removal   [place] [facility] Medicine Austine Lefort, MD   8 months ago Type 2 diabetes mellitus with diabetic neuropathy, unspecified whether long term insulin  use [facility])   [place] [facility] Family Medicine Austine Lefort, MD   1 year ago Colon cancer screening   [place] [facility] Pickard, Cisco Crest, MD       Future Appointments             In 1 month Alvenia Aus, Tia Flowers, MD [facility] at [facility] A Dept of The [place] [facility] Utilities, H&V

## 2023-07-07 NOTE — Telephone Encounter (Unsigned)
 Copied from CRM [id number]. Topic: Clinical - Medication Refill >> Jul 07, 2023  2:22 PM Sophia H wrote: Medication: HYDROcodone -acetaminophen  (NORCO/VICODIN) 5-325 MG tablet  Has the patient contacted their pharmacy? No (Agent: If no, request that the patient contact the pharmacy for the refill. If patient does not wish to contact the pharmacy document the reason why and proceed with request.) (Agent: If yes, when and what did the pharmacy advise?)  This is the patient's preferred pharmacy:  [name], [place] - [address] [place] [address] Phone: [phone] Fax: [phone]  Is this the correct pharmacy for this prescription? Yes If no, delete pharmacy and type the correct one.   Has the prescription been filled recently? Yes  Is the patient out of the medication? No but provider will be out when rx due for a refill   Has the patient been seen for an appointment in the last year OR does the patient have an upcoming appointment? Yes  Can we respond through MyChart? Yes  Agent: Please be advised that Rx refills may take up to 3 business days. We ask that you follow-up with your pharmacy.

## 2023-07-08 LAB — COMPLETE METABOLIC PANEL WITHOUT GFR
AG Ratio: 1.8 (calc) (ref 1.0–2.5)
ALT: 16 U/L (ref 6–29)
AST: 17 U/L (ref 10–35)
Albumin: 4.2 g/dL (ref 3.6–5.1)
Alkaline phosphatase (APISO): 69 U/L (ref 37–153)
BUN: 25 mg/dL (ref 7–25)
CO2: 25 mmol/L (ref 20–32)
Calcium: 10.1 mg/dL (ref 8.6–10.4)
Chloride: 106 mmol/L (ref 98–110)
Creat: 0.99 mg/dL (ref 0.50–1.05)
Globulin: 2.3 g/dL (ref 1.9–3.7)
Glucose, Bld: 92 mg/dL (ref 65–99)
Potassium: 4.7 mmol/L (ref 3.5–5.3)
Sodium: 140 mmol/L (ref 135–146)
Total Bilirubin: 0.3 mg/dL (ref 0.2–1.2)
Total Protein: 6.5 g/dL (ref 6.1–8.1)

## 2023-07-08 LAB — CBC WITH DIFFERENTIAL/PLATELET
Absolute Lymphocytes: 1573 {cells}/uL (ref 850–3900)
Absolute Monocytes: 479 {cells}/uL (ref 200–950)
Basophils Absolute: 63 {cells}/uL (ref 0–200)
Basophils Relative: 1.1 %
Eosinophils Absolute: 308 {cells}/uL (ref 15–500)
Eosinophils Relative: 5.4 %
HCT: 37 % (ref 35.0–45.0)
Hemoglobin: 11.4 g/dL — ABNORMAL LOW (ref 11.7–15.5)
MCH: 26.6 pg — ABNORMAL LOW (ref 27.0–33.0)
MCHC: 30.8 g/dL — ABNORMAL LOW (ref 32.0–36.0)
MCV: 86.4 fL (ref 80.0–100.0)
MPV: 10.1 fL (ref 7.5–12.5)
Monocytes Relative: 8.4 %
Neutro Abs: 3278 {cells}/uL (ref 1500–7800)
Neutrophils Relative %: 57.5 %
Platelets: 248 10*3/uL (ref 140–400)
RBC: 4.28 10*6/uL (ref 3.80–5.10)
RDW: 14.5 % (ref 11.0–15.0)
Total Lymphocyte: 27.6 %
WBC: 5.7 10*3/uL (ref 3.8–10.8)

## 2023-07-08 LAB — MICROALBUMIN / CREATININE URINE RATIO
Creatinine, Urine: 54 mg/dL (ref 20–275)
Microalb Creat Ratio: 135 mg/g{creat} — ABNORMAL HIGH (ref <30)
Microalb, Ur: 7.3 mg/dL

## 2023-07-08 LAB — HEMOGLOBIN A1C
Hgb A1c MFr Bld: 7.3 % — ABNORMAL HIGH (ref <5.7)
Mean Plasma Glucose: 163 mg/dL
eAG (mmol/L): 9 mmol/L

## 2023-07-08 LAB — LIPID PANEL
Cholesterol: 141 mg/dL (ref <200)
HDL: 48 mg/dL — ABNORMAL LOW (ref >=50)
LDL Cholesterol (Calc): 65 mg/dL
Non-HDL Cholesterol (Calc): 93 mg/dL (ref <130)
Total CHOL/HDL Ratio: 2.9 (calc) (ref <5.0)
Triglycerides: 215 mg/dL — ABNORMAL HIGH (ref <150)

## 2023-07-10 ENCOUNTER — Ambulatory Visit: Payer: Self-pay | Admitting: Family Medicine

## 2023-07-11 ENCOUNTER — Ambulatory Visit: Payer: HMO | Admitting: Family Medicine

## 2023-07-11 NOTE — Telephone Encounter (Signed)
 Requested medication (s) are due for refill today: yes  Requested medication (s) are on the active medication list: yes  Last refill:  06/13/23  Future visit scheduled: yes  Notes to clinic:  Unable to refill per protocol, cannot delegate.      Requested Prescriptions  Pending Prescriptions Disp Refills   HYDROcodone -acetaminophen  (NORCO/VICODIN) 5-325 MG tablet 130 tablet 0    Sig: Take 1 tablet by mouth every 6 (six) hours as needed for moderate pain (pain score 4-6). Patient needs to schedule office visit     Not Delegated - Analgesics:  Opioid Agonist Combinations Failed - 07/11/2023  2:55 PM      Failed - This refill cannot be delegated      Failed - Urine Drug Screen completed in last 360 days      Failed - Valid encounter within last 3 months    Recent Outpatient Visits           2 months ago Type 2 diabetes mellitus with other circulatory complication, without long-term current use of insulin  [facility])   [place] [facility] Medicine Pickard, Cisco Crest, MD   3 months ago Acute cough   [place] [facility] Cheril Cork, Cisco Crest, MD   8 months ago Visit for suture removal   [place] [facility] Medicine Austine Lefort, MD   8 months ago Type 2 diabetes mellitus with diabetic neuropathy, unspecified whether long term insulin  use [facility])   [place] [facility] Family Medicine Pickard, Cisco Crest, MD   1 year ago Colon cancer screening   [place] [facility] Medicine Pickard, Cisco Crest, MD       Future Appointments             In 4 weeks Wenona Hamilton, MD [facility] HeartCare at [facility] A Dept of The [place] [facility] Utilities, H&V

## 2023-07-12 ENCOUNTER — Telehealth: Payer: Self-pay

## 2023-07-12 NOTE — Telephone Encounter (Signed)
 Copied from CRM [id number]. Topic: Clinical - Lab/Test Results >> Jul 12, 2023  3:06 PM Juluis Ok wrote: Reason for CRM: Att: "Lang Pipes" Patient requesting lab results. I relayed provider's note that results will be discussed during office visit. Patient states she would like to know before appointment and request a callback. Patient also has questions regarding her medication refills.

## 2023-07-13 ENCOUNTER — Telehealth: Payer: Self-pay | Admitting: Family Medicine

## 2023-07-13 MED ORDER — HYDROCODONE-ACETAMINOPHEN 5-325 MG PO TABS: 1.0000 | ORAL_TABLET | Freq: Four times a day (QID) | ORAL | 0 refills | Status: DC | PRN

## 2023-07-13 NOTE — Telephone Encounter (Signed)
 Prescription Request  07/13/2023  LOV: 04/13/2023  What is the name of the medication or equipment?   ONE TOUCH ULTRA BLUE TEST STRP (LAST FILLED 06/14/2023)  Have you contacted your pharmacy to request a refill? Yes   Which pharmacy would you like this sent to?  [name], [place] - [address] [facility] [address] [facility] [place] [address] Phone: [phone] Fax: [phone]    Patient notified that their request is being sent to the clinical staff for review and that they should receive a response within 2 business days.   Please advise pharmacist.

## 2023-07-15 MED ORDER — ONETOUCH ULTRA TEST VI STRP: ORAL_STRIP | 1 refills | Status: DC

## 2023-07-15 NOTE — Telephone Encounter (Signed)
 Requested Prescriptions  Pending Prescriptions Disp Refills   ONETOUCH ULTRA TEST test strip 100 each 1    Sig: Use as instructed     There is no refill protocol information for this order

## 2023-07-17 ENCOUNTER — Ambulatory Visit (INDEPENDENT_AMBULATORY_CARE_PROVIDER_SITE_OTHER): Admitting: Family Medicine

## 2023-07-17 VITALS — BP 132/72 | HR 84 | Temp 97.6°F | Ht 62.0 in | Wt 195.2 lb

## 2023-07-17 DIAGNOSIS — M79604 Pain in right leg: Secondary | ICD-10-CM

## 2023-07-17 DIAGNOSIS — E1159 Type 2 diabetes mellitus with other circulatory complications: Secondary | ICD-10-CM

## 2023-07-17 DIAGNOSIS — M25512 Pain in left shoulder: Secondary | ICD-10-CM | POA: Diagnosis not present

## 2023-07-17 DIAGNOSIS — Z7984 Long term (current) use of oral hypoglycemic drugs: Secondary | ICD-10-CM

## 2023-07-17 DIAGNOSIS — M25511 Pain in right shoulder: Secondary | ICD-10-CM

## 2023-07-17 DIAGNOSIS — G8929 Other chronic pain: Secondary | ICD-10-CM | POA: Diagnosis not present

## 2023-07-17 DIAGNOSIS — M79605 Pain in left leg: Secondary | ICD-10-CM | POA: Diagnosis not present

## 2023-07-17 DIAGNOSIS — R1011 Right upper quadrant pain: Secondary | ICD-10-CM | POA: Diagnosis not present

## 2023-07-17 MED ORDER — PREGABALIN 50 MG PO CAPS: 50.0000 mg | ORAL_CAPSULE | Freq: Three times a day (TID) | ORAL | 2 refills | Status: DC

## 2023-07-17 MED ORDER — METHOCARBAMOL 500 MG PO TABS: ORAL_TABLET | ORAL | 3 refills | Status: DC

## 2023-07-17 NOTE — Progress Notes (Signed)
 Subjective:    Patient ID: [name], female    DOB: 03-30-1955, 68 y.o.   MRN: [id number]  Patient is a 68 year old female with a history of coronary artery disease as well as type 2 diabetes mellitus and chronic pancreatic insufficiency.  She has improved her hemoglobin A1c substantially.  In February it was 8.3.  She has dropped it to 7.3.  Her most recent lab work is listed below. No visits with results within 1 Week(s) from this visit.  Latest known visit with results is:  Lab on 07/07/2023  Component Date Value Ref Range Status   WBC 07/07/2023 5.7  3.8 - 10.8 Thousand/uL Final   RBC 07/07/2023 4.28  3.80 - 5.10 Million/uL Final   Hemoglobin 07/07/2023 11.4 (L)  11.7 - 15.5 g/dL Final   HCT 02/72/5366 37.0  35.0 - 45.0 % Final   MCV 07/07/2023 86.4  80.0 - 100.0 fL Final   MCH 07/07/2023 26.6 (L)  27.0 - 33.0 pg Final   MCHC 07/07/2023 30.8 (L)  32.0 - 36.0 g/dL Final   Comment: For adults, a slight decrease in the calculated MCHC value (in the range of 30 to 32 g/dL) is most likely not clinically significant; however, it should be interpreted with caution in correlation with other red cell parameters and the patient's clinical condition.    RDW 07/07/2023 14.5  11.0 - 15.0 % Final   Platelets 07/07/2023 248  140 - 400 Thousand/uL Final   MPV 07/07/2023 10.1  7.5 - 12.5 fL Final   Neutro Abs 07/07/2023 3,278  1,500 - 7,800 cells/uL Final   Absolute Lymphocytes 07/07/2023 1,573  850 - 3,900 cells/uL Final   Absolute Monocytes 07/07/2023 479  200 - 950 cells/uL Final   Eosinophils Absolute 07/07/2023 308  15 - 500 cells/uL Final   Basophils Absolute 07/07/2023 63  0 - 200 cells/uL Final   Neutrophils Relative % 07/07/2023 57.5  % Final   Total Lymphocyte 07/07/2023 27.6  % Final   Monocytes Relative 07/07/2023 8.4  % Final   Eosinophils Relative 07/07/2023 5.4  % Final   Basophils Relative 07/07/2023 1.1  % Final   Glucose, Bld 07/07/2023 92  65 - 99 mg/dL Final    Comment: .            Fasting reference interval .    BUN 07/07/2023 25  7 - 25 mg/dL Final   Creat 44/04/4740 0.99  0.50 - 1.05 mg/dL Final   BUN/Creatinine Ratio 07/07/2023 SEE NOTE:  6 - 22 (calc) Final   Comment:    Not Reported: BUN and Creatinine are within    reference range. .    Sodium 07/07/2023 140  135 - 146 mmol/L Final   Potassium 07/07/2023 4.7  3.5 - 5.3 mmol/L Final   Chloride 07/07/2023 106  98 - 110 mmol/L Final   CO2 07/07/2023 25  20 - 32 mmol/L Final   Calcium  07/07/2023 10.1  8.6 - 10.4 mg/dL Final   Total Protein 59/56/3875 6.5  6.1 - 8.1 g/dL Final   Albumin 64/33/2951 4.2  3.6 - 5.1 g/dL Final   Globulin 88/41/6606 2.3  1.9 - 3.7 g/dL (calc) Final   AG Ratio 07/07/2023 1.8  1.0 - 2.5 (calc) Final   Total Bilirubin 07/07/2023 0.3  0.2 - 1.2 mg/dL Final   Alkaline phosphatase (APISO) 07/07/2023 69  37 - 153 U/L Final   AST 07/07/2023 17  10 - 35 U/L Final   ALT 07/07/2023  16  6 - 29 U/L Final   Hgb A1c MFr Bld 07/07/2023 7.3 (H)  <5.7 % Final   Comment: For someone without known diabetes, a hemoglobin A1c value of 6.5% or greater indicates that they may have  diabetes and this should be confirmed with a follow-up  test. . For someone with known diabetes, a value <7% indicates  that their diabetes is well controlled and a value  greater than or equal to 7% indicates suboptimal  control. A1c targets should be individualized based on  duration of diabetes, age, comorbid conditions, and  other considerations. . Currently, no consensus exists regarding use of hemoglobin A1c for diagnosis of diabetes for children. .    Mean Plasma Glucose 07/07/2023 163  mg/dL Final   eAG (mmol/L) 13/09/6576 9.0  mmol/L Final   Cholesterol 07/07/2023 141  <200 mg/dL Final   HDL 46/96/2952 48 (L)  > OR = 50 mg/dL Final   Triglycerides 84/13/2440 215 (H)  <150 mg/dL Final   Comment: . If a non-fasting specimen was collected, consider repeat triglyceride testing on a  fasting specimen if clinically indicated.  Imagene Mam et al. J. of Clin. Lipidol. 2015;9:129-169. Aaron Aas    LDL Cholesterol (Calc) 07/07/2023 65  mg/dL (calc) Final   Comment: Reference range: <100 . Desirable range <100 mg/dL for primary prevention;   <70 mg/dL for patients with CHD or diabetic patients  with > or = 2 CHD risk factors. Aaron Aas LDL-C is now calculated using the Martin-Hopkins  calculation, which is a validated novel method providing  better accuracy than the Friedewald equation in the  estimation of LDL-C.  Melinda Sprawls et al. Erroll Heard. 1027;253(66): 2061-2068  (http://education.[employer].com/faq/FAQ164)    Total CHOL/HDL Ratio 07/07/2023 2.9  <4.4 (calc) Final   Non-HDL Cholesterol (Calc) 07/07/2023 93  <130 mg/dL (calc) Final   Comment: For patients with diabetes plus 1 major ASCVD risk  factor, treating to a non-HDL-C goal of <100 mg/dL  (LDL-C of <03 mg/dL) is considered a therapeutic  option.    Creatinine, Urine 07/07/2023 54  20 - 275 mg/dL Final   Microalb, Ur 47/42/5956 7.3  mg/dL Final   Comment: Reference Range Not established    Microalb Creat Ratio 07/07/2023 135 (H)  <30 mg/g creat Final   Comment: . The ADA defines abnormalities in albumin excretion as follows: Aaron Aas Albuminuria Category        Result (mg/g creatinine) . Normal to Mildly increased   <30 Moderately increased         30-299  Severely increased           > OR = 300 . The ADA recommends that at least two of three specimens collected within a 3-6 month period be abnormal before considering a patient to be within a diagnostic category.    However the patient does continue to have microscopic proteinuria related to her diabetes.  Therefore we discussed her options to better manage her blood sugars today.  She also complains of bilateral shoulder pain.  She reports pain with abduction greater than 90 degrees.  She reports pain to the patient complains on external rotation.  It hurts in both  shoulders when she tries to sleep on them at night.  She would like to see an orthopedist.  She cannot tolerate an MRI.  She also complains of right upper quadrant abdominal pain.  She states that off-and-on over the last month, she has been getting sharp intense pain in her right breast.  This  radiates towards her right axilla.  The pain can be intense with no specific trigger.  She denies any shortness of breath.  She denies any angina.  There is no relationship with her pain to exercise or activity Past Medical History:  Diagnosis Date   Aortic atherosclerosis (HCC)    Asthma    CAD (coronary artery disease)    a. LHC 2011: Diffuse distal and branch vessel disease - patient managed medically, no interventional options. b. Nuc 03/2014 - low risk, no ischemia.   Complication of anesthesia    slow to wake up with last surgery in 2011    Diabetes mellitus with circulatory complication (HCC)    Diverticulosis    Dyslipidemia    GERD (gastroesophageal reflux disease)    Headache    hx of migraines    Hypertension    Osteoarthritis    severe right knee  R TKR   PAD (peripheral artery disease) (HCC)    a. Severe stenosis mid right SFA s/p atherectomy 03/25/09. b. peripheral angiography in 12/2011 which showed only about 50% diffuse right SFA stenosis.   Pancreatitis 2003   Renal artery stenosis (HCC)    a. LHC 2011 - 40-50% left RAS.   Tobacco use disorder    quit 11/10   Past Surgical History:  Procedure Laterality Date   ABDOMINAL AORTAGRAM N/A 12/21/2011   Procedure: ABDOMINAL AORTAGRAM;  Surgeon: Wenona Hamilton, MD;  [facility]: [facility];  Service: Cardiovascular;  Laterality: N/A;   ABDOMINAL AORTOGRAM W/LOWER EXTREMITY Bilateral 05/08/2019   Procedure: ABDOMINAL AORTOGRAM W/LOWER EXTREMITY;  Surgeon: Wenona Hamilton, MD;  [facility];  Service: Cardiovascular;  Laterality: Bilateral;   ABDOMINAL AORTOGRAM W/LOWER EXTREMITY N/A 09/28/2022   Procedure: ABDOMINAL  AORTOGRAM W/LOWER EXTREMITY;  Surgeon: Wenona Hamilton, MD;  [facility];  Service: Cardiovascular;  Laterality: N/A;   CARDIAC CATHETERIZATION     ENDARTERECTOMY FEMORAL Right 10/10/2019   Procedure: RIGHT FEMORAL ENDARTERECTOMY;  Surgeon: Young Hensen, MD;  [facility]: [facility] OR;  Service: Vascular;  Laterality: Right;   ESOPHAGOGASTRODUODENOSCOPY (EGD) WITH PROPOFOL  N/A 10/05/2017   Procedure: ESOPHAGOGASTRODUODENOSCOPY (EGD) WITH PROPOFOL ;  Surgeon: Janel Medford, MD;  Location: [facility];  Service: Endoscopy;  Laterality: N/A;   EUS N/A 10/05/2017   Procedure: UPPER ENDOSCOPIC ULTRASOUND (EUS) RADIAL;  Surgeon: Janel Medford, MD;  Location: [facility];  Service: Endoscopy;  Laterality: N/A;   FOOT SURGERY Left    LOWER EXTREMITY ANGIOGRAM Bilateral 05/27/2015   Procedure: Lower Extremity Angiogram;  Surgeon: Wenona Hamilton, MD;  [facility];  Service: Cardiovascular;  Laterality: Bilateral;   PATCH ANGIOPLASTY Right 10/10/2019   Procedure: PATCH ANGIOPLASTY USING Corinna Dickens BIOLOGIC PATCH;  Surgeon: Young Hensen, MD;  [facility] OR;  Service: Vascular;  Laterality: Right;   PERIPHERAL INTRAVASCULAR LITHOTRIPSY  09/28/2022   Procedure: PERIPHERAL INTRAVASCULAR LITHOTRIPSY;  Surgeon: Wenona Hamilton, MD;  [facility];  Service: Cardiovascular;;   PERIPHERAL VASCULAR ATHERECTOMY Left 05/08/2019   Procedure: PERIPHERAL VASCULAR ATHERECTOMY;  Surgeon: Wenona Hamilton, MD;  [facility];  Service: Cardiovascular;  Laterality: Left;   PERIPHERAL VASCULAR CATHETERIZATION N/A 05/27/2015   Procedure: Abdominal Aortogram;  Surgeon: Wenona Hamilton, MD;  [facility];  Service: Cardiovascular;  Laterality: N/A;   PERIPHERAL VASCULAR CATHETERIZATION Right 05/27/2015   Procedure: Peripheral Vascular Intervention;  Surgeon: Wenona Hamilton, MD;  [facility];  Service: Cardiovascular;   Laterality: Right;  SFA   PERIPHERAL VASCULAR INTERVENTION  09/28/2022   Procedure: PERIPHERAL VASCULAR INTERVENTION;  Surgeon: Wenona Hamilton, MD;  [facility];  Service: Cardiovascular;;   TOTAL KNEE ARTHROPLASTY Right 2011   TOTAL KNEE ARTHROPLASTY Left 05/22/2014   Procedure: LEFT TOTAL KNEE ARTHROPLASTY;  Surgeon: Hazle Lites, MD;  Location: [facility];  Service: Orthopedics;  Laterality: Left;   TRIGGER FINGER RELEASE Left 07/07/2021   Procedure: LEFT INDEX FINGER RELEASE TRIGGER FINGER/A-1 PULLEY / CARPOMETACARPAL INJECTION;  Surgeon: Marilyn Shropshire, MD;  Location: [facility];  Service: Orthopedics;  Laterality: Left;   TUBAL LIGATION  1980   Current Outpatient Medications on File Prior to Visit  Medication Sig Dispense Refill   acetaminophen  (TYLENOL ) 500 MG tablet Take 1,000 mg by mouth every 6 (six) hours as needed for moderate pain or headache.     albuterol  (VENTOLIN  HFA) 108 (90 Base) MCG/ACT inhaler Inhale 2 puffs into the lungs every 6 (six) hours as needed for wheezing or shortness of breath. 8.5 each 3   aspirin  EC 81 MG tablet Take 1 tablet (81 mg total) by mouth daily. Swallow whole.     bisoprolol  (ZEBETA ) 5 MG tablet TAKE 1/2 TABLET(2.5 MG) BY MOUTH DAILY 45 tablet 3   Calcium  Polycarbophil (FIBER-CAPS PO) Take 1 capsule by mouth daily.     clopidogrel  (PLAVIX ) 75 MG tablet TAKE 1 TABLET(75 MG) BY MOUTH DAILY 90 tablet 1   COLLAGEN PO Take 1,000 mg by mouth daily.     Cyanocobalamin (VITAMIN B-12) 5000 MCG TBDP Take 5,000 mcg by mouth daily.     dapagliflozin  propanediol (FARXIGA ) 10 MG TABS tablet Take 1 tablet (10 mg total) by mouth daily. 90 tablet 0   diclofenac  sodium (VOLTAREN ) 1 % GEL Apply 2 g topically 4 (four) times daily. (Patient taking differently: Apply 2 g topically 4 (four) times daily as needed (for hand pain).) 100 g 0   doxycycline  (VIBRA -TABS) 100 MG tablet Take 1 tablet (100 mg total) by mouth 2 (two) times daily. 14  tablet 0   fluticasone -salmeterol (ADVAIR  DISKUS) 250-50 MCG/ACT AEPB Inhale 1 puff into the lungs in the morning and at bedtime. 60 each 11   HYDROcodone -acetaminophen  (NORCO/VICODIN) 5-325 MG tablet Take 1 tablet by mouth every 6 (six) hours as needed for moderate pain (pain score 4-6). Patient needs to schedule office visit 130 tablet 0   isosorbide  mononitrate (IMDUR ) 60 MG 24 hr tablet TAKE 1 TABLET BY MOUTH EVERY DAY 90 tablet 1   ketorolac  (ACULAR ) 0.5 % ophthalmic solution Place 1 drop into the left eye 4 (four) times daily.     lipase/protease/amylase (CREON ) 36000 UNITS CPEP capsule Take 3 capsules (108,000 Units total) by mouth 3 (three) times daily with meals. May also take 1 capsule (36,000 Units total) as needed (with snacks). 200 capsule 3   loratadine (CLARITIN) 10 MG tablet Take 10 mg by mouth daily.     metFORMIN  (GLUCOPHAGE ) 1000 MG tablet Take 1 tablet (1,000 mg total) by mouth 2 (two) times daily with a meal. 180 tablet 0   metFORMIN  (GLUCOPHAGE ) 1000 MG tablet Take 1 tablet (1,000 mg total) by mouth 2 (two) times daily with a meal. 180 tablet 2   methocarbamol  (ROBAXIN ) 500 MG tablet TAKE 1 TABLET BY MOUTH EVERY 6 HOURS AS NEEDED FOR MUSCLE SPASM 120 tablet 1   Multiple Vitamin (MULTIVITAMIN) tablet Take 1 tablet by mouth daily.     nitroGLYCERIN  (  NITROSTAT ) 0.4 MG SL tablet Place 1 tablet under the tongue every 5 (five) minutes as needed for chest pain.     ofloxacin (OCUFLOX) 0.3 % ophthalmic solution Place 1 drop into the left eye 4 (four) times daily.     ONETOUCH ULTRA TEST test strip Use as instructed 100 each 1   pantoprazole  (PROTONIX ) 40 MG tablet Take 1 tablet (40 mg total) by mouth daily. 90 tablet 1   pioglitazone  (ACTOS ) 30 MG tablet TAKE 1 TABLET BY MOUTH EVERY DAY 90 tablet 1   prednisoLONE acetate (PRED FORTE) 1 % ophthalmic suspension Place 1 drop into the left eye 4 (four) times daily.     pregabalin  (LYRICA ) 50 MG capsule Take 1 capsule (50 mg total) by mouth  3 (three) times daily. 90 capsule 2   Probiotic Product (TRUBIOTICS PO) Take 1 capsule by mouth daily.     promethazine  (PHENERGAN ) 25 MG tablet TAKE 1 TABLET(25 MG) BY MOUTH EVERY 8 HOURS AS NEEDED FOR NAUSEA OR VOMITING 30 tablet 2   promethazine -dextromethorphan (PROMETHAZINE -DM) 6.25-15 MG/5ML syrup Take 5 mLs by mouth 4 (four) times daily as needed for cough. 118 mL 0   rosuvastatin  (CRESTOR ) 40 MG tablet TAKE 1 TABLET(40 MG) BY MOUTH EVERY NIGHT 90 tablet 1   sitaGLIPtin  (JANUVIA ) 100 MG tablet TAKE 1 TABLET BY MOUTH EVERY DAY 90 tablet 0   No current facility-administered medications on file prior to visit.   Allergies  Allergen Reactions   Gabapentin  Other (See Comments)    Brain shakes   Glucotrol [Glipizide] Rash and Other (See Comments)    Red rash   Influenza Vaccines Other (See Comments)    Significant arm soreness requiring 1 year of physical therapy   Latex Rash   Nortriptyline  Other (See Comments)    BRAIN SHAKE   Tetanus Toxoid Hives, Swelling, Rash and Other (See Comments)    Swelling to site of injection with rash and fever   Other Itching, Other (See Comments) and Cough    Patient is highly allergic to Cats and she begins to break out in rash and turns red if someone caring for her has cats at home.  Patient takes Claritin for this type of reaction.  Patient stated that if it goes untreated then he begins to have difficulty breathing   Prednisone  & Diphenhydramine     Pt get really headaches   Invokana  [Canagliflozin ] Other (See Comments)    Yeast Infections   Social History   Socioeconomic History   Marital status: Married    Spouse name: Not on file   Number of children: Not on file   Years of education: 10   Highest education level: Not on file  Occupational History   Not on file  Tobacco Use   Smoking status: Former    Current packs/day: 0.00    Types: Cigarettes    Quit date: 12/13/2008    Years since quitting: 14.6   Smokeless tobacco: Never   Vaping Use   Vaping status: Never Used  Substance and Sexual Activity   Alcohol use: No    Comment: Quit drinking around year 2000   Drug use: No    Comment: Patient quit smoking around 2011   Sexual activity: Yes    Partners: Male    Birth control/protection: Post-menopausal  Other Topics Concern   Not on file  Social History Narrative   Denies any IV drug use or marijuana use.  Former smoker for 30 years, quit Nov. 2010.  She is married with 2 children. Disabled from knee, not working.   Social Drivers of Health   Financial Resource Strain: Medium Risk (09/28/2018)   Overall Financial Resource Strain (CARDIA)    Difficulty of Paying Living Expenses: Somewhat hard  Food Insecurity: Not on file  Transportation Needs: No Transportation Needs (09/28/2018)   PRAPARE - Administrator, Civil Service (Medical): No    Lack of Transportation (Non-Medical): No  Physical Activity: Not on file  Stress: Not on file  Social Connections: Unknown (09/28/2018)   Social Connection and Isolation Panel [NHANES]    Frequency of Communication with Friends and Family: Once a week    Frequency of Social Gatherings with Friends and Family: Once a week    Attends Religious Services: Not on Marketing executive or Organizations: Not on file    Attends Banker Meetings: Not on file    Marital Status: Married  Intimate Partner Violence: Not At Risk (09/28/2018)   Humiliation, Afraid, Rape, and Kick questionnaire    Fear of Current or Ex-Partner: No    Emotionally Abused: No    Physically Abused: No    Sexually Abused: No   Family History  Problem Relation Age of Onset   CVA Mother    Emphysema Father    Breast cancer Sister 54   Colon cancer Neg Hx      Review of Systems  All other systems reviewed and are negative.      Objective:   Physical Exam Vitals reviewed.  Constitutional:      General: She is not in acute distress.    Appearance: She is  well-developed. She is not diaphoretic.  Neck:     Vascular: No JVD.  Cardiovascular:     Rate and Rhythm: Normal rate and regular rhythm.     Heart sounds: Normal heart sounds. No murmur heard. Pulmonary:     Effort: Pulmonary effort is normal.  Chest:     Chest wall: No crepitus.         Assessment & Plan:  Type 2 diabetes mellitus with other circulatory complication, without long-term current use of insulin  (HCC) - Plan: Hemoglobin A1c, CBC with Differential/Platelet, Comprehensive metabolic panel with GFR, Lipid panel, Microalbumin/Creatinine Ratio, Urine  Bilateral leg pain - Plan: methocarbamol  (ROBAXIN ) 500 MG tablet, pregabalin  (LYRICA ) 50 MG capsule  Chronic pain of both shoulders - Plan: Ambulatory referral to Orthopedic Surgery  RUQ pain - Plan: US  Abdomen Limited RUQ (LIVER/GB) I refilled the Lyrica  and methocarbamol  that the patient takes for her body pain.  I am proud of the patient for improving her hemoglobin A1c.  However I would like to see it less than 6.5.  We discussed switching Januvia  to Rybelsus versus Mounjaro.  The patient would like to try the Rybelsus.  I cautioned the patient about nausea and pancreatitis.  If she develops either one of the symptoms will stop the medication and go back to Januvia .  I gave her samples of 3 mg.  She will notify me in 1 month how she is tolerating it.  She does not want to do insulin .  She has an allergy to glipizide.  I believe the pain in her shoulders is likely rotator cuff tendinitis versus tear.  X-ray only showed arthritis in the past.  Patient declines a cortisone injection today but does request to make a referral to orthopedic surgery.  Perhaps they can use an ultrasound to determine if  there is a tear in the tendon of the rotator cuff since she cannot tolerate an MRI.  I believe the pain in her right chest is likely gallstones.  Proceed with an ultrasound of the gallbladder.  If negative, consider imaging of the chest.  Pain  does not sound cardiac in nature

## 2023-07-19 ENCOUNTER — Other Ambulatory Visit: Payer: Self-pay

## 2023-07-19 ENCOUNTER — Ambulatory Visit
Admission: RE | Admit: 2023-07-19 | Discharge: 2023-07-19 | Disposition: A | Discharge status: Home / Self Care | Source: Ambulatory Visit | Attending: Family Medicine | Admitting: Family Medicine

## 2023-07-19 DIAGNOSIS — E1159 Type 2 diabetes mellitus with other circulatory complications: Secondary | ICD-10-CM

## 2023-07-19 DIAGNOSIS — R1011 Right upper quadrant pain: Secondary | ICD-10-CM | POA: Diagnosis not present

## 2023-07-19 MED ORDER — ONETOUCH ULTRA TEST VI STRP: ORAL_STRIP | 1 refills | Status: DC

## 2023-07-20 ENCOUNTER — Ambulatory Visit: Payer: Self-pay | Admitting: Family Medicine

## 2023-07-20 ENCOUNTER — Telehealth: Payer: Self-pay | Admitting: Family Medicine

## 2023-07-20 ENCOUNTER — Other Ambulatory Visit: Payer: Self-pay

## 2023-07-20 ENCOUNTER — Other Ambulatory Visit: Payer: Self-pay | Admitting: Family Medicine

## 2023-07-20 DIAGNOSIS — I701 Atherosclerosis of renal artery: Secondary | ICD-10-CM

## 2023-07-20 DIAGNOSIS — I10 Essential (primary) hypertension: Secondary | ICD-10-CM

## 2023-07-20 DIAGNOSIS — E1159 Type 2 diabetes mellitus with other circulatory complications: Secondary | ICD-10-CM

## 2023-07-20 DIAGNOSIS — K219 Gastro-esophageal reflux disease without esophagitis: Secondary | ICD-10-CM

## 2023-07-20 MED ORDER — PANTOPRAZOLE SODIUM 40 MG PO TBEC: 40.0000 mg | DELAYED_RELEASE_TABLET | Freq: Every day | ORAL | 1 refills | Status: DC

## 2023-07-20 MED ORDER — ISOSORBIDE MONONITRATE ER 60 MG PO TB24: ORAL_TABLET | ORAL | 1 refills | Status: DC

## 2023-07-20 NOTE — Telephone Encounter (Signed)
 Prescription Request  07/20/2023  LOV: 07/17/2023  What is the name of the medication or equipment? isosorbide  mononitrate (IMDUR ) 60 MG 24 hr tablet   Have you contacted your pharmacy to request a refill? Yes   Which pharmacy would you like this sent to?  [id number] [name], [place] - [address] [place] [address] Phone: [phone] Fax: [phone]    Patient notified that their request is being sent to the clinical staff for review and that they should receive a response within 2 business days.   Please advise at [facility] [phone]

## 2023-07-20 NOTE — Telephone Encounter (Signed)
 Prescription Request  07/20/2023  LOV: 07/17/2023  What is the name of the medication or equipment? pantoprazole  (PROTONIX ) 40 MG tablet   Have you contacted your pharmacy to request a refill? Yes   Which pharmacy would you like this sent to?  [id number] [name], [place] - [address] [place] [address] Phone: [phone] Fax: [phone]    Patient notified that their request is being sent to the clinical staff for review and that they should receive a response within 2 business days.   Please advise at [facility] [phone]

## 2023-07-20 NOTE — Telephone Encounter (Signed)
 Medication refill complete and sent to pharmacy on file

## 2023-07-20 NOTE — Telephone Encounter (Signed)
 Prescription Request  07/20/2023  LOV: 07/17/2023  What is the name of the medication or equipment? rosuvastatin  (CRESTOR ) 40 MG tablet   Have you contacted your pharmacy to request a refill? Yes   Which pharmacy would you like this sent to?  [id number] [name], [place] - [address] [place] [address] Phone: [phone] Fax: [phone]    Patient notified that their request is being sent to the clinical staff for review and that they should receive a response within 2 business days.   Please advise at [facility] [phone]

## 2023-07-21 ENCOUNTER — Other Ambulatory Visit: Payer: Self-pay

## 2023-07-21 DIAGNOSIS — R0789 Other chest pain: Secondary | ICD-10-CM

## 2023-07-21 NOTE — Telephone Encounter (Signed)
 Requested Prescriptions  Pending Prescriptions Disp Refills   bisoprolol  (ZEBETA ) 5 MG tablet [Pharmacy Med Name: BISOPROLOL  FUMARATE 5 MG TAB] 45 tablet 1    Sig: TAKE 1/2 TABLET(2.5 MG) BY MOUTH DAILY     Cardiovascular: Beta Blockers 2 Passed - 07/21/2023  9:30 AM      Passed - Cr in normal range and within 360 days    Creat  Date Value Ref Range Status  07/07/2023 0.99 0.50 - 1.05 mg/dL Final   Creatinine, Urine  Date Value Ref Range Status  07/07/2023 54 20 - 275 mg/dL Final         Passed - Last BP in normal range    BP Readings from Last 1 Encounters:  07/17/23 132/72         Passed - Last Heart Rate in normal range    Pulse Readings from Last 1 Encounters:  07/17/23 84         Passed - Valid encounter within last 6 months    Recent Outpatient Visits           4 days ago Type 2 diabetes mellitus with other circulatory complication, without long-term current use of insulin  (HCC)   [place] [facility] Austine Lefort, MD   3 months ago Type 2 diabetes mellitus with other circulatory complication, without long-term current use of insulin  [facility])   [place] [facility] Pickard, Cisco Crest, MD   3 months ago Acute cough   [place] [facility] Cheril Cork, Cisco Crest, MD   8 months ago Visit for suture removal   [place] [facility] Austine Lefort, MD   9 months ago Type 2 diabetes mellitus with diabetic neuropathy, unspecified whether long term insulin  use [facility])   [place] [facility] Pickard, Cisco Crest, MD       Future Appointments             In 2 weeks Alvenia Aus, Tia Flowers, MD [facility] [employer], [employer]

## 2023-07-24 ENCOUNTER — Other Ambulatory Visit: Payer: Self-pay

## 2023-07-24 ENCOUNTER — Ambulatory Visit
Admission: RE | Admit: 2023-07-24 | Discharge: 2023-07-24 | Disposition: A | Discharge status: Home / Self Care | Source: Ambulatory Visit | Attending: Family Medicine | Admitting: Family Medicine

## 2023-07-24 ENCOUNTER — Telehealth: Payer: Self-pay

## 2023-07-24 DIAGNOSIS — R0789 Other chest pain: Secondary | ICD-10-CM | POA: Diagnosis not present

## 2023-07-24 DIAGNOSIS — I25118 Atherosclerotic heart disease of native coronary artery with other forms of angina pectoris: Secondary | ICD-10-CM

## 2023-07-24 DIAGNOSIS — E1159 Type 2 diabetes mellitus with other circulatory complications: Secondary | ICD-10-CM

## 2023-07-24 MED ORDER — ROSUVASTATIN CALCIUM 40 MG PO TABS: ORAL_TABLET | ORAL | 1 refills | Status: DC

## 2023-07-24 NOTE — Telephone Encounter (Signed)
 Prescription Request  07/24/2023  LOV: 07/17/23  What is the name of the medication or equipment? rosuvastatin  (CRESTOR ) 40 MG tablet [[id number]]   Have you contacted your pharmacy to request a refill? Yes   Which pharmacy would you like this sent to?  [name], [place] - [address] [place] [address] Phone: [phone] Fax: [phone]    Patient notified that their request is being sent to the clinical staff for review and that they should receive a response within 2 business days.   Please advise at [facility] [phone]

## 2023-07-25 DIAGNOSIS — M7541 Impingement syndrome of right shoulder: Secondary | ICD-10-CM | POA: Diagnosis not present

## 2023-07-25 DIAGNOSIS — M542 Cervicalgia: Secondary | ICD-10-CM | POA: Diagnosis not present

## 2023-07-25 DIAGNOSIS — M7542 Impingement syndrome of left shoulder: Secondary | ICD-10-CM | POA: Diagnosis not present

## 2023-07-25 DIAGNOSIS — M47812 Spondylosis without myelopathy or radiculopathy, cervical region: Secondary | ICD-10-CM | POA: Diagnosis not present

## 2023-07-27 ENCOUNTER — Ambulatory Visit: Payer: Self-pay | Admitting: Family Medicine

## 2023-08-01 NOTE — Telephone Encounter (Unsigned)
 Copied from CRM [id number]. Topic: Clinical - Medication Question >> Aug 01, 2023  8:06 AM Lotus Round B wrote: Reason for CRM: pt called in to see if Dr.Pickard nurse can give her a call about some medication she was put on . She would like a call around 10am

## 2023-08-08 ENCOUNTER — Ambulatory Visit: Payer: Self-pay | Admitting: Cardiovascular Disease

## 2023-08-08 ENCOUNTER — Encounter: Payer: Self-pay | Admitting: Cardiovascular Disease

## 2023-08-08 ENCOUNTER — Ambulatory Visit (HOSPITAL_COMMUNITY)
Admission: RE | Admit: 2023-08-08 | Discharge: 2023-08-08 | Discharge status: Home / Self Care | Source: Ambulatory Visit | Attending: Cardiovascular Disease | Admitting: Cardiovascular Disease

## 2023-08-08 ENCOUNTER — Ambulatory Visit
Admission: RE | Admit: 2023-08-08 | Discharge: 2023-08-08 | Disposition: A | Discharge status: Home / Self Care | Source: Ambulatory Visit | Attending: Cardiovascular Disease | Admitting: Cardiovascular Disease

## 2023-08-08 ENCOUNTER — Ambulatory Visit: Admitting: Cardiovascular Disease

## 2023-08-08 ENCOUNTER — Other Ambulatory Visit: Payer: Self-pay | Admitting: Family Medicine

## 2023-08-08 VITALS — BP 132/81 | HR 81 | Ht 62.0 in | Wt 186.8 lb

## 2023-08-08 DIAGNOSIS — I739 Peripheral vascular disease, unspecified: Secondary | ICD-10-CM | POA: Diagnosis not present

## 2023-08-08 DIAGNOSIS — I25118 Atherosclerotic heart disease of native coronary artery with other forms of angina pectoris: Secondary | ICD-10-CM | POA: Insufficient documentation

## 2023-08-08 DIAGNOSIS — E1159 Type 2 diabetes mellitus with other circulatory complications: Secondary | ICD-10-CM

## 2023-08-08 DIAGNOSIS — E785 Hyperlipidemia, unspecified: Secondary | ICD-10-CM

## 2023-08-08 LAB — VAS US ABI WITH/WO TBI
Left ABI: 0.83
Right ABI: 0.83

## 2023-08-08 NOTE — Patient Instructions (Signed)
 Medication Instructions:  Your physician recommends that you continue on your current medications as directed. Please refer to the Current Medication list given to you today.    *If you need a refill on your cardiac medications before your next appointment, please call your pharmacy*   Lab Work: NONE    If you have labs (blood work) drawn today and your tests are completely normal, you will receive your results only by: MyChart Message (if you have MyChart) OR A paper copy in the mail If you have any lab test that is abnormal or we need to change your treatment, we will call you to review the results.   Testing/Procedures: NONE    Follow-Up: At [facility], you and your health needs are our priority.  As part of our continuing mission to provide you with exceptional heart care, we have created designated Provider Care Teams.  These Care Teams include your primary Cardiologist (physician) and Advanced Practice Providers (APPs -  Physician Assistants and Nurse Practitioners) who all work together to provide you with the care you need, when you need it.  We recommend signing up for the patient portal called MyChart.  Sign up information is provided on this After Visit Summary.  MyChart is used to connect with patients for Virtual Visits (Telemedicine).  Patients are able to view lab/test results, encounter notes, upcoming appointments, etc.  Non-urgent messages can be sent to your provider as well.   To learn more about what you can do with MyChart, go to [email or web address].    Your next appointment:   6 month(s)  The format for your next appointment:   In Person  Provider:   Deatrice Cage, MD   Other Instructions

## 2023-08-08 NOTE — Progress Notes (Signed)
 Cardiology Office Note   Date:  08/08/2023   ID:  Leslie, [name] 19-Feb-1955, MRN [id number]  PCP:  Duanne Butler DASEN, MD  Cardiologist:  Darron Johns chief complaint on file.      History of Present Illness: [name] is a 68 y.o. female who presents for a follow up visit regarding regarding peripheral arterial disease and coronary artery disease. She has known history of peripheral arterial disease status post atherectomy of the right SFA in 2011 by Dr. Barbette. She has other chronic medical conditions including CAD, DM, HTN, chronic back pain, recurrent idiopathic pancreatitis and hyperlipidemia. She is a former smoker.  She is s/p bilateral TKR.   She had worsening right leg claudication in April 2017. Angiography showed no significant aortoiliac disease. There was moderate right common femoral artery stenosis and severe discrete stenosis in the proximal right SFA with three-vessel runoff below the knee. There was no significant obstructive disease involving the left lower extremity. A self-expanding stent was placed to the proximal right SFA. She had previous bilateral knee replacement and chronic bilateral leg pain.   She had worsening left leg claudication in 2021.  Vascular studies showed mildly reduced ABI bilaterally in the 0.8 range. Angiography was done in March of 2021 which showed borderline stenosis affecting the left common iliac artery, severe calcified stenosis affecting the left proximal SFA and three-vessel runoff below the knee.  On the right, there was severe calcified disease affecting the common femoral artery with patent proximal SFA stent followed by 60% disease throughout the whole SFA and three-vessel runoff below the knee.  I performed successful orbital atherectomy and drug-coated balloon angioplasty to the left SFA.   She underwent staged right common femoral artery endarterectomy by Dr. Gretta in August of 2021.  She had worsening right  calf claudication in 2024.  She did not tolerate cilostazol  due to GI symptoms and headache. Angiography was performed in August 2024  which showed severe diffuse and calcified disease affecting the whole length of the right SFA and proximal popliteal artery with three-vessel runoff below the knee.  There was mild disease on the left side.  I performed intravascular lithotripsy and drug-coated balloon angioplasty to the right SFA.  Postprocedure ABI improved to normal.  She had follow-up Doppler studies today which showed an ABI of 0.83 bilaterally. Duplex was personally reviewed by me and showed significant restenosis in the right proximal popliteal artery.  On the left, there was moderate SFA disease.  She reports stable bilateral calf claudication.  She had recent episodes of right-sided chest pain that different from her prior angina.  Ultrasound of abdomen showed no evidence of gallbladder disease.  She also had few episodes of left-sided chest pain both at rest and with exertion.  1 episode was intense but that was about 2 months ago with no recurrent symptoms.   Past Medical History:  Diagnosis Date   Aortic atherosclerosis (HCC)    Asthma    CAD (coronary artery disease)    a. LHC 2011: Diffuse distal and branch vessel disease - patient managed medically, no interventional options. b. Nuc 03/2014 - low risk, no ischemia.   Complication of anesthesia    slow to wake up with last surgery in 2011    Diabetes mellitus with circulatory complication (HCC)    Diverticulosis    Dyslipidemia    GERD (gastroesophageal reflux disease)    Headache    hx of migraines    Hypertension  Osteoarthritis    severe right knee  R TKR   PAD (peripheral artery disease) (HCC)    a. Severe stenosis mid right SFA s/p atherectomy 03/25/09. b. peripheral angiography in 12/2011 which showed only about 50% diffuse right SFA stenosis.   Pancreatitis 2003   Renal artery stenosis (HCC)    a. LHC 2011 - 40-50%  left RAS.   Tobacco use disorder    quit 11/10    Past Surgical History:  Procedure Laterality Date   ABDOMINAL AORTAGRAM N/A 12/21/2011   Procedure: ABDOMINAL AORTAGRAM;  Surgeon: Deatrice DELENA Cage, MD;  Location: [facility];  Service: Cardiovascular;  Laterality: N/A;   ABDOMINAL AORTOGRAM W/LOWER EXTREMITY Bilateral 05/08/2019   Procedure: ABDOMINAL AORTOGRAM W/LOWER EXTREMITY;  Surgeon: Cage Deatrice DELENA, MD;  [facility]: [facility];  Service: Cardiovascular;  Laterality: Bilateral;   ABDOMINAL AORTOGRAM W/LOWER EXTREMITY N/A 09/28/2022   Procedure: ABDOMINAL AORTOGRAM W/LOWER EXTREMITY;  Surgeon: Cage Deatrice DELENA, MD;  [facility]: [facility];  Service: Cardiovascular;  Laterality: N/A;   CARDIAC CATHETERIZATION     ENDARTERECTOMY FEMORAL Right 10/10/2019   Procedure: RIGHT FEMORAL ENDARTERECTOMY;  Surgeon: Gretta Lonni PARAS, MD;  Location: [facility] OR;  Service: Vascular;  Laterality: Right;   ESOPHAGOGASTRODUODENOSCOPY (EGD) WITH PROPOFOL  N/A 10/05/2017   Procedure: ESOPHAGOGASTRODUODENOSCOPY (EGD) WITH PROPOFOL ;  Surgeon: Teressa Toribio SQUIBB, MD;  Location: [facility];  Service: Endoscopy;  Laterality: N/A;   EUS N/A 10/05/2017   Procedure: UPPER ENDOSCOPIC ULTRASOUND (EUS) RADIAL;  Surgeon: Teressa Toribio SQUIBB, MD;  Location: [facility];  Service: Endoscopy;  Laterality: N/A;   FOOT SURGERY Left    LOWER EXTREMITY ANGIOGRAM Bilateral 05/27/2015   Procedure: Lower Extremity Angiogram;  Surgeon: Deatrice DELENA Cage, MD;  [facility];  Service: Cardiovascular;  Laterality: Bilateral;   PATCH ANGIOPLASTY Right 10/10/2019   Procedure: PATCH ANGIOPLASTY USING GEORGE BIOLOGIC PATCH;  Surgeon: Gretta Lonni PARAS, MD;  Location: [facility] OR;  Service: Vascular;  Laterality: Right;   PERIPHERAL INTRAVASCULAR LITHOTRIPSY  09/28/2022   Procedure: PERIPHERAL INTRAVASCULAR LITHOTRIPSY;  Surgeon: Cage Deatrice DELENA, MD;  [facility];  Service: Cardiovascular;;    PERIPHERAL VASCULAR ATHERECTOMY Left 05/08/2019   Procedure: PERIPHERAL VASCULAR ATHERECTOMY;  Surgeon: Cage Deatrice DELENA, MD;  [facility];  Service: Cardiovascular;  Laterality: Left;   PERIPHERAL VASCULAR CATHETERIZATION N/A 05/27/2015   Procedure: Abdominal Aortogram;  Surgeon: Deatrice DELENA Cage, MD;  [facility];  Service: Cardiovascular;  Laterality: N/A;   PERIPHERAL VASCULAR CATHETERIZATION Right 05/27/2015   Procedure: Peripheral Vascular Intervention;  Surgeon: Deatrice DELENA Cage, MD;  [facility];  Service: Cardiovascular;  Laterality: Right;  SFA   PERIPHERAL VASCULAR INTERVENTION  09/28/2022   Procedure: PERIPHERAL VASCULAR INTERVENTION;  Surgeon: Cage Deatrice DELENA, MD;  [facility];  Service: Cardiovascular;;   TOTAL KNEE ARTHROPLASTY Right 2011   TOTAL KNEE ARTHROPLASTY Left 05/22/2014   Procedure: LEFT TOTAL KNEE ARTHROPLASTY;  Surgeon: Tanda Heading, MD;  Location: [facility];  Service: Orthopedics;  Laterality: Left;   TRIGGER FINGER RELEASE Left 07/07/2021   Procedure: LEFT INDEX FINGER RELEASE TRIGGER FINGER/A-1 PULLEY / CARPOMETACARPAL INJECTION;  Surgeon: Romona Harari, MD;  Location: [facility];  Service: Orthopedics;  Laterality: Left;   TUBAL LIGATION  1980     Current Outpatient Medications  Medication Sig Dispense Refill   acetaminophen  (TYLENOL ) 500 MG tablet Take 1,000 mg by mouth every 6 (six) hours as needed  for moderate pain or headache.     albuterol  (VENTOLIN  HFA) 108 (90 Base) MCG/ACT inhaler Inhale 2 puffs into the lungs every 6 (six) hours as needed for wheezing or shortness of breath. 8.5 each 3   aspirin  EC 81 MG tablet Take 1 tablet (81 mg total) by mouth daily. Swallow whole.     bisoprolol  (ZEBETA ) 5 MG tablet TAKE 1/2 TABLET(2.5 MG) BY MOUTH DAILY 45 tablet 1   Calcium  Polycarbophil (FIBER-CAPS PO) Take 1 capsule by mouth daily.     clopidogrel  (PLAVIX ) 75 MG tablet TAKE 1  TABLET(75 MG) BY MOUTH DAILY 90 tablet 1   COLLAGEN PO Take 1,000 mg by mouth daily.     Cyanocobalamin (VITAMIN B-12) 5000 MCG TBDP Take 5,000 mcg by mouth daily.     dapagliflozin  propanediol (FARXIGA ) 10 MG TABS tablet Take 1 tablet (10 mg total) by mouth daily. 90 tablet 0   diclofenac  sodium (VOLTAREN ) 1 % GEL Apply 2 g topically 4 (four) times daily. (Patient taking differently: Apply 2 g topically 4 (four) times daily as needed (for hand pain).) 100 g 0   doxycycline  (VIBRA -TABS) 100 MG tablet Take 1 tablet (100 mg total) by mouth 2 (two) times daily. 14 tablet 0   fluticasone -salmeterol (ADVAIR  DISKUS) 250-50 MCG/ACT AEPB Inhale 1 puff into the lungs in the morning and at bedtime. 60 each 11   HYDROcodone -acetaminophen  (NORCO/VICODIN) 5-325 MG tablet Take 1 tablet by mouth every 6 (six) hours as needed for moderate pain (pain score 4-6). Patient needs to schedule office visit 130 tablet 0   isosorbide  mononitrate (IMDUR ) 60 MG 24 hr tablet TAKE 1 TABLET BY MOUTH EVERY DAY 90 tablet 1   ketorolac  (ACULAR ) 0.5 % ophthalmic solution Place 1 drop into the left eye 4 (four) times daily.     lipase/protease/amylase (CREON ) 36000 UNITS CPEP capsule Take 3 capsules (108,000 Units total) by mouth 3 (three) times daily with meals. May also take 1 capsule (36,000 Units total) as needed (with snacks). 200 capsule 3   loratadine (CLARITIN) 10 MG tablet Take 10 mg by mouth daily.     metFORMIN  (GLUCOPHAGE ) 1000 MG tablet Take 1 tablet (1,000 mg total) by mouth 2 (two) times daily with a meal. 180 tablet 2   methocarbamol  (ROBAXIN ) 500 MG tablet TAKE 1 TABLET BY MOUTH EVERY 6 HOURS AS NEEDED FOR MUSCLE SPASM 120 tablet 3   Multiple Vitamin (MULTIVITAMIN) tablet Take 1 tablet by mouth daily.     nitroGLYCERIN  (NITROSTAT ) 0.4 MG SL tablet Place 1 tablet under the tongue every 5 (five) minutes as needed for chest pain.     ofloxacin (OCUFLOX) 0.3 % ophthalmic solution Place 1 drop into the left eye 4 (four)  times daily.     ONETOUCH ULTRA TEST test strip Use to check blood sugars up to 4 times per day. 100 each 1   pantoprazole  (PROTONIX ) 40 MG tablet Take 1 tablet (40 mg total) by mouth daily. 90 tablet 1   pioglitazone  (ACTOS ) 30 MG tablet TAKE 1 TABLET BY MOUTH EVERY DAY 90 tablet 1   prednisoLONE acetate (PRED FORTE) 1 % ophthalmic suspension Place 1 drop into the left eye 4 (four) times daily.     pregabalin  (LYRICA ) 50 MG capsule Take 1 capsule (50 mg total) by mouth 3 (three) times daily. 90 capsule 2   Probiotic Product (TRUBIOTICS PO) Take 1 capsule by mouth daily.     promethazine  (PHENERGAN ) 25 MG tablet TAKE 1 TABLET(25 MG) BY MOUTH  EVERY 8 HOURS AS NEEDED FOR NAUSEA OR VOMITING 30 tablet 2   promethazine -dextromethorphan (PROMETHAZINE -DM) 6.25-15 MG/5ML syrup Take 5 mLs by mouth 4 (four) times daily as needed for cough. 118 mL 0   rosuvastatin  (CRESTOR ) 40 MG tablet TAKE 1 TABLET(40 MG) BY MOUTH EVERY NIGHT 90 tablet 1   sitaGLIPtin  (JANUVIA ) 100 MG tablet TAKE 1 TABLET BY MOUTH EVERY DAY 90 tablet 0   No current facility-administered medications for this visit.    Allergies:   Gabapentin , Glucotrol [glipizide], Influenza vaccines, Latex, Nortriptyline , Tetanus toxoid, Other, Prednisone  & diphenhydramine, and Invokana  [canagliflozin ]    Social History:  The patient  reports that she quit smoking about 14 years ago. Her smoking use included cigarettes. She has never used smokeless tobacco. She reports that she does not drink alcohol and does not use drugs.   Family History:  The patient's family history includes Breast cancer (age of onset: 12) in her sister; CVA in her mother; Emphysema in her father.    ROS:  Please see the history of present illness.   Otherwise, review of systems are positive for .   All other systems are reviewed and negative.    PHYSICAL EXAM: VS:  BP 132/81 (BP Location: Left Arm, Patient Position: Sitting)   Pulse 81   Ht 5' 2 (1.575 m)   Wt 186 lb  12.8 oz (84.7 kg)   SpO2 99%   BMI 34.17 kg/m  , BMI Body mass index is 34.17 kg/m. GEN: Well nourished, well developed, in no acute distress  HEENT: normal  Neck: no JVD, carotid bruits, or masses Cardiac: RRR; no murmurs, rubs, or gallops,no edema  Respiratory:  clear to auscultation bilaterally, normal work of breathing GI: soft, nontender, nondistended, + BS MS: no deformity or atrophy  Skin: warm and dry, no rash Neuro:  Strength and sensation are intact Psych: euthymic mood, full affect Vascular: Femoral pulses: Normal bilaterally.  Distal pulses are palpable.  EKG:  EKG is ordered today. EKG showed: Normal sinus rhythm Low voltage QRS Nonspecific ST and T wave abnormality When compared with ECG of 26-Jun-2017 20:47, No significant change was found    Recent Labs: 07/07/2023: ALT 16; BUN 25; Creat 0.99; Hemoglobin 11.4; Platelets 248; Potassium 4.7; Sodium 140    Lipid Panel    Component Value Date/Time   CHOL 141 07/07/2023 1124   TRIG 215 (H) 07/07/2023 1124   HDL 48 (L) 07/07/2023 1124   CHOLHDL 2.9 07/07/2023 1124   VLDL 35 10/11/2019 0219   LDLCALC 65 07/07/2023 1124   LDLDIRECT 50.6 01/02/2014 1003      Wt Readings from Last 3 Encounters:  08/08/23 186 lb 12.8 oz (84.7 kg)  07/17/23 195 lb 3.2 oz (88.5 kg)  04/13/23 201 lb 9.6 oz (91.4 kg)       ASSESSMENT AND PLAN:  1.  PAD with intermittent claudication: Status post bilateral SFA intervention.  I reviewed results of Doppler studies with her.  ABIs mildly reduced bilaterally at 0.83.  Worst stenosis seems to be in the proximal right popliteal artery which is heavily calcified.  This is a tough area to treat especially with her prosthetic right knee and limited capacity to place a stent in that area of the wrist dissection.  I favor continued medical therapy for now especially that her symptoms are not lifestyle limiting.  2. Coronary artery disease involving native coronary arteries with other forms  of angina:   She has what seems to be atypical angina  probably with some worsening since last visit.  Previous cardiac catheterization in 2011 showed distal LAD and small branch disease which is being managed medically.  Most recent Lexiscan  Myoview  in May 2022 showed evidence of mild anterior wall ischemia.   Continue to monitor symptoms for now.  If there is worsening, the next step is to proceed with cardiac catheterization.  She is already on Imdur  60 mg daily.  3. Hyperlipidemia:  Continue treatment with high-dose rosuvastatin .  Most recent lipid profile showed an LDL of 65.  4. Diabetes mellitus: Managed by primary care physician.  Most recent hemoglobin A1c was 7.3.    Disposition:   Follow-up in 6 months.  Signed,  Deatrice Cage, MD  08/08/2023 10:55 AM    [facility]

## 2023-08-14 ENCOUNTER — Other Ambulatory Visit: Payer: Self-pay | Admitting: Family Medicine

## 2023-08-14 NOTE — Telephone Encounter (Unsigned)
 Copied from CRM [id number]. Topic: Clinical - Medication Refill >> Aug 14, 2023  9:35 AM [name] wrote: Medication: HYDROcodone -acetaminophen  (NORCO/VICODIN) 5-325 MG tablet Was given a sample but would like a prescription for -Rybelsus- 3MG   Has the patient contacted their pharmacy? No (Agent: If no, request that the patient contact the pharmacy for the refill. If patient does not wish to contact the pharmacy document the reason why and proceed with request.) (Agent: If yes, when and what did the pharmacy advise?)  This is the patient's preferred pharmacy:  [id number] [place], [place] - [address] [name] [address] Phone: [phone] Fax: [phone]  Is this the correct pharmacy for this prescription? Yes If no, delete pharmacy and type the correct one.   Has the prescription been filled recently? No  Is the patient out of the medication? No almost gone  Has the patient been seen for an appointment in the last year OR does the patient have an upcoming appointment? Yes  Can we respond through MyChart? No  Agent: Please be advised that Rx refills may take up to 3 business days. We ask that you follow-up with your pharmacy.

## 2023-08-15 NOTE — Telephone Encounter (Signed)
 Requested medications are due for refill today.  yes  Requested medications are on the active medications list.  yes  Last refill. 07/13/2023 #130 0 rf  Future visit scheduled.   no  Notes to clinic.  Refill not delegated.    Requested Prescriptions  Pending Prescriptions Disp Refills   HYDROcodone -acetaminophen  (NORCO/VICODIN) 5-325 MG tablet 130 tablet 0    Sig: Take 1 tablet by mouth every 6 (six) hours as needed for moderate pain (pain score 4-6). Patient needs to schedule office visit     Not Delegated - Analgesics:  Opioid Agonist Combinations Failed - 08/15/2023  5:53 PM      Failed - This refill cannot be delegated      Failed - Urine Drug Screen completed in last 360 days      Failed - Valid encounter within last 3 months    Recent Outpatient Visits           4 weeks ago Type 2 diabetes mellitus with other circulatory complication, without long-term current use of insulin  [facility])   [place] [facility] Duanne Butler DASEN, MD   4 months ago Type 2 diabetes mellitus with other circulatory complication, without long-term current use of insulin  [facility])   [place] [facility] Duanne Butler DASEN, MD   4 months ago Acute cough   [place] [facility] Duanne, Butler DASEN, MD   9 months ago Visit for suture removal   [place] [facility] Duanne Butler DASEN, MD   9 months ago Type 2 diabetes mellitus with diabetic neuropathy, unspecified whether long term insulin  use [facility])   [place] [facility] Pickard, Butler DASEN, MD

## 2023-08-16 ENCOUNTER — Other Ambulatory Visit: Payer: Self-pay

## 2023-08-16 ENCOUNTER — Other Ambulatory Visit (HOSPITAL_COMMUNITY): Payer: Self-pay

## 2023-08-16 ENCOUNTER — Telehealth: Payer: Self-pay | Admitting: Family Medicine

## 2023-08-16 ENCOUNTER — Telehealth: Payer: Self-pay | Admitting: Pharmacy Technician

## 2023-08-16 DIAGNOSIS — I25118 Atherosclerotic heart disease of native coronary artery with other forms of angina pectoris: Secondary | ICD-10-CM

## 2023-08-16 DIAGNOSIS — E1159 Type 2 diabetes mellitus with other circulatory complications: Secondary | ICD-10-CM

## 2023-08-16 MED ORDER — RYBELSUS 3 MG PO TABS
3.0000 mg | ORAL_TABLET | Freq: Every day | ORAL | 2 refills | Status: DC
Start: 2023-08-16 — End: 2023-10-24

## 2023-08-16 NOTE — Telephone Encounter (Unsigned)
 Copied from CRM [id number]. Topic: Clinical - Medication Refill >> Aug 16, 2023 10:24 AM Tiffini S wrote: Medication: Rybelsus 3mg    HYDROcodone -acetaminophen  (NORCO/VICODIN) 5-325 MG tablet- was requested on 08/14/23- patient have one tablet left for this afternoon for bedtime   Has the patient contacted their pharmacy? No (Agent: If no, request that the patient contact the pharmacy for the refill. If patient does not wish to contact the pharmacy document the reason why and proceed with request.) (Agent: If yes, when and what did the pharmacy advise?)  This is the patient's preferred pharmacy:  [id number] [place], [place] - [address] [place] [address] Phone: [phone] Fax: [phone]  Is this the correct pharmacy for this prescription? Yes If no, delete pharmacy and type the correct one.   Has the prescription been filled recently? No  Is the patient out of the medication? Yes, patient was given a sample to try and wants the prescription filled   Has the patient been seen for an appointment in the last year OR does the patient have an upcoming appointment? Yes  Can we respond through MyChart? No, patient asked for a phone call   Agent: Please be advised that Rx refills may take up to 3 business days. We ask that you follow-up with your pharmacy.

## 2023-08-16 NOTE — Telephone Encounter (Signed)
 Pharmacy Patient Advocate Encounter   Received notification from Onbase that prior authorization for Rybelsus 3MG  tablets is required/requested.   Insurance verification completed.   The patient is insured through [employer] ADVANTAGE/RX ADVANCE .   Per test claim: PA required; PA submitted to above mentioned insurance via CoverMyMeds Key/confirmation #/EOC [id number] Status is pending

## 2023-08-17 ENCOUNTER — Other Ambulatory Visit (HOSPITAL_COMMUNITY): Payer: Self-pay

## 2023-08-17 MED ORDER — HYDROCODONE-ACETAMINOPHEN 5-325 MG PO TABS: 1.0000 | ORAL_TABLET | Freq: Four times a day (QID) | ORAL | 0 refills | Status: DC | PRN

## 2023-08-17 NOTE — Telephone Encounter (Signed)
 Pharmacy Patient Advocate Encounter  Received notification from [facility] ADVANTAGE/RX ADVANCE that Prior Authorization for Rybelsus 3MG  tablets has been APPROVED from 08/16/23 to 08/15/24. Ran test claim, Copay is $0.00. This test claim was processed through [facility]- copay amounts may vary at other pharmacies due to pharmacy/plan contracts, or as the patient moves through the different stages of their insurance plan.   PA #/Case ID/Reference #: [id number]

## 2023-08-22 ENCOUNTER — Other Ambulatory Visit: Payer: Self-pay

## 2023-08-22 ENCOUNTER — Telehealth: Payer: Self-pay | Admitting: Family Medicine

## 2023-08-22 DIAGNOSIS — E1159 Type 2 diabetes mellitus with other circulatory complications: Secondary | ICD-10-CM

## 2023-08-22 MED ORDER — PIOGLITAZONE HCL 30 MG PO TABS: ORAL_TABLET | ORAL | 1 refills | Status: DC

## 2023-08-22 NOTE — Telephone Encounter (Signed)
 Prescription Request  08/22/2023  LOV: 07/17/2023  What is the name of the medication or equipment? pioglitazone  (ACTOS ) 30 MG tablet   Have you contacted your pharmacy to request a refill? Yes   Which pharmacy would you like this sent to?  [id number] [place], [place] - [address] [place] [address] Phone: [phone] Fax: [phone]    Patient notified that their request is being sent to the clinical staff for review and that they should receive a response within 2 business days.   Please advise at [facility] [phone]

## 2023-08-22 NOTE — Telephone Encounter (Signed)
 Med sent.

## 2023-08-24 ENCOUNTER — Other Ambulatory Visit: Payer: Self-pay | Admitting: Family Medicine

## 2023-08-24 DIAGNOSIS — E1159 Type 2 diabetes mellitus with other circulatory complications: Secondary | ICD-10-CM

## 2023-08-25 ENCOUNTER — Telehealth: Payer: Self-pay | Admitting: Family Medicine

## 2023-08-25 ENCOUNTER — Other Ambulatory Visit: Payer: Self-pay

## 2023-08-25 DIAGNOSIS — K861 Other chronic pancreatitis: Secondary | ICD-10-CM

## 2023-08-25 MED ORDER — PANCRELIPASE (LIP-PROT-AMYL) 36000-114000 UNITS PO CPEP: 108000.0000 [IU] | ORAL_CAPSULE | Freq: Three times a day (TID) | ORAL | 3 refills | Status: DC

## 2023-08-25 NOTE — Telephone Encounter (Signed)
 Prescription Request  08/25/2023  LOV: 07/17/2023  What is the name of the medication or equipment?   lipase/protease/amylase (CREON ) 36000 UNITS CPEP capsule   Have you contacted your pharmacy to request a refill? Yes   Which pharmacy would you like this sent to?  [id number] [place], [place] - [address] [place] [address] Phone: [phone] Fax: [phone]    Patient notified that their request is being sent to the clinical staff for review and that they should receive a response within 2 business days.   Please advise pharmacist.

## 2023-08-25 NOTE — Telephone Encounter (Signed)
 Sent in medications

## 2023-08-31 ENCOUNTER — Other Ambulatory Visit: Payer: Self-pay | Admitting: Family Medicine

## 2023-08-31 DIAGNOSIS — K861 Other chronic pancreatitis: Secondary | ICD-10-CM

## 2023-09-01 NOTE — Telephone Encounter (Signed)
 Requested Prescriptions  Refused Prescriptions Disp Refills   CREON  36000-114000 units CPEP capsule [Pharmacy Med Name: CREON  DR 36,000 UNIT CAPSULE] 200 capsule 3    Sig: PLEASE SEE ATTACHED FOR DETAILED DIRECTIONS     Off-Protocol Failed - 09/01/2023  3:03 PM      Failed - Medication not assigned to a protocol, review manually.      Passed - Valid encounter within last 12 months    Recent Outpatient Visits           1 month ago Type 2 diabetes mellitus with other circulatory complication, without long-term current use of insulin  [facility])   [place] [facility] Duanne Butler DASEN, MD   4 months ago Type 2 diabetes mellitus with other circulatory complication, without long-term current use of insulin  [facility])   [place] [facility] Duanne Butler DASEN, MD   5 months ago Acute cough   [place] [facility] Duanne, Butler DASEN, MD   9 months ago Visit for suture removal   [place] [facility] Duanne Butler DASEN, MD   10 months ago Type 2 diabetes mellitus with diabetic neuropathy, unspecified whether long term insulin  use [facility])   [place] [facility] Pickard, Butler DASEN, MD

## 2023-09-03 ENCOUNTER — Other Ambulatory Visit: Payer: Self-pay | Admitting: Family Medicine

## 2023-09-03 DIAGNOSIS — K861 Other chronic pancreatitis: Secondary | ICD-10-CM

## 2023-09-04 ENCOUNTER — Other Ambulatory Visit: Payer: Self-pay | Admitting: Family Medicine

## 2023-09-04 NOTE — Telephone Encounter (Unsigned)
 Copied from CRM [id number]. Topic: Clinical - Medication Refill >> Sep 04, 2023  3:33 PM Fonda T wrote: Medication: HYDROcodone -acetaminophen  (NORCO/VICODIN) 5-325 MG tablet  Has the patient contacted their pharmacy? Yes, pharmacy informed to contact to office  This is the patient's preferred pharmacy:  [id number] [place], [place] - [address] [place] [address] Phone: [phone] Fax: [phone]  Is this the correct pharmacy for this prescription? Yes If no, delete pharmacy and type the correct one.   Has the prescription been filled recently? Yes  Is the patient out of the medication? No  Has the patient been seen for an appointment in the last year OR does the patient have an upcoming appointment? Yes  Can we respond through MyChart? No  Agent: Please be advised that Rx refills may take up to 3 business days. We ask that you follow-up with your pharmacy.

## 2023-09-05 NOTE — Telephone Encounter (Signed)
 Refilled and picked up yesterday

## 2023-09-05 NOTE — Telephone Encounter (Signed)
 Requested Prescriptions  Refused Prescriptions Disp Refills   CREON  36000-114000 units CPEP capsule [Pharmacy Med Name: CREON  DR 36,000 UNIT CAPSULE] 200 capsule 3    Sig: PLEASE SEE ATTACHED FOR DETAILED DIRECTIONS     Off-Protocol Failed - 09/05/2023  2:34 PM      Failed - Medication not assigned to a protocol, review manually.      Passed - Valid encounter within last 12 months    Recent Outpatient Visits           1 month ago Type 2 diabetes mellitus with other circulatory complication, without long-term current use of insulin  (HCC)   [place] [facility] Duanne Butler DASEN, MD   4 months ago Type 2 diabetes mellitus with other circulatory complication, without long-term current use of insulin  [facility])   [place] [facility] Duanne Butler DASEN, MD   5 months ago Acute cough   [place] [facility] Duanne, Butler DASEN, MD   9 months ago Visit for suture removal   [place] [facility] Duanne Butler DASEN, MD   10 months ago Type 2 diabetes mellitus with diabetic neuropathy, unspecified whether long term insulin  use [facility])   [place] [facility] Pickard, Butler DASEN, MD

## 2023-09-06 NOTE — Telephone Encounter (Signed)
 Requested medication (s) are due for refill today:   Provider to review  Requested medication (s) are on the active medication list:   Yes  Future visit scheduled:   No.  LOV 07/17/2023 with Dr. Duanne   Last ordered: 08/17/2023 #130, 0 refills  Non delegated refill    Requested Prescriptions  Pending Prescriptions Disp Refills   HYDROcodone -acetaminophen  (NORCO/VICODIN) 5-325 MG tablet 130 tablet 0    Sig: Take 1 tablet by mouth every 6 (six) hours as needed for moderate pain (pain score 4-6). Patient needs to schedule office visit     Not Delegated - Analgesics:  Opioid Agonist Combinations Failed - 09/06/2023  3:15 PM      Failed - This refill cannot be delegated      Failed - Urine Drug Screen completed in last 360 days      Passed - Valid encounter within last 3 months    Recent Outpatient Visits           1 month ago Type 2 diabetes mellitus with other circulatory complication, without long-term current use of insulin  (HCC)   [place] [facility] Duanne Butler DASEN, MD   4 months ago Type 2 diabetes mellitus with other circulatory complication, without long-term current use of insulin  [facility])   [place] [facility] Duanne Butler DASEN, MD   5 months ago Acute cough   [place] [facility] Family Medicine Pickard, Butler DASEN, MD   10 months ago Visit for suture removal   [place] [facility] Duanne Butler DASEN, MD   10 months ago Type 2 diabetes mellitus with diabetic neuropathy, unspecified whether long term insulin  use [facility])   [place] [facility] Family [facility] Pickard, Butler DASEN, MD

## 2023-09-07 MED ORDER — HYDROCODONE-ACETAMINOPHEN 5-325 MG PO TABS: 1.0000 | ORAL_TABLET | Freq: Four times a day (QID) | ORAL | 0 refills | Status: DC | PRN

## 2023-09-18 ENCOUNTER — Telehealth: Payer: Self-pay | Admitting: Cardiovascular Disease

## 2023-09-18 NOTE — Telephone Encounter (Signed)
  Pt c/o of Chest Pain: STAT if active CP, including tightness, pressure, jaw pain, radiating pain to shoulder/upper arm/back, CP unrelieved by Nitro. Symptoms reported of SOB, nausea, vomiting, sweating.  1. Are you having CP right now? No     2. Are you experiencing any other symptoms (ex. SOB, nausea, vomiting, sweating)? Constant headache, SOB when    3. Is your CP continuous or coming and going? coming and going   4. Have you taken Nitroglycerin ? Yes, 2 times since last week    5. How long have you been experiencing CP? Since her last visit June 2025   6. If NO CP at time of call then end call with telling Pt to call back or call 911 if Chest pain returns prior to return call from triage team.   Pt said, her CP is getting worst, it comes and go during the day. Sometimes the pain radiate to her right arm and her back (right side)

## 2023-09-18 NOTE — Telephone Encounter (Signed)
 Called patient - reports that CP unrelieved with nitroglycerin  x 3 days   Advised patient to go to ED but to call 911 or have someone drive her  Reluctant to go due to waiting - encouraged her to go and get checked out

## 2023-09-20 NOTE — Telephone Encounter (Signed)
 If she did not go to the ED, she needs an appointment with APP for evaluation.

## 2023-09-20 NOTE — Telephone Encounter (Signed)
 The patient declined to go to the ED. She stated that she has been feeling better.  The patient also declined an office visit for this week. She has an appointment on 09/26/23.   Patient made aware of ED precautions should new or worsening symptoms occur. Patient verbalized understanding.

## 2023-09-25 NOTE — H&P (View-Only) (Signed)
 Cardiology Office Note:  .   Date:  09/26/2023  ID:  [name], DOB 1955/06/01, MRN [id number] PCP: Duanne Butler DASEN, MD  [facility] Providers Cardiologist:  Deatrice Cage, MD {  History of Present Illness: .   [name] is a 68 y.o. female  with PMHx of PAD s/p (atherectomy of the right SFA in 2011 by Dr. Barbette, self expanding stent placed in proximal right SFA in 2017, orbital atherectomy and drug-coated balloon angioplasty to the left SFA in 09/2019 by Dr. Cage with staged RCFA endarterectomy by Dr. Gretta, intravascular lithotripsy and drug-coated balloon angioplasty to the right SFA in 09/2022 by Dr. Cage), CAD (cath in 2011 showed diffuse disease. There was a large D3 with serial 70%, 80-90%, and 70% stenoses then severe diffuse distal vessel disease. Small to moderate OM1 with severe diffuse disease, 50% mRCA, 95% mid small PLV, 60% PDA, which was medically managed. Most recent Lexiscan  in 06/2020 with mild anterior wall ischemia, which was also medically managed), HLD, DM, HTN, chronic back pain, recurrent idiopathic pancreatitis, former smoker who reports to [facility] office for follow up.   Last seen in heartcare OV 08/09/2023 with Dr. Cage for follow up. Reported episodes of right sides CP different from prior angina. Also noted, few episodes of left side CP both at rest and with exertion. EKG showed NSR with nonspecific ST and T abnormality and no significant change. Suspected atypical CP with no further ischemic evaluation. Noted if symptoms worsened then the next step would be cath. Continue on ASA 81 mg, Bisoprolol  2.5 mg daily, Plavix  75 mg daily, Farxiga  10 mg daily, Imdur  60 mg daily, NTG prn, Crestor  40 mg   Patient called on 09/18/2023 and reported CP unrelieved with NTG x3. Recommended ED evaluation but patient declined and was given ED precautions. Also offered office visit for this week but patient declined and preferred to discuss at scheduled  appointment on today.   On interview, Reports ongoing intermittent CP about 2-3 x per week that has been getting worse. CP described as elephant sitting on chest located in the middle of chest to right side of chest, 8/10, lasting 5-10 mins, radiating to L arm and jaw. Associated with rest but noted worse with exertion, mainly walking. Some relief with NTG but only for 30 mins. Associated with SOB, intermittent palpitations (skipping a beat, 2-3 x/week, lasting 5-10 mins), diaphoresis.  Denies dizziness, n/v, syncope.  Denies heavy lifting or recent illness. Also noted LE edema by end of day relieved with leg elevation and rest. Denies any orthopnea or PND. She cares for disabled husband and is not able to go to hospital unless she has care already set up for him.  Reports compliance with medications. Denies tobacco use/alcohol/drug use. Denies any recent hospitalizations or visits to the emergency department.     ROS: 10 point review of system has been reviewed and considered negative except ones been listed in the HPI.   Studies Reviewed: SABRA   EKG Interpretation Date/Time:  Tuesday September 26 2023 09:03:48 EDT Ventricular Rate:  80 PR Interval:  162 QRS Duration:  82 QT Interval:  390 QTC Calculation: 449 R Axis:   46  Text Interpretation: Normal sinus rhythm Low voltage QRS Nonspecific ST and T wave abnormality When compared with ECG of 08-Aug-2023 10:30, No significant change was found Confirmed by Sheron Hallmark ([id number]) on 09/26/2023 9:29:08 AM   LHC (1/11) showed diffuse disease.  There was a large D3 with serial  70%, 80-90%, and 70% stenoses then severe diffuse distal vessel disease.  Small to moderate OM1 with severe diffuse disease, 50% mRCA, 95% mid small PLV, 60% PDA.  EF 60%.  Patient managed medically, no interventional options.    Lexiscan  06/2020 Lexiscan  stress is electrically negative for ischemia Myoview  scan with small anterior defect (mid) that improves in recovery. May  reflect shifting breast attenuation; cannot exclude small region of ischemia LVEF 67% with normal wall motion OVerall low risk scan.  Vas US  LE Arterial 07/2023 Summary:  Right: Mild progression is noted compared to previous study.   Diffuse atherosclerosis.  Patent proximal SFA stent without evidence of stenosis.  75-99% stenosis in the above-the-knee popliteal artery.  30-49% stenosis in the behind-and-below-the-knee popliteal artery.   Left: Mild progression is noted compared to previous study.   Diffuse atherosclerosis.  30-49% stenosis in the proximal CFA.  30-49% stenosis in the ostial SFA.  50-74% stenosis in the mid SFA, low end range  Vas Us  ABI 07/2023 Summary:  Right: Resting right ankle-brachial index indicates mild right lower  extremity arterial disease. The right toe-brachial index is normal.   Left: Resting left ankle-brachial index indicates mild left lower  extremity arterial disease. The left toe-brachial index is normal.   Physical Exam:   VS:  BP 126/64   Pulse 80   Ht 5' 3 (1.6 m)   Wt 183 lb (83 kg)   SpO2 98%   BMI 32.42 kg/m    Wt Readings from Last 3 Encounters:  09/26/23 183 lb (83 kg)  08/08/23 186 lb 12.8 oz (84.7 kg)  07/17/23 195 lb 3.2 oz (88.5 kg)    GEN: Well nourished, well developed in no acute distress while sitting in chair.  NECK: No JVD; No carotid bruits CARDIAC: RRR, no murmurs, rubs, gallops RESPIRATORY:  Clear to auscultation without rales, wheezing or rhonchi  ABDOMEN: Soft, non-tender, non-distended EXTREMITIES:  No edema; No deformity   ASSESSMENT AND PLAN: .   Coronary artery disease involving native coronary artery of native heart without angina pectoris Hyperlipidemia LDL goal <70 ETT-myoview  (12/10) with apical anterior ischemia, EF 75%.  LHC (1/11) showed diffuse disease. There was a large D3 with serial 70%, 80-90%, and 70% stenoses then severe diffuse distal vessel disease. Small to moderate OM1 with severe  diffuse disease, 50% mRCA, 95% mid small PLV, 60% PDA. EF 60%. Patient managed medically, no interventional options Lexiscan  06/2020 showed evidence of anterior wall ischemia. Recommended continuing with medical management.  Reports worsening exertional chest pain as above.  Associated with SOB, palpitations and diaphoresis. Recommended ED evaluation but patient declines due to being primary caregiver for her disabled husband.  Offered outpatient cath and patient agrees to proceed. Informed consent is discussed. Scheduled for outpatient catheterization on 10/05/2023 with Dr. Darron at 7:30 am. Recommended that patient set up care for her husband for at least 2 to 3 days depending on results of cath.  Order BMET and CBC to be completed within 30 days of cath.  Order ECHO (CP, SOB, LE Edema) Hold Farxiga  3 days prior to cath Hold metformin  24 hours before and 48 hours after cath 06/2023 LDL of 65, Triglyc of 215, AST/ALT WNL  Increase Imdur  to 90 mg daily. Continue on ASA 81 mg, Bisoprolol  2.5 mg daily, Plavix  75 mg daily, NTG prn, Crestor  40 mg   Palpitations Reports intermittent palpitations  associated with CP as above (skipping a beat, 2-3 x/week, lasting 5-10 mins) Order 2 week zio monitor  PAD (peripheral artery disease) (HCC) S/P angioplasty with stent Extensive PAD history as above  Continue with medical therapy.   Type 2 diabetes mellitus without complication, without long-term current use of insulin  (HCC) 06/2023 A1C 7.3  Managed by PCP   Essential hypertension Does not monitor Home BP. Encouraged to monitor home BP ands discussed proper BP measurement practice.  BP this OV well controlled today: 126/84 Continue on Bisoprolol  and imdur  as above.  Encourage physical activity for 150 minutes per week and heart healthy low sodium diet. Discussed limiting sodium intake to < 2 grams daily.    Informed Consent   Shared Decision Making/Informed Consent{ The risks [stroke (1 in 1000),  death (1 in 1000), kidney failure [usually temporary] (1 in 500), bleeding (1 in 200), allergic reaction [possibly serious] (1 in 200)], benefits (diagnostic support and management of coronary artery disease) and alternatives of a cardiac catheterization were discussed in detail with Ms. Patella and she is willing to proceed.  Dispo: Follow-up in 1 to 2 weeks post catheterization.   Signed, Lorette CINDERELLA Kapur, PA-C

## 2023-09-25 NOTE — Progress Notes (Signed)
 Cardiology Office Note:  .   Date:  09/26/2023  ID:  [name], DOB 08-22-1955, MRN [id number] PCP: Duanne Butler DASEN, MD  [facility] Providers Cardiologist:  Deatrice Cage, MD {  History of Present Illness: .   [name] is a 68 y.o. female  with PMHx of PAD s/p (atherectomy of the right SFA in 2011 by Dr. Barbette, self expanding stent placed in proximal right SFA in 2017, orbital atherectomy and drug-coated balloon angioplasty to the left SFA in 09/2019 by Dr. Cage with staged RCFA endarterectomy by Dr. Gretta, intravascular lithotripsy and drug-coated balloon angioplasty to the right SFA in 09/2022 by Dr. Cage), CAD (cath in 2011 showed diffuse disease. There was a large D3 with serial 70%, 80-90%, and 70% stenoses then severe diffuse distal vessel disease. Small to moderate OM1 with severe diffuse disease, 50% mRCA, 95% mid small PLV, 60% PDA, which was medically managed. Most recent Lexiscan  in 06/2020 with mild anterior wall ischemia, which was also medically managed), HLD, DM, HTN, chronic back pain, recurrent idiopathic pancreatitis, former smoker who reports to [facility] office for follow up.   Last seen in heartcare OV 08/09/2023 with Dr. Cage for follow up. Reported episodes of right sides CP different from prior angina. Also noted, few episodes of left side CP both at rest and with exertion. EKG showed NSR with nonspecific ST and T abnormality and no significant change. Suspected atypical CP with no further ischemic evaluation. Noted if symptoms worsened then the next step would be cath. Continue on ASA 81 mg, Bisoprolol  2.5 mg daily, Plavix  75 mg daily, Farxiga  10 mg daily, Imdur  60 mg daily, NTG prn, Crestor  40 mg   Patient called on 09/18/2023 and reported CP unrelieved with NTG x3. Recommended ED evaluation but patient declined and was given ED precautions. Also offered office visit for this week but patient declined and preferred to discuss at scheduled  appointment on today.   On interview, Reports ongoing intermittent CP about 2-3 x per week that has been getting worse. CP described as elephant sitting on chest located in the middle of chest to right side of chest, 8/10, lasting 5-10 mins, radiating to L arm and jaw. Associated with rest but noted worse with exertion, mainly walking. Some relief with NTG but only for 30 mins. Associated with SOB, intermittent palpitations (skipping a beat, 2-3 x/week, lasting 5-10 mins), diaphoresis.  Denies dizziness, n/v, syncope.  Denies heavy lifting or recent illness. Also noted LE edema by end of day relieved with leg elevation and rest. Denies any orthopnea or PND. She cares for disabled husband and is not able to go to hospital unless she has care already set up for him.  Reports compliance with medications. Denies tobacco use/alcohol/drug use. Denies any recent hospitalizations or visits to the emergency department.     ROS: 10 point review of system has been reviewed and considered negative except ones been listed in the HPI.   Studies Reviewed: SABRA   EKG Interpretation Date/Time:  Tuesday September 26 2023 09:03:48 EDT Ventricular Rate:  80 PR Interval:  162 QRS Duration:  82 QT Interval:  390 QTC Calculation: 449 R Axis:   46  Text Interpretation: Normal sinus rhythm Low voltage QRS Nonspecific ST and T wave abnormality When compared with ECG of 08-Aug-2023 10:30, No significant change was found Confirmed by Sheron Hallmark ([id number]) on 09/26/2023 9:29:08 AM   LHC (1/11) showed diffuse disease.  There was a large D3 with serial  70%, 80-90%, and 70% stenoses then severe diffuse distal vessel disease.  Small to moderate OM1 with severe diffuse disease, 50% mRCA, 95% mid small PLV, 60% PDA.  EF 60%.  Patient managed medically, no interventional options.    Lexiscan  06/2020 Lexiscan  stress is electrically negative for ischemia Myoview  scan with small anterior defect (mid) that improves in recovery. May  reflect shifting breast attenuation; cannot exclude small region of ischemia LVEF 67% with normal wall motion OVerall low risk scan.  Vas US  LE Arterial 07/2023 Summary:  Right: Mild progression is noted compared to previous study.   Diffuse atherosclerosis.  Patent proximal SFA stent without evidence of stenosis.  75-99% stenosis in the above-the-knee popliteal artery.  30-49% stenosis in the behind-and-below-the-knee popliteal artery.   Left: Mild progression is noted compared to previous study.   Diffuse atherosclerosis.  30-49% stenosis in the proximal CFA.  30-49% stenosis in the ostial SFA.  50-74% stenosis in the mid SFA, low end range  Vas Us  ABI 07/2023 Summary:  Right: Resting right ankle-brachial index indicates mild right lower  extremity arterial disease. The right toe-brachial index is normal.   Left: Resting left ankle-brachial index indicates mild left lower  extremity arterial disease. The left toe-brachial index is normal.   Physical Exam:   VS:  BP 126/64   Pulse 80   Ht 5' 3 (1.6 m)   Wt 183 lb (83 kg)   SpO2 98%   BMI 32.42 kg/m    Wt Readings from Last 3 Encounters:  09/26/23 183 lb (83 kg)  08/08/23 186 lb 12.8 oz (84.7 kg)  07/17/23 195 lb 3.2 oz (88.5 kg)    GEN: Well nourished, well developed in no acute distress while sitting in chair.  NECK: No JVD; No carotid bruits CARDIAC: RRR, no murmurs, rubs, gallops RESPIRATORY:  Clear to auscultation without rales, wheezing or rhonchi  ABDOMEN: Soft, non-tender, non-distended EXTREMITIES:  No edema; No deformity   ASSESSMENT AND PLAN: .   Coronary artery disease involving native coronary artery of native heart without angina pectoris Hyperlipidemia LDL goal <70 ETT-myoview  (12/10) with apical anterior ischemia, EF 75%.  LHC (1/11) showed diffuse disease. There was a large D3 with serial 70%, 80-90%, and 70% stenoses then severe diffuse distal vessel disease. Small to moderate OM1 with severe  diffuse disease, 50% mRCA, 95% mid small PLV, 60% PDA. EF 60%. Patient managed medically, no interventional options Lexiscan  06/2020 showed evidence of anterior wall ischemia. Recommended continuing with medical management.  Reports worsening exertional chest pain as above.  Associated with SOB, palpitations and diaphoresis. Recommended ED evaluation but patient declines due to being primary caregiver for her disabled husband.  Offered outpatient cath and patient agrees to proceed. Informed consent is discussed. Scheduled for outpatient catheterization on 10/05/2023 with Dr. Darron at 7:30 am. Recommended that patient set up care for her husband for at least 2 to 3 days depending on results of cath.  Order BMET and CBC to be completed within 30 days of cath.  Order ECHO (CP, SOB, LE Edema) Hold Farxiga  3 days prior to cath Hold metformin  24 hours before and 48 hours after cath 06/2023 LDL of 65, Triglyc of 215, AST/ALT WNL  Increase Imdur  to 90 mg daily. Continue on ASA 81 mg, Bisoprolol  2.5 mg daily, Plavix  75 mg daily, NTG prn, Crestor  40 mg   Palpitations Reports intermittent palpitations  associated with CP as above (skipping a beat, 2-3 x/week, lasting 5-10 mins) Order 2 week zio monitor  PAD (peripheral artery disease) (HCC) S/P angioplasty with stent Extensive PAD history as above  Continue with medical therapy.   Type 2 diabetes mellitus without complication, without long-term current use of insulin  (HCC) 06/2023 A1C 7.3  Managed by PCP   Essential hypertension Does not monitor Home BP. Encouraged to monitor home BP ands discussed proper BP measurement practice.  BP this OV well controlled today: 126/84 Continue on Bisoprolol  and imdur  as above.  Encourage physical activity for 150 minutes per week and heart healthy low sodium diet. Discussed limiting sodium intake to < 2 grams daily.    Informed Consent   Shared Decision Making/Informed Consent{ The risks [stroke (1 in 1000),  death (1 in 1000), kidney failure [usually temporary] (1 in 500), bleeding (1 in 200), allergic reaction [possibly serious] (1 in 200)], benefits (diagnostic support and management of coronary artery disease) and alternatives of a cardiac catheterization were discussed in detail with Ms. Salek and she is willing to proceed.  Dispo: Follow-up in 1 to 2 weeks post catheterization.   Signed, Lorette CINDERELLA Kapur, PA-C

## 2023-09-26 ENCOUNTER — Encounter: Payer: Self-pay | Admitting: Physician Assistant

## 2023-09-26 ENCOUNTER — Ambulatory Visit

## 2023-09-26 ENCOUNTER — Ambulatory Visit: Attending: Physician Assistant | Admitting: Physician Assistant

## 2023-09-26 VITALS — BP 126/64 | HR 80 | Ht 63.0 in | Wt 183.0 lb

## 2023-09-26 DIAGNOSIS — I2510 Atherosclerotic heart disease of native coronary artery without angina pectoris: Secondary | ICD-10-CM

## 2023-09-26 DIAGNOSIS — R079 Chest pain, unspecified: Secondary | ICD-10-CM

## 2023-09-26 DIAGNOSIS — R0602 Shortness of breath: Secondary | ICD-10-CM

## 2023-09-26 DIAGNOSIS — E119 Type 2 diabetes mellitus without complications: Secondary | ICD-10-CM

## 2023-09-26 DIAGNOSIS — Z95820 Peripheral vascular angioplasty status with implants and grafts: Secondary | ICD-10-CM

## 2023-09-26 DIAGNOSIS — R002 Palpitations: Secondary | ICD-10-CM

## 2023-09-26 DIAGNOSIS — E785 Hyperlipidemia, unspecified: Secondary | ICD-10-CM | POA: Diagnosis not present

## 2023-09-26 DIAGNOSIS — I10 Essential (primary) hypertension: Secondary | ICD-10-CM

## 2023-09-26 DIAGNOSIS — I739 Peripheral vascular disease, unspecified: Secondary | ICD-10-CM | POA: Diagnosis not present

## 2023-09-26 MED ORDER — ISOSORBIDE MONONITRATE ER 60 MG PO TB24: 90.0000 mg | ORAL_TABLET | Freq: Every day | ORAL | 1 refills | Status: AC

## 2023-09-26 NOTE — Patient Instructions (Signed)
 Medication Instructions:  INCREASE IMDUR  TO 90 MG (1 AND 1/2 TABLET) DAILY. HOLD FARXIGA  3 DAYS PRIOR TO PROCEDURE. DO NOT TAKE METFORMIN  24 HOURS BEFORE PROCEDURE AND HOLD 48 HOURS AFTER PROCEDURE.   Lab Work: Nutritional therapist AND CBC TO BE DONE TODAY.   Testing/Procedures: [facility] A DEPT OF [place]. [facility] [address] A DEPT OF THE [place]. [facility] [address] [place] [address] Dept: [phone] Loc: [phone]  [name]  09/26/2023  You are scheduled for a Cardiac Catheterization on Thursday, August 21 with Dr. Deatrice Cage.  1. Please arrive at the [facility] (Main Entrance A) at [facility]: [address] [place], [place] [address] at 5:30 AM (This time is 2 hour(s) before your procedure to ensure your preparation).   Free valet parking service is available. You will check in at ADMITTING. The support person will be asked to wait in the waiting room.  It is OK to have someone drop you off and come back when you are ready to be discharged.    Special note: Every effort is made to have your procedure done on time. Please understand that emergencies sometimes delay scheduled procedures.  2. Diet: No solid foods after midnight. You may have clear liquids until you arrive at the hospital.  List of approved liquids water , clear juice, clear tea, black coffee, fruit juices, non-citric and without pulp, carbonated beverages, Gatorade, Kool -Aid, plain Jello-O and plain ice popsicles.   3. Hydration: You need to be well hydrated before your procedure time. You may drink approved liquids (see below) until you arrive at the hospital. On the way to the hospital, please drink a 16-oz (1 plastic bottle) of water .   List of approved liquids water , clear juice, clear tea, black coffee, fruit juices, non-citric and without pulp, carbonated beverages, Gatorade, Kool -Aid, plain Jello-O and plain ice popsicles.   4. Labs: You  will need to have blood drawn on Tuesday, August 12 at [facility] (1st Floor), [address], [place], [place] [address]. You do not need to be fasting.  5. Medication instructions in preparation for your procedure:   Contrast Allergy: No  HOLD FARXIGA  3 DAYS PRIOR TO PROCEDURE.  DO NOT TAKE DIABETES MEDICATION GLUCOPHAGE  (METFORMIN ) 24 HOURS BEFORE PROCEDURE AND HOLD 48 HOURS AFTER PROCEDURE.  On the morning of your procedure, take your Aspirin  81 mg and any morning medicines NOT listed above.  You may use sips of water .  6. Plan to go home the same day, you will only stay overnight if medically necessary. 7. Bring a current list of your medications and current insurance cards. 8. You MUST have a responsible person to drive you home. 9. Someone MUST be with you the first 24 hours after you arrive home or your discharge will be delayed. 10. Please wear clothes that are easy to get on and off and wear slip-on shoes.  Thank you for allowing us  to care for you!   -- [facility]     Your physician has requested that you have an echocardiogram. Echocardiography is a painless test that uses sound waves to create images of your heart. It provides your doctor with information about the size and shape of your heart and how well your heart's chambers and valves are working. This procedure takes approximately one hour. There are no restrictions for this procedure.  Please do NOT wear cologne, perfume, aftershave, or lotions (deodorant is allowed). Please arrive 15 minutes prior to your appointment time.  Please note: We ask at that you not bring children with you during ultrasound (echo/ vascular) testing. Due to room size and safety concerns, children are not allowed in the ultrasound rooms during exams. Our front office staff cannot provide observation of children in our lobby area while testing is being conducted. An  adult accompanying a patient to their appointment will only be allowed in the ultrasound room at the discretion of the ultrasound technician under special circumstances. We apologize for any inconvenience.   ZIO XT- Long Term Monitor Instructions  Your physician has requested you wear a ZIO patch monitor for 14 days.  This is a single patch monitor. [employer] supplies one patch monitor per enrollment. Additional stickers are not available. Please do not apply patch if you will be having a Nuclear Stress Test,  Echocardiogram, Cardiac CT, MRI, or Chest Xray during the period you would be wearing the  monitor. The patch cannot be worn during these tests. You cannot remove and re-apply the  ZIO XT patch monitor.  Your ZIO patch monitor will be mailed 3 day USPS to your address on file. It may take 3-5 days  to receive your monitor after you have been enrolled.  Once you have received your monitor, please review the enclosed instructions. Your monitor  has already been registered assigning a specific monitor serial # to you.  Billing and Patient Assistance Program Information  We have supplied [employer] with any of your insurance information on file for billing purposes. [employer] offers a sliding scale Patient Assistance Program for patients that do not have  insurance, or whose insurance does not completely cover the cost of the ZIO monitor.  You must apply for the Patient Assistance Program to qualify for this discounted rate.  To apply, please call [employer] at [phone], select option 4, select option 2, ask to apply for  Patient Assistance Program. Meredeth will ask your household income, and how many people  are in your household. They will quote your out-of-pocket cost based on that information.  [employer] will also be able to set up a 54-month, interest-free payment plan if needed.  Applying the monitor   Shave hair from upper left chest.  Hold abrader disc by orange tab. Rub abrader in 40  strokes over the upper left chest as  indicated in your monitor instructions.  Clean area with 4 enclosed alcohol pads. Let dry.  Apply patch as indicated in monitor instructions. Patch will be placed under collarbone on left  side of chest with arrow pointing upward.  Rub patch adhesive wings for 2 minutes. Remove white label marked 1. Remove the white  label marked 2. Rub patch adhesive wings for 2 additional minutes.  While looking in a mirror, press and release button in center of patch. A small green light will  flash 3-4 times. This will be your only indicator that the monitor has been turned on.  Do not shower for the first 24 hours. You may shower after the first 24 hours.  Press the button if you feel a symptom. You will hear a small click. Record Date, Time and  Symptom in the Patient Logbook.  When you are ready to remove the patch, follow instructions on the last 2 pages of Patient  Logbook. Stick patch monitor onto the last page of Patient Logbook.  Place Patient Logbook in the blue  and white box. Use locking tab on box and tape box closed  securely. The blue and white box has prepaid postage on it. Please place it in the mailbox as  soon as possible. Your physician should have your test results approximately 7 days after the  monitor has been mailed back to [facility].  Call [facility] Customer Care at [phone] if you have questions regarding  your ZIO XT patch monitor. Call them immediately if you see an orange light blinking on your  monitor.  If your monitor falls off in less than 4 days, contact our Monitor department at [phone].  If your monitor becomes loose or falls off after 4 days call [employer] at [phone] for  suggestions on securing your monitor   Follow-Up: At [facility], you and your health needs are our priority.  As part of our continuing mission to provide you with exceptional heart care, our providers are all part of one  team.  This team includes your primary Cardiologist (physician) and Advanced Practice Providers or APPs (Physician Assistants and Nurse Practitioners) who all work together to provide you with the care you need, when you need it.  Your next appointment:   AFTER CATH  Provider:   HAO MENG, PA  We recommend signing up for the patient portal called MyChart.  Sign up information is provided on this After Visit Summary.  MyChart is used to connect with patients for Virtual Visits (Telemedicine).  Patients are able to view lab/test results, encounter notes, upcoming appointments, etc.  Non-urgent messages can be sent to your provider as well.   To learn more about what you can do with MyChart, go to [email or web address].

## 2023-09-26 NOTE — Progress Notes (Unsigned)
Enrolled for [employer] to mail a ZIO XT long term holter monitor to the patients address on file.   Dr. Fletcher Anon to read.

## 2023-09-27 ENCOUNTER — Other Ambulatory Visit: Payer: Self-pay | Admitting: Physician Assistant

## 2023-09-27 LAB — BASIC METABOLIC PANEL WITH GFR
BUN/Creatinine Ratio: 29 — ABNORMAL HIGH (ref 12–28)
BUN: 28 mg/dL — ABNORMAL HIGH (ref 8–27)
CO2: 21 mmol/L (ref 20–29)
Calcium: 10.4 mg/dL — ABNORMAL HIGH (ref 8.7–10.3)
Chloride: 103 mmol/L (ref 96–106)
Creatinine, Ser: 0.98 mg/dL (ref 0.57–1.00)
Glucose: 111 mg/dL — ABNORMAL HIGH (ref 70–99)
Potassium: 4.6 mmol/L (ref 3.5–5.2)
Sodium: 143 mmol/L (ref 134–144)
eGFR: 64 mL/min/1.73 (ref >59)

## 2023-09-27 LAB — CBC
Hematocrit: 40 % (ref 34.0–46.6)
Hemoglobin: 12.4 g/dL (ref 11.1–15.9)
MCH: 27.4 pg (ref 26.6–33.0)
MCHC: 31 g/dL — ABNORMAL LOW (ref 31.5–35.7)
MCV: 89 fL (ref 79–97)
Platelets: 271 x10E3/uL (ref 150–450)
RBC: 4.52 x10E6/uL (ref 3.77–5.28)
RDW: 14 % (ref 11.7–15.4)
WBC: 5.4 x10E3/uL (ref 3.4–10.8)

## 2023-10-03 ENCOUNTER — Telehealth: Payer: Self-pay | Admitting: *Deleted

## 2023-10-03 NOTE — Telephone Encounter (Signed)
 Cardiac Catheterization scheduled at [facility] for: Thursday October 05, 2023 7:30 AM Arrival time [facility] Main Entrance A at: 5:30 AM  Diet: -Nothing to eat after midnight prior to procedure.  Hydration: -May drink clear liquids until leaving for hospital. Approved liquids: Water , clear tea, black coffee, fruit juices-non-citric and without pulp,Gatorade, plain Jello/popsicles.  Drink 16 oz. bottle of water  on the way to the hospital.   Medication instructions: -Hold:  Metformin -day of procedure and 48 hours post procedure  Farxiga /Actos /Rybelsus -AM of procedure -Other usual morning medications can be taken including aspirin  81 mg.  Plan to go home the same day, you will only stay overnight if medically necessary.  You must have responsible adult to drive you home.  Someone must be with you the first 24 hours after you arrive home.  Reviewed procedure instructions with patient.

## 2023-10-05 ENCOUNTER — Other Ambulatory Visit: Payer: Self-pay

## 2023-10-05 ENCOUNTER — Encounter (HOSPITAL_COMMUNITY)
Admission: RE | Disposition: A | Discharge status: Home / Self Care | Payer: Self-pay | Source: Home / Self Care | Attending: Cardiovascular Disease

## 2023-10-05 ENCOUNTER — Ambulatory Visit (HOSPITAL_COMMUNITY)
Admission: RE | Admit: 2023-10-05 | Discharge: 2023-10-05 | Disposition: A | Discharge status: Home / Self Care | Attending: Cardiovascular Disease | Admitting: Cardiovascular Disease

## 2023-10-05 DIAGNOSIS — I10 Essential (primary) hypertension: Secondary | ICD-10-CM | POA: Insufficient documentation

## 2023-10-05 DIAGNOSIS — G8929 Other chronic pain: Secondary | ICD-10-CM | POA: Diagnosis not present

## 2023-10-05 DIAGNOSIS — Z95820 Peripheral vascular angioplasty status with implants and grafts: Secondary | ICD-10-CM | POA: Insufficient documentation

## 2023-10-05 DIAGNOSIS — I25118 Atherosclerotic heart disease of native coronary artery with other forms of angina pectoris: Secondary | ICD-10-CM | POA: Diagnosis not present

## 2023-10-05 DIAGNOSIS — Z7902 Long term (current) use of antithrombotics/antiplatelets: Secondary | ICD-10-CM | POA: Insufficient documentation

## 2023-10-05 DIAGNOSIS — Z7982 Long term (current) use of aspirin: Secondary | ICD-10-CM | POA: Insufficient documentation

## 2023-10-05 DIAGNOSIS — Z87891 Personal history of nicotine dependence: Secondary | ICD-10-CM | POA: Diagnosis not present

## 2023-10-05 DIAGNOSIS — I2584 Coronary atherosclerosis due to calcified coronary lesion: Secondary | ICD-10-CM | POA: Insufficient documentation

## 2023-10-05 DIAGNOSIS — E785 Hyperlipidemia, unspecified: Secondary | ICD-10-CM | POA: Diagnosis not present

## 2023-10-05 DIAGNOSIS — E1151 Type 2 diabetes mellitus with diabetic peripheral angiopathy without gangrene: Secondary | ICD-10-CM | POA: Diagnosis not present

## 2023-10-05 DIAGNOSIS — Z79899 Other long term (current) drug therapy: Secondary | ICD-10-CM | POA: Insufficient documentation

## 2023-10-05 DIAGNOSIS — R002 Palpitations: Secondary | ICD-10-CM | POA: Diagnosis not present

## 2023-10-05 DIAGNOSIS — I2510 Atherosclerotic heart disease of native coronary artery without angina pectoris: Secondary | ICD-10-CM

## 2023-10-05 HISTORY — PX: LEFT HEART CATH AND CORONARY ANGIOGRAPHY: CATH118249

## 2023-10-05 LAB — GLUCOSE, CAPILLARY
Glucose-Capillary: 123 mg/dL — ABNORMAL HIGH (ref 70–99)
Glucose-Capillary: 129 mg/dL — ABNORMAL HIGH (ref 70–99)

## 2023-10-05 SURGERY — LEFT HEART CATH AND CORONARY ANGIOGRAPHY
Anesthesia: LOCAL

## 2023-10-05 MED ORDER — MIDAZOLAM HCL 2 MG/2ML IJ SOLN
INTRAMUSCULAR | Status: AC
  Filled 2023-10-05: qty 2

## 2023-10-05 MED ORDER — ASPIRIN 81 MG PO CHEW: 81.0000 mg | CHEWABLE_TABLET | ORAL | Status: DC

## 2023-10-05 MED ORDER — SODIUM CHLORIDE 0.9 % IV SOLN
INTRAVENOUS | Status: DC | PRN
  Administered 2023-10-05: 10 mL/h via INTRAVENOUS

## 2023-10-05 MED ORDER — SODIUM CHLORIDE 0.9% FLUSH: 3.0000 mL | INTRAVENOUS | Status: DC | PRN

## 2023-10-05 MED ORDER — FREE WATER
500.0000 mL | Freq: Once | Status: AC
  Administered 2023-10-05: 500 mL via ORAL

## 2023-10-05 MED ORDER — HEPARIN SODIUM (PORCINE) 1000 UNIT/ML IJ SOLN
INTRAMUSCULAR | Status: DC | PRN
  Administered 2023-10-05: 4000 [IU] via INTRAVENOUS

## 2023-10-05 MED ORDER — LIDOCAINE HCL (PF) 1 % IJ SOLN
INTRAMUSCULAR | Status: DC | PRN
  Administered 2023-10-05: 2 mL

## 2023-10-05 MED ORDER — HEPARIN (PORCINE) IN NACL 1000-0.9 UT/500ML-% IV SOLN
INTRAVENOUS | Status: DC | PRN
  Administered 2023-10-05: 1000 mL

## 2023-10-05 MED ORDER — VERAPAMIL HCL 2.5 MG/ML IV SOLN
INTRAVENOUS | Status: AC
  Filled 2023-10-05: qty 2

## 2023-10-05 MED ORDER — SODIUM CHLORIDE 0.9% FLUSH: 3.0000 mL | Freq: Two times a day (BID) | INTRAVENOUS | Status: DC

## 2023-10-05 MED ORDER — ACETAMINOPHEN 325 MG PO TABS: 650.0000 mg | ORAL_TABLET | ORAL | Status: DC | PRN

## 2023-10-05 MED ORDER — SODIUM CHLORIDE 0.9 % IV SOLN: 250.0000 mL | INTRAVENOUS | Status: DC | PRN

## 2023-10-05 MED ORDER — FENTANYL CITRATE (PF) 100 MCG/2ML IJ SOLN
INTRAMUSCULAR | Status: AC
  Filled 2023-10-05: qty 2

## 2023-10-05 MED ORDER — SODIUM CHLORIDE 0.9% FLUSH
3.0000 mL | INTRAVENOUS | Status: DC | PRN
Start: 2023-10-05 — End: 2023-10-05

## 2023-10-05 MED ORDER — LIDOCAINE HCL (PF) 1 % IJ SOLN
INTRAMUSCULAR | Status: AC
  Filled 2023-10-05: qty 30

## 2023-10-05 MED ORDER — IOHEXOL 350 MG/ML SOLN
INTRAVENOUS | Status: DC | PRN
  Administered 2023-10-05: 46 mL

## 2023-10-05 MED ORDER — ONDANSETRON HCL 4 MG/2ML IJ SOLN: 4.0000 mg | Freq: Four times a day (QID) | INTRAMUSCULAR | Status: DC | PRN

## 2023-10-05 MED ORDER — FREE WATER: 500.0000 mL | Freq: Once | Status: DC

## 2023-10-05 MED ORDER — VERAPAMIL HCL 2.5 MG/ML IV SOLN
INTRAVENOUS | Status: DC | PRN
  Administered 2023-10-05: 10 mL via INTRA_ARTERIAL

## 2023-10-05 MED ORDER — MIDAZOLAM HCL 2 MG/2ML IJ SOLN
INTRAMUSCULAR | Status: DC | PRN
  Administered 2023-10-05: 1 mg via INTRAVENOUS

## 2023-10-05 MED ORDER — FENTANYL CITRATE (PF) 100 MCG/2ML IJ SOLN
INTRAMUSCULAR | Status: DC | PRN
  Administered 2023-10-05: 25 ug via INTRAVENOUS

## 2023-10-05 SURGICAL SUPPLY — 6 items
CATH INFINITI AMBI 5FR JK (CATHETERS) IMPLANT
DEVICE RAD COMP TR BAND LRG (VASCULAR PRODUCTS) IMPLANT
GLIDESHEATH SLEND SS 6F .021 (SHEATH) IMPLANT
GUIDEWIRE INQWIRE 1.5J.035X260 (WIRE) IMPLANT
PACK CARDIAC CATHETERIZATION (CUSTOM PROCEDURE TRAY) ×1 IMPLANT
SET ATX-X65L (MISCELLANEOUS) IMPLANT

## 2023-10-05 NOTE — Discharge Instructions (Signed)
 NO METFORMIN  FOR 2 DAYS

## 2023-10-05 NOTE — Interval H&P Note (Signed)
 History and Physical Interval Note:  10/05/2023 7:44 AM  [name]  has presented today for surgery, with the diagnosis of chest pain.  The various methods of treatment have been discussed with the patient and family. After consideration of risks, benefits and other options for treatment, the patient has consented to  Procedure(s): LEFT HEART CATH AND CORONARY ANGIOGRAPHY (N/A) as a surgical intervention.  The patient's history has been reviewed, patient examined, no change in status, stable for surgery.  I have reviewed the patient's chart and labs.  Questions were answered to the patient's satisfaction.     [name]

## 2023-10-06 ENCOUNTER — Encounter (HOSPITAL_COMMUNITY): Payer: Self-pay | Admitting: Cardiovascular Disease

## 2023-10-12 ENCOUNTER — Other Ambulatory Visit: Payer: Self-pay | Admitting: Family Medicine

## 2023-10-12 ENCOUNTER — Ambulatory Visit: Payer: Self-pay

## 2023-10-12 MED ORDER — ONDANSETRON HCL 4 MG PO TABS: 4.0000 mg | ORAL_TABLET | Freq: Three times a day (TID) | ORAL | 0 refills | Status: DC | PRN

## 2023-10-12 NOTE — Telephone Encounter (Signed)
 Called [facility] to inform them of patient's E.R. Disposition and refusal of the E.R. at this time.

## 2023-10-12 NOTE — Telephone Encounter (Signed)
 FYI Only or Action Required?: Action required by provider: update on patient condition.  Patient was last seen in primary care on 07/17/2023 by [name] DASEN, [name].  Called Nurse Triage reporting Vomiting.  Symptoms began a week ago.  Interventions attempted: Rest, hydration, or home remedies.  Symptoms are: rapidly worsening.  Triage Disposition: Go to ED Now (or PCP Triage)  Patient/caregiver understands and will follow disposition?: No, wishes to speak with PCP                  Copied from CRM #[id number]. Topic: Clinical - Red Word Triage >> Oct 12, 2023 10:09 AM [name] wrote: Red Word that prompted transfer to Nurse Triage: Patient is throwing up cannot keep anything down, light headed, dizzy and needs to be seen please. Reason for Disposition  [1] SEVERE vomiting (e.g., 6 or more times/day) AND [2] present > 8 hours (Exception: Patient sounds well, is drinking liquids, does not sound dehydrated, and vomiting has lasted less than 24 hours.)  Answer Assessment - Initial Assessment Questions Patient's husband called for the patient and she was sitting there with him Patient has been vomiting and having diarrhea since she left the hospital a week ago---patient had a Left Heart Cath & Coronary Angiography procedure done and has been sick ever since. Husband states her blood sugar was also around 200 this morning Given this major procedure recently, patient's medical history, and the fact that the patient has been vomiting every hour on the hour the husband and patient are advised that the recommendation is the Emergency Room at this time. Patient refused the Emergency Room & patient's husband states that neither of them will go to the Emergency Room They just wanted a message sent to Dr Leslie about this    1. VOMITING SEVERITY: How many times have you vomited in the past 24 hours?      Husband states every hour on the hour 2. ONSET: When did the vomiting  begin?      After leaving hospital last week after heart cath  3. FLUIDS: What fluids or food have you vomited up today? Have you been able to keep any fluids down?     Maybe a popsicle  4. ABDOMEN PAIN: Are your having any abdomen pain? If Yes : How bad is it and what does it feel like? (e.g., crampy, dull, intermittent, constant)      Denies 5. DIARRHEA: Is there any diarrhea? If Yes, ask: How many times today?      Yes---unknown exactly how much 6. CONTACTS: Is there anyone else in the family with the same symptoms?      ---- 7. CAUSE: What do you think is causing your vomiting?     unknown 8. HYDRATION STATUS: Any signs of dehydration? (e.g., dry mouth [not only dry lips], too weak to stand) When did you last urinate?     Unable to keep anything in her system---maybe a popsicle earlier 9. OTHER SYMPTOMS: Do you have any other symptoms? (e.g., fever, headache, vertigo, vomiting blood or coffee grounds, recent head injury)     Denies throwing up blood, diarrhea, generalized malaise  Protocols used: Vomiting-A-AH

## 2023-10-13 ENCOUNTER — Ambulatory Visit (INDEPENDENT_AMBULATORY_CARE_PROVIDER_SITE_OTHER): Admitting: Family Medicine

## 2023-10-13 ENCOUNTER — Encounter: Payer: Self-pay | Admitting: Family Medicine

## 2023-10-13 VITALS — BP 112/56 | HR 93 | Temp 97.6°F | Ht 62.5 in | Wt 164.5 lb

## 2023-10-13 DIAGNOSIS — E860 Dehydration: Secondary | ICD-10-CM | POA: Diagnosis not present

## 2023-10-13 DIAGNOSIS — R112 Nausea with vomiting, unspecified: Secondary | ICD-10-CM | POA: Diagnosis not present

## 2023-10-13 DIAGNOSIS — K529 Noninfective gastroenteritis and colitis, unspecified: Secondary | ICD-10-CM | POA: Diagnosis not present

## 2023-10-13 LAB — INFLUENZA A AND B AG, IMMUNOASSAY
INFLUENZA A ANTIGEN: NOT DETECTED
INFLUENZA B ANTIGEN: NOT DETECTED

## 2023-10-13 MED ORDER — DIPHENOXYLATE-ATROPINE 2.5-0.025 MG PO TABS: 2.0000 | ORAL_TABLET | Freq: Four times a day (QID) | ORAL | 0 refills | Status: AC | PRN

## 2023-10-13 MED ORDER — ONDANSETRON HCL 4 MG PO TABS: 4.0000 mg | ORAL_TABLET | Freq: Three times a day (TID) | ORAL | 0 refills | Status: AC | PRN

## 2023-10-13 NOTE — Progress Notes (Signed)
 Subjective:    Patient ID: [name], female    DOB: March 20, 1955, 68 y.o.   MRN: [id number]  Patient is a 68 year old female with a history of coronary artery disease as well as type 2 diabetes mellitus and chronic pancreatic insufficiency.  Patient was started on Rybelsus  3 mg daily earlier this summer for her diabetes.  Patient had a cardiac cath on August 21 which showed nonobstructive coronary disease.  Beginning August 23, she developed sore throat, severe nausea and vomiting.  The patient states that she has not been able to eat for 6 days.  She has been unable to keep anything down.  She has been running subjective fevers.  She called yesterday and I called out some Zofran .  She states that the Zofran  has stopped the nausea and vomiting however she continues to have copious watery diarrhea.  She states that she has very little urine output.  Her tongue appears dehydrated.  She reports feeling weak and tired.  She denies any abdominal pain or bloody stool. Past Medical History:  Diagnosis Date   Aortic atherosclerosis (HCC)    Asthma    CAD (coronary artery disease)    a. LHC 2011: Diffuse distal and branch vessel disease - patient managed medically, no interventional options. b. Nuc 03/2014 - low risk, no ischemia.   Complication of anesthesia    slow to wake up with last surgery in 2011    Diabetes mellitus with circulatory complication (HCC)    Diverticulosis    Dyslipidemia    GERD (gastroesophageal reflux disease)    Headache    hx of migraines    Hypertension    Osteoarthritis    severe right knee  R TKR   PAD (peripheral artery disease) (HCC)    a. Severe stenosis mid right SFA s/p atherectomy 03/25/09. b. peripheral angiography in 12/2011 which showed only about 50% diffuse right SFA stenosis.   Pancreatitis 2003   Renal artery stenosis (HCC)    a. LHC 2011 - 40-50% left RAS.   Tobacco use disorder    quit 11/10   Past Surgical History:  Procedure Laterality Date    ABDOMINAL AORTAGRAM N/A 12/21/2011   Procedure: ABDOMINAL AORTAGRAM;  Surgeon: Deatrice DELENA Cage, MD;  Location: [facility];  Service: Cardiovascular;  Laterality: N/A;   ABDOMINAL AORTOGRAM W/LOWER EXTREMITY Bilateral 05/08/2019   Procedure: ABDOMINAL AORTOGRAM W/LOWER EXTREMITY;  Surgeon: Cage Deatrice DELENA, MD;  [facility]: [facility];  Service: Cardiovascular;  Laterality: Bilateral;   ABDOMINAL AORTOGRAM W/LOWER EXTREMITY N/A 09/28/2022   Procedure: ABDOMINAL AORTOGRAM W/LOWER EXTREMITY;  Surgeon: Cage Deatrice DELENA, MD;  [facility]: [facility];  Service: Cardiovascular;  Laterality: N/A;   CARDIAC CATHETERIZATION     ENDARTERECTOMY FEMORAL Right 10/10/2019   Procedure: RIGHT FEMORAL ENDARTERECTOMY;  Surgeon: Gretta Lonni PARAS, MD;  [facility];  Service: Vascular;  Laterality: Right;   ESOPHAGOGASTRODUODENOSCOPY (EGD) WITH PROPOFOL  N/A 10/05/2017   Procedure: ESOPHAGOGASTRODUODENOSCOPY (EGD) WITH PROPOFOL ;  Surgeon: Teressa Toribio SQUIBB, MD;  Location: [facility];  Service: Endoscopy;  Laterality: N/A;   EUS N/A 10/05/2017   Procedure: UPPER ENDOSCOPIC ULTRASOUND (EUS) RADIAL;  Surgeon: Teressa Toribio SQUIBB, MD;  Location: [facility];  Service: Endoscopy;  Laterality: N/A;   FOOT SURGERY Left    LEFT HEART CATH AND CORONARY ANGIOGRAPHY N/A 10/05/2023   Procedure: LEFT HEART CATH AND CORONARY ANGIOGRAPHY;  Surgeon: Cage Deatrice DELENA, MD;  [facility];  Service: Cardiovascular;  Laterality: N/A;  LOWER EXTREMITY ANGIOGRAM Bilateral 05/27/2015   Procedure: Lower Extremity Angiogram;  Surgeon: Deatrice DELENA Cage, MD;  [facility];  Service: Cardiovascular;  Laterality: Bilateral;   PATCH ANGIOPLASTY Right 10/10/2019   Procedure: PATCH ANGIOPLASTY USING GEORGE BIOLOGIC PATCH;  Surgeon: Gretta Lonni PARAS, MD;  Location: [facility] OR;  Service: Vascular;  Laterality: Right;   PERIPHERAL INTRAVASCULAR LITHOTRIPSY  09/28/2022   Procedure: PERIPHERAL  INTRAVASCULAR LITHOTRIPSY;  Surgeon: Cage Deatrice DELENA, MD;  [facility];  Service: Cardiovascular;;   PERIPHERAL VASCULAR ATHERECTOMY Left 05/08/2019   Procedure: PERIPHERAL VASCULAR ATHERECTOMY;  Surgeon: Cage Deatrice DELENA, MD;  [facility];  Service: Cardiovascular;  Laterality: Left;   PERIPHERAL VASCULAR CATHETERIZATION N/A 05/27/2015   Procedure: Abdominal Aortogram;  Surgeon: Deatrice DELENA Cage, MD;  [facility];  Service: Cardiovascular;  Laterality: N/A;   PERIPHERAL VASCULAR CATHETERIZATION Right 05/27/2015   Procedure: Peripheral Vascular Intervention;  Surgeon: Deatrice DELENA Cage, MD;  [facility];  Service: Cardiovascular;  Laterality: Right;  SFA   PERIPHERAL VASCULAR INTERVENTION  09/28/2022   Procedure: PERIPHERAL VASCULAR INTERVENTION;  Surgeon: Cage Deatrice DELENA, MD;  [facility];  Service: Cardiovascular;;   TOTAL KNEE ARTHROPLASTY Right 2011   TOTAL KNEE ARTHROPLASTY Left 05/22/2014   Procedure: LEFT TOTAL KNEE ARTHROPLASTY;  Surgeon: Tanda Heading, MD;  Location: [facility];  Service: Orthopedics;  Laterality: Left;   TRIGGER FINGER RELEASE Left 07/07/2021   Procedure: LEFT INDEX FINGER RELEASE TRIGGER FINGER/A-1 PULLEY / CARPOMETACARPAL INJECTION;  Surgeon: Romona Harari, MD;  Location: [facility];  Service: Orthopedics;  Laterality: Left;   TUBAL LIGATION  1980   Current Outpatient Medications on File Prior to Visit  Medication Sig Dispense Refill   acetaminophen  (TYLENOL ) 500 MG tablet Take 1,000 mg by mouth every 6 (six) hours as needed for moderate pain or headache.     albuterol  (VENTOLIN  HFA) 108 (90 Base) MCG/ACT inhaler Inhale 2 puffs into the lungs every 6 (six) hours as needed for wheezing or shortness of breath. 8.5 each 3   aspirin  EC 81 MG tablet Take 81 mg by mouth daily. Swallow whole.     bisoprolol  (ZEBETA ) 5 MG tablet TAKE 1/2 TABLET(2.5 MG) BY MOUTH DAILY 45 tablet 1    Calcium  Polycarbophil (FIBER-CAPS PO) Take 1 capsule by mouth daily.     clopidogrel  (PLAVIX ) 75 MG tablet TAKE 1 TABLET(75 MG) BY MOUTH DAILY 90 tablet 1   COLLAGEN PO Take 1,000 mg by mouth daily.     Cyanocobalamin (VITAMIN B-12) 5000 MCG TBDP Take 5,000 mcg by mouth daily.     diclofenac  sodium (VOLTAREN ) 1 % GEL Apply 2 g topically 4 (four) times daily. (Patient taking differently: Apply 2 g topically daily as needed (pain).) 100 g 0   FARXIGA  10 MG TABS tablet TAKE 1 TABLET BY MOUTH EVERY DAY 90 tablet 0   fluticasone -salmeterol (ADVAIR  DISKUS) 250-50 MCG/ACT AEPB Inhale 1 puff into the lungs in the morning and at bedtime. 60 each 11   Homeopathic Products (THERAWORX MUSCLE CRAMPS) FOAM Apply 1 application  topically daily as needed (Muscle cramps).     HYDROcodone -acetaminophen  (NORCO/VICODIN) 5-325 MG tablet Take 1 tablet by mouth every 6 (six) hours as needed for moderate pain (pain score 4-6). Patient needs to schedule office visit 130 tablet 0   isosorbide  mononitrate (IMDUR ) 60 MG 24 hr tablet Take 1.5 tablets (90 mg total) by mouth daily. 135 tablet 1  lipase/protease/amylase (CREON ) 36000 UNITS CPEP capsule Take 3 capsules (108,000 Units total) by mouth 3 (three) times daily with meals. May also take 1 capsule (36,000 Units total) as needed (with snacks). 200 capsule 3   loratadine (CLARITIN) 10 MG tablet Take 10 mg by mouth daily.     metFORMIN  (GLUCOPHAGE ) 1000 MG tablet Take 1 tablet (1,000 mg total) by mouth 2 (two) times daily with a meal. 180 tablet 2   methocarbamol  (ROBAXIN ) 500 MG tablet TAKE 1 TABLET BY MOUTH EVERY 6 HOURS AS NEEDED FOR MUSCLE SPASM 120 tablet 3   Multiple Vitamin (MULTIVITAMIN) tablet Take 1 tablet by mouth daily.     nitroGLYCERIN  (NITROSTAT ) 0.4 MG SL tablet Place 1 tablet under the tongue every 5 (five) minutes as needed for chest pain.     ondansetron  (ZOFRAN ) 4 MG tablet Take 1 tablet (4 mg total) by mouth every 8 (eight) hours as needed for nausea  or vomiting. 20 tablet 0   ONETOUCH ULTRA TEST test strip USE TO CHECK BLOOD SUGARS UP TO 4 TIMES PER DAY. 100 strip 1   Oxymetazoline HCl (NASAL SPRAY NA) Place 1 spray into the nose daily as needed (Congestion/allergies).     pantoprazole  (PROTONIX ) 40 MG tablet Take 1 tablet (40 mg total) by mouth daily. 90 tablet 1   pioglitazone  (ACTOS ) 30 MG tablet TAKE 1 TABLET BY MOUTH EVERY DAY 90 tablet 1   pregabalin  (LYRICA ) 50 MG capsule Take 1 capsule (50 mg total) by mouth 3 (three) times daily. 90 capsule 2   Probiotic Product (TRUBIOTICS PO) Take 1 capsule by mouth daily.     promethazine  (PHENERGAN ) 25 MG tablet TAKE 1 TABLET(25 MG) BY MOUTH EVERY 8 HOURS AS NEEDED FOR NAUSEA OR VOMITING 30 tablet 2   rosuvastatin  (CRESTOR ) 40 MG tablet TAKE 1 TABLET(40 MG) BY MOUTH EVERY NIGHT 90 tablet 1   Semaglutide  (RYBELSUS ) 3 MG TABS Take 1 tablet (3 mg total) by mouth daily. 30 tablet 2   No current facility-administered medications on file prior to visit.   Allergies  Allergen Reactions   Gabapentin  Other (See Comments)    Brain shakes   Glucotrol [Glipizide] Rash and Other (See Comments)    Red rash   Influenza Vaccines Other (See Comments)    Significant arm soreness requiring 1 year of physical therapy   Latex Rash   Nortriptyline  Other (See Comments)    BRAIN SHAKE   Tetanus Toxoid Hives, Swelling, Rash and Other (See Comments)    Swelling to site of injection with rash and fever   Other Itching, Other (See Comments) and Cough    Patient is highly allergic to Cats and she begins to break out in rash and turns red if someone caring for her has cats at home.  Patient takes Claritin for this type of reaction.  Patient stated that if it goes untreated then he begins to have difficulty breathing   Prednisone  & Diphenhydramine     Pt get really headaches   Invokana  [Canagliflozin ] Other (See Comments)    Yeast Infections   Social History   Socioeconomic History   Marital status: Married     Spouse name: Not on file   Number of children: Not on file   Years of education: 10   Highest education level: Not on file  Occupational History   Not on file  Tobacco Use   Smoking status: Former    Current packs/day: 0.00    Types: Cigarettes  Quit date: 12/13/2008    Years since quitting: 14.8   Smokeless tobacco: Never  Vaping Use   Vaping status: Never Used  Substance and Sexual Activity   Alcohol use: No    Comment: Quit drinking around year 2000   Drug use: No    Comment: Patient quit smoking around 2011   Sexual activity: Yes    Partners: Male    Birth control/protection: Post-menopausal  Other Topics Concern   Not on file  Social History Narrative   Denies any IV drug use or marijuana use.  Former smoker for 30 years, quit Nov. 2010. She is married with 2 children. Disabled from knee, not working.   Social Drivers of Health   Financial Resource Strain: Medium Risk (09/28/2018)   Overall Financial Resource Strain (CARDIA)    Difficulty of Paying Living Expenses: Somewhat hard  Food Insecurity: Not on file  Transportation Needs: No Transportation Needs (09/28/2018)   PRAPARE - Administrator, Civil Service (Medical): No    Lack of Transportation (Non-Medical): No  Physical Activity: Not on file  Stress: Not on file  Social Connections: Unknown (09/28/2018)   Social Connection and Isolation Panel    Frequency of Communication with Friends and Family: Once a week    Frequency of Social Gatherings with Friends and Family: Once a week    Attends Religious Services: Not on Marketing executive or Organizations: Not on file    Attends Banker Meetings: Not on file    Marital Status: Married  Intimate Partner Violence: Not At Risk (09/28/2018)   Humiliation, Afraid, Rape, and Kick questionnaire    Fear of Current or Ex-Partner: No    Emotionally Abused: No    Physically Abused: No    Sexually Abused: No   Family History  Problem  Relation Age of Onset   CVA Mother    Emphysema Father    Breast cancer Sister 78   Colon cancer Neg Hx      Review of Systems  All other systems reviewed and are negative.      Objective:   Physical Exam Vitals reviewed.  Constitutional:      General: She is not in acute distress.    Appearance: She is well-developed. She is not diaphoretic.  HENT:     Mouth/Throat:     Mouth: Mucous membranes are dry.  Neck:     Vascular: No JVD.  Cardiovascular:     Rate and Rhythm: Normal rate and regular rhythm.     Heart sounds: Normal heart sounds. No murmur heard. Pulmonary:     Effort: Pulmonary effort is normal.     Breath sounds: No wheezing, rhonchi or rales.  Chest:     Chest wall: No crepitus.  Abdominal:     General: There is no distension.     Palpations: Abdomen is soft.     Tenderness: There is no abdominal tenderness. There is no guarding or rebound.         Assessment & Plan:  Nausea and vomiting, unspecified vomiting type - Plan: Influenza A and B Ag, Immunoassay  Gastroenteritis  Dehydration I believe the patient has severe viral gastroenteritis.  I am also concerned that she has dehydration.  Recommended Zofran  4 mg every 8 hours as needed for nausea and vomiting.  Due to the severity of the dehydration recommended trying Lomotil  1 to 2 tablets every 6 hours as needed for diarrhea.  Instructed  the patient to discontinue Farxiga , metformin , and Rybelsus  until she her symptoms are better to avoid dehydration nausea and lactic acidosis.  Push fluids such as Gatorade.  If she is unable to drink and does not have sufficient urine output I recommended she go to the emergency room for IV fluid.  Suspect that she likely has renal insufficiency but hopefully we can treat this with aggressive oral fluid repletion

## 2023-10-17 ENCOUNTER — Other Ambulatory Visit: Payer: Self-pay | Admitting: Family Medicine

## 2023-10-17 NOTE — Telephone Encounter (Unsigned)
 Copied from CRM [id number]. Topic: Clinical - Medication Refill >> Oct 17, 2023  8:37 AM Rosina BIRCH wrote: Medication: HYDROcodone -acetaminophen  (NORCO/VICODIN) 5-325 MG tablet  Has the patient contacted their pharmacy? No (Agent: If no, request that the patient contact the pharmacy for the refill. If patient does not wish to contact the pharmacy document the reason why and proceed with request.) (Agent: If yes, when and what did the pharmacy advise?)  This is the patient's preferred pharmacy:  [id number] [place], [place] - [address] [place] [address] Phone: [phone] Fax: [phone]  Is this the correct pharmacy for this prescription? Yes If no, delete pharmacy and type the correct one.   Has the prescription been filled recently? Yes  Is the patient out of the medication? Yes  Has the patient been seen for an appointment in the last year OR does the patient have an upcoming appointment? Yes  Can we respond through MyChart? No  Agent: Please be advised that Rx refills may take up to 3 business days. We ask that you follow-up with your pharmacy.

## 2023-10-17 NOTE — Telephone Encounter (Signed)
 Requested medication (s) are due for refill today: yes  Requested medication (s) are on the active medication list: yes  Last refill:  09/07/23  Future visit scheduled: yes  Notes to clinic:  Unable to refill per protocol, cannot delegate.      Requested Prescriptions  Pending Prescriptions Disp Refills   HYDROcodone -acetaminophen  (NORCO/VICODIN) 5-325 MG tablet 130 tablet 0    Sig: Take 1 tablet by mouth every 6 (six) hours as needed for moderate pain (pain score 4-6). Patient needs to schedule office visit     Not Delegated - Analgesics:  Opioid Agonist Combinations Failed - 10/17/2023  3:57 PM      Failed - This refill cannot be delegated      Failed - Urine Drug Screen completed in last 360 days      Passed - Valid encounter within last 3 months    Recent Outpatient Visits           4 days ago Nausea and vomiting, unspecified vomiting type   [place] [facility] Duanne Butler DASEN, MD   3 months ago Type 2 diabetes mellitus with other circulatory complication, without long-term current use of insulin  [facility])   [place] [facility] Duanne Butler DASEN, MD   6 months ago Type 2 diabetes mellitus with other circulatory complication, without long-term current use of insulin  [facility])   [place] [facility] Duanne Butler DASEN, MD   6 months ago Acute cough   [place] [facility] Duanne, Butler DASEN, MD   11 months ago Visit for suture removal   [place] [facility] Pickard, Butler DASEN, MD       Future Appointments             In 3 weeks Meng, Hao, [place] [facility] at [employer] [facility] [employer]. [facility] [employer], [employer]

## 2023-10-18 ENCOUNTER — Telehealth: Payer: Self-pay

## 2023-10-18 ENCOUNTER — Other Ambulatory Visit (HOSPITAL_COMMUNITY): Payer: Self-pay

## 2023-10-18 NOTE — Telephone Encounter (Signed)
 Copied from CRM [id number]. Topic: Clinical - Prescription Issue >> Oct 18, 2023  4:17 PM Kevelyn M wrote: Reason for CRM: Patient is calling in for status for HYDROcodone -acetaminophen  (NORCO/VICODIN) 5-325 MG tablet

## 2023-10-19 ENCOUNTER — Other Ambulatory Visit (HOSPITAL_COMMUNITY): Payer: Self-pay

## 2023-10-19 ENCOUNTER — Telehealth: Payer: Self-pay | Admitting: Pharmacy Technician

## 2023-10-19 MED ORDER — HYDROCODONE-ACETAMINOPHEN 5-325 MG PO TABS: 1.0000 | ORAL_TABLET | Freq: Four times a day (QID) | ORAL | 0 refills | Status: DC | PRN

## 2023-10-19 NOTE — Telephone Encounter (Signed)
 Pharmacy Patient Advocate Encounter   Received notification from CoverMyMeds that prior authorization for Ondansetron  HCl 4MG  tablets is required/requested.   Insurance verification completed.   The patient is insured through [facility] ADVANTAGE/RX ADVANCE .   Per test claim: PA required and submitted KEY/EOC/Request #: [id number].  Full denial letter will be uploaded to the media tab. See denial reason below.

## 2023-10-19 NOTE — Telephone Encounter (Signed)
 Pt informed that medication was sent this morning.

## 2023-10-20 ENCOUNTER — Telehealth: Payer: Self-pay

## 2023-10-20 ENCOUNTER — Ambulatory Visit: Payer: Self-pay | Admitting: *Deleted

## 2023-10-20 NOTE — Telephone Encounter (Signed)
 FYI Only or Action Required?: Action required by provider: clinical question for provider.  Patient was last seen in primary care on 10/13/2023 by Duanne Butler DASEN, MD.  Called Nurse Triage reporting Leg Swelling.  Symptoms began several days ago.  Interventions attempted: Rest, hydration, or home remedies.  Symptoms are: gradually worsening.  Triage Disposition: See Physician Within 24 Hours  Patient/caregiver understands and will follow disposition?: No, refuses disposition    Reason for Disposition  [1] MODERATE leg swelling (e.g., swelling extends up to knees) AND [2] new-onset or getting worse  Answer Assessment - Initial Assessment Questions 1. ONSET: When did the swelling start? (e.g., minutes, hours, days)     3-4 days 2. LOCATION: What part of the leg is swollen?  Are both legs swollen or just one leg?     Swelling- bilateral- PAD hx 3. SEVERITY: How bad is the swelling? (e.g., localized; mild, moderate, severe)     Below the knee- feet/calve 4. REDNESS: Is there redness or signs of infection?     no 5. PAIN: Is the swelling painful to touch? If Yes, ask: How painful is it?   (Scale 1-10; mild, moderate or severe)     Hurt due to the pressure 6. FEVER: Do you have a fever? If Yes, ask: What is it, how was it measured, and when did it start?      no 7. CAUSE: What do you think is causing the leg swelling?     Patient stopped diabetic medications last Friday 8. MEDICAL HISTORY: Do you have a history of blood clots (e.g., DVT), cancer, heart failure, kidney disease, or liver failure?     Kidney disease 9. RECURRENT SYMPTOM: Have you had leg swelling before? If Yes, ask: When was the last time? What happened that time?     no 10. OTHER SYMPTOMS: Do you have any other symptoms? (e.g., chest pain, difficulty breathing)       no  Protocols used: Leg Swelling and Edema-A-AH   Copied from CRM #[id number]. Topic: Clinical - Red Word Triage >>  Oct 20, 2023  8:14 AM Mia F wrote: Red Word that prompted transfer to Nurse Triage: Pt states both legs are swollen. She says this has not happened before. She says she does have PAD in her legs but this has never happened.

## 2023-10-20 NOTE — Telephone Encounter (Signed)
 This was sent to the incorrect practice by [id number] Thank you     Copied from CRM [id number]. Topic: Clinical - Medical Advice >> Oct 20, 2023  8:14 AM Mia F wrote: Reason for CRM: Pt is asking to speak to Nurse Ronal Hun about a medication. She says she was in on Friday and doctor took her off of all of her diabetic medications and she is now asking about getting back on the medications and which medications she should go back taking. Please advise

## 2023-10-20 NOTE — Telephone Encounter (Signed)
 Pt. Scheduled for Tuesday

## 2023-10-24 ENCOUNTER — Ambulatory Visit: Admitting: Family Medicine

## 2023-10-24 ENCOUNTER — Encounter: Payer: Self-pay | Admitting: Family Medicine

## 2023-10-24 VITALS — BP 128/72 | HR 83 | Temp 97.7°F | Ht 62.5 in | Wt 182.4 lb

## 2023-10-24 DIAGNOSIS — M7989 Other specified soft tissue disorders: Secondary | ICD-10-CM

## 2023-10-24 NOTE — Progress Notes (Signed)
 Subjective:    Patient ID: [name], female    DOB: 1955/12/12, 68 y.o.   MRN: [id number]  10/13/23 Patient is a 68 year old female with a history of coronary artery disease as well as type 2 diabetes mellitus and chronic pancreatic insufficiency.  Patient was started on Rybelsus  3 mg daily earlier this summer for her diabetes.  Patient had a cardiac cath on August 21 which showed nonobstructive coronary disease.  Beginning August 23, she developed sore throat, severe nausea and vomiting.  The patient states that she has not been able to eat for 6 days.  She has been unable to keep anything down.  She has been running subjective fevers.  She called yesterday and I called out some Zofran .  She states that the Zofran  has stopped the nausea and vomiting however she continues to have copious watery diarrhea.  She states that she has very little urine output.  Her tongue appears dehydrated.  She reports feeling weak and tired.  She denies any abdominal pain or bloody stool.  At that time, my plan was:I believe the patient has severe viral gastroenteritis.  I am also concerned that she has dehydration.  Recommended Zofran  4 mg every 8 hours as needed for nausea and vomiting.  Due to the severity of the dehydration recommended trying Lomotil  1 to 2 tablets every 6 hours as needed for diarrhea.  Instructed the patient to discontinue Farxiga , metformin , and Rybelsus  until she her symptoms are better to avoid dehydration nausea and lactic acidosis.  Push fluids such as Gatorade.  If she is unable to drink and does not have sufficient urine output I recommended she go to the emergency room for IV fluid.  Suspect that she likely has renal insufficiency but hopefully we can treat this with aggressive oral fluid repletion  10/24/23 When I last saw the patient, I recommended that she push fluids including Gatorade and eat salty foods due to dehydration.  She has resumed her medications except for Rybelsus .  She  denies any further nausea or vomiting.  She denies any fevers or chills or abdominal pain.  She drank large amounts of Gatorade while she was sick.  Now she is concerned because there is swelling in her legs distal to her knees.  On exam today she has trace bipedal edema.  Her lungs are completely clear to auscultation bilaterally.  Recent catheterization of her heart showed nonobstructive coronary artery disease and an ejection fraction of 55 to 65%. Past Medical History:  Diagnosis Date   Aortic atherosclerosis (HCC)    Asthma    CAD (coronary artery disease)    a. LHC 2011: Diffuse distal and branch vessel disease - patient managed medically, no interventional options. b. Nuc 03/2014 - low risk, no ischemia.   Complication of anesthesia    slow to wake up with last surgery in 2011    Diabetes mellitus with circulatory complication (HCC)    Diverticulosis    Dyslipidemia    GERD (gastroesophageal reflux disease)    Headache    hx of migraines    Hypertension    Osteoarthritis    severe right knee  R TKR   PAD (peripheral artery disease) (HCC)    a. Severe stenosis mid right SFA s/p atherectomy 03/25/09. b. peripheral angiography in 12/2011 which showed only about 50% diffuse right SFA stenosis.   Pancreatitis 2003   Renal artery stenosis (HCC)    a. LHC 2011 - 40-50% left RAS.   Tobacco  use disorder    quit 11/10   Past Surgical History:  Procedure Laterality Date   ABDOMINAL AORTAGRAM N/A 12/21/2011   Procedure: ABDOMINAL AORTAGRAM;  Surgeon: Deatrice DELENA Cage, MD;  [facility]: [facility];  Service: Cardiovascular;  Laterality: N/A;   ABDOMINAL AORTOGRAM W/LOWER EXTREMITY Bilateral 05/08/2019   Procedure: ABDOMINAL AORTOGRAM W/LOWER EXTREMITY;  Surgeon: Cage Deatrice DELENA, MD;  [facility];  Service: Cardiovascular;  Laterality: Bilateral;   ABDOMINAL AORTOGRAM W/LOWER EXTREMITY N/A 09/28/2022   Procedure: ABDOMINAL AORTOGRAM W/LOWER EXTREMITY;  Surgeon: Cage Deatrice DELENA, MD;  [facility];  Service: Cardiovascular;  Laterality: N/A;   CARDIAC CATHETERIZATION     ENDARTERECTOMY FEMORAL Right 10/10/2019   Procedure: RIGHT FEMORAL ENDARTERECTOMY;  Surgeon: Gretta Lonni PARAS, MD;  Location: [facility] OR;  Service: Vascular;  Laterality: Right;   ESOPHAGOGASTRODUODENOSCOPY (EGD) WITH PROPOFOL  N/A 10/05/2017   Procedure: ESOPHAGOGASTRODUODENOSCOPY (EGD) WITH PROPOFOL ;  Surgeon: Teressa Toribio SQUIBB, MD;  Location: [facility];  Service: Endoscopy;  Laterality: N/A;   EUS N/A 10/05/2017   Procedure: UPPER ENDOSCOPIC ULTRASOUND (EUS) RADIAL;  Surgeon: Teressa Toribio SQUIBB, MD;  Location: [facility];  Service: Endoscopy;  Laterality: N/A;   FOOT SURGERY Left    LEFT HEART CATH AND CORONARY ANGIOGRAPHY N/A 10/05/2023   Procedure: LEFT HEART CATH AND CORONARY ANGIOGRAPHY;  Surgeon: Cage Deatrice DELENA, MD;  [facility];  Service: Cardiovascular;  Laterality: N/A;   LOWER EXTREMITY ANGIOGRAM Bilateral 05/27/2015   Procedure: Lower Extremity Angiogram;  Surgeon: Deatrice DELENA Cage, MD;  [facility];  Service: Cardiovascular;  Laterality: Bilateral;   PATCH ANGIOPLASTY Right 10/10/2019   Procedure: PATCH ANGIOPLASTY USING GEORGE BIOLOGIC PATCH;  Surgeon: Gretta Lonni PARAS, MD;  Location[facility] OR;  Service: Vascular;  Laterality: Right;   PERIPHERAL INTRAVASCULAR LITHOTRIPSY  09/28/2022   Procedure: PERIPHERAL INTRAVASCULAR LITHOTRIPSY;  Surgeon: Cage Deatrice DELENA, MD;  [facility];  Service: Cardiovascular;;   PERIPHERAL VASCULAR ATHERECTOMY Left 05/08/2019   Procedure: PERIPHERAL VASCULAR ATHERECTOMY;  Surgeon: Cage Deatrice DELENA, MD;  [facility];  Service: Cardiovascular;  Laterality: Left;   PERIPHERAL VASCULAR CATHETERIZATION N/A 05/27/2015   Procedure: Abdominal Aortogram;  Surgeon: Deatrice DELENA Cage, MD;  [facility];  Service: Cardiovascular;  Laterality: N/A;   PERIPHERAL VASCULAR  CATHETERIZATION Right 05/27/2015   Procedure: Peripheral Vascular Intervention;  Surgeon: Deatrice DELENA Cage, MD;  [facility];  Service: Cardiovascular;  Laterality: Right;  SFA   PERIPHERAL VASCULAR INTERVENTION  09/28/2022   Procedure: PERIPHERAL VASCULAR INTERVENTION;  Surgeon: Cage Deatrice DELENA, MD;  [facility];  Service: Cardiovascular;;   TOTAL KNEE ARTHROPLASTY Right 2011   TOTAL KNEE ARTHROPLASTY Left 05/22/2014   Procedure: LEFT TOTAL KNEE ARTHROPLASTY;  Surgeon: Tanda Heading, MD;  [facility]: [facility];  Service: Orthopedics;  Laterality: Left;   TRIGGER FINGER RELEASE Left 07/07/2021   Procedure: LEFT INDEX FINGER RELEASE TRIGGER FINGER/A-1 PULLEY / CARPOMETACARPAL INJECTION;  Surgeon: Romona Harari, MD;  Location: [facility];  Service: Orthopedics;  Laterality: Left;   TUBAL LIGATION  1980   Current Outpatient Medications on File Prior to Visit  Medication Sig Dispense Refill   acetaminophen  (TYLENOL ) 500 MG tablet Take 1,000 mg by mouth every 6 (six) hours as needed for moderate pain or headache.     albuterol  (VENTOLIN  HFA) 108 (90 Base) MCG/ACT inhaler Inhale 2 puffs into the lungs every 6 (six) hours  as needed for wheezing or shortness of breath. 8.5 each 3   aspirin  EC 81 MG tablet Take 81 mg by mouth daily. Swallow whole.     bisoprolol  (ZEBETA ) 5 MG tablet TAKE 1/2 TABLET(2.5 MG) BY MOUTH DAILY 45 tablet 1   Calcium  Polycarbophil (FIBER-CAPS PO) Take 1 capsule by mouth daily.     clopidogrel  (PLAVIX ) 75 MG tablet TAKE 1 TABLET(75 MG) BY MOUTH DAILY 90 tablet 1   COLLAGEN PO Take 1,000 mg by mouth daily.     Cyanocobalamin (VITAMIN B-12) 5000 MCG TBDP Take 5,000 mcg by mouth daily.     diclofenac  sodium (VOLTAREN ) 1 % GEL Apply 2 g topically 4 (four) times daily. (Patient taking differently: Apply 2 g topically daily as needed (pain).) 100 g 0   diphenoxylate -atropine  (LOMOTIL ) 2.5-0.025 MG tablet Take 2 tablets by mouth 4 (four)  times daily as needed for diarrhea or loose stools. 30 tablet 0   FARXIGA  10 MG TABS tablet TAKE 1 TABLET BY MOUTH EVERY DAY 90 tablet 0   fluticasone -salmeterol (ADVAIR  DISKUS) 250-50 MCG/ACT AEPB Inhale 1 puff into the lungs in the morning and at bedtime. 60 each 11   Homeopathic Products (THERAWORX MUSCLE CRAMPS) FOAM Apply 1 application  topically daily as needed (Muscle cramps).     HYDROcodone -acetaminophen  (NORCO/VICODIN) 5-325 MG tablet Take 1 tablet by mouth every 6 (six) hours as needed for moderate pain (pain score 4-6). Patient needs to schedule office visit 130 tablet 0   isosorbide  mononitrate (IMDUR ) 60 MG 24 hr tablet Take 1.5 tablets (90 mg total) by mouth daily. 135 tablet 1   lipase/protease/amylase (CREON ) 36000 UNITS CPEP capsule Take 3 capsules (108,000 Units total) by mouth 3 (three) times daily with meals. May also take 1 capsule (36,000 Units total) as needed (with snacks). 200 capsule 3   loratadine (CLARITIN) 10 MG tablet Take 10 mg by mouth daily.     metFORMIN  (GLUCOPHAGE ) 1000 MG tablet Take 1 tablet (1,000 mg total) by mouth 2 (two) times daily with a meal. 180 tablet 2   methocarbamol  (ROBAXIN ) 500 MG tablet TAKE 1 TABLET BY MOUTH EVERY 6 HOURS AS NEEDED FOR MUSCLE SPASM 120 tablet 3   Multiple Vitamin (MULTIVITAMIN) tablet Take 1 tablet by mouth daily.     nitroGLYCERIN  (NITROSTAT ) 0.4 MG SL tablet Place 1 tablet under the tongue every 5 (five) minutes as needed for chest pain.     ondansetron  (ZOFRAN ) 4 MG tablet Take 1 tablet (4 mg total) by mouth every 8 (eight) hours as needed for nausea or vomiting. 20 tablet 0   ONETOUCH ULTRA TEST test strip USE TO CHECK BLOOD SUGARS UP TO 4 TIMES PER DAY. 100 strip 1   Oxymetazoline HCl (NASAL SPRAY NA) Place 1 spray into the nose daily as needed (Congestion/allergies).     pantoprazole  (PROTONIX ) 40 MG tablet Take 1 tablet (40 mg total) by mouth daily. 90 tablet 1   pioglitazone  (ACTOS ) 30 MG tablet TAKE 1 TABLET BY MOUTH  EVERY DAY 90 tablet 1   pregabalin  (LYRICA ) 50 MG capsule Take 1 capsule (50 mg total) by mouth 3 (three) times daily. 90 capsule 2   Probiotic Product (TRUBIOTICS PO) Take 1 capsule by mouth daily.     promethazine  (PHENERGAN ) 25 MG tablet TAKE 1 TABLET(25 MG) BY MOUTH EVERY 8 HOURS AS NEEDED FOR NAUSEA OR VOMITING 30 tablet 2   rosuvastatin  (CRESTOR ) 40 MG tablet TAKE 1 TABLET(40 MG) BY MOUTH EVERY NIGHT 90 tablet 1  Semaglutide  (RYBELSUS ) 3 MG TABS Take 1 tablet (3 mg total) by mouth daily. 30 tablet 2   No current facility-administered medications on file prior to visit.   Allergies  Allergen Reactions   Gabapentin  Other (See Comments)    Brain shakes   Glucotrol [Glipizide] Rash and Other (See Comments)    Red rash   Influenza Vaccines Other (See Comments)    Significant arm soreness requiring 1 year of physical therapy   Latex Rash   Nortriptyline  Other (See Comments)    BRAIN SHAKE   Tetanus Toxoid Hives, Swelling, Rash and Other (See Comments)    Swelling to site of injection with rash and fever   Other Itching, Other (See Comments) and Cough    Patient is highly allergic to Cats and she begins to break out in rash and turns red if someone caring for her has cats at home.  Patient takes Claritin for this type of reaction.  Patient stated that if it goes untreated then he begins to have difficulty breathing   Prednisone  & Diphenhydramine     Pt get really headaches   Invokana  [Canagliflozin ] Other (See Comments)    Yeast Infections   Social History   Socioeconomic History   Marital status: Married    Spouse name: Not on file   Number of children: Not on file   Years of education: 10   Highest education level: Not on file  Occupational History   Not on file  Tobacco Use   Smoking status: Former    Current packs/day: 0.00    Types: Cigarettes    Quit date: 12/13/2008    Years since quitting: 14.8   Smokeless tobacco: Never  Vaping Use   Vaping status: Never Used   Substance and Sexual Activity   Alcohol use: No    Comment: Quit drinking around year 2000   Drug use: No    Comment: Patient quit smoking around 2011   Sexual activity: Yes    Partners: Male    Birth control/protection: Post-menopausal  Other Topics Concern   Not on file  Social History Narrative   Denies any IV drug use or marijuana use.  Former smoker for 30 years, quit Nov. 2010. She is married with 2 children. Disabled from knee, not working.   Social Drivers of Health   Financial Resource Strain: Medium Risk (09/28/2018)   Overall Financial Resource Strain (CARDIA)    Difficulty of Paying Living Expenses: Somewhat hard  Food Insecurity: Not on file  Transportation Needs: No Transportation Needs (09/28/2018)   PRAPARE - Administrator, Civil Service (Medical): No    Lack of Transportation (Non-Medical): No  Physical Activity: Not on file  Stress: Not on file  Social Connections: Unknown (09/28/2018)   Social Connection and Isolation Panel    Frequency of Communication with Friends and Family: Once a week    Frequency of Social Gatherings with Friends and Family: Once a week    Attends Religious Services: Not on Marketing executive or Organizations: Not on file    Attends Banker Meetings: Not on file    Marital Status: Married  Intimate Partner Violence: Not At Risk (09/28/2018)   Humiliation, Afraid, Rape, and Kick questionnaire    Fear of Current or Ex-Partner: No    Emotionally Abused: No    Physically Abused: No    Sexually Abused: No   Family History  Problem Relation Age of Onset  CVA Mother    Emphysema Father    Breast cancer Sister 50   Colon cancer Neg Hx      Review of Systems  All other systems reviewed and are negative.      Objective:   Physical Exam Vitals reviewed.  Constitutional:      General: She is not in acute distress.    Appearance: She is well-developed. She is not diaphoretic.  Neck:      Vascular: No JVD.  Cardiovascular:     Rate and Rhythm: Normal rate and regular rhythm.     Heart sounds: Normal heart sounds. No murmur heard. Pulmonary:     Effort: Pulmonary effort is normal.     Breath sounds: No wheezing, rhonchi or rales.  Chest:     Chest wall: No crepitus.  Abdominal:     General: There is no distension.     Palpations: Abdomen is soft.     Tenderness: There is no abdominal tenderness. There is no guarding or rebound.    Minimal swelling in both feet.  Trace bipedal edema.     Assessment & Plan:  Leg swelling I believe that this is leg swelling due to drinking large amounts of fluid and eating large amounts of sodium while she was sick.  Thankfully she is no longer dehydrated.  I believe that her body will mobilize this extracellular fluid over the next 7 days and she will diurese this.  I see no evidence of a DVT or congestive heart failure.  Reassured the patient.  If the swelling persists, consider Actos  as a possible cause.

## 2023-11-01 ENCOUNTER — Telehealth: Payer: Self-pay

## 2023-11-01 NOTE — Telephone Encounter (Signed)
 Copied from CRM #[id number]. Topic: Clinical - Prescription Issue >> Nov 01, 2023  9:15 AM [name] wrote: Reason for CRM: Pt was told to follow up with Dr. Duanne regarding the  rybelsus  3 mg medication. Pt stated it made her very sick.    Pt would like to go back to medication that was taking before. Januvia  100 mg    Pt would also like a promethazine  25mg - states she has not refills on it.   Pls follow up with pt

## 2023-11-02 ENCOUNTER — Other Ambulatory Visit: Payer: Self-pay | Admitting: Family Medicine

## 2023-11-02 MED ORDER — SITAGLIPTIN PHOSPHATE 100 MG PO TABS: 100.0000 mg | ORAL_TABLET | Freq: Every day | ORAL | 3 refills | Status: AC

## 2023-11-03 ENCOUNTER — Other Ambulatory Visit (HOSPITAL_COMMUNITY)

## 2023-11-06 ENCOUNTER — Other Ambulatory Visit: Payer: Self-pay | Admitting: Family Medicine

## 2023-11-07 ENCOUNTER — Other Ambulatory Visit: Payer: Self-pay | Admitting: Family Medicine

## 2023-11-07 DIAGNOSIS — I701 Atherosclerosis of renal artery: Secondary | ICD-10-CM

## 2023-11-07 DIAGNOSIS — I739 Peripheral vascular disease, unspecified: Secondary | ICD-10-CM

## 2023-11-07 DIAGNOSIS — I70209 Unspecified atherosclerosis of native arteries of extremities, unspecified extremity: Secondary | ICD-10-CM

## 2023-11-07 DIAGNOSIS — M79604 Pain in right leg: Secondary | ICD-10-CM

## 2023-11-07 NOTE — Telephone Encounter (Signed)
 Requested medications are due for refill today.  yes  Requested medications are on the active medications list.  yes  Last refill. 05/04/2023 #30 2 rf  Future visit scheduled.   yes  Notes to clinic.  Refill not delegated    Requested Prescriptions  Pending Prescriptions Disp Refills   promethazine  (PHENERGAN ) 25 MG tablet [Pharmacy Med Name: PROMETHAZINE  25 MG TABLET] 30 tablet 2    Sig: TAKE 1 TABLET(25 MG) BY MOUTH EVERY 8 HOURS AS NEEDED FOR NAUSEA OR VOMITING     Not Delegated - Gastroenterology: Antiemetics Failed - 11/07/2023  1:59 PM      Failed - This refill cannot be delegated      Passed - Valid encounter within last 6 months    Recent Outpatient Visits           2 weeks ago Leg swelling   [place] [facility] Duanne Butler DASEN, MD   3 weeks ago Nausea and vomiting, unspecified vomiting type   [facility] Duanne Butler DASEN, MD   3 months ago Type 2 diabetes mellitus with other circulatory complication, without long-term current use of insulin  [facility])   [place] [facility] Duanne Butler DASEN, MD   6 months ago Type 2 diabetes mellitus with other circulatory complication, without long-term current use of insulin  [facility])   [place] [facility] Duanne Butler DASEN, MD   7 months ago Acute cough    [facility] Pickard, Butler DASEN, MD

## 2023-11-07 NOTE — Telephone Encounter (Signed)
 Requested medications are due for refill today.  Too soon  Requested medications are on the active medications list.  yes  Last refill. 07/17/2023 #120 3 rf  Future visit scheduled.   yes  Notes to clinic.  Refill not delegated.    Requested Prescriptions  Pending Prescriptions Disp Refills   methocarbamol  (ROBAXIN ) 500 MG tablet [Pharmacy Med Name: METHOCARBAMOL  500 MG TABLET] 120 tablet 3    Sig: TAKE 1 TABLET BY MOUTH EVERY 6 HOURS AS NEEDED FOR MUSCLE SPASM     Not Delegated - Analgesics:  Muscle Relaxants Failed - 11/07/2023  5:20 PM      Failed - This refill cannot be delegated      Passed - Valid encounter within last 6 months    Recent Outpatient Visits           2 weeks ago Leg swelling   [place] [facility] Duanne Butler DASEN, MD   3 weeks ago Nausea and vomiting, unspecified vomiting type   [place] [facility] Duanne Butler DASEN, MD   3 months ago Type 2 diabetes mellitus with other circulatory complication, without long-term current use of insulin  [facility])   [facility] Duanne Butler DASEN, MD   6 months ago Type 2 diabetes mellitus with other circulatory complication, without long-term current use of insulin  [facility])   [place] [facility] Pickard, Butler DASEN, MD   7 months ago Acute cough   [place] [facility] Pickard, Butler DASEN, MD

## 2023-11-07 NOTE — Telephone Encounter (Signed)
 Prescription Request  11/07/2023  LOV: 10/24/2023  What is the name of the medication or equipment?   clopidogrel  (PLAVIX ) 75 MG tablet  **90 day script requested**  Have you contacted your pharmacy to request a refill? Yes   Which pharmacy would you like this sent to?  [id number] [address], [place] - [address] [place] [address] Phone: [phone] Fax: [phone]    Patient notified that their request is being sent to the clinical staff for review and that they should receive a response within 2 business days.   Please advise pharmacist.

## 2023-11-08 ENCOUNTER — Ambulatory Visit: Admitting: Physician Assistant

## 2023-11-09 MED ORDER — CLOPIDOGREL BISULFATE 75 MG PO TABS: ORAL_TABLET | ORAL | 1 refills | Status: AC

## 2023-11-09 NOTE — Telephone Encounter (Signed)
 Requested Prescriptions  Pending Prescriptions Disp Refills   clopidogrel  (PLAVIX ) 75 MG tablet 90 tablet 1    Sig: TAKE 1 TABLET(75 MG) BY MOUTH DAILY     Hematology: Antiplatelets - clopidogrel  Passed - 11/09/2023  7:50 AM      Passed - HCT in normal range and within 180 days    Hematocrit  Date Value Ref Range Status  09/26/2023 40.0 34.0 - 46.6 % Final         Passed - HGB in normal range and within 180 days    Hemoglobin  Date Value Ref Range Status  09/26/2023 12.4 11.1 - 15.9 g/dL Final         Passed - PLT in normal range and within 180 days    Platelets  Date Value Ref Range Status  09/26/2023 271 150 - 450 x10E3/uL Final         Passed - Cr in normal range and within 360 days    Creat  Date Value Ref Range Status  07/07/2023 0.99 0.50 - 1.05 mg/dL Final   Creatinine, Ser  Date Value Ref Range Status  09/26/2023 0.98 0.57 - 1.00 mg/dL Final   Creatinine, Urine  Date Value Ref Range Status  07/07/2023 54 20 - 275 mg/dL Final         Passed - Valid encounter within last 6 months    Recent Outpatient Visits           2 weeks ago Leg swelling   [place] [facility] Duanne Butler DASEN, MD   3 weeks ago Nausea and vomiting, unspecified vomiting type   [place] [facility] Duanne Butler DASEN, MD   3 months ago Type 2 diabetes mellitus with other circulatory complication, without long-term current use of insulin  [facility])   [place] [facility] Duanne Butler DASEN, MD   7 months ago Type 2 diabetes mellitus with other circulatory complication, without long-term current use of insulin  [facility])   [place] [facility] Duanne Butler DASEN, MD   7 months ago Acute cough   [place] [facility] Pickard, Butler DASEN, MD

## 2023-11-15 ENCOUNTER — Other Ambulatory Visit: Payer: Self-pay

## 2023-11-15 ENCOUNTER — Ambulatory Visit

## 2023-11-15 VITALS — Ht 62.5 in | Wt 182.0 lb

## 2023-11-15 DIAGNOSIS — Z0000 Encounter for general adult medical examination without abnormal findings: Secondary | ICD-10-CM

## 2023-11-15 NOTE — Telephone Encounter (Signed)
 Patient seen for AWV and is asking for refill of Hydrocodone .

## 2023-11-15 NOTE — Patient Instructions (Signed)
 [name],  Thank you for taking the time for your Medicare Wellness Visit. I appreciate your continued commitment to your health goals. Please review the care plan we discussed, and feel free to reach out if I can assist you further.  Medicare recommends these wellness visits once per year to help you and your care team stay ahead of potential health issues. These visits are designed to focus on prevention, allowing your provider to concentrate on managing your acute and chronic conditions during your regular appointments.  Please note that Annual Wellness Visits do not include a physical exam. Some assessments may be limited, especially if the visit was conducted virtually. If needed, we may recommend a separate in-person follow-up with your provider.  Ongoing Care Seeing your primary care provider every 3 to 6 months helps us  monitor your health and provide consistent, personalized care.   Referrals If a referral was made during today's visit and you haven't received any updates within two weeks, please contact the referred provider directly to check on the status.  Recommended Screenings:  Health Maintenance  Topic Date Due   Zoster (Shingles) Vaccine (2 of 2) 07/29/2022   Complete foot exam   07/31/2022   COVID-19 Vaccine (4 - Moderna risk 2024-25 season) 10/16/2023   Flu Shot  05/14/2024*   Eye exam for diabetics  01/04/2024   Hemoglobin A1C  01/07/2024   Breast Cancer Screening  06/23/2024   Yearly kidney health urinalysis for diabetes  07/06/2024   Yearly kidney function blood test for diabetes  09/25/2024   Medicare Annual Wellness Visit  11/14/2024   Cologuard (Stool DNA test)  06/08/2025   Pneumococcal Vaccine for age over 97  Completed   DEXA scan (bone density measurement)  Completed   Hepatitis C Screening  Completed   HPV Vaccine  Aged Out   Meningitis B Vaccine  Aged Out   DTaP/Tdap/Td vaccine  Discontinued  *Topic was postponed. The date shown is not the original  due date.       11/15/2023    3:21 PM  Advanced Directives  Does Patient Have a Medical Advance Directive? No  Would patient like information on creating a medical advance directive? Yes (MAU/Ambulatory/Procedural Areas - Information given)   Advance Care Planning is important because it: Ensures you receive medical care that aligns with your values, goals, and preferences. Provides guidance to your family and loved ones, reducing the emotional burden of decision-making during critical moments.  Information on Advanced Care Planning can be found at [place]  Secretary of [facility] Advance Health Care Directives Advance Health Care Directives ([email or web address]   Vision: Annual vision screenings are recommended for early detection of glaucoma, cataracts, and diabetic retinopathy. These exams can also reveal signs of chronic conditions such as diabetes and high blood pressure.  Dental: Annual dental screenings help detect early signs of oral cancer, gum disease, and other conditions linked to overall health, including heart disease and diabetes.  Please see the attached documents for additional preventive care recommendations.

## 2023-11-15 NOTE — Progress Notes (Signed)
 Subjective:   [name] is a 68 y.o. who presents for a Medicare Wellness preventive visit.  As a reminder, Annual Wellness Visits don't include a physical exam, and some assessments may be limited, especially if this visit is performed virtually. We may recommend an in-person follow-up visit with your provider if needed.  Visit Complete: Virtual I connected with  [name] on 11/15/23 by a audio enabled telemedicine application and verified that I am speaking with the correct person using two identifiers.  Patient Location: Home  Provider Location: Home Office  I discussed the limitations of evaluation and management by telemedicine. The patient expressed understanding and agreed to proceed.  Vital Signs: Because this visit was a virtual/telehealth visit, some criteria may be missing or patient reported. Any vitals not documented were not able to be obtained and vitals that have been documented are patient reported.  VideoDeclined- This patient declined Librarian, academic. Therefore the visit was completed with audio only.  Persons Participating in Visit: Patient.  AWV Questionnaire: No: Patient Medicare AWV questionnaire was not completed prior to this visit.  Cardiac Risk Factors include: advanced age (>43men, >17 women);hypertension;dyslipidemia;diabetes mellitus;smoking/ tobacco exposure     Objective:    Today's Vitals   11/15/23 1517  Weight: 182 lb (82.6 kg)  Height: 5' 2.5 (1.588 m)   Body mass index is 32.76 kg/m.     11/15/2023    3:21 PM 10/05/2023    5:36 AM 11/04/2022    1:31 PM 09/28/2022    6:53 AM 07/07/2021    9:27 AM 06/29/2021   11:43 AM 10/10/2019   10:05 AM  Advanced Directives  Does Patient Have a Medical Advance Directive? No No No No No No No  Would patient like information on creating a medical advance directive? Yes (MAU/Ambulatory/Procedural Areas - Information given) No - Patient declined  No -  Patient declined No - Patient declined No - Patient declined No - Patient declined    Current Medications (verified) Outpatient Encounter Medications as of 11/15/2023  Medication Sig   acetaminophen  (TYLENOL ) 500 MG tablet Take 1,000 mg by mouth every 6 (six) hours as needed for moderate pain or headache.   albuterol  (VENTOLIN  HFA) 108 (90 Base) MCG/ACT inhaler Inhale 2 puffs into the lungs every 6 (six) hours as needed for wheezing or shortness of breath.   aspirin  EC 81 MG tablet Take 81 mg by mouth daily. Swallow whole.   bisoprolol  (ZEBETA ) 5 MG tablet TAKE 1/2 TABLET(2.5 MG) BY MOUTH DAILY   Calcium  Polycarbophil (FIBER-CAPS PO) Take 1 capsule by mouth daily.   clopidogrel  (PLAVIX ) 75 MG tablet TAKE 1 TABLET(75 MG) BY MOUTH DAILY   COLLAGEN PO Take 1,000 mg by mouth daily.   Cyanocobalamin (VITAMIN B-12) 5000 MCG TBDP Take 5,000 mcg by mouth daily.   diclofenac  sodium (VOLTAREN ) 1 % GEL Apply 2 g topically 4 (four) times daily. (Patient taking differently: Apply 2 g topically daily as needed (pain).)   diphenoxylate -atropine  (LOMOTIL ) 2.5-0.025 MG tablet Take 2 tablets by mouth 4 (four) times daily as needed for diarrhea or loose stools.   FARXIGA  10 MG TABS tablet TAKE 1 TABLET BY MOUTH EVERY DAY   fluticasone -salmeterol (ADVAIR  DISKUS) 250-50 MCG/ACT AEPB Inhale 1 puff into the lungs in the morning and at bedtime.   Homeopathic Products [facility] MUSCLE CRAMPS) FOAM Apply 1 application  topically daily as needed (Muscle cramps).   HYDROcodone -acetaminophen  (NORCO/VICODIN) 5-325 MG tablet Take 1 tablet by mouth every  6 (six) hours as needed for moderate pain (pain score 4-6). Patient needs to schedule office visit   isosorbide  mononitrate (IMDUR ) 60 MG 24 hr tablet Take 1.5 tablets (90 mg total) by mouth daily.   lipase/protease/amylase (CREON ) 36000 UNITS CPEP capsule Take 3 capsules (108,000 Units total) by mouth 3 (three) times daily with meals. May also take 1 capsule (36,000 Units  total) as needed (with snacks).   loratadine (CLARITIN) 10 MG tablet Take 10 mg by mouth daily.   metFORMIN  (GLUCOPHAGE ) 1000 MG tablet Take 1 tablet (1,000 mg total) by mouth 2 (two) times daily with a meal.   methocarbamol  (ROBAXIN ) 500 MG tablet TAKE 1 TABLET BY MOUTH EVERY 6 HOURS AS NEEDED FOR MUSCLE SPASM   Multiple Vitamin (MULTIVITAMIN) tablet Take 1 tablet by mouth daily.   nitroGLYCERIN  (NITROSTAT ) 0.4 MG SL tablet Place 1 tablet under the tongue every 5 (five) minutes as needed for chest pain.   ondansetron  (ZOFRAN ) 4 MG tablet Take 1 tablet (4 mg total) by mouth every 8 (eight) hours as needed for nausea or vomiting.   ONETOUCH ULTRA TEST test strip USE TO CHECK BLOOD SUGARS UP TO 4 TIMES PER DAY.   Oxymetazoline HCl (NASAL SPRAY NA) Place 1 spray into the nose daily as needed (Congestion/allergies).   pantoprazole  (PROTONIX ) 40 MG tablet Take 1 tablet (40 mg total) by mouth daily.   pioglitazone  (ACTOS ) 30 MG tablet TAKE 1 TABLET BY MOUTH EVERY DAY   pregabalin  (LYRICA ) 50 MG capsule Take 1 capsule (50 mg total) by mouth 3 (three) times daily.   Probiotic Product (TRUBIOTICS PO) Take 1 capsule by mouth daily.   promethazine  (PHENERGAN ) 25 MG tablet TAKE 1 TABLET(25 MG) BY MOUTH EVERY 8 HOURS AS NEEDED FOR NAUSEA OR VOMITING   rosuvastatin  (CRESTOR ) 40 MG tablet TAKE 1 TABLET(40 MG) BY MOUTH EVERY NIGHT   sitaGLIPtin  (JANUVIA ) 100 MG tablet Take 1 tablet (100 mg total) by mouth daily.   No facility-administered encounter medications on file as of 11/15/2023.    Allergies (verified) Gabapentin , Glucotrol [glipizide], Influenza vaccines, Latex, Nortriptyline , Tetanus toxoid, Other, Prednisone  & diphenhydramine, and Invokana  [canagliflozin ]   History: Past Medical History:  Diagnosis Date   Aortic atherosclerosis    Asthma    CAD (coronary artery disease)    a. LHC 2011: Diffuse distal and branch vessel disease - patient managed medically, no interventional options. b. Nuc 03/2014  - low risk, no ischemia.   Complication of anesthesia    slow to wake up with last surgery in 2011    Diabetes mellitus with circulatory complication (HCC)    Diverticulosis    Dyslipidemia    GERD (gastroesophageal reflux disease)    Headache    hx of migraines    Hypertension    Osteoarthritis    severe right knee  R TKR   PAD (peripheral artery disease)    a. Severe stenosis mid right SFA s/p atherectomy 03/25/09. b. peripheral angiography in 12/2011 which showed only about 50% diffuse right SFA stenosis.   Pancreatitis 2003   Renal artery stenosis    a. LHC 2011 - 40-50% left RAS.   Tobacco use disorder    quit 11/10   Past Surgical History:  Procedure Laterality Date   ABDOMINAL AORTAGRAM N/A 12/21/2011   Procedure: ABDOMINAL AORTAGRAM;  Surgeon: Deatrice DELENA Cage, MD;  Location: [facility] CATH LAB;  Service: Cardiovascular;  Laterality: N/A;   ABDOMINAL AORTOGRAM W/LOWER EXTREMITY Bilateral 05/08/2019   Procedure: ABDOMINAL AORTOGRAM W/LOWER EXTREMITY;  Surgeon: Darron Deatrice LABOR, MD;  Location: [facility] INVASIVE CV LAB;  Service: Cardiovascular;  Laterality: Bilateral;   ABDOMINAL AORTOGRAM W/LOWER EXTREMITY N/A 09/28/2022   Procedure: ABDOMINAL AORTOGRAM W/LOWER EXTREMITY;  Surgeon: Darron Deatrice LABOR, MD;  Location: [facility] INVASIVE CV LAB;  Service: Cardiovascular;  Laterality: N/A;   CARDIAC CATHETERIZATION     ENDARTERECTOMY FEMORAL Right 10/10/2019   Procedure: RIGHT FEMORAL ENDARTERECTOMY;  Surgeon: Gretta Lonni PARAS, MD;  Location: [facility] OR;  Service: Vascular;  Laterality: Right;   ESOPHAGOGASTRODUODENOSCOPY (EGD) WITH PROPOFOL  N/A 10/05/2017   Procedure: ESOPHAGOGASTRODUODENOSCOPY (EGD) WITH PROPOFOL ;  Surgeon: Teressa Toribio SQUIBB, MD;  Location: [facility] ENDOSCOPY;  Service: Endoscopy;  Laterality: N/A;   EUS N/A 10/05/2017   Procedure: UPPER ENDOSCOPIC ULTRASOUND (EUS) RADIAL;  Surgeon: Teressa Toribio SQUIBB, MD;  Location: [facility] ENDOSCOPY;  Service: Endoscopy;  Laterality: N/A;   FOOT SURGERY Left     LEFT HEART CATH AND CORONARY ANGIOGRAPHY N/A 10/05/2023   Procedure: LEFT HEART CATH AND CORONARY ANGIOGRAPHY;  Surgeon: Darron Deatrice LABOR, MD;  Location: [facility] INVASIVE CV LAB;  Service: Cardiovascular;  Laterality: N/A;   LOWER EXTREMITY ANGIOGRAM Bilateral 05/27/2015   Procedure: Lower Extremity Angiogram;  Surgeon: Deatrice LABOR Darron, MD;  Location: [facility] INVASIVE CV LAB;  Service: Cardiovascular;  Laterality: Bilateral;   PATCH ANGIOPLASTY Right 10/10/2019   Procedure: PATCH ANGIOPLASTY USING GEORGE BIOLOGIC PATCH;  Surgeon: Gretta Lonni PARAS, MD;  Location[facility] OR;  Service: Vascular;  Laterality: Right;   PERIPHERAL INTRAVASCULAR LITHOTRIPSY  09/28/2022   Procedure: PERIPHERAL INTRAVASCULAR LITHOTRIPSY;  Surgeon: Darron Deatrice LABOR, MD;  Location: [facility] INVASIVE CV LAB;  Service: Cardiovascular;;   PERIPHERAL VASCULAR ATHERECTOMY Left 05/08/2019   Procedure: PERIPHERAL VASCULAR ATHERECTOMY;  Surgeon: Darron Deatrice LABOR, MD;  Location: [facility] INVASIVE CV LAB;  Service: Cardiovascular;  Laterality: Left;   PERIPHERAL VASCULAR CATHETERIZATION N/A 05/27/2015   Procedure: Abdominal Aortogram;  Surgeon: Deatrice LABOR Darron, MD;  Location: [facility] INVASIVE CV LAB;  Service: Cardiovascular;  Laterality: N/A;   PERIPHERAL VASCULAR CATHETERIZATION Right 05/27/2015   Procedure: Peripheral Vascular Intervention;  Surgeon: Deatrice LABOR Darron, MD;  Location: [facility] INVASIVE CV LAB;  Service: Cardiovascular;  Laterality: Right;  SFA   PERIPHERAL VASCULAR INTERVENTION  09/28/2022   Procedure: PERIPHERAL VASCULAR INTERVENTION;  Surgeon: Darron Deatrice LABOR, MD;  Location: [facility] INVASIVE CV LAB;  Service: Cardiovascular;;   TOTAL KNEE ARTHROPLASTY Right 2011   TOTAL KNEE ARTHROPLASTY Left 05/22/2014   Procedure: LEFT TOTAL KNEE ARTHROPLASTY;  Surgeon: Tanda Heading, MD;  Location: [facility] ORS;  Service: Orthopedics;  Laterality: Left;   TRIGGER FINGER RELEASE Left 07/07/2021   Procedure: LEFT INDEX FINGER RELEASE TRIGGER FINGER/A-1 PULLEY /  CARPOMETACARPAL INJECTION;  Surgeon: Romona Harari, MD;  Location: [facility];  Service: Orthopedics;  Laterality: Left;   TUBAL LIGATION  1980   Family History  Problem Relation Age of Onset   CVA Mother    Emphysema Father    Breast cancer Sister 61   Colon cancer Neg Hx    Social History   Socioeconomic History   Marital status: Married    Spouse name: Not on file   Number of children: Not on file   Years of education: 10   Highest education level: Not on file  Occupational History   Not on file  Tobacco Use   Smoking status: Former    Current packs/day: 0.00    Types: Cigarettes    Quit date: 12/13/2008    Years since quitting: 14.9   Smokeless tobacco:  Never  Vaping Use   Vaping status: Never Used  Substance and Sexual Activity   Alcohol use: No    Comment: Quit drinking around year 2000   Drug use: No    Comment: Patient quit smoking around 2011   Sexual activity: Yes    Partners: Male    Birth control/protection: Post-menopausal  Other Topics Concern   Not on file  Social History Narrative   Denies any IV drug use or marijuana use.  Former smoker for 30 years, quit Nov. 2010. She is married with 2 children. Disabled from knee, not working.   Social Drivers of Corporate investment banker Strain: Low Risk  (11/15/2023)   Overall Financial Resource Strain (CARDIA)    Difficulty of Paying Living Expenses: Not very hard  Food Insecurity: No Food Insecurity (11/15/2023)   Hunger Vital Sign    Worried About Running Out of Food in the Last Year: Never true    Ran Out of Food in the Last Year: Never true  Transportation Needs: No Transportation Needs (11/15/2023)   PRAPARE - Administrator, Civil Service (Medical): No    Lack of Transportation (Non-Medical): No  Physical Activity: Insufficiently Active (11/15/2023)   Exercise Vital Sign    Days of Exercise per Week: 3 days    Minutes of Exercise per Session: 30 min  Stress: No Stress  Concern Present (11/15/2023)   [employer] of Occupational Health - Occupational Stress Questionnaire    Feeling of Stress: Only a little  Social Connections: Moderately Isolated (11/15/2023)   Social Connection and Isolation Panel    Frequency of Communication with Friends and Family: More than three times a week    Frequency of Social Gatherings with Friends and Family: Once a week    Attends Religious Services: Never    Database administrator or Organizations: No    Attends Engineer, structural: Never    Marital Status: Married    Tobacco Counseling Counseling given: Not Answered    Clinical Intake:  Pre-visit preparation completed: Yes  Pain : No/denies pain  Diabetes: Yes CBG done?: No Did pt. bring in CBG monitor from home?: No  Lab Results  Component Value Date   HGBA1C 7.3 (H) 07/07/2023   HGBA1C 8.3 (H) 04/07/2023   HGBA1C 7.5 (H) 10/21/2022     How often do you need to have someone help you when you read instructions, pamphlets, or other written materials from your doctor or pharmacy?: 1 - Never  Interpreter Needed?: No  Information entered by :: Charmaine Bloodgood LPN   Activities of Daily Living     11/15/2023    3:20 PM  In your present state of health, do you have any difficulty performing the following activities:  Hearing? 0  Vision? 0  Difficulty concentrating or making decisions? 0  Walking or climbing stairs? 0  Dressing or bathing? 0  Doing errands, shopping? 0  Preparing Food and eating ? N  Using the Toilet? N  In the past six months, have you accidently leaked urine? N  Do you have problems with loss of bowel control? N  Managing your Medications? N  Managing your Finances? N  Housekeeping or managing your Housekeeping? N    Patient Care Team: Duanne Butler DASEN, MD as PCP - General (Family Medicine) Darron Deatrice LABOR, MD as PCP - Cardiology (Cardiology) Rolan Ezra RAMAN, MD as Consulting Physician (Cardiology) Darron Deatrice LABOR, MD as Consulting Physician (Cardiology) Rodgers,  Catherine E, OT as Occupational Therapist (Occupational Therapy) [facility]  I have updated your Care Teams any recent Medical Services you may have received from other providers in the past year.     Assessment:   This is a routine wellness examination for [name].  Hearing/Vision screen Hearing Screening - Comments:: Patient is able to hear conversational tones without difficulty. No issues reported.   Vision Screening - Comments::  up to date with routine eye exams with [facility]    Goals Addressed             This Visit's Progress    Maintain health and independence   On track      Depression Screen     11/15/2023    3:19 PM 11/03/2022   11:14 AM 10/21/2022   10:44 AM 07/20/2022    3:02 PM 06/03/2022   10:29 AM 02/23/2022    2:20 PM 09/30/2021    4:16 PM  PHQ 2/9 Scores  PHQ - 2 Score 0 0 0 0 0 0 0    Fall Risk     11/15/2023    3:20 PM 11/03/2022   11:14 AM 10/21/2022   10:43 AM 06/03/2022   10:29 AM 09/30/2021    4:17 PM  Fall Risk   Falls in the past year? 0 1 0 0 1  Number falls in past yr: 0 1 0 0 0  Injury with Fall? 0 0 0 0 0  Risk for fall due to : No Fall Risks History of fall(s) No Fall Risks No Fall Risks History of fall(s)  Follow up Falls prevention discussed;Education provided;Falls evaluation completed  Falls prevention discussed Falls prevention discussed Falls prevention discussed      Data saved with a previous flowsheet row definition    MEDICARE RISK AT HOME:  Medicare Risk at Home Any stairs in or around the home?: No If so, are there any without handrails?: No Home free of loose throw rugs in walkways, pet beds, electrical cords, etc?: Yes Adequate lighting in your home to reduce risk of falls?: Yes Life alert?: No Use of a cane, walker or w/c?: No Grab bars in the bathroom?: Yes Shower chair or bench in shower?: No Elevated toilet seat or a handicapped  toilet?: No  TIMED UP AND GO:  Was the test performed?  No  Cognitive Function: 6CIT completed        11/15/2023    3:21 PM  6CIT Screen  What Year? 0 points  What month? 0 points  What time? 0 points  Count back from 20 0 points  Months in reverse 0 points  Repeat phrase 0 points  Total Score 0 points    Immunizations Immunization History  Administered Date(s) Administered    sv, Bivalent, Protein Subunit Rsvpref,pf [place]) 10/28/2022   Influenza-Unspecified 03/27/2009   Moderna Covid-19 Fall Seasonal Vaccine 50yrs & older 10/28/2022   Moderna Sars-Covid-2 Vaccination 11/24/2019, 12/23/2019   PNEUMOCOCCAL CONJUGATE-20 06/03/2022   Pneumococcal Conjugate-13 08/03/2015   Pneumococcal Polysaccharide-23 03/27/2009, 04/06/2017   Zoster Recombinant(Shingrix ) 06/03/2022    Screening Tests Health Maintenance  Topic Date Due   Zoster Vaccines- Shingrix  (2 of 2) 07/29/2022   FOOT EXAM  07/31/2022   COVID-19 Vaccine (4 - Moderna risk 2024-25 season) 10/16/2023   Influenza Vaccine  05/14/2024 (Originally 09/15/2023)   OPHTHALMOLOGY EXAM  01/04/2024   HEMOGLOBIN A1C  01/07/2024   Mammogram  06/23/2024   Diabetic kidney evaluation - Urine ACR  07/06/2024  Diabetic kidney evaluation - eGFR measurement  09/25/2024   Medicare Annual Wellness (AWV)  11/14/2024   Fecal DNA (Cologuard)  06/08/2025   Pneumococcal Vaccine: 50+ Years  Completed   DEXA SCAN  Completed   Hepatitis C Screening  Completed   HPV VACCINES  Aged Out   Meningococcal B Vaccine  Aged Out   DTaP/Tdap/Td  Discontinued    Health Maintenance Items Addressed: Vaccines Due: Shingrix    Additional Screening:  Vision Screening: Recommended annual ophthalmology exams for early detection of glaucoma and other disorders of the eye. Is the patient up to date with their annual eye exam?  Yes  Who is the provider or what is the name of the office in which the patient attends annual eye exams? [facility]    Dental Screening: Recommended annual dental exams for proper oral hygiene  Community Resource Referral / Chronic Care Management: CRR required this visit?  No   CCM required this visit?  No   Plan:    I have personally reviewed and noted the following in the patient's chart:   Medical and social history Use of alcohol, tobacco or illicit drugs  Current medications and supplements including opioid prescriptions. Patient is currently taking opioid prescriptions. Information provided to patient regarding non-opioid alternatives. Patient advised to discuss non-opioid treatment plan with their provider. Functional ability and status Nutritional status Physical activity Advanced directives List of other physicians Hospitalizations, surgeries, and ER visits in previous 12 months Vitals Screenings to include cognitive, depression, and falls Referrals and appointments  In addition, I have reviewed and discussed with patient certain preventive protocols, quality metrics, and best practice recommendations. A written personalized care plan for preventive services as well as general preventive health recommendations were provided to patient.   Lavelle Pfeiffer [place], [place]   [id number]/09/[id number]   After Visit Summary: (Pick Up) Due to this being a telephonic visit, with patients personalized plan was offered to patient and patient has requested to Pick up at office.  Notes: Nothing significant to report at this time.

## 2023-11-16 MED ORDER — HYDROCODONE-ACETAMINOPHEN 5-325 MG PO TABS: 1.0000 | ORAL_TABLET | Freq: Four times a day (QID) | ORAL | 0 refills | Status: DC | PRN

## 2023-11-27 ENCOUNTER — Ambulatory Visit (INDEPENDENT_AMBULATORY_CARE_PROVIDER_SITE_OTHER)

## 2023-11-27 DIAGNOSIS — Z23 Encounter for immunization: Secondary | ICD-10-CM

## 2023-11-27 NOTE — Progress Notes (Signed)
 Patient is in office today for a nurse visit for Immunization. Patient Injection was given in the  Right deltoid. Patient tolerated injection well.

## 2023-12-02 ENCOUNTER — Other Ambulatory Visit: Payer: Self-pay | Admitting: Family Medicine

## 2023-12-02 DIAGNOSIS — J4489 Other specified chronic obstructive pulmonary disease: Secondary | ICD-10-CM

## 2023-12-07 ENCOUNTER — Other Ambulatory Visit: Payer: Self-pay | Admitting: Family Medicine

## 2023-12-07 DIAGNOSIS — M79604 Pain in right leg: Secondary | ICD-10-CM

## 2023-12-08 ENCOUNTER — Other Ambulatory Visit: Payer: Self-pay

## 2023-12-08 ENCOUNTER — Other Ambulatory Visit (HOSPITAL_COMMUNITY): Payer: Self-pay

## 2023-12-08 DIAGNOSIS — K219 Gastro-esophageal reflux disease without esophagitis: Secondary | ICD-10-CM | POA: Diagnosis not present

## 2023-12-08 DIAGNOSIS — E785 Hyperlipidemia, unspecified: Secondary | ICD-10-CM | POA: Diagnosis not present

## 2023-12-08 DIAGNOSIS — K861 Other chronic pancreatitis: Secondary | ICD-10-CM | POA: Diagnosis not present

## 2023-12-08 DIAGNOSIS — G8929 Other chronic pain: Secondary | ICD-10-CM | POA: Diagnosis not present

## 2023-12-08 DIAGNOSIS — M79604 Pain in right leg: Secondary | ICD-10-CM

## 2023-12-08 DIAGNOSIS — Z7901 Long term (current) use of anticoagulants: Secondary | ICD-10-CM | POA: Diagnosis not present

## 2023-12-08 DIAGNOSIS — E669 Obesity, unspecified: Secondary | ICD-10-CM | POA: Diagnosis not present

## 2023-12-08 DIAGNOSIS — E1169 Type 2 diabetes mellitus with other specified complication: Secondary | ICD-10-CM | POA: Diagnosis not present

## 2023-12-08 DIAGNOSIS — R110 Nausea: Secondary | ICD-10-CM | POA: Diagnosis not present

## 2023-12-08 DIAGNOSIS — J449 Chronic obstructive pulmonary disease, unspecified: Secondary | ICD-10-CM | POA: Diagnosis not present

## 2023-12-08 DIAGNOSIS — I10 Essential (primary) hypertension: Secondary | ICD-10-CM | POA: Diagnosis not present

## 2023-12-08 DIAGNOSIS — K5900 Constipation, unspecified: Secondary | ICD-10-CM | POA: Diagnosis not present

## 2023-12-08 DIAGNOSIS — I2510 Atherosclerotic heart disease of native coronary artery without angina pectoris: Secondary | ICD-10-CM | POA: Diagnosis not present

## 2023-12-08 MED ORDER — PREGABALIN 50 MG PO CAPS: 50.0000 mg | ORAL_CAPSULE | Freq: Three times a day (TID) | ORAL | 2 refills | Status: AC

## 2023-12-12 ENCOUNTER — Other Ambulatory Visit (HOSPITAL_COMMUNITY): Payer: Self-pay

## 2023-12-18 ENCOUNTER — Other Ambulatory Visit: Payer: Self-pay | Admitting: Family Medicine

## 2023-12-18 NOTE — Telephone Encounter (Unsigned)
 Copied from CRM [id number]. Topic: Clinical - Medication Refill >> Dec 18, 2023  3:16 PM Edsel HERO wrote: Medication: HYDROcodone -acetaminophen  (NORCO/VICODIN) 5-325 MG tablet  Has the patient contacted their pharmacy? Yes (Agent: If no, request that the patient contact the pharmacy for the refill. If patient does not wish to contact the pharmacy document the reason why and proceed with request.) (Agent: If yes, when and what did the pharmacy advise?)  This is the patient's preferred pharmacy:  [id number] [place], [place] - [address] [place] [address] Phone: [phone] Fax: [phone]  Is this the correct pharmacy for this prescription? Yes If no, delete pharmacy and type the correct one.   Has the prescription been filled recently? Yes  Is the patient out of the medication? Yes  Has the patient been seen for an appointment in the last year OR does the patient have an upcoming appointment? Yes  Can we respond through MyChart? Yes

## 2023-12-19 NOTE — Telephone Encounter (Signed)
 Requested medications are due for refill today.  yes  Requested medications are on the active medications list.  yes  Last refill. 11/16/2023 #130 0 rf  Future visit scheduled.   Next year  Notes to clinic.  Refill not delegated    Requested Prescriptions  Pending Prescriptions Disp Refills   HYDROcodone -acetaminophen  (NORCO/VICODIN) 5-325 MG tablet 130 tablet 0    Sig: Take 1 tablet by mouth every 6 (six) hours as needed for moderate pain (pain score 4-6). Patient needs to schedule office visit     Not Delegated - Analgesics:  Opioid Agonist Combinations Failed - 12/19/2023  5:41 PM      Failed - This refill cannot be delegated      Failed - Urine Drug Screen completed in last 360 days      Passed - Valid encounter within last 3 months    Recent Outpatient Visits           1 month ago Leg swelling   [place] [facility] Duanne Butler DASEN, MD   2 months ago Nausea and vomiting, unspecified vomiting type   [place] [facility] Medicine Duanne Butler DASEN, MD   5 months ago Type 2 diabetes mellitus with other circulatory complication, without long-term current use of insulin  [facility])   [place] [facility] Medicine Duanne Butler DASEN, MD   8 months ago Type 2 diabetes mellitus with other circulatory complication, without long-term current use of insulin  [facility])   [place] [facility] Duanne Butler DASEN, MD   8 months ago Acute cough   [place] [facility] Medicine Pickard, Butler DASEN, MD

## 2023-12-20 ENCOUNTER — Other Ambulatory Visit: Payer: Self-pay | Admitting: Family Medicine

## 2023-12-20 NOTE — Telephone Encounter (Unsigned)
 Copied from CRM [id number]. Topic: Clinical - Medication Refill >> Dec 20, 2023  3:42 PM Lonell PEDLAR wrote: Medication: FARXIGA  10 MG TABS tablet  Has the patient contacted their pharmacy? Yes  This is the patient's preferred pharmacy:  [id number] [place], [place] - [address] [place] [address] Phone: [phone] Fax: [phone]  Is this the correct pharmacy for this prescription? Yes If no, delete pharmacy and type the correct one.   Has the prescription been filled recently? Yes  Is the patient out of the medication? No  Has the patient been seen for an appointment in the last year OR does the patient have an upcoming appointment? Yes  Can we respond through MyChart? No  Agent: Please be advised that Rx refills may take up to 3 business days. We ask that you follow-up with your pharmacy.

## 2023-12-21 MED ORDER — HYDROCODONE-ACETAMINOPHEN 5-325 MG PO TABS: 1.0000 | ORAL_TABLET | Freq: Four times a day (QID) | ORAL | 0 refills | Status: DC | PRN

## 2023-12-21 MED ORDER — DAPAGLIFLOZIN PROPANEDIOL 10 MG PO TABS: 10.0000 mg | ORAL_TABLET | Freq: Every day | ORAL | 0 refills | Status: DC

## 2023-12-21 NOTE — Telephone Encounter (Signed)
 Requested Prescriptions  Pending Prescriptions Disp Refills   dapagliflozin  propanediol (FARXIGA ) 10 MG TABS tablet 90 tablet 0    Sig: Take 1 tablet (10 mg total) by mouth daily.     Endocrinology:  Diabetes - SGLT2 Inhibitors Passed - 12/21/2023  5:35 PM      Passed - Cr in normal range and within 360 days    Creat  Date Value Ref Range Status  07/07/2023 0.99 0.50 - 1.05 mg/dL Final   Creatinine, Ser  Date Value Ref Range Status  09/26/2023 0.98 0.57 - 1.00 mg/dL Final   Creatinine, Urine  Date Value Ref Range Status  07/07/2023 54 20 - 275 mg/dL Final         Passed - HBA1C is between 0 and 7.9 and within 180 days    Hgb A1c MFr Bld  Date Value Ref Range Status  07/07/2023 7.3 (H) <5.7 % Final    Comment:    For someone without known diabetes, a hemoglobin A1c value of 6.5% or greater indicates that they may have  diabetes and this should be confirmed with a follow-up  test. . For someone with known diabetes, a value <7% indicates  that their diabetes is well controlled and a value  greater than or equal to 7% indicates suboptimal  control. A1c targets should be individualized based on  duration of diabetes, age, comorbid conditions, and  other considerations. . Currently, no consensus exists regarding use of hemoglobin A1c for diagnosis of diabetes for children. .          Passed - eGFR in normal range and within 360 days    GFR, Est African American  Date Value Ref Range Status  04/03/2020 69 > OR = 60 mL/min/1.29m2 Final   GFR, Est Non African American  Date Value Ref Range Status  04/03/2020 59 (L) > OR = 60 mL/min/1.98m2 Final   GFR, Estimated  Date Value Ref Range Status  06/30/2021 58 (L) >60 mL/min Final    Comment:    (NOTE) Calculated using the CKD-EPI Creatinine Equation (2021)    GFR  Date Value Ref Range Status  12/07/2017 59.28 (L) >60.00 mL/min Final   eGFR  Date Value Ref Range Status  09/26/2023 64 >59 mL/min/1.73 Final          Passed - Valid encounter within last 6 months    Recent Outpatient Visits           1 month ago Leg swelling   [place] [facility] Medicine Duanne Butler DASEN, MD   2 months ago Nausea and vomiting, unspecified vomiting type   [place] [facility] Medicine Duanne Butler DASEN, MD   5 months ago Type 2 diabetes mellitus with other circulatory complication, without long-term current use of insulin  [facility])   [place] [facility] Medicine Duanne Butler DASEN, MD   8 months ago Type 2 diabetes mellitus with other circulatory complication, without long-term current use of insulin  [facility])   [place] [facility] Medicine Duanne Butler DASEN, MD   8 months ago Acute cough   [place] [facility] Family Medicine Pickard, Butler DASEN, MD

## 2023-12-22 ENCOUNTER — Other Ambulatory Visit: Payer: Self-pay | Admitting: Family Medicine

## 2023-12-23 NOTE — Telephone Encounter (Signed)
 Discontinued 10/04/23, not on current list.  Requested Prescriptions  Pending Prescriptions Disp Refills   promethazine -dextromethorphan (PROMETHAZINE -DM) 6.25-15 MG/5ML syrup [Pharmacy Med Name: PROMETHAZINE -DM 6.25-15 MG/5ML] 118 mL 0    Sig: TAKE 5 MLS BY MOUTH 4 TIMES A DAY AS NEEDED FOR COUGH     Ear, Nose, and Throat:  Antitussives/Expectorants Passed - 12/23/2023  9:00 AM      Passed - Valid encounter within last 12 months    Recent Outpatient Visits           2 months ago Leg swelling   [place] [facility] Duanne Butler DASEN, MD   2 months ago Nausea and vomiting, unspecified vomiting type   [place] [facility] Medicine Duanne Butler DASEN, MD   5 months ago Type 2 diabetes mellitus with other circulatory complication, without long-term current use of insulin  [facility])   [place] [facility] Family Medicine Duanne Butler DASEN, MD   8 months ago Type 2 diabetes mellitus with other circulatory complication, without long-term current use of insulin  [facility])   [facility] Family Medicine Pickard, Butler DASEN, MD   8 months ago Acute cough   [place] [facility] Family Medicine Pickard, Butler DASEN, MD               levofloxacin  (LEVAQUIN ) 500 MG tablet [Pharmacy Med Name: LEVOFLOXACIN  500 MG TABLET] 7 tablet 0    Sig: TAKE 1 TABLET BY MOUTH EVERY DAY FOR 7 DAYS     Off-Protocol Failed - 12/23/2023  9:00 AM      Failed - Medication not assigned to a protocol, review manually.      Passed - Valid encounter within last 12 months    Recent Outpatient Visits           2 months ago Leg swelling   [place] [facility] Medicine Duanne Butler DASEN, MD   2 months ago Nausea and vomiting, unspecified vomiting type   [place] [facility] Medicine Duanne Butler DASEN, MD   5 months ago Type 2 diabetes mellitus with other circulatory complication, without long-term current use of insulin  [facility])   [place] [facility] Family  Medicine Duanne Butler DASEN, MD   8 months ago Type 2 diabetes mellitus with other circulatory complication, without long-term current use of insulin  [facility])   [place] [facility] Medicine Duanne Butler DASEN, MD   8 months ago Acute cough    [facility] Medicine Pickard, Butler DASEN, MD

## 2024-01-14 ENCOUNTER — Other Ambulatory Visit: Payer: Self-pay | Admitting: Family Medicine

## 2024-01-14 DIAGNOSIS — K219 Gastro-esophageal reflux disease without esophagitis: Secondary | ICD-10-CM

## 2024-01-15 ENCOUNTER — Telehealth: Payer: Self-pay

## 2024-01-15 ENCOUNTER — Telehealth: Payer: Self-pay | Admitting: Family Medicine

## 2024-01-15 DIAGNOSIS — E1159 Type 2 diabetes mellitus with other circulatory complications: Secondary | ICD-10-CM

## 2024-01-15 NOTE — Telephone Encounter (Signed)
 Copied from CRM #[id number]. Topic: Clinical - Medication Refill >> Jan 15, 2024  2:56 PM Charlet HERO wrote: Medication: HYDROcodone -acetaminophen  (NORCO/VICODIN) 5-325 MG tablet  Has the patient contacted their pharmacy? Yes 0 refills  This is the patient's preferred pharmacy:  [id number] [place], [place] - [address] [place] [address] Phone: [phone] Fax: [phone]  Is this the correct pharmacy for this prescription? Yes If no, delete pharmacy and type the correct one.   Has the prescription been filled recently? Yes  Is the patient out of the medication? No  Has the patient been seen for an appointment in the last year OR does the patient have an upcoming appointment? Yes  Can we respond through MyChart? No  Agent: Please be advised that Rx refills may take up to 3 business days. We ask that you follow-up with your pharmacy.

## 2024-01-15 NOTE — Telephone Encounter (Signed)
 Prescription Request  01/15/2024  LOV: 10/24/23  What is the name of the medication or equipment? metFORMIN  (GLUCOPHAGE ) 1000 MG tablet [[id number]]   Have you contacted your pharmacy to request a refill? Yes   Which pharmacy would you like this sent to?  [id number] [address] [place], [place] - [address] [place] [address] Phone: [phone] Fax: [phone]    Patient notified that their request is being sent to the clinical staff for review and that they should receive a response within 2 business days.   Please advise at [facility] [phone]

## 2024-01-18 ENCOUNTER — Other Ambulatory Visit: Payer: Self-pay | Admitting: Family Medicine

## 2024-01-18 ENCOUNTER — Telehealth: Payer: Self-pay

## 2024-01-18 MED ORDER — HYDROCODONE-ACETAMINOPHEN 5-325 MG PO TABS: 1.0000 | ORAL_TABLET | Freq: Four times a day (QID) | ORAL | 0 refills | Status: DC | PRN

## 2024-01-18 MED ORDER — METFORMIN HCL 1000 MG PO TABS: 1000.0000 mg | ORAL_TABLET | Freq: Two times a day (BID) | ORAL | 0 refills | Status: AC

## 2024-01-18 NOTE — Telephone Encounter (Signed)
 Requested Prescriptions  Pending Prescriptions Disp Refills   metFORMIN  (GLUCOPHAGE ) 1000 MG tablet 180 tablet 0    Sig: Take 1 tablet (1,000 mg total) by mouth 2 (two) times daily with a meal.     Endocrinology:  Diabetes - Biguanides Failed - 01/18/2024  1:40 PM      Failed - HBA1C is between 0 and 7.9 and within 180 days    Hgb A1c MFr Bld  Date Value Ref Range Status  07/07/2023 7.3 (H) <5.7 % Final    Comment:    For someone without known diabetes, a hemoglobin A1c value of 6.5% or greater indicates that they may have  diabetes and this should be confirmed with a follow-up  test. . For someone with known diabetes, a value <7% indicates  that their diabetes is well controlled and a value  greater than or equal to 7% indicates suboptimal  control. A1c targets should be individualized based on  duration of diabetes, age, comorbid conditions, and  other considerations. . Currently, no consensus exists regarding use of hemoglobin A1c for diagnosis of diabetes for children. .          Failed - B12 Level in normal range and within 720 days    Vitamin B-12  Date Value Ref Range Status  05/08/2020 313 200 - 1,100 pg/mL Final    Comment:    . Please Note: Although the reference range for vitamin B12 is [phone] pg/mL, it has been reported that between 5 and 10% of patients with values between 200 and 400 pg/mL may experience neuropsychiatric and hematologic abnormalities due to occult B12 deficiency; less than 1% of patients with values above 400 pg/mL will have symptoms. .          Passed - Cr in normal range and within 360 days    Creat  Date Value Ref Range Status  07/07/2023 0.99 0.50 - 1.05 mg/dL Final   Creatinine, Ser  Date Value Ref Range Status  09/26/2023 0.98 0.57 - 1.00 mg/dL Final   Creatinine, Urine  Date Value Ref Range Status  07/07/2023 54 20 - 275 mg/dL Final         Passed - eGFR in normal range and within 360 days    GFR, Est African American   Date Value Ref Range Status  04/03/2020 69 > OR = 60 mL/min/1.67m2 Final   GFR, Est Non African American  Date Value Ref Range Status  04/03/2020 59 (L) > OR = 60 mL/min/1.7m2 Final   GFR, Estimated  Date Value Ref Range Status  06/30/2021 58 (L) >60 mL/min Final    Comment:    (NOTE) Calculated using the CKD-EPI Creatinine Equation (2021)    GFR  Date Value Ref Range Status  12/07/2017 59.28 (L) >60.00 mL/min Final   eGFR  Date Value Ref Range Status  09/26/2023 64 >59 mL/min/1.73 Final         Passed - Valid encounter within last 6 months    Recent Outpatient Visits           2 months ago Leg swelling   [place] [facility] Duanne Butler DASEN, MD   3 months ago Nausea and vomiting, unspecified vomiting type   [place] [facility] Medicine Duanne Butler DASEN, MD   6 months ago Type 2 diabetes mellitus with other circulatory complication, without long-term current use of insulin  [facility])   [facility] Pickard, Butler DASEN, MD  9 months ago Type 2 diabetes mellitus with other circulatory complication, without long-term current use of insulin  [facility])   [facility] Medicine Pickard, Butler DASEN, MD   9 months ago Acute cough   [place] [facility] Medicine Pickard, Butler DASEN, MD              Passed - CBC within normal limits and completed in the last 12 months    WBC  Date Value Ref Range Status  09/26/2023 5.4 3.4 - 10.8 x10E3/uL Final  07/07/2023 5.7 3.8 - 10.8 Thousand/uL Final   RBC  Date Value Ref Range Status  09/26/2023 4.52 3.77 - 5.28 x10E6/uL Final  07/07/2023 4.28 3.80 - 5.10 Million/uL Final   Hemoglobin  Date Value Ref Range Status  09/26/2023 12.4 11.1 - 15.9 g/dL Final   Hematocrit  Date Value Ref Range Status  09/26/2023 40.0 34.0 - 46.6 % Final   MCHC  Date Value Ref Range Status  09/26/2023 31.0 (L) 31.5 - 35.7 g/dL Final  94/76/7974 69.1 (L) 32.0 - 36.0  g/dL Final    Comment:    For adults, a slight decrease in the calculated MCHC value (in the range of 30 to 32 g/dL) is most likely not clinically significant; however, it should be interpreted with caution in correlation with other red cell parameters and the patient's clinical condition.    [facility]  Date Value Ref Range Status  09/26/2023 27.4 26.6 - 33.0 pg Final  07/07/2023 26.6 (L) 27.0 - 33.0 pg Final   MCV  Date Value Ref Range Status  09/26/2023 89 79 - 97 fL Final   No results found for: PLTCOUNTKUC, LABPLAT, POCPLA RDW  Date Value Ref Range Status  09/26/2023 14.0 11.7 - 15.4 % Final

## 2024-01-18 NOTE — Telephone Encounter (Signed)
 Copied from CRM #[id number]. Topic: Clinical - Medication Refill >> Jan 15, 2024  2:56 PM Charlet HERO wrote: Medication: HYDROcodone -acetaminophen  (NORCO/VICODIN) 5-325 MG tablet  Has the patient contacted their pharmacy? Yes 0 refills  This is the patient's preferred pharmacy:  [id number] [place], [place] - [address] [place] [address] Phone: [phone] Fax: [phone]  Is this the correct pharmacy for this prescription? Yes If no, delete pharmacy and type the correct one.   Has the prescription been filled recently? Yes  Is the patient out of the medication? No  Has the patient been seen for an appointment in the last year OR does the patient have an upcoming appointment? Yes  Can we respond through MyChart? No  Agent: Please be advised that Rx refills may take up to 3 business days. We ask that you follow-up with your pharmacy. >> Jan 18, 2024  4:29 PM Delon B wrote: Message already routed to provider's  nurse.  >> Jan 18, 2024  2:11 PM Montie POUR wrote: [name] is calling to check on refill for the above. Chart states:  01/16/24 1541 Reorder Ulla Aquas, RN To [phone]  AND Sig - Route: Take 1 tablet by mouth every 6 (six) hours as needed for moderate pain (pain score 4-6). Patient needs to schedule office visit - Oral   Sent to pharmacy as: HYDROcodone -acetaminophen  (NORCO/VICODIN) 5-325 MG tablet   Earliest Fill Date: 12/21/2023   E-Prescribing Status: Receipt confirmed by pharmacy (12/21/2023  6:30 AM EST)   Please call [phone] with questions. She will out of medication this Saturday.

## 2024-01-18 NOTE — Telephone Encounter (Signed)
 Requested medications are due for refill today.  yes  Requested medications are on the active medications list.  yes  Last refill. 12/21/2023 #130 0 rf  Future visit scheduled.   Yes - next year  Notes to clinic.  Refill not delegated.    Requested Prescriptions  Pending Prescriptions Disp Refills   HYDROcodone -acetaminophen  (NORCO/VICODIN) 5-325 MG tablet 130 tablet 0    Sig: Take 1 tablet by mouth every 6 (six) hours as needed for moderate pain (pain score 4-6). Patient needs to schedule office visit     Not Delegated - Analgesics:  Opioid Agonist Combinations Failed - 01/18/2024  2:39 PM      Failed - This refill cannot be delegated      Failed - Urine Drug Screen completed in last 360 days      Passed - Valid encounter within last 3 months    Recent Outpatient Visits           2 months ago Leg swelling   [place] Medicine Duanne Butler DASEN, MD   3 months ago Nausea and vomiting, unspecified vomiting type   [place] [facility] Medicine Duanne Butler DASEN, MD   6 months ago Type 2 diabetes mellitus with other circulatory complication, without long-term current use of insulin  [facility])   [facility] Duanne Butler DASEN, MD   9 months ago Type 2 diabetes mellitus with other circulatory complication, without long-term current use of insulin  [facility])   [place] [facility] Family Medicine Pickard, Butler DASEN, MD   9 months ago Acute cough   [place] [facility] Family Medicine Pickard, Butler DASEN, MD

## 2024-01-21 ENCOUNTER — Other Ambulatory Visit: Payer: Self-pay | Admitting: Family Medicine

## 2024-01-21 DIAGNOSIS — I25118 Atherosclerotic heart disease of native coronary artery with other forms of angina pectoris: Secondary | ICD-10-CM

## 2024-01-21 DIAGNOSIS — E1159 Type 2 diabetes mellitus with other circulatory complications: Secondary | ICD-10-CM

## 2024-01-22 ENCOUNTER — Telehealth: Payer: Self-pay

## 2024-01-22 ENCOUNTER — Other Ambulatory Visit: Payer: Self-pay

## 2024-01-22 DIAGNOSIS — E1159 Type 2 diabetes mellitus with other circulatory complications: Secondary | ICD-10-CM

## 2024-01-22 DIAGNOSIS — I10 Essential (primary) hypertension: Secondary | ICD-10-CM

## 2024-01-22 DIAGNOSIS — I701 Atherosclerosis of renal artery: Secondary | ICD-10-CM

## 2024-01-22 MED ORDER — BISOPROLOL FUMARATE 5 MG PO TABS: ORAL_TABLET | ORAL | 1 refills | Status: AC

## 2024-01-22 NOTE — Telephone Encounter (Signed)
 Sent in medication

## 2024-01-22 NOTE — Telephone Encounter (Signed)
 Prescription Request  01/22/2024  LOV: 10/24/23  What is the name of the medication or equipment? bisoprolol  (ZEBETA ) 5 MG tablet [[id number]]   Have you contacted your pharmacy to request a refill? Yes   Which pharmacy would you like this sent to?  [id number] [place] [place], [place] - [address] [place] [address] Phone: [phone] Fax: [phone]    Patient notified that their request is being sent to the clinical staff for review and that they should receive a response within 2 business days.   Please advise at [facility] [phone]

## 2024-02-02 ENCOUNTER — Other Ambulatory Visit: Payer: Self-pay

## 2024-02-02 ENCOUNTER — Telehealth: Payer: Self-pay

## 2024-02-02 MED ORDER — PROMETHAZINE HCL 25 MG PO TABS: ORAL_TABLET | ORAL | 2 refills | Status: AC

## 2024-02-02 NOTE — Telephone Encounter (Signed)
 Prescription Request  02/02/2024  LOV: 10/24/23  What is the name of the medication or equipment? promethazine  (PHENERGAN ) 25 MG tablet [[id number]]   Have you contacted your pharmacy to request a refill? Yes   Which pharmacy would you like this sent to?  [id number] [address] [place], [place] - [address] [place] [address] Phone: [phone] Fax: [phone]    Patient notified that their request is being sent to the clinical staff for review and that they should receive a response within 2 business days.   Please advise at [facility] [phone]  Prescription Request  02/02/2024  LOV: 10/24/23  What is the name of the medication or equipment? lipase/protease/amylase (CREON ) 36000 UNITS CPEP capsule [[id number]]   Have you contacted your pharmacy to request a refill? Yes   Which pharmacy would you like this sent to?  [id number] [address] [place], [place] - [address] [place] [address] Phone: [phone] Fax: [phone]    Patient notified that their request is being sent to the clinical staff for review and that they should receive a response within 2 business days.   Please advise at [facility] [phone]'

## 2024-02-06 ENCOUNTER — Other Ambulatory Visit: Payer: Self-pay

## 2024-02-06 DIAGNOSIS — K861 Other chronic pancreatitis: Secondary | ICD-10-CM

## 2024-02-06 NOTE — Telephone Encounter (Signed)
 2cd refill request: Prescription Request  02/06/2024  LOV: 10/24/23  What is the name of the medication or equipment? lipase/protease/amylase (CREON ) 36000 UNITS CPEP capsule [[id number]]   Have you contacted your pharmacy to request a refill? Yes   Which pharmacy would you like this sent to?  [id number] [address] [place], [place] - [address] [facility] [address] [facility] [address] [place] [address] Phone: [phone] Fax: [phone]    Patient notified that their request is being sent to the clinical staff for review and that they should receive a response within 2 business days.   Please advise at [facility] [phone]

## 2024-02-07 NOTE — Telephone Encounter (Signed)
 Requested medication (s) are due for refill today - yes  Requested medication (s) are on the active medication list -yes  Future visit scheduled -no  Last refill: 08/25/23 #200 3RF  Notes to clinic: Patient may not be getting a monthly supply- off protocol- provider review   Requested Prescriptions  Pending Prescriptions Disp Refills   lipase/protease/amylase (CREON ) 36000 UNITS CPEP capsule 200 capsule 3    Sig: Take 3 capsules (108,000 Units total) by mouth 3 (three) times daily with meals. May also take 1 capsule (36,000 Units total) as needed (with snacks).     Off-Protocol Failed - 02/07/2024  3:24 PM      Failed - Medication not assigned to a protocol, review manually.      Passed - Valid encounter within last 12 months    Recent Outpatient Visits           3 months ago Leg swelling   [place] [facility] Duanne Butler DASEN, MD   3 months ago Nausea and vomiting, unspecified vomiting type   [place] [facility] Duanne Butler DASEN, MD   6 months ago Type 2 diabetes mellitus with other circulatory complication, without long-term current use of insulin  [facility])   [place] [facility] Duanne Butler DASEN, MD   10 months ago Type 2 diabetes mellitus with other circulatory complication, without long-term current use of insulin  [facility])   [place] [facility] Pickard, Butler DASEN, MD   10 months ago Acute cough   [place] [facility] Pickard, Butler DASEN, MD       Future Appointments             In 2 weeks Darron, Deatrice LABOR, MD [facility] at [facility] A Dept of [facility] [place], H&V               Requested Prescriptions  Pending Prescriptions Disp Refills   lipase/protease/amylase (CREON ) 36000 UNITS CPEP capsule 200 capsule 3    Sig: Take 3 capsules (108,000 Units total) by mouth 3 (three) times daily with meals. May also take 1 capsule (36,000 Units total) as needed (with  snacks).     Off-Protocol Failed - 02/07/2024  3:24 PM      Failed - Medication not assigned to a protocol, review manually.      Passed - Valid encounter within last 12 months    Recent Outpatient Visits           3 months ago Leg swelling   [place] [facility] Duanne Butler DASEN, MD   3 months ago Nausea and vomiting, unspecified vomiting type   [place] [facility] Duanne Butler DASEN, MD   6 months ago Type 2 diabetes mellitus with other circulatory complication, without long-term current use of insulin  [facility])   [place] [facility] Duanne Butler DASEN, MD   10 months ago Type 2 diabetes mellitus with other circulatory complication, without long-term current use of insulin  [facility])   [place] [facility] Pickard, Butler DASEN, MD   10 months ago Acute cough    [facility] Pickard, Butler DASEN, MD       Future Appointments             In 2 weeks Darron, Deatrice LABOR, MD [facility]Care at [facility] A Dept of [facility] [place]. [facility],  H&V

## 2024-02-08 ENCOUNTER — Other Ambulatory Visit: Payer: Self-pay | Admitting: Family Medicine

## 2024-02-08 DIAGNOSIS — E1159 Type 2 diabetes mellitus with other circulatory complications: Secondary | ICD-10-CM

## 2024-02-09 MED ORDER — PANCRELIPASE (LIP-PROT-AMYL) 36000-114000 UNITS PO CPEP: 108000.0000 [IU] | ORAL_CAPSULE | Freq: Three times a day (TID) | ORAL | 3 refills | Status: AC

## 2024-02-12 ENCOUNTER — Telehealth: Payer: Self-pay

## 2024-02-12 ENCOUNTER — Other Ambulatory Visit: Payer: Self-pay

## 2024-02-12 DIAGNOSIS — E1159 Type 2 diabetes mellitus with other circulatory complications: Secondary | ICD-10-CM

## 2024-02-12 MED ORDER — ONETOUCH ULTRA TEST VI STRP: ORAL_STRIP | 1 refills | Status: DC

## 2024-02-12 NOTE — Telephone Encounter (Signed)
 Prescription Request  02/12/2024  LOV: 10/24/23  What is the name of the medication or equipment? ONETOUCH ULTRA TEST test strip [[id number]]  Have you contacted your pharmacy to request a refill? Yes   Which pharmacy would you like this sent to?  [id number] [place], [place] - [address] [place] [address] Phone: [phone] Fax: [phone]    Patient notified that their request is being sent to the clinical staff for review and that they should receive a response within 2 business days.   Please advise at [facility] [phone]

## 2024-02-12 NOTE — Telephone Encounter (Signed)
 Sent in medication

## 2024-02-19 ENCOUNTER — Other Ambulatory Visit: Payer: Self-pay | Admitting: Family Medicine

## 2024-02-19 NOTE — Telephone Encounter (Signed)
 Copied from CRM #[id number]. Topic: Clinical - Medication Refill >> Feb 19, 2024  2:23 PM Tiffini S wrote: Medication: HYDROcodone -acetaminophen  (NORCO/VICODIN) 5-325 MG tablet  Has the patient contacted their pharmacy? Yes (Agent: If no, request that the patient contact the pharmacy for the refill. If patient does not wish to contact the pharmacy document the reason why and proceed with request.) (Agent: If yes, when and what did the pharmacy advise?)  This is the patient's preferred pharmacy:  [id number] [place], [place] - [address] [place] [address] Phone: [phone] Fax: [phone]  Is this the correct pharmacy for this prescription? Yes If no, delete pharmacy and type the correct one.   Has the prescription been filled recently? Yes  Is the patient out of the medication? No  Has the patient been seen for an appointment in the last year OR does the patient have an upcoming appointment? Yes  Can we respond through MyChart? No, please call [phone]  Agent: Please be advised that Rx refills may take up to 3 business days. We ask that you follow-up with your pharmacy.

## 2024-02-20 NOTE — Telephone Encounter (Signed)
 Requested medications are due for refill today.  yes  Requested medications are on the active medications list.  yes  Last refill. 01/18/2024 #130 0 rf  Future visit scheduled.   Yes 11/2024  Notes to clinic.  Refill not delegated.    Requested Prescriptions  Pending Prescriptions Disp Refills   HYDROcodone -acetaminophen  (NORCO/VICODIN) 5-325 MG tablet 130 tablet 0    Sig: Take 1 tablet by mouth every 6 (six) hours as needed for moderate pain (pain score 4-6). Patient needs to schedule office visit     Not Delegated - Analgesics:  Opioid Agonist Combinations Failed - 02/20/2024  5:29 PM      Failed - This refill cannot be delegated      Failed - Urine Drug Screen completed in last 360 days      Failed - Valid encounter within last 3 months    Recent Outpatient Visits           3 months ago Leg swelling   [place] [facility] Duanne Butler DASEN, MD   4 months ago Nausea and vomiting, unspecified vomiting type   [place] Duanne Butler DASEN, MD   7 months ago Type 2 diabetes mellitus with other circulatory complication, without long-term current use of insulin  [facility])   [place] [facility] Duanne Butler DASEN, MD   10 months ago Type 2 diabetes mellitus with other circulatory complication, without long-term current use of insulin  [facility])   [place] [facility] Pickard, Butler DASEN, MD   10 months ago Acute cough    [facility] Pickard, Butler DASEN, MD       Future Appointments             In 1 week Darron, Deatrice LABOR, MD [facility] HeartCare at [facility] A Dept of [facility] [place] [facility], H&V

## 2024-02-22 MED ORDER — HYDROCODONE-ACETAMINOPHEN 5-325 MG PO TABS: 1.0000 | ORAL_TABLET | Freq: Four times a day (QID) | ORAL | 0 refills | Status: DC | PRN

## 2024-02-27 ENCOUNTER — Ambulatory Visit: Attending: Cardiovascular Disease | Admitting: Cardiovascular Disease

## 2024-02-27 ENCOUNTER — Encounter: Payer: Self-pay | Admitting: Cardiovascular Disease

## 2024-02-27 VITALS — BP 106/56 | HR 83 | Ht 62.0 in | Wt 179.0 lb

## 2024-02-27 DIAGNOSIS — E785 Hyperlipidemia, unspecified: Secondary | ICD-10-CM | POA: Diagnosis not present

## 2024-02-27 DIAGNOSIS — I25118 Atherosclerotic heart disease of native coronary artery with other forms of angina pectoris: Secondary | ICD-10-CM | POA: Diagnosis not present

## 2024-02-27 DIAGNOSIS — I10 Essential (primary) hypertension: Secondary | ICD-10-CM

## 2024-02-27 DIAGNOSIS — I739 Peripheral vascular disease, unspecified: Secondary | ICD-10-CM | POA: Diagnosis not present

## 2024-02-27 NOTE — Progress Notes (Signed)
 "    Cardiology Office Note   Date:  02/27/2024   ID:  [name], DOB [birth year], MRN [id number]  PCP:  Duanne Butler DASEN, MD  Cardiologist:  Darron Johns chief complaint on file.      History of Present Illness: [name] is a 69 y.o. female who presents for a follow up visit regarding regarding peripheral arterial disease and coronary artery disease. She has known history of peripheral arterial disease status post atherectomy of the right SFA in 2011 by Dr. Barbette. She has other chronic medical conditions including CAD, DM, HTN, chronic back pain, recurrent idiopathic pancreatitis and hyperlipidemia. She is a former smoker.  She is s/p bilateral TKR.   She had worsening right leg claudication in April 2017. Angiography showed no significant aortoiliac disease. There was moderate right common femoral artery stenosis and severe discrete stenosis in the proximal right SFA with three-vessel runoff below the knee. There was no significant obstructive disease involving the left lower extremity. A self-expanding stent was placed to the proximal right SFA. She had previous bilateral knee replacement and chronic bilateral leg pain.   She had worsening left leg claudication in 2021.  Vascular studies showed mildly reduced ABI bilaterally in the 0.8 range. Angiography was done in March of 2021 which showed borderline stenosis affecting the left common iliac artery, severe calcified stenosis affecting the left proximal SFA and three-vessel runoff below the knee.  On the right, there was severe calcified disease affecting the common femoral artery with patent proximal SFA stent followed by 60% disease throughout the whole SFA and three-vessel runoff below the knee.  I performed successful orbital atherectomy and drug-coated balloon angioplasty to the left SFA.   She underwent staged right common femoral artery endarterectomy by Dr. Gretta in August of 2021.  She had worsening right  calf claudication in 2024.  She did not tolerate cilostazol  due to GI symptoms and headache. Angiography was performed in August 2024  which showed severe diffuse and calcified disease affecting the whole length of the right SFA and proximal popliteal artery with three-vessel runoff below the knee.  There was mild disease on the left side.  I performed intravascular lithotripsy and drug-coated balloon angioplasty to the right SFA.  Postprocedure ABI improved to normal.  She had follow-up Doppler studies in June 2024 which showed an ABI of 0.83 bilaterally. Duplex showed significant restenosis in the right proximal popliteal artery.  On the left, there was moderate SFA disease.  She reports stable bilateral calf claudication.  She was seen in August for atypical chest pain but symptoms worrisome for angina.  Cardiac catheterization was performed on August 21 which showed heavily calcified coronary arteries with moderate two-vessel coronary artery disease, normal left ventricular systolic function and normal left ventricular end-diastolic pressure.  Medical therapy was recommended.  She reports stable chest pain overall which mainly happens if she is under significant stress.  She has been under increased stress lately due to her husband's health issues.  She continues to have significant bilateral calf claudication but no rest pain or lower extremity ulceration.   Past Medical History:  Diagnosis Date   Aortic atherosclerosis    Asthma    CAD (coronary artery disease)    a. LHC 2011: Diffuse distal and branch vessel disease - patient managed medically, no interventional options. b. Nuc 03/2014 - low risk, no ischemia.   Complication of anesthesia    slow to wake up with last surgery in 2011  Diabetes mellitus with circulatory complication (HCC)    Diverticulosis    Dyslipidemia    GERD (gastroesophageal reflux disease)    Headache    hx of migraines    Hypertension    Osteoarthritis     severe right knee  R TKR   PAD (peripheral artery disease)    a. Severe stenosis mid right SFA s/p atherectomy 03/25/09. b. peripheral angiography in 12/2011 which showed only about 50% diffuse right SFA stenosis.   Pancreatitis 2003   Renal artery stenosis    a. LHC 2011 - 40-50% left RAS.   Tobacco use disorder    quit 11/10    Past Surgical History:  Procedure Laterality Date   ABDOMINAL AORTAGRAM N/A 12/21/2011   Procedure: ABDOMINAL AORTAGRAM;  Surgeon: Deatrice DELENA Cage, MD;  [facility]: [facility]ATH [facility];  Service: Cardiovascular;  Laterality: N/A;   ABDOMINAL AORTOGRAM W/LOWER EXTREMITY Bilateral 05/08/2019   Procedure: ABDOMINAL AORTOGRAM W/LOWER EXTREMITY;  Surgeon: Cage Deatrice DELENA, MD;  Location: [facility] INVASIVE CV LAB;  Service: Cardiovascular;  Laterality: Bilateral;   ABDOMINAL AORTOGRAM W/LOWER EXTREMITY N/A 09/28/2022   Procedure: ABDOMINAL AORTOGRAM W/LOWER EXTREMITY;  Surgeon: Cage Deatrice DELENA, MD;  Location: [facility]VASIVE CV LAB;  Service: Cardiovascular;  Laterality: N/A;   CARDIAC CATHETERIZATION     ENDARTERECTOMY FEMORAL Right 10/10/2019   Procedure: RIGHT FEMORAL ENDARTERECTOMY;  Surgeon: Gretta Lonni PARAS, MD;  Location: [facility] OR;  Service: Vascular;  Laterality: Right;   ESOPHAGOGASTRODUODENOSCOPY (EGD) WITH PROPOFOL  N/A 10/05/2017   Procedure: ESOPHAGOGASTRODUODENOSCOPY (EGD) WITH PROPOFOL ;  Surgeon: Teressa Toribio SQUIBB, MD;  Location: [facility] ENDOSCOPY;  Service: Endoscopy;  Laterality: N/A;   EUS N/A 10/05/2017   Procedure: UPPER ENDOSCOPIC ULTRASOUND (EUS) RADIAL;  Surgeon: Teressa Toribio SQUIBB, MD;  Location: [facility]OS[facility];  Service: Endoscopy;  Laterality: N/A;   FOOT SURGERY Left    LEFT HEART CATH AND CORONARY ANGIOGRAPHY N/A 10/05/2023   Procedure: LEFT HEART CATH AND CORONARY ANGIOGRAPHY;  Surgeon: Cage Deatrice DELENA, MD;  [facility];  Service: Cardiovascular;  Laterality: N/A;   LOWER EXTREMITY ANGIOGRAM Bilateral 05/27/2015   Procedure: Lower Extremity Angiogram;   Surgeon: Deatrice DELENA Cage, MD;  [facility];  Service: Cardiovascular;  Laterality: Bilateral;   PATCH ANGIOPLASTY Right 10/10/2019   Procedure: PATCH ANGIOPLASTY USING GEORGE BIOLOGIC PATCH;  Surgeon: Gretta Lonni PARAS, MD;  [facility] OR;  Service: Vascular;  Laterality: Right;   PERIPHERAL INTRAVASCULAR LITHOTRIPSY  09/28/2022   Procedure: PERIPHERAL INTRAVASCULAR LITHOTRIPSY;  Surgeon: Cage Deatrice DELENA, MD;  [facility];  Service: Cardiovascular;;   PERIPHERAL VASCULAR ATHERECTOMY Left 05/08/2019   Procedure: PERIPHERAL VASCULAR ATHERECTOMY;  Surgeon: Cage Deatrice DELENA, MD;  [facility];  Service: Cardiovascular;  Laterality: Left;   PERIPHERAL VASCULAR CATHETERIZATION N/A 05/27/2015   Procedure: Abdominal Aortogram;  Surgeon: Deatrice DELENA Cage, MD;  [facility];  Service: Cardiovascular;  Laterality: N/A;   PERIPHERAL VASCULAR CATHETERIZATION Right 05/27/2015   Procedure: Peripheral Vascular Intervention;  Surgeon: Deatrice DELENA Cage, MD;  [facility];  Service: Cardiovascular;  Laterality: Right;  SFA   PERIPHERAL VASCULAR INTERVENTION  09/28/2022   Procedure: PERIPHERAL VASCULAR INTERVENTION;  Surgeon: Cage Deatrice DELENA, MD;  [facility];  Service: Cardiovascular;;   TOTAL KNEE ARTHROPLASTY Right 2011   TOTAL KNEE ARTHROPLASTY Left 05/22/2014   Procedure: LEFT TOTAL KNEE ARTHROPLASTY;  Surgeon: Tanda Heading, MD;  Location: [facility]S;  Service: Orthopedics;  Laterality: Left;  TRIGGER FINGER RELEASE Left 07/07/2021   Procedure: LEFT INDEX FINGER RELEASE TRIGGER FINGER/A-1 PULLEY / CARPOMETACARPAL INJECTION;  Surgeon: Romona Harari, MD;  Location: [facility];  Service: Orthopedics;  Laterality: Left;   TUBAL LIGATION  1980     Current Outpatient Medications  Medication Sig Dispense Refill   acetaminophen  (TYLENOL ) 500 MG tablet Take 1,000 mg by mouth every 6 (six) hours as  needed for moderate pain or headache.     albuterol  (VENTOLIN  HFA) 108 (90 Base) MCG/ACT inhaler Inhale 2 puffs into the lungs every 6 (six) hours as needed for wheezing or shortness of breath. 8.5 each 3   aspirin  EC 81 MG tablet Take 81 mg by mouth daily. Swallow whole.     bisoprolol  (ZEBETA ) 5 MG tablet TAKE 1/2 TABLET(2.5 MG) BY MOUTH DAILY 45 tablet 1   Calcium  Polycarbophil (FIBER-CAPS PO) Take 1 capsule by mouth daily.     clopidogrel  (PLAVIX ) 75 MG tablet TAKE 1 TABLET(75 MG) BY MOUTH DAILY 90 tablet 1   COLLAGEN PO Take 1,000 mg by mouth daily.     Cyanocobalamin (VITAMIN B-12) 5000 MCG TBDP Take 5,000 mcg by mouth daily.     dapagliflozin  propanediol (FARXIGA ) 10 MG TABS tablet Take 1 tablet (10 mg total) by mouth daily. 90 tablet 0   diclofenac  sodium (VOLTAREN ) 1 % GEL Apply 2 g topically 4 (four) times daily. (Patient taking differently: Apply 2 g topically daily as needed (pain).) 100 g 0   Homeopathic Products (THERAWORX MUSCLE CRAMPS) FOAM Apply 1 application  topically daily as needed (Muscle cramps).     HYDROcodone -acetaminophen  (NORCO/VICODIN) 5-325 MG tablet Take 1 tablet by mouth every 6 (six) hours as needed for moderate pain (pain score 4-6). Patient needs to schedule office visit 130 tablet 0   isosorbide  mononitrate (IMDUR ) 60 MG 24 hr tablet Take 1.5 tablets (90 mg total) by mouth daily. 135 tablet 1   lipase/protease/amylase (CREON ) 36000 UNITS CPEP capsule Take 3 capsules (108,000 Units total) by mouth 3 (three) times daily with meals. May also take 1 capsule (36,000 Units total) as needed (with snacks). 200 capsule 3   loratadine (CLARITIN) 10 MG tablet Take 10 mg by mouth daily.     metFORMIN  (GLUCOPHAGE ) 1000 MG tablet Take 1 tablet (1,000 mg total) by mouth 2 (two) times daily with a meal. 180 tablet 0   methocarbamol  (ROBAXIN ) 500 MG tablet TAKE 1 TABLET BY MOUTH EVERY 6 HOURS AS NEEDED FOR MUSCLE SPASM 120 tablet 3   Multiple Vitamin (MULTIVITAMIN) tablet Take 1  tablet by mouth daily.     nitroGLYCERIN  (NITROSTAT ) 0.4 MG SL tablet Place 1 tablet under the tongue every 5 (five) minutes as needed for chest pain.     ondansetron  (ZOFRAN ) 4 MG tablet Take 1 tablet (4 mg total) by mouth every 8 (eight) hours as needed for nausea or vomiting. 20 tablet 0   ONETOUCH ULTRA TEST test strip Use to check blood sugars up to 4 times per day. 100 strip 1   Oxymetazoline HCl (NASAL SPRAY NA) Place 1 spray into the nose daily as needed (Congestion/allergies).     pantoprazole  (PROTONIX ) 40 MG tablet TAKE 1 TABLET BY MOUTH EVERY DAY 90 tablet 1   pioglitazone  (ACTOS ) 30 MG tablet TAKE 1 TABLET BY MOUTH EVERY DAY 90 tablet 1   pregabalin  (LYRICA ) 50 MG capsule Take 1 capsule (50 mg total) by mouth 3 (three) times daily. 90 capsule 2   Probiotic Product (TRUBIOTICS PO) Take  1 capsule by mouth daily.     promethazine  (PHENERGAN ) 25 MG tablet TAKE 1 TABLET(25 MG) BY MOUTH EVERY 8 HOURS AS NEEDED FOR NAUSEA OR VOMITING 30 tablet 2   rosuvastatin  (CRESTOR ) 40 MG tablet TAKE 1 TABLET BY MOUTH NIGHTLY 90 tablet 1   sitaGLIPtin  (JANUVIA ) 100 MG tablet Take 1 tablet (100 mg total) by mouth daily. 90 tablet 3   WIXELA INHUB 250-50 MCG/ACT AEPB INHALE 1 PUFF INTO THE LUNGS IN THE MORNING AND AT BEDTIME. 60 each 11   diphenoxylate -atropine  (LOMOTIL ) 2.5-0.025 MG tablet Take 2 tablets by mouth 4 (four) times daily as needed for diarrhea or loose stools. (Patient not taking: Reported on 02/27/2024) 30 tablet 0   No current facility-administered medications for this visit.    Allergies:   Gabapentin , Glucotrol [glipizide], Influenza vaccines, Latex, Nortriptyline , Tetanus toxoid, Other, Prednisone  & diphenhydramine, and Invokana  [canagliflozin ]    Social History:  The patient  reports that she quit smoking about 15 years ago. Her smoking use included cigarettes. She has never used smokeless tobacco. She reports that she does not drink alcohol and does not use drugs.   Family History:   The patient's family history includes Breast cancer (age of onset: 46) in her sister; CVA in her mother; Emphysema in her father.    ROS:  Please see the history of present illness.   Otherwise, review of systems are positive for .   All other systems are reviewed and negative.    PHYSICAL EXAM: VS:  BP (!) 106/56 (BP Location: Right Arm, Patient Position: Sitting, Cuff Size: Normal)   Pulse 83   Ht 5' 2 (1.575 m)   Wt 179 lb (81.2 kg)   SpO2 98%   BMI 32.74 kg/m  , BMI Body mass index is 32.74 kg/m. GEN: Well nourished, well developed, in no acute distress  HEENT: normal  Neck: no JVD, carotid bruits, or masses Cardiac: RRR; no murmurs, rubs, or gallops,no edema  Respiratory:  clear to auscultation bilaterally, normal work of breathing GI: soft, nontender, nondistended, + BS MS: no deformity or atrophy  Skin: warm and dry, no rash Neuro:  Strength and sensation are intact Psych: euthymic mood, full affect Vascular: Femoral pulses: Normal bilaterally.  Distal pulses are palpable.  EKG:  EKG is ordered today. EKG showed: Normal sinus rhythm Low voltage QRS Cannot rule out Anterior infarct , age undetermined When compared with ECG of 26-Sep-2023 09:03, No significant change was found   Recent Labs: 07/07/2023: ALT 16 09/26/2023: BUN 28; Creatinine, Ser 0.98; Hemoglobin 12.4; Platelets 271; Potassium 4.6; Sodium 143    Lipid Panel    Component Value Date/Time   CHOL 141 07/07/2023 1124   TRIG 215 (H) 07/07/2023 1124   HDL 48 (L) 07/07/2023 1124   CHOLHDL 2.9 07/07/2023 1124   VLDL 35 10/11/2019 0219   LDLCALC 65 07/07/2023 1124   LDLDIRECT 50.6 01/02/2014 1003      Wt Readings from Last 3 Encounters:  02/27/24 179 lb (81.2 kg)  11/15/23 182 lb (82.6 kg)  10/24/23 182 lb 6.4 oz (82.7 kg)       ASSESSMENT AND PLAN:  1.  PAD with intermittent claudication: Status post bilateral SFA intervention.  Doppler studies last year showed an ABI of 0.83 bilaterally with  evidence of restenosis in the proximal right popliteal artery.  Recommend continuing medical therapy for now and repeat Doppler studies in June.  I discussed with her the option of proceeding with a supervised exercise therapy  program but she wants to wait until her husband is done with medical procedures.  Will see how her Doppler studies look in June and decide based on that.    2. Coronary artery disease involving native coronary arteries with other forms of angina: Cardiac catheterization in August showed heavily calcified coronary arteries with moderate two-vessel coronary artery disease.  No revascularization was needed.  Her symptoms are stable overall.    3. Hyperlipidemia:  Continue treatment with high-dose rosuvastatin .  Most recent lipid profile showed an LDL of 65.  I discussed the option of adding another medication such as ezetimibe but she already takes too many medications and prefers not to add anything else at the present time.  4. Diabetes mellitus: Managed by primary care physician.  Most recent hemoglobin A1c was 7.3.    Disposition:   Follow-up in 6 months.  Signed,  Deatrice Cage, MD  02/27/2024 9:54 AM    [facility] "

## 2024-02-27 NOTE — Patient Instructions (Signed)
 Medication Instructions:  No changes *If you need a refill on your cardiac medications before your next appointment, please call your pharmacy*  Lab Work: None ordered If you have labs (blood work) drawn today and your tests are completely normal, you will receive your results only by: MyChart Message (if you have MyChart) OR A paper copy in the mail If you have any lab test that is abnormal or we need to change your treatment, we will call you to review the results.  Testing/Procedures: None ordered  Follow-Up: At [facility], you and your health needs are our priority.  As part of our continuing mission to provide you with exceptional heart care, our providers are all part of one team.  This team includes your primary Cardiologist (physician) and Advanced Practice Providers or APPs (Physician Assistants and Nurse Practitioners) who all work together to provide you with the care you need, when you need it.  Your next appointment:   6 month(s)  Provider:   Antionette Kirks, MD    We recommend signing up for the patient portal called "MyChart".  Sign up information is provided on this After Visit Summary.  MyChart is used to connect with patients for Virtual Visits (Telemedicine).  Patients are able to view lab/test results, encounter notes, upcoming appointments, etc.  Non-urgent messages can be sent to your provider as well.   To learn more about what you can do with MyChart, go to [email or web address].

## 2024-03-03 ENCOUNTER — Other Ambulatory Visit: Payer: Self-pay | Admitting: Family Medicine

## 2024-03-12 ENCOUNTER — Other Ambulatory Visit: Payer: Self-pay | Admitting: Family Medicine

## 2024-03-12 ENCOUNTER — Telehealth: Payer: Self-pay

## 2024-03-12 DIAGNOSIS — M79604 Pain in right leg: Secondary | ICD-10-CM

## 2024-03-12 MED ORDER — METHOCARBAMOL 500 MG PO TABS: ORAL_TABLET | ORAL | 3 refills | Status: AC

## 2024-03-12 NOTE — Telephone Encounter (Signed)
 Prescription Request  03/12/2024  LOV: 10/24/23  What is the name of the medication or equipment? methocarbamol  (ROBAXIN ) 500 MG tablet [[id number]]   Have you contacted your pharmacy to request a refill? Yes   Which pharmacy would you like this sent to?  [id number] [place], [place] - [address] [place] [address] Phone: [phone] Fax: [phone]    Patient notified that their request is being sent to the clinical staff for review and that they should receive a response within 2 business days.   Please advise at [facility] [phone]

## 2024-03-18 ENCOUNTER — Other Ambulatory Visit: Payer: Self-pay | Admitting: Family Medicine

## 2024-03-18 NOTE — Telephone Encounter (Unsigned)
 Copied from CRM #[id number]. Topic: Clinical - Medication Refill >> Mar 18, 2024  9:23 AM Donna BRAVO wrote: Medication:  HYDROcodone -acetaminophen  (NORCO/VICODIN) 5-325 MG tablet dapagliflozin  propanediol (FARXIGA ) 10 MG TABS tablet   Has the patient contacted their pharmacy? Yes Pharmacy stated to call provider  This is the patient's preferred pharmacy:   [id number] [place], [place] - [address] [place] [address] Phone: [phone] Fax: [phone]  Is this the correct pharmacy for this prescription? Yes If no, delete pharmacy and type the correct one.   Has the prescription been filled recently? Yes  Is the patient out of the medication? Yes  Has the patient been seen for an appointment in the last year OR does the patient have an upcoming appointment? Yes  Can we respond through MyChart? No  Agent: Please be advised that Rx refills may take up to 3 business days. We ask that you follow-up with your pharmacy.

## 2024-03-20 ENCOUNTER — Other Ambulatory Visit: Payer: Self-pay | Admitting: Family Medicine

## 2024-03-20 DIAGNOSIS — E1159 Type 2 diabetes mellitus with other circulatory complications: Secondary | ICD-10-CM

## 2024-03-20 MED ORDER — HYDROCODONE-ACETAMINOPHEN 5-325 MG PO TABS: 1.0000 | ORAL_TABLET | Freq: Four times a day (QID) | ORAL | 0 refills | Status: AC | PRN

## 2024-03-20 MED ORDER — DAPAGLIFLOZIN PROPANEDIOL 10 MG PO TABS: 10.0000 mg | ORAL_TABLET | Freq: Every day | ORAL | 0 refills | Status: AC

## 2024-04-01 ENCOUNTER — Ambulatory Visit: Admitting: Family Medicine

## 2024-08-06 ENCOUNTER — Ambulatory Visit: Admitting: Cardiovascular Disease

## 2024-11-20 ENCOUNTER — Ambulatory Visit
